# Patient Record
Sex: Male | Born: 1955 | Race: White | Hispanic: No | Marital: Married | Smoking: Former smoker
Health system: Southern US, Community
[De-identification: ages and names within clinical notes are randomized; demographics above are authoritative.]

## PROBLEM LIST (undated history)

## (undated) DIAGNOSIS — F32A Depression, unspecified: Secondary | ICD-10-CM

## (undated) DIAGNOSIS — E785 Hyperlipidemia, unspecified: Secondary | ICD-10-CM

## (undated) DIAGNOSIS — C8590 Non-Hodgkin lymphoma, unspecified, unspecified site: Principal | ICD-10-CM

## (undated) DIAGNOSIS — K219 Gastro-esophageal reflux disease without esophagitis: Secondary | ICD-10-CM

## (undated) DIAGNOSIS — K7460 Unspecified cirrhosis of liver: Secondary | ICD-10-CM

## (undated) DIAGNOSIS — K429 Umbilical hernia without obstruction or gangrene: Secondary | ICD-10-CM

## (undated) DIAGNOSIS — I739 Peripheral vascular disease, unspecified: Secondary | ICD-10-CM

## (undated) DIAGNOSIS — R519 Headache, unspecified: Secondary | ICD-10-CM

## (undated) DIAGNOSIS — F419 Anxiety disorder, unspecified: Secondary | ICD-10-CM

## (undated) DIAGNOSIS — G629 Polyneuropathy, unspecified: Secondary | ICD-10-CM

## (undated) DIAGNOSIS — I10 Essential (primary) hypertension: Secondary | ICD-10-CM

## (undated) DIAGNOSIS — I2510 Atherosclerotic heart disease of native coronary artery without angina pectoris: Secondary | ICD-10-CM

## (undated) DIAGNOSIS — R760 Raised antibody titer: Secondary | ICD-10-CM

## (undated) DIAGNOSIS — R202 Paresthesia of skin: Secondary | ICD-10-CM

## (undated) DIAGNOSIS — K5792 Diverticulitis of intestine, part unspecified, without perforation or abscess without bleeding: Secondary | ICD-10-CM

## (undated) DIAGNOSIS — Z950 Presence of cardiac pacemaker: Secondary | ICD-10-CM

## (undated) DIAGNOSIS — E119 Type 2 diabetes mellitus without complications: Secondary | ICD-10-CM

## (undated) DIAGNOSIS — I350 Nonrheumatic aortic (valve) stenosis: Secondary | ICD-10-CM

## (undated) DIAGNOSIS — I453 Trifascicular block: Secondary | ICD-10-CM

## (undated) DIAGNOSIS — I4891 Unspecified atrial fibrillation: Secondary | ICD-10-CM

## (undated) DIAGNOSIS — G4730 Sleep apnea, unspecified: Secondary | ICD-10-CM

## (undated) DIAGNOSIS — I509 Heart failure, unspecified: Secondary | ICD-10-CM

## (undated) DIAGNOSIS — M1990 Unspecified osteoarthritis, unspecified site: Secondary | ICD-10-CM

## (undated) DIAGNOSIS — K760 Fatty (change of) liver, not elsewhere classified: Secondary | ICD-10-CM

## (undated) DIAGNOSIS — I219 Acute myocardial infarction, unspecified: Secondary | ICD-10-CM

## (undated) DIAGNOSIS — G8929 Other chronic pain: Secondary | ICD-10-CM

## (undated) DIAGNOSIS — D696 Thrombocytopenia, unspecified: Secondary | ICD-10-CM

## (undated) DIAGNOSIS — D649 Anemia, unspecified: Secondary | ICD-10-CM

## (undated) DIAGNOSIS — Z95810 Presence of automatic (implantable) cardiac defibrillator: Secondary | ICD-10-CM

## (undated) HISTORY — DX: Presence of cardiac pacemaker: Z95.0

## (undated) HISTORY — DX: Unspecified cirrhosis of liver: K74.60

## (undated) HISTORY — DX: Non-Hodgkin lymphoma, unspecified, unspecified site: C85.90

## (undated) HISTORY — PX: BACK SURGERY: SHX140

## (undated) HISTORY — DX: Other chronic pain: G89.29

## (undated) HISTORY — PX: OTHER SURGICAL HISTORY: SHX169

## (undated) HISTORY — PX: ROUX-EN-Y GASTRIC BYPASS: SHX1104

## (undated) HISTORY — DX: Paresthesia of skin: R20.2

## (undated) HISTORY — DX: Thrombocytopenia, unspecified: D69.6

## (undated) HISTORY — DX: Atherosclerotic heart disease of native coronary artery without angina pectoris: I25.10

## (undated) HISTORY — DX: Unspecified atrial fibrillation: I48.91

## (undated) HISTORY — DX: Essential (primary) hypertension: I10

## (undated) HISTORY — PX: CATARACT EXTRACTION: SUR2

## (undated) HISTORY — DX: Hyperlipidemia, unspecified: E78.5

## (undated) HISTORY — DX: Polyneuropathy, unspecified: G62.9

## (undated) HISTORY — PX: SHOULDER ARTHROSCOPY: SHX128

## (undated) HISTORY — DX: Type 2 diabetes mellitus without complications: E11.9

## (undated) HISTORY — DX: Acute myocardial infarction, unspecified: I21.9

## (undated) HISTORY — PX: NECK SURGERY: SHX720

## (undated) HISTORY — PX: CARDIAC CATHETERIZATION: SHX172

## (undated) HISTORY — DX: Gastro-esophageal reflux disease without esophagitis: K21.9

## (undated) HISTORY — DX: Raised antibody titer: R76.0

## (undated) HISTORY — DX: Trifascicular block: I45.3

---

## 1999-01-07 ENCOUNTER — Inpatient Hospital Stay (HOSPITAL_COMMUNITY): Admission: AD | Admit: 1999-01-07 | Discharge: 1999-01-10 | Payer: Self-pay | Admitting: Cardiology

## 1999-05-20 DIAGNOSIS — I219 Acute myocardial infarction, unspecified: Secondary | ICD-10-CM

## 1999-05-20 HISTORY — DX: Acute myocardial infarction, unspecified: I21.9

## 1999-07-12 ENCOUNTER — Ambulatory Visit (HOSPITAL_COMMUNITY): Admission: RE | Admit: 1999-07-12 | Discharge: 1999-07-13 | Payer: Self-pay | Admitting: Cardiology

## 2000-03-08 ENCOUNTER — Inpatient Hospital Stay (HOSPITAL_COMMUNITY): Admission: EM | Admit: 2000-03-08 | Discharge: 2000-03-10 | Payer: Self-pay | Admitting: Cardiology

## 2001-01-20 ENCOUNTER — Ambulatory Visit (HOSPITAL_COMMUNITY): Admission: RE | Admit: 2001-01-20 | Discharge: 2001-01-20 | Payer: Self-pay | Admitting: Pulmonary Disease

## 2001-12-20 ENCOUNTER — Ambulatory Visit (HOSPITAL_COMMUNITY): Admission: RE | Admit: 2001-12-20 | Discharge: 2001-12-20 | Payer: Self-pay | Admitting: Pulmonary Disease

## 2003-08-23 ENCOUNTER — Ambulatory Visit (HOSPITAL_COMMUNITY): Admission: RE | Admit: 2003-08-23 | Discharge: 2003-08-23 | Payer: Self-pay | Admitting: Pulmonary Disease

## 2003-09-07 ENCOUNTER — Ambulatory Visit (HOSPITAL_COMMUNITY): Admission: RE | Admit: 2003-09-07 | Discharge: 2003-09-07 | Payer: Self-pay | Admitting: Pulmonary Disease

## 2004-05-23 ENCOUNTER — Ambulatory Visit: Payer: Self-pay | Admitting: Orthopedic Surgery

## 2004-06-24 ENCOUNTER — Ambulatory Visit: Payer: Self-pay | Admitting: Orthopedic Surgery

## 2005-01-15 ENCOUNTER — Ambulatory Visit (HOSPITAL_COMMUNITY): Admission: RE | Admit: 2005-01-15 | Discharge: 2005-01-15 | Payer: Self-pay | Admitting: Pulmonary Disease

## 2005-05-19 DIAGNOSIS — C8590 Non-Hodgkin lymphoma, unspecified, unspecified site: Secondary | ICD-10-CM

## 2005-05-19 HISTORY — DX: Non-Hodgkin lymphoma, unspecified, unspecified site: C85.90

## 2005-07-08 ENCOUNTER — Ambulatory Visit (HOSPITAL_COMMUNITY): Admission: RE | Admit: 2005-07-08 | Discharge: 2005-07-08 | Payer: Self-pay | Admitting: Pulmonary Disease

## 2005-07-21 ENCOUNTER — Ambulatory Visit (HOSPITAL_COMMUNITY): Admission: RE | Admit: 2005-07-21 | Discharge: 2005-07-21 | Payer: Self-pay | Admitting: General Surgery

## 2005-09-15 ENCOUNTER — Ambulatory Visit (HOSPITAL_COMMUNITY): Admission: RE | Admit: 2005-09-15 | Discharge: 2005-09-15 | Payer: Self-pay | Admitting: General Surgery

## 2005-09-15 ENCOUNTER — Encounter (INDEPENDENT_AMBULATORY_CARE_PROVIDER_SITE_OTHER): Payer: Self-pay | Admitting: *Deleted

## 2005-09-30 ENCOUNTER — Ambulatory Visit (HOSPITAL_COMMUNITY): Admission: RE | Admit: 2005-09-30 | Discharge: 2005-09-30 | Payer: Self-pay | Admitting: General Surgery

## 2005-10-06 ENCOUNTER — Ambulatory Visit (HOSPITAL_COMMUNITY): Payer: Self-pay | Admitting: Oncology

## 2005-10-06 ENCOUNTER — Encounter: Admission: RE | Admit: 2005-10-06 | Discharge: 2005-10-06 | Payer: Self-pay | Admitting: Oncology

## 2005-10-06 ENCOUNTER — Encounter (HOSPITAL_COMMUNITY): Admission: RE | Admit: 2005-10-06 | Discharge: 2005-11-05 | Payer: Self-pay | Admitting: Oncology

## 2005-10-10 ENCOUNTER — Ambulatory Visit (HOSPITAL_COMMUNITY): Admission: RE | Admit: 2005-10-10 | Discharge: 2005-10-10 | Payer: Self-pay | Admitting: Oncology

## 2005-11-20 ENCOUNTER — Encounter (HOSPITAL_COMMUNITY): Admission: RE | Admit: 2005-11-20 | Discharge: 2005-12-20 | Payer: Self-pay | Admitting: Oncology

## 2005-11-20 ENCOUNTER — Encounter: Admission: RE | Admit: 2005-11-20 | Discharge: 2005-11-20 | Payer: Self-pay | Admitting: Oncology

## 2005-12-30 ENCOUNTER — Encounter: Admission: RE | Admit: 2005-12-30 | Discharge: 2005-12-30 | Payer: Self-pay | Admitting: Oncology

## 2005-12-30 ENCOUNTER — Encounter (HOSPITAL_COMMUNITY): Admission: RE | Admit: 2005-12-30 | Discharge: 2006-01-29 | Payer: Self-pay | Admitting: Oncology

## 2005-12-30 ENCOUNTER — Ambulatory Visit (HOSPITAL_COMMUNITY): Payer: Self-pay | Admitting: Oncology

## 2006-03-02 ENCOUNTER — Encounter (HOSPITAL_COMMUNITY): Admission: RE | Admit: 2006-03-02 | Discharge: 2006-04-01 | Payer: Self-pay | Admitting: Oncology

## 2006-03-02 ENCOUNTER — Encounter: Admission: RE | Admit: 2006-03-02 | Discharge: 2006-03-02 | Payer: Self-pay | Admitting: Oncology

## 2006-03-02 ENCOUNTER — Ambulatory Visit (HOSPITAL_COMMUNITY): Payer: Self-pay | Admitting: Oncology

## 2006-04-28 ENCOUNTER — Ambulatory Visit: Payer: Self-pay | Admitting: Gastroenterology

## 2006-05-05 ENCOUNTER — Ambulatory Visit (HOSPITAL_COMMUNITY): Admission: RE | Admit: 2006-05-05 | Discharge: 2006-05-05 | Payer: Self-pay

## 2006-07-10 ENCOUNTER — Encounter (HOSPITAL_COMMUNITY): Admission: RE | Admit: 2006-07-10 | Discharge: 2006-08-09 | Payer: Self-pay | Admitting: Oncology

## 2006-07-10 ENCOUNTER — Ambulatory Visit (HOSPITAL_COMMUNITY): Payer: Self-pay | Admitting: Oncology

## 2006-10-01 ENCOUNTER — Encounter (HOSPITAL_COMMUNITY): Admission: RE | Admit: 2006-10-01 | Discharge: 2006-10-31 | Payer: Self-pay | Admitting: Oncology

## 2006-10-05 ENCOUNTER — Ambulatory Visit (HOSPITAL_COMMUNITY): Payer: Self-pay | Admitting: Oncology

## 2006-10-18 HISTORY — PX: ESOPHAGOGASTRODUODENOSCOPY: SHX1529

## 2006-10-18 HISTORY — PX: COLONOSCOPY: SHX174

## 2006-11-04 ENCOUNTER — Ambulatory Visit: Payer: Self-pay | Admitting: Cardiology

## 2006-11-10 ENCOUNTER — Ambulatory Visit: Payer: Self-pay | Admitting: Internal Medicine

## 2006-11-12 ENCOUNTER — Encounter (HOSPITAL_COMMUNITY): Admission: RE | Admit: 2006-11-12 | Discharge: 2006-12-12 | Payer: Self-pay | Admitting: Cardiology

## 2006-11-12 ENCOUNTER — Ambulatory Visit (HOSPITAL_COMMUNITY): Admission: RE | Admit: 2006-11-12 | Discharge: 2006-11-12 | Payer: Self-pay | Admitting: Internal Medicine

## 2006-11-12 ENCOUNTER — Ambulatory Visit: Payer: Self-pay | Admitting: Internal Medicine

## 2006-11-16 ENCOUNTER — Encounter (INDEPENDENT_AMBULATORY_CARE_PROVIDER_SITE_OTHER): Payer: Self-pay | Admitting: *Deleted

## 2006-11-16 LAB — CONVERTED CEMR LAB
ALT: 20 units/L
Albumin: 4.5 g/dL
Cholesterol: 94 mg/dL
Triglycerides: 151 mg/dL

## 2006-12-09 ENCOUNTER — Ambulatory Visit: Payer: Self-pay | Admitting: Cardiovascular Disease

## 2006-12-18 HISTORY — PX: ESOPHAGOGASTRODUODENOSCOPY: SHX1529

## 2007-02-08 ENCOUNTER — Encounter (HOSPITAL_COMMUNITY): Admission: RE | Admit: 2007-02-08 | Discharge: 2007-03-10 | Payer: Self-pay | Admitting: Oncology

## 2007-02-08 ENCOUNTER — Ambulatory Visit (HOSPITAL_COMMUNITY): Payer: Self-pay | Admitting: Oncology

## 2007-03-30 ENCOUNTER — Ambulatory Visit (HOSPITAL_COMMUNITY): Payer: Self-pay | Admitting: Oncology

## 2007-08-24 ENCOUNTER — Ambulatory Visit (HOSPITAL_COMMUNITY): Payer: Self-pay | Admitting: Oncology

## 2007-08-24 ENCOUNTER — Encounter (HOSPITAL_COMMUNITY): Admission: RE | Admit: 2007-08-24 | Discharge: 2007-09-23 | Payer: Self-pay | Admitting: Oncology

## 2008-02-22 ENCOUNTER — Ambulatory Visit (HOSPITAL_COMMUNITY): Payer: Self-pay | Admitting: Oncology

## 2008-02-22 ENCOUNTER — Encounter (HOSPITAL_COMMUNITY): Admission: RE | Admit: 2008-02-22 | Discharge: 2008-03-23 | Payer: Self-pay | Admitting: Oncology

## 2008-08-17 ENCOUNTER — Ambulatory Visit (HOSPITAL_COMMUNITY): Payer: Self-pay | Admitting: Oncology

## 2008-08-17 ENCOUNTER — Encounter (HOSPITAL_COMMUNITY): Admission: RE | Admit: 2008-08-17 | Discharge: 2008-09-16 | Payer: Self-pay | Admitting: Oncology

## 2008-10-28 ENCOUNTER — Emergency Department (HOSPITAL_COMMUNITY): Admission: EM | Admit: 2008-10-28 | Discharge: 2008-10-29 | Payer: Self-pay | Admitting: Emergency Medicine

## 2008-10-29 ENCOUNTER — Emergency Department (HOSPITAL_COMMUNITY): Admission: EM | Admit: 2008-10-29 | Discharge: 2008-10-29 | Payer: Self-pay | Admitting: Emergency Medicine

## 2008-10-29 ENCOUNTER — Ambulatory Visit (HOSPITAL_COMMUNITY): Admission: RE | Admit: 2008-10-29 | Discharge: 2008-10-29 | Payer: Self-pay | Admitting: Emergency Medicine

## 2009-02-22 ENCOUNTER — Encounter (HOSPITAL_COMMUNITY): Admission: RE | Admit: 2009-02-22 | Discharge: 2009-03-24 | Payer: Self-pay | Admitting: Oncology

## 2009-02-22 ENCOUNTER — Ambulatory Visit (HOSPITAL_COMMUNITY): Payer: Self-pay | Admitting: Oncology

## 2009-04-04 ENCOUNTER — Ambulatory Visit (HOSPITAL_COMMUNITY): Payer: Self-pay | Admitting: Oncology

## 2009-04-06 ENCOUNTER — Ambulatory Visit (HOSPITAL_COMMUNITY): Admission: RE | Admit: 2009-04-06 | Discharge: 2009-04-06 | Payer: Self-pay | Admitting: General Surgery

## 2009-04-10 ENCOUNTER — Encounter: Admission: RE | Admit: 2009-04-10 | Discharge: 2009-04-10 | Payer: Self-pay | Admitting: Orthopaedic Surgery

## 2009-05-22 ENCOUNTER — Ambulatory Visit: Payer: Self-pay | Admitting: Cardiology

## 2009-05-22 ENCOUNTER — Encounter (INDEPENDENT_AMBULATORY_CARE_PROVIDER_SITE_OTHER): Payer: Self-pay | Admitting: *Deleted

## 2009-05-22 DIAGNOSIS — I2510 Atherosclerotic heart disease of native coronary artery without angina pectoris: Secondary | ICD-10-CM | POA: Insufficient documentation

## 2009-05-22 DIAGNOSIS — E782 Mixed hyperlipidemia: Secondary | ICD-10-CM

## 2009-05-22 DIAGNOSIS — I10 Essential (primary) hypertension: Secondary | ICD-10-CM

## 2009-05-22 DIAGNOSIS — I25119 Atherosclerotic heart disease of native coronary artery with unspecified angina pectoris: Secondary | ICD-10-CM | POA: Insufficient documentation

## 2009-05-22 DIAGNOSIS — E1159 Type 2 diabetes mellitus with other circulatory complications: Secondary | ICD-10-CM | POA: Insufficient documentation

## 2009-05-30 ENCOUNTER — Ambulatory Visit: Payer: Self-pay | Admitting: Cardiology

## 2009-05-30 ENCOUNTER — Encounter: Payer: Self-pay | Admitting: Cardiology

## 2009-05-30 ENCOUNTER — Encounter (HOSPITAL_COMMUNITY): Admission: RE | Admit: 2009-05-30 | Discharge: 2009-06-29 | Payer: Self-pay | Admitting: Cardiology

## 2009-06-04 ENCOUNTER — Encounter: Payer: Self-pay | Admitting: Cardiology

## 2009-06-05 ENCOUNTER — Encounter: Payer: Self-pay | Admitting: Cardiology

## 2009-06-13 ENCOUNTER — Ambulatory Visit: Payer: Self-pay | Admitting: Cardiology

## 2009-06-29 ENCOUNTER — Ambulatory Visit (HOSPITAL_COMMUNITY): Admission: RE | Admit: 2009-06-29 | Discharge: 2009-06-30 | Payer: Self-pay | Admitting: Orthopaedic Surgery

## 2009-10-08 ENCOUNTER — Ambulatory Visit (HOSPITAL_COMMUNITY): Payer: Self-pay | Admitting: Oncology

## 2009-10-08 ENCOUNTER — Encounter (HOSPITAL_COMMUNITY): Admission: RE | Admit: 2009-10-08 | Discharge: 2009-11-07 | Payer: Self-pay | Admitting: Oncology

## 2009-10-31 ENCOUNTER — Encounter (HOSPITAL_COMMUNITY): Admission: RE | Admit: 2009-10-31 | Discharge: 2009-11-30 | Payer: Self-pay | Admitting: Oncology

## 2009-12-04 ENCOUNTER — Encounter (HOSPITAL_COMMUNITY): Admission: RE | Admit: 2009-12-04 | Discharge: 2010-01-03 | Payer: Self-pay | Admitting: Oncology

## 2009-12-19 ENCOUNTER — Ambulatory Visit: Payer: Self-pay | Admitting: Cardiology

## 2009-12-20 ENCOUNTER — Encounter: Payer: Self-pay | Admitting: Cardiology

## 2009-12-31 ENCOUNTER — Encounter: Admission: RE | Admit: 2009-12-31 | Discharge: 2010-03-31 | Payer: Self-pay | Admitting: Oncology

## 2009-12-31 ENCOUNTER — Encounter: Admission: RE | Admit: 2009-12-31 | Discharge: 2009-12-31 | Payer: Self-pay | Admitting: Oncology

## 2010-02-25 ENCOUNTER — Encounter
Admission: RE | Admit: 2010-02-25 | Discharge: 2010-02-25 | Payer: Self-pay | Source: Home / Self Care | Attending: Oncology | Admitting: Oncology

## 2010-04-19 ENCOUNTER — Encounter (HOSPITAL_COMMUNITY)
Admission: RE | Admit: 2010-04-19 | Discharge: 2010-05-19 | Payer: Self-pay | Source: Home / Self Care | Attending: Oncology | Admitting: Oncology

## 2010-04-19 ENCOUNTER — Ambulatory Visit (HOSPITAL_COMMUNITY): Payer: Self-pay | Admitting: Oncology

## 2010-05-07 ENCOUNTER — Encounter
Admission: RE | Admit: 2010-05-07 | Discharge: 2010-06-18 | Payer: Self-pay | Source: Home / Self Care | Attending: Oncology | Admitting: Oncology

## 2010-06-09 ENCOUNTER — Encounter (HOSPITAL_COMMUNITY): Payer: Self-pay | Admitting: Oncology

## 2010-06-16 LAB — CONVERTED CEMR LAB
Albumin: 4.3 g/dL (ref 3.5–5.2)
Bilirubin, Direct: 0.2 mg/dL (ref 0.0–0.3)
Bilirubin, Direct: 0.2 mg/dL (ref 0.0–0.3)
HDL: 39 mg/dL — ABNORMAL LOW (ref >39)
HDL: 41 mg/dL (ref >39)
Indirect Bilirubin: 0.4 mg/dL (ref 0.0–0.9)
Indirect Bilirubin: 0.6 mg/dL (ref 0.0–0.9)
LDL Cholesterol: 31 mg/dL (ref 0–99)
LDL Cholesterol: 39 mg/dL (ref 0–99)
Total Bilirubin: 0.6 mg/dL (ref 0.3–1.2)
Total Bilirubin: 0.8 mg/dL (ref 0.3–1.2)
Total CHOL/HDL Ratio: 2.8
Triglycerides: 196 mg/dL — ABNORMAL HIGH (ref <150)
VLDL: 36 mg/dL (ref 0–40)

## 2010-06-20 NOTE — Letter (Signed)
Summary: [name] at [employer]  [address], [place] [address]   Phone: [phone]  Fax: [phone]     December 19, 2009 MRN: [id number]   Kelly [name] [address] [place], [place]  [address]      YOUR LAB WORK IS DUE  The week of June 17, 2010 _________________________________________  Please go to [facility], located across the street from [facility] on the second floor.  Hours are Monday - Friday 7am until 7:30pm         Saturday 8am until 12noon    __  DO NOT EAT OR DRINK AFTER MIDNIGHT EVENING PRIOR TO LABWORK  __ YOUR LABWORK IS NOT FASTING --YOU MAY EAT PRIOR TO LABWORK

## 2010-06-20 NOTE — Assessment & Plan Note (Signed)
Summary: SURG CLEARANCE  Medications Added ACTOS 45 MG TABS (PIOGLITAZONE HCL) take 1 tab daily SIMVASTATIN 40 MG TABS (SIMVASTATIN) take 1 tab daily METFORMIN HCL 500 MG TABS (METFORMIN HCL) take 2 tabs two times a day GLYBURIDE 5 MG TABS (GLYBURIDE) take 2 tabs two times a day FUROSEMIDE 20 MG TABS (FUROSEMIDE) take 1 tab daily LOPRESSOR 50 MG TABS (METOPROLOL TARTRATE) take 1/2 two times a day PRILOSEC 20 MG CPDR (OMEPRAZOLE) take 1 tab two times a day ALPRAZOLAM 1 MG TABS (ALPRAZOLAM) take 2 tabs at bedtime ALDACTONE 50 MG TABS (SPIRONOLACTONE) take 1 tab two times a day RAMIPRIL 5 MG CAPS (RAMIPRIL) take 1 tab daily ASPIR-LOW 81 MG TBEC (ASPIRIN) take 1 tab daily NITROSTAT 0.4 MG SUBL (NITROGLYCERIN) take as needed      Allergies Added: NKDA  Visit Type:  Follow-up Referring Provider:  Dr. Annell Greening Primary Provider:  Dr. Kari Baars   History of Present Illness: This is my first meeting with Kelly [name]. He is a 55 year old male with complex medical illness outlined below, including cardiovascular disease, and remote percutaneous intervention. He has been followed over the years in our office by Dr. Jens Som and Dr. Eden Emms, although not seen since 2008.  The patient is being considered for left rotator cuff surgery under general anesthesia and is referred for a preoperative assessment.  Kelly [name] indicates that over the last 6 months he has had less energy and increased fatigue. He does not report any typical exertional chest pain, only "2" brief episodes of chest pain of atypical description, sporadic overall. She reports compliance with his medications which are outlined below. He has not had regular followup with Dr. Juanetta Gosling, and cannot recall his last lipid profile.  Kelly [name] is followed by Dr. Mariel Sleet with a history of lymphoma, not requiring any specific therapy by his report. The patient also has a history of left lower extremity deep venous thrombosis in June of  2010, transiently treated with Coumadin. The patient's understanding is that he may require Coumadin long-term at some point.  Preventive Screening-Counseling & Management  Alcohol-Tobacco     Smoking Status: quit  Current Medications (verified): 1)  Actos 45 Mg Tabs (Pioglitazone Hcl) .... Take 1 Tab Daily 2)  Simvastatin 40 Mg Tabs (Simvastatin) .... Take 1 Tab Daily 3)  Metformin Hcl 500 Mg Tabs (Metformin Hcl) .... Take 2 Tabs Two Times A Day 4)  Glyburide 5 Mg Tabs (Glyburide) .... Take 2 Tabs Two Times A Day 5)  Furosemide 20 Mg Tabs (Furosemide) .... Take 1 Tab Daily 6)  Lopressor 50 Mg Tabs (Metoprolol Tartrate) .... Take 1/2 Two Times A Day 7)  Prilosec 20 Mg Cpdr (Omeprazole) .... Take 1 Tab Two Times A Day 8)  Alprazolam 1 Mg Tabs (Alprazolam) .... Take 2 Tabs At Bedtime 9)  Aldactone 50 Mg Tabs (Spironolactone) .... Take 1 Tab Two Times A Day 10)  Ramipril 5 Mg Caps (Ramipril) .... Take 1 Tab Daily 11)  Aspir-Low 81 Mg Tbec (Aspirin) .... Take 1 Tab Daily 12)  Nitrostat 0.4 Mg Subl (Nitroglycerin) .... Take As Needed  Allergies (verified): No Known Drug Allergies  Past History:  Past Medical History: Last updated: 05/22/2009 CAD - BMS LAD and PTCA RCA 2000, BMS RCA/PLA 2001 Hypertension Diabetes Type 2 Myocardial Infarction - 2001 Hyperlipidemia Non-Hodgkin's lymphoma G E R D  Past Surgical History: Last updated: 05/22/2009 Right inguinal lymph node biopsy Multiple back surgeries Neck surgeries Incision and drainage left axillary abscess  Clinical  Review Panels:  Nuclear Stress Testing Nuclear Stress Test Findings  IMPRESSION:   1.  Adenosine Myoview electrically negative for ischemia.  Myoview   scan with small areas of ischemia in the distal   inferior/inferolateral region as well as basal inferoseptal region.   Inferior changes also reflect scar and some possible soft tissue   attenuation.  LVF on gating calculated at 50%.   2.  On comparison to  Myoview report from 2001, does not appear to be   significantly different, but images are not available for comparison. (11/12/2006)  Cardiac Imaging Cardiac Cath Findings CONCLUSIONS: 1. Coronary artery disease, status post prior stenting of the proximal    left anterior descending and prior percutaneous transluminal coronary    angioplasty of the right coronary artery in August of 2000 with 30%    narrowing in the stent site in the left anterior descending and 40%    proximal narrowing in the native left anterior descending, 30% narrowing    within the proximal stents in the right coronary artery, 70% stenosis in the  mid right coronary artery (new), 80% narrowing in the ostium of the    posterior descending branch (old), and 90% restenosis in the second    posterolateral branch of the right coronary artery with 70% stenosis in the    first posterolateral branch and normal left ventricular function. 2. Successful stenting of the mid right coronary artery with improvement in    percent diameter narrowing from 70% to less than 10%. 3. Successful stenting of the first posterolateral branch of the right     coronary artery with improvement in the percent diameter narrowing from    70% to 10% and suboptimal result in the second posterolateral branch    which is a side branch off the first posterolateral branch with improvement    in percent diameter narrowing from 90% to 50%.  DISPOSITION:  The patient was returned to the postangioplasty for further observation. DD:  07/12/99 TD:  07/13/99 Job: [id number] (07/12/1999)    Family History: Noncontributory  Social History: Married  Tobacco Use - Former Alcohol Use - no Smoking Status:  quit  Review of Systems       The patient complains of weight gain, dyspnea on exertion, and peripheral edema.  The patient denies syncope, prolonged cough, headaches, hemoptysis, abdominal pain, melena, hematochezia, and severe  indigestion/heartburn.         He does not exercise regularly at this point, complaining of chronic knee pain. He wears compression hose below the knees bilaterally on a regular basis. No palpitations or syncope. No orthopnea or PND. No fevers or chills. Otherwise reviewed and negative.  Vital Signs:  Patient profile:   55 year old male Height:      76 inches Weight:      308 pounds BMI:     37.63 Pulse rate:   72 / minute BP sitting:   124 / 74  (right arm)  Vitals Entered By: Dreama Saa, CNA (May 22, 2009 12:46 PM)  Physical Exam  Additional Exam:  Morbidly obese male in no acute distress. HEENT: Conjunctiva and lids normal, oropharynx with poor dentition. Neck: Increased girth, no obvious elevated jugular venous pressure or carotid bruits. No thyromegaly. Lungs: Clear to auscultation, nonlabored. Cardiac: Indistinct PMI, regular rate and rhythm, soft apical systolic murmur. No S3 gallop. Abdomen: Obese, unable to palpate liver edge, bowel sounds present, no bruits. Extremities: Compression hose in place bilaterally 1+ distal pulses.  Skin: Warm and dry. Musculoskeletal: No gross deformities. Neuropsychiatric: Alert and oriented x3, affect appropriate.   EKG  Procedure date:  05/22/2009  Findings:      Probable sinus rhythm at 71 beats per minute with right bundle branch block and left anterior fascicular block. No old tracing available for comparison at this time.  Impression & Recommendations:  Problem # 1:  PREOPERATIVE EXAMINATION (ICD-V72.84)  Patient being considered for elective left rotator cuff surgery under general anesthesia. He has a known cardiovascular history as detailed above. Last ischemic evaluation was over 2 years ago, and symptomatically he has been experiencing progressive fatigue and weakness over the last 6 months, although with very atypical, infrequent chest pain. His electrocardiogram is abnormal with right bundle branch block and left  anterior fascicular block (no old tracing at hand). We have discussed the situation and I have recommended a followup echocardiogram as well as Lexiscan Myoview for further risk stratification. I will bring him back to the office to review these studies.  Problem # 2:  CORONARY ATHEROSCLEROSIS NATIVE CORONARY ARTERY (ICD-414.01)  Remote history of myocardial infarction in 2001 with previous intervention including bare-metal stent placement to the LAD and angioplasty to the RCA in 2000, followed by bare-metal stent placement to the RCA and PLB in 2001. Myoview 2008 revealed mild ischemia in the inferolateral and inferoseptal wall with ejection fraction of 50%, managed medically at that time. Medical therapy looks fairly reasonable at this point.  His updated medication list for this problem includes:    Lopressor 50 Mg Tabs (Metoprolol tartrate) .Marland Kitchen... Take 1/2 two times a day    Ramipril 5 Mg Caps (Ramipril) .Marland Kitchen... Take 1 tab daily    Aspir-low 81 Mg Tbec (Aspirin) .Marland Kitchen... Take 1 tab daily    Nitrostat 0.4 Mg Subl (Nitroglycerin) .Marland Kitchen... Take as needed  Orders: 2-D Echocardiogram (2D Echo) Nuclear Stress Test (Nuc Stress Test)  Problem # 3:  HYPERTENSION (ICD-401.9)  Blood pressure well controlled today.  His updated medication list for this problem includes:    Furosemide 20 Mg Tabs (Furosemide) .Marland Kitchen... Take 1 tab daily    Lopressor 50 Mg Tabs (Metoprolol tartrate) .Marland Kitchen... Take 1/2 two times a day    Aldactone 50 Mg Tabs (Spironolactone) .Marland Kitchen... Take 1 tab two times a day    Ramipril 5 Mg Caps (Ramipril) .Marland Kitchen... Take 1 tab daily    Aspir-low 81 Mg Tbec (Aspirin) .Marland Kitchen... Take 1 tab daily  Orders: 2-D Echocardiogram (2D Echo) Nuclear Stress Test (Nuc Stress Test)  Problem # 4:  HYPERLIPIDEMIA (ICD-272.4)  No recent followup. We will plan liver function tests and fasting lipid profile. He continues on simvastatin.  His updated medication list for this problem includes:    Simvastatin 40 Mg Tabs  (Simvastatin) .Marland Kitchen... Take 1 tab daily  Orders: T-Hepatic Function [id number]) T-Lipid Profile ([id number])  Problem # 5:  DIABETES MELLITUS, TYPE II (ICD-250.00)  I've recommended more regular followup with primary care physician.  His updated medication list for this problem includes:    Actos 45 Mg Tabs (Pioglitazone hcl) .Marland Kitchen... Take 1 tab daily    Metformin Hcl 500 Mg Tabs (Metformin hcl) .Marland Kitchen... Take 2 tabs two times a day    Glyburide 5 Mg Tabs (Glyburide) .Marland Kitchen... Take 2 tabs two times a day    Ramipril 5 Mg Caps (Ramipril) .Marland Kitchen... Take 1 tab daily    Aspir-low 81 Mg Tbec (Aspirin) .Marland Kitchen... Take 1 tab daily  Patient Instructions: 1)  Your physician recommends that you  schedule a follow-up appointment in: 3 weeks 2)  Your physician recommends that you return for a FASTING lipid profile: this week 3)  Your physician recommends that you continue on your current medications as directed. Please refer to the Current Medication list given to you today. 4)  Your physician has requested that you have an echocardiogram.  Echocardiography is a painless test that uses sound waves to create images of your heart. It provides your doctor with information about the size and shape of your heart and how well your heart's chambers and valves are working.  This procedure takes approximately one hour. There are no restrictions for this procedure. 5)  Your physician has requested that you have an [employer]  For further information please visit [email or web address]  Please follow instruction sheet, as given.

## 2010-06-20 NOTE — Assessment & Plan Note (Signed)
Summary: 6 MTH F/U PER CHECKOUT ON 06/13/09/TG  Medications Added LANTUS 100 UNIT/ML SOLN (INSULIN GLARGINE) 25 UNITS 1 TIME DAILY      Allergies Added: NKDA  Visit Type:  Follow-up Primary Provider:  Dr. Kari Baars   History of Present Illness: 55 year old male presents for followup. He reports no problems with significant angina or unusual breathlessness. He continues to struggle with weight control. He plans to speak with Dr. Laurie Panda in the near futur about weight loss measures, which he had previously recommended. He is reluctant to consider gastric bypass or lap banding related to cost.  Followup labs from 11 July showed cholesterol 109, triglycerides 196, HDL 39, LDL 31, AST 15, ALT 18. Statin dosing was reduced.  Recent ischemic testing is reviewed below.  Current Medications (verified): 1)  Actos 45 Mg Tabs (Pioglitazone Hcl) .... Take 1 Tab Daily 2)  Simvastatin 10 Mg Tabs (Simvastatin) .... Take 1 Tablet By Mouth At Bedtime 3)  Metformin Hcl 500 Mg Tabs (Metformin Hcl) .... Take 2 Tabs Two Times A Day 4)  Glyburide 5 Mg Tabs (Glyburide) .... Take 2 Tabs Two Times A Day 5)  Furosemide 20 Mg Tabs (Furosemide) .... Take 1 Tab Daily 6)  Lopressor 50 Mg Tabs (Metoprolol Tartrate) .... Take 1/2 Two Times A Day 7)  Prilosec 20 Mg Cpdr (Omeprazole) .... Take 1 Tab Two Times A Day 8)  Alprazolam 1 Mg Tabs (Alprazolam) .... Take 2 Tabs At Bedtime 9)  Aldactone 50 Mg Tabs (Spironolactone) .... Take 1 Tab Two Times A Day 10)  Ramipril 5 Mg Caps (Ramipril) .... Take 1 Tab Daily 11)  Aspir-Low 81 Mg Tbec (Aspirin) .... Take 1 Tab Daily 12)  Nitrostat 0.4 Mg Subl (Nitroglycerin) .... Take As Needed 13)  Lantus 100 Unit/ml Soln (Insulin Glargine) .... 25 Units 1 Time Daily  Allergies (verified): No Known Drug Allergies  Past History:  Past Medical History: Last updated: 05/22/2009 CAD - BMS LAD and PTCA RCA 2000, BMS RCA/PLA 2001 Hypertension Diabetes Type 2 Myocardial  Infarction - 2001 Hyperlipidemia Non-Hodgkin's lymphoma G E R D  Social History: Last updated: 05/22/2009 Married  Tobacco Use - Former Alcohol Use - no  Review of Systems  The patient denies anorexia, fever, weight loss, syncope, dyspnea on exertion, peripheral edema, prolonged cough, melena, and hematochezia.         Otherwise reviewed and negative.  Vital Signs:  Patient profile:   55 year old male Weight:      316 pounds BMI:     38.60 Pulse rate:   83 / minute BP sitting:   136 / 78  (right arm)  Vitals Entered By: Dreama Saa, CNA (December 19, 2009 3:29 PM)  Physical Exam  Additional Exam:  Morbidly obese male in no acute distress. HEENT: Conjunctiva and lids normal, oropharynx with poor dentition. Neck: Increased girth, no obvious elevated jugular venous pressure or carotid bruits. No thyromegaly. Lungs: Clear to auscultation, nonlabored. Cardiac: Indistinct PMI, regular rate and rhythm, soft apical systolic murmur. No S3 gallop. Abdomen: Obese, unable to palpate liver edge, bowel sounds present, no bruits. Extremities: Compression hose in place bilaterally 1+ distal pulses. Skin: Warm and dry. Musculoskeletal: No gross deformities. Neuropsychiatric: Alert and oriented x3, affect appropriate.   Nuclear Study  Procedure date:  05/30/2009  Findings:       Scintigraphic Data: Acquisition notable for the patient maintaining   both arms at his sides.  This significantly impaired image quality.   The left  ventricle was mildly dilated.  On tomographic images   reconstructed in standard planes, there was a small inferior wall   defect of mild to moderate intensity extending inferoapically.  By   comparison to the resting portion of the study, some reversibility   was appreciated in the basilar and mid portions of this area of   hypoperfusion.    The gated reconstruction demonstrated mild to moderate inferior   wall hypokinesis with an dyssynchrony of the  ventricular   contraction pattern.  Overall LV systolic function was normal with   an estimated ejection fraction of 53%.  There was normal systolic   accentuation of activity throughout.    IMPRESSION:   Abnormal  pharmacologic stress nuclear myocardial study revealing   no significant stress-induced EKG abnormalities, mild left   ventricular dilatation and normal overall LV systolic function with   a small segmental wall motion abnormality as described.  By   scintigraphic imaging, there was inferior wall scarring with some   superimposed ischemia.  Other findings as noted.  EKG  Procedure date:  12/19/2009  Findings:      Sinus rhythm at 75 beats per minute with prolonged PR interval of 218 ms, left ventricular hypertrophy, left anterior fascicular block.  Impression & Recommendations:  Problem # 1:  CORONARY ATHEROSCLEROSIS NATIVE CORONARY ARTERY (ICD-414.01)  Symptomatically stable on medical therapy. Anticipate followup in 6 months.  His updated medication list for this problem includes:    Lopressor 50 Mg Tabs (Metoprolol tartrate) .Marland Kitchen... Take 1/2 two times a day    Ramipril 5 Mg Caps (Ramipril) .Marland Kitchen... Take 1 tab daily    Aspir-low 81 Mg Tbec (Aspirin) .Marland Kitchen... Take 1 tab daily    Nitrostat 0.4 Mg Subl (Nitroglycerin) .Marland Kitchen... Take as needed  Problem # 2:  HYPERLIPIDEMIA (ICD-272.4)  Plan to continue low-dose simvastatin at 10 mg daily with followup lipid profile and liver function tests prior to his next visit.  His updated medication list for this problem includes:    Simvastatin 10 Mg Tabs (Simvastatin) .Marland Kitchen... Take 1 tablet by mouth at bedtime  Future Orders: T-Lipid Profile ([id number]) ... 06/17/2010 T-Hepatic Function [id number]) ... 06/17/2010  Problem # 3:  HYPERTENSION (ICD-401.9)  Blood pressure is reasonably well controlled today.  His updated medication list for this problem includes:    Furosemide 20 Mg Tabs (Furosemide) .Marland Kitchen... Take 1 tab daily     Lopressor 50 Mg Tabs (Metoprolol tartrate) .Marland Kitchen... Take 1/2 two times a day    Aldactone 50 Mg Tabs (Spironolactone) .Marland Kitchen... Take 1 tab two times a day    Ramipril 5 Mg Caps (Ramipril) .Marland Kitchen... Take 1 tab daily    Aspir-low 81 Mg Tbec (Aspirin) .Marland Kitchen... Take 1 tab daily  Patient Instructions: 1)  Your physician recommends that you return for lab work in: 6 months, just before next visit 2)  Your physician recommends that you continue on your current medications as directed. Please refer to the Current Medication list given to you today. 3)  Your physician recommends that you schedule a follow-up appointment in: 6 months

## 2010-06-20 NOTE — Letter (Signed)
Summary: [facility] (Nuc Med Stress)  [facility]  [address], [place] [address]   Phone: [phone]  Fax: [phone]    Nuclear Medicine 1-Day Stress Test Information Sheet  Re:     Kelly [name]   DOB:     05/31/1955 MRN:     [id number] Weight:  Appointment Date: Register at: Appointment Time: Referring MD:  ___Exercise Stress  __Adenosine   __Dobutamine  _X_Lexiscan  __Persantine   __Thallium  Urgency: ____1 (next day)   ____2 (one week)    ____3 (PRN)  Patient will receive Follow Up call with results: Patient needs follow-up appointment:  Instructions regarding medication:  How to prepare for your stress test: 1. DO NOT eat or dring 6 hours prior to your arrival time. This includes no caffeine (coffee, tea, sodas, chocolate) if you were instructed to take your medications, drink water with it. 2. DO NOT use any tobacco products for at leaset 8 hours prior to arrival. 3. DO NOT wear dresses or any clothing that may have metal clasps or buttons. 4. Wear short sleeve shirts, loose clothing, and comfortalbe walking shoes. 5. DO NOT use lotions, oils or powder on your chest before the test. 6. The test will take approximately 3-4 hours from the time you arrive until completion. 7. To register the day of the test, go to the Short Stay entrance at [facility]. 8. If you must cancel your test, call [phone] as soon as you are aware.  After you arrive for test:   When you arrive at [facility], you will go to Short Stay to be registered. They will then send you to Radiology to check in. The Nuclear Medicine Tech will get you and start an IV in your arm or hand. A small amount of a radioactive tracer will then be injected into your IV. This tracer will then have to circulate for 30-45 minutes. During this time you will wait in the waiting room and you will be able to drink something without caffeine. A series of pictures will be taken  of your heart follwoing this waiting period. After the 1st set of pictures you will go to the stress lab to get ready for your stress test. During the stress test, another small amount of a radioactive tracer will be injected through your IV. When the stress test is complete, there is a short rest period while your heart rate and blood pressure will be monitored. When this monitoring period is complete you will have another set of pictrues taken. (The same as the 1st set of pictures). These pictures are taken between 15 minutes and 1 hour after the stress test. The time depends on the type of stress test you had. Your doctor will inform you of your test results within 7 days after test.    The possibilities of certain changes are possible during the test. They include abnormal blood pressure and disorders of the heart. Side effects of persantine or adenosine can include flushing, chest pain, shortness of breath, stomach tightness, headache and light-headedness. These side effects usually do not last long and are self-resolving. Every effort will be made to keep you comfortable and to minimize complications by obtaining a medical history and by close observation during the test. Emergency equipment, medications, and trained personnel are available to deal with any unusual situation which may arise.  Please notify office at least 48 hours in advance if you are unable to keep  this appt.

## 2010-06-20 NOTE — Assessment & Plan Note (Signed)
Summary: 3 WK F/U PER CHECKOUT ON 05/22/09/TG      Allergies Added: NKDA  Visit Type:  Follow-up Referring Provider:  Dr. Annell Greening Primary Provider:  Dr. Kari Baars   History of Present Illness: 55 year old male presents for a followup visit. He was seen recently for a preoperative evaluation. Symptomatically he remains stable, mainly complaining of fatigue, but no frank angina. Referred him for the studies outlined below.  Echocardiography revealed an LVEF of 50-55%, mildly calcified aortic annulus without stenosis, and trivial tricuspid regurgitation. Lexiscan Myoview demonstrated an LVEF of 53% with inferior hypokinesis, and evidence of inferior wall scar with mild peri-infarct ischemia. This is fairly similar to his prior testing in 2008. Lipid profile revealed an LDL of 39, HDL 41, triglycerides 179, and total cholesterol 116. AST and ALT were normal. We recommended a reduction in simvastatin to 20 mg daily.  Today I reviewed the study results the patient and his wife. I suspect that he has had some progressive atherosclerosis over the last decade. His testing would suggest that this is most likely in the RCA distribution, however he had no anterior ischemia to suggest significant compromise of his LAD stent. His LVEF remains stable compared to prior studies.  Current Medications (verified): 1)  Actos 45 Mg Tabs (Pioglitazone Hcl) .... Take 1 Tab Daily 2)  Simvastatin 40 Mg Tabs (Simvastatin) .... Take 1/2  Tab Daily 3)  Metformin Hcl 500 Mg Tabs (Metformin Hcl) .... Take 2 Tabs Two Times A Day 4)  Glyburide 5 Mg Tabs (Glyburide) .... Take 2 Tabs Two Times A Day 5)  Furosemide 20 Mg Tabs (Furosemide) .... Take 1 Tab Daily 6)  Lopressor 50 Mg Tabs (Metoprolol Tartrate) .... Take 1/2 Two Times A Day 7)  Prilosec 20 Mg Cpdr (Omeprazole) .... Take 1 Tab Two Times A Day 8)  Alprazolam 1 Mg Tabs (Alprazolam) .... Take 2 Tabs At Bedtime 9)  Aldactone 50 Mg Tabs (Spironolactone) .... Take  1 Tab Two Times A Day 10)  Ramipril 5 Mg Caps (Ramipril) .... Take 1 Tab Daily 11)  Aspir-Low 81 Mg Tbec (Aspirin) .... Take 1 Tab Daily 12)  Nitrostat 0.4 Mg Subl (Nitroglycerin) .... Take As Needed  Allergies (verified): No Known Drug Allergies  Past History:  Past Medical History: Last updated: 05/22/2009 CAD - BMS LAD and PTCA RCA 2000, BMS RCA/PLA 2001 Hypertension Diabetes Type 2 Myocardial Infarction - 2001 Hyperlipidemia Non-Hodgkin's lymphoma G E R D  Social History: Last updated: 05/22/2009 Married  Tobacco Use - Former Alcohol Use - no  Review of Systems  The patient denies anorexia, fever, chest pain, syncope, peripheral edema, prolonged cough, headaches, hemoptysis, melena, and hematochezia.         Otherwise reviewed and negative.  Vital Signs:  Patient profile:   55 year old male Weight:      309 pounds Pulse rate:   76 / minute BP sitting:   116 / 71  (right arm)  Vitals Entered By: Dreama Saa, CNA (June 13, 2009 2:50 PM)  Physical Exam  Additional Exam:  Morbidly obese male in no acute distress. HEENT: Conjunctiva and lids normal, oropharynx with poor dentition. Neck: Increased girth, no obvious elevated jugular venous pressure or carotid bruits. No thyromegaly. Lungs: Clear to auscultation, nonlabored. Cardiac: Indistinct PMI, regular rate and rhythm, soft apical systolic murmur. No S3 gallop. Abdomen: Obese, unable to palpate liver edge, bowel sounds present, no bruits. Extremities: Compression hose in place bilaterally 1+ distal pulses. Skin: Warm and  dry. Musculoskeletal: No gross deformities. Neuropsychiatric: Alert and oriented x3, affect appropriate.   Impression & Recommendations:  Problem # 1:  PREOPERATIVE EXAMINATION (ICD-V72.84)  Basal above testing, symptom review, and medical regimen, I suspect that Mr. Keo should be able to proceed with planned surgery at an acceptable perioperative cardiac risk.  Problem # 2:   CORONARY ATHEROSCLEROSIS NATIVE CORONARY ARTERY (ICD-414.01)  At this point plan to continue medical therapy, with office followup arranged in the next 6 months. If symptoms of angina or progressive shortness of breath intervene, we can see him sooner. I have recommended weight loss and a basic walking regimen.  His updated medication list for this problem includes:    Lopressor 50 Mg Tabs (Metoprolol tartrate) .Marland Kitchen... Take 1/2 two times a day    Ramipril 5 Mg Caps (Ramipril) .Marland Kitchen... Take 1 tab daily    Aspir-low 81 Mg Tbec (Aspirin) .Marland Kitchen... Take 1 tab daily    Nitrostat 0.4 Mg Subl (Nitroglycerin) .Marland Kitchen... Take as needed  Problem # 3:  HYPERTENSION (ICD-401.9)  Blood pressure well controlled today.  His updated medication list for this problem includes:    Furosemide 20 Mg Tabs (Furosemide) .Marland Kitchen... Take 1 tab daily    Lopressor 50 Mg Tabs (Metoprolol tartrate) .Marland Kitchen... Take 1/2 two times a day    Aldactone 50 Mg Tabs (Spironolactone) .Marland Kitchen... Take 1 tab two times a day    Ramipril 5 Mg Caps (Ramipril) .Marland Kitchen... Take 1 tab daily    Aspir-low 81 Mg Tbec (Aspirin) .Marland Kitchen... Take 1 tab daily  Future Orders: T-Lipid Profile ([id number]) ... 11/26/2009 T-Hepatic Function [id number]) ... 11/26/2009  Problem # 4:  HYPERLIPIDEMIA (ICD-272.4)  Continue present dose of simvastatin, with followup lipid profile and liver function tests in 6 months.  His updated medication list for this problem includes:    Simvastatin 40 Mg Tabs (Simvastatin) .Marland Kitchen... Take 1/2  tab daily  Patient Instructions: 1)  Your physician recommends that you schedule a follow-up appointment in: 6 months 2)  Your physician recommends that you return for lab work in: 6 months 3)  Your physician recommends that you continue on your current medications as directed. Please refer to the Current Medication list given to you today.

## 2010-06-20 NOTE — Miscellaneous (Signed)
Summary: labs lipid,liver 12/16/2006  Clinical Lists Changes  Observations: Added new observation of ALBUMIN: 4.5 g/dL (04/54/0981 1:91) Added new observation of PROTEIN, TOT: 7.1 g/dL (47/82/9562 1:30) Added new observation of SGPT (ALT): 20 units/L (11/16/2006 8:20) Added new observation of SGOT (AST): 18 units/L (11/16/2006 8:20) Added new observation of ALK PHOS: 60 units/L (11/16/2006 8:20) Added new observation of BILI DIRECT: 0.2 mg/dL (86/57/8469 6:29) Added new observation of LDL: 24 mg/dL (52/84/1324 4:01) Added new observation of HDL: 40 mg/dL (02/72/5366 4:40) Added new observation of TRIGLYC TOT: 151 mg/dL (34/74/2595 6:38) Added new observation of CHOLESTEROL: 94 mg/dL (75/64/3329 5:18)

## 2010-06-20 NOTE — Letter (Signed)
Summary: [place] Results Engineer, agricultural at [facility]  [address], [place] [address]   Phone: [phone]  Fax: [phone]      June 05, 2009 MRN: [id number]   Kelly [name] [address] [place], [place]  [address]   Dear Mr. Delgreco,  Your test ordered by Selena Batten has been reviewed by your physician (or physician assistant) and was found to be normal or stable. Your physician (or physician assistant) felt no changes were needed at this time.  ____ Echocardiogram  __X__ Cardiac Stress Test  ____ Lab Work  ____ Peripheral vascular study of arms, legs or neck  ____ CT scan or X-ray  ____ Lung or Breathing test  ____ Other: Please continue on current medical treatment.  Thank you.  Nona Dell, MD, F.A.C.C

## 2010-06-20 NOTE — Letter (Signed)
Summary: External Correspondence  External Correspondence   Imported By: Faythe Ghee 06/04/2009 11:41:29  _____________________________________________________________________  External Attachment:    Type:   Image     Comment:   External Document

## 2010-06-20 NOTE — Letter (Signed)
Summary: [place] [facility] Work Engineer, agricultural at [facility]  [address] [place] [address]   Phone: [phone]  Fax: [phone]     June 13, 2009 MRN: [id number]   Kelly [name] [address] [place], [place]  [address]      YOUR LAB WORK IS DUE  November 26, 2009 _________________________________________  Please go to [facility], located across the street from [facility] on the second floor.  Hours are Monday - Friday 7am until 7:30pm         Saturday 8am until 12noon    _X_  DO NOT EAT OR DRINK AFTER MIDNIGHT EVENING PRIOR TO LABWORK  __ YOUR LABWORK IS NOT FASTING --YOU MAY EAT PRIOR TO LABWORK

## 2010-07-02 ENCOUNTER — Encounter: Payer: Self-pay | Admitting: Cardiology

## 2010-07-02 LAB — CONVERTED CEMR LAB
AST: 18 units/L (ref 0–37)
Albumin: 4.2 g/dL (ref 3.5–5.2)
Alkaline Phosphatase: 48 units/L (ref 39–117)
Bilirubin, Direct: 0.2 mg/dL (ref 0.0–0.3)
Indirect Bilirubin: 0.4 mg/dL (ref 0.0–0.9)
LDL Cholesterol: 51 mg/dL (ref 0–99)
Total Protein: 7 g/dL (ref 6.0–8.3)
VLDL: 24 mg/dL (ref 0–40)

## 2010-07-10 NOTE — Letter (Signed)
Summary: [place] Results Engineer, agricultural at [facility]  [address], [place] [address]   Phone: [phone]  Fax: [phone]      July 02, 2010 MRN: [id number]   Marlen Victorian [address] [place], [place]  [address]   Dear Mr. Teel,  Your test ordered by Selena Batten has been reviewed by your physician (or physician assistant) and was found to be normal or stable. Your physician (or physician assistant) felt no changes were needed at this time.  ____ Echocardiogram  ____ Cardiac Stress Test  __X__ Lab Work  ____ Peripheral vascular study of arms, legs or neck  ____ CT scan or X-ray  ____ Lung or Breathing test  ____ Other:  Please continue on current medical treatment.  Thank you.   Nona Dell, MD, F.A.C.C

## 2010-07-29 LAB — COMPREHENSIVE METABOLIC PANEL
ALT: 26 U/L (ref 0–53)
Albumin: 3.9 g/dL (ref 3.5–5.2)
BUN: 18 mg/dL (ref 6–23)
Calcium: 9.7 mg/dL (ref 8.4–10.5)
Chloride: 101 mEq/L (ref 96–112)
GFR calc non Af Amer: >60 mL/min (ref >60)
Potassium: 4.5 mEq/L (ref 3.5–5.1)
Total Bilirubin: 0.5 mg/dL (ref 0.3–1.2)

## 2010-07-29 LAB — CBC
Hemoglobin: 13.6 g/dL (ref 13.0–17.0)
Platelets: 155 10*3/uL (ref 150–400)
RDW: 12.3 % (ref 11.5–15.5)
WBC: 8.4 10*3/uL (ref 4.0–10.5)

## 2010-07-29 LAB — LACTATE DEHYDROGENASE: LDH: 137 U/L (ref 94–250)

## 2010-07-29 LAB — DIFFERENTIAL
Eosinophils Absolute: 0.1 10*3/uL (ref 0.0–0.7)
Lymphocytes Relative: 17 % (ref 12–46)
Lymphs Abs: 1.5 10*3/uL (ref 0.7–4.0)
Neutro Abs: 6.2 10*3/uL (ref 1.7–7.7)
Neutrophils Relative %: 73 % (ref 43–77)

## 2010-08-05 LAB — COMPREHENSIVE METABOLIC PANEL
Albumin: 3.8 g/dL (ref 3.5–5.2)
Alkaline Phosphatase: 49 U/L (ref 39–117)
BUN: 20 mg/dL (ref 6–23)
Chloride: 102 mEq/L (ref 96–112)
Potassium: 4.7 mEq/L (ref 3.5–5.1)
Total Bilirubin: 0.4 mg/dL (ref 0.3–1.2)

## 2010-08-05 LAB — CBC
HCT: 36.1 % — ABNORMAL LOW (ref 39.0–52.0)
Hemoglobin: 13 g/dL (ref 13.0–17.0)
Platelets: 128 10*3/uL — ABNORMAL LOW (ref 150–400)
RBC: 3.75 MIL/uL — ABNORMAL LOW (ref 4.22–5.81)
WBC: 8 10*3/uL (ref 4.0–10.5)

## 2010-08-05 LAB — DIFFERENTIAL
Basophils Absolute: 0 10*3/uL (ref 0.0–0.1)
Basophils Relative: 0 % (ref 0–1)
Eosinophils Relative: 3 % (ref 0–5)
Monocytes Absolute: 0.5 10*3/uL (ref 0.1–1.0)
Neutro Abs: 5.8 10*3/uL (ref 1.7–7.7)

## 2010-08-09 LAB — CBC
Platelets: 96 10*3/uL — ABNORMAL LOW (ref 150–400)
RDW: 12.2 % (ref 11.5–15.5)

## 2010-08-09 LAB — BASIC METABOLIC PANEL
BUN: 15 mg/dL (ref 6–23)
Calcium: 8.6 mg/dL (ref 8.4–10.5)
Creatinine, Ser: 1.17 mg/dL (ref 0.4–1.5)
GFR calc non Af Amer: >60 mL/min (ref >60)
Glucose, Bld: 358 mg/dL — ABNORMAL HIGH (ref 70–99)
Sodium: 131 mEq/L — ABNORMAL LOW (ref 135–145)

## 2010-08-09 LAB — GLUCOSE, CAPILLARY
Glucose-Capillary: 218 mg/dL — ABNORMAL HIGH (ref 70–99)
Glucose-Capillary: 271 mg/dL — ABNORMAL HIGH (ref 70–99)

## 2010-08-09 LAB — HEMOGLOBIN A1C: Mean Plasma Glucose: 232 mg/dL

## 2010-08-21 LAB — GLUCOSE, CAPILLARY: Glucose-Capillary: 229 mg/dL — ABNORMAL HIGH (ref 70–99)

## 2010-08-21 LAB — BASIC METABOLIC PANEL
CO2: 26 mEq/L (ref 19–32)
Calcium: 9.1 mg/dL (ref 8.4–10.5)
Creatinine, Ser: 1.08 mg/dL (ref 0.4–1.5)
GFR calc Af Amer: >60 mL/min (ref >60)

## 2010-08-21 LAB — CULTURE, ROUTINE-ABSCESS

## 2010-08-21 LAB — CBC
MCHC: 35.2 g/dL (ref 30.0–36.0)
Platelets: 145 10*3/uL — ABNORMAL LOW (ref 150–400)
RBC: 3.88 MIL/uL — ABNORMAL LOW (ref 4.22–5.81)
WBC: 8.6 10*3/uL (ref 4.0–10.5)

## 2010-08-21 LAB — ANAEROBIC CULTURE

## 2010-08-22 LAB — COMPREHENSIVE METABOLIC PANEL
ALT: 21 U/L (ref 0–53)
AST: 21 U/L (ref 0–37)
Albumin: 3.9 g/dL (ref 3.5–5.2)
Alkaline Phosphatase: 42 U/L (ref 39–117)
BUN: 11 mg/dL (ref 6–23)
Chloride: 103 mEq/L (ref 96–112)
GFR calc Af Amer: >60 mL/min (ref >60)
Potassium: 4.1 mEq/L (ref 3.5–5.1)
Sodium: 138 mEq/L (ref 135–145)
Total Bilirubin: 1 mg/dL (ref 0.3–1.2)
Total Protein: 7.3 g/dL (ref 6.0–8.3)

## 2010-08-22 LAB — DIFFERENTIAL
Basophils Absolute: 0 10*3/uL (ref 0.0–0.1)
Basophils Relative: 0 % (ref 0–1)
Eosinophils Absolute: 0.2 10*3/uL (ref 0.0–0.7)
Eosinophils Relative: 3 % (ref 0–5)
Monocytes Absolute: 0.5 10*3/uL (ref 0.1–1.0)
Monocytes Relative: 8 % (ref 3–12)

## 2010-08-22 LAB — CBC
HCT: 38.1 % — ABNORMAL LOW (ref 39.0–52.0)
Platelets: 114 10*3/uL — ABNORMAL LOW (ref 150–400)
WBC: 6.2 10*3/uL (ref 4.0–10.5)

## 2010-08-22 LAB — BETA-2-GLYCOPROTEIN I ABS, IGG/M/A
Beta-2 Glyco I IgG: 4 U/mL (ref <=15)
Beta-2-Glycoprotein I IgA: 4 U/mL (ref <=15)
Beta-2-Glycoprotein I IgM: 8 U/mL (ref <=15)

## 2010-08-22 LAB — PROTEIN C, TOTAL: Protein C, Total: 105 % (ref 70–140)

## 2010-08-22 LAB — LUPUS ANTICOAGULANT PANEL
DRVVT: 51.5 secs — ABNORMAL HIGH (ref 34.7–40.5)
Drvvt confirmation: 1.36 Ratio — ABNORMAL HIGH (ref <1.18)
Lupus Anticoagulant: DETECTED — AB

## 2010-08-26 LAB — CBC
HCT: 31.6 % — ABNORMAL LOW (ref 39.0–52.0)
Hemoglobin: 11.4 g/dL — ABNORMAL LOW (ref 13.0–17.0)
MCHC: 36.1 g/dL — ABNORMAL HIGH (ref 30.0–36.0)
MCV: 98.6 fL (ref 78.0–100.0)
RDW: 12.5 % (ref 11.5–15.5)

## 2010-08-26 LAB — DIFFERENTIAL
Basophils Absolute: 0 10*3/uL (ref 0.0–0.1)
Basophils Relative: 0 % (ref 0–1)
Eosinophils Relative: 2 % (ref 0–5)
Lymphocytes Relative: 16 % (ref 12–46)
Monocytes Absolute: 0.7 10*3/uL (ref 0.1–1.0)

## 2010-08-26 LAB — APTT: aPTT: 46 seconds — ABNORMAL HIGH (ref 24–37)

## 2010-08-28 LAB — TISSUE TRANSGLUTAMINASE, IGA: Tissue Transglutaminase Ab, IgA: 0.4 U/mL (ref <7)

## 2010-08-28 LAB — DIFFERENTIAL
Basophils Absolute: 0 10*3/uL (ref 0.0–0.1)
Basophils Relative: 0 % (ref 0–1)
Eosinophils Absolute: 0.2 10*3/uL (ref 0.0–0.7)
Eosinophils Relative: 2 % (ref 0–5)
Lymphocytes Relative: 19 % (ref 12–46)
Lymphs Abs: 1.4 10*3/uL (ref 0.7–4.0)
Monocytes Absolute: 0.6 10*3/uL (ref 0.1–1.0)
Monocytes Relative: 8 % (ref 3–12)
Neutro Abs: 5.4 10*3/uL (ref 1.7–7.7)
Neutrophils Relative %: 71 % (ref 43–77)

## 2010-08-28 LAB — COMPREHENSIVE METABOLIC PANEL
Alkaline Phosphatase: 51 U/L (ref 39–117)
BUN: 14 mg/dL (ref 6–23)
GFR calc non Af Amer: >60 mL/min (ref >60)
Glucose, Bld: 152 mg/dL — ABNORMAL HIGH (ref 70–99)
Potassium: 4 mEq/L (ref 3.5–5.1)
Total Bilirubin: 0.6 mg/dL (ref 0.3–1.2)
Total Protein: 6.7 g/dL (ref 6.0–8.3)

## 2010-08-28 LAB — CBC
HCT: 39.2 % (ref 39.0–52.0)
Hemoglobin: 13.7 g/dL (ref 13.0–17.0)
MCHC: 34.9 g/dL (ref 30.0–36.0)
MCV: 98.5 fL (ref 78.0–100.0)
Platelets: 139 10*3/uL — ABNORMAL LOW (ref 150–400)
RBC: 3.98 MIL/uL — ABNORMAL LOW (ref 4.22–5.81)
RDW: 12 % (ref 11.5–15.5)
WBC: 7.6 10*3/uL (ref 4.0–10.5)

## 2010-08-28 LAB — HEMOGLOBIN A1C
Hgb A1c MFr Bld: 7 % — ABNORMAL HIGH (ref 4.6–6.1)
Mean Plasma Glucose: 154 mg/dL

## 2010-08-28 LAB — GLIADIN ANTIBODIES, SERUM
Gliadin IgA: 3.8 U/mL (ref <7)
Gliadin IgG: 1.2 U/mL (ref <7)

## 2010-08-28 LAB — RETICULIN ANTIBODIES, IGA W TITER: Reticulin Ab, IgA: NEGATIVE

## 2010-10-01 NOTE — Assessment & Plan Note (Signed)
[facility]                       [place] CARDIOLOGY OFFICE NOTE   Cassell, [place] DJON TITH                     MRN:          [id number]  DATE:12/09/2006                            DOB:          [birth year]    [name] is seen today for the first time by me. He saw Dr. Jens Som  four weeks ago. He has a known history of coronary artery disease with a  previous inferior wall MI.   He has had stents placed to the proximal LAD, mid-right, as well as  posterior lateral branches.   He had an episode of atypical chest pain prior to seeing Dr. Jens Som a  few weeks ago. Dr. Jens Som subsequently ordered an echocardiogram and a  Myoview study. 2D echocardiogram showed mild to moderate biatrial  enlargement with no significant valvular abnormalities with an EF of 60%  to 65%. His Myoview I personally reviewed. It took me approximately 10  minutes to walk over to radiology and review the images which had to be  loaded. He has a mild left ventricular cavity enlargement. There is an  inferior wall infarct. There is probably at least moderate periinfarct  ischemia involving the inferior wall. The EF was estimated at 50%.   I talked with [name] at length and he clearly has not had any significant  angina. His initial episode a few weeks ago was atypical as described by  Dr. Jens Som. The patient walks 3 to 5 miles at night after work.   He is clearly not having any symptoms at this time.   I talked with [name] at length and I explained to him that his stress  test was abnormal, but I thought that given his clinical situation is so  stable that we could continue to treat him medically. When he last saw  Dr. Jens Som, he was started on a statin drug.   He seems to be tolerating this fine. His review of systems is otherwise  negative.   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 He rides his [place] all over the  country including  to [employer] and to [place].   CURRENT MEDICATIONS:  1. Spironolactone 50 daily.  2. Glyburide 10 b.i.d.  3. Metoprolol 125 b.i.d.  4. Metformin 1 gram b.i.d.  5. Actos 45 daily.  6. Simvastatin 40 daily.  7. Zegerid.   PHYSICAL EXAMINATION:  GENERAL:  Remarkable for an overweight white male  in no distress. His affect is appropriate.  VITAL SIGNS:  Weight 287, respiratory rate 14, blood pressure 110/60,  pulse 78 and regular.  HEENT:  Normal.  NECK:  There is no JVP elevation, lymphadenopathy, or thyromegaly.  LUNGS:  Clear with diaphragmatic motion. No wheezing.  CARDIAC:  There is an S1, S2 with a systolic ejection murmur. PMI is  normal.  ABDOMEN:  Benign. Bowel sounds are positive. No tenderness. No  hepatosplenomegaly or hepato jugular reflux.  EXTREMITIES:  Femorals are +3 bilaterally. PT are +3. There is no lower  extremity edema and no varicosities.  NEUROLOGIC:  Non-focal. There is no muscular weakness.  IMPRESSION:  1. Coronary artery disease, currently no angina with a good activity      level; abnormal Myoview with inferior wall infarct and periinfarct      ischemia; continue medical therapy as warranted. He is on a good      regimen. His blood pressure and heart rate are under good control.      I did refill his prescription for nitroglycerin since the one that      he had was over four years old. He will call us if he gets any      recurrent pain. I will have a low threshold to repeat      catheterization on the patient since he has three stents in place      and an abnormal Myoview. Otherwise, I will see him back in six      months.  2. Hypercholesterolemia. Simvastatin seems to be going well. He is on      40 per day without myalgias. He will have a follow up lipid and      liver profile in six months.  3. Hypertension, currently under good control. Continue current      medications and low salt diet.  4. Reflux. Continue Zegerid 40  mg daily and avoid spicy food.  5. Diabetes. Follow up primary care doctor. Hemoglobin a1C quarterly.      Continue Glyburide and Metformin. It would be nice if the patient      can have his Actos stopped in regards to his cardiac status.   Overall, the patient is doing well without active angina and I will see  him in six months.     Noralyn Pick. Eden Emms, MD, [facility]  Electronically Signed    PCN/MedQ  DD: 12/09/2006  DT: 12/10/2006  Job #: [id number]

## 2010-10-01 NOTE — Procedures (Signed)
NAME:  [name], Kelly              ACCOUNT NO.:  [id number]   MEDICAL RECORD NO.:  [id number]          PATIENT TYPE:  OUT   LOCATION:  RAD                           FACILITY:  [facility]   PHYSICIAN:  Pricilla Riffle, MD, FACCDATE OF BIRTH:  April 01, 1956   DATE OF PROCEDURE:  11/12/2006  DATE OF DISCHARGE:                                ECHOCARDIOGRAM   IDENTIFICATION:  The patient is a 55 year old with history of murmur and  a history of MI   A 2-D echo with echo Doppler.  Left ventricle is normal in size.  End-  diastolic dimension is 48 mm.  Right interventricular septum is mildly  thickened at 13 mm, posterior wall normal at 11 mm, right ventricle is  mildly dilated, left atrium is mild to moderately dilated 47 mm, right  atrium is also mildly dilated.  Aortic root normal at 34 mm.   Overall aortic valve is normal with no insufficiency.  Mitral valve is  normal with no insufficiency.  Pulmonic valve is normal with no  insufficiency.  Tricuspid valve is normal with no insufficiency.   LV systolic function is normal with an LVEF of approximately 60-65%.  No  regional wall motion abnormalities are noted.  RV systolic function is  normal.   No pericardial effusion is seen.   IMPRESSION:  1. Normal LV/RV systolic function.  2. Mild RV dilatation.  3. Mild to moderate RA and LA dilatation.  4. No significant valvular abnormalities.      Pricilla Riffle, MD, [facility]  Electronically Signed     PVR/MEDQ  D:  11/12/2006  T:  11/13/2006  Job:  [id number]

## 2010-10-01 NOTE — Assessment & Plan Note (Signed)
[facility]                       [place] CARDIOLOGY OFFICE NOTE   Yecheskel, Kurek DAMIRE REMEDIOS                     MRN:          [id number]  DATE:11/04/2006                            DOB:          04-07-1956    [name] is a pleasant 55 year old male who has a history of coronary  artery disease and diabetes, whom I am asked to evaluate for chest pain,  his coronary artery disease, and evaluation prior to colonoscopy and  endoscopy.  The patient has had a previous myocardial infarction.  This  occurred in August of 2000.  At that time, he had stenting of his  proximal LAD and PTCA of his posterior lateral branch.  His most recent  catheterization was in February of 2001.  At that time, he was found to  have no significant left main disease.  There was mild disease noted in  the LAD as well as the circumflex.  The RCA had a 70% narrowing and an  80% narrowing in the PDA at the osteum.  There was also a 70% in the  first posterior lateral.  His ejection fraction was 60%.  The patient  had stenting of the mid right coronary artery as well as stenting of the  first posterior lateral.  He also had stenting of the second posterior  lateral.  He has not had cardiology followup since then.  Note, he was  diagnosed with lymphoma approximately 1-and-a-half years ago and had a  lymph node removed from his right inguinal area.  He states that he has  changed his lifestyle since.  Note, he has not smoked since his heart  attack.  He exercises routinely.  He typically does not have dyspnea on  exertion, orthopnea, PND, pedal edema, palpitations, pre-syncope,  syncope, or exertional chest pain.  Approximately 1-and-a-half weeks ago  he got on his treadmill for his usual 30 minute walk.  The incline  increased to the maximum, which was unusual for him, and he started  walking and developed a sharp pain in his mid chest area for 1 second  and then it resolved, as he  immediately quit what he was doing.  He has  had no further symptoms since then.  Of note, he states that it is  unlike the pain with his infarct.  He has been having problems with  abdominal pain after eating and is scheduled for endoscopy and  colonoscopy tomorrow.  We were asked to evaluate prior to that  procedure, and also for his chest pain.   MEDICATIONS AT PRESENT:  1. Diclofenac 50 mg p.o. b.i.d.  2. Iron sulfate 325 mg p.o. daily.  3. Spironolactone 50 mg p.o. daily.  4. Glyburide 10 mg p.o. b.i.d.  5. Metoprolol 25 mg p.o. b.i.d.  6. Metformin 1000 mg p.o. b.i.d.  7. Actos 45 mg p.o. daily.  8. Ramipril 5 mg p.o. daily.  9. Aspirin 81 mg p.o. daily.  10.Hydrocodone APAP 7.5/750 p.r.n.  11.Alprazolam 1 mg p.r.n.  12.Digestive Advantage.   He has no known drug allergies.   SOCIAL HISTORY:  He has a remote history of  tobacco use, but has not  smoked since his infarct.  He does not consume alcohol.   FAMILY HISTORY:  Positive for coronary artery disease.   PAST MEDICAL HISTORY:  Significant for diabetes mellitus, but he denies  any hypertension or hyperlipidemia.  He has a history of coronary artery  disease as outlined in the HPI.  He has had a diagnosis of lymphoma.  He  has had 2 previous back surgeries.  He has also had neck surgery.  Note,  he has not received chemotherapy or radiation for his lymphoma.   REVIEW OF SYSTEMS:  He denies any headaches, fevers, or chills.  There  is no productive cough or hemoptysis.  There is no dysphagia,  odynophagia, melena, or hematochezia.  There is no constipation.  He  does have abdominal pain after eating.  There is no dysuria or  hematuria.  There is no rash or seizure activity.  There is no  orthopnea, PND, or pedal edema.  He has had some pain in his left knee  with ambulation.  The remaining systems are negative.   PHYSICAL EXAM:  He is well-developed and well-nourished, and somewhat  obese.  He is in no acute distress  at present.  SKIN:  Warm and dry.  He does not appear to be depressed and has no  peripheral clubbing.  BACK:  Significant for previous back surgery in the lower back area.  He weighs 288 pounds.  His blood pressure is 128/70 and his pulse is 63.  HEENT:  Normal with normal eyelids.  NECK:  Supple with a normal upstroke bilaterally and I cannot appreciate  bruits.  There is no jugular venous distension and no thyromegaly noted.  CHEST:  Clear to auscultation with normal expansion.  CARDIOVASCULAR:  Regular rate and rhythm with a normal S1, S2.  There is  a 2/6 systolic murmur at the left sternal border.  S2 is preserved.  I  cannot palpate his PMI.  ABDOMEN:  Shows mild diffuse tenderness to palpation, but there is no  rebound or guarding.  I cannot palpate masses.  There is no  hepatosplenomegaly.  There is no abdominal bruit.  He has 2+ femoral pulses bilaterally and there are no bruits noted.  He  is status post removal of a lymph node from his inner right upper thigh.  I cannot palpate cords.  There is no edema.  He has 2+ dorsalis pedis  pulses bilaterally.  NEUROLOGIC:  Grossly intact.   ELECTROCARDIOGRAM:  Sinus rhythm at a rate of 63.  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  There are no ST changes noted.   DIAGNOSES:  1. Recent episode of chest pain - His symptoms were very brief and his      electrocardiogram is normal.  He does exercise routinely without      chest pain.  However, it has been 8 years since his previous      myocardial infarction.  We will plan to proceed with stress Myoview      for risk stratification.  If this shows normal perfusion, then we      will continue with medical therapy.  We will also check an      echocardiogram to quantify his left ventricular function and to      evaluate his murmur, which does not sound significant on exam. 2. Preoperative evaluation prior to colonoscopy and       esophagogastroduodenoscopy -  this is a low-risk procedure and, as      stated above, he did not have significant chest pain with exertion      while walking on his treadmill for 30 minutes, nor did he have      dyspnea on exertion.  I think he can proceed with this without      delay and the stress test and echocardiogram can be performed      afterwards.  3. Coronary artery disease - he will continue on his aspirin, ACE      inhibitor, and beta blocker.  Given his diabetes and coronary      artery disease, I also think he needs a Statin.  We will add Zocor      40 mg p.o. nightly and we will check lipids and liver in 6 weeks      and adjust as indicated with a goal LDL of less than 70.  4. Hypertension - his blood pressure is well controlled on his present      medications.  5. Lymphoma - per his oncologist.  6. Abdominal pain - per the gastroenterologist.   He will continue with risk factor modification.  I will have him see 1  of our [place] doctors back in approximately 4 weeks after his above  studies.     Madolyn Frieze Jens Som, MD, [facility]  Electronically Signed    BSC/MedQ  DD: 11/04/2006  DT: 11/04/2006  Job #: [id number]   cc:   Ramon Dredge L. Juanetta Gosling, M.D.

## 2010-10-04 NOTE — Op Note (Signed)
NAME:  Kelly [name], Kelly [name]              ACCOUNT NO.:  [id number]   MEDICAL RECORD NO.:  [id number]          PATIENT TYPE:  AMB   LOCATION:  DAY                           FACILITY:  [facility]   PHYSICIAN:  Dalia Heading, M.D.  DATE OF BIRTH:  Oct 21, 1955   DATE OF PROCEDURE:  09/15/2005  DATE OF DISCHARGE:                                 OPERATIVE REPORT   PREOPERATIVE DIAGNOSIS:  Right inguinal lymphadenopathy.   POSTOPERATIVE DIAGNOSIS:  Right inguinal lymphadenopathy.   PROCEDURE:  Right inguinal lymph node dissection.   SURGEON:  Dr. Franky Macho   ANESTHESIA:  General.   INDICATIONS:  The patient is a 55 year old white male who is referred for  evaluation and treatment of several lymph nodes in the right groin region  which had been enlarging in size.  Risks and benefits of procedure including  bleeding, infection, and swelling in the right inguinal region were fully  explained to the patient, who gave informed consent.   PROCEDURE NOTE:  The patient was placed in the supine position.  After  general anesthesia was administered, the right groin region was prepped and  draped in the usual sterile technique with Betadine.  Surgical site  confirmation was performed.   A transverse incision was made in the right groin just below the inguinal  crease.  This was taken down to the subcutaneous tissue.  Two large lymph  nodes were found and excised without difficulty.  They were sent to  pathology for further examination.  Any bleeding was controlled using Bovie  electrocautery.  The subcutaneous layer was reapproximated using 3-0 Vicryl  interrupted sutures.  The skin was closed using a 4-0 Vicryl subcuticular  suture.  0.5 cm Sensorcaine was instilled in the surrounding wound.  Dermabond was then applied.   All needle counts were correct at the end of procedure.  The patient was  awakened and transferred to PACU in stable condition.  Complications none.   SPECIMEN:  Right inguinal  lymph nodes.   BLOOD LOSS:  Minimal.      Dalia Heading, M.D.  Electronically Signed     MAJ/MEDQ  D:  09/15/2005  T:  09/15/2005  Job:  [id number]   cc:   Ramon Dredge L. Juanetta Gosling, M.D.  Fax: [phone]

## 2010-10-04 NOTE — H&P (Signed)
NAME:  Kelly [name], Kelly [name]              ACCOUNT NO.:  [id number]   MEDICAL RECORD NO.:  [id number]          PATIENT TYPE:  AMB   LOCATION:  DAY                           FACILITY:  [facility]   PHYSICIAN:  Dalia Heading, M.D.  DATE OF BIRTH:  06-29-1955   DATE OF ADMISSION:  DATE OF DISCHARGE:  LH                                HISTORY & PHYSICAL   CHIEF COMPLAINT:  Right inguinal lymphadenopathy.   HISTORY OF PRESENT ILLNESS:  The patient is a 55 year old white male who is  referred for evaluation and treatment of a tender mass in the right groin.  It has been present for one month and seems to be enlarging.  No fever,  chills, generalized malaise, weight loss have been noted.  Antibiotics have  not been helpful.   PAST MEDICAL HISTORY:  1.  Coronary artery disease.  2.  Hypertension.  3.  Noninsulin dependent diabetes mellitus.   PAST SURGICAL HISTORY:  1.  Two back surgeries.  2.  Neck surgery.  3.  Multiple coronary stents placed many years ago.   CURRENT MEDICATIONS:  1.  Sugar pills.  2.  Plavix which he is holding.  3.  Unknown heart pills.   ALLERGIES:  No known drug allergies.   REVIEW OF SYSTEMS:  The patient smokes less than a half pack of cigarettes a  day.  He denies any alcohol use, denies any recent angina, MI, CVA, or  bleeding disorders.   PHYSICAL EXAMINATION:  GENERAL:  The patient is a well-developed, well-  nourished white male in no acute distress.  NECK:  Supple without lymphadenopathy.  LUNGS:  Clear to auscultation with equal breath sounds bilaterally.  HEART:  Reveals regular rate and rhythm without S3, S4, or murmurs.  ABDOMEN:  Soft and nondistended.  It is tender in the right inferior groin  region to palpation over a large subcutaneous 4 cm oblong mass.  No  hepatosplenomegaly or hernias are noted.   IMPRESSION:  Right inguinal lymphadenopathy.   PLAN:  The patient is scheduled for excision of lymph node, right inguinal  region, on July 21, 2005.  The risks and benefits of the procedure including  bleeding, infection, and the possibility of malignancy were fully explained  to the patient, who gave informed consent.      Dalia Heading, M.D.  Electronically Signed     MAJ/MEDQ  D:  07/17/2005  T:  07/17/2005  Job:  [id number]   cc:   Dalia Heading, M.D.  Fax: [phone]   [name]   Ramon Dredge L. Juanetta Gosling, M.D.  Fax: [phone]

## 2010-10-04 NOTE — Discharge Summary (Signed)
[place]. [facility]  Patient:    Kelly [name], Kelly [name]                     MRN: [id number] Adm. Date:  [id number] Disc. Date: [id number] Attending:  Talitha Givens Dictator:   Lavella Hammock, P.A. CC:         Madolyn Frieze. Jens Som, M.D. [facility]  Kari Baars, M.D., [place], [place]   Referring Physician Discharge [place]  DATE OF BIRTH:  October 29, 1954.  PROCEDURE:  Adenosine Cardiolite.  HOSPITAL COURSE:  Kelly [name] is a 55 year old male with a history of coronary artery disease, who was transferred to [facility] from [facility] Emergency Room for evaluation of chest pain.  He had an MI on January 07, 2000 and had PTCA and stent to the LAD, posterolateral branch, and also a history of PTCA to the RCA in February of 2001 and a stent to the PL-1 and PTCA to the PL-2 and dissection of the RCA with two stents, all in February of 2001.  He had chest pressure associated with diaphoresis on the day of admission and was transferred to rule out MI for further evaluation.  His enzymes were negative for MI and an adenosine Cardiolite was considered an appropriate test for risk stratification.  He had the Cardiolite on March 09, 2000 and it showed an inferior infarct with mild-to-moderate peri-infarct ischemia.  It was felt that medical therapy would be the best, since the symptoms were atypical. Dr. Madolyn Frieze. Crenshaw and Dr. Noralyn Pick. Nishan evaluated the patient and the situation was discussed with the patient and his wife.  It was felt that his symptoms were unlike those prior to the percutaneous intervention and his EKG was normal and his enzymes were negative, then medical therapy was the best option.  He had no further episodes of chest pain and was ambulating without difficulty.  He was considered stable for discharge on March 10, 2000.  Adenosine Cardiolite:  Hypokinesia involving the distal inferoseptal region near the apex with an EF of 47%.  No definite  evidence of ischemia or infarction on the SPECT images.  LABORATORY AND X-RAY FINDINGS:  Hemoglobin 13.4, hematocrit 37.1, WBC 7.1, platelets 161,000.  Serial CK-MB and troponin I negative for MI.  Total cholesterol 177, triglycerides 492,000, HDL 33, LDL not calculated.  DISCHARGE CONDITION:  Stable.  CONSULTANTS:  None.  COMPLICATIONS:  None.  DISCHARGE DIAGNOSES  1. Atypical chest pain; medical therapy recommended.  2. 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  3. History of percutaneous intervention in February of 2001 with percutaneous     transluminal coronary angioplasty to the right coronary artery, dissection     of the right coronary artery requiring two stents and a stent to the     posterolateral #1 and a percutaneous transluminal coronary angioplasty to     posterolateral #2.  4. Non-insulin-dependent diabetes mellitus.  5. Hypertension.  6. Dyslipidemia.  7. Obesity.  8. Remote tobacco use, quit one year ago.  9. History of lumbar disk surgery and cervical disk surgery. 10. Recent injury to the left small toe secondary to a thermal injury, being     treated with antibiotics.  DISCHARGE INSTRUCTIONS  1. His activity level is to be as tolerated.  2. He is to stick to a  low-fat, diabetic diet.  3. He is to follow up with the physician assistant for Dr. Jens Som,     November 8th at 10 a.m.  4. He is to follow up with Dr. Kari Baars as needed.  DISCHARGE MEDICATIONS  1. Coated aspirin 325 mg q.d.  2. Lopressor 50 mg one-half tab b.i.d.  3. Altace 5 mg q.d.  4. Quinine p.r.n.  5. Glucophage 500 mg two tabs q.d.  6. Avandia 8 mg q.d.  7. Glyburide 5 mg two tabs b.i.d.  8. Augmentin b.i.d. for three more days.  9. Nitroglycerin 0.4 mg p.r.n. 10. Statin per M.D. DD:  03/10/00 TD:  03/10/00 Job:  [id number]

## 2010-10-04 NOTE — Cardiovascular Report (Signed)
[place]. [facility]  Patient:    Kelly [name], Kelly [name]                     MRN: [id number] Proc. Date: 07/12/99 Adm. Date:  [id number] Attending:  Lenoria Farrier CC:         Kari Baars, M.D.             Thomas C. Wall, M.D. [facility]             Madolyn Frieze. Jens Som, M.D. [facility]             Bruce R. Juanda Chance, M.D. [facility]             Cardiopulmonary Laboratory                        Cardiac Catheterization  INDICATIONS:  Mr. Minar is 55 years old and has known coronary artery disease.  Last August he had stenting of the proximal LAD and PTCA of the posterolateral branch of the right coronary artery after a non-Q-wave infarction.  He had a guiding catheter dissection in the proximal right requiring two stents to seal he dissection.  He recently had recurrent chest pain and underwent a Cardiolite scan which showed inferior ischemia.  DESCRIPTION OF PROCEDURE:  The procedure was performed via the right femoral artery using an arterial sheath and 6 French preformed coronary catheters.  After it was recognized there was restenosis in the posterolateral branch of the right coronary artery and progression in the mid right coronary artery, preparation was made for intervention.  The patient was given weight-adjusted heparin to prolong the ACT  greater than 200 seconds and was given double bolus Integrilin and infusion. We used a 6 [place] guiding catheter with side holes and a short floppy wire. e initially direct stented the mid right coronary artery lesion with a 3.5 x 13 mm Tetra.  We deployed this with two inflations of 12 and 15 atmospheres for 39 and 33 seconds.  We then passed a wire into the second posterolateral branch of the right coronary artery and dilated this with a 2.5 x 20 mm CrossSail performing four inflations up to 8 atmospheres for 38 seconds.  This compromised the first posterolateral branch which was initially 70% and got somewhat worse,  and we had dilated this with a 2.5 CrossSail for 8 atmospheres for 30 seconds.  We then deployed a 2.5 x 15 mm [id number] stent with one inflation of 8 atmospheres for 43 seconds.  This compromised the second posterolateral branch which was a side branch off of the stented area.  We went in with a 2.25 x 15 mm CrossSail and performed two inflations of 10 atmospheres for 39 and 24 seconds.  We then went back in the main channel through the stent and dilated with a 2.5 x 20 mm CrossSail with two inflations for 6 and 9 atmospheres for 19 and 38 seconds.  This resulted in some further compromise of the second posterolateral branch but we elected not to go  back and dilate that again, and felt that using two balloons in these small vessels would not be advisable.  The patient had a great deal of back pain during the procedure and received approximately 6 mg of Versed and 10 mg of morphine.  We closed the right femoral artery with Perclose at the end of the procedure.  Otherwise he tolerated the procedure well and left  the laboratory in satisfactory condition.  RESULTS:  The left main coronary artery:  The left main coronary artery was free of significant disease.  Left anterior descending:  The left anterior descending artery gave rise to three septal perforators and three diagonal branches.  There was 40% proximal narrowing and there was 30% narrowing with the stent in the proximal portion of the vessel which overlaps the second diagonal branch.  The distal vessel is free of significant disease.  Circumflex artery:  The circumflex artery gave rise to an intermediate branch and a posterolateral branch.  These vessels were free of significant disease.  Right coronary artery:  The right coronary was a moderately large vessel that gave rise to a posterior descending and two posterolateral branches.  There was 30% narrowing near the ostium with the two stents in the proximal  vessel.  There was 70% narrowing in the mid vessel which had progressed.  There was 80% narrowing n the ostium of the posterior descending branch which had not changed and there was 90% restenosis in the second posterolateral branch.  There was also 70% segmental narrowing in the first posterolateral branch which overlapped the second diagonal branch (branch stenosis).  LEFT VENTRICULOGRAPHY:  The left ventriculogram performed in the RAO projection  showed good wall motion with no areas of hypokinesis.  The estimated ejection fraction was 60%.  Following stenting of the mid right coronary artery, the stenosis improved from 70% to less than 10%.  Following stenting of the first posterolateral branch which crossed the second posterolateral branch, the stenosis improved from 70% to 10%.  Following PTCA of the second posterolateral branch the stenosis improved from 90% to 50%.  The aortic pressure was 120/70 with a mean of 89.  Left ventricular pressure was 120/19.  CONCLUSIONS: 1. Coronary artery disease, status post prior stenting of the proximal    left anterior descending and prior percutaneous transluminal coronary    angioplasty of the right coronary artery in August of 2000 with 30%    narrowing in the stent site in the left anterior descending and 40%    proximal narrowing in the native left anterior descending, 30% narrowing    within the proximal stents in the right coronary artery, 70% stenosis in the  mid right coronary artery (new), 80% narrowing in the ostium of the    posterior descending branch (old), and 90% restenosis in the second    posterolateral branch of the right coronary artery with 70% stenosis in the    first posterolateral branch and normal left ventricular function. 2. Successful stenting of the mid right coronary artery with improvement in    percent diameter narrowing from 70% to less than 10%. 3. Successful stenting of the first posterolateral  branch of the right     coronary artery with improvement in the percent diameter narrowing from    70% to 10% and suboptimal result in the second posterolateral branch    which is a side branch off the first posterolateral branch with improvement    in percent diameter narrowing from 90% to 50%.  DISPOSITION:  The patient was returned to the postangioplasty for further observation. DD:  07/12/99 TD:  07/13/99 Job: [id number]

## 2010-10-14 IMAGING — US US EXTREM LOW VENOUS*L*
1 series · 14 of 24 positions shown · non-contrast
Comparison: None

CLINICAL DATA: Pain swelling left leg.  History of lymphoma.



[Series 1: unknown · 14 of 37 slices shown]
[im 1/37]
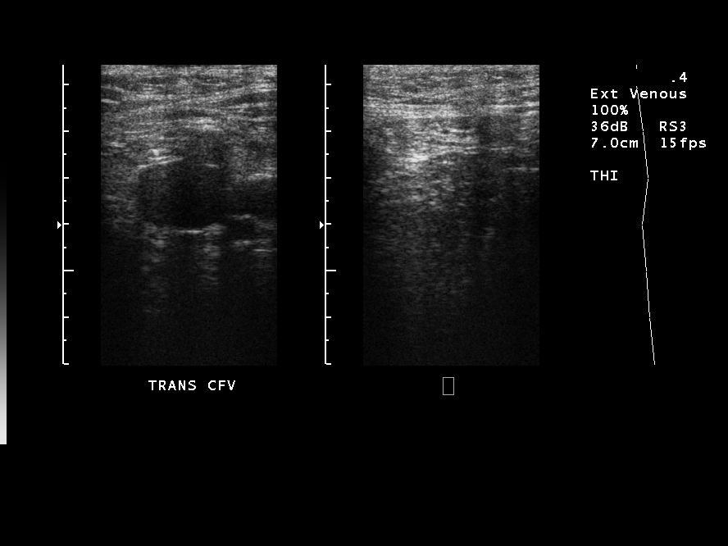
[im 4/37]
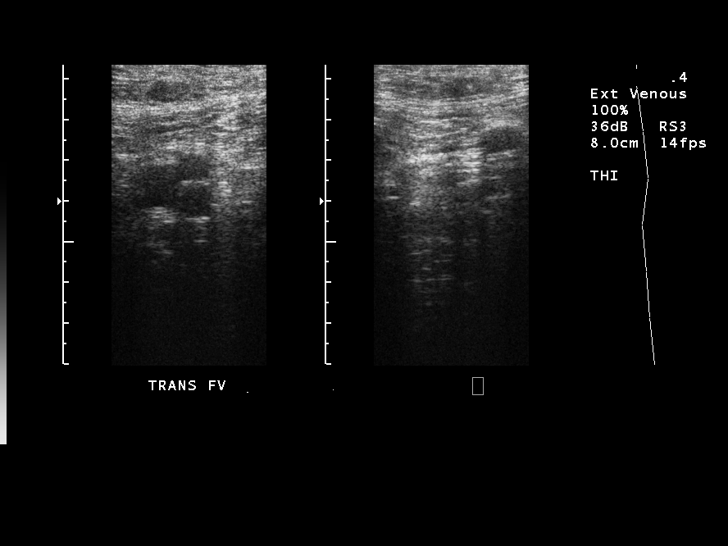
[im 7/37]
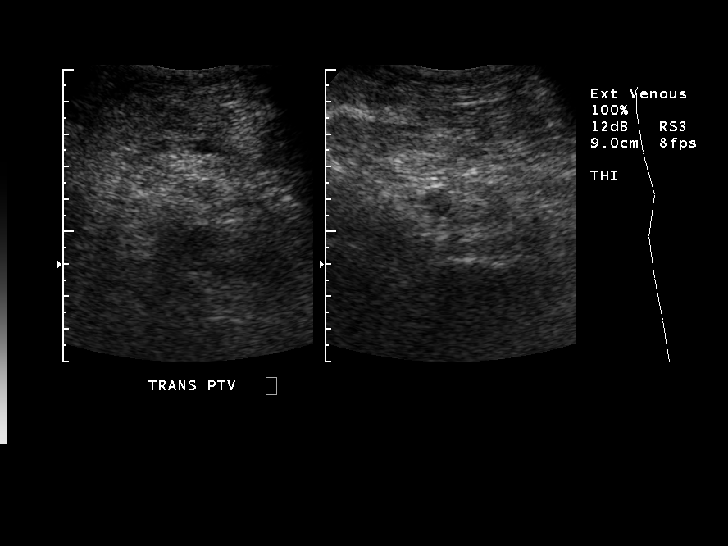
[im 10/37]
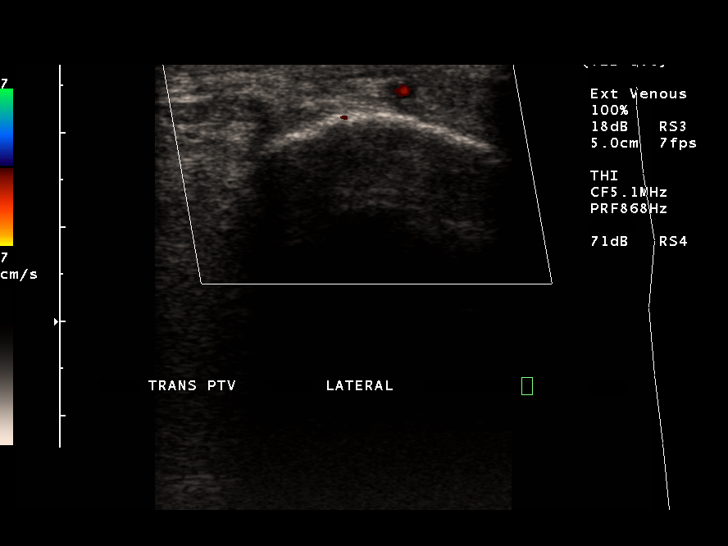
[im 11/37]
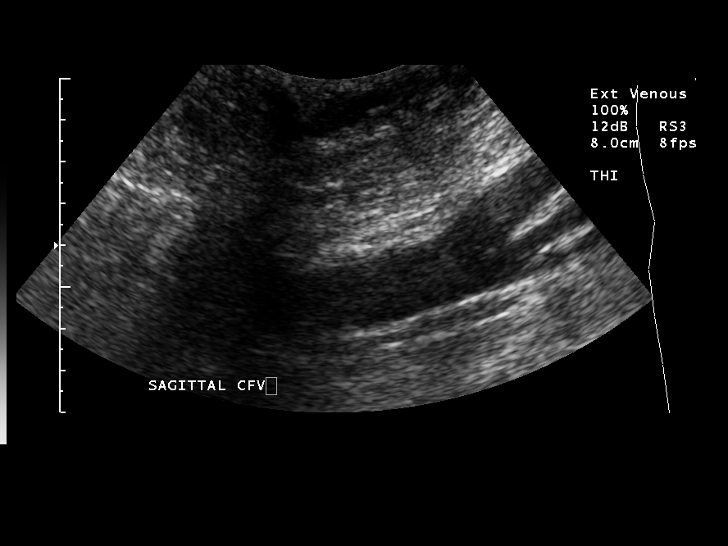
[im 15/37]
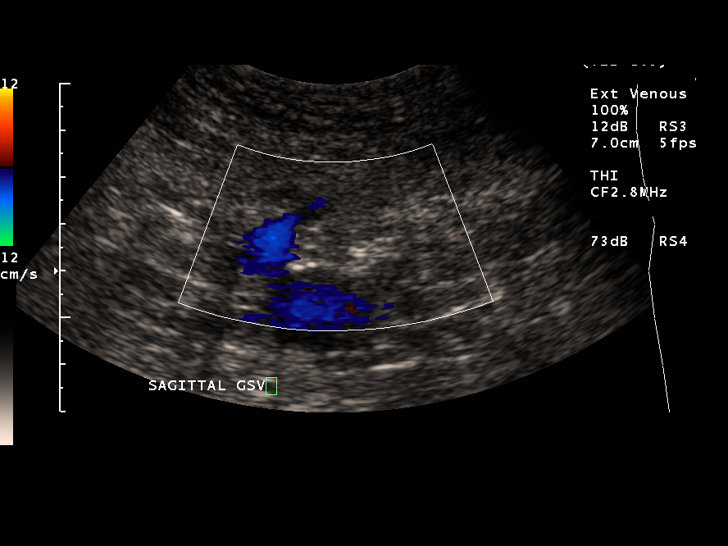
[im 18/37]
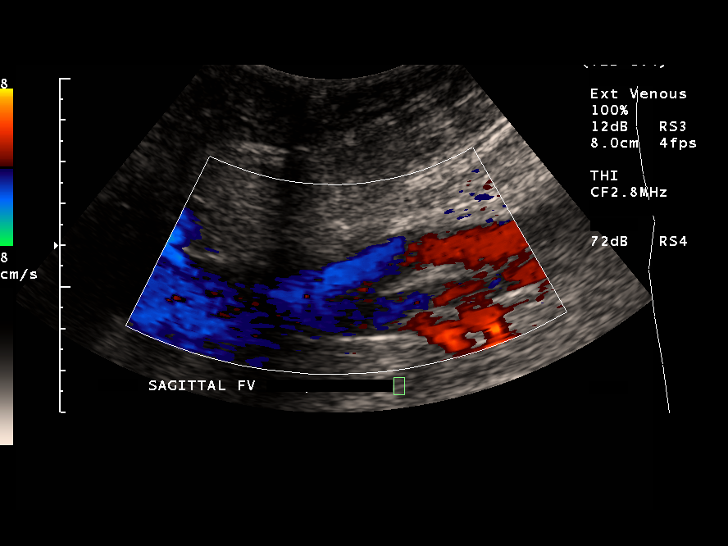
[im 19/37]
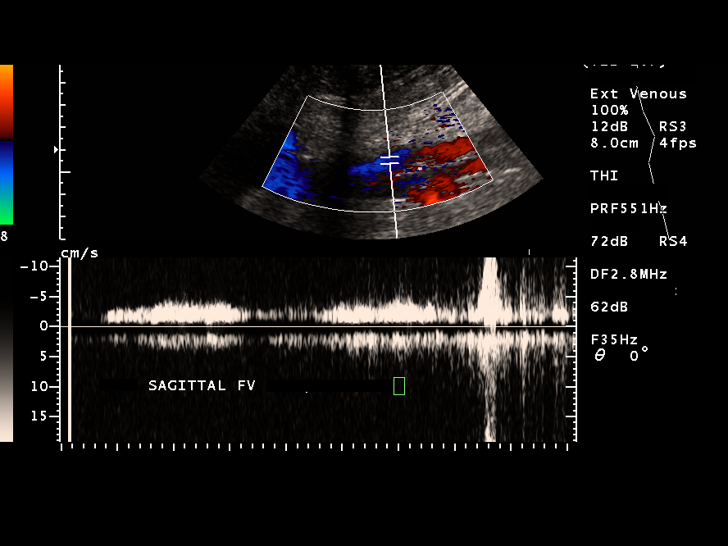
[im 22/37]
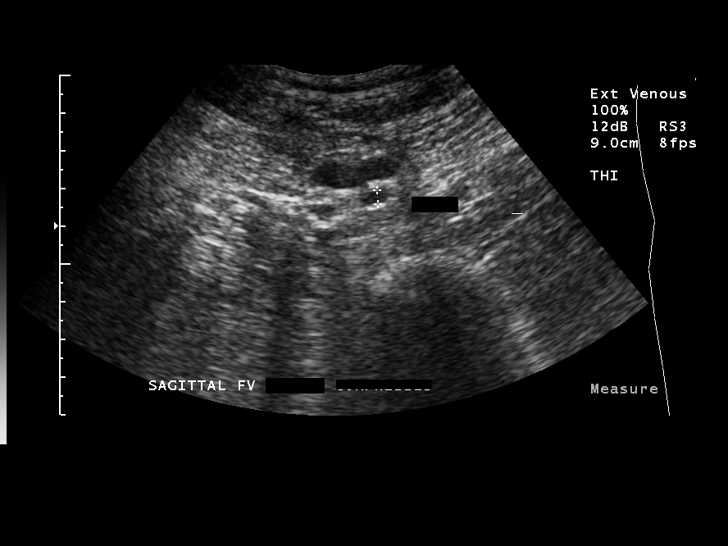
[im 26/37]
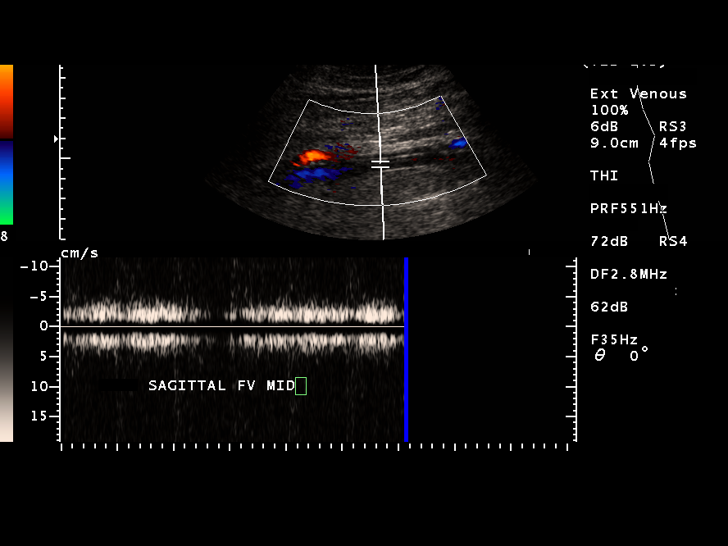
[im 29/37]
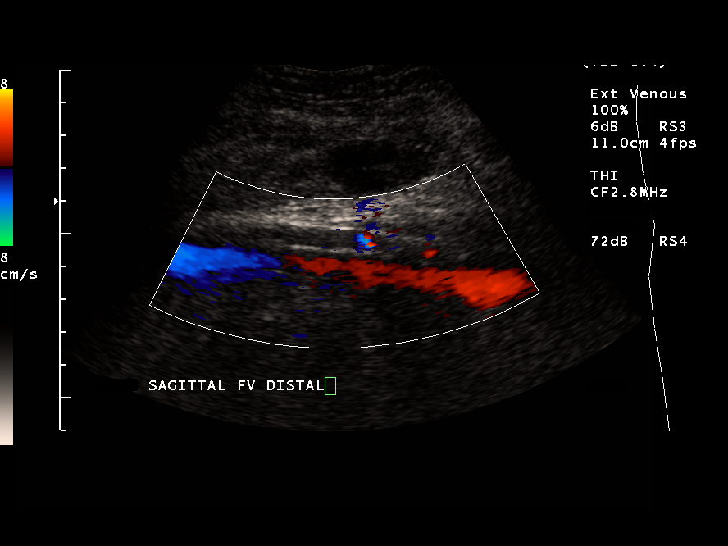
[im 30/37]
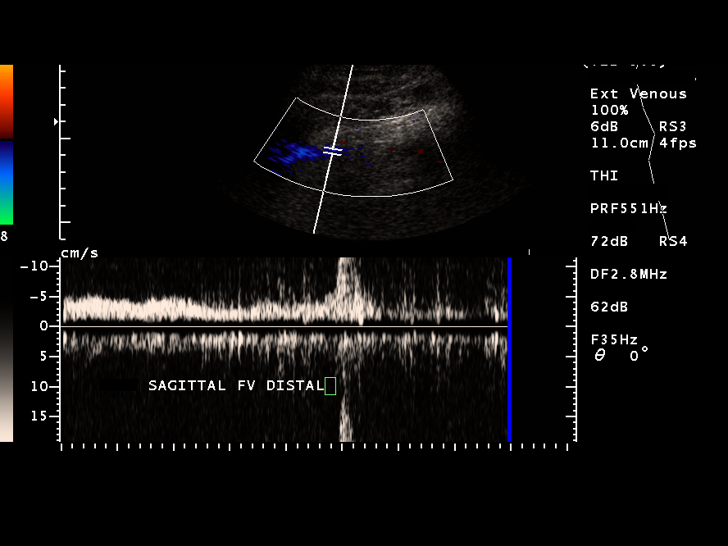
[im 33/37]
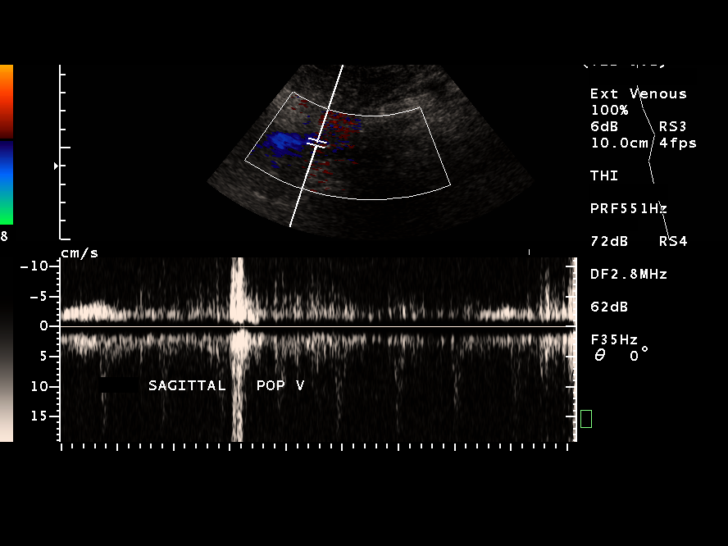
[im 37/37]
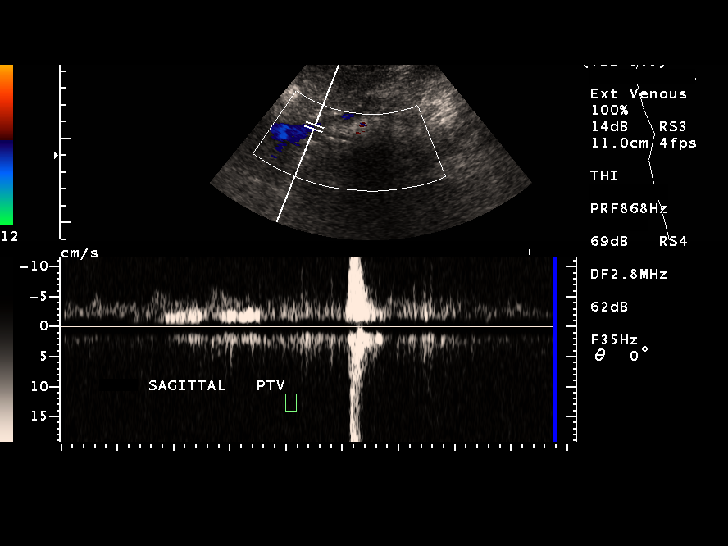

[14 of 24 positions shown; findings below may reference images not displayed]

FINDINGS: Findings compatible DVT involving the profunda femoral
and distal common femoral veins.  The veins are partially occluded.
Study was somewhat limited due to the patient's body habitus.
IMPRESSION: Left lower extremity DVT.

## 2010-10-18 ENCOUNTER — Ambulatory Visit (HOSPITAL_COMMUNITY): Payer: Self-pay

## 2010-11-07 ENCOUNTER — Encounter (HOSPITAL_COMMUNITY): Payer: BC Managed Care – PPO | Admitting: Oncology

## 2010-11-15 ENCOUNTER — Other Ambulatory Visit (HOSPITAL_COMMUNITY): Payer: Self-pay | Admitting: Oncology

## 2010-11-15 ENCOUNTER — Encounter (HOSPITAL_COMMUNITY): Payer: BC Managed Care – PPO | Attending: Oncology | Admitting: Oncology

## 2010-11-15 DIAGNOSIS — C8589 Other specified types of non-Hodgkin lymphoma, extranodal and solid organ sites: Secondary | ICD-10-CM | POA: Insufficient documentation

## 2010-11-15 DIAGNOSIS — E119 Type 2 diabetes mellitus without complications: Secondary | ICD-10-CM | POA: Insufficient documentation

## 2010-11-15 DIAGNOSIS — Z86718 Personal history of other venous thrombosis and embolism: Secondary | ICD-10-CM | POA: Insufficient documentation

## 2010-11-15 DIAGNOSIS — D6859 Other primary thrombophilia: Secondary | ICD-10-CM | POA: Insufficient documentation

## 2010-11-15 DIAGNOSIS — C8590 Non-Hodgkin lymphoma, unspecified, unspecified site: Secondary | ICD-10-CM

## 2010-11-15 LAB — CBC
MCHC: 34.3 g/dL (ref 30.0–36.0)
Platelets: 140 10*3/uL — ABNORMAL LOW (ref 150–400)
RDW: 12.2 % (ref 11.5–15.5)
WBC: 8.3 10*3/uL (ref 4.0–10.5)

## 2010-11-15 LAB — DIFFERENTIAL
Basophils Absolute: 0 10*3/uL (ref 0.0–0.1)
Eosinophils Relative: 3 % (ref 0–5)
Lymphocytes Relative: 23 % (ref 12–46)
Neutro Abs: 5.5 10*3/uL (ref 1.7–7.7)
Neutrophils Relative %: 67 % (ref 43–77)

## 2010-11-15 LAB — COMPREHENSIVE METABOLIC PANEL
ALT: 25 U/L (ref 0–53)
AST: 23 U/L (ref 0–37)
Alkaline Phosphatase: 65 U/L (ref 39–117)
CO2: 24 mEq/L (ref 19–32)
Calcium: 9.3 mg/dL (ref 8.4–10.5)
Chloride: 101 mEq/L (ref 96–112)
GFR calc non Af Amer: >60 mL/min (ref >60)
Potassium: 4 mEq/L (ref 3.5–5.1)
Sodium: 136 mEq/L (ref 135–145)

## 2010-11-19 ENCOUNTER — Ambulatory Visit (HOSPITAL_COMMUNITY)
Admission: RE | Admit: 2010-11-19 | Discharge: 2010-11-19 | Disposition: A | Discharge status: Home / Self Care | Payer: BC Managed Care – PPO | Source: Ambulatory Visit | Attending: Oncology | Admitting: Oncology

## 2010-11-19 ENCOUNTER — Other Ambulatory Visit (HOSPITAL_COMMUNITY): Payer: BC Managed Care – PPO | Admitting: Dentistry

## 2010-11-19 ENCOUNTER — Encounter (HOSPITAL_COMMUNITY): Payer: Self-pay

## 2010-11-19 DIAGNOSIS — K029 Dental caries, unspecified: Secondary | ICD-10-CM

## 2010-11-19 DIAGNOSIS — K083 Retained dental root: Secondary | ICD-10-CM

## 2010-11-19 DIAGNOSIS — K0530 Chronic periodontitis, unspecified: Secondary | ICD-10-CM

## 2010-11-19 DIAGNOSIS — K045 Chronic apical periodontitis: Secondary | ICD-10-CM

## 2010-11-19 DIAGNOSIS — R599 Enlarged lymph nodes, unspecified: Secondary | ICD-10-CM | POA: Insufficient documentation

## 2010-11-19 DIAGNOSIS — E119 Type 2 diabetes mellitus without complications: Secondary | ICD-10-CM | POA: Insufficient documentation

## 2010-11-19 DIAGNOSIS — C8589 Other specified types of non-Hodgkin lymphoma, extranodal and solid organ sites: Secondary | ICD-10-CM | POA: Insufficient documentation

## 2010-11-19 DIAGNOSIS — C8590 Non-Hodgkin lymphoma, unspecified, unspecified site: Secondary | ICD-10-CM

## 2010-11-19 MED ORDER — IOHEXOL 300 MG/ML  SOLN
125.0000 mL | Freq: Once | INTRAMUSCULAR | Status: AC | PRN
  Administered 2010-11-19: 125 mL via INTRAVENOUS

## 2010-11-25 ENCOUNTER — Encounter: Payer: Self-pay | Admitting: Adult Health

## 2010-11-25 ENCOUNTER — Ambulatory Visit (INDEPENDENT_AMBULATORY_CARE_PROVIDER_SITE_OTHER): Payer: BC Managed Care – PPO | Admitting: Adult Health

## 2010-11-25 ENCOUNTER — Encounter: Payer: Self-pay | Admitting: *Deleted

## 2010-11-25 DIAGNOSIS — I10 Essential (primary) hypertension: Secondary | ICD-10-CM

## 2010-11-25 DIAGNOSIS — E785 Hyperlipidemia, unspecified: Secondary | ICD-10-CM

## 2010-11-25 DIAGNOSIS — I2510 Atherosclerotic heart disease of native coronary artery without angina pectoris: Secondary | ICD-10-CM

## 2010-11-25 NOTE — Assessment & Plan Note (Signed)
He is advised on low cholesterol diet. He is working on this himself and is slowly losing weight.

## 2010-11-25 NOTE — Assessment & Plan Note (Signed)
Currently well controlled on current medication regimen. No changes at this time. 

## 2010-11-25 NOTE — Patient Instructions (Addendum)
Your physician recommends that you schedule a follow-up appointment in: 6 months  

## 2010-11-25 NOTE — Assessment & Plan Note (Signed)
He is asymptomatic and stable from cardiac standpoint.  The case has been reviewed and discussed with Dr. Diona Browner on site. He is of low risk for cardiac event with pending dental extraction surgery under general anesthesia. He will follow-up with Dr. Diona Browner in 6 months unless he is symptomatic.  He is advised to continue risk factor modification and wt management.

## 2010-11-25 NOTE — Progress Notes (Signed)
HPI: Kelly [name] is a 55 y/o morbidly obese patient of Dr. Diona Browner we are seeing at the request of Dr. Kristin Bruins, dental surgeon, for pre-op evaluation for full mouth dental extraction requiring anesthesia. This is to be completed on November 28, 2010. [name] has a history of  MI in 2001 with follow-up stress myoview in 05/2009 demonstrating no significant stress-induced EKG abnormalities, by scintigraphic imaging, there was inferior wall scarring with some superimposed ischemia. He is here today without complaints of chest pain, DOE, dizziness, diaphoresis or weakness.  He is trying to lose weight and has lost 10 lbs within the last few weeks. He is medically complaint.  He also has history of Non-Hogdkins Lymphoma and has had a recent full body CT scan demonstrating no evidence of adenopathy at this time.   Allergies not on file  Current Outpatient Prescriptions  Medication Sig Dispense Refill  . glyBURIDE (DIABETA) 5 MG tablet Take 10 mg by mouth 2 (two) times daily.        . insulin glargine (LANTUS) 100 UNIT/ML injection Inject into the skin at bedtime. 55 units every am and 35 units every pm       . metFORMIN (GLUCOPHAGE) 500 MG tablet Take 1,000 mg by mouth 2 (two) times daily with a meal.        . metoprolol (LOPRESSOR) 50 MG tablet Take 25 mg by mouth 2 (two) times daily.        Marland Kitchen omeprazole (PRILOSEC) 20 MG capsule Take 20 mg by mouth 2 (two) times daily.        . ramipril (ALTACE) 5 MG capsule Take 5 mg by mouth daily.        . simvastatin (ZOCOR) 10 MG tablet Take 10 mg by mouth at bedtime.          Past Medical History  Diagnosis Date  . Cancer   . Diabetes mellitus   . CAD (coronary artery disease)     BMS LAD and PTCA RCA 2000,BMS RCA/PLA 2001  . MI (myocardial infarction) 2001  . Hyperlipidemia   . Non Hodgkin's lymphoma   . GERD (gastroesophageal reflux disease)     Past Surgical History  Procedure Date  . Inguinal lymph node     right inguinal lymph node biopsy  . Back  surgery     multiple  . Neck surgery   . Axillary abscess     left incision and drainage left    YWV:PXTGGY of systems complete and found to be negative unless listed above PHYSICAL EXAM BP 131/73  Pulse 76  Ht 6\' 4"  (1.93 m)  Wt 317 lb (143.79 kg)  BMI 38.59 kg/m2  SpO2 95% General: Well developed, well nourished, in no acute distress Head: Eyes PERRLA, No xanthomas.   Normal cephalic and atramatic  Lungs: Clear bilaterally to auscultation and percussion. Heart: HRRR S1 S2,1/6 systolic murmur  Pulses are 2+ & equal.            No carotid bruit. No JVD.  No abdominal bruits. No femoral bruits. Abdomen: Bowel sounds are positive, abdomen soft and non-tender without masses or            Ventral hernia is noted. Msk:  Back normal, normal gait. Normal strength and tone for age. Extremities: No clubbing, cyanosis or edema.  DP +1 Neuro: Alert and oriented X 3. Psych:  Good affect, responds appropriately  IRS:WNIOE rhythm with 1st degree AV block. RBBB, LVH (unchanged since January 2011).  ASSESSMENT  AND PLAN

## 2010-11-28 ENCOUNTER — Ambulatory Visit (HOSPITAL_COMMUNITY): Payer: BC Managed Care – PPO

## 2010-11-28 ENCOUNTER — Ambulatory Visit (HOSPITAL_COMMUNITY)
Admission: RE | Admit: 2010-11-28 | Discharge: 2010-11-28 | Disposition: A | Discharge status: Home / Self Care | Payer: BC Managed Care – PPO | Source: Ambulatory Visit | Attending: Dentistry | Admitting: Dentistry

## 2010-11-28 DIAGNOSIS — C8589 Other specified types of non-Hodgkin lymphoma, extranodal and solid organ sites: Secondary | ICD-10-CM | POA: Insufficient documentation

## 2010-11-28 DIAGNOSIS — K045 Chronic apical periodontitis: Secondary | ICD-10-CM | POA: Insufficient documentation

## 2010-11-28 DIAGNOSIS — K083 Retained dental root: Secondary | ICD-10-CM

## 2010-11-28 DIAGNOSIS — K0530 Chronic periodontitis, unspecified: Secondary | ICD-10-CM

## 2010-11-28 DIAGNOSIS — Z01812 Encounter for preprocedural laboratory examination: Secondary | ICD-10-CM | POA: Insufficient documentation

## 2010-11-28 DIAGNOSIS — Z01818 Encounter for other preprocedural examination: Secondary | ICD-10-CM | POA: Insufficient documentation

## 2010-11-28 DIAGNOSIS — I2510 Atherosclerotic heart disease of native coronary artery without angina pectoris: Secondary | ICD-10-CM | POA: Insufficient documentation

## 2010-11-28 DIAGNOSIS — Z01810 Encounter for preprocedural cardiovascular examination: Secondary | ICD-10-CM | POA: Insufficient documentation

## 2010-11-28 LAB — CBC
Hemoglobin: 14.3 g/dL (ref 13.0–17.0)
MCH: 33.3 pg (ref 26.0–34.0)
MCHC: 35.8 g/dL (ref 30.0–36.0)
Platelets: 134 10*3/uL — ABNORMAL LOW (ref 150–400)
RDW: 12.2 % (ref 11.5–15.5)

## 2010-11-28 LAB — GLUCOSE, CAPILLARY: Glucose-Capillary: 171 mg/dL — ABNORMAL HIGH (ref 70–99)

## 2010-11-28 LAB — BASIC METABOLIC PANEL
Calcium: 8.8 mg/dL (ref 8.4–10.5)
GFR calc Af Amer: >60 mL/min (ref >60)
GFR calc non Af Amer: >60 mL/min (ref >60)
Potassium: 3.8 mEq/L (ref 3.5–5.1)
Sodium: 139 mEq/L (ref 135–145)

## 2010-11-28 LAB — PROTIME-INR
INR: 1.03 (ref 0.00–1.49)
Prothrombin Time: 13.7 seconds (ref 11.6–15.2)

## 2010-11-28 LAB — APTT: aPTT: 44 seconds — ABNORMAL HIGH (ref 24–37)

## 2010-11-29 NOTE — Op Note (Signed)
NAME:  Kelly [name], Kelly [name]              ACCOUNT NO.:  [id number]  MEDICAL RECORD NO.:  [id number]  LOCATION:  [facility]                         FACILITY:  [facility]  PHYSICIAN:  Cindra Eves, D.D.S.DATE OF BIRTH:  1956-04-04  DATE OF PROCEDURE:  11/28/2010 DATE OF DISCHARGE:  11/28/2010                              OPERATIVE REPORT   PREOPERATIVE DIAGNOSES: 1. Non-Hodgkin lymphoma. 2. Coronary artery disease. 3. Pre chemoradiation therapy dental protocol. 4. Chronic periodontitis. 5. Apical periodontitis. 6. Retained root segments.  POSTOPERATIVE DIAGNOSES: 1. Non-Hodgkin lymphoma. 2. Coronary artery disease. 3. Pre chemoradiation therapy dental protocol. 4. Chronic periodontitis. 5. Apical periodontitis. 6. Retained root segments.  OPERATIONS: 1. Extraction of remaining teeth (tooth #4, 5, 6, 7, 8, 9, 11, 12, 17,     19, 20, 21, 22, 26, 27, 28, and 29). 2. Four quadrants of alveoloplasty.  SURGEON:  Cindra Eves, DDSASSISTANT:  Debbe Bales (dental assistant).  ANESTHESIA:  General anesthesia via nasoendotracheal tube.  MEDICATIONS: 1. Ancef 2 g IV prior to invasive dental procedures. 2. Local anesthesia with a total utilization of 5 carpules each     containing 34 mg of lidocaine with 0.017 mg of epinephrine as well     as 2 carpules each containing 9 mg of bupivacaine with 0.009 mg of     epinephrine.  SPECIMENS:  There were 17 teeth that were discarded.  DRAINS:  None.  CULTURES:  None.  COMPLICATIONS:  None.  ESTIMATED BLOOD LOSS:  100 mL.  FLUIDS:  1500 mL lactated Ringer solution.  INDICATIONS:  The patient was previously diagnosed with non-Hodgkin lymphoma.  The patient with anticipated chemoradiation therapy in the future.  The patient also with coronary artery disease.  A cardiac clearance consultation was obtained and the patient was cleared for dental surgery.  The patient was examined and treatment planned for extraction of remaining teeth with  alveoloplasty as indicated.  This treatment plan was formulated to decrease the risk and complications associated with dental infection from further affecting the patient's systemic health.  OPERATIVE FINDINGS:  The patient was examined in the operating room #2. The teeth were identified for extraction.  The patient was noted to be affected by chronic periodontitis, apical periodontitis, and multiple retained root segments.  DESCRIPTION OF PROCEDURE:  The patient was brought to the main operating room #2.  The patient was then placed in supine position on the operating room table.  General anesthesia was then induced via nasoendotracheal tube.  The patient was then prepped and draped in the usual manner for dental medicine procedure.  Time-out was performed. The patient was identified and procedures were verified.  A throat pack was placed at this time.  The oral cavity was then thoroughly examined with findings noted above.  The patient was then ready for the dental medicine procedure as follows:  Local anesthesia was administered sequentially with a total utilization of 5 carpules each containing 34 mg of lidocaine with 0.017 mg of epinephrine as well as 2 carpules each containing 9 mg of bupivacaine with 0.009 mg of epinephrine.  The maxillary left and right quadrants were first approached. Anesthesia delivered via infiltration with lidocaine with epinephrine. At this point in time,  a 15-blade incision was made from the distal of #3 and extended to the distal of #14 a surgical flap was then carefully reflected.  Appropriate amounts of buccal and interseptal bone were removed with a surgical handpiece and bur and copious amounts of sterile saline.  The teeth were then subluxated with a series of straight elevators.  Retained roots in the area of 4 and 5 were then removed with rongeurs without complications.  Tooth #6, 7, 8, 9, 11, and 12 were then removed with a 150 forceps  leaving the retained roots in the area of 11 and 12.  Further bone was then removed appropriately and the roots were elevated out without further complication.  Alveoplasty was then performed utilizing rongeurs and bone file.  The tissues were approximated and trimmed appropriately.  Surgical sites were then irrigated with copious amounts of sterile saline x2.  The maxillary right surgical site was then closed from distal #3 and extended to the mesial #8, utilizing 3-0 chromic gut suture in continuous interrupted suture technique x1.  The maxillary left surgical site was then closed from distal #14 extended to the mesial #9, utilizing 3-0 chromic gut suture in a continuous interrupted suture technique x1.  Two individual interrupted sutures were then placed to further close the surgical site..  At this point in time, the mandibular quadrants were approached.  The patient was given bilateral inferior alveolar nerve blocks utilizing bupivacaine with epinephrine.  Further infiltration was then achieved utilizing lidocaine with epinephrine.  At this point in time, A 15-blade incision was made from the distal of #30 and extended to the mesial of #25.  Surgical flap was then carefully reflected.  Appropriate amounts of buccal and interseptal bone were removed with a surgical handpiece and bur and copious amounts of sterile saline.  Teeth were then subluxated with a series of straight elevators.  Tooth #26, 27, 28, and 29 were then removed with a 151 forceps without complications. Alveoplasty was then performed utilizing rongeurs and bone file. Tissues were approximated and trimmed appropriately.  Surgical site was then irrigated with copious amounts of sterile saline.  Surgical site was then closed from distal #30 and extended to the mesial #25, utilizing 3-0 chromic gut suture in a continuous interrupted suture technique x1.  At this point in time, the mandibular left quadrant was  approached.  A 15-blade incision was made from distal #17 and extended to the mesial #23.  Surgical flap was then carefully reflected.  Appropriate amounts of buccal and interseptal bone was removed appropriately.  The teeth were then subluxated with a series of straight elevators.  Tooth #17 and 19 were then removed with a 17 forceps without complications.  Tooth #20, 21, and 22 were then removed with a 151 forceps without complications.  Alveoplasty was then performed utilizing rongeurs and bone file.  The tissues were approximated and trimmed appropriately. The surgical site was then irrigated with copious amounts of sterile saline.  Surgical site was then closed from distal #17 and extended to the mesial #23, utilizing 3-0 chromic gut suture in a continuous interrupted suture technique x1.  At this point in time, the entire mouth was irrigated with copious amounts of sterile saline.  The patient was examined for complications, seeing none, dental medicine procedure deemed to be complete.  The throat pack was removed at this time.  A series of 4x4 gauze were placed in the mouth to aid hemostasis.  An oral airway was then placed at the request  of the Anesthesia Team.  After appropriate amount of time, the patient was extubated and taken to the Post Anesthesia Care Unit with stable vital signs and in good condition.  All counts were correct for dental medicine procedure.  The patient will be seen approximately 7-10 days for evaluation for suture removal.          ______________________________ Cindra Eves, D.D.S.     RK/MEDQ  D:  11/28/2010  T:  11/28/2010  Job:  [id number]  cc:   Ladona Horns. Mariel Sleet, MD Jonelle Sidle, MD  Electronically Signed by Cindra Eves D.D.S. on 11/29/2010 10:40:22 AM

## 2010-12-09 DIAGNOSIS — K08109 Complete loss of teeth, unspecified cause, unspecified class: Secondary | ICD-10-CM

## 2010-12-19 ENCOUNTER — Encounter: Payer: Self-pay | Admitting: Adult Health

## 2010-12-22 ENCOUNTER — Other Ambulatory Visit: Payer: Self-pay | Admitting: Cardiology

## 2011-01-27 ENCOUNTER — Telehealth (HOSPITAL_COMMUNITY): Payer: Self-pay | Admitting: Oncology

## 2011-02-11 LAB — COMPREHENSIVE METABOLIC PANEL
ALT: 27
AST: 27
Calcium: 9.4
Creatinine, Ser: 1.09
GFR calc Af Amer: >60
GFR calc non Af Amer: >60
Sodium: 137
Total Protein: 6.7

## 2011-02-11 LAB — DIFFERENTIAL
Eosinophils Absolute: 0.3
Eosinophils Relative: 4
Lymphocytes Relative: 21
Lymphs Abs: 1.4
Monocytes Relative: 8
Neutrophils Relative %: 67

## 2011-02-11 LAB — LACTATE DEHYDROGENASE: LDH: 113

## 2011-02-11 LAB — CBC
MCHC: 35.8
MCV: 97.5
RDW: 12.1

## 2011-02-11 LAB — SEDIMENTATION RATE: Sed Rate: 12

## 2011-02-17 LAB — COMPREHENSIVE METABOLIC PANEL
ALT: 25
Alkaline Phosphatase: 49
BUN: 11
CO2: 24
Chloride: 100
GFR calc non Af Amer: >60
Glucose, Bld: 154 — ABNORMAL HIGH
Potassium: 3.7
Sodium: 136
Total Bilirubin: 0.9

## 2011-02-17 LAB — DIFFERENTIAL
Basophils Absolute: 0
Basophils Relative: 1
Eosinophils Absolute: 0.2
Monocytes Relative: 8
Neutro Abs: 4.8
Neutrophils Relative %: 72

## 2011-02-17 LAB — CBC
HCT: 42.7
Hemoglobin: 15.1
RBC: 4.42
RDW: 12.2

## 2011-02-17 LAB — LACTATE DEHYDROGENASE: LDH: 99

## 2011-02-21 ENCOUNTER — Telehealth (HOSPITAL_COMMUNITY): Payer: Self-pay | Admitting: Oncology

## 2011-02-27 LAB — COMPREHENSIVE METABOLIC PANEL
ALT: 30
AST: 26
Albumin: 3.9
Chloride: 103
Creatinine, Ser: 0.93
GFR calc Af Amer: >60
Sodium: 134 — ABNORMAL LOW
Total Bilirubin: 0.7

## 2011-02-27 LAB — CBC
MCV: 97.1
Platelets: 131 — ABNORMAL LOW
RBC: 3.84 — ABNORMAL LOW
WBC: 6.4

## 2011-02-27 LAB — DIFFERENTIAL
Basophils Absolute: 0
Basophils Relative: 0
Eosinophils Absolute: 0.2
Eosinophils Relative: 3
Monocytes Absolute: 0.5
Monocytes Relative: 8
Neutro Abs: 4.3

## 2011-05-16 ENCOUNTER — Ambulatory Visit (HOSPITAL_COMMUNITY): Payer: BC Managed Care – PPO | Admitting: Oncology

## 2011-05-16 ENCOUNTER — Encounter (HOSPITAL_COMMUNITY): Payer: BC Managed Care – PPO | Admitting: Oncology

## 2011-05-16 ENCOUNTER — Encounter (HOSPITAL_COMMUNITY): Payer: Self-pay | Admitting: Oncology

## 2011-05-16 DIAGNOSIS — C8590 Non-Hodgkin lymphoma, unspecified, unspecified site: Secondary | ICD-10-CM | POA: Insufficient documentation

## 2011-05-16 DIAGNOSIS — R760 Raised antibody titer: Secondary | ICD-10-CM | POA: Insufficient documentation

## 2011-05-16 DIAGNOSIS — K259 Gastric ulcer, unspecified as acute or chronic, without hemorrhage or perforation: Secondary | ICD-10-CM | POA: Insufficient documentation

## 2011-05-19 NOTE — Progress Notes (Signed)
This encounter was created in error - please disregard.

## 2011-05-23 ENCOUNTER — Encounter (HOSPITAL_COMMUNITY): Payer: Self-pay | Admitting: Oncology

## 2011-05-23 ENCOUNTER — Encounter (HOSPITAL_COMMUNITY): Payer: BC Managed Care – PPO | Attending: Oncology | Admitting: Oncology

## 2011-05-23 VITALS — BP 144/74 | HR 76 | Temp 97.6°F | Wt 327.6 lb

## 2011-05-23 DIAGNOSIS — C8589 Other specified types of non-Hodgkin lymphoma, extranodal and solid organ sites: Secondary | ICD-10-CM | POA: Insufficient documentation

## 2011-05-23 DIAGNOSIS — C8590 Non-Hodgkin lymphoma, unspecified, unspecified site: Secondary | ICD-10-CM

## 2011-05-23 DIAGNOSIS — Z6830 Body mass index (BMI) 30.0-30.9, adult: Secondary | ICD-10-CM | POA: Insufficient documentation

## 2011-05-23 DIAGNOSIS — E119 Type 2 diabetes mellitus without complications: Secondary | ICD-10-CM | POA: Insufficient documentation

## 2011-05-23 DIAGNOSIS — R609 Edema, unspecified: Secondary | ICD-10-CM | POA: Insufficient documentation

## 2011-05-23 DIAGNOSIS — I252 Old myocardial infarction: Secondary | ICD-10-CM | POA: Insufficient documentation

## 2011-05-23 DIAGNOSIS — E669 Obesity, unspecified: Secondary | ICD-10-CM

## 2011-05-23 LAB — COMPREHENSIVE METABOLIC PANEL
Albumin: 4 g/dL (ref 3.5–5.2)
BUN: 15 mg/dL (ref 6–23)
Calcium: 10.2 mg/dL (ref 8.4–10.5)
GFR calc Af Amer: >90 mL/min (ref >90)
Glucose, Bld: 219 mg/dL — ABNORMAL HIGH (ref 70–99)
Sodium: 135 mEq/L (ref 135–145)
Total Protein: 7.9 g/dL (ref 6.0–8.3)

## 2011-05-23 LAB — CBC
MCH: 32.8 pg (ref 26.0–34.0)
MCHC: 34.3 g/dL (ref 30.0–36.0)
MCV: 95.5 fL (ref 78.0–100.0)
Platelets: 153 10*3/uL (ref 150–400)
RBC: 4.48 MIL/uL (ref 4.22–5.81)

## 2011-05-23 LAB — DIFFERENTIAL
Basophils Relative: 0 % (ref 0–1)
Eosinophils Absolute: 0.2 10*3/uL (ref 0.0–0.7)
Eosinophils Relative: 2 % (ref 0–5)
Lymphs Abs: 1.8 10*3/uL (ref 0.7–4.0)
Neutrophils Relative %: 70 % (ref 43–77)

## 2011-05-23 LAB — LACTATE DEHYDROGENASE: LDH: 151 U/L (ref 94–250)

## 2011-05-23 NOTE — Progress Notes (Signed)
CC:   Kelly [name], M.D. [name], M.D. [name], MD  DIAGNOSES: 1. Massive obesity, weighing 327 pounds now on a 6 feet and 4 inch     frame, giving him a BMI that is greater than 30. 2. Low-grade non-Hodgkin's lymphoma, asymptomatic.  He was never     symptomatic and never treated because of the lack of issues from     this process. 3. Diabetes mellitus for many years. 4. Myocardial infarction in 2000, status post 5 stents placed by the     [facility]. 5. Longstanding smoking history of 1 pack to 1-1/2 packs of cigarettes     a day for approximately 30 years, quitting in 2000. 6. Leg cramps for many years. 7. Mild chronic lower extremity edema that is trace to 1+ pitting     edema. 8. History of an occasional boil in his armpit on the left. 9. Right inguinal lymph node biopsy in April 2007. 10.Cervical disk resection in 1996. 11.Lumbar disk surgery in 1993 and 1995. 12.Thrombophlebitis of the leg in 1994.  INTERVAL HISTORY:  Kelly [name] is asymptomatic still.  He has no palpable adenopathy that he is aware of.  No B symptomatology.  Unfortunately, he continues to gain weight.  He is still working full-time as a Runner, broadcasting/film/video and his wife is with him today.  The rest of his oncologic review of systems is negative.  Bowels are working pretty well.  He has pretty good control of any reflux symptoms.  His weight is just slowing him down.  He just moves more slowly, he state.  He does not have as much energy, and I think the weight is just for really catching up to him. He went to the bariatric information session and felt that the price of having bariatric surgery might be out of his league, but he has good health insurance through the state.  He states he probably could not afford the co-pay, but I said we, at least, need to look into getting him seen by Dr. Daphine Deutscher for a consultation and see if something can be worked out from the standpoint of [employer] and the  Careers adviser.  Without seeing him, I do not think we will get anywhere, so we are going to, at least, start to investigate what it might cost this gentleman, see if we can find that out, and then see if it would be worth someone's while seeing him from Dr. Ermalene Searing standpoint.  He has not had any blood work for some time.  He had CAT scans in July 2012.  He had a couple small cervical lymph nodes unchanged.  No residual disease in the chest.  No obvious residual disease in the abdomen or pelvis.  I do not think this low-grade lymphoma is really an issue.  I think his other health problems are the issue.  PHYSICAL EXAMINATION TODAY:  Vital Signs:  He weighs 327 pounds.  He is 6 feet and 4 inches tall.  His temperature is normal.  His pulse is 80 and regular.  Respirations 16 and unlabored.  Blood pressure 144/74 in the right arm sitting position.  General Appearance:  He denies any pain.  Lymph nodes:  He has no lymphadenopathy palpable in any location. Lungs:  Clear.  Heart:  Regular rhythm and rate without murmur, rub, or gallop.  Abdomen:  Obese and nontender without obvious masses. Extremities:  He has trace pitting edema of both pretibial areas.  PLAN:  We  will get some baseline blood work.  We will see him back in 6 weeks one way or the other and then try to get him a consult with Dr. Sallyanne Havers if it works out.    ______________________________ Ladona Horns. Mariel Sleet, MD ESN/MEDQ  D:  05/23/2011  T:  05/23/2011  Job:  [id number]

## 2011-05-23 NOTE — Progress Notes (Signed)
This office note has been dictated.

## 2011-05-23 NOTE — Patient Instructions (Signed)
[facility]  Discharge Instructions Kelly [name]  [id number] [birth year]  RECOMMENDATIONS MADE BY THE CONSULTANT AND ANY TEST RESULTS WILL BE SENT TO YOUR REFERRING DOCTOR.   EXAM FINDINGS BY MD TODAY AND SIGNS AND SYMPTOMS TO REPORT TO CLINIC OR PRIMARY MD: Findings as discussed by Dr. Mariel Sleet.    I acknowledge that I have been informed and understand all the instructions given to me and received a copy. I do not have any more questions at this time, but understand that I may call the Specialty Clinic at [facility] at [phone] during business hours should I have any further questions or need assistance in obtaining follow-up care.    __________________________________________  _____________  __________ Signature of Patient or Authorized Representative            Date                   Time    __________________________________________ Nurse's Signature

## 2011-05-30 ENCOUNTER — Telehealth (HOSPITAL_COMMUNITY): Payer: Self-pay | Admitting: Oncology

## 2011-06-11 ENCOUNTER — Telehealth (HOSPITAL_COMMUNITY): Payer: Self-pay | Admitting: Oncology

## 2011-06-12 ENCOUNTER — Telehealth (HOSPITAL_COMMUNITY): Payer: Self-pay | Admitting: Oncology

## 2011-06-29 ENCOUNTER — Other Ambulatory Visit: Payer: Self-pay | Admitting: Cardiology

## 2011-06-30 ENCOUNTER — Other Ambulatory Visit (HOSPITAL_COMMUNITY): Payer: Self-pay | Admitting: Oncology

## 2011-06-30 ENCOUNTER — Telehealth (HOSPITAL_COMMUNITY): Payer: Self-pay

## 2011-06-30 DIAGNOSIS — R609 Edema, unspecified: Secondary | ICD-10-CM

## 2011-06-30 MED ORDER — SPIRONOLACTONE 50 MG PO TABS: 50.0000 mg | ORAL_TABLET | Freq: Every day | ORAL | Status: DC

## 2011-06-30 NOTE — Telephone Encounter (Signed)
Call from patient's wife requesting a refill for Spironolactone 50 mg daily.  States "[facility] said they had sent a request but have not heard anything back, and I would like to pick it up this afternoon." Uses [facility] pharmacy in [place].

## 2011-08-25 ENCOUNTER — Encounter: Payer: Self-pay | Admitting: Cardiology

## 2011-09-12 ENCOUNTER — Ambulatory Visit (INDEPENDENT_AMBULATORY_CARE_PROVIDER_SITE_OTHER): Payer: BC Managed Care – PPO | Admitting: Cardiology

## 2011-09-12 ENCOUNTER — Encounter: Payer: Self-pay | Admitting: Cardiology

## 2011-09-12 VITALS — BP 132/77 | HR 81 | Resp 18 | Ht 75.0 in | Wt 330.0 lb

## 2011-09-12 DIAGNOSIS — R072 Precordial pain: Secondary | ICD-10-CM

## 2011-09-12 DIAGNOSIS — E785 Hyperlipidemia, unspecified: Secondary | ICD-10-CM

## 2011-09-12 DIAGNOSIS — C8589 Other specified types of non-Hodgkin lymphoma, extranodal and solid organ sites: Secondary | ICD-10-CM

## 2011-09-12 DIAGNOSIS — I2510 Atherosclerotic heart disease of native coronary artery without angina pectoris: Secondary | ICD-10-CM

## 2011-09-12 DIAGNOSIS — C8590 Non-Hodgkin lymphoma, unspecified, unspecified site: Secondary | ICD-10-CM

## 2011-09-12 DIAGNOSIS — I10 Essential (primary) hypertension: Secondary | ICD-10-CM

## 2011-09-12 NOTE — Assessment & Plan Note (Signed)
Blood pressure is reasonable.

## 2011-09-12 NOTE — Assessment & Plan Note (Signed)
Current plan at this point is to followup on ischemic testing, specifically a 2 day Lexiscan Myoview on medical therapy. If he has had progression of ischemic burden beyond overall low risk testing, may need further evaluation. Otherwise we might consider adding Imdur and following him clinically.

## 2011-09-12 NOTE — Assessment & Plan Note (Signed)
Recent LDL 45 on statin therapy.

## 2011-09-12 NOTE — Progress Notes (Signed)
   Clinical Summary Kelly [name] is a 56 y.o.male presenting for followup. He was seen by Ms. Lawrence in July 2012 prior to dental extractions. He is here with his wife today. He reports some recent episodes of angina with exertion - was unloading a truck. Had to rest and did not require nitroglycerin.  Last ischemic testing was via Myoview in 1/11 showing combination of inferior scar and ischemia, LVEF 53%. He has been managed medically.  He indicates compliance with his medications. Has new nitroglycerin tablets.  It seems that his low grade lymphoma has been quiescent based on Dr. Thornton Papas notes.  No Known Allergies  Current Outpatient Prescriptions  Medication Sig Dispense Refill  . aspirin 81 MG chewable tablet Chew 81 mg by mouth daily.        Marland Kitchen glyBURIDE (DIABETA) 5 MG tablet Take 10 mg by mouth 2 (two) times daily.        Marland Kitchen HYDROcodone-acetaminophen (NORCO) 7.5-325 MG per tablet Take 1 tablet by mouth every 6 (six) hours as needed.        . insulin glargine (LANTUS) 100 UNIT/ML injection Inject into the skin at bedtime. 55 units every am and 35 units every pm       . metFORMIN (GLUCOPHAGE) 500 MG tablet Take 1,000 mg by mouth 2 (two) times daily with a meal.        . metoprolol (LOPRESSOR) 50 MG tablet Take 25 mg by mouth 2 (two) times daily.        . nitroGLYCERIN (NITROSTAT) 0.4 MG SL tablet Place 0.4 mg under the tongue every 5 (five) minutes as needed.      . pioglitazone (ACTOS) 15 MG tablet Take 15 mg by mouth daily.        . ramipril (ALTACE) 5 MG capsule Take 5 mg by mouth daily.        . simvastatin (ZOCOR) 10 MG tablet TAKE 1 TABLET BY MOUTH AT BEDTIME  30 tablet  2  . spironolactone (ALDACTONE) 50 MG tablet Take 1 tablet (50 mg total) by mouth daily.  30 tablet  1    Past Medical History  Diagnosis Date  . Type 2 diabetes mellitus   . Coronary atherosclerosis of native coronary artery     BMS LAD and PTCA RCA 2000,BMS RCA/PLA 2001  . MI (myocardial infarction)  2001  . Hyperlipidemia   . GERD (gastroesophageal reflux disease)   . Low-grade NHL (non-Hodgkin's lymphoma)   . Lupus anticoagulant positive   . Gastric ulcer     Social History Kelly [name] reports that he has quit smoking. His smoking use included Cigarettes. He has quit using smokeless tobacco. Kelly [name] reports that he does not drink alcohol.  Review of Systems Weakness, still struggles to lose weight. No regular exercise. Otherwise negative.  Physical Examination Filed Vitals:   09/12/11 1517  BP: 132/77  Pulse: 81  Resp: 18   Morbidly obese male in NAD. HEENT: Conjunctiva and lids normal, oropharynx clear. Neck: Increased girth, no elevated JVP or carotid bruits, no thyromegaly. Lungs: Clear to auscultation, nonlabored breathing at rest. Cardiac: Regular rate and rhythm, no S3 or significant systolic murmur, no pericardial rub. Abdomen: Soft, protuberant, bowel sounds present, no guarding or rebound. Extremities: No pitting edema, distal pulses 2+. Skin: Warm and dry. Musculoskeletal: No kyphosis. Neuropsychiatric: Alert and oriented x3, affect grossly appropriate.    Problem List and Plan

## 2011-09-12 NOTE — Patient Instructions (Signed)
**Note De-Identified Kelly [name] Obfuscation** Your physician has requested that you have a lexiscan myoview. For further information please visit [email or web address] Please follow instruction sheet, as given.  Your physician recommends that you continue on your current medications as directed. Please refer to the Current Medication list given to you today.  Your physician recommends that you schedule a follow-up appointment in: 6 months (depending on results of test)

## 2011-09-12 NOTE — Assessment & Plan Note (Signed)
Followed by Dr. Demetrios Isaacs.

## 2011-09-17 ENCOUNTER — Ambulatory Visit (INDEPENDENT_AMBULATORY_CARE_PROVIDER_SITE_OTHER): Payer: BC Managed Care – PPO

## 2011-09-17 ENCOUNTER — Encounter (HOSPITAL_COMMUNITY)
Admission: RE | Admit: 2011-09-17 | Discharge: 2011-09-17 | Disposition: A | Discharge status: Home / Self Care | Payer: BC Managed Care – PPO | Source: Ambulatory Visit | Attending: Cardiology | Admitting: Cardiology

## 2011-09-17 ENCOUNTER — Encounter (HOSPITAL_COMMUNITY): Payer: Self-pay

## 2011-09-17 ENCOUNTER — Encounter (HOSPITAL_COMMUNITY): Payer: Self-pay | Admitting: Cardiology

## 2011-09-17 DIAGNOSIS — R072 Precordial pain: Secondary | ICD-10-CM

## 2011-09-17 DIAGNOSIS — I2510 Atherosclerotic heart disease of native coronary artery without angina pectoris: Secondary | ICD-10-CM

## 2011-09-17 DIAGNOSIS — I10 Essential (primary) hypertension: Secondary | ICD-10-CM | POA: Insufficient documentation

## 2011-09-17 DIAGNOSIS — E785 Hyperlipidemia, unspecified: Secondary | ICD-10-CM | POA: Insufficient documentation

## 2011-09-17 DIAGNOSIS — R079 Chest pain, unspecified: Secondary | ICD-10-CM

## 2011-09-17 DIAGNOSIS — R9439 Abnormal result of other cardiovascular function study: Secondary | ICD-10-CM | POA: Insufficient documentation

## 2011-09-17 MED ORDER — TECHNETIUM TC 99M TETROFOSMIN IV KIT
30.0000 | PACK | Freq: Once | INTRAVENOUS | Status: AC | PRN
  Administered 2011-09-17: 30 via INTRAVENOUS

## 2011-09-17 NOTE — Progress Notes (Signed)
Stress Lab Nurses Notes - [name]  Kelly [name] 09/17/2011 Reason for doing test: CAD and Chest Pain Type of test: [name] / 2 day Study Nurse performing test: Parke Poisson, RN Nuclear Medicine Tech: Lou Cal Echo Tech: Not Applicable MD performing test: Ival Bible & Joni Reining NP Family MD: Juanetta Gosling Test explained and consent signed: yes IV started: 22g jelco, Saline lock flushed and No redness or edema Symptoms: " felt heart beating fast" Treatment/Intervention: None Reason test stopped: protocol completed After recovery IV was: Discontinued via X-ray tech and No redness or edema Patient to return to Nuc. Med at : 11:30 Patient discharged: Home Patient's Condition upon discharge was: stable Comments: During test BP 122/60 & HR 88.  Recovery BP 128/68 & HR 78.  Symptoms resolved in recovery. Erskine Speed T

## 2011-09-18 ENCOUNTER — Encounter (HOSPITAL_COMMUNITY)
Admission: RE | Admit: 2011-09-18 | Discharge: 2011-09-18 | Disposition: A | Discharge status: Home / Self Care | Payer: BC Managed Care – PPO | Source: Ambulatory Visit | Attending: Cardiology | Admitting: Cardiology

## 2011-09-18 MED ORDER — TECHNETIUM TC 99M TETROFOSMIN IV KIT
20.0000 | PACK | Freq: Once | INTRAVENOUS | Status: AC | PRN
  Administered 2011-09-18: 25 via INTRAVENOUS

## 2011-09-24 ENCOUNTER — Other Ambulatory Visit: Payer: Self-pay | Admitting: *Deleted

## 2011-09-24 ENCOUNTER — Other Ambulatory Visit: Payer: Self-pay | Admitting: Adult Health

## 2011-09-24 MED ORDER — ISOSORBIDE MONONITRATE ER 30 MG PO TB24: 30.0000 mg | ORAL_TABLET | Freq: Every day | ORAL | Status: DC

## 2011-09-24 NOTE — Telephone Encounter (Signed)
Telephone call received from wife stating that Imdur prescription was not received by [facility], therefore script sent for Imdur 30 mg daily # 30 with 12 refills, as recommended by Dr Diona Browner in stress test note.

## 2011-09-25 ENCOUNTER — Telehealth: Payer: Self-pay | Admitting: Cardiology

## 2011-09-25 NOTE — Telephone Encounter (Signed)
PT STATES THAT DR MCDOWELL PUT HIM ON NITRO TO USE EVERYDAY AND WE HAVE NOT CALLED IT IN TO [facility] IN [place] YET. PLEASE CALL PT WHEN IT HAS BEEN CALLED IN/[facility]J

## 2011-09-26 MED ORDER — NITROGLYCERIN 0.4 MG SL SUBL: 0.4000 mg | SUBLINGUAL_TABLET | SUBLINGUAL | Status: DC | PRN

## 2011-09-26 NOTE — Telephone Encounter (Signed)
lmom/ct

## 2011-10-24 ENCOUNTER — Other Ambulatory Visit (HOSPITAL_COMMUNITY): Payer: Self-pay | Admitting: Oncology

## 2011-10-24 DIAGNOSIS — R609 Edema, unspecified: Secondary | ICD-10-CM

## 2011-10-24 MED ORDER — SPIRONOLACTONE 50 MG PO TABS: 50.0000 mg | ORAL_TABLET | Freq: Every day | ORAL | Status: DC

## 2011-11-23 ENCOUNTER — Other Ambulatory Visit: Payer: Self-pay | Admitting: Adult Health

## 2011-11-24 ENCOUNTER — Ambulatory Visit (HOSPITAL_COMMUNITY): Payer: Self-pay | Admitting: Oncology

## 2011-11-26 ENCOUNTER — Encounter (HOSPITAL_COMMUNITY): Payer: Self-pay | Admitting: Oncology

## 2011-11-26 ENCOUNTER — Encounter (HOSPITAL_COMMUNITY): Payer: BC Managed Care – PPO | Attending: Oncology | Admitting: Oncology

## 2011-11-26 VITALS — BP 104/56 | HR 78 | Temp 97.9°F | Wt 322.7 lb

## 2011-11-26 DIAGNOSIS — C8590 Non-Hodgkin lymphoma, unspecified, unspecified site: Secondary | ICD-10-CM

## 2011-11-26 DIAGNOSIS — I252 Old myocardial infarction: Secondary | ICD-10-CM | POA: Insufficient documentation

## 2011-11-26 DIAGNOSIS — E119 Type 2 diabetes mellitus without complications: Secondary | ICD-10-CM

## 2011-11-26 DIAGNOSIS — E669 Obesity, unspecified: Secondary | ICD-10-CM | POA: Insufficient documentation

## 2011-11-26 DIAGNOSIS — C8589 Other specified types of non-Hodgkin lymphoma, extranodal and solid organ sites: Secondary | ICD-10-CM

## 2011-11-26 DIAGNOSIS — F329 Major depressive disorder, single episode, unspecified: Secondary | ICD-10-CM

## 2011-11-26 DIAGNOSIS — F3289 Other specified depressive episodes: Secondary | ICD-10-CM | POA: Insufficient documentation

## 2011-11-26 LAB — DIFFERENTIAL
Lymphocytes Relative: 23 % (ref 12–46)
Lymphs Abs: 2 10*3/uL (ref 0.7–4.0)
Monocytes Relative: 7 % (ref 3–12)
Neutro Abs: 5.9 10*3/uL (ref 1.7–7.7)
Neutrophils Relative %: 68 % (ref 43–77)

## 2011-11-26 LAB — CBC
HCT: 39.9 % (ref 39.0–52.0)
Hemoglobin: 13.8 g/dL (ref 13.0–17.0)
RBC: 4.16 MIL/uL — ABNORMAL LOW (ref 4.22–5.81)

## 2011-11-26 NOTE — Progress Notes (Signed)
Problem #1 massive obesity weighing 322 pounds on a 6 foot 3 inch frame. Problem #2 low-grade non-Hodgkin's lymphoma still asymptomatic from this not in need of therapy. Problem #3 diabetes mellitus times many years with poor control. Problem #4 MI in 2000 status post 5 stent placed at that time problem #5 depression   other problems are listed in my note from 05/23/2011  [name] still does not have B. symptomatology. His right leg rarely swells anymore and he uses his pressure stockings quite often. He states he has no energy and no desire to get out of walk anymore. He sits around the house does very little if anything when is not at work. He still at least likes to go to work. He cannot afford bariatric surgery states because of the co-pay of $6000. On exam vital signs are recorded weight is down 5 pounds since January. He still has no lymphadenopathy. His obese abdomen without obvious hepatosplenomegaly. Lungs are clear. Heart shows a regular rhythm and rate without murmur rub or gallop. He has trace to just puffiness of the ankles bilaterally. He is in no acute distress but he does look depressed.  So we discussed the issue of depression and his wife thinks that he is clearly depressed as well. He is willing to try amitriptyline 25 mg for 7 nights then 50 mg for 7 nights and then 75 mg he'll take and call me in 6 weeks. Otherwise I will see him back in 6 months sooner if need be. I doubt that he will be in favor of counseling but he might need that and we can discuss that in the future

## 2011-11-26 NOTE — Progress Notes (Signed)
Salvator G Allston presented for labwork. Labs per MD order drawn via Peripheral Line 23 gauge needle inserted in left hand  Good blood return present. Procedure without incident.  Needle removed intact. Patient tolerated procedure well.   

## 2011-11-26 NOTE — Patient Instructions (Addendum)
Kelly [name]  [id number] March 16, 1956 Dr. Glenford Peers [facility]  Discharge Instructions  RECOMMENDATIONS MADE BY THE CONSULTANT AND ANY TEST RESULTS WILL BE SENT TO YOUR REFERRING DOCTOR.   EXAM FINDINGS BY MD TODAY AND SIGNS AND SYMPTOMS TO REPORT TO CLINIC OR PRIMARY MD: Exam and discussion per MD. Would like for you to try to lose weight.  MEDICATIONS PRESCRIBED: Amitriptylline 25 mg take 1 at bedtime for 7 days then 2 at bedtime for 7 days then 3 at bedtime.  Call us in 6 weeks to let us know how you are doing.  Tobie Lords, RN [phone])   INSTRUCTIONS GIVEN AND DISCUSSED: Other :  Report night sweats, new lumps, recurring infections, etc.  SPECIAL INSTRUCTIONS/FOLLOW-UP: Return to Clinic in 6 months.   I acknowledge that I have been informed and understand all the instructions given to me and received a copy. I do not have any more questions at this time, but understand that I may call the Specialty Clinic at [facility] at [phone] during business hours should I have any further questions or need assistance in obtaining follow-up care.    __________________________________________  _____________  __________ Signature of Patient or Authorized Representative            Date                   Time    __________________________________________ Nurse's Signature

## 2011-12-24 ENCOUNTER — Other Ambulatory Visit (HOSPITAL_COMMUNITY): Payer: Self-pay | Admitting: Oncology

## 2012-01-06 ENCOUNTER — Telehealth (HOSPITAL_COMMUNITY): Payer: Self-pay

## 2012-01-06 NOTE — Telephone Encounter (Signed)
Call from [place].  Is taking the amitriptyline 75 mg each day at 7 pm.  States "Sometimes it helps and sometimes it doesn't.  If I take each night @ 7pm I sleep better.  If I take it any later than that I sleep too long and feel sluggish the next day.  Overall, I think that it has helped some.  Dr. Mariel Sleet had asked me to call and let him know how it was going."

## 2012-01-06 NOTE — Telephone Encounter (Signed)
Message copied by Evelena Leyden on Tue Jan 06, 2012  6:03 PM ------      Message from: Mariel Sleet, ERIC S      Created: Tue Jan 06, 2012  5:21 PM       Continue amitryptylline and call us back in 6 weeks with update.

## 2012-01-06 NOTE — Telephone Encounter (Signed)
Message left with instructions to continue amitriptyline and to call us with update in 6 weesks

## 2012-01-23 ENCOUNTER — Other Ambulatory Visit: Payer: Self-pay | Admitting: Cardiology

## 2012-02-21 ENCOUNTER — Other Ambulatory Visit: Payer: Self-pay | Admitting: Cardiology

## 2012-02-22 ENCOUNTER — Other Ambulatory Visit (HOSPITAL_COMMUNITY): Payer: Self-pay | Admitting: Oncology

## 2012-03-22 ENCOUNTER — Other Ambulatory Visit (HOSPITAL_COMMUNITY): Payer: Self-pay | Admitting: Oncology

## 2012-03-24 ENCOUNTER — Other Ambulatory Visit (HOSPITAL_COMMUNITY): Payer: Self-pay | Admitting: Oncology

## 2012-03-30 ENCOUNTER — Telehealth (HOSPITAL_COMMUNITY): Payer: Self-pay

## 2012-03-30 ENCOUNTER — Other Ambulatory Visit: Payer: Self-pay | Admitting: Cardiology

## 2012-03-30 ENCOUNTER — Other Ambulatory Visit (HOSPITAL_COMMUNITY): Payer: Self-pay

## 2012-03-30 DIAGNOSIS — F329 Major depressive disorder, single episode, unspecified: Secondary | ICD-10-CM

## 2012-03-30 MED ORDER — FLUOXETINE HCL 20 MG PO CAPS: 20.0000 mg | ORAL_CAPSULE | Freq: Every day | ORAL | Status: DC

## 2012-03-30 NOTE — Telephone Encounter (Signed)
Spoke with patient about switching from Amitriptyline to Prozac.  Per patient "If Dr. Mariel Sleet thinks it would be best for me to take it instead of the amitriptyline, I will do it."  Uses [facility] in [place].

## 2012-03-30 NOTE — Telephone Encounter (Signed)
Message copied by Evelena Leyden on Tue Mar 30, 2012  5:36 PM ------      Message from: Mariel Sleet, ERIC S      Created: Tue Mar 30, 2012  4:55 PM       prozac 20 mg daily      #30 REF X 3      Stop amitryptylline first for 2 days

## 2012-03-30 NOTE — Telephone Encounter (Signed)
Patient notified to stop the amitriptyline and after being off for 2 days to start taking the Prozac.  Verbalized understanding of instructions.  Rx e-scribed to [facility].

## 2012-04-02 ENCOUNTER — Telehealth (HOSPITAL_COMMUNITY): Payer: Self-pay | Admitting: *Deleted

## 2012-04-02 NOTE — Telephone Encounter (Signed)
Called to [facility] due to pt says that they did not get it through escribe.

## 2012-04-23 ENCOUNTER — Other Ambulatory Visit (HOSPITAL_COMMUNITY): Payer: Self-pay | Admitting: Oncology

## 2012-04-23 DIAGNOSIS — F32A Depression, unspecified: Secondary | ICD-10-CM

## 2012-04-23 DIAGNOSIS — F329 Major depressive disorder, single episode, unspecified: Secondary | ICD-10-CM

## 2012-04-23 MED ORDER — AMITRIPTYLINE HCL 25 MG PO TABS: 25.0000 mg | ORAL_TABLET | Freq: Every day | ORAL | Status: DC

## 2012-04-23 MED ORDER — AMITRIPTYLINE HCL 25 MG PO TABS: 75.0000 mg | ORAL_TABLET | Freq: Every day | ORAL | Status: DC

## 2012-04-29 ENCOUNTER — Other Ambulatory Visit: Payer: Self-pay | Admitting: Cardiology

## 2012-04-29 NOTE — Telephone Encounter (Signed)
rx sent to pharmacy by e-script for 2 months to allow pt time to have an apt scheduled per:  Noted pt did not have an apt scheduled based on last notation, pt due for 6 month follow up approximately around 03-13-12 Staff message sent to scheduler to advise pt needs to have follow up apt scheduled with [facility]

## 2012-05-18 ENCOUNTER — Other Ambulatory Visit (HOSPITAL_COMMUNITY): Payer: Self-pay | Admitting: Oncology

## 2012-06-04 ENCOUNTER — Encounter (HOSPITAL_COMMUNITY): Payer: Self-pay | Admitting: Oncology

## 2012-06-04 ENCOUNTER — Encounter (HOSPITAL_COMMUNITY): Payer: BC Managed Care – PPO | Attending: Oncology | Admitting: Oncology

## 2012-06-04 VITALS — BP 118/70 | HR 57 | Temp 97.6°F | Resp 18 | Wt 310.0 lb

## 2012-06-04 DIAGNOSIS — R894 Abnormal immunological findings in specimens from other organs, systems and tissues: Secondary | ICD-10-CM

## 2012-06-04 DIAGNOSIS — I252 Old myocardial infarction: Secondary | ICD-10-CM | POA: Insufficient documentation

## 2012-06-04 DIAGNOSIS — E119 Type 2 diabetes mellitus without complications: Secondary | ICD-10-CM | POA: Insufficient documentation

## 2012-06-04 DIAGNOSIS — R760 Raised antibody titer: Secondary | ICD-10-CM

## 2012-06-04 DIAGNOSIS — E669 Obesity, unspecified: Secondary | ICD-10-CM | POA: Insufficient documentation

## 2012-06-04 DIAGNOSIS — Z86718 Personal history of other venous thrombosis and embolism: Secondary | ICD-10-CM

## 2012-06-04 DIAGNOSIS — C8590 Non-Hodgkin lymphoma, unspecified, unspecified site: Secondary | ICD-10-CM

## 2012-06-04 DIAGNOSIS — C8589 Other specified types of non-Hodgkin lymphoma, extranodal and solid organ sites: Secondary | ICD-10-CM

## 2012-06-04 LAB — CBC WITH DIFFERENTIAL/PLATELET
Basophils Absolute: 0.1 10*3/uL (ref 0.0–0.1)
Eosinophils Absolute: 0.2 10*3/uL (ref 0.0–0.7)
Hemoglobin: 13.1 g/dL (ref 13.0–17.0)
Lymphocytes Relative: 27 % (ref 12–46)
MCHC: 35.5 g/dL (ref 30.0–36.0)
Neutrophils Relative %: 61 % (ref 43–77)
RDW: 11.9 % (ref 11.5–15.5)
WBC: 7.5 10*3/uL (ref 4.0–10.5)

## 2012-06-04 LAB — COMPREHENSIVE METABOLIC PANEL
ALT: 27 U/L (ref 0–53)
AST: 28 U/L (ref 0–37)
Albumin: 3.9 g/dL (ref 3.5–5.2)
Alkaline Phosphatase: 51 U/L (ref 39–117)
Potassium: 4.4 mEq/L (ref 3.5–5.1)
Sodium: 135 mEq/L (ref 135–145)
Total Protein: 7.3 g/dL (ref 6.0–8.3)

## 2012-06-04 LAB — LACTATE DEHYDROGENASE: LDH: 240 U/L (ref 94–250)

## 2012-06-04 NOTE — Progress Notes (Signed)
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 Problem #2 massive obesity weighing 310 pounds today. His weight is down 17 pounds since 05/23/2011. Problem #3 diabetes mellitus under better control Problem #4 myocardial infarction in 2000 status post 5 stent is placed at that time Problem #5 right inguinal lymph node biopsy in April 2007 to 7 is a diagnosis and number 1 still with a cystic-like lesion very medially in the right groin without change. Problem #6 intermittence one of his right leg since the biopsy the lymph node April 2007 Problem #7 thrombophlebitis of his leg in 1994 with history of a positive lupus anticoagulant Problem #8 cervical disc resection 1996 Problem #9 lumbar disc surgery 1993 and 1995  problem #10 occasional for all goes left armpit still with a cystic-like lesion approximately 3 x 3 cm posteriorly in the left axilla. Problem number liver long-standing smoking history of one pack to one and one half packs of cigarettes per day for approximately 30 years quitting in 2000  He remains free of B. symptomatology. He still has no palpable adenopathy. He still has had a cystic lesion in the medial right inguinal area below the inguinal ligament which is been present since his biopsy but much smaller these days. It's approximate 6 x 4 cm and cystic in its palpation.  He is trying to take better care of his diabetes he states is trying to lose weight. Vital signs are stable. He has no lymphadenopathy. Lungs are clear. Heart shows a regular rhythm and rate. Did not detect an S3 gallop. There was a question of a grade 1/6 systolic ejection murmur but was very very soft. Abdomen is obese without obvious organomegaly. Bowel sounds are present. He has trace edema of his ankles bilaterally. He is alert and oriented.  The right inguinal cystic mass is unchanged in my opinion. He still does not need any chemotherapy in my opinion for his low-grade lymphoma. I  would like to make sure his hypercoagulable panel is clear at this point in time. We will see him in 6 months otherwise. Sooner if need be

## 2012-06-04 NOTE — Progress Notes (Signed)
Jamse Mead presented for labwork. Labs per MD order drawn via Peripheral Line 23 gauge needle inserted in left hand  Good blood return present. Procedure without incident.  Needle removed intact. Patient tolerated procedure well.

## 2012-06-04 NOTE — Patient Instructions (Addendum)
[facility] Discharge Instructions  RECOMMENDATIONS MADE BY THE CONSULTANT AND ANY TEST RESULTS WILL BE SENT TO YOUR REFERRING PHYSICIAN.  EXAM FINDINGS BY THE PHYSICIAN TODAY AND SIGNS OR SYMPTOMS TO REPORT TO CLINIC OR PRIMARY PHYSICIAN: Exam and discussion by MD.  Lisabeth Devoid the area in your left axilla feel like a cyst.  Will check some blood work today and will let you know if there's anything that is abnormal.  MEDICATIONS PRESCRIBED:  none  INSTRUCTIONS GIVEN AND DISCUSSED: Report recurring infections, night sweats, new lumps or other problems  SPECIAL INSTRUCTIONS/FOLLOW-UP: Return in 6 months for follow-up.  Thank you for choosing [name] to provide your oncology and hematology care.  To afford each patient quality time with our providers, please arrive at least 15 minutes before your scheduled appointment time.  With your help, our goal is to use those 15 minutes to complete the necessary work-up to ensure our physicians have the information they need to help with your evaluation and healthcare recommendations.    Effective January 1st, 2014, we ask that you re-schedule your appointment with our physicians should you arrive 10 or more minutes late for your appointment.  We strive to give you quality time with our providers, and arriving late affects you and other patients whose appointments are after yours.    Again, thank you for choosing [facility].  Our hope is that these requests will decrease the amount of time that you wait before being seen by our physicians.       _____________________________________________________________  Should you have questions after your visit to [facility], please contact our office at [phone] between the hours of 8:30 a.m. and 5:00 p.m.  Voicemails left after 4:30 p.m. will not be returned until the following business day.  For prescription refill requests, have your pharmacy  contact our office with your prescription refill request.

## 2012-06-07 LAB — BETA-2-GLYCOPROTEIN I ABS, IGG/M/A: Beta-2 Glyco I IgG: 0 G Units (ref <20)

## 2012-06-07 LAB — LUPUS ANTICOAGULANT PANEL
DRVVT: 86.2 secs — ABNORMAL HIGH (ref <42.9)
Drvvt confirmation: 1.9 Ratio — ABNORMAL HIGH (ref <1.11)
Lupus Anticoagulant: DETECTED — AB
PTTLA 4:1 Mix: 49.7 secs — ABNORMAL HIGH (ref 28.0–43.0)

## 2012-06-07 LAB — CARDIOLIPIN ANTIBODIES, IGG, IGM, IGA
Anticardiolipin IgA: 10 APL U/mL — ABNORMAL LOW (ref <22)
Anticardiolipin IgG: 7 GPL U/mL — ABNORMAL LOW (ref <23)
Anticardiolipin IgM: 2 MPL U/mL — ABNORMAL LOW (ref <11)

## 2012-06-07 LAB — PROTEIN C ACTIVITY: Protein C Activity: 149 % — ABNORMAL HIGH (ref 75–133)

## 2012-06-08 LAB — FACTOR 5 LEIDEN

## 2012-06-18 ENCOUNTER — Other Ambulatory Visit (HOSPITAL_COMMUNITY): Payer: Self-pay | Admitting: Oncology

## 2012-07-03 ENCOUNTER — Other Ambulatory Visit: Payer: Self-pay

## 2012-07-21 ENCOUNTER — Encounter: Payer: Self-pay | Admitting: Cardiology

## 2012-07-21 ENCOUNTER — Ambulatory Visit (INDEPENDENT_AMBULATORY_CARE_PROVIDER_SITE_OTHER): Payer: BC Managed Care – PPO | Admitting: Cardiology

## 2012-07-21 VITALS — BP 110/60 | HR 64 | Ht 76.0 in | Wt 309.0 lb

## 2012-07-21 DIAGNOSIS — I2510 Atherosclerotic heart disease of native coronary artery without angina pectoris: Secondary | ICD-10-CM

## 2012-07-21 DIAGNOSIS — E785 Hyperlipidemia, unspecified: Secondary | ICD-10-CM

## 2012-07-21 DIAGNOSIS — I10 Essential (primary) hypertension: Secondary | ICD-10-CM

## 2012-07-21 NOTE — Progress Notes (Signed)
Clinical Summary Kelly [name] is a 57 y.o.male presenting for followup. I last saw him in April 2013. He is here with his wife, reports no angina symptoms or nitroglycerin use. States he has been trying to diet, has lost almost 20 pounds. Still not exercising regularly.  Last ischemic evaluation was via Myoview in May 2013. Study revealed scar and mild peri-infarct ischemia in the inferolateral wall, LVEF 52% with inferior hypokinesis. He has been managed medically.  Recent lab work from January showed hemoglobin 13.1, platelets 167, potassium 4.4, BUN 16, creatinine 0.8. 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   No Known Allergies  Current Outpatient Prescriptions  Medication Sig Dispense Refill  . amitriptyline (ELAVIL) 25 MG tablet Take 25 mg by mouth at bedtime.       Marland Kitchen aspirin 81 MG chewable tablet Chew 81 mg by mouth daily.        Marland Kitchen glyBURIDE (DIABETA) 5 MG tablet Take 10 mg by mouth 2 (two) times daily.        Marland Kitchen HYDROcodone-acetaminophen (NORCO) 7.5-325 MG per tablet Take 1 tablet by mouth every 6 (six) hours as needed.        Marland Kitchen ibuprofen (ADVIL,MOTRIN) 200 MG tablet Take 200 mg by mouth at bedtime as needed.      . insulin glargine (LANTUS) 100 UNIT/ML injection Inject into the skin at bedtime. Pt is adjusting his own doses and varies      . isosorbide mononitrate (IMDUR) 30 MG 24 hr tablet Take 1 tablet (30 mg total) by mouth daily.  30 tablet  12  . metFORMIN (GLUCOPHAGE) 500 MG tablet Take 1,000 mg by mouth 2 (two) times daily with a meal.        . metoprolol (LOPRESSOR) 50 MG tablet Take 25 mg by mouth 2 (two) times daily.        . naproxen (NAPROSYN) 500 MG tablet Take 500 mg by mouth 2 (two) times daily with a meal.       . nitroGLYCERIN (NITROSTAT) 0.4 MG SL tablet Place 1 tablet (0.4 mg total) under the tongue every 5 (five) minutes as needed.  25 tablet  6  . pioglitazone (ACTOS) 15 MG tablet Take 15 mg by mouth daily.        . ramipril (ALTACE) 5 MG  capsule Take 5 mg by mouth daily.        . simvastatin (ZOCOR) 10 MG tablet TAKE 1 TABLET BY MOUTH EVERY NIGHT AT BEDTIME  30 tablet  2  . spironolactone (ALDACTONE) 50 MG tablet TAKE 1 TABLET BY MOUTH EVERY DAY  30 tablet  1   No current facility-administered medications for this visit.    Past Medical History  Diagnosis Date  . Type 2 diabetes mellitus   . Coronary atherosclerosis of native coronary artery     BMS LAD and PTCA RCA 2000,BMS RCA/PLA 2001  . MI (myocardial infarction) 2001  . Hyperlipidemia   . GERD (gastroesophageal reflux disease)   . Low-grade NHL (non-Hodgkin's lymphoma)   . Lupus anticoagulant positive   . Gastric ulcer   . Diabetes mellitus     Social History Kelly [name] reports that he has quit smoking. His smoking use included Cigarettes. He smoked 0.00 packs per day. He has quit using smokeless tobacco. Kelly [name] reports that he does not drink alcohol.  Review of Systems No palpitations or syncope. No orthopnea or PND.  Physical Examination Filed Vitals:   07/21/12 1509  BP:  110/60  Pulse: 64   Filed Weights   07/21/12 1509  Weight: 309 lb (140.161 kg)    Morbidly obese male in NAD.  HEENT: Conjunctiva and lids normal, oropharynx clear.  Neck: Increased girth, no elevated JVP or carotid bruits, no thyromegaly.  Lungs: Clear to auscultation, nonlabored breathing at rest.  Cardiac: Regular rate and rhythm, no S3 or significant systolic murmur, no pericardial rub.  Abdomen: Soft, protuberant, bowel sounds present, no guarding or rebound.  Extremities: No pitting edema, distal pulses 2+.     Problem List and Plan   CORONARY ATHEROSCLEROSIS NATIVE CORONARY ARTERY Symptomatically stable. Continue medical therapy and observation. Encouraged diet with weight loss as well as regular exercise. Follow up arranged for 6 months.  HYPERTENSION Blood pressure is normal today.  HYPERLIPIDEMIA Continues on Zocor. Keep follow up with Dr. Juanetta Gosling, goal  LDL close to 70 if possible.    Jonelle Sidle, M.D., F.A.C.C.

## 2012-07-21 NOTE — Assessment & Plan Note (Signed)
Continues on Zocor. Keep follow up with Dr. Juanetta Gosling, goal LDL close to 70 if possible.

## 2012-07-21 NOTE — Patient Instructions (Addendum)
Your physician recommends that you schedule a follow-up appointment in: 6 MONTH   

## 2012-07-21 NOTE — Assessment & Plan Note (Signed)
Blood pressure is normal today. 

## 2012-07-21 NOTE — Assessment & Plan Note (Signed)
Symptomatically stable. Continue medical therapy and observation. Encouraged diet with weight loss as well as regular exercise. Follow up arranged for 6 months.

## 2012-07-28 ENCOUNTER — Telehealth (HOSPITAL_COMMUNITY): Payer: Self-pay

## 2012-07-28 ENCOUNTER — Other Ambulatory Visit (HOSPITAL_COMMUNITY): Payer: Self-pay | Admitting: Oncology

## 2012-07-28 DIAGNOSIS — F32A Depression, unspecified: Secondary | ICD-10-CM

## 2012-07-28 DIAGNOSIS — F329 Major depressive disorder, single episode, unspecified: Secondary | ICD-10-CM

## 2012-07-28 MED ORDER — FLUOXETINE HCL 20 MG PO CAPS: 20.0000 mg | ORAL_CAPSULE | Freq: Every day | ORAL | Status: DC

## 2012-07-28 NOTE — Telephone Encounter (Signed)
Patient to let us know if he is still taking the Fluoxetine or if it was discontinued.

## 2012-08-17 ENCOUNTER — Other Ambulatory Visit (HOSPITAL_COMMUNITY): Payer: Self-pay | Admitting: Oncology

## 2012-10-14 ENCOUNTER — Other Ambulatory Visit: Payer: Self-pay | Admitting: Cardiology

## 2012-10-14 ENCOUNTER — Other Ambulatory Visit (HOSPITAL_COMMUNITY): Payer: Self-pay | Admitting: Oncology

## 2012-11-15 ENCOUNTER — Other Ambulatory Visit: Payer: Self-pay | Admitting: Cardiology

## 2012-11-15 NOTE — Telephone Encounter (Signed)
Medication sent via escribe for isosorbide mononitrate (IMDUR) 30 MG 24 hr tablet.

## 2012-12-03 ENCOUNTER — Encounter (HOSPITAL_COMMUNITY): Payer: BC Managed Care – PPO | Attending: Hematology and Oncology

## 2012-12-03 VITALS — BP 123/60 | HR 70 | Temp 97.0°F | Resp 20 | Wt 314.4 lb

## 2012-12-03 DIAGNOSIS — C8295 Follicular lymphoma, unspecified, lymph nodes of inguinal region and lower limb: Secondary | ICD-10-CM | POA: Insufficient documentation

## 2012-12-03 DIAGNOSIS — C8590 Non-Hodgkin lymphoma, unspecified, unspecified site: Secondary | ICD-10-CM

## 2012-12-03 DIAGNOSIS — C8589 Other specified types of non-Hodgkin lymphoma, extranodal and solid organ sites: Secondary | ICD-10-CM

## 2012-12-03 LAB — CBC WITH DIFFERENTIAL/PLATELET
Basophils Relative: 0 % (ref 0–1)
Eosinophils Absolute: 0.2 10*3/uL (ref 0.0–0.7)
Eosinophils Relative: 3 % (ref 0–5)
HCT: 37.1 % — ABNORMAL LOW (ref 39.0–52.0)
Hemoglobin: 13.2 g/dL (ref 13.0–17.0)
MCH: 34.2 pg — ABNORMAL HIGH (ref 26.0–34.0)
MCHC: 35.6 g/dL (ref 30.0–36.0)
MCV: 96.1 fL (ref 78.0–100.0)
Monocytes Absolute: 0.5 10*3/uL (ref 0.1–1.0)
Monocytes Relative: 6 % (ref 3–12)

## 2012-12-03 LAB — COMPREHENSIVE METABOLIC PANEL
Albumin: 3.9 g/dL (ref 3.5–5.2)
BUN: 23 mg/dL (ref 6–23)
Chloride: 97 mEq/L (ref 96–112)
Creatinine, Ser: 1.07 mg/dL (ref 0.50–1.35)
Total Bilirubin: 0.5 mg/dL (ref 0.3–1.2)
Total Protein: 7.2 g/dL (ref 6.0–8.3)

## 2012-12-03 LAB — LACTATE DEHYDROGENASE: LDH: 162 U/L (ref 94–250)

## 2012-12-03 NOTE — Progress Notes (Signed)
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   INTERVAL HISTORY:   Kelly [name] 57 y.o. male returns to the clinic today for scheduled followup.  He was diagnosed with a follicular lymphoma involving the right inguinal lymph node in April of 2007.  Patient had excision biopsy at that time.  According to his record he has not received any further treatment and has not had need for any further treatment. He was noted on PET scan in 2007 to have abdominal metabolic lymph nodes involving the right axilla, retroperitoneum left, and area and external iliac chains. There was also reported involvement of  the left obturator  chain, and the left superficial inguinal chain. Maximum SUV increased range from 3.7  To  8.4. CT abdomen and 11/2010 did not show any abdominal  lymphadenopathy.  He tells me that he feels well and denies any new complaints, he denies fever , chills or night sweats. He has not noticed any lymphadenopathy of concern.  His appetite is good and he has not had any  weight loss.  MEDICAL HISTORY: Past Medical History  Diagnosis Date  . Type 2 diabetes mellitus   . Coronary atherosclerosis of native coronary artery     BMS LAD and PTCA RCA 2000,BMS RCA/PLA 2001  . MI (myocardial infarction) 2001  . Hyperlipidemia   . GERD (gastroesophageal reflux disease)   . Low-grade NHL (non-Hodgkin's lymphoma)   . Lupus anticoagulant positive   . Gastric ulcer   . Diabetes mellitus     ALLERGIES:  has No Known Allergies.  MEDICATIONS:  Current Outpatient Prescriptions  Medication Sig Dispense Refill  . aspirin 81 MG chewable tablet Chew 81 mg by mouth daily.        Marland Kitchen FLUoxetine (PROZAC) 20 MG capsule Take 1 capsule (20 mg total) by mouth daily.   30 capsule  3  . glyBURIDE (DIABETA) 5 MG tablet Take 10 mg by mouth 2 (two) times daily.        Marland Kitchen HYDROcodone-acetaminophen (NORCO) 7.5-325 MG per tablet Take 1 tablet by mouth every 6 (six) hours as needed.        Marland Kitchen ibuprofen (ADVIL,MOTRIN) 200 MG tablet Take 200 mg by mouth at bedtime as needed.      . insulin glargine (LANTUS) 100 UNIT/ML injection Inject into the skin at bedtime. Pt is adjusting his own doses and varies      . isosorbide mononitrate (IMDUR) 30 MG 24 hr tablet TAKE 1 TABLET BY MOUTH EVERY DAY  30 tablet  3  . losartan (COZAAR) 100 MG tablet Take 100 mg by mouth daily.      . metFORMIN (GLUCOPHAGE) 500 MG tablet Take 1,000 mg by mouth 2 (two) times daily with a meal.        . metoprolol (LOPRESSOR) 50 MG tablet Take 25 mg by mouth 2 (two) times daily.        . naproxen (NAPROSYN) 500 MG tablet Take 500 mg by mouth 2 (two) times daily with a meal.       . pioglitazone (ACTOS) 15 MG tablet Take 15 mg by mouth daily.        . simvastatin (ZOCOR) 10 MG tablet TAKE 1 TABLET BY MOUTH EVERY NIGHT AT BEDTIME  30 tablet  2  . spironolactone (ALDACTONE) 50 MG tablet TAKE 1 TABLET BY MOUTH EVERY DAY  30 tablet  2  . zolpidem (AMBIEN) 10 MG tablet Take 10 mg by mouth at bedtime as needed for sleep.      . nitroGLYCERIN (NITROSTAT) 0.4 MG SL tablet Place 1 tablet (0.4 mg total) under the tongue every 5 (five) minutes as needed.  25 tablet  6   No current facility-administered medications for this visit.    SURGICAL HISTORY:  Past Surgical History  Procedure Laterality Date  . Inguinal lymph node biopsy      right inguinal lymph node biopsy  . Back surgery      multiple  . Neck surgery    . Axillary abscess      left incision and drainage left  . Dental extractions       REVIEW OF SYSTEMS: 14 point review of system is as in the history above otherwise negative.  PHYSICAL EXAMINATION:  Blood pressure 123/60, pulse 70, temperature 97 F (36.1 C), temperature source Oral,  resp. rate 20, weight 314 lb 6.4 oz (142.611 kg). GENERAL: No distress. Obese. SKIN:  No rashes or significant lesions . HEAD: Normocephalic, No masses, lesions, tenderness or abnormalities  EYES: Conjunctiva are pink and non-injected . ENT: External ears normal ,lips, buccal mucosa, and tongue normal and mucous membranes are moist  LYMPH: No palpable lymphadenopathy, in the neck, supraclavicular, axillary or inguinal lymph node areas.  However sensitivity of palpation was limited by obesity. LUNGS: clear to auscultation , no crackles or wheezes HEART: regular rate & rhythm, no murmurs, no gallops, S1 normal and S2 normal  ABDOMEN: Abdomen soft, non-tender, normal bowel sounds, no masses or organomegaly and no hepatosplenomegaly palpable.  Ventral hernia noted. EXTREMITIES: 1+ pitting leg  edema, no skin discoloration or tenderness.      LABORATORY DATA: Lab Results  Component Value Date   WBC 7.5 06/04/2012   HGB 13.1 06/04/2012   HCT 36.9* 06/04/2012   MCV 94.9 06/04/2012   PLT 167 06/04/2012      Chemistry      Component Value Date/Time   NA 135 06/04/2012 1507   K 4.4 06/04/2012 1507   CL 103 06/04/2012 1507   CO2 23 06/04/2012 1507   BUN 16 06/04/2012 1507   CREATININE 0.84 06/04/2012 1507      Component Value Date/Time   CALCIUM 9.3 06/04/2012 1507   ALKPHOS 51 06/04/2012 1507   AST 28 06/04/2012 1507   ALT 27 06/04/2012 1507   BILITOT 0.4 06/04/2012 1507       RADIOGRAPHIC STUDIES: No results found.    ASSESSMENT: Patient has no clinical evidence of disease at this time lacks B. Symptoms. I don't feel that imaging is indicated at this time.  I think at this time this can be done as clinically indicated.    PLAN:  1. Return to clinic in 6 months for followup. 2. CBC/CMP today and repeat in 6 months.     All questions were satisfactorily answered. Patient knows to call if  any concern arises.  I spent more than 50 % counseling the patient face to face. The total  time spent in the appointment was 30 minutes.   Sherral Hammers, MD FACP. Hematology/Oncology.

## 2012-12-03 NOTE — Patient Instructions (Addendum)
[facility] Discharge Instructions  RECOMMENDATIONS MADE BY THE CONSULTANT AND ANY TEST RESULTS WILL BE SENT TO YOUR REFERRING PHYSICIAN.  EXAM FINDINGS BY THE PHYSICIAN TODAY AND SIGNS OR SYMPTOMS TO REPORT TO CLINIC OR PRIMARY PHYSICIAN: Exam and discussion by Dr. Sharia Reeve.  You are doing well.  MEDICATIONS PRESCRIBED:  none  INSTRUCTIONS GIVEN AND DISCUSSED: Report fevers, night sweats, unexplained weight loss or other problems.  SPECIAL INSTRUCTIONS/FOLLOW-UP: Follow-up in 6 months.  Thank you for choosing [name] to provide your oncology and hematology care.  To afford each patient quality time with our providers, please arrive at least 15 minutes before your scheduled appointment time.  With your help, our goal is to use those 15 minutes to complete the necessary work-up to ensure our physicians have the information they need to help with your evaluation and healthcare recommendations.    Effective January 1st, 2014, we ask that you re-schedule your appointment with our physicians should you arrive 10 or more minutes late for your appointment.  We strive to give you quality time with our providers, and arriving late affects you and other patients whose appointments are after yours.    Again, thank you for choosing [facility].  Our hope is that these requests will decrease the amount of time that you wait before being seen by our physicians.       _____________________________________________________________  Should you have questions after your visit to [facility], please contact our office at [phone] between the hours of 8:30 a.m. and 5:00 p.m.  Voicemails left after 4:30 p.m. will not be returned until the following business day.  For prescription refill requests, have your pharmacy contact our office with your prescription refill request.

## 2013-01-13 ENCOUNTER — Other Ambulatory Visit (HOSPITAL_COMMUNITY): Payer: Self-pay | Admitting: Oncology

## 2013-01-13 DIAGNOSIS — R609 Edema, unspecified: Secondary | ICD-10-CM

## 2013-01-13 MED ORDER — SPIRONOLACTONE 50 MG PO TABS: 50.0000 mg | ORAL_TABLET | Freq: Every day | ORAL | Status: DC

## 2013-03-19 ENCOUNTER — Other Ambulatory Visit: Payer: Self-pay | Admitting: Cardiology

## 2013-03-24 ENCOUNTER — Other Ambulatory Visit: Payer: Self-pay

## 2013-04-18 ENCOUNTER — Other Ambulatory Visit (HOSPITAL_COMMUNITY): Payer: Self-pay | Admitting: Oncology

## 2013-04-18 DIAGNOSIS — R609 Edema, unspecified: Secondary | ICD-10-CM

## 2013-04-18 MED ORDER — SPIRONOLACTONE 50 MG PO TABS: 50.0000 mg | ORAL_TABLET | Freq: Every day | ORAL | Status: DC

## 2013-06-03 ENCOUNTER — Other Ambulatory Visit (HOSPITAL_COMMUNITY): Payer: Self-pay

## 2013-06-03 ENCOUNTER — Ambulatory Visit (HOSPITAL_COMMUNITY): Payer: Self-pay

## 2013-06-20 DIAGNOSIS — E66813 Obesity, class 3: Secondary | ICD-10-CM | POA: Insufficient documentation

## 2013-07-16 ENCOUNTER — Other Ambulatory Visit: Payer: Self-pay | Admitting: Cardiology

## 2013-07-18 ENCOUNTER — Other Ambulatory Visit (HOSPITAL_COMMUNITY): Payer: Self-pay | Admitting: Oncology

## 2013-07-18 DIAGNOSIS — R609 Edema, unspecified: Secondary | ICD-10-CM

## 2013-07-18 MED ORDER — SPIRONOLACTONE 50 MG PO TABS: 50.0000 mg | ORAL_TABLET | Freq: Every day | ORAL | Status: DC

## 2013-08-19 ENCOUNTER — Other Ambulatory Visit: Payer: Self-pay | Admitting: Cardiology

## 2013-08-19 MED ORDER — ISOSORBIDE MONONITRATE ER 30 MG PO TB24: ORAL_TABLET | ORAL | Status: DC

## 2013-10-18 ENCOUNTER — Other Ambulatory Visit (HOSPITAL_COMMUNITY): Payer: Self-pay | Admitting: Oncology

## 2014-02-05 ENCOUNTER — Other Ambulatory Visit (HOSPITAL_COMMUNITY): Payer: Self-pay | Admitting: Hematology and Oncology

## 2014-02-06 ENCOUNTER — Other Ambulatory Visit (HOSPITAL_COMMUNITY): Payer: Self-pay | Admitting: Hematology and Oncology

## 2014-02-06 DIAGNOSIS — R609 Edema, unspecified: Secondary | ICD-10-CM

## 2014-02-06 MED ORDER — SPIRONOLACTONE 50 MG PO TABS: 50.0000 mg | ORAL_TABLET | Freq: Every day | ORAL | Status: DC

## 2014-03-03 ENCOUNTER — Other Ambulatory Visit: Payer: Self-pay

## 2014-03-03 ENCOUNTER — Other Ambulatory Visit (HOSPITAL_COMMUNITY): Payer: Self-pay | Admitting: Pulmonary Disease

## 2014-03-03 DIAGNOSIS — R1084 Generalized abdominal pain: Secondary | ICD-10-CM

## 2014-03-08 ENCOUNTER — Other Ambulatory Visit (HOSPITAL_COMMUNITY): Payer: Self-pay | Admitting: Pulmonary Disease

## 2014-03-08 ENCOUNTER — Ambulatory Visit (HOSPITAL_COMMUNITY)
Admission: RE | Admit: 2014-03-08 | Discharge: 2014-03-08 | Disposition: A | Discharge status: Home / Self Care | Payer: BC Managed Care – PPO | Source: Ambulatory Visit | Attending: Pulmonary Disease | Admitting: Pulmonary Disease

## 2014-03-08 ENCOUNTER — Other Ambulatory Visit: Payer: Self-pay | Admitting: Cardiology

## 2014-03-08 DIAGNOSIS — M7989 Other specified soft tissue disorders: Secondary | ICD-10-CM | POA: Insufficient documentation

## 2014-03-08 DIAGNOSIS — E785 Hyperlipidemia, unspecified: Secondary | ICD-10-CM | POA: Diagnosis not present

## 2014-03-08 DIAGNOSIS — R1084 Generalized abdominal pain: Secondary | ICD-10-CM | POA: Insufficient documentation

## 2014-03-08 DIAGNOSIS — I2510 Atherosclerotic heart disease of native coronary artery without angina pectoris: Secondary | ICD-10-CM | POA: Diagnosis not present

## 2014-03-08 DIAGNOSIS — M25562 Pain in left knee: Secondary | ICD-10-CM

## 2014-03-08 DIAGNOSIS — I10 Essential (primary) hypertension: Secondary | ICD-10-CM | POA: Diagnosis not present

## 2014-03-08 DIAGNOSIS — E119 Type 2 diabetes mellitus without complications: Secondary | ICD-10-CM | POA: Insufficient documentation

## 2014-03-17 ENCOUNTER — Other Ambulatory Visit (HOSPITAL_COMMUNITY): Payer: Self-pay | Admitting: Pulmonary Disease

## 2014-03-17 DIAGNOSIS — R112 Nausea with vomiting, unspecified: Secondary | ICD-10-CM

## 2014-03-17 DIAGNOSIS — R109 Unspecified abdominal pain: Secondary | ICD-10-CM

## 2014-03-22 ENCOUNTER — Encounter (HOSPITAL_COMMUNITY): Payer: Self-pay

## 2014-03-22 ENCOUNTER — Encounter (HOSPITAL_COMMUNITY)
Admission: RE | Admit: 2014-03-22 | Discharge: 2014-03-22 | Disposition: A | Discharge status: Home / Self Care | Payer: BC Managed Care – PPO | Source: Ambulatory Visit | Attending: Diagnostic Radiology | Admitting: Diagnostic Radiology

## 2014-03-22 DIAGNOSIS — R112 Nausea with vomiting, unspecified: Secondary | ICD-10-CM | POA: Insufficient documentation

## 2014-03-22 DIAGNOSIS — R109 Unspecified abdominal pain: Secondary | ICD-10-CM | POA: Diagnosis present

## 2014-03-22 MED ORDER — TECHNETIUM TC 99M MEBROFENIN IV KIT
5.0000 | PACK | Freq: Once | INTRAVENOUS | Status: AC | PRN
  Administered 2014-03-22: 5 via INTRAVENOUS

## 2014-03-22 MED ORDER — STERILE WATER FOR INJECTION IJ SOLN
INTRAMUSCULAR | Status: AC
  Administered 2014-03-22: 2.95 mL via INTRAVENOUS
  Filled 2014-03-22: qty 10

## 2014-03-22 MED ORDER — SINCALIDE 5 MCG IJ SOLR
INTRAMUSCULAR | Status: AC
  Administered 2014-03-22: 2.95 ug via INTRAVENOUS
  Filled 2014-03-22: qty 5

## 2014-03-22 MED ORDER — SODIUM CHLORIDE 0.9 % IJ SOLN
INTRAMUSCULAR | Status: AC
  Filled 2014-03-22: qty 12

## 2014-03-30 ENCOUNTER — Encounter: Payer: Self-pay | Admitting: Gastroenterology

## 2014-04-04 ENCOUNTER — Other Ambulatory Visit: Payer: Self-pay | Admitting: Cardiology

## 2014-05-04 ENCOUNTER — Other Ambulatory Visit (HOSPITAL_COMMUNITY): Payer: Self-pay | Admitting: Hematology and Oncology

## 2014-05-04 DIAGNOSIS — R609 Edema, unspecified: Secondary | ICD-10-CM

## 2014-05-04 MED ORDER — SPIRONOLACTONE 50 MG PO TABS: 50.0000 mg | ORAL_TABLET | Freq: Every day | ORAL | Status: DC

## 2014-05-05 ENCOUNTER — Ambulatory Visit (INDEPENDENT_AMBULATORY_CARE_PROVIDER_SITE_OTHER): Payer: BC Managed Care – PPO | Admitting: Gastroenterology

## 2014-05-05 ENCOUNTER — Encounter: Payer: Self-pay | Admitting: Cardiology

## 2014-05-05 ENCOUNTER — Encounter: Payer: Self-pay | Admitting: Gastroenterology

## 2014-05-05 ENCOUNTER — Ambulatory Visit (INDEPENDENT_AMBULATORY_CARE_PROVIDER_SITE_OTHER): Payer: BC Managed Care – PPO | Admitting: Cardiology

## 2014-05-05 VITALS — BP 118/74 | HR 78 | Ht 76.0 in | Wt 304.0 lb

## 2014-05-05 VITALS — BP 123/66 | HR 69 | Temp 97.3°F | Ht 75.0 in | Wt 340.6 lb

## 2014-05-05 DIAGNOSIS — I10 Essential (primary) hypertension: Secondary | ICD-10-CM

## 2014-05-05 DIAGNOSIS — K760 Fatty (change of) liver, not elsewhere classified: Secondary | ICD-10-CM

## 2014-05-05 DIAGNOSIS — E782 Mixed hyperlipidemia: Secondary | ICD-10-CM

## 2014-05-05 DIAGNOSIS — R1013 Epigastric pain: Secondary | ICD-10-CM

## 2014-05-05 DIAGNOSIS — I2510 Atherosclerotic heart disease of native coronary artery without angina pectoris: Secondary | ICD-10-CM

## 2014-05-05 MED ORDER — NITROGLYCERIN 0.4 MG SL SUBL: 0.4000 mg | SUBLINGUAL_TABLET | SUBLINGUAL | Status: DC | PRN

## 2014-05-05 MED ORDER — MELOXICAM 7.5 MG PO TABS: 7.5000 mg | ORAL_TABLET | Freq: Every day | ORAL | Status: DC

## 2014-05-05 NOTE — Patient Instructions (Addendum)
1. Continue Prilosec indefinitely. 2. Trial of Modic 7.5 mg daily for arthritic pain. If you develop recurrent abdominal pain, stop medication immediately and notify our office. 3. I will touch base with the Dr. Oneida Alar here and determine whether you need to have an upper endoscopy or upper GI series (x-ray) of your stomach. Given you no longer having any symptoms, you may not need to have any further workup as long as you continue to do well. 4. I will also request previous records for review.

## 2014-05-05 NOTE — Progress Notes (Signed)
Primary Care Physician:  Alonza Bogus, MD  Primary Gastroenterologist:  Barney Drain, MD   Chief Complaint  Patient presents with  . Abdominal Pain    HPI:  Kelly [name] is a 58 y.o. male here for further evaluation of postprandial epigastric pain which started back in 02/2014. Patient states he was on Naprosyn for his osteoarthritis, especially of knees. Work up of abdominal pain has included Abd u/s and HIDA as noted. No N/V. Patient says she has h/o PUD. Reports prior EGD by Dr. Watt Climes. States he was seen here remotely but no procedures done here locally. Those records are not available at this time. Patient says he recently stopped naprosyn and started Prilosec 20mg  daily and now abdominal pain has resolved. He has a ventral hernia, doesn't bother him. He denies constipation, diarrhea, melena, brbpr. He has h/o non-Hodgkins, diagnosed in 2007, has not required treatment.    02/2014: abd u/s: fatty liver 03/2014: HIDA: normal GB EF 56%.  Current Outpatient Prescriptions  Medication Sig Dispense Refill  . aspirin 81 MG chewable tablet Chew 81 mg by mouth daily.      Marland Kitchen FLUoxetine (PROZAC) 20 MG capsule Take 1 capsule (20 mg total) by mouth daily. 30 capsule 3  . glyBURIDE (DIABETA) 5 MG tablet Take 10 mg by mouth 2 (two) times daily.      . insulin glargine (LANTUS) 100 UNIT/ML injection Inject 65 Units into the skin 2 (two) times daily. Pt is adjusting his own doses and varies    . isosorbide mononitrate (IMDUR) 30 MG 24 hr tablet TAKE 1 TABLET BY MOUTH EVERY DAY 30 tablet 6  . losartan (COZAAR) 100 MG tablet Take 100 mg by mouth daily.    . metFORMIN (GLUCOPHAGE) 500 MG tablet Take 1,000 mg by mouth 2 (two) times daily with a meal.      . metoprolol (LOPRESSOR) 50 MG tablet Take 25 mg by mouth 2 (two) times daily.      . nitroGLYCERIN (NITROSTAT) 0.4 MG SL tablet Place 1 tablet (0.4 mg total) under the tongue every 5 (five) minutes as needed. 25 tablet 6  . omeprazole (PRILOSEC)  20 MG capsule Take 20 mg by mouth daily.    Marland Kitchen oxyCODONE-acetaminophen (PERCOCET) 10-325 MG per tablet Take 1 tablet by mouth every 4 (four) hours as needed for pain.    . pioglitazone (ACTOS) 15 MG tablet Take 15 mg by mouth daily.      . simvastatin (ZOCOR) 10 MG tablet TAKE 1 TABLET BY MOUTH EVERY NIGHT AT BEDTIME 30 tablet 2  . spironolactone (ALDACTONE) 50 MG tablet Take 1 tablet (50 mg total) by mouth daily. 30 tablet 2  . zolpidem (AMBIEN) 10 MG tablet Take 10 mg by mouth at bedtime as needed for sleep.    .       . ibuprofen (ADVIL,MOTRIN) 200 MG tablet Take 200 mg by mouth at bedtime as needed.    .        No current facility-administered medications for this visit.    Allergies as of 05/05/2014  . (No Known Allergies)    Past Medical History  Diagnosis Date  . Type 2 diabetes mellitus   . Coronary atherosclerosis of native coronary artery     BMS LAD and PTCA RCA 2000,BMS RCA/PLA 2001  . MI (myocardial infarction) 2001  . Hyperlipidemia   . GERD (gastroesophageal reflux disease)   . Low-grade NHL (non-Hodgkin's lymphoma)   . Lupus anticoagulant positive   . Gastric  ulcer   . Diabetes mellitus   . HTN (hypertension)     Past Surgical History  Procedure Laterality Date  . Inguinal lymph node biopsy      right inguinal lymph node biopsy  . Back surgery      multiple  . Neck surgery    . Axillary abscess      left incision and drainage left  . Dental extractions    . Colonoscopy      about 3-4 yrs ago  . Esophagogastroduodenoscopy      about 3-4 yrs ago  . Shoulder arthroscopy    . Cardiac stents      Family History  Problem Relation Age of Onset  . Pancreatic cancer Maternal Grandmother   . Colon cancer Neg Hx     History   Social History  . Marital Status: Married    Spouse Name: N/A    Number of Children: N/A  . Years of Education: N/A   Occupational History  . Not on file.   Social History Main Topics  . Smoking status: Former Smoker --  1.00 packs/day for 23 years    Types: Cigarettes    Start date: 10/30/1970    Quit date: 10/29/1993  . Smokeless tobacco: Former Systems developer    Types: Snuff, Chew  . Alcohol Use: No  . Drug Use: No  . Sexual Activity: Not on file   Other Topics Concern  . Not on file   Social History Narrative      ROS:  General: Negative for anorexia, weight loss, fever, chills, fatigue, weakness. Eyes: Negative for vision changes.  ENT: Negative for hoarseness, difficulty swallowing , nasal congestion. CV: Negative for chest pain, angina, palpitations, dyspnea on exertion, peripheral edema.  Respiratory: Negative for dyspnea at rest, dyspnea on exertion, cough, sputum, wheezing.  GI: See history of present illness. GU:  Negative for dysuria, hematuria, urinary incontinence, urinary frequency, nocturnal urination.  MS: + for knee and right shoulder pain. Negative for  low back pain.  Derm: Negative for rash or itching.  Neuro: Negative for weakness, abnormal sensation, seizure, frequent headaches, memory loss, confusion.  Psych: Negative for anxiety, depression, suicidal ideation, hallucinations.  Endo: Negative for unusual weight change.  Heme: Negative for bruising or bleeding. Allergy: Negative for rash or hives.    Physical Examination:  BP 123/66 mmHg  Pulse 69  Temp(Src) 97.3 F (36.3 C) (Oral)  Ht 6\' 3"  (1.905 m)  Wt 340 lb 9.6 oz (154.495 kg)  BMI 42.57 kg/m2   General: Well-nourished, well-developed in no acute distress.  Head: Normocephalic, atraumatic.   Eyes: Conjunctiva pink, no icterus. Mouth: Oropharyngeal mucosa moist and pink , no lesions erythema or exudate. Neck: Supple without thyromegaly, masses, or lymphadenopathy.  Lungs: Clear to auscultation bilaterally.  Heart: Regular rate and rhythm, no murmurs rubs or gallops.  Abdomen: Bowel sounds are normal, nontender, nondistended, no hepatosplenomegaly or masses, no abdominal bruits or    hernia , no rebound or guarding.    Rectal: not performed Extremities: No lower extremity edema. No clubbing or deformities.  Neuro: Alert and oriented x 4 , grossly normal neurologically.  Skin: Warm and dry, no rash or jaundice.   Psych: Alert and cooperative, normal mood and affect.  Labs: 03/08/14: Creatinine 1.08, Tbili 0.6, AP 48, AST 22, ALT 21 alb 4.1, HgbA1C 8.3, Hpylori IgG 0.46,

## 2014-05-05 NOTE — Assessment & Plan Note (Signed)
Continues on Zocor, followed by Dr. Luan Pulling.

## 2014-05-05 NOTE — Progress Notes (Signed)
Reason for visit: CAD  Clinical Summary Kelly [name] is a 58 y.o.male last seen in March 2014. He is here with his wife for a routine follow-up visit. He reports no significant angina symptoms. Has an old bottle of nitroglycerin that he lost, and now needs a refill. He otherwise continues on his regular medications including aspirin, Imdur, Cozaar, Lopressor, Aldactone, and Zocor. Lipids have been followed by Dr. Luan Pulling. He does not exercise regularly, continues to work full time. ECG today shows sinus rhythm with prolonged PR interval, left anterior fascicular block, right bundle branch block, and increased voltage.  Last ischemic evaluation was via Myoview in May 2013. Study revealed scar and mild peri-infarct ischemia in the inferolateral wall, LVEF 52% with inferior hypokinesis. We continue to follow him on medical therapy with no progressive symptoms.  No Known Allergies  Current Outpatient Prescriptions  Medication Sig Dispense Refill  . aspirin 81 MG chewable tablet Chew 81 mg by mouth daily.      Marland Kitchen FLUoxetine (PROZAC) 20 MG capsule Take 1 capsule (20 mg total) by mouth daily. 30 capsule 3  . glyBURIDE (DIABETA) 5 MG tablet Take 10 mg by mouth 2 (two) times daily.      . insulin glargine (LANTUS) 100 UNIT/ML injection Inject 65 Units into the skin 2 (two) times daily. Pt is adjusting his own doses and varies    . isosorbide mononitrate (IMDUR) 30 MG 24 hr tablet TAKE 1 TABLET BY MOUTH EVERY DAY 30 tablet 6  . losartan (COZAAR) 100 MG tablet Take 100 mg by mouth daily.    . meloxicam (MOBIC) 7.5 MG tablet Take 1 tablet (7.5 mg total) by mouth daily. 30 tablet 0  . metFORMIN (GLUCOPHAGE) 500 MG tablet Take 1,000 mg by mouth 2 (two) times daily with a meal.      . metoprolol (LOPRESSOR) 50 MG tablet Take 25 mg by mouth 2 (two) times daily.      . nitroGLYCERIN (NITROSTAT) 0.4 MG SL tablet Place 1 tablet (0.4 mg total) under the tongue every 5 (five) minutes as needed. 25 tablet 6  .  omeprazole (PRILOSEC) 20 MG capsule Take 20 mg by mouth daily.    Marland Kitchen oxyCODONE-acetaminophen (PERCOCET) 10-325 MG per tablet Take 1 tablet by mouth every 4 (four) hours as needed for pain.    . pioglitazone (ACTOS) 15 MG tablet Take 15 mg by mouth daily.      . simvastatin (ZOCOR) 10 MG tablet TAKE 1 TABLET BY MOUTH EVERY NIGHT AT BEDTIME 30 tablet 2  . spironolactone (ALDACTONE) 50 MG tablet Take 1 tablet (50 mg total) by mouth daily. 30 tablet 2  . zolpidem (AMBIEN) 10 MG tablet Take 10 mg by mouth at bedtime as needed for sleep.     No current facility-administered medications for this visit.    Past Medical History  Diagnosis Date  . Type 2 diabetes mellitus   . Coronary atherosclerosis of native coronary artery     BMS LAD and PTCA RCA 2000,BMS RCA/PLA 2001  . MI (myocardial infarction) 2001  . Hyperlipidemia   . GERD (gastroesophageal reflux disease)   . Low-grade NHL (non-Hodgkin's lymphoma)   . Lupus anticoagulant positive   . Gastric ulcer   . Diabetes mellitus     Social History Kelly [name] reports that he quit smoking about 20 years ago. His smoking use included Cigarettes. He started smoking about 43 years ago. He has a 23 pack-year smoking history. He has quit using smokeless tobacco.  His smokeless tobacco use included Snuff and Chew. Kelly [name] reports that he does not drink alcohol.  Review of Systems Complete review of systems negative except as otherwise outlined in the clinical summary and also the following. No claudication. Arthritic pains. No orthopnea or PND.  Physical Examination Filed Vitals:   05/05/14 1409  BP: 118/74  Pulse: 78   Filed Weights   05/05/14 1409  Weight: 304 lb (137.893 kg)    Morbidly obese male in NAD.  HEENT: Conjunctiva and lids normal, oropharynx clear.  Neck: Increased girth, no elevated JVP or carotid bruits, no thyromegaly.  Lungs: Clear to auscultation, nonlabored breathing at rest.  Cardiac: Regular rate and rhythm, no  S3 or significant systolic murmur, no pericardial rub.  Abdomen: Soft, protuberant, bowel sounds present, no guarding or rebound.  Extremities: No pitting edema, distal pulses 2+.    Problem List and Plan   CORONARY ATHEROSCLEROSIS NATIVE CORONARY ARTERY Symptomatically stable on medical therapy. Refill provided for nitroglycerin. Otherwise no changes. Follow-up in one year, sooner if needed.  Essential hypertension Blood pressure is well controlled today.  Mixed hyperlipidemia Continues on Zocor, followed by Dr. Luan Pulling.    Satira Sark, M.D., F.A.C.C.

## 2014-05-05 NOTE — Assessment & Plan Note (Signed)
Blood pressure is well-controlled today. 

## 2014-05-05 NOTE — Assessment & Plan Note (Signed)
Symptomatically stable on medical therapy. Refill provided for nitroglycerin. Otherwise no changes. Follow-up in one year, sooner if needed.

## 2014-05-05 NOTE — Patient Instructions (Addendum)
Your physician wants you to follow-up in: 1 year.  You will receive a reminder letter in the mail two months in advance. If you don't receive a letter, please call our office to schedule the follow-up appointment.  We have refilled your [facility]  Your physician recommends that you continue on your current medications as directed. Please refer to the Current Medication list given to you today.  Thank you for choosing [facility]!

## 2014-05-07 ENCOUNTER — Encounter: Payer: Self-pay | Admitting: Gastroenterology

## 2014-05-07 DIAGNOSIS — K7460 Unspecified cirrhosis of liver: Secondary | ICD-10-CM | POA: Insufficient documentation

## 2014-05-07 NOTE — Assessment & Plan Note (Signed)
Encouraged weight loss and tight glycemic control. LFTs currently normal.   Instructions for fatty liver: Recommend 1-2# weight loss per week until ideal body weight through exercise & diet. Low fat/cholesterol diet.   Avoid sweets, sodas, fruit juices, sweetened beverages like tea, etc. Gradually increase exercise from 15 min daily up to 1 hr per day 5 days/week. Limit alcohol use.

## 2014-05-07 NOTE — Assessment & Plan Note (Signed)
58 y/o male with h/o PUD remotely who presents for further evaluation of epigastric pain. GB work up was negative. Suspect gastritis versus PUD given his symptoms resolved after stopping naprosyn and prilosec. Favor gastritis since symptoms resolved quickly. To discuss with Dr. Oneida Alar, with regards to possible EGD vs UGI series given patient's history. I have requested copy of remote EGD for review. Patient will continue prilosec 20mg  daily. He is interested in trying another NSAID due to his knee pain now off Naprosyn. Wants to limit Percocet use. Trial of low dose Mobic. If symptoms recurrent, he will stop medication. Further recommendations to follow.

## 2014-05-08 NOTE — Addendum Note (Signed)
Addended by: Levonne Hubert on: 05/08/2014 01:21 PM   Modules accepted: Orders

## 2014-05-17 NOTE — Progress Notes (Signed)
cc'ed to pcp °

## 2014-05-30 ENCOUNTER — Encounter: Payer: Self-pay | Admitting: Gastroenterology

## 2014-06-01 ENCOUNTER — Other Ambulatory Visit: Payer: Self-pay | Admitting: Gastroenterology

## 2014-06-02 NOTE — Telephone Encounter (Signed)
Based on Leslie's previous note, was going to trial low dose Mobic and discontinue it if his knee pain symptoms were not improved on it. Please ask him if the Mobic has helped his knee pain. Thanks

## 2014-06-05 NOTE — Telephone Encounter (Signed)
Pt said the mobic has helped a lot. Said his knee feels better than it has for a long time.  He took his last tablet yesterday.

## 2014-06-07 NOTE — Telephone Encounter (Signed)
Noted. Please tell him I sent in the refill. Thanks.

## 2014-06-07 NOTE — Telephone Encounter (Signed)
LMOM that Rx has been sent in.  

## 2014-06-08 ENCOUNTER — Other Ambulatory Visit: Payer: Self-pay

## 2014-06-08 MED ORDER — MELOXICAM 7.5 MG PO TABS: 7.5000 mg | ORAL_TABLET | Freq: Every day | ORAL | Status: DC

## 2014-06-14 DIAGNOSIS — F32A Depression, unspecified: Secondary | ICD-10-CM | POA: Insufficient documentation

## 2014-06-15 ENCOUNTER — Encounter: Payer: Self-pay | Admitting: Gastroenterology

## 2014-06-15 NOTE — Progress Notes (Signed)
Reviewed records from Dr. Earlean Shawl. Patient had gastric ulcers (benign pathology, no H pylori) back in June 2008, at the time was on diclofenac. Colonoscopy at that same time revealed diverticulosis. He had a follow-up EGD in August 2008 which showed complete healing of gastric ulcers.  Patient seen back in December, abdominal pain which resolved after stopping Naprosyn.  Please let patient know that he should continue his Prilosec 20 mg daily. No indication for upper endoscopy at this time unless he has recurrent symptoms.  Would advise follow-up office visit with Dr. Oneida Alar in March 2016.  Patient needs colonoscopy in 2018, please NIC.

## 2014-06-16 NOTE — Progress Notes (Signed)
LMOM to call.

## 2014-06-21 NOTE — Progress Notes (Signed)
LMOM to call.

## 2014-06-21 NOTE — Progress Notes (Signed)
Pt is aware and he wants me to send the recommendations to him in the mail. Letter sent.

## 2014-06-22 ENCOUNTER — Encounter: Payer: Self-pay | Admitting: Gastroenterology

## 2014-06-22 NOTE — Progress Notes (Signed)
OV made for 3/9 at 0830 with SF and appt card mailed. Reminder in epic to have tcs in 2018

## 2014-07-26 ENCOUNTER — Ambulatory Visit (INDEPENDENT_AMBULATORY_CARE_PROVIDER_SITE_OTHER): Payer: BC Managed Care – PPO | Admitting: Gastroenterology

## 2014-07-26 ENCOUNTER — Other Ambulatory Visit: Payer: Self-pay

## 2014-07-26 ENCOUNTER — Encounter: Payer: Self-pay | Admitting: Gastroenterology

## 2014-07-26 VITALS — BP 140/69 | HR 70 | Temp 97.0°F | Ht 75.0 in | Wt 341.0 lb

## 2014-07-26 DIAGNOSIS — R1013 Epigastric pain: Secondary | ICD-10-CM

## 2014-07-26 MED ORDER — PANTOPRAZOLE SODIUM 40 MG PO TBEC: 40.0000 mg | DELAYED_RELEASE_TABLET | Freq: Every morning | ORAL | Status: DC

## 2014-07-26 NOTE — Progress Notes (Signed)
Subjective:    Patient [name], male    DOB: 07-Jan-1956, 59 y.o.   MRN: [id number]  Alonza Bogus, MD  HPI Has long standing history of epigastric/UPPER ABDOMINAL pain. Changed toomeprazole and Sx resolved. NO WEIGHT LOSS. BMs: 1-3X/DAY. NO SLEEP APNEA AND NEVER BEEN DIFFICULT TO SEDATE.  PT DENIES FEVER, CHILLS, HEMATOCHEZIA, nausea, vomiting, melena, diarrhea, CHEST PAIN, SHORTNESS OF BREATH,  CHANGE IN BOWEL IN HABITS, constipation, problems swallowing, problems with sedation, OR heartburn or indigestion.   Past Medical History  Diagnosis Date  . Type 2 diabetes mellitus   . Coronary atherosclerosis of native coronary artery     BMS LAD and PTCA RCA 2000,BMS RCA/PLA 2001  . MI (myocardial infarction) 2001  . Hyperlipidemia   . GERD (gastroesophageal reflux disease)   . Low-grade NHL (non-Hodgkin's lymphoma)   . Lupus anticoagulant positive   . Gastric ulcer   . Diabetes mellitus   . HTN (hypertension)     Past Surgical History  Procedure Laterality Date  . Inguinal lymph node biopsy      right inguinal lymph node biopsy  . Back surgery      multiple  . Neck surgery    . Axillary abscess      left incision and drainage left  . Dental extractions    . Colonoscopy  June 2008    Dr. Dellis Filbert Medoff: Mild left colonic diverticulosis  . Esophagogastroduodenoscopy  June 2008    Dr. Dellis Filbert Medoff: Gastric ulcer, antral, biopsy with reactive changes associated glandular atrophy, no H pylori. No features of lymphoma. Likely NSAID related  . Shoulder arthroscopy    . Cardiac stents    . Esophagogastroduodenoscopy  August 2008    Dr. Richmond Campbell: Complete healing of gastric ulcers.   No Known Allergies  Current Outpatient Prescriptions  Medication Sig Dispense Refill  . aspirin 81 MG chewable tablet Chew 81 mg by mouth daily.      Marland Kitchen FLUoxetine (PROZAC) 20 MG capsule Take 1 capsule (20 mg total) by mouth daily. (Patient taking differently: Take 20 mg by  mouth 2 (two) times daily. ) 30 capsule 3  . glyBURIDE (DIABETA) 5 MG tablet Take 10 mg by mouth 2 (two) times daily.      . insulin glargine (LANTUS) 100 UNIT/ML injection Inject 65 Units into the skin 2 (two) times daily. Pt is adjusting his own doses and varies    . isosorbide mononitrate (IMDUR) 30 MG 24 hr tablet TAKE 1 TABLET BY MOUTH EVERY DAY    . losartan (COZAAR) 100 MG tablet Take 100 mg by mouth daily.    . meloxicam (MOBIC) 7.5 MG tablet Take 1 tablet (7.5 mg total) by mouth daily.    . metFORMIN (GLUCOPHAGE) 500 MG tablet Take 1,000 mg by mouth 2 (two) times daily with a meal.      . metoprolol (LOPRESSOR) 50 MG tablet Take 25 mg by mouth 2 (two) times daily.      . nitroGLYCERIN (NITROSTAT) 0.4 MG SL tablet Place 1 tablet (0.4 mg total) under the tongue every 5 (five) minutes as needed.    Marland Kitchen omeprazole (PRILOSEC) 20 MG capsule Take 20 mg by mouth daily. otc   . oxyCODONE-acetaminophen (PERCOCET) 10-325 MG per tablet Take 1 tablet by mouth every 4 (four) hours as needed for pain.    . pioglitazone (ACTOS) 15 MG tablet Take 15 mg by mouth daily.      . simvastatin (ZOCOR) 10 MG tablet TAKE  1 TABLET BY MOUTH EVERY NIGHT AT BEDTIME    . zolpidem (AMBIEN) 10 MG tablet Take 10 mg by mouth at bedtime as needed for sleep.    .      . spironolactone (ALDACTONE) 50 MG tablet Take 1 tablet (50 mg total) by mouth daily.     Family History  Problem Relation Age of Onset  . Pancreatic cancer Maternal Grandmother   . Colon cancer Neg Hx     History  Substance Use Topics  . Smoking status: Former Smoker -- 1.00 packs/day for 23 years    Types: Cigarettes    Start date: 10/30/1970    Quit date: 10/29/1993  . Smokeless tobacco: Former Systems developer    Types: Snuff, Chew  . Alcohol Use: No   Review of Systems PER HPI OTHERWISE ALL SYSTEMS ARE NEGATIVE.    Objective:   Physical Exam  Constitutional: He is oriented to person, place, and time. He appears well-developed and well-nourished. No  distress.  HENT:  Head: Normocephalic and atraumatic.  Mouth/Throat: Oropharynx is clear and moist. No oropharyngeal exudate.  Eyes: Pupils are equal, round, and reactive to light. No scleral icterus.  Neck: Normal range of motion. Neck supple.  Cardiovascular: Normal rate, regular rhythm and normal heart sounds.   Pulmonary/Chest: Effort normal and breath sounds normal. No respiratory distress.  Abdominal: Soft. Bowel sounds are normal. He exhibits no distension. There is no tenderness.  Musculoskeletal: He exhibits no edema.  Lymphadenopathy:    He has no cervical adenopathy.  Neurological: He is alert and oriented to person, place, and time.  NO FOCAL DEFICITS    Psychiatric:  SLIGHTLY ANXIOUS MOOD, NL AFFECT   Vitals reviewed.         Assessment & Plan:

## 2014-07-26 NOTE — Patient Instructions (Signed)
STOP OMEPRAZOLE. CHANGE TO PROTONIX. TAKE 30 MINUTES PRIOR TO BREAKFAST.  UPPER ENDOSCOPY MAR 29- FOR DYSPEPSIA/BARRETT' SCREENING. DISCUSSED BARRETT'S, PROCEDURE, BENEFITS, AND RISKS OF PROCEDURE: < 1% chance of medication reaction, OR bleeding.  HALF LANTUS DOSE ON NIGHT PRIOR TO UPPER ENDOSCOPY. HOLD GLIPIZIDE & ACTOSE THE MORNING OF PROCEDURE.  CONTINUE GLUCOPHAGE.  CONTINUE YOUR WEIGHT LOSS EFFORTS.  FOLLOW UP IN 4 MOS.

## 2014-07-26 NOTE — Assessment & Plan Note (Addendum)
IN MALE AGE > 50, BMI > 40 & LIKELY DUE TO GER DOR H PYLORI GASTRITIS, LESS LIKELY LYMPHOMA OR PUD. INSURANCE WON'T COVER OMEPRAZOLE.  CHANGE PROTONIX. TAKE 30 MINUTES PRIOR TO BREAKFAST. EGD FOR DYSPEPSIA/BARRETT'S SCREENING WITH MAC DUE TO POLYPHARMACY. DISCUSSED BARRETT'S, PROCEDURE, BENEFITS, AND RISKS OF PROCEDURE: < 1% chance of medication reaction, OR bleeding. HALF LANTUS ON NIGHT PRIOR TO EGD. HOLD GLIPIZIDE & ACTOSE THE AM OF PROCEDURE. CONTINUE GLUCOPHAGE. MOVI PREP SPLIT DOSING, REGULAR BREAKFAST. CLEAR LIQUIDS AFTER 9 AM.  LOW FAT DIET CONTINUE YOUR WEIGHT LOSS EFFORTS. FOLLOW UP IN 4 MOS.

## 2014-07-26 NOTE — Progress Notes (Signed)
cc'ed to pcp °

## 2014-07-26 NOTE — Progress Notes (Signed)
On recall list 

## 2014-07-26 NOTE — Progress Notes (Signed)
REVIEWED-NO ADDITIONAL RECOMMENDATIONS. 

## 2014-08-03 ENCOUNTER — Other Ambulatory Visit (HOSPITAL_COMMUNITY): Payer: Self-pay | Admitting: Hematology and Oncology

## 2014-08-08 ENCOUNTER — Other Ambulatory Visit (HOSPITAL_COMMUNITY): Payer: Self-pay | Admitting: Oncology

## 2014-08-08 DIAGNOSIS — R609 Edema, unspecified: Secondary | ICD-10-CM

## 2014-08-08 MED ORDER — SPIRONOLACTONE 50 MG PO TABS: 50.0000 mg | ORAL_TABLET | Freq: Every day | ORAL | Status: DC

## 2014-08-08 NOTE — Patient Instructions (Signed)
SUVAN STCYR  08/08/2014   Your procedure is scheduled on:   08/15/2014  Report to [facility] at  615  AM.  Call this number if you have problems the morning of surgery: [phone]   Remember:   Do not eat food or drink liquids after midnight.   Take these medicines the morning of surgery with A SIP OF WATER:  Metoprolol, prozac, imdur, losartan, mobic, prilosec, oxycodone. Take 1/2 of your usual Lantus dosage the night before your procedure.   Do not wear jewelry, make-up or nail polish.  Do not wear lotions, powders, or perfumes.   Do not shave 48 hours prior to surgery. Men may shave face and neck.  Do not bring valuables to the hospital.  [facility] is not responsible for any belongings or valuables.               Contacts, dentures or bridgework may not be worn into surgery.  Leave suitcase in the car. After surgery it may be brought to your room.  For patients admitted to the hospital, discharge time is determined by your treatment team.               Patients discharged the day of surgery will not be allowed to drive home.  Name and phone number of your driver: family  Special Instructions: N/A   Please read over the following fact sheets that you were given: Pain Booklet, Coughing and Deep Breathing, Surgical Site Infection Prevention, Anesthesia Post-op Instructions and Care and Recovery After Surgery Esophagogastroduodenoscopy Esophagogastroduodenoscopy (EGD) is a procedure to examine the lining of the esophagus, stomach, and first part of the small intestine (duodenum). A long, flexible, lighted tube with a camera attached (endoscope) is inserted down the throat to view these organs. This procedure is done to detect problems or abnormalities, such as inflammation, bleeding, ulcers, or growths, in order to treat them. The procedure lasts about 5-20 minutes. It is usually an outpatient procedure, but it may need to be performed in emergency cases in the hospital. LET  YOUR CAREGIVER KNOW ABOUT:   Allergies to food or medicine.  All medicines you are taking, including vitamins, herbs, eyedrops, and over-the-counter medicines and creams.  Use of steroids (by mouth or creams).  Previous problems you or members of your family have had with the use of anesthetics.  Any blood disorders you have.  Previous surgeries you have had.  Other health problems you have.  Possibility of pregnancy, if this applies. RISKS AND COMPLICATIONS  Generally, EGD is a safe procedure. However, as with any procedure, complications can occur. Possible complications include:  Infection.  Bleeding.  Tearing (perforation) of the esophagus, stomach, or duodenum.  Difficulty breathing or not being able to breath.  Excessive sweating.  Spasms of the larynx.  Slowed heartbeat.  Low blood pressure. BEFORE THE PROCEDURE  Do not eat or drink anything for 6-8 hours before the procedure or as directed by your caregiver.  Ask your caregiver about changing or stopping your regular medicines.  If you wear dentures, be prepared to remove them before the procedure.  Arrange for someone to drive you home after the procedure. PROCEDURE   A vein will be accessed to give medicines and fluids. A medicine to relax you (sedative) and a pain reliever will be given through that access into the vein.  A numbing medicine (local anesthetic) may be sprayed on your throat for comfort and to stop you from gagging  or coughing.  A mouth guard may be placed in your mouth to protect your teeth and to keep you from biting on the endoscope.  You will be asked to lie on your left side.  The endoscope is inserted down your throat and into the esophagus, stomach, and duodenum.  Air is put through the endoscope to allow your caregiver to view the lining of your esophagus clearly.  The esophagus, stomach, and duodenum is then examined. During the exam, your caregiver may:  Remove tissue to  be examined under a microscope (biopsy) for inflammation, infection, or other medical problems.  Remove growths.  Remove objects (foreign bodies) that are stuck.  Treat any bleeding with medicines or other devices that stop tissues from bleeding (hot cautery, clipping devices).  Widen (dilate) or stretch narrowed areas of the esophagus and stomach.  The endoscope will then be withdrawn. AFTER THE PROCEDURE  You will be taken to a recovery area to be monitored. You will be able to go home once you are stable and alert.  Do not eat or drink anything until the local anesthetic and numbing medicines have worn off. You may choke.  It is normal to feel bloated, have pain with swallowing, or have a sore throat for a short time. This will wear off.  Your caregiver should be able to discuss his or her findings with you. It will take longer to discuss the test results if any biopsies were taken. Document Released: 09/05/2004 Document Revised: 09/19/2013 Document Reviewed: 04/07/2012 [facility] Patient Information 2015 [place], [place]. This information is not intended to replace advice given to you by your health care provider. Make sure you discuss any questions you have with your health care provider. PATIENT INSTRUCTIONS POST-ANESTHESIA  IMMEDIATELY FOLLOWING SURGERY:  Do not drive or operate machinery for the first twenty four hours after surgery.  Do not make any important decisions for twenty four hours after surgery or while taking narcotic pain medications or sedatives.  If you develop intractable nausea and vomiting or a severe headache please notify your doctor immediately.  FOLLOW-UP:  Please make an appointment with your surgeon as instructed. You do not need to follow up with anesthesia unless specifically instructed to do so.  WOUND CARE INSTRUCTIONS (if applicable):  Keep a dry clean dressing on the anesthesia/puncture wound site if there is drainage.  Once the wound has quit draining  you may leave it open to air.  Generally you should leave the bandage intact for twenty four hours unless there is drainage.  If the epidural site drains for more than 36-48 hours please call the anesthesia department.  QUESTIONS?:  Please feel free to call your physician or the hospital operator if you have any questions, and they will be happy to assist you.

## 2014-08-09 ENCOUNTER — Encounter (HOSPITAL_COMMUNITY): Payer: Self-pay

## 2014-08-09 ENCOUNTER — Encounter (HOSPITAL_COMMUNITY)
Admission: RE | Admit: 2014-08-09 | Discharge: 2014-08-09 | Disposition: A | Discharge status: Home / Self Care | Payer: BC Managed Care – PPO | Source: Ambulatory Visit | Attending: Gastroenterology | Admitting: Gastroenterology

## 2014-08-09 DIAGNOSIS — K2270 Barrett's esophagus without dysplasia: Secondary | ICD-10-CM | POA: Insufficient documentation

## 2014-08-09 DIAGNOSIS — Z01812 Encounter for preprocedural laboratory examination: Secondary | ICD-10-CM | POA: Insufficient documentation

## 2014-08-09 HISTORY — DX: Anxiety disorder, unspecified: F41.9

## 2014-08-09 HISTORY — DX: Unspecified osteoarthritis, unspecified site: M19.90

## 2014-08-09 LAB — CBC
HEMATOCRIT: 40.8 % (ref 39.0–52.0)
HEMOGLOBIN: 14.1 g/dL (ref 13.0–17.0)
MCH: 33 pg (ref 26.0–34.0)
MCHC: 34.6 g/dL (ref 30.0–36.0)
MCV: 95.6 fL (ref 78.0–100.0)
Platelets: 168 10*3/uL (ref 150–400)
RBC: 4.27 MIL/uL (ref 4.22–5.81)
RDW: 12.4 % (ref 11.5–15.5)
WBC: 8 10*3/uL (ref 4.0–10.5)

## 2014-08-09 LAB — BASIC METABOLIC PANEL
Anion gap: 9 (ref 5–15)
BUN: 18 mg/dL (ref 6–23)
CO2: 25 mmol/L (ref 19–32)
Calcium: 9.3 mg/dL (ref 8.4–10.5)
Chloride: 101 mmol/L (ref 96–112)
Creatinine, Ser: 1.24 mg/dL (ref 0.50–1.35)
GFR calc non Af Amer: 63 mL/min — ABNORMAL LOW (ref >90)
GFR, EST AFRICAN AMERICAN: 73 mL/min — AB (ref >90)
GLUCOSE: 334 mg/dL — AB (ref 70–99)
Potassium: 4.4 mmol/L (ref 3.5–5.1)
SODIUM: 135 mmol/L (ref 135–145)

## 2014-08-09 NOTE — Progress Notes (Signed)
   08/09/14 1515  OBSTRUCTIVE SLEEP APNEA  Have you ever been diagnosed with sleep apnea through a sleep study? No  Do you snore loudly (loud enough to be heard through closed doors)?  0  Do you often feel tired, fatigued, or sleepy during the daytime? 0  Has anyone observed you stop breathing during your sleep? 0  Do you have, or are you being treated for high blood pressure? 1  BMI more than 35 kg/m2? 1  Age over 57 years old? 1  Neck circumference greater than 40 cm/16 inches? 1  Gender: 1  Obstructive Sleep Apnea Score 5

## 2014-08-15 ENCOUNTER — Ambulatory Visit (HOSPITAL_COMMUNITY)
Admission: RE | Admit: 2014-08-15 | Discharge: 2014-08-15 | Disposition: A | Discharge status: Home / Self Care | Payer: BC Managed Care – PPO | Source: Ambulatory Visit | Attending: Gastroenterology | Admitting: Gastroenterology

## 2014-08-15 ENCOUNTER — Encounter (HOSPITAL_COMMUNITY)
Admission: RE | Disposition: A | Discharge status: Home / Self Care | Payer: Self-pay | Source: Ambulatory Visit | Attending: Gastroenterology

## 2014-08-15 ENCOUNTER — Encounter (HOSPITAL_COMMUNITY): Payer: Self-pay | Admitting: *Deleted

## 2014-08-15 ENCOUNTER — Ambulatory Visit (HOSPITAL_COMMUNITY): Payer: BC Managed Care – PPO | Admitting: Anesthesiology

## 2014-08-15 DIAGNOSIS — K319 Disease of stomach and duodenum, unspecified: Secondary | ICD-10-CM | POA: Insufficient documentation

## 2014-08-15 DIAGNOSIS — E785 Hyperlipidemia, unspecified: Secondary | ICD-10-CM | POA: Diagnosis not present

## 2014-08-15 DIAGNOSIS — K2970 Gastritis, unspecified, without bleeding: Secondary | ICD-10-CM | POA: Insufficient documentation

## 2014-08-15 DIAGNOSIS — R1013 Epigastric pain: Secondary | ICD-10-CM | POA: Diagnosis present

## 2014-08-15 DIAGNOSIS — I2510 Atherosclerotic heart disease of native coronary artery without angina pectoris: Secondary | ICD-10-CM | POA: Diagnosis not present

## 2014-08-15 DIAGNOSIS — K259 Gastric ulcer, unspecified as acute or chronic, without hemorrhage or perforation: Secondary | ICD-10-CM | POA: Insufficient documentation

## 2014-08-15 DIAGNOSIS — K219 Gastro-esophageal reflux disease without esophagitis: Secondary | ICD-10-CM | POA: Insufficient documentation

## 2014-08-15 DIAGNOSIS — Z955 Presence of coronary angioplasty implant and graft: Secondary | ICD-10-CM | POA: Insufficient documentation

## 2014-08-15 DIAGNOSIS — E669 Obesity, unspecified: Secondary | ICD-10-CM | POA: Insufficient documentation

## 2014-08-15 DIAGNOSIS — E119 Type 2 diabetes mellitus without complications: Secondary | ICD-10-CM | POA: Insufficient documentation

## 2014-08-15 DIAGNOSIS — Z7982 Long term (current) use of aspirin: Secondary | ICD-10-CM | POA: Diagnosis not present

## 2014-08-15 DIAGNOSIS — I10 Essential (primary) hypertension: Secondary | ICD-10-CM | POA: Diagnosis not present

## 2014-08-15 DIAGNOSIS — Z794 Long term (current) use of insulin: Secondary | ICD-10-CM | POA: Insufficient documentation

## 2014-08-15 DIAGNOSIS — I252 Old myocardial infarction: Secondary | ICD-10-CM | POA: Insufficient documentation

## 2014-08-15 DIAGNOSIS — Z87891 Personal history of nicotine dependence: Secondary | ICD-10-CM | POA: Insufficient documentation

## 2014-08-15 DIAGNOSIS — D6862 Lupus anticoagulant syndrome: Secondary | ICD-10-CM | POA: Diagnosis not present

## 2014-08-15 DIAGNOSIS — R109 Unspecified abdominal pain: Secondary | ICD-10-CM | POA: Diagnosis not present

## 2014-08-15 HISTORY — PX: BIOPSY: SHX5522

## 2014-08-15 HISTORY — PX: ESOPHAGOGASTRODUODENOSCOPY (EGD) WITH PROPOFOL: SHX5813

## 2014-08-15 LAB — GLUCOSE, CAPILLARY
Glucose-Capillary: 248 mg/dL — ABNORMAL HIGH (ref 70–99)
Glucose-Capillary: 231 mg/dL — ABNORMAL HIGH (ref 70–99)

## 2014-08-15 SURGERY — ESOPHAGOGASTRODUODENOSCOPY (EGD) WITH PROPOFOL
Anesthesia: Monitor Anesthesia Care

## 2014-08-15 MED ORDER — LIDOCAINE HCL (PF) 1 % IJ SOLN
INTRAMUSCULAR | Status: AC
  Filled 2014-08-15: qty 5

## 2014-08-15 MED ORDER — FENTANYL CITRATE 0.05 MG/ML IJ SOLN: 25.0000 ug | INTRAMUSCULAR | Status: DC | PRN

## 2014-08-15 MED ORDER — LIDOCAINE HCL (CARDIAC) 10 MG/ML IV SOLN
INTRAVENOUS | Status: DC | PRN
  Administered 2014-08-15: 40 mg via INTRAVENOUS

## 2014-08-15 MED ORDER — MIDAZOLAM HCL 2 MG/2ML IJ SOLN
1.0000 mg | INTRAMUSCULAR | Status: DC | PRN
  Administered 2014-08-15: 2 mg via INTRAVENOUS

## 2014-08-15 MED ORDER — PROPOFOL INFUSION 10 MG/ML OPTIME
INTRAVENOUS | Status: DC | PRN
  Administered 2014-08-15: 150 ug/kg/min via INTRAVENOUS
  Administered 2014-08-15: 75 ug/kg/min via INTRAVENOUS

## 2014-08-15 MED ORDER — PROPOFOL 10 MG/ML IV BOLUS
INTRAVENOUS | Status: DC | PRN
  Administered 2014-08-15: 15 mg via INTRAVENOUS

## 2014-08-15 MED ORDER — ONDANSETRON HCL 4 MG/2ML IJ SOLN
4.0000 mg | Freq: Once | INTRAMUSCULAR | Status: AC
  Administered 2014-08-15: 4 mg via INTRAVENOUS

## 2014-08-15 MED ORDER — MIDAZOLAM HCL 2 MG/2ML IJ SOLN
INTRAMUSCULAR | Status: AC
  Filled 2014-08-15: qty 2

## 2014-08-15 MED ORDER — PROPOFOL 10 MG/ML IV BOLUS
INTRAVENOUS | Status: AC
  Filled 2014-08-15: qty 20

## 2014-08-15 MED ORDER — LIDOCAINE VISCOUS 2 % MT SOLN
OROMUCOSAL | Status: AC
  Filled 2014-08-15: qty 15

## 2014-08-15 MED ORDER — FENTANYL CITRATE 0.05 MG/ML IJ SOLN
25.0000 ug | INTRAMUSCULAR | Status: AC
  Administered 2014-08-15 (×2): 25 ug via INTRAVENOUS

## 2014-08-15 MED ORDER — LIDOCAINE VISCOUS 2 % MT SOLN
OROMUCOSAL | Status: DC | PRN
  Administered 2014-08-15: 4 mL via OROMUCOSAL

## 2014-08-15 MED ORDER — ONDANSETRON HCL 4 MG/2ML IJ SOLN: 4.0000 mg | Freq: Once | INTRAMUSCULAR | Status: DC | PRN

## 2014-08-15 MED ORDER — LIDOCAINE VISCOUS 2 % MT SOLN
4.0000 mL | Freq: Once | OROMUCOSAL | Status: AC
  Administered 2014-08-15: 4 mL via OROMUCOSAL

## 2014-08-15 MED ORDER — LACTATED RINGERS IV SOLN
INTRAVENOUS | Status: DC
  Administered 2014-08-15: 1000 mL via INTRAVENOUS

## 2014-08-15 MED ORDER — ONDANSETRON HCL 4 MG/2ML IJ SOLN
INTRAMUSCULAR | Status: AC
  Filled 2014-08-15: qty 2

## 2014-08-15 MED ORDER — FENTANYL CITRATE 0.05 MG/ML IJ SOLN
INTRAMUSCULAR | Status: AC
  Filled 2014-08-15: qty 2

## 2014-08-15 SURGICAL SUPPLY — 21 items
BLOCK BITE 60FR ADLT L/F BLUE (MISCELLANEOUS) ×2 IMPLANT
ELECT REM PT RETURN 9FT ADLT (ELECTROSURGICAL)
ELECTRODE REM PT RTRN 9FT ADLT (ELECTROSURGICAL) IMPLANT
FLOOR PAD 36X40 (MISCELLANEOUS) ×4
FORCEPS BIOP RAD 4 LRG CAP 4 (CUTTING FORCEPS) ×2 IMPLANT
FORMALIN 10 PREFIL 20ML (MISCELLANEOUS) ×2 IMPLANT
KIT CLEAN ENDO COMPLIANCE (KITS) ×2 IMPLANT
LUBRICANT JELLY 4.5OZ STERILE (MISCELLANEOUS) ×2 IMPLANT
MANIFOLD NEPTUNE II (INSTRUMENTS) ×4 IMPLANT
NDL SCLEROTHERAPY 25GX240 (NEEDLE) IMPLANT
NEEDLE SCLEROTHERAPY 25GX240 (NEEDLE) IMPLANT
OVERTUBE ENDOCUFF GREEN (MISCELLANEOUS) ×2 IMPLANT
PAD FLOOR 36X40 (MISCELLANEOUS) IMPLANT
PROBE APC STR FIRE (PROBE) IMPLANT
PROBE INJECTION GOLD (MISCELLANEOUS)
PROBE INJECTION GOLD 7FR (MISCELLANEOUS) IMPLANT
SNARE SHORT THROW 13M SML OVAL (MISCELLANEOUS) IMPLANT
SYR 50ML LL SCALE MARK (SYRINGE) ×2 IMPLANT
SYR INFLATION 60ML (SYRINGE) IMPLANT
TUBING IRRIGATION ENDOGATOR (MISCELLANEOUS) ×2 IMPLANT
WATER STERILE IRR 1000ML POUR (IV SOLUTION) ×2 IMPLANT

## 2014-08-15 NOTE — Anesthesia Preprocedure Evaluation (Signed)
Anesthesia Evaluation  Patient identified by MRN, date of birth, ID band Patient awake    Reviewed: Allergy & Precautions, NPO status , Patient's Chart, lab work & pertinent test results, reviewed documented beta blocker date and time   Airway Mallampati: II  TM Distance: >3 FB     Dental  (+) Edentulous Upper, Edentulous Lower   Pulmonary former smoker,  breath sounds clear to auscultation        Cardiovascular hypertension, Pt. on home beta blockers and Pt. on medications + CAD, + Past MI and + Cardiac Stents Rhythm:Regular Rate:Normal     Neuro/Psych PSYCHIATRIC DISORDERS Anxiety    GI/Hepatic PUD, GERD-  Medicated,  Endo/Other  diabetes, Type 2, Oral Hypoglycemic Agents, Insulin DependentMorbid obesity  Renal/GU      Musculoskeletal   Abdominal   Peds  Hematology  (+) Blood dyscrasia, ,   Anesthesia Other Findings   Reproductive/Obstetrics                             Anesthesia Physical Anesthesia Plan  ASA: III  Anesthesia Plan: MAC   Post-op Pain Management:    Induction: Intravenous  Airway Management Planned: Simple Face Mask  Additional Equipment:   Intra-op Plan:   Post-operative Plan:   Informed Consent: I have reviewed the patients History and Physical, chart, labs and discussed the procedure including the risks, benefits and alternatives for the proposed anesthesia with the patient or authorized representative who has indicated his/her understanding and acceptance.     Plan Discussed with:   Anesthesia Plan Comments:         Anesthesia Quick Evaluation

## 2014-08-15 NOTE — H&P (View-Only) (Signed)
Subjective:    Patient [name], male    DOB: November 21, 1955, 59 y.o.   MRN: [id number]  Alonza Bogus, MD  HPI Has long standing history of epigastric/UPPER ABDOMINAL pain. Changed toomeprazole and Sx resolved. NO WEIGHT LOSS. BMs: 1-3X/DAY. NO SLEEP APNEA AND NEVER BEEN DIFFICULT TO SEDATE.  PT DENIES FEVER, CHILLS, HEMATOCHEZIA, nausea, vomiting, melena, diarrhea, CHEST PAIN, SHORTNESS OF BREATH,  CHANGE IN BOWEL IN HABITS, constipation, problems swallowing, problems with sedation, OR heartburn or indigestion.   Past Medical History  Diagnosis Date  . Type 2 diabetes mellitus   . Coronary atherosclerosis of native coronary artery     BMS LAD and PTCA RCA 2000,BMS RCA/PLA 2001  . MI (myocardial infarction) 2001  . Hyperlipidemia   . GERD (gastroesophageal reflux disease)   . Low-grade NHL (non-Hodgkin's lymphoma)   . Lupus anticoagulant positive   . Gastric ulcer   . Diabetes mellitus   . HTN (hypertension)     Past Surgical History  Procedure Laterality Date  . Inguinal lymph node biopsy      right inguinal lymph node biopsy  . Back surgery      multiple  . Neck surgery    . Axillary abscess      left incision and drainage left  . Dental extractions    . Colonoscopy  June 2008    Dr. Dellis Filbert Medoff: Mild left colonic diverticulosis  . Esophagogastroduodenoscopy  June 2008    Dr. Dellis Filbert Medoff: Gastric ulcer, antral, biopsy with reactive changes associated glandular atrophy, no H pylori. No features of lymphoma. Likely NSAID related  . Shoulder arthroscopy    . Cardiac stents    . Esophagogastroduodenoscopy  August 2008    Dr. Richmond Campbell: Complete healing of gastric ulcers.   No Known Allergies  Current Outpatient Prescriptions  Medication Sig Dispense Refill  . aspirin 81 MG chewable tablet Chew 81 mg by mouth daily.      Marland Kitchen FLUoxetine (PROZAC) 20 MG capsule Take 1 capsule (20 mg total) by mouth daily. (Patient taking differently: Take 20 mg by  mouth 2 (two) times daily. ) 30 capsule 3  . glyBURIDE (DIABETA) 5 MG tablet Take 10 mg by mouth 2 (two) times daily.      . insulin glargine (LANTUS) 100 UNIT/ML injection Inject 65 Units into the skin 2 (two) times daily. Pt is adjusting his own doses and varies    . isosorbide mononitrate (IMDUR) 30 MG 24 hr tablet TAKE 1 TABLET BY MOUTH EVERY DAY    . losartan (COZAAR) 100 MG tablet Take 100 mg by mouth daily.    . meloxicam (MOBIC) 7.5 MG tablet Take 1 tablet (7.5 mg total) by mouth daily.    . metFORMIN (GLUCOPHAGE) 500 MG tablet Take 1,000 mg by mouth 2 (two) times daily with a meal.      . metoprolol (LOPRESSOR) 50 MG tablet Take 25 mg by mouth 2 (two) times daily.      . nitroGLYCERIN (NITROSTAT) 0.4 MG SL tablet Place 1 tablet (0.4 mg total) under the tongue every 5 (five) minutes as needed.    Marland Kitchen omeprazole (PRILOSEC) 20 MG capsule Take 20 mg by mouth daily. otc   . oxyCODONE-acetaminophen (PERCOCET) 10-325 MG per tablet Take 1 tablet by mouth every 4 (four) hours as needed for pain.    . pioglitazone (ACTOS) 15 MG tablet Take 15 mg by mouth daily.      . simvastatin (ZOCOR) 10 MG tablet TAKE  1 TABLET BY MOUTH EVERY NIGHT AT BEDTIME    . zolpidem (AMBIEN) 10 MG tablet Take 10 mg by mouth at bedtime as needed for sleep.    .      . spironolactone (ALDACTONE) 50 MG tablet Take 1 tablet (50 mg total) by mouth daily.     Family History  Problem Relation Age of Onset  . Pancreatic cancer Maternal Grandmother   . Colon cancer Neg Hx     History  Substance Use Topics  . Smoking status: Former Smoker -- 1.00 packs/day for 23 years    Types: Cigarettes    Start date: 10/30/1970    Quit date: 10/29/1993  . Smokeless tobacco: Former Systems developer    Types: Snuff, Chew  . Alcohol Use: No   Review of Systems PER HPI OTHERWISE ALL SYSTEMS ARE NEGATIVE.    Objective:   Physical Exam  Constitutional: He is oriented to person, place, and time. He appears well-developed and well-nourished. No  distress.  HENT:  Head: Normocephalic and atraumatic.  Mouth/Throat: Oropharynx is clear and moist. No oropharyngeal exudate.  Eyes: Pupils are equal, round, and reactive to light. No scleral icterus.  Neck: Normal range of motion. Neck supple.  Cardiovascular: Normal rate, regular rhythm and normal heart sounds.   Pulmonary/Chest: Effort normal and breath sounds normal. No respiratory distress.  Abdominal: Soft. Bowel sounds are normal. He exhibits no distension. There is no tenderness.  Musculoskeletal: He exhibits no edema.  Lymphadenopathy:    He has no cervical adenopathy.  Neurological: He is alert and oriented to person, place, and time.  NO FOCAL DEFICITS    Psychiatric:  SLIGHTLY ANXIOUS MOOD, NL AFFECT   Vitals reviewed.         Assessment & Plan:

## 2014-08-15 NOTE — Discharge Instructions (Signed)
YOUR ABDOMINAL PAIN IS DUE TO ULCERS & GASTRITIS FROM USING ASPIRIN BUT NOT TAKING OMEPRAZOLE OR PROTONIX. I BIOPSIED YOUR STOMACH.   STRICTLY AVOID BC/GOODY POWDERS, IBUPROFEN/MOTRIN, OR NAPROXEN/ALEVE for 2 weeks BECAUSE YOU HAVE A ULCERS & GASTRITIS.  USE TYLENOL AS NEEDED FOR PAIN.  CONTINUE OMEPRAZOLE.  TAKE 30 MINUTES PRIOR TO YOUR FIRST MEAL.  FOLLOW A LOW FAT DIET. SEE INFO BELOW.  YOUR BIOPSY RESULTS WILL BE AVAILABLE IN MY CHART AFTER MAR 31 AND  OR MY OFFICE WILL CONTACT YOU IN 10-14 DAYS WITH YOUR RESULTS.   REPEAT UPPER ENDOSCOPY IN 3 MO TO MAKE SURE YOUR ULCER IS HEALED.   FOLLOW UP IN 6 MOS.     UPPER ENDOSCOPY AFTER CARE Read the instructions outlined below and refer to this sheet in the next week. These discharge instructions provide you with general information on caring for yourself after you leave the hospital. While your treatment has been planned according to the most current medical practices available, unavoidable complications occasionally occur. If you have any problems or questions after discharge, call DR. Nekeshia Lenhardt, [phone].  ACTIVITY  You may resume your regular activity, but move at a slower pace for the next 24 hours.   Take frequent rest periods for the next 24 hours.   Walking will help get rid of the air and reduce the bloated feeling in your belly (abdomen).   No driving for 24 hours (because of the medicine (anesthesia) used during the test).   You may shower.   Do not sign any important legal documents or operate any machinery for 24 hours (because of the anesthesia used during the test).    NUTRITION  Drink plenty of fluids.   You may resume your normal diet as instructed by your doctor.   Begin with a light meal and progress to your normal diet. Heavy or fried foods are harder to digest and may make you feel sick to your stomach (nauseated).   Avoid alcoholic beverages for 24 hours or as instructed.    MEDICATIONS  You may  resume your normal medications.   WHAT YOU CAN EXPECT TODAY  Some feelings of bloating in the abdomen.   Passage of more gas than usual.    IF YOU HAD A BIOPSY TAKEN DURING THE UPPER ENDOSCOPY:    Eat a soft diet IF YOU HAVE NAUSEA, BLOATING, ABDOMINAL PAIN, OR VOMITING.    FINDING OUT THE RESULTS OF YOUR TEST Not all test results are available during your visit. DR. Oneida Alar WILL CALL YOU WITHIN 14 DAYS OF YOUR PROCEDUE WITH YOUR RESULTS. Do not assume everything is normal if you have not heard from DR. Jennifr Gaeta, CALL HER OFFICE AT [phone].  SEEK IMMEDIATE MEDICAL ATTENTION AND CALL THE OFFICE: [phone] IF:  You have more than a spotting of blood in your stool.   Your belly is swollen (abdominal distention).   You are nauseated or vomiting.   You have a temperature over 101F.   You have abdominal pain or discomfort that is severe or gets worse throughout the day.   Ulcer Disease (Peptic Ulcer, Gastric Ulcer, Duodenal Ulcer) You have an ulcer. This may be in your stomach (gastric ulcer) or in the first part of your small bowel, the duodenum (duodenal ulcer). An ulcer is a break in the lining of the stomach or duodenum. The ulcer causes erosion into the deeper tissue.  CAUSES The stomach has a lining to protect itself from the acid that digests food. The lining  can be damaged in two main ways:  The Helico Pylori bacteria (H. Pyolori) can infect the lining of the stomach and cause ulcers.   Nonsteroidal, anti-inflammatory medications (NSAIDS) can cause gastric ulcerations.   Smoking tobacco can increase the acid in the stomach. This can lead to ulcers, and will impair healing of ulcers.   Other factors, such as alcohol use and stress may contribute to ulcer formation.  SYMPTOMS The problems (symptoms) of ulcer disease are usually a burning or gnawing of the mid-upper belly (abdomen). This is often worse on an empty stomach and may get better with food. This may be  associated with feeling sick to your stomach (nausea), bloating, and vomiting.  HOME CARE INSTRUCTIONS Continue regular work and usual activities unless advised otherwise by your caregiver.  Avoid tobacco, alcohol, and caffeine. Tobacco use will decrease and slow the rates of healing.   Avoid foods that seem to aggravate or cause discomfort.    Low-Fat Diet BREADS, CEREALS, PASTA, RICE, DRIED PEAS, AND BEANS These products are high in carbohydrates and most are low in fat. Therefore, they can be increased in the diet as substitutes for fatty foods. They too, however, contain calories and should not be eaten in excess. Cereals can be eaten for snacks as well as for breakfast.   FRUITS AND VEGETABLES It is good to eat fruits and vegetables. Besides being sources of fiber, both are rich in vitamins and some minerals. They help you get the daily allowances of these nutrients. Fruits and vegetables can be used for snacks and desserts.  MEATS Limit lean meat, chicken, [place], and fish to no more than 6 ounces per day. Beef, Pork, and Lamb Use lean cuts of beef, pork, and lamb. Lean cuts include:  Extra-lean ground beef.  Arm roast.  Sirloin tip.  Center-cut ham.  Round steak.  Loin chops.  Rump roast.  Tenderloin.  Trim all fat off the outside of meats before cooking. It is not necessary to severely decrease the intake of red meat, but lean choices should be made. Lean meat is rich in protein and contains a highly absorbable form of iron. Premenopausal women, in particular, should avoid reducing lean red meat because this could increase the risk for low red blood cells (iron-deficiency anemia).  Chicken and [place] These are good sources of protein. The fat of poultry can be reduced by removing the skin and underlying fat layers before cooking. Chicken and [place] can be substituted for lean red meat in the diet. Poultry should not be fried or covered with high-fat sauces. Fish and  Shellfish Fish is a good source of protein. Shellfish contain cholesterol, but they usually are low in saturated fatty acids. The preparation of fish is important. Like chicken and [place], they should not be fried or covered with high-fat sauces. EGGS Egg whites contain no fat or cholesterol. They can be eaten often. Try 1 to 2 egg whites instead of whole eggs in recipes or use egg substitutes that do not contain yolk. MILK AND DAIRY PRODUCTS Use skim or 1% milk instead of 2% or whole milk. Decrease whole milk, natural, and processed cheeses. Use nonfat or low-fat (2%) cottage cheese or low-fat cheeses made from vegetable oils. Choose nonfat or low-fat (1 to 2%) yogurt. Experiment with evaporated skim milk in recipes that call for heavy cream. Substitute low-fat yogurt or low-fat cottage cheese for sour cream in dips and salad dressings. Have at least 2 servings of low-fat dairy products, such as  2 glasses of skim (or 1%) milk each day to help get your daily calcium intake. FATS AND OILS Reduce the total intake of fats, especially saturated fat. Butterfat, lard, and beef fats are high in saturated fat and cholesterol. These should be avoided as much as possible. Vegetable fats do not contain cholesterol, but certain vegetable fats, such as coconut oil, palm oil, and palm kernel oil are very high in saturated fats. These should be limited. These fats are often used in bakery goods, processed foods, popcorn, oils, and nondairy creamers. Vegetable shortenings and some peanut butters contain hydrogenated oils, which are also saturated fats. Read the labels on these foods and check for saturated vegetable oils. Unsaturated vegetable oils and fats do not raise blood cholesterol. However, they should be limited because they are fats and are high in calories. Total fat should still be limited to 30% of your daily caloric intake. Desirable liquid vegetable oils are corn oil, cottonseed oil, olive oil, canola oil,  safflower oil, soybean oil, and sunflower oil. Peanut oil is not as good, but small amounts are acceptable. Buy a heart-healthy tub margarine that has no partially hydrogenated oils in the ingredients. Mayonnaise and salad dressings often are made from unsaturated fats, but they should also be limited because of their high calorie and fat content. Seeds, nuts, peanut butter, olives, and avocados are high in fat, but the fat is mainly the unsaturated type. These foods should be limited mainly to avoid excess calories and fat. OTHER EATING TIPS Snacks  Most sweets should be limited as snacks. They tend to be rich in calories and fats, and their caloric content outweighs their nutritional value. Some good choices in snacks are graham crackers, melba toast, soda crackers, bagels (no egg), English muffins, fruits, and vegetables. These snacks are preferable to snack crackers, [place] fries, TORTILLA CHIPS, and POTATO chips. Popcorn should be air-popped or cooked in small amounts of liquid vegetable oil. Desserts Eat fruit, low-fat yogurt, and fruit ices instead of pastries, cake, and cookies. Sherbet, angel food cake, gelatin dessert, frozen low-fat yogurt, or other frozen products that do not contain saturated fat (pure fruit juice bars, frozen ice pops) are also acceptable.  COOKING METHODS Choose those methods that use little or no fat. They include: Poaching.  Braising.  Steaming.  Grilling.  Baking.  Stir-frying.  Broiling.  Microwaving.  Foods can be cooked in a nonstick pan without added fat, or use a nonfat cooking spray in regular cookware. Limit fried foods and avoid frying in saturated fat. Add moisture to lean meats by using water, broth, cooking wines, and other nonfat or low-fat sauces along with the cooking methods mentioned above. Soups and stews should be chilled after cooking. The fat that forms on top after a few hours in the refrigerator should be skimmed off. When preparing meals,  avoid using excess salt. Salt can contribute to raising blood pressure in some people.  EATING AWAY FROM HOME Order entres, potatoes, and vegetables without sauces or butter. When meat exceeds the size of a deck of cards (3 to 4 ounces), the rest can be taken home for another meal. Choose vegetable or fruit salads and ask for low-calorie salad dressings to be served on the side. Use dressings sparingly. Limit high-fat toppings, such as bacon, crumbled eggs, cheese, sunflower seeds, and olives. Ask for heart-healthy tub margarine instead of butter.

## 2014-08-15 NOTE — Interval H&P Note (Signed)
History and Physical Interval Note:  08/15/2014 7:11 AM  Kelly [name]  has presented today for surgery, with the diagnosis of dyspepsia, barretts surv  The various methods of treatment have been discussed with the patient and family. After consideration of risks, benefits and other options for treatment, the patient has consented to  Procedure(s) with comments: ESOPHAGOGASTRODUODENOSCOPY (EGD) WITH PROPOFOL (N/A) - 730am as a surgical intervention .  The patient's history has been reviewed, patient examined, no change in status, stable for surgery.  I have reviewed the patient's chart and labs.  Questions were answered to the patient's satisfaction.     [employer]

## 2014-08-15 NOTE — Anesthesia Procedure Notes (Signed)
Procedure Name: MAC Date/Time: 08/15/2014 7:32 AM Performed by: Vista Deck Pre-anesthesia Checklist: Patient identified, Emergency Drugs available, Suction available, Timeout performed and Patient being monitored Patient Re-evaluated:Patient Re-evaluated prior to inductionOxygen Delivery Method: Non-rebreather mask

## 2014-08-15 NOTE — Anesthesia Postprocedure Evaluation (Signed)
  Anesthesia Post-op Note  Patient: Kelly [name]  Procedure(s) Performed: Procedure(s) with comments: ESOPHAGOGASTRODUODENOSCOPY (EGD) WITH PROPOFOL (N/A) - 730am BIOPSY - Gastric  Patient Location: PACU  Anesthesia Type:MAC  Level of Consciousness: awake and alert   Airway and Oxygen Therapy: Patient Spontanous Breathing and non-rebreather face mask  Post-op Pain: none  Post-op Assessment: Post-op Vital signs reviewed, Patient's Cardiovascular Status Stable, Respiratory Function Stable, Patent Airway and No signs of Nausea or vomiting  Post-op Vital Signs: Reviewed and stable    Complications: No apparent anesthesia complications

## 2014-08-15 NOTE — Transfer of Care (Signed)
Immediate Anesthesia Transfer of Care Note  Patient: Kelly [name]  Procedure(s) Performed: Procedure(s) with comments: ESOPHAGOGASTRODUODENOSCOPY (EGD) WITH PROPOFOL (N/A) - 730am BIOPSY - Gastric  Patient Location: PACU  Anesthesia Type:MAC  Level of Consciousness: awake, alert  and patient cooperative  Airway & Oxygen Therapy: Patient Spontanous Breathing  Nasal Cannula Post-op Assessment: Report given to RN, Post -op Vital signs reviewed and stable and Patient moving all extremities  Post vital signs: Reviewed and stable    Complications: No apparent anesthesia complications

## 2014-08-15 NOTE — Op Note (Signed)
[facility] [address] [place], [address]   ENDOSCOPY PROCEDURE REPORT  PATIENT: Kelly [name], Kelly [name]  MR#: [id number] BIRTHDATE: [birth year] , 42  yrs. old GENDER: male  ENDOSCOPIST: Danie Binder, MD REFERRED RD:EYCXKG Hawkins, M.D. PROCEDURE DATE: Sep 11, 2014 PROCEDURE:   EGD w/ biopsy INDICATIONS:dyspepsia. on ASA.INTREMITTENTLY TAKES PPI BECAUSE INSURANCE WON'T COVER OMEPRAZOLE OR PROTONIX. MEDICATIONS: Monitored anesthesia care TOPICAL ANESTHETIC:   Viscous Xylocaine ASA CLASS:  DESCRIPTION OF PROCEDURE:     Physical exam was performed.  Informed consent was obtained from the patient after explaining the benefits, risks, and alternatives to the procedure.  The patient was connected to the monitor and placed in the left lateral position.  Continuous oxygen was provided by nasal cannula and IV medicine administered through an indwelling cannula.  After administration of sedation, the patients esophagus was intubated and the     endoscope was advanced under direct visualization to the second portion of the duodenum.  The scope was removed slowly by carefully examining the color, texture, anatomy, and integrity of the mucosa on the way out.  The patient was recovered in endoscopy and discharged home in satisfactory condition.    ESOPHAGUS: The mucosa of the esophagus appeared normal.   STOMACH: Two small non-bleeding and punctate ulcers with heaped up edges were found in the prepyloric region of the stomach.  Biopsies were taken around the ulcers.   Moderate non-erosive gastritis (inflammation) was found in the gastric antrum and gastric body. Multiple biopsies were performed using cold forceps. DUODENUM: The duodenal mucosa showed no abnormalities in the bulb and 2nd part of the duodenum. COMPLICATIONS: There were no immediate complications.  ENDOSCOPIC IMPRESSION: 1.   ABDOMINAL PAIN DUE TO ULCERS & GASTRITIS FROM USING ASPIRIN BUT NOT TAKING A  PPI 2.   Two small GASTRIC ulcers 3.   MODERATE Non-erosive gastritis  RECOMMENDATIONS: AWAIT BIOPSY. STRICTLY AVOID BC/GOODY POWDERS, IBUPROFEN/MOTRIN, OR NAPROXEN/ALEVE FOR 2 WEEKS. USE TYLENOL AS NEEDED FOR PAIN. CONTINUE OMEPRAZOLE.  TAKE 30 MINUTES PRIOR TO FIRST MEAL. FOLLOW A LOW FAT DIET. REPEAT UPPER ENDOSCOPY IN 3 MO TO ASSESS HEALING. FOLLOW UP IN 6 MOS.  REPEAT EXAM: eSigned:  Danie Binder, MD 09/11/2014 8:38 AM  CPT CODES: ICD CODES:  The ICD and CPT codes recommended by this software are interpretations from the data that the clinical staff has captured with the software.  The verification of the translation of this report to the ICD and CPT codes and modifiers is the sole responsibility of the health care institution and practicing physician where this report was generated.  [employer] will not be held responsible for the validity of the ICD and CPT codes included on this report.  AMA assumes no liability for data contained or not contained herein. CPT is a Designer, television/film set of the [employer].

## 2014-08-16 ENCOUNTER — Encounter (HOSPITAL_COMMUNITY): Payer: Self-pay | Admitting: Gastroenterology

## 2014-08-31 ENCOUNTER — Telehealth: Payer: Self-pay | Admitting: Gastroenterology

## 2014-08-31 NOTE — Telephone Encounter (Signed)
ON RECALL FOR FU OV AND REPEAT ENDO

## 2014-08-31 NOTE — Telephone Encounter (Signed)
Called pt and LMOM.  

## 2014-08-31 NOTE — Telephone Encounter (Signed)
Please call pt. His stomach Bx shows gastritis.  STRICTLY AVOID BC/GOODY POWDERS, IBUPROFEN/MOTRIN, OR NAPROXEN/ALEVE for 2 weeks BECAUSE YOU HAVE A ULCERS & GASTRITIS.  USE TYLENOL AS NEEDED FOR PAIN.  CONTINUE OMEPRAZOLE.  TAKE 30 MINUTES PRIOR TO YOUR FIRST MEAL.  FOLLOW A LOW FAT DIET.   REPEAT UPPER ENDOSCOPY IN 3 MO TO MAKE SURE YOUR ULCER IHAS HEALED.   FOLLOW UP IN 6 MOS.

## 2014-09-01 ENCOUNTER — Other Ambulatory Visit: Payer: Self-pay | Admitting: Nurse Practitioner

## 2014-09-01 NOTE — Telephone Encounter (Signed)
Patient advised to hold NSAIDs for 2 weeks post-EGD.

## 2014-09-04 NOTE — Telephone Encounter (Signed)
Pt is aware of result. He said that his is out of town the whole month of July.

## 2014-09-04 NOTE — Telephone Encounter (Signed)
LMOM for a return call and also mailed a letter to call.

## 2014-09-28 ENCOUNTER — Encounter: Payer: Self-pay | Admitting: Gastroenterology

## 2014-10-02 ENCOUNTER — Other Ambulatory Visit: Payer: Self-pay | Admitting: Nurse Practitioner

## 2014-10-13 ENCOUNTER — Encounter: Payer: BC Managed Care – PPO | Attending: "Endocrinology | Admitting: Nutrition

## 2014-10-13 VITALS — Ht 75.0 in | Wt 318.0 lb

## 2014-10-13 DIAGNOSIS — E668 Other obesity: Secondary | ICD-10-CM

## 2014-10-13 DIAGNOSIS — Z6839 Body mass index (BMI) 39.0-39.9, adult: Secondary | ICD-10-CM | POA: Insufficient documentation

## 2014-10-13 DIAGNOSIS — Z794 Long term (current) use of insulin: Secondary | ICD-10-CM | POA: Diagnosis not present

## 2014-10-13 DIAGNOSIS — E1165 Type 2 diabetes mellitus with hyperglycemia: Secondary | ICD-10-CM

## 2014-10-13 DIAGNOSIS — Z713 Dietary counseling and surveillance: Secondary | ICD-10-CM | POA: Diagnosis not present

## 2014-10-13 DIAGNOSIS — E118 Type 2 diabetes mellitus with unspecified complications: Secondary | ICD-10-CM | POA: Insufficient documentation

## 2014-10-13 DIAGNOSIS — IMO0002 Reserved for concepts with insufficient information to code with codable children: Secondary | ICD-10-CM

## 2014-10-13 NOTE — Progress Notes (Signed)
  Medical Nutrition Therapy:  Appt start time: 0930 end time:  1030.   Assessment:  Primary concerns today: Diabetes Type 2. A1C 8.9%. Sees Dr. Dorris Fetch. S/p 5 stents placement due to CVD. Lantus 70 units daily.  Novolog 15 units with meals. Victoza 1.2 mg  LIves with his wife. His wife does the shopping and cooking. Most foods are baked, broiled gilled and fried. Physical activity: none Test 4- 5 times per day. 9 lb weight gain since visit with Dr. Dorris Fetch. Diet is high fat, low fiber and excessive in calories, and sodium. Needs to exercise for weight loss. He is now taking his insulin pen with him when he goes out to eat instead of forgetting it.   Preferred Learning Style:   Auditory  Visual  Hands on  No preference indicated   Learning Readiness  Not ready  Contemplating  Ready  Change in progress   MEDICATIONS: See list   DIETARY INTAKE:  24-hr recall:  B ( AM): Bacon egg sanwich with ww toast or lite mayo or oatmeal with brown sugar. Snk ( AM): none usually: strawberries and blueberries  L ( PM): meatloaf, creamed potatoes, cabbage, water Snk ( PM): none usually or banana D ( PM): Meatloaf, creamed potatoes, cabbage water Snk ( PM): none Beverages: water  Usual physical activity: walking at work  Estimated energy needs: 1800 calories 200 g carbohydrates 135 g protein 50 g fat  Progress Towards Goal(s):  In progress.   Nutritional Diagnosis:  NB-1.1 Food and nutrition-related knowledge deficit As related to Diabetes.  As evidenced by A1C 8.9%..    Intervention:  Nutrition counseling and diabetes education provided on a low fat high fiber low sodium CHO restricted diet, MY Plate, CHO counting, meal planing, portion sizes, target ranges for blood sugars, signs and symptoms of hyper/hypoglycemia and treatment of both, testing and monitoring BS, benefits of exercise for needed weight loss and complications related to DM.  Goals:  1. Follow My Plate as discussed. 2.  Avoid snacks between meals. 3. Cut out processed meats and junk food. 4. Increase fresh fruits and vegetables to 5 servings per day. 6. Drink only water- no diet sodas. 7. Test blood sugars 4 times per day and record on BS log. 8. Get A1C to 7.5% in three months. 9. Lose 2 lbs per week til next visit. 10. Use only Mrs. Dash and no salt or salt substitutes.  Teaching Method Utilized:  Visual Auditory Hands on  Handouts given during visit include:  The Plate Method  The Meal Plan Card  Weight loss Tips  Diabetes Instructions.  Barriers to learning/adherence to lifestyle change: None  Demonstrated degree of understanding via:  Teach Back   Monitoring/Evaluation:  Dietary intake, exercise, meal planning, SBG, and body weight in 1 month(s).

## 2014-10-18 ENCOUNTER — Encounter: Payer: Self-pay | Admitting: Nutrition

## 2014-10-18 NOTE — Patient Instructions (Signed)
Goals:  1. Follow My Plate as discussed. 2. Avoid snacks between meals. 3. Cut out processed meats and junk food. 4. Increase fresh fruits and vegetables to 5 servings per day. 6. Drink only water- no diet sodas. 7. Test blood sugars 4 times per day and record on BS log. 8. Get A1C to 7.5% in three months. 9. Lose 2 lbs per week til next visit. 10. Use only Mrs. Dash and no salt or salt substitutes.

## 2014-10-31 ENCOUNTER — Encounter: Payer: Self-pay | Admitting: Gastroenterology

## 2014-10-31 ENCOUNTER — Telehealth: Payer: Self-pay | Admitting: Gastroenterology

## 2014-10-31 NOTE — Telephone Encounter (Signed)
Pt is on the July recall list, SF wants him to have a repeat in 3 months, but patient said he will be away the whole month of July. I scheduled him an OV for 8/11 at 9 with SF and appt card mailed.

## 2014-11-01 ENCOUNTER — Other Ambulatory Visit: Payer: Self-pay | Admitting: Cardiology

## 2014-11-01 NOTE — Telephone Encounter (Signed)
noted 

## 2014-11-06 ENCOUNTER — Other Ambulatory Visit (HOSPITAL_COMMUNITY): Payer: Self-pay | Admitting: Oncology

## 2014-11-06 ENCOUNTER — Other Ambulatory Visit: Payer: Self-pay | Admitting: Nurse Practitioner

## 2014-11-06 DIAGNOSIS — R609 Edema, unspecified: Secondary | ICD-10-CM

## 2014-11-06 MED ORDER — SPIRONOLACTONE 50 MG PO TABS: 50.0000 mg | ORAL_TABLET | Freq: Every day | ORAL | Status: DC

## 2014-11-13 ENCOUNTER — Other Ambulatory Visit: Payer: Self-pay

## 2014-12-05 ENCOUNTER — Other Ambulatory Visit: Payer: Self-pay | Admitting: Nurse Practitioner

## 2014-12-18 DIAGNOSIS — Z9889 Other specified postprocedural states: Secondary | ICD-10-CM | POA: Insufficient documentation

## 2014-12-22 ENCOUNTER — Telehealth: Payer: Self-pay | Admitting: Nutrition

## 2014-12-22 ENCOUNTER — Ambulatory Visit: Payer: Self-pay | Admitting: Nutrition

## 2014-12-22 NOTE — Telephone Encounter (Signed)
No answer and couldn't leave message to call for missed appt. PC

## 2014-12-28 ENCOUNTER — Ambulatory Visit (INDEPENDENT_AMBULATORY_CARE_PROVIDER_SITE_OTHER): Payer: BC Managed Care – PPO | Admitting: Gastroenterology

## 2014-12-28 ENCOUNTER — Encounter: Payer: Self-pay | Admitting: Gastroenterology

## 2014-12-28 ENCOUNTER — Other Ambulatory Visit: Payer: Self-pay

## 2014-12-28 VITALS — BP 139/74 | HR 77 | Temp 97.3°F | Ht 75.0 in | Wt 308.8 lb

## 2014-12-28 DIAGNOSIS — K259 Gastric ulcer, unspecified as acute or chronic, without hemorrhage or perforation: Secondary | ICD-10-CM

## 2014-12-28 DIAGNOSIS — K760 Fatty (change of) liver, not elsewhere classified: Secondary | ICD-10-CM | POA: Diagnosis not present

## 2014-12-28 DIAGNOSIS — R1013 Epigastric pain: Secondary | ICD-10-CM | POA: Diagnosis not present

## 2014-12-28 DIAGNOSIS — Z1211 Encounter for screening for malignant neoplasm of colon: Secondary | ICD-10-CM

## 2014-12-28 NOTE — Assessment & Plan Note (Signed)
SYMPTOMS CONTROLLED/RESOLVED.  CONTINUE TO MONITOR SYMPTOMS. 

## 2014-12-28 NOTE — Assessment & Plan Note (Addendum)
SYMPTOMS CONTROLLED/RESOLVED.  REPEAT EGD FRI SEP 2 W/ MAC DUE TO POLYPHARMACY. DISCUSSED PROCEDURE, BENEFITS, & RISKS: < 1% chance of medication reaction, PERFORATION, OR bleeding.  FOLLOW UP IN 1 YEAR.

## 2014-12-28 NOTE — Progress Notes (Signed)
Subjective:    Patient [name], male    DOB: [birth year], 57 y.o.   MRN: [id number]  Alonza Bogus, MD  HPI Had couple episode of vomiting after corn/shrimp and something [place]. [place] HAPPENED A MO OR TWO AGO. BOWELS MOVING FINE. USUALLY #4. A LITTLE BIT OF HEARTBURN WITH STUFFED PEPPERS. VICTOZA CLEANS HIM OUT AND DECREASES APPETITE. LOST 33 LBS SINCE MAR 2016.  PT DENIES FEVER, CHILLS, HEMATOCHEZIA, HEMATEMESIS, melena, diarrhea, CHEST PAIN, SHORTNESS OF BREATH,  CHANGE IN BOWEL IN HABITS, constipation, abdominal pain, OR problems swallowing.  Past Medical History  Diagnosis Date  . Type 2 diabetes mellitus   . Coronary atherosclerosis of native coronary artery     BMS LAD and PTCA RCA 2000,BMS RCA/PLA 2001  . MI (myocardial infarction) 2001  . Hyperlipidemia   . GERD (gastroesophageal reflux disease)   . Low-grade NHL (non-Hodgkin's lymphoma)   . Lupus anticoagulant positive   . Gastric ulcer   . Diabetes mellitus   . HTN (hypertension)   . Anxiety   . Arthritis     Past Medical History  Diagnosis Date  . Type 2 diabetes mellitus   . Coronary atherosclerosis of native coronary artery     BMS LAD and PTCA RCA 2000,BMS RCA/PLA 2001  . MI (myocardial infarction) 2001  . Hyperlipidemia   . GERD (gastroesophageal reflux disease)   . Low-grade NHL (non-Hodgkin's lymphoma)   . Lupus anticoagulant positive   . Gastric ulcer   . Diabetes mellitus   . HTN (hypertension)   . Anxiety   . Arthritis     No Known Allergies  Current Outpatient Prescriptions  Medication Sig Dispense Refill  . aspirin 81 MG chewable tablet Chew 81 mg by mouth daily.      Marland Kitchen FLUoxetine (PROZAC) 20 MG capsule Take 1 capsule (20 mg total) by mouth daily. (Patient taking differently: Take 20 mg by mouth 2 (two) times daily. )    . insulin aspart (NOVOLOG) 100 UNIT/ML injection Inject into the skin 3 (three) times daily before meals.    . insulin glargine (LANTUS) 100  UNIT/ML injection Inject 65 Units into the skin 2 (two) times daily. Pt is adjusting his own doses and varies    . Liraglutide (VICTOZA [place]) Inject 1.8 Units into the skin daily.    Marland Kitchen lisinopril (PRINIVIL,ZESTRIL) 20 MG tablet Take 20 mg by mouth daily. Unknown dosage    . losartan (COZAAR) 100 MG tablet Take 100 mg by mouth daily.    .      . metFORMIN (GLUCOPHAGE) 500 MG tablet Take 1,000 mg by mouth 2 (two) times daily with a meal.      . metoprolol (LOPRESSOR) 50 MG tablet Take 25 mg by mouth 2 (two) times daily.      . nitroGLYCERIN (NITROSTAT) 0.4 MG SL tablet Place 1 tablet (0.4 mg total) under the tongue every 5 (five) minutes as needed.    Marland Kitchen oxyCODONE-acetaminophen (PERCOCET) 10-325 MG per tablet Take 1 tablet by mouth every 4 (four) hours as needed for pain. RARE   . pantoprazole (PROTONIX) 40 MG tablet Take 40 mg by mouth daily.    . simvastatin (ZOCOR) 10 MG tablet TAKE 1 TABLET BY MOUTH EVERY NIGHT AT BEDTIME    . spironolactone (ALDACTONE) 50 MG tablet Take 1 tablet (50 mg total) by mouth daily.    Marland Kitchen zolpidem (AMBIEN) 10 MG tablet Take 10 mg by mouth at bedtime as needed for  sleep.    .      . isosorbide mononitrate (IMDUR) 30 MG 24 hr tablet TAKE 1 TABLET BY MOUTH EVERY DAY (Patient not taking: Reported on 12/28/2014)    .      Marland Kitchen        Review of Systems PER HPI OTHERWISE ALL SYSTEMS ARE NEGATIVE.     Objective:   Physical Exam  Constitutional: He is oriented to person, place, and time. He appears well-developed and well-nourished. No distress.  HENT:  Head: Normocephalic and atraumatic.  Mouth/Throat: Oropharynx is clear and moist. No oropharyngeal exudate.  Eyes: Pupils are equal, round, and reactive to light. No scleral icterus.  Neck: Normal range of motion. Neck supple.  Cardiovascular: Normal rate, regular rhythm and normal heart sounds.   Pulmonary/Chest: Effort normal and breath sounds normal. No respiratory distress.  Abdominal: Soft. Bowel sounds are normal. He  exhibits no distension. There is no tenderness.  Musculoskeletal: He exhibits no edema.  Lymphadenopathy:    He has no cervical adenopathy.  Neurological: He is alert and oriented to person, place, and time.  NO FOCAL DEFICITS   Psychiatric: He has a normal mood and affect.  Vitals reviewed.         Assessment & Plan:

## 2014-12-28 NOTE — Patient Instructions (Signed)
CONTINUE YOUR WEIGHT LOSS EFFORTS.  DRINK WATER TO KEEP YOUR URINE LIGHT YELLOW.  FOLLOW A DIABETIC/HIGH FIBER DIET. AVOID ITEMS THAT CAUSE BLOATING & GAS. SEE INFO BELOW.  UPPER ENDOSCOPY TO MAKE SURE YOUR ULCERS ARE HEALED ON FRI SEP 2.   NEXT COLONOSCOPY IN 2018.  High-Fiber Diet A high-fiber diet changes your normal diet to include more whole grains, legumes, fruits, and vegetables. Changes in the diet involve replacing refined carbohydrates with unrefined foods. The calorie level of the diet is essentially unchanged. The Dietary Reference Intake (recommended amount) for adult males is 38 grams per day. For adult females, it is 25 grams per day. Pregnant and lactating women should consume 28 grams of fiber per day. Fiber is the intact part of a plant that is not broken down during digestion. Functional fiber is fiber that has been isolated from the plant to provide a beneficial effect in the body. PURPOSE  Increase stool bulk.   Ease and regulate bowel movements.   Lower cholesterol.  REDUCE RISK OF COLON CANCER  INDICATIONS THAT YOU NEED MORE FIBER  Constipation and hemorrhoids.   Uncomplicated diverticulosis (intestine condition) and irritable bowel syndrome.   Weight management.   As a protective measure against hardening of the arteries (atherosclerosis), diabetes, and cancer.   GUIDELINES FOR INCREASING FIBER IN THE DIET  Start adding fiber to the diet slowly. A gradual increase of about 5 more grams (2 slices of whole-wheat bread, 2 servings of most fruits or vegetables, or 1 bowl of high-fiber cereal) per day is best. Too rapid an increase in fiber may result in constipation, flatulence, and bloating.   Drink enough water and fluids to keep your urine clear or pale yellow. Water, juice, or caffeine-free drinks are recommended. Not drinking enough fluid may cause constipation.   Eat a variety of high-fiber foods rather than one type of fiber.   Try to increase your  intake of fiber through using high-fiber foods rather than fiber pills or supplements that contain small amounts of fiber.   The goal is to change the types of food eaten. Do not supplement your present diet with high-fiber foods, but replace foods in your present diet.  INCLUDE A VARIETY OF FIBER SOURCES  Replace refined and processed grains with whole grains, canned fruits with fresh fruits, and incorporate other fiber sources. White rice, white breads, and most bakery goods contain little or no fiber.   Brown whole-grain rice, buckwheat oats, and many fruits and vegetables are all good sources of fiber. These include: broccoli, Brussels sprouts, cabbage, cauliflower, beets, sweet potatoes, white potatoes (skin on), carrots, tomatoes, eggplant, squash, berries, fresh fruits, and dried fruits.   Cereals appear to be the richest source of fiber. Cereal fiber is found in whole grains and bran. Bran is the fiber-rich outer coat of cereal grain, which is largely removed in refining. In whole-grain cereals, the bran remains. In breakfast cereals, the largest amount of fiber is found in those with "bran" in their names. The fiber content is sometimes indicated on the label.   You may need to include additional fruits and vegetables each day.   In baking, for 1 cup white flour, you may use the following substitutions:   1 cup whole-wheat flour minus 2 tablespoons.   1/2 cup white flour plus 1/2 cup whole-wheat flour.

## 2014-12-28 NOTE — Assessment & Plan Note (Signed)
NO WARNING SIGNS/SYMPTOMS  REVIEWED 2008 TCS REPORT WITH PT DISCUSSED PROCEDURE, AND SCREENING GUIDELINES FOR COLONOSCOPY. NEXT TCS IN 2018

## 2014-12-28 NOTE — Progress Notes (Signed)
cc'ed to pcp °

## 2014-12-28 NOTE — Progress Notes (Signed)
ON RECALL LIST  °

## 2014-12-28 NOTE — Assessment & Plan Note (Addendum)
WEIGHT LOSS INTENTIONAL.  CONTINUE TO MONITOR SYMPTOMS. DISCUSSED THE NATURE OF FATTY LIVER DISEASE AND NEED TO MONITOR LIVER PANEL ONCE A YEAR.  DISCUSSED BENEFITS OF CONTINUE WEIGHT LOSS AND REDUCING RISK OF CIRRHOSIS.PT AND HIS WIFE VOICED UNDERSTANDING.  CONTINUE YOUR WEIGHT LOSS EFFORTS. CMP WITH PREOP LABS. FOLLOW UP IN 1 YEAR.   GREATER THAN 50% WAS SPENT IN COUNSELING & COORDINATION OF CARE WITH THE PATIENT: DISCUSSED DIFFERENTIAL DIAGNOSIS, PROCEDURE, BENEFITS, RISKS, AND MANAGEMENT OF FATTY LIVER AND ULCERS/UPPER ENDOSCOPY DISEASE. TOTAL ENCOUNTER TIME: 15 MINS.

## 2014-12-28 NOTE — Addendum Note (Signed)
Addended by: Danie Binder on: 12/28/2014 09:32 AM   Modules accepted: Level of Service

## 2015-01-16 ENCOUNTER — Encounter (HOSPITAL_COMMUNITY)
Admission: RE | Admit: 2015-01-16 | Discharge: 2015-01-16 | Disposition: A | Discharge status: Home / Self Care | Payer: BC Managed Care – PPO | Source: Ambulatory Visit | Attending: Gastroenterology | Admitting: Gastroenterology

## 2015-01-16 ENCOUNTER — Encounter (HOSPITAL_COMMUNITY): Payer: Self-pay

## 2015-01-16 DIAGNOSIS — E785 Hyperlipidemia, unspecified: Secondary | ICD-10-CM | POA: Diagnosis not present

## 2015-01-16 DIAGNOSIS — E119 Type 2 diabetes mellitus without complications: Secondary | ICD-10-CM | POA: Diagnosis not present

## 2015-01-16 DIAGNOSIS — F419 Anxiety disorder, unspecified: Secondary | ICD-10-CM | POA: Diagnosis not present

## 2015-01-16 DIAGNOSIS — Z7982 Long term (current) use of aspirin: Secondary | ICD-10-CM | POA: Diagnosis not present

## 2015-01-16 DIAGNOSIS — K317 Polyp of stomach and duodenum: Secondary | ICD-10-CM | POA: Diagnosis not present

## 2015-01-16 DIAGNOSIS — K259 Gastric ulcer, unspecified as acute or chronic, without hemorrhage or perforation: Secondary | ICD-10-CM | POA: Diagnosis not present

## 2015-01-16 DIAGNOSIS — Z87891 Personal history of nicotine dependence: Secondary | ICD-10-CM | POA: Diagnosis not present

## 2015-01-16 DIAGNOSIS — I2510 Atherosclerotic heart disease of native coronary artery without angina pectoris: Secondary | ICD-10-CM | POA: Diagnosis not present

## 2015-01-16 DIAGNOSIS — Z79899 Other long term (current) drug therapy: Secondary | ICD-10-CM | POA: Diagnosis not present

## 2015-01-16 DIAGNOSIS — I10 Essential (primary) hypertension: Secondary | ICD-10-CM | POA: Diagnosis not present

## 2015-01-16 DIAGNOSIS — Z794 Long term (current) use of insulin: Secondary | ICD-10-CM | POA: Diagnosis not present

## 2015-01-16 DIAGNOSIS — K2950 Unspecified chronic gastritis without bleeding: Secondary | ICD-10-CM | POA: Diagnosis not present

## 2015-01-16 DIAGNOSIS — Z8572 Personal history of non-Hodgkin lymphomas: Secondary | ICD-10-CM | POA: Diagnosis not present

## 2015-01-16 LAB — COMPREHENSIVE METABOLIC PANEL
ALT: 22 U/L (ref 17–63)
ANION GAP: 10 (ref 5–15)
AST: 27 U/L (ref 15–41)
Albumin: 3.8 g/dL (ref 3.5–5.0)
Alkaline Phosphatase: 50 U/L (ref 38–126)
BILIRUBIN TOTAL: 0.9 mg/dL (ref 0.3–1.2)
BUN: 22 mg/dL — ABNORMAL HIGH (ref 6–20)
CHLORIDE: 102 mmol/L (ref 101–111)
CO2: 24 mmol/L (ref 22–32)
Calcium: 9.2 mg/dL (ref 8.9–10.3)
Creatinine, Ser: 1.14 mg/dL (ref 0.61–1.24)
GFR calc Af Amer: >60 mL/min (ref >60)
GFR calc non Af Amer: >60 mL/min (ref >60)
GLUCOSE: 191 mg/dL — AB (ref 65–99)
Potassium: 4.1 mmol/L (ref 3.5–5.1)
Sodium: 136 mmol/L (ref 135–145)
TOTAL PROTEIN: 7.2 g/dL (ref 6.5–8.1)

## 2015-01-16 LAB — CBC WITH DIFFERENTIAL/PLATELET
BASOS ABS: 0 10*3/uL (ref 0.0–0.1)
Basophils Relative: 0 % (ref 0–1)
EOS ABS: 0.3 10*3/uL (ref 0.0–0.7)
EOS PCT: 3 % (ref 0–5)
HEMATOCRIT: 38.3 % — AB (ref 39.0–52.0)
Hemoglobin: 13.3 g/dL (ref 13.0–17.0)
Lymphocytes Relative: 18 % (ref 12–46)
Lymphs Abs: 1.6 10*3/uL (ref 0.7–4.0)
MCH: 33.5 pg (ref 26.0–34.0)
MCHC: 34.7 g/dL (ref 30.0–36.0)
MCV: 96.5 fL (ref 78.0–100.0)
MONO ABS: 0.8 10*3/uL (ref 0.1–1.0)
MONOS PCT: 9 % (ref 3–12)
NEUTROS ABS: 6.3 10*3/uL (ref 1.7–7.7)
Neutrophils Relative %: 70 % (ref 43–77)
Platelets: 180 10*3/uL (ref 150–400)
RBC: 3.97 MIL/uL — ABNORMAL LOW (ref 4.22–5.81)
RDW: 12.3 % (ref 11.5–15.5)
WBC: 8.9 10*3/uL (ref 4.0–10.5)

## 2015-01-16 NOTE — Patient Instructions (Signed)
BRODE SCULLEY  01/16/2015     @PREFPERIOPPHARMACY @   Your procedure is scheduled on  01/19/2015   Report to [facility] at  [place]  A.M.  Call this number if you have problems the morning of surgery:  [phone]   Remember:  Do not eat food or drink liquids after midnight.  Take these medicines the morning of surgery with A SIP OF WATER  Prozac, imdur, lisinopril, losartan, mobic, metoprolol, prilosec, oxycodone.   Do not wear jewelry, make-up or nail polish.  Do not wear lotions, powders, or perfumes.    Do not shave 48 hours prior to surgery.  Men may shave face and neck.  Do not bring valuables to the hospital.  [facility] is not responsible for any belongings or valuables.  Contacts, dentures or bridgework may not be worn into surgery.  Leave your suitcase in the car.  After surgery it may be brought to your room.  For patients admitted to the hospital, discharge time will be determined by your treatment team.  Patients discharged the day of surgery will not be allowed to drive home.   Name and phone number of your driver:   family Special instructions:  Follow your diet instructions given to you by Dr Oneida Alar office.  Please read over the following fact sheets that you were given. Pain Booklet, Coughing and Deep Breathing, Surgical Site Infection Prevention, Anesthesia Post-op Instructions and Care and Recovery After Surgery      Esophagogastroduodenoscopy Esophagogastroduodenoscopy (EGD) is a procedure to examine the lining of the esophagus, stomach, and first part of the small intestine (duodenum). A long, flexible, lighted tube with a camera attached (endoscope) is inserted down the throat to view these organs. This procedure is done to detect problems or abnormalities, such as inflammation, bleeding, ulcers, or growths, in order to treat them. The procedure lasts about 5-20 minutes. It is usually an outpatient procedure, but it may need to be performed in  emergency cases in the hospital. LET YOUR CAREGIVER KNOW ABOUT:   Allergies to food or medicine.  All medicines you are taking, including vitamins, herbs, eyedrops, and over-the-counter medicines and creams.  Use of steroids (by mouth or creams).  Previous problems you or members of your family have had with the use of anesthetics.  Any blood disorders you have.  Previous surgeries you have had.  Other health problems you have.  Possibility of pregnancy, if this applies. RISKS AND COMPLICATIONS  Generally, EGD is a safe procedure. However, as with any procedure, complications can occur. Possible complications include:  Infection.  Bleeding.  Tearing (perforation) of the esophagus, stomach, or duodenum.  Difficulty breathing or not being able to breath.  Excessive sweating.  Spasms of the larynx.  Slowed heartbeat.  Low blood pressure. BEFORE THE PROCEDURE  Do not eat or drink anything for 6-8 hours before the procedure or as directed by your caregiver.  Ask your caregiver about changing or stopping your regular medicines.  If you wear dentures, be prepared to remove them before the procedure.  Arrange for someone to drive you home after the procedure. PROCEDURE   A vein will be accessed to give medicines and fluids. A medicine to relax you (sedative) and a pain reliever will be given through that access into the vein.  A numbing medicine (local anesthetic) may be sprayed on your throat for comfort and to stop you from gagging or coughing.  A mouth guard may be placed  in your mouth to protect your teeth and to keep you from biting on the endoscope.  You will be asked to lie on your left side.  The endoscope is inserted down your throat and into the esophagus, stomach, and duodenum.  Air is put through the endoscope to allow your caregiver to view the lining of your esophagus clearly.  The esophagus, stomach, and duodenum is then examined. During the exam,  your caregiver may:  Remove tissue to be examined under a microscope (biopsy) for inflammation, infection, or other medical problems.  Remove growths.  Remove objects (foreign bodies) that are stuck.  Treat any bleeding with medicines or other devices that stop tissues from bleeding (hot cautery, clipping devices).  Widen (dilate) or stretch narrowed areas of the esophagus and stomach.  The endoscope will then be withdrawn. AFTER THE PROCEDURE  You will be taken to a recovery area to be monitored. You will be able to go home once you are stable and alert.  Do not eat or drink anything until the local anesthetic and numbing medicines have worn off. You may choke.  It is normal to feel bloated, have pain with swallowing, or have a sore throat for a short time. This will wear off.  Your caregiver should be able to discuss his or her findings with you. It will take longer to discuss the test results if any biopsies were taken. Document Released: 09/05/2004 Document Revised: 09/19/2013 Document Reviewed: 04/07/2012 [facility] Patient Information 2015 [place], [place]. This information is not intended to replace advice given to you by your health care provider. Make sure you discuss any questions you have with your health care provider. PATIENT INSTRUCTIONS POST-ANESTHESIA  IMMEDIATELY FOLLOWING SURGERY:  Do not drive or operate machinery for the first twenty four hours after surgery.  Do not make any important decisions for twenty four hours after surgery or while taking narcotic pain medications or sedatives.  If you develop intractable nausea and vomiting or a severe headache please notify your doctor immediately.  FOLLOW-UP:  Please make an appointment with your surgeon as instructed. You do not need to follow up with anesthesia unless specifically instructed to do so.  WOUND CARE INSTRUCTIONS (if applicable):  Keep a dry clean dressing on the anesthesia/puncture wound site if there is  drainage.  Once the wound has quit draining you may leave it open to air.  Generally you should leave the bandage intact for twenty four hours unless there is drainage.  If the epidural site drains for more than 36-48 hours please call the anesthesia department.  QUESTIONS?:  Please feel free to call your physician or the hospital operator if you have any questions, and they will be happy to assist you.

## 2015-01-17 ENCOUNTER — Other Ambulatory Visit (HOSPITAL_COMMUNITY): Payer: Self-pay

## 2015-01-19 ENCOUNTER — Ambulatory Visit (HOSPITAL_COMMUNITY)
Admission: RE | Admit: 2015-01-19 | Discharge: 2015-01-19 | Disposition: A | Discharge status: Home / Self Care | Payer: BC Managed Care – PPO | Source: Ambulatory Visit | Attending: Gastroenterology | Admitting: Gastroenterology

## 2015-01-19 ENCOUNTER — Ambulatory Visit (HOSPITAL_COMMUNITY): Payer: BC Managed Care – PPO | Admitting: Anesthesiology

## 2015-01-19 ENCOUNTER — Encounter (HOSPITAL_COMMUNITY)
Admission: RE | Disposition: A | Discharge status: Home / Self Care | Payer: Self-pay | Source: Ambulatory Visit | Attending: Gastroenterology

## 2015-01-19 ENCOUNTER — Encounter (HOSPITAL_COMMUNITY): Payer: Self-pay | Admitting: *Deleted

## 2015-01-19 DIAGNOSIS — K259 Gastric ulcer, unspecified as acute or chronic, without hemorrhage or perforation: Secondary | ICD-10-CM | POA: Diagnosis not present

## 2015-01-19 DIAGNOSIS — K2950 Unspecified chronic gastritis without bleeding: Secondary | ICD-10-CM | POA: Insufficient documentation

## 2015-01-19 DIAGNOSIS — E119 Type 2 diabetes mellitus without complications: Secondary | ICD-10-CM | POA: Insufficient documentation

## 2015-01-19 DIAGNOSIS — Z794 Long term (current) use of insulin: Secondary | ICD-10-CM | POA: Insufficient documentation

## 2015-01-19 DIAGNOSIS — I2510 Atherosclerotic heart disease of native coronary artery without angina pectoris: Secondary | ICD-10-CM | POA: Insufficient documentation

## 2015-01-19 DIAGNOSIS — K253 Acute gastric ulcer without hemorrhage or perforation: Secondary | ICD-10-CM | POA: Diagnosis not present

## 2015-01-19 DIAGNOSIS — Z87891 Personal history of nicotine dependence: Secondary | ICD-10-CM | POA: Insufficient documentation

## 2015-01-19 DIAGNOSIS — K317 Polyp of stomach and duodenum: Secondary | ICD-10-CM | POA: Insufficient documentation

## 2015-01-19 DIAGNOSIS — Z7982 Long term (current) use of aspirin: Secondary | ICD-10-CM | POA: Insufficient documentation

## 2015-01-19 DIAGNOSIS — F419 Anxiety disorder, unspecified: Secondary | ICD-10-CM | POA: Insufficient documentation

## 2015-01-19 DIAGNOSIS — I10 Essential (primary) hypertension: Secondary | ICD-10-CM | POA: Insufficient documentation

## 2015-01-19 DIAGNOSIS — Z79899 Other long term (current) drug therapy: Secondary | ICD-10-CM | POA: Insufficient documentation

## 2015-01-19 DIAGNOSIS — Z8572 Personal history of non-Hodgkin lymphomas: Secondary | ICD-10-CM | POA: Insufficient documentation

## 2015-01-19 DIAGNOSIS — E785 Hyperlipidemia, unspecified: Secondary | ICD-10-CM | POA: Insufficient documentation

## 2015-01-19 HISTORY — PX: ESOPHAGOGASTRODUODENOSCOPY (EGD) WITH PROPOFOL: SHX5813

## 2015-01-19 HISTORY — PX: POLYPECTOMY: SHX5525

## 2015-01-19 HISTORY — PX: BIOPSY: SHX5522

## 2015-01-19 LAB — GLUCOSE, CAPILLARY
GLUCOSE-CAPILLARY: 168 mg/dL — AB (ref 65–99)
Glucose-Capillary: 165 mg/dL — ABNORMAL HIGH (ref 65–99)

## 2015-01-19 SURGERY — ESOPHAGOGASTRODUODENOSCOPY (EGD) WITH PROPOFOL
Anesthesia: Monitor Anesthesia Care

## 2015-01-19 MED ORDER — FENTANYL CITRATE (PF) 250 MCG/5ML IJ SOLN
INTRAMUSCULAR | Status: AC
  Filled 2015-01-19: qty 25

## 2015-01-19 MED ORDER — FENTANYL CITRATE (PF) 100 MCG/2ML IJ SOLN
25.0000 ug | INTRAMUSCULAR | Status: AC
  Administered 2015-01-19 (×2): 25 ug via INTRAVENOUS

## 2015-01-19 MED ORDER — FENTANYL CITRATE (PF) 100 MCG/2ML IJ SOLN
INTRAMUSCULAR | Status: AC
  Filled 2015-01-19: qty 2

## 2015-01-19 MED ORDER — LIDOCAINE VISCOUS 2 % MT SOLN
15.0000 mL | Freq: Once | OROMUCOSAL | Status: AC
  Administered 2015-01-19: 15 mL via OROMUCOSAL

## 2015-01-19 MED ORDER — MIDAZOLAM HCL 2 MG/2ML IJ SOLN
INTRAMUSCULAR | Status: AC
  Filled 2015-01-19: qty 2

## 2015-01-19 MED ORDER — ONDANSETRON HCL 4 MG/2ML IJ SOLN: 4.0000 mg | Freq: Once | INTRAMUSCULAR | Status: DC | PRN

## 2015-01-19 MED ORDER — PROPOFOL 10 MG/ML IV BOLUS
INTRAVENOUS | Status: AC
  Filled 2015-01-19: qty 20

## 2015-01-19 MED ORDER — LIDOCAINE VISCOUS 2 % MT SOLN
OROMUCOSAL | Status: AC
  Filled 2015-01-19: qty 15

## 2015-01-19 MED ORDER — LACTATED RINGERS IV SOLN
INTRAVENOUS | Status: DC
  Administered 2015-01-19: 08:00:00 via INTRAVENOUS

## 2015-01-19 MED ORDER — PROPOFOL 10 MG/ML IV BOLUS
INTRAVENOUS | Status: DC | PRN
  Administered 2015-01-19: 10 mg via INTRAVENOUS

## 2015-01-19 MED ORDER — FENTANYL CITRATE (PF) 100 MCG/2ML IJ SOLN: 25.0000 ug | INTRAMUSCULAR | Status: DC | PRN

## 2015-01-19 MED ORDER — MIDAZOLAM HCL 2 MG/2ML IJ SOLN
1.0000 mg | INTRAMUSCULAR | Status: DC | PRN
  Administered 2015-01-19 (×2): 2 mg via INTRAVENOUS
  Filled 2015-01-19: qty 2

## 2015-01-19 MED ORDER — STERILE WATER FOR IRRIGATION IR SOLN
Status: DC | PRN
  Administered 2015-01-19: 1000 mL

## 2015-01-19 MED ORDER — PROPOFOL INFUSION 10 MG/ML OPTIME
INTRAVENOUS | Status: DC | PRN
  Administered 2015-01-19: 100 ug/kg/min via INTRAVENOUS

## 2015-01-19 SURGICAL SUPPLY — 13 items
BLOCK BITE 60FR ADLT L/F BLUE (MISCELLANEOUS) ×2 IMPLANT
ELECT REM PT RETURN 9FT ADLT (ELECTROSURGICAL) ×4
ELECTRODE REM PT RTRN 9FT ADLT (ELECTROSURGICAL) IMPLANT
FLOOR PAD 36X40 (MISCELLANEOUS) ×4
FORCEPS BIOP RAD 4 LRG CAP 4 (CUTTING FORCEPS) ×2 IMPLANT
FORMALIN 10 PREFIL 20ML (MISCELLANEOUS) ×4 IMPLANT
KIT ENDO PROCEDURE PEN (KITS) ×4 IMPLANT
MANIFOLD NEPTUNE II (INSTRUMENTS) ×4 IMPLANT
PAD FLOOR 36X40 (MISCELLANEOUS) IMPLANT
SNARE SHORT THROW 13M SML OVAL (MISCELLANEOUS) ×2 IMPLANT
SYR 50ML LL SCALE MARK (SYRINGE) ×2 IMPLANT
TUBING IRRIGATION ENDOGATOR (MISCELLANEOUS) ×2 IMPLANT
WATER STERILE IRR 1000ML POUR (IV SOLUTION) ×2 IMPLANT

## 2015-01-19 NOTE — Op Note (Signed)
[facility] [address] [place], [address]   ENDOSCOPY PROCEDURE REPORT  PATIENT: Kelly [name], Kelly [name]  MR#: [id number] BIRTHDATE: 1956/02/20 , 7  yrs. old GENDER: male  ENDOSCOPIST: Danie Binder, MD REFERRED YD:XAJOIN Hawkins, M.D.  PROCEDURE DATE: 02-03-15 PROCEDURE:   EGD w/ biopsy and EGD w/ snare technique  INDICATIONS:PT had antral ulcer SEEN DURING EGD MAR 2016.Marland Kitchen MEDICATIONS: Monitored anesthesia care TOPICAL ANESTHETIC:   Viscous Xylocaine ASA CLASS:  DESCRIPTION OF PROCEDURE:     Physical exam was performed.  Informed consent was obtained from the patient after explaining the benefits, risks, and alternatives to the procedure.  The patient was connected to the monitor and placed in the left lateral position.  Continuous oxygen was provided by nasal cannula and IV medicine administered through an indwelling cannula.  After administration of sedation, the patients esophagus was intubated and the     endoscope was advanced under direct visualization to the second portion of the duodenum.  The scope was removed slowly by carefully examining the color, texture, anatomy, and integrity of the mucosa on the way out.  The patient was recovered in endoscopy and discharged home in satisfactory condition.  Estimated blood loss is zero unless otherwise noted in this procedure report.     ESOPHAGUS: The mucosa of the esophagus appeared normal.   STOMACH: A medium sized non-bleeding, clean-based and irregular shaped ulcer with heaped up edges was found in the gastric antrum.  Biopsies were taken around the ulcer.   A single polyp measuring 9 mm in size was found in the gastric antrum.  A polypectomy was performed with snare cautery.   DUODENUM: The duodenal mucosa showed no abnormalities in the bulb and 2nd part of the duodenum. COMPLICATIONS: There were no immediate complications.  ENDOSCOPIC IMPRESSION: 1.   PERSISTENT ULCER WITH FIRM BASE 2.  Single  GASTRIC polyp found in the gastric antrum ULCER BASE  RECOMMENDATIONS: FOLLOW A LOW FAT DIET. PROTONIX 30 MINUTES PRIOR TO YOUR FIRST MEAL. STOP OMEPRAZOLE. AWAIT BIOPSY. CONSIDER EUS IF BIOPSIES ARE NONDIAGNOSTIC. FOLLOW UP IN 4 MOS.  REPEAT EXAM:  eSigned:  Danie Binder, MD Feb 03, 2015 8:11 PM  CPT CODES: ICD CODES:  The ICD and CPT codes recommended by this software are interpretations from the data that the clinical staff has captured with the software.  The verification of the translation of this report to the ICD and CPT codes and modifiers is the sole responsibility of the health care institution and practicing physician where this report was generated.  [employer] [place] will not be held responsible for the validity of the ICD and CPT codes included on this report.  AMA assumes no liability for data contained or not contained herein. CPT is a Designer, television/film set of the [employer].

## 2015-01-19 NOTE — H&P (Signed)
Primary Care Physician:  Alonza Bogus, MD Primary Gastroenterologist:  Dr. Oneida Alar  Pre-Procedure History & Physical: HPI:  Kelly [name] is a 59 y.o. male here for PUD ON EGD [facility] 2016.  Past Medical History  Diagnosis Date  . Type 2 diabetes mellitus   . Coronary atherosclerosis of native coronary artery     BMS LAD and PTCA RCA 2000,BMS RCA/PLA 2001  . Hyperlipidemia   . GERD (gastroesophageal reflux disease)   . Lupus anticoagulant positive   . Gastric ulcer   . Diabetes mellitus   . HTN (hypertension)   . Anxiety   . Arthritis   . MI (myocardial infarction) 2001  . Low-grade NHL (non-Hodgkin's lymphoma)     Past Surgical History  Procedure Laterality Date  . Inguinal lymph node biopsy      right inguinal lymph node biopsy  . Back surgery      multiple  . Neck surgery    . Axillary abscess      left incision and drainage left  . Dental extractions    . Colonoscopy  June 2008    Dr. Dellis Filbert Medoff: Mild left colonic diverticulosis  . Esophagogastroduodenoscopy  June 2008    Dr. Dellis Filbert Medoff: Gastric ulcer, antral, biopsy with reactive changes associated glandular atrophy, no H pylori. No features of lymphoma. Likely NSAID related  . Shoulder arthroscopy Left   . Cardiac stents      x5 stents  . Esophagogastroduodenoscopy  August 2008    Dr. Richmond Campbell: Complete healing of gastric ulcers.  . Esophagogastroduodenoscopy (egd) with propofol N/A 08/15/2014    SLF: 1. Abnormal pain due to ulcers & gastriitis   . Esophageal biopsy  08/15/2014    Procedure: BIOPSY;  Surgeon: Danie Binder, MD;  Location: [facility];  Service: Endoscopy;;  Gastric    Prior to Admission medications   Medication Sig Start Date End Date Taking? Authorizing Provider  aspirin 81 MG chewable tablet Chew 81 mg by mouth daily.     Yes Historical Provider, MD  FLUoxetine (PROZAC) 20 MG capsule Take 1 capsule (20 mg total) by mouth daily. Patient taking differently: Take 20 mg by mouth 2  (two) times daily.  07/28/12  Yes Manon Hilding Kefalas, PA-C  glyBURIDE (DIABETA) 5 MG tablet Take 10 mg by mouth 2 (two) times daily.     Yes Historical Provider, MD  insulin aspart (NOVOLOG) 100 UNIT/ML injection Inject into the skin 3 (three) times daily before meals. 08/10/14  Yes Historical Provider, MD  insulin glargine (LANTUS) 100 UNIT/ML injection Inject 65 Units into the skin 2 (two) times daily. Pt is adjusting his own doses and varies   Yes Historical Provider, MD  isosorbide mononitrate (IMDUR) 30 MG 24 hr tablet TAKE 1 TABLET BY MOUTH EVERY DAY 11/01/14  Yes Satira Sark, MD  Liraglutide (VICTOZA [place]) Inject 1.8 Units into the skin daily.   Yes Historical Provider, MD  lisinopril (PRINIVIL,ZESTRIL) 20 MG tablet Take 20 mg by mouth daily. Unknown dosage   Yes Historical Provider, MD  losartan (COZAAR) 100 MG tablet Take 100 mg by mouth daily.   Yes Historical Provider, MD  meloxicam (MOBIC) 7.5 MG tablet TAKE 1 TABLET BY MOUTH DAILY 11/06/14  Yes Carlis Stable, NP  metFORMIN (GLUCOPHAGE) 500 MG tablet Take 1,000 mg by mouth 2 (two) times daily with a meal.     Yes Historical Provider, MD  metoprolol (LOPRESSOR) 50 MG tablet Take 25 mg by mouth 2 (two) times  daily.     Yes Historical Provider, MD  omeprazole (PRILOSEC) 20 MG capsule Take 20 mg by mouth daily.   Yes Historical Provider, MD  oxyCODONE-acetaminophen (PERCOCET) 10-325 MG per tablet Take 1 tablet by mouth every 4 (four) hours as needed for pain.   Yes Historical Provider, MD  pantoprazole (PROTONIX) 40 MG tablet Take 40 mg by mouth daily.   Yes Historical Provider, MD  pioglitazone (ACTOS) 15 MG tablet Take 15 mg by mouth daily.     Yes Historical Provider, MD  simvastatin (ZOCOR) 10 MG tablet TAKE 1 TABLET BY MOUTH EVERY NIGHT AT BEDTIME 04/29/12  Yes Satira Sark, MD  spironolactone (ALDACTONE) 50 MG tablet Take 1 tablet (50 mg total) by mouth daily. 11/06/14  Yes Manon Hilding Kefalas, PA-C  zolpidem (AMBIEN) 10 MG tablet Take 10  mg by mouth at bedtime as needed for sleep.   Yes Historical Provider, MD  nitroGLYCERIN (NITROSTAT) 0.4 MG SL tablet Place 1 tablet (0.4 mg total) under the tongue every 5 (five) minutes as needed. 05/05/14   Satira Sark, MD    Allergies as of 12/28/2014  . (No Known Allergies)    Family History  Problem Relation Age of Onset  . Pancreatic cancer Maternal Grandmother   . Colon cancer Neg Hx     Social History   Social History  . Marital Status: Married    Spouse Name: N/A  . Number of Children: N/A  . Years of Education: N/A   Occupational History  . Not on file.   Social History Main Topics  . Smoking status: Former Smoker -- 1.00 packs/day for 23 years    Types: Cigarettes    Start date: 10/30/1970    Quit date: 10/29/1993  . Smokeless tobacco: Former Systems developer    Types: Snuff, Chew  . Alcohol Use: No  . Drug Use: No  . Sexual Activity: No   Other Topics Concern  . Not on file   Social History Narrative    Review of Systems: See HPI, otherwise negative ROS   Physical Exam: BP 99/59 mmHg  Temp(Src) 98 F (36.7 C) (Oral)  Resp 17  SpO2 100% General:   Alert,  pleasant and cooperative in NAD Head:  Normocephalic and atraumatic. Neck:  Supple; Lungs:  Clear throughout to auscultation.    Heart:  Regular rate and rhythm. Abdomen:  Soft, nontender and nondistended. Normal bowel sounds, without guarding, and without rebound.   Neurologic:  Alert and  oriented x4;  grossly normal neurologically.  Impression/Plan:   PUD  PLAN:  REPEAT EGD TO CONFIRM ULCERS ARE HEALED.

## 2015-01-19 NOTE — Anesthesia Preprocedure Evaluation (Signed)
Anesthesia Evaluation  Patient identified by MRN, date of birth, ID band Patient awake    Reviewed: Allergy & Precautions, NPO status , Patient's Chart, lab work & pertinent test results, reviewed documented beta blocker date and time   Airway Mallampati: II  TM Distance: >3 FB     Dental  (+) Edentulous Upper, Edentulous Lower   Pulmonary former smoker,  breath sounds clear to auscultation        Cardiovascular hypertension, Pt. on home beta blockers and Pt. on medications + CAD, + Past MI and + Cardiac Stents Rhythm:Regular Rate:Normal     Neuro/Psych PSYCHIATRIC DISORDERS Anxiety    GI/Hepatic PUD, GERD-  Medicated,  Endo/Other  diabetes, Type 2, Oral Hypoglycemic Agents, Insulin DependentMorbid obesity  Renal/GU      Musculoskeletal   Abdominal   Peds  Hematology  (+) Blood dyscrasia, ,   Anesthesia Other Findings   Reproductive/Obstetrics                             Anesthesia Physical Anesthesia Plan  ASA: III  Anesthesia Plan: MAC   Post-op Pain Management:    Induction: Intravenous  Airway Management Planned: Simple Face Mask  Additional Equipment:   Intra-op Plan:   Post-operative Plan:   Informed Consent: I have reviewed the patients History and Physical, chart, labs and discussed the procedure including the risks, benefits and alternatives for the proposed anesthesia with the patient or authorized representative who has indicated his/her understanding and acceptance.     Plan Discussed with:   Anesthesia Plan Comments:         Anesthesia Quick Evaluation  

## 2015-01-19 NOTE — Transfer of Care (Signed)
Immediate Anesthesia Transfer of Care Note  Patient: Kelly [name]  Procedure(s) Performed: Procedure(s): ESOPHAGOGASTRODUODENOSCOPY (EGD) WITH PROPOFOL (N/A) POLYPECTOMY (GASTRIC) BIOPSY (GASTRIC ULCER)  Patient Location: PACU  Anesthesia Type:MAC  Level of Consciousness: awake and alert   Airway & Oxygen Therapy: Patient Spontanous Breathing and Patient connected to nasal cannula oxygen  Post-op Assessment: Report given to RN  Post vital signs: Reviewed  Last Vitals:  Filed Vitals:   01/19/15 0935  BP:   Pulse: 88  Temp: 36.7 C  Resp: 8    Complications: No apparent anesthesia complications

## 2015-01-19 NOTE — Anesthesia Postprocedure Evaluation (Signed)
  Anesthesia Post-op Note  Patient: Kelly [name]  Procedure(s) Performed: Procedure(s): ESOPHAGOGASTRODUODENOSCOPY (EGD) WITH PROPOFOL (N/A) POLYPECTOMY (GASTRIC) BIOPSY (GASTRIC ULCER)  Patient Location: PACU  Anesthesia Type:MAC  Level of Consciousness: awake, alert  and oriented  Airway and Oxygen Therapy: Patient Spontanous Breathing  Post-op Pain: none  Post-op Assessment: Post-op Vital signs reviewed, Patient's Cardiovascular Status Stable, Respiratory Function Stable, Patent Airway and No signs of Nausea or vomiting              Post-op Vital Signs: Reviewed and stable  Last Vitals:  Filed Vitals:   01/19/15 0935  BP:   Pulse: 88  Temp: 36.7 C  Resp: 8    Complications: No apparent anesthesia complications

## 2015-01-23 ENCOUNTER — Encounter (HOSPITAL_COMMUNITY): Payer: Self-pay | Admitting: Gastroenterology

## 2015-01-29 ENCOUNTER — Other Ambulatory Visit (HOSPITAL_COMMUNITY): Payer: Self-pay | Admitting: Oncology

## 2015-01-29 DIAGNOSIS — R609 Edema, unspecified: Secondary | ICD-10-CM

## 2015-01-29 MED ORDER — SPIRONOLACTONE 50 MG PO TABS: 50.0000 mg | ORAL_TABLET | Freq: Every day | ORAL | Status: DC

## 2015-01-30 ENCOUNTER — Telehealth: Payer: Self-pay | Admitting: Gastroenterology

## 2015-01-30 ENCOUNTER — Other Ambulatory Visit: Payer: Self-pay

## 2015-01-30 DIAGNOSIS — K259 Gastric ulcer, unspecified as acute or chronic, without hemorrhage or perforation: Secondary | ICD-10-CM

## 2015-01-30 NOTE — Discharge Instructions (Signed)
YOU STILL HAVE AN ULCER AND YOU HAD ONE GASTRIC POLYP REMOVED. I BIOPSIED YOUR STOMACH .   FOLLOW A LOW FAT DIET. SEE INFO BELOW.  CONTINUE PROTONIX.  TAKE 30 MINUTES PRIOR TO YOUR FIRST MEAL.  YOU DO NOT NEED PROTONIX AND OMEPRAZOLE.   FOLLOW UP IN 4 MOS.     UPPER ENDOSCOPY AFTER CARE Read the instructions outlined below and refer to this sheet in the next week. These discharge instructions provide you with general information on caring for yourself after you leave the hospital. While your treatment has been planned according to the most current medical practices available, unavoidable complications occasionally occur. If you have any problems or questions after discharge, call DR. Forney Kleinpeter, [phone].  ACTIVITY  You may resume your regular activity, but move at a slower pace for the next 24 hours.   Take frequent rest periods for the next 24 hours.   Walking will help get rid of the air and reduce the bloated feeling in your belly (abdomen).   No driving for 24 hours (because of the medicine (anesthesia) used during the test).   You may shower.   Do not sign any important legal documents or operate any machinery for 24 hours (because of the anesthesia used during the test).    NUTRITION  Drink plenty of fluids.   You may resume your normal diet as instructed by your doctor.   Begin with a light meal and progress to your normal diet. Heavy or fried foods are harder to digest and may make you feel sick to your stomach (nauseated).   Avoid alcoholic beverages for 24 hours or as instructed.    MEDICATIONS  You may resume your normal medications.   WHAT YOU CAN EXPECT TODAY  Some feelings of bloating in the abdomen.   Passage of more gas than usual.    IF YOU HAD A BIOPSY TAKEN DURING THE UPPER ENDOSCOPY:    Eat a soft diet IF YOU HAVE NAUSEA, BLOATING, ABDOMINAL PAIN, OR VOMITING.    FINDING OUT THE RESULTS OF YOUR TEST Not all test results are available  during your visit. DR. Oneida Alar WILL CALL YOU WITHIN 14 DAYS OF YOUR PROCEDUE WITH YOUR RESULTS. Do not assume everything is normal if you have not heard from DR. Kenon Delashmit, CALL HER OFFICE AT [phone].  SEEK IMMEDIATE MEDICAL ATTENTION AND CALL THE OFFICE: [phone] IF:  You have more than a spotting of blood in your stool.   Your belly is swollen (abdominal distention).   You are nauseated or vomiting.   You have a temperature over 101F.   You have abdominal pain or discomfort that is severe or gets worse throughout the day.   Ulcer Disease (Peptic Ulcer, Gastric Ulcer, Duodenal Ulcer) You have an ulcer. This may be in your stomach (gastric ulcer) or in the first part of your small bowel, the duodenum (duodenal ulcer). An ulcer is a break in the lining of the stomach or duodenum. The ulcer causes erosion into the deeper tissue.  CAUSES The stomach has a lining to protect itself from the acid that digests food. The lining can be damaged in two main ways:  The Helico Pylori bacteria (H. Pyolori) can infect the lining of the stomach and cause ulcers.   Nonsteroidal, anti-inflammatory medications (NSAIDS) can cause gastric ulcerations.   Smoking tobacco can increase the acid in the stomach. This can lead to ulcers, and will impair healing of ulcers.   Other factors, such as alcohol use  and stress may contribute to ulcer formation.  SYMPTOMS The problems (symptoms) of ulcer disease are usually a burning or gnawing of the mid-upper belly (abdomen). This is often worse on an empty stomach and may get better with food. This may be associated with feeling sick to your stomach (nausea), bloating, and vomiting.  HOME CARE INSTRUCTIONS Continue regular work and usual activities unless advised otherwise by your caregiver.  Avoid tobacco, alcohol, and caffeine. Tobacco use will decrease and slow the rates of healing.   Avoid foods that seem to aggravate or cause discomfort.    Low-Fat  Diet BREADS, CEREALS, PASTA, RICE, DRIED PEAS, AND BEANS These products are high in carbohydrates and most are low in fat. Therefore, they can be increased in the diet as substitutes for fatty foods. They too, however, contain calories and should not be eaten in excess. Cereals can be eaten for snacks as well as for breakfast.   FRUITS AND VEGETABLES It is good to eat fruits and vegetables. Besides being sources of fiber, both are rich in vitamins and some minerals. They help you get the daily allowances of these nutrients. Fruits and vegetables can be used for snacks and desserts.  MEATS Limit lean meat, chicken, [place], and fish to no more than 6 ounces per day. Beef, Pork, and Lamb Use lean cuts of beef, pork, and lamb. Lean cuts include:  Extra-lean ground beef.  Arm roast.  Sirloin tip.  Center-cut ham.  Round steak.  Loin chops.  Rump roast.  Tenderloin.  Trim all fat off the outside of meats before cooking. It is not necessary to severely decrease the intake of red meat, but lean choices should be made. Lean meat is rich in protein and contains a highly absorbable form of iron. Premenopausal women, in particular, should avoid reducing lean red meat because this could increase the risk for low red blood cells (iron-deficiency anemia).  Chicken and [place] These are good sources of protein. The fat of poultry can be reduced by removing the skin and underlying fat layers before cooking. Chicken and [place] can be substituted for lean red meat in the diet. Poultry should not be fried or covered with high-fat sauces. Fish and Shellfish Fish is a good source of protein. Shellfish contain cholesterol, but they usually are low in saturated fatty acids. The preparation of fish is important. Like chicken and [place], they should not be fried or covered with high-fat sauces. EGGS Egg whites contain no fat or cholesterol. They can be eaten often. Try 1 to 2 egg whites instead of whole eggs in  recipes or use egg substitutes that do not contain yolk. MILK AND DAIRY PRODUCTS Use skim or 1% milk instead of 2% or whole milk. Decrease whole milk, natural, and processed cheeses. Use nonfat or low-fat (2%) cottage cheese or low-fat cheeses made from vegetable oils. Choose nonfat or low-fat (1 to 2%) yogurt. Experiment with evaporated skim milk in recipes that call for heavy cream. Substitute low-fat yogurt or low-fat cottage cheese for sour cream in dips and salad dressings. Have at least 2 servings of low-fat dairy products, such as 2 glasses of skim (or 1%) milk each day to help get your daily calcium intake. FATS AND OILS Reduce the total intake of fats, especially saturated fat. Butterfat, lard, and beef fats are high in saturated fat and cholesterol. These should be avoided as much as possible. Vegetable fats do not contain cholesterol, but certain vegetable fats, such as coconut oil, palm oil,  and palm kernel oil are very high in saturated fats. These should be limited. These fats are often used in bakery goods, processed foods, popcorn, oils, and nondairy creamers. Vegetable shortenings and some peanut butters contain hydrogenated oils, which are also saturated fats. Read the labels on these foods and check for saturated vegetable oils. Unsaturated vegetable oils and fats do not raise blood cholesterol. However, they should be limited because they are fats and are high in calories. Total fat should still be limited to 30% of your daily caloric intake. Desirable liquid vegetable oils are corn oil, cottonseed oil, olive oil, canola oil, safflower oil, soybean oil, and sunflower oil. Peanut oil is not as good, but small amounts are acceptable. Buy a heart-healthy tub margarine that has no partially hydrogenated oils in the ingredients. Mayonnaise and salad dressings often are made from unsaturated fats, but they should also be limited because of their high calorie and fat content. Seeds, nuts, peanut  butter, olives, and avocados are high in fat, but the fat is mainly the unsaturated type. These foods should be limited mainly to avoid excess calories and fat. OTHER EATING TIPS Snacks  Most sweets should be limited as snacks. They tend to be rich in calories and fats, and their caloric content outweighs their nutritional value. Some good choices in snacks are graham crackers, melba toast, soda crackers, bagels (no egg), English muffins, fruits, and vegetables. These snacks are preferable to snack crackers, [place] fries, TORTILLA CHIPS, and POTATO chips. Popcorn should be air-popped or cooked in small amounts of liquid vegetable oil. Desserts Eat fruit, low-fat yogurt, and fruit ices instead of pastries, cake, and cookies. Sherbet, angel food cake, gelatin dessert, frozen low-fat yogurt, or other frozen products that do not contain saturated fat (pure fruit juice bars, frozen ice pops) are also acceptable.  COOKING METHODS Choose those methods that use little or no fat. They include: Poaching.  Braising.  Steaming.  Grilling.  Baking.  Stir-frying.  Broiling.  Microwaving.  Foods can be cooked in a nonstick pan without added fat, or use a nonfat cooking spray in regular cookware. Limit fried foods and avoid frying in saturated fat. Add moisture to lean meats by using water, broth, cooking wines, and other nonfat or low-fat sauces along with the cooking methods mentioned above. Soups and stews should be chilled after cooking. The fat that forms on top after a few hours in the refrigerator should be skimmed off. When preparing meals, avoid using excess salt. Salt can contribute to raising blood pressure in some people.  EATING AWAY FROM HOME Order entres, potatoes, and vegetables without sauces or butter. When meat exceeds the size of a deck of cards (3 to 4 ounces), the rest can be taken home for another meal. Choose vegetable or fruit salads and ask for low-calorie salad dressings to be served  on the side. Use dressings sparingly. Limit high-fat toppings, such as bacon, crumbled eggs, cheese, sunflower seeds, and olives. Ask for heart-healthy tub margarine instead of butter.

## 2015-01-30 NOTE — Telephone Encounter (Signed)
Referral made 

## 2015-01-30 NOTE — Telephone Encounter (Signed)
Called pt and he is aware of results  

## 2015-01-30 NOTE — Telephone Encounter (Signed)
Please call pt. His stomach Bx shows gastritis.    FOLLOW A LOW FAT DIET.   CONTINUE PROTONIX.  TAKE 30 MINUTES PRIOR TO YOUR FIRST MEAL.  YOU DO NOT NEED PROTONIX AND OMEPRAZOLE.  PT NEEDS EUS WITH DR. Paulita Fujita TO COMPLETE EVALUATION OF ULCER BED IN THE STOMACH.   FOLLOW UP IN MAY 2016 E30 PUD/FATTY LIVER.

## 2015-02-09 ENCOUNTER — Encounter: Payer: BC Managed Care – PPO | Attending: "Endocrinology | Admitting: Nutrition

## 2015-02-09 NOTE — Progress Notes (Signed)
He notes he doesn't need to see me. He understands what to do and says his blood sugars are better. A1C 7.6%.

## 2015-02-16 ENCOUNTER — Other Ambulatory Visit: Payer: Self-pay | Admitting: Gastroenterology

## 2015-02-16 NOTE — Addendum Note (Signed)
Addended by: Arta Silence on: 02/16/2015 03:45 PM   Modules accepted: Orders

## 2015-02-19 ENCOUNTER — Encounter (HOSPITAL_COMMUNITY): Payer: Self-pay | Admitting: *Deleted

## 2015-02-21 ENCOUNTER — Ambulatory Visit (HOSPITAL_COMMUNITY)
Admission: RE | Admit: 2015-02-21 | Payer: BC Managed Care – PPO | Source: Ambulatory Visit | Admitting: Gastroenterology

## 2015-02-21 SURGERY — ESOPHAGEAL ENDOSCOPIC ULTRASOUND (EUS) RADIAL
Anesthesia: Monitor Anesthesia Care

## 2015-02-28 ENCOUNTER — Other Ambulatory Visit: Payer: Self-pay | Admitting: Gastroenterology

## 2015-02-28 NOTE — Addendum Note (Signed)
Addended by: Arta Silence on: 02/28/2015 11:39 AM   Modules accepted: Orders

## 2015-03-16 ENCOUNTER — Encounter (HOSPITAL_COMMUNITY): Payer: Self-pay | Admitting: *Deleted

## 2015-03-20 ENCOUNTER — Other Ambulatory Visit: Payer: Self-pay | Admitting: Gastroenterology

## 2015-03-20 NOTE — Addendum Note (Signed)
Addended by: Arta Silence on: 03/20/2015 04:42 PM   Modules accepted: Orders

## 2015-03-21 ENCOUNTER — Ambulatory Visit (HOSPITAL_COMMUNITY): Payer: BC Managed Care – PPO

## 2015-03-21 ENCOUNTER — Ambulatory Visit (HOSPITAL_COMMUNITY): Payer: BC Managed Care – PPO | Admitting: Anesthesiology

## 2015-03-21 ENCOUNTER — Encounter (HOSPITAL_COMMUNITY)
Admission: RE | Disposition: A | Discharge status: Home / Self Care | Payer: Self-pay | Source: Ambulatory Visit | Attending: Gastroenterology

## 2015-03-21 ENCOUNTER — Ambulatory Visit (HOSPITAL_COMMUNITY)
Admission: RE | Admit: 2015-03-21 | Discharge: 2015-03-21 | Disposition: A | Discharge status: Home / Self Care | Payer: BC Managed Care – PPO | Source: Ambulatory Visit | Attending: Gastroenterology | Admitting: Gastroenterology

## 2015-03-21 ENCOUNTER — Other Ambulatory Visit (HOSPITAL_COMMUNITY): Payer: Self-pay

## 2015-03-21 ENCOUNTER — Encounter (HOSPITAL_COMMUNITY): Payer: Self-pay | Admitting: *Deleted

## 2015-03-21 DIAGNOSIS — Z794 Long term (current) use of insulin: Secondary | ICD-10-CM | POA: Insufficient documentation

## 2015-03-21 DIAGNOSIS — Z87891 Personal history of nicotine dependence: Secondary | ICD-10-CM | POA: Insufficient documentation

## 2015-03-21 DIAGNOSIS — M1990 Unspecified osteoarthritis, unspecified site: Secondary | ICD-10-CM | POA: Diagnosis not present

## 2015-03-21 DIAGNOSIS — K259 Gastric ulcer, unspecified as acute or chronic, without hemorrhage or perforation: Secondary | ICD-10-CM | POA: Diagnosis not present

## 2015-03-21 DIAGNOSIS — R109 Unspecified abdominal pain: Secondary | ICD-10-CM

## 2015-03-21 DIAGNOSIS — I2510 Atherosclerotic heart disease of native coronary artery without angina pectoris: Secondary | ICD-10-CM | POA: Diagnosis not present

## 2015-03-21 DIAGNOSIS — E785 Hyperlipidemia, unspecified: Secondary | ICD-10-CM | POA: Diagnosis not present

## 2015-03-21 DIAGNOSIS — Z79891 Long term (current) use of opiate analgesic: Secondary | ICD-10-CM | POA: Insufficient documentation

## 2015-03-21 DIAGNOSIS — I252 Old myocardial infarction: Secondary | ICD-10-CM | POA: Diagnosis not present

## 2015-03-21 DIAGNOSIS — Z79899 Other long term (current) drug therapy: Secondary | ICD-10-CM | POA: Diagnosis not present

## 2015-03-21 DIAGNOSIS — Z7982 Long term (current) use of aspirin: Secondary | ICD-10-CM | POA: Insufficient documentation

## 2015-03-21 DIAGNOSIS — I10 Essential (primary) hypertension: Secondary | ICD-10-CM | POA: Insufficient documentation

## 2015-03-21 DIAGNOSIS — K219 Gastro-esophageal reflux disease without esophagitis: Secondary | ICD-10-CM | POA: Diagnosis not present

## 2015-03-21 DIAGNOSIS — E119 Type 2 diabetes mellitus without complications: Secondary | ICD-10-CM | POA: Diagnosis not present

## 2015-03-21 DIAGNOSIS — R1013 Epigastric pain: Secondary | ICD-10-CM

## 2015-03-21 HISTORY — PX: EUS: SHX5427

## 2015-03-21 LAB — GLUCOSE, CAPILLARY: Glucose-Capillary: 244 mg/dL — ABNORMAL HIGH (ref 65–99)

## 2015-03-21 SURGERY — ESOPHAGEAL ENDOSCOPIC ULTRASOUND (EUS) RADIAL
Anesthesia: Monitor Anesthesia Care

## 2015-03-21 MED ORDER — LIDOCAINE HCL (CARDIAC) 20 MG/ML IV SOLN
INTRAVENOUS | Status: AC
  Filled 2015-03-21: qty 5

## 2015-03-21 MED ORDER — PROPOFOL 10 MG/ML IV BOLUS
INTRAVENOUS | Status: AC
  Filled 2015-03-21: qty 20

## 2015-03-21 MED ORDER — SODIUM CHLORIDE 0.9 % IV SOLN: INTRAVENOUS | Status: DC

## 2015-03-21 MED ORDER — PROPOFOL 500 MG/50ML IV EMUL
INTRAVENOUS | Status: DC | PRN
  Administered 2015-03-21: 120 ug/kg/min via INTRAVENOUS

## 2015-03-21 MED ORDER — LACTATED RINGERS IV SOLN
INTRAVENOUS | Status: DC
  Administered 2015-03-21: 1000 mL via INTRAVENOUS

## 2015-03-21 MED ORDER — PROPOFOL 10 MG/ML IV BOLUS
INTRAVENOUS | Status: DC | PRN
  Administered 2015-03-21: 30 mg via INTRAVENOUS
  Administered 2015-03-21: 50 mg via INTRAVENOUS

## 2015-03-21 MED ORDER — LIDOCAINE HCL (CARDIAC) 20 MG/ML IV SOLN
INTRAVENOUS | Status: DC | PRN
  Administered 2015-03-21: 50 mg via INTRAVENOUS

## 2015-03-21 MED ORDER — PROMETHAZINE HCL 25 MG/ML IJ SOLN: 6.2500 mg | INTRAMUSCULAR | Status: DC | PRN

## 2015-03-21 NOTE — Anesthesia Preprocedure Evaluation (Signed)
Anesthesia Evaluation  Patient identified by MRN, date of birth, ID band Patient awake    Reviewed: Allergy & Precautions, NPO status , Patient's Chart, lab work & pertinent test results  Airway Mallampati: II  TM Distance: >3 FB Neck ROM: Full    Dental no notable dental hx.    Pulmonary neg pulmonary ROS, former smoker,    Pulmonary exam normal breath sounds clear to auscultation       Cardiovascular hypertension, + CAD, + Past MI and + Cardiac Stents  Normal cardiovascular exam Rhythm:Regular Rate:Normal     Neuro/Psych negative neurological ROS  negative psych ROS   GI/Hepatic Neg liver ROS, GERD  ,  Endo/Other  diabetes, Insulin Dependent  Renal/GU negative Renal ROS  negative genitourinary   Musculoskeletal negative musculoskeletal ROS (+)   Abdominal   Peds negative pediatric ROS (+)  Hematology negative hematology ROS (+)   Anesthesia Other Findings   Reproductive/Obstetrics negative OB ROS                             Anesthesia Physical Anesthesia Plan  ASA: III  Anesthesia Plan: MAC   Post-op Pain Management:    Induction: Intravenous  Airway Management Planned: Simple Face Mask  Additional Equipment:   Intra-op Plan:   Post-operative Plan: Extubation in OR  Informed Consent: I have reviewed the patients History and Physical, chart, labs and discussed the procedure including the risks, benefits and alternatives for the proposed anesthesia with the patient or authorized representative who has indicated his/her understanding and acceptance.   Dental advisory given  Plan Discussed with: CRNA and Surgeon  Anesthesia Plan Comments:         Anesthesia Quick Evaluation

## 2015-03-21 NOTE — Progress Notes (Deleted)
Patient sent to CT after endoscopy

## 2015-03-21 NOTE — Discharge Instructions (Signed)
Endoscopic ultrasound ° °Care After °Please read the instructions outlined below and refer to this sheet in the next few weeks. These discharge instructions provide you with general information on caring for yourself after you leave the hospital. Your doctor may also give you specific instructions. While your treatment has been planned according to the most current medical practices available, unavoidable complications occasionally occur. If you have any problems or questions after discharge, please call Dr. Camaya Gannett ([facility]) at [phone]. ° °HOME CARE INSTRUCTIONS °Activity °· You may resume your regular activity but move at a slower pace for the next 24 hours.  °· Take frequent rest periods for the next 24 hours.  °· Walking will help expel (get rid of) the air and reduce the bloated feeling in your abdomen.  °· No driving for 24 hours (because of the anesthesia (medicine) used during the test).  °· You may shower.  °· Do not sign any important legal documents or operate any machinery for 24 hours (because of the anesthesia used during the test).  °Nutrition °· Drink plenty of fluids.  °· You may resume your normal diet.  °· Begin with a light meal and progress to your normal diet.  °· Avoid alcoholic beverages for 24 hours or as instructed by your caregiver.  °Medications °You may resume your normal medications unless your caregiver tells you otherwise. °What you can expect today °· You may experience abdominal discomfort such as a feeling of fullness or "gas" pains.  °· You may experience a sore throat for 2 to 3 days. This is normal. Gargling with salt water may help this.  °·  °SEEK IMMEDIATE MEDICAL CARE IF: °· You have excessive nausea (feeling sick to your stomach) and/or vomiting.  °· You have severe abdominal pain and distention (swelling).  °· You have trouble swallowing.  °· You have a temperature over 100° F (37.8° C).  °· You have rectal bleeding or vomiting of blood.  °Document  Released: 12/18/2003 Document Revised: 01/15/2011 Document Reviewed: 06/30/2007 °[employer]® Patient Information ©2012 [employer]. °

## 2015-03-21 NOTE — Anesthesia Postprocedure Evaluation (Signed)
  Anesthesia Post-op Note  Patient: Kelly [name]  Procedure(s) Performed: Procedure(s) (LRB): ESOPHAGEAL ENDOSCOPIC ULTRASOUND (EUS) RADIAL (N/A)  Patient Location: PACU  Anesthesia Type: MAC  Level of Consciousness: awake and alert   Airway and Oxygen Therapy: Patient Spontanous Breathing  Post-op Pain: mild  Post-op Assessment: Post-op Vital signs reviewed, Patient's Cardiovascular Status Stable, Respiratory Function Stable, Patent Airway and No signs of Nausea or vomiting  Last Vitals:  Filed Vitals:   03/21/15 1100  BP: 106/62  Pulse:   Temp:   Resp: 16    Post-op Vital Signs: stable   Complications: No apparent anesthesia complications

## 2015-03-21 NOTE — Transfer of Care (Signed)
Immediate Anesthesia Transfer of Care Note  Patient: Kelly [name]  Procedure(s) Performed: Procedure(s): ESOPHAGEAL ENDOSCOPIC ULTRASOUND (EUS) RADIAL (N/A)  Patient Location: PACU  Anesthesia Type:MAC  Level of Consciousness: awake, alert  and oriented  Airway & Oxygen Therapy: Patient Spontanous Breathing and Patient connected to nasal cannula oxygen  Post-op Assessment: Report given to RN and Post -op Vital signs reviewed and stable  Post vital signs: Reviewed and stable  Last Vitals:  Filed Vitals:   03/21/15 0815  BP: 123/64  Pulse: 81  Temp: 36.8 C  Resp: 19    Complications: No apparent anesthesia complications

## 2015-03-21 NOTE — H&P (Signed)
Patient interval history reviewed.  Patient examined again.  There has been no change from documented H/P dated 03/20/15 (scanned into chart from our office) except as documented above.  Assessment:  1.  Gastric ulcer, poorly healing.  Plan:  1.  Endoscopic ultrasound with possible biopsies. 2.  Risks (bleeding, infection, bowel perforation that could require surgery, sedation-related changes in cardiopulmonary systems), benefits (identification and possible treatment of source of symptoms, exclusion of certain causes of symptoms), and alternatives (watchful waiting, radiographic imaging studies, empiric medical treatment) of upper endoscopy with ultrasound and possible biopsies (EUS +/- FNA) were explained to patient/family in detail and patient wishes to proceed.

## 2015-03-21 NOTE — Op Note (Addendum)
[facility] [place], [address]   ENDOSCOPIC ULTRASOUND PROCEDURE REPORT  PATIENT: Kelly [name], Kelly [name]  MR#: [id number] BIRTHDATE: Apr 07, 1956  GENDER: male ENDOSCOPIST: Arta Silence, MD REFERRED BY:  Barney Drain, M.D. PROCEDURE DATE:  03/21/2015 PROCEDURE:   Upper EUS ASA CLASS:      Class III INDICATIONS:   1.  gastric ulcer. MEDICATIONS:  DESCRIPTION OF PROCEDURE:   After the risks benefits and alternatives of the procedure were  explained, informed consent was obtained. The patient was then placed in the left, lateral, decubitus postion and IV sedation was administered. Throughout the procedure, the patients blood pressure, pulse and oxygen saturations were monitored continuously.  Under direct visualization, the radial echoendoscope was introduced through the mouth  and advanced to the second portion of the duodenum      . Water was used as necessary to provide an acoustic interface. Estimated blood loss is zero unless otherwise noted in this procedure report. Upon completion of the imaging, water was removed and the patient was sent to the recovery room in satisfactory condition.    FINDINGS:      EGD:  Some nodularity, ulcerations, and atrophy seen in pre-pyloric antrum; no large ulcer or lesion noted; mucosal biopsies x 2 showed no malignancy.  EUS:  Thickened gastric wall in pre-pyloric antrum, of unclear significance, but is frequently a normal non-pathologic finding.  No obvious intramural mass within the antrum. Heterogeneous and hyperechoic pancreas, consistent with fatty pancreas.  No obvious mass in pancreas noted, though patient's pancreatic parenchyma seemed to readily blend with his neighboring connective tissue.  IMPRESSION:     As above.  No overt findings to suggest malignancy.   RECOMMENDATIONS:     1.  Watch for potential complications of procedure. 2.  Consider CT scan abdomen/pelvis for further evaluation; if  this is unrevealing, would likely consider repeat endoscopy in 6-12 months; if this repeat endoscopy shows no interval change and upcoming CT scan is unrevealing, then would likely not pursue any further evaluation of the antrum in absence of new/interval symptoms.   _______________________________ Lorrin MaisArta Silence, MD 03/21/2015 10:43 AM   CC:

## 2015-03-22 ENCOUNTER — Encounter (HOSPITAL_COMMUNITY): Payer: Self-pay | Admitting: Gastroenterology

## 2015-03-22 ENCOUNTER — Other Ambulatory Visit (HOSPITAL_COMMUNITY): Payer: Self-pay | Admitting: Gastroenterology

## 2015-03-22 DIAGNOSIS — K259 Gastric ulcer, unspecified as acute or chronic, without hemorrhage or perforation: Secondary | ICD-10-CM

## 2015-03-23 ENCOUNTER — Ambulatory Visit (HOSPITAL_COMMUNITY)
Admission: RE | Admit: 2015-03-23 | Discharge: 2015-03-23 | Disposition: A | Discharge status: Home / Self Care | Payer: BC Managed Care – PPO | Source: Ambulatory Visit | Attending: Gastroenterology | Admitting: Gastroenterology

## 2015-03-23 ENCOUNTER — Ambulatory Visit (HOSPITAL_COMMUNITY): Payer: BC Managed Care – PPO

## 2015-03-23 DIAGNOSIS — K259 Gastric ulcer, unspecified as acute or chronic, without hemorrhage or perforation: Secondary | ICD-10-CM | POA: Diagnosis present

## 2015-03-23 DIAGNOSIS — Z8572 Personal history of non-Hodgkin lymphomas: Secondary | ICD-10-CM | POA: Diagnosis not present

## 2015-03-23 DIAGNOSIS — R932 Abnormal findings on diagnostic imaging of liver and biliary tract: Secondary | ICD-10-CM | POA: Insufficient documentation

## 2015-03-23 DIAGNOSIS — K5730 Diverticulosis of large intestine without perforation or abscess without bleeding: Secondary | ICD-10-CM | POA: Diagnosis not present

## 2015-03-23 LAB — POCT I-STAT CREATININE: Creatinine, Ser: 1.1 mg/dL (ref 0.61–1.24)

## 2015-03-23 MED ORDER — IOHEXOL 300 MG/ML  SOLN
100.0000 mL | Freq: Once | INTRAMUSCULAR | Status: AC | PRN
  Administered 2015-03-23: 100 mL via INTRAVENOUS

## 2015-04-16 LAB — HEMOGLOBIN A1C: HEMOGLOBIN A1C: 7.9 % — AB (ref 4.0–6.0)

## 2015-04-19 ENCOUNTER — Ambulatory Visit (INDEPENDENT_AMBULATORY_CARE_PROVIDER_SITE_OTHER): Payer: Self-pay | Admitting: Otolaryngology

## 2015-04-21 ENCOUNTER — Other Ambulatory Visit: Payer: Self-pay | Admitting: "Endocrinology

## 2015-04-23 ENCOUNTER — Other Ambulatory Visit: Payer: Self-pay

## 2015-04-23 MED ORDER — LIRAGLUTIDE 18 MG/3ML ~~LOC~~ SOPN: 1.8000 mg | PEN_INJECTOR | Freq: Every day | SUBCUTANEOUS | Status: DC

## 2015-04-24 ENCOUNTER — Encounter: Payer: Self-pay | Admitting: Gastroenterology

## 2015-04-27 ENCOUNTER — Encounter: Payer: Self-pay | Admitting: "Endocrinology

## 2015-04-27 ENCOUNTER — Ambulatory Visit (INDEPENDENT_AMBULATORY_CARE_PROVIDER_SITE_OTHER): Payer: BC Managed Care – PPO | Admitting: "Endocrinology

## 2015-04-27 VITALS — BP 134/78 | HR 72 | Ht 74.0 in | Wt 315.0 lb

## 2015-04-27 DIAGNOSIS — I10 Essential (primary) hypertension: Secondary | ICD-10-CM | POA: Diagnosis not present

## 2015-04-27 DIAGNOSIS — E785 Hyperlipidemia, unspecified: Secondary | ICD-10-CM | POA: Diagnosis not present

## 2015-04-27 DIAGNOSIS — E1159 Type 2 diabetes mellitus with other circulatory complications: Secondary | ICD-10-CM

## 2015-04-27 NOTE — Progress Notes (Signed)
Subjective:    Patient [name], male    DOB: 1955-07-10, PCP Kelly Bogus, MD   Past Medical History  Diagnosis Date  . Type 2 diabetes mellitus ([place])   . Coronary atherosclerosis of native coronary artery     BMS LAD and PTCA RCA 2000,BMS RCA/PLA 2001  . Hyperlipidemia   . GERD (gastroesophageal reflux disease)   . Lupus anticoagulant positive   . Gastric ulcer   . Diabetes mellitus   . HTN (hypertension)   . Anxiety   . Arthritis   . MI (myocardial infarction) ([place]) 2001  . Low-grade NHL (non-Hodgkin's lymphoma)    Past Surgical History  Procedure Laterality Date  . Inguinal lymph node biopsy      right inguinal lymph node biopsy  . Back surgery      multiple  . Neck surgery    . Axillary abscess      left incision and drainage left  . Dental extractions    . Colonoscopy  June 2008    Dr. Dellis Filbert Medoff: Mild left colonic diverticulosis  . Esophagogastroduodenoscopy  June 2008    Dr. Dellis Filbert Medoff: Gastric ulcer, antral, biopsy with reactive changes associated glandular atrophy, no H pylori. No features of lymphoma. Likely NSAID related  . Shoulder arthroscopy Left   . Cardiac stents      x5 stents  . Esophagogastroduodenoscopy  August 2008    Dr. Richmond Campbell: Complete healing of gastric ulcers.  . Esophagogastroduodenoscopy (egd) with propofol N/A 08/15/2014    SLF: 1. Abnormal pain due to ulcers & gastriitis   . Esophageal biopsy  08/15/2014    Procedure: BIOPSY;  Surgeon: Danie Binder, MD;  Location: [facility];  Service: Endoscopy;;  Gastric  . Esophagogastroduodenoscopy (egd) with propofol N/A 01/19/2015    Procedure: ESOPHAGOGASTRODUODENOSCOPY (EGD) WITH PROPOFOL;  Surgeon: Danie Binder, MD;  Location: [facility];  Service: Endoscopy;  Laterality: N/A;  . Polypectomy  01/19/2015    Procedure: POLYPECTOMY (GASTRIC);  Surgeon: Danie Binder, MD;  Location: [facility];  Service: Endoscopy;;  . Esophageal biopsy  01/19/2015    Procedure: BIOPSY (GASTRIC  ULCER);  Surgeon: Danie Binder, MD;  Location: [facility];  Service: Endoscopy;;  . Eus N/A 03/21/2015    Procedure: ESOPHAGEAL ENDOSCOPIC ULTRASOUND (EUS) RADIAL;  Surgeon: Arta Silence, MD;  Location: [facility];  Service: Endoscopy;  Laterality: N/A;   Social History   Social History  . Marital Status: Married    Spouse Name: N/A  . Number of Children: N/A  . Years of Education: N/A   Social History Main Topics  . Smoking status: Former Smoker -- 1.00 packs/day for 23 years    Types: Cigarettes    Start date: 10/30/1970    Quit date: 10/29/1993  . Smokeless tobacco: Former Systems developer    Types: Snuff, Chew  . Alcohol Use: No  . Drug Use: No  . Sexual Activity: No   Other Topics Concern  . None   Social History Narrative   Outpatient Encounter Prescriptions as of 04/27/2015  Medication Sig  . aspirin EC 81 MG tablet Take 81 mg by mouth daily.  . cyclobenzaprine (FLEXERIL) 10 MG tablet Take 10 mg by mouth 3 (three) times daily as needed for muscle spasms.  Marland Kitchen FLUoxetine (PROZAC) 20 MG capsule Take 1 capsule (20 mg total) by mouth daily. (Patient taking differently: Take 20 mg by mouth 2 (two) times daily. )  . gabapentin (NEURONTIN) 300 MG capsule  Take 300 mg by mouth at bedtime.  . insulin aspart (NOVOLOG) 100 UNIT/ML injection Inject 12-18 Units into the skin 3 (three) times daily before meals.   . insulin glargine (LANTUS) 100 UNIT/ML injection Inject 70 Units into the skin daily.   . isosorbide mononitrate (IMDUR) 30 MG 24 hr tablet TAKE 1 TABLET BY MOUTH EVERY DAY  . losartan (COZAAR) 100 MG tablet Take 100 mg by mouth daily.  . metFORMIN (GLUCOPHAGE) 500 MG tablet Take 1,000 mg by mouth 2 (two) times daily with a meal.    . metoprolol (LOPRESSOR) 50 MG tablet Take 25 mg by mouth 2 (two) times daily.    . nitroGLYCERIN (NITROSTAT) 0.4 MG SL tablet Place 1 tablet (0.4 mg total) under the tongue every 5 (five) minutes as needed.  Marland Kitchen oxyCODONE-acetaminophen (PERCOCET) 10-325 MG per  tablet Take 1 tablet by mouth every 6 (six) hours as needed for pain.   . pantoprazole (PROTONIX) 40 MG tablet Take 40 mg by mouth daily before breakfast.   . simvastatin (ZOCOR) 10 MG tablet TAKE 1 TABLET BY MOUTH EVERY NIGHT AT BEDTIME  . spironolactone (ALDACTONE) 50 MG tablet Take 1 tablet (50 mg total) by mouth daily.  Marland Kitchen VICTOZA 18 MG/3ML SOPN ADMINISTER 1.8 MG UNDER THE SKIN EVERY DAY  . zolpidem (AMBIEN) 10 MG tablet Take 10 mg by mouth at bedtime as needed for sleep.   No facility-administered encounter medications on file as of 04/27/2015.   ALLERGIES: No Known Allergies VACCINATION STATUS:  There is no immunization history on file for this patient.  Diabetes He presents for his follow-up diabetic visit. He has type 2 diabetes mellitus. Onset time: He was diagnosed at approximate age of 38 years. His disease course has been improving. There are no hypoglycemic associated symptoms. Pertinent negatives for hypoglycemia include no confusion, headaches, pallor or seizures. There are no diabetic associated symptoms. Pertinent negatives for diabetes include no chest pain, no fatigue, no polydipsia, no polyphagia, no polyuria and no weakness. Symptoms are improving. Diabetic complications include heart disease and nephropathy. Risk factors for coronary artery disease include diabetes mellitus, dyslipidemia, hypertension, male sex, obesity, sedentary lifestyle and tobacco exposure. Current diabetic treatment includes intensive insulin program and oral agent (dual therapy). He is compliant with treatment most of the time. He is following a generally unhealthy diet. Prior visit with dietitian: Declines dietitian referral. His breakfast blood glucose range is generally 140-180 mg/dl. His lunch blood glucose range is generally 140-180 mg/dl. His dinner blood glucose range is generally 140-180 mg/dl. His overall blood glucose range is 140-180 mg/dl. An ACE inhibitor/angiotensin II receptor blocker is  being taken.  Hyperlipidemia This is a chronic problem. The current episode started more than 1 year ago. Exacerbating diseases include diabetes and obesity. Pertinent negatives include no chest pain, myalgias or shortness of breath. Current antihyperlipidemic treatment includes statins. Risk factors for coronary artery disease include diabetes mellitus, dyslipidemia, hypertension, male sex and a sedentary lifestyle.  Hypertension This is a chronic problem. The current episode started more than 1 year ago. The problem is controlled. Pertinent negatives include no chest pain, headaches, neck pain, palpitations or shortness of breath. Past treatments include angiotensin blockers. The current treatment provides moderate improvement. Hypertensive end-organ damage includes CAD/MI.     Review of Systems  Constitutional: Negative for fatigue and unexpected weight change.  HENT: Negative for dental problem, mouth sores and trouble swallowing.   Eyes: Negative for visual disturbance.  Respiratory: Negative for cough, choking, chest tightness, shortness  of breath and wheezing.   Cardiovascular: Negative for chest pain, palpitations and leg swelling.  Gastrointestinal: Negative for nausea, vomiting, abdominal pain, diarrhea, constipation and abdominal distention.  Endocrine: Negative for polydipsia, polyphagia and polyuria.  Genitourinary: Negative for dysuria, urgency, hematuria and flank pain.  Musculoskeletal: Negative for myalgias, back pain, gait problem and neck pain.  Skin: Negative for pallor, rash and wound.  Neurological: Negative for seizures, syncope, weakness, numbness and headaches.  Psychiatric/Behavioral: Negative.  Negative for confusion and dysphoric mood.    Objective:    BP 134/78 mmHg  Pulse 72  Ht 6\' 2"  ([id number] m)  Wt 315 lb (142.883 kg)  BMI 40.43 kg/m2  SpO2 95%  Wt Readings from Last 3 Encounters:  04/27/15 315 lb (142.883 kg)  03/21/15 302 lb (136.986 kg)  01/16/15 302  lb (136.986 kg)    Physical Exam  Constitutional: He is oriented to person, place, and time. He appears well-developed and well-nourished. He is cooperative. No distress.  HENT:  Head: Normocephalic and atraumatic.  Eyes: EOM are normal.  Neck: Normal range of motion. Neck supple. No tracheal deviation present. No thyromegaly present.  Cardiovascular: Normal rate, S1 normal, S2 normal and normal heart sounds.  Exam reveals no gallop.   No murmur heard. Pulses:      Dorsalis pedis pulses are 1+ on the right side, and 1+ on the left side.       Posterior tibial pulses are 1+ on the right side, and 1+ on the left side.  Pulmonary/Chest: Breath sounds normal. No respiratory distress. He has no wheezes.  Abdominal: Soft. Bowel sounds are normal. He exhibits no distension. There is no tenderness. There is no guarding and no CVA tenderness.  Musculoskeletal: He exhibits no edema.       Right shoulder: He exhibits no swelling and no deformity.  Neurological: He is alert and oriented to person, place, and time. He has normal strength and normal reflexes. No cranial nerve deficit or sensory deficit. Gait normal.  Skin: Skin is warm and dry. No rash noted. No cyanosis. Nails show no clubbing.  Psychiatric: He has a normal mood and affect. His speech is normal and behavior is normal. Judgment and thought content normal. Cognition and memory are normal.    Results for orders placed or performed in visit on 04/27/15  Hemoglobin A1c  Result Value Ref Range   Hgb A1c MFr Bld 7.9 (A) 4.0 - 6.0 %   Complete Blood Count (Most recent): Lab Results  Component Value Date   WBC 8.9 01/16/2015   HGB 13.3 01/16/2015   HCT 38.3* 01/16/2015   MCV 96.5 01/16/2015   PLT 180 01/16/2015   Chemistry (most recent): Lab Results  Component Value Date   NA 136 01/16/2015   K 4.1 01/16/2015   CL 102 01/16/2015   CO2 24 01/16/2015   BUN 22* 01/16/2015   CREATININE 1.10 03/23/2015   Diabetic Labs (most  recent): Lab Results  Component Value Date   HGBA1C 7.9* 04/16/2015   HGBA1C * 06/30/2009    9.7 (NOTE) The ADA recommends the following therapeutic goal for glycemic control related to Hgb A1c measurement: Goal of therapy: <6.5 Hgb A1c  Reference: American Diabetes Association: Clinical Practice Recommendations 2010, Diabetes Care, 2010, 33: (Suppl  1).   HGBA1C * 08/22/2008    7.0 (NOTE) The ADA recommends the following therapeutic goal for glycemic control related to Hgb A1c measurement: Goal of therapy: <6.5 Hgb A1c  Reference: American Diabetes Association:  Clinical Practice Recommendations 2010, Diabetes Care, 2010, 33: (Suppl  1).   Lipid profile (most recent): Lab Results  Component Value Date   TRIG 122 06/28/2010   CHOL 116 06/28/2010         Assessment & Plan:   1. Type 2 diabetes mellitus with vascular disease (HCC)  -His diabetes is  complicated by coronary artery disease and patient remains at a high risk for more acute and chronic complications of diabetes which include CAD, CVA, CKD, retinopathy, and neuropathy. These are all discussed in detail with the patient.  Patient came with improved glucose profile, and  recent A1c of 7.9 %.  Glucose logs and insulin administration records pertaining to this visit,  to be scanned into patient's records.  Recent labs reviewed.   - I have re-counseled the patient on diet management and weight loss  by adopting a carbohydrate restricted / protein rich  Diet.  - Suggestion is made for patient to avoid simple carbohydrates   from their diet including Cakes , Desserts, Ice Cream,  Soda (  diet and regular) , Sweet Tea , Candies,  Chips, Cookies, Artificial Sweeteners,   and "Sugar-free" Products .  This will help patient to have stable blood glucose profile and potentially avoid unintended  Weight gain.  - Patient is advised to stick to a routine mealtimes to eat 3 meals  a day and avoid unnecessary snacks ( to snack only to  correct hypoglycemia).  - The patient  Declines it with CDE.  - I have approached patient with the following individualized plan to manage diabetes and patient agrees.  -Continue basal insulin Lantus 70 units qhs, and continue Novolog 12 units TIDAC for pre-meal BG readings of 90-150mg /dl, plus patient specific correction dose of rapid acting insulin for unexpected hyperglycemia above 150mg /dl, associated with strict monitoring of BG AC and HS.  -Adjustment parameters for hypo and hyperglycemia were given in a written document to patient. -Patient is encouraged to call clinic for blood glucose levels less than 70 or above 300 mg /dl.  I will continue MTF 1000mg  po BID, and increase Victoza to 1.8mg  sq daily to advance as tolerated.   - Patient specific target  for A1c; LDL, HDL, Triglycerides, and  Waist Circumference were discussed in detail.  2) BP/HTN: Controlled . Continue current medications including ACEI/ARB. 3) Lipids/HPL:  continue statins. 4)  Weight/Diet: CDE consult in progress, exercise, and carbohydrates information provided.  5) Chronic Care/Health Maintenance:  -Patient is on ACEI/ARB and Statin medications and encouraged to continue to follow up with Ophthalmology, Podiatrist at least yearly or according to recommendations, and advised to  stay away from smoking. I have recommended yearly flu vaccine and pneumonia vaccination at least every 5 years; moderate intensity exercise for up to 150 minutes weekly; and  sleep for at least 7 hours a day.  - 25 minutes of time was spent on the care of this patient , 50% of which was applied for counseling on diabetes complications and their preventions.  - I advised patient to maintain close follow up with HAWKINS,EDWARD L, MD for primary care needs.  Patient is asked to bring meter and  blood glucose logs during their next visit.    Follow up plan: -Return in about 3 months (around 07/26/2015) for diabetes, high blood pressure, high  cholesterol, follow up with pre-visit labs, meter, and logs.  Glade Lloyd, MD Phone: [phone]  Fax: [phone]   04/27/2015, 8:43 AM

## 2015-04-27 NOTE — Patient Instructions (Signed)

## 2015-05-08 ENCOUNTER — Other Ambulatory Visit (HOSPITAL_COMMUNITY): Payer: Self-pay

## 2015-05-08 DIAGNOSIS — R609 Edema, unspecified: Secondary | ICD-10-CM

## 2015-05-08 MED ORDER — SPIRONOLACTONE 50 MG PO TABS: 50.0000 mg | ORAL_TABLET | Freq: Every day | ORAL | Status: DC

## 2015-05-14 ENCOUNTER — Other Ambulatory Visit: Payer: Self-pay | Admitting: "Endocrinology

## 2015-05-24 ENCOUNTER — Ambulatory Visit: Payer: Self-pay | Admitting: Nurse Practitioner

## 2015-05-24 ENCOUNTER — Ambulatory Visit: Payer: Self-pay | Admitting: Gastroenterology

## 2015-05-31 ENCOUNTER — Other Ambulatory Visit: Payer: Self-pay

## 2015-05-31 ENCOUNTER — Ambulatory Visit (INDEPENDENT_AMBULATORY_CARE_PROVIDER_SITE_OTHER): Payer: BC Managed Care – PPO | Admitting: Gastroenterology

## 2015-05-31 ENCOUNTER — Encounter: Payer: Self-pay | Admitting: Gastroenterology

## 2015-05-31 VITALS — BP 118/69 | HR 74 | Temp 97.0°F | Ht 76.0 in | Wt 310.2 lb

## 2015-05-31 DIAGNOSIS — Z1211 Encounter for screening for malignant neoplasm of colon: Secondary | ICD-10-CM | POA: Diagnosis not present

## 2015-05-31 DIAGNOSIS — K253 Acute gastric ulcer without hemorrhage or perforation: Secondary | ICD-10-CM

## 2015-05-31 DIAGNOSIS — K7460 Unspecified cirrhosis of liver: Secondary | ICD-10-CM | POA: Diagnosis not present

## 2015-05-31 NOTE — Progress Notes (Signed)
ON RECALL  °

## 2015-05-31 NOTE — Patient Instructions (Signed)
THE SCARRING IN YOUR LIVER IS DUE TO FATTY LIVER DISEASE.   CONTINUE YOUR WEIGHT LOSS EFFORTS. I AM REFERRING YOU TO DR. GOSRANI IN [place].  AVOID REFLUX TRIGGERS. SEE INFO BELOW.  FOLLOW A LOWFAT /DIABETIC DIET. MEATS SHOULD BE BAKED, BROILED, OR BOILED. AVOID FRIED FOODS.  CONTINUE PANTOPRAZOLE. TAKE 30 MINUTES PRIOR TO BREAKFAST.  FOLLOW UP IN 6 MOS. YOU WILL NEED AN ULTRASOUND AND LABS DRAWN EVERY 6 MOS TO CHECK YOUR LIVER.     Lifestyle and home remedies TO MANAGE REFLUX/CHEST PAIN  You may eliminate or reduce the frequency of heartburn by making the following lifestyle changes:  . Control your weight. Being overweight is a major risk factor for heartburn and GERD. Excess pounds put pressure on your abdomen, pushing up your stomach and causing acid to back up into your esophagus.   . Eat smaller meals. 4 TO 6 MEALS A DAY. This reduces pressure on the lower esophageal sphincter, helping to prevent the valve from opening and acid from washing back into your esophagus.   Dolphus Jenny your belt. Clothes that fit tightly around your waist put pressure on your abdomen and the lower esophageal sphincter.   . Eliminate heartburn triggers. Everyone has specific triggers. Common triggers such as fatty or fried foods, spicy food, tomato sauce, carbonated beverages, alcohol, chocolate, mint, garlic, onion, caffeine and nicotine may make heartburn worse.   Marland Kitchen Avoid stooping or bending. Tying your shoes is OK. Bending over for longer periods to weed your garden isn't, especially soon after eating.   . Don't lie down after a meal. Wait at least three to four hours after eating before going to bed, and don't lie down right after eating.   Marland Kitchen PUT THE HEAD OF YOUR BED ON 6 INCH BLOCKS.   Alternative medicine . Several home remedies exist for treating GERD, but they provide only temporary relief. They include drinking baking soda (sodium bicarbonate) added to water or drinking other fluids such  as baking soda mixed with cream of tartar and water.  . Although these liquids create temporary relief by neutralizing, washing away or buffering acids, eventually they aggravate the situation by adding gas and fluid to your stomach, increasing pressure and causing more acid reflux. Further, adding more sodium to your diet may increase your blood pressure and add stress to your heart, and excessive bicarbonate ingestion can alter the acid-base balance in your body.      Low-Fat Diet BREADS, CEREALS, PASTA, RICE, DRIED PEAS, AND BEANS These products are high in carbohydrates and most are low in fat. Therefore, they can be increased in the diet as substitutes for fatty foods. They too, however, contain calories and should not be eaten in excess. Cereals can be eaten for snacks as well as for breakfast.   FRUITS AND VEGETABLES It is good to eat fruits and vegetables. Besides being sources of fiber, both are rich in vitamins and some minerals. They help you get the daily allowances of these nutrients. Fruits and vegetables can be used for snacks and desserts.  MEATS Limit lean meat, chicken, [place], and fish to no more than 6 ounces per day. Beef, Pork, and Lamb Use lean cuts of beef, pork, and lamb. Lean cuts include:  Extra-lean ground beef.  Arm roast.  Sirloin tip.  Center-cut ham.  Round steak.  Loin chops.  Rump roast.  Tenderloin.  Trim all fat off the outside of meats before cooking. It is not necessary to severely decrease the intake  of red meat, but lean choices should be made. Lean meat is rich in protein and contains a highly absorbable form of iron. Premenopausal women, in particular, should avoid reducing lean red meat because this could increase the risk for low red blood cells (iron-deficiency anemia).  Chicken and [place] These are good sources of protein. The fat of poultry can be reduced by removing the skin and underlying fat layers before cooking. Chicken and [place] can  be substituted for lean red meat in the diet. Poultry should not be fried or covered with high-fat sauces. Fish and Shellfish Fish is a good source of protein. Shellfish contain cholesterol, but they usually are low in saturated fatty acids. The preparation of fish is important. Like chicken and [place], they should not be fried or covered with high-fat sauces. EGGS Egg whites contain no fat or cholesterol. They can be eaten often. Try 1 to 2 egg whites instead of whole eggs in recipes or use egg substitutes that do not contain yolk. MILK AND DAIRY PRODUCTS Use skim or 1% milk instead of 2% or whole milk. Decrease whole milk, natural, and processed cheeses. Use nonfat or low-fat (2%) cottage cheese or low-fat cheeses made from vegetable oils. Choose nonfat or low-fat (1 to 2%) yogurt. Experiment with evaporated skim milk in recipes that call for heavy cream. Substitute low-fat yogurt or low-fat cottage cheese for sour cream in dips and salad dressings. Have at least 2 servings of low-fat dairy products, such as 2 glasses of skim (or 1%) milk each day to help get your daily calcium intake. FATS AND OILS Reduce the total intake of fats, especially saturated fat. Butterfat, lard, and beef fats are high in saturated fat and cholesterol. These should be avoided as much as possible. Vegetable fats do not contain cholesterol, but certain vegetable fats, such as coconut oil, palm oil, and palm kernel oil are very high in saturated fats. These should be limited. These fats are often used in bakery goods, processed foods, popcorn, oils, and nondairy creamers. Vegetable shortenings and some peanut butters contain hydrogenated oils, which are also saturated fats. Read the labels on these foods and check for saturated vegetable oils. Unsaturated vegetable oils and fats do not raise blood cholesterol. However, they should be limited because they are fats and are high in calories. Total fat should still be limited to 30%  of your daily caloric intake. Desirable liquid vegetable oils are corn oil, cottonseed oil, olive oil, canola oil, safflower oil, soybean oil, and sunflower oil. Peanut oil is not as good, but small amounts are acceptable. Buy a heart-healthy tub margarine that has no partially hydrogenated oils in the ingredients. Mayonnaise and salad dressings often are made from unsaturated fats, but they should also be limited because of their high calorie and fat content. Seeds, nuts, peanut butter, olives, and avocados are high in fat, but the fat is mainly the unsaturated type. These foods should be limited mainly to avoid excess calories and fat. OTHER EATING TIPS Snacks  Most sweets should be limited as snacks. They tend to be rich in calories and fats, and their caloric content outweighs their nutritional value. Some good choices in snacks are graham crackers, melba toast, soda crackers, bagels (no egg), English muffins, fruits, and vegetables. These snacks are preferable to snack crackers, [place] fries, TORTILLA CHIPS, and POTATO chips. Popcorn should be air-popped or cooked in small amounts of liquid vegetable oil. Desserts Eat fruit, low-fat yogurt, and fruit ices instead of pastries, cake,  and cookies. Sherbet, angel food cake, gelatin dessert, frozen low-fat yogurt, or other frozen products that do not contain saturated fat (pure fruit juice bars, frozen ice pops) are also acceptable.  COOKING METHODS Choose those methods that use little or no fat. They include: Poaching.  Braising.  Steaming.  Grilling.  Baking.  Stir-frying.  Broiling.  Microwaving.  Foods can be cooked in a nonstick pan without added fat, or use a nonfat cooking spray in regular cookware. Limit fried foods and avoid frying in saturated fat. Add moisture to lean meats by using water, broth, cooking wines, and other nonfat or low-fat sauces along with the cooking methods mentioned above. Soups and stews should be chilled after  cooking. The fat that forms on top after a few hours in the refrigerator should be skimmed off. When preparing meals, avoid using excess salt. Salt can contribute to raising blood pressure in some people.  EATING AWAY FROM HOME Order entres, potatoes, and vegetables without sauces or butter. When meat exceeds the size of a deck of cards (3 to 4 ounces), the rest can be taken home for another meal. Choose vegetable or fruit salads and ask for low-calorie salad dressings to be served on the side. Use dressings sparingly. Limit high-fat toppings, such as bacon, crumbled eggs, cheese, sunflower seeds, and olives. Ask for heart-healthy tub margarine instead of butter.

## 2015-05-31 NOTE — Assessment & Plan Note (Signed)
AVERAGE RISK  NEXT TCS IN 2018

## 2015-05-31 NOTE — Assessment & Plan Note (Addendum)
WELL COMPENSATED DISEASE-CHILD PUGH A  CONTINUE YOUR WEIGHT LOSS EFFORTS.  REFERRING TO DR. GOSRANI IN [place] FOR WEIGHT REDUCTION. AVOID REFLUX TRIGGERS.  FOLLOW A LOWFAT /DIABETIC DIET. MEATS SHOULD BE BAKED, BROILED, OR BOILED. AVOID FRIED FOODS. CONTINUE PANTOPRAZOLE. TAKE 30 MINUTES PRIOR TO BREAKFAST. FOLLOW UP IN 6 MOS.WILL NEED RUQ ULTRASOUND AND LABS DRAWN EVERY 6 MOS. CHECK CHRONIC HEP PANEL. EGD IN 2018  GREATER THAN 50% WAS SPENT IN COUNSELING & COORDINATION OF CARE WITH THE PATIENT: DISCUSSED DIFFERENTIAL DIAGNOSIS, PROCEDURE, BENEFITS, RISKS, AND MANAGEMENT OF FATTY LIVER, GERD, AND DIVERTICULOSIS. TOTAL ENCOUNTER TIME: 25 MINS.

## 2015-05-31 NOTE — Assessment & Plan Note (Signed)
ULCERS HEALED.  MANAGE RISK FACTORS. CONTINUE TO MONITOR SYMPTOMS. CONTINUE PROTONIX. TAKE 30 MINUTES PRIOR TO BREAKFAST.

## 2015-05-31 NOTE — Progress Notes (Signed)
cc'ed to pcp °

## 2015-05-31 NOTE — Progress Notes (Signed)
Subjective:    Patient [name], male    DOB: [birth year], 58 y.o.   MRN: [id number]  Kelly L, MD  HPI Pt concerned about diagnosis of cirrhosis. HAS QUESTIONS ABOUT EPISODIC DRY HEAVES, AND DIVERTICULOSIS. HAS CHRONIC BACK AND [name] KNEE PAIN LIMITING HIS ABILITY TO WALK ON TREADMILL. RARE NAUSEA. CAN HAVE SPELLS OF VOMITING: NO BLOOD. EPISODE MAY BE RELATED TO FOOD 7 ASSOCIATED WITH MILD CHEST PAIN/HEARTBURN AT TIMES. Marland Kitchen SEEN BY DR. Paulita Fujita AND HAD EUS. BOWELS MOVE GOOD. WANTS TO LOSE WEIGHT. FELT BETTER AT 2260 LBS.  PT DENIES FEVER, CHILLS, HEMATOCHEZIA, HEMATEMESIS,  melena, diarrhea, CHEST PAIN, SHORTNESS OF BREATH,  CHANGE IN BOWEL IN HABITS, constipation, abdominal pain, OR Problems swallowing..   Past Medical History  Diagnosis Date  . Type 2 diabetes mellitus ([place])   . Coronary atherosclerosis of native coronary artery     BMS LAD and PTCA RCA 2000,BMS RCA/PLA 2001  . Hyperlipidemia   . GERD (gastroesophageal reflux disease)   . Lupus anticoagulant positive   . Gastric ulcer   . Diabetes mellitus   . HTN (hypertension)   . Anxiety   . Arthritis   . MI (myocardial infarction) ([place]) 2001  . Low-grade NHL (non-Hodgkin's lymphoma)    Past Surgical History  Procedure Laterality Date  . Inguinal lymph node biopsy      right inguinal lymph node biopsy  . Back surgery      multiple  . Neck surgery    . Axillary abscess      left incision and drainage left  . Dental extractions    . Colonoscopy  June 2008    Dr. Dellis Filbert Medoff: Mild left colonic diverticulosis  . Esophagogastroduodenoscopy  June 2008    Dr. Dellis Filbert Medoff: Gastric ulcer, antral, biopsy with reactive changes associated glandular atrophy, no H pylori. No features of lymphoma. Likely NSAID related  . Shoulder arthroscopy Left   . Cardiac stents      x5 stents  . Esophagogastroduodenoscopy  August 2008    Dr. Richmond Campbell: Complete healing of gastric ulcers.  . Esophagogastroduodenoscopy  (egd) with propofol N/A 08/15/2014    SLF: 1. Abnormal pain due to ulcers & gastriitis   . Esophageal biopsy  08/15/2014    Procedure: BIOPSY;  Surgeon: Danie Binder, MD;  Location: [facility];  Service: Endoscopy;;  Gastric  . Esophagogastroduodenoscopy (egd) with propofol N/A 01/19/2015    SLF: 1. Persistant ulcer with firm base 2. single gastric polyp found in the gastric antrum. Ulcer base   . Polypectomy  01/19/2015    Procedure: POLYPECTOMY (GASTRIC);  Surgeon: Danie Binder, MD;  Location: [facility];  Service: Endoscopy;;  . Esophageal biopsy  01/19/2015    Procedure: BIOPSY (GASTRIC ULCER);  Surgeon: Danie Binder, MD;  Location: [facility];  Service: Endoscopy;;  . Eus N/A 03/21/2015    Procedure: ESOPHAGEAL ENDOSCOPIC ULTRASOUND (EUS) RADIAL;  Surgeon: Arta Silence, MD;  [facility]: [facility];  Service: Endoscopy;  Laterality: N/A;    Kelly [name]  Current Outpatient Prescriptions  Medication Sig Dispense Refill  . aspirin EC 81 MG tablet Take 81 mg by mouth daily.    . cyclobenzaprine (FLEXERIL) 10 MG tablet Take 10 mg by mouth 3 (three) times daily as needed for muscle spasms.    Marland Kitchen FLUoxetine (PROZAC) 20 MG capsule Take 1 capsule (20 mg total) by mouth daily. (Patient taking differently: Take 20 mg by mouth 2 (two) times daily. ) 30 capsule 3  .  gabapentin (NEURONTIN) 300 MG capsule Take 300 mg by mouth at bedtime.    . insulin glargine (LANTUS) 100 UNIT/ML injection Inject 70 Units into the skin daily.     Marland Kitchen losartan (COZAAR) 100 MG tablet Take 100 mg by mouth daily.    . metFORMIN (GLUCOPHAGE) 500 MG tablet Take 1,000 mg by mouth 2 (two) times daily with a meal.      . metoprolol (LOPRESSOR) 50 MG tablet Take 25 mg by mouth 2 (two) times daily.      . nitroGLYCERIN (NITROSTAT) 0.4 MG SL tablet Place 1 tablet (0.4 mg total) under the tongue every 5 (five) minutes as needed. 25 tablet 6  . NOVOLOG FLEXPEN 100 UNIT/ML FlexPen ADMINISTER 15 TO 21 UNITS UNDER THE SKIN THREE TIMES DAILY 30 mL 2  .  oxyCODONE-acetaminophen (PERCOCET) 10-325 MG per tablet Take 1 tablet by mouth every 6 (six) hours as needed for pain.     . pantoprazole (PROTONIX) 40 MG tablet Take 40 mg by mouth daily before breakfast.     . simvastatin (ZOCOR) 10 MG tablet TAKE 1 TABLET BY MOUTH EVERY NIGHT AT BEDTIME 30 tablet 2  . spironolactone (ALDACTONE) 50 MG tablet Take 1 tablet (50 mg total) by mouth daily. 30 tablet 2  . VICTOZA 18 MG/3ML SOPN ADMINISTER 1.8 MG UNDER THE SKIN EVERY DAY 9 mL 2  . zolpidem (AMBIEN) 10 MG tablet Take 10 mg by mouth at bedtime as needed for sleep.    . isosorbide mononitrate (IMDUR) 30 MG 24 hr tablet TAKE 1 TABLET BY MOUTH EVERY DAY (Patient not taking: Reported on 05/31/2015) 30 tablet 6      Review of Systems PER HPI OTHERWISE ALL SYSTEMS ARE NEGATIVE.     Objective:   Physical Exam  Constitutional: He is oriented to person, place, and time. He appears well-developed and well-nourished. No distress.  HENT:  Head: Normocephalic and atraumatic.  Mouth/Throat: Oropharynx is clear and moist. No oropharyngeal exudate.  Eyes: Pupils are equal, round, and reactive to light. No scleral icterus.  Neck: Normal range of motion. Neck supple.  Cardiovascular: Normal rate, regular rhythm and normal heart sounds.   Pulmonary/Chest: Effort normal and breath sounds normal. No respiratory distress.  Abdominal: Soft. Bowel sounds are normal. He exhibits no distension. There is no tenderness.  OBESE  Musculoskeletal: He exhibits no edema.  Lymphadenopathy:    He has no cervical adenopathy.  Neurological: He is alert and oriented to person, place, and time.  NO FOCAL DEFICITS   Psychiatric:  SLIGHTLY ANXIOUS MOOD, NL AFFECT  Vitals reviewed.         Assessment & Plan:

## 2015-06-01 ENCOUNTER — Encounter: Payer: Self-pay | Admitting: Cardiology

## 2015-06-01 ENCOUNTER — Ambulatory Visit (INDEPENDENT_AMBULATORY_CARE_PROVIDER_SITE_OTHER): Payer: BC Managed Care – PPO | Admitting: Cardiology

## 2015-06-01 VITALS — BP 108/67 | HR 74 | Ht 76.0 in | Wt 312.2 lb

## 2015-06-01 DIAGNOSIS — E782 Mixed hyperlipidemia: Secondary | ICD-10-CM | POA: Diagnosis not present

## 2015-06-01 DIAGNOSIS — I10 Essential (primary) hypertension: Secondary | ICD-10-CM

## 2015-06-01 DIAGNOSIS — I2510 Atherosclerotic heart disease of native coronary artery without angina pectoris: Secondary | ICD-10-CM

## 2015-06-01 DIAGNOSIS — E1159 Type 2 diabetes mellitus with other circulatory complications: Secondary | ICD-10-CM

## 2015-06-01 MED ORDER — NITROGLYCERIN 0.4 MG SL SUBL: 0.4000 mg | SUBLINGUAL_TABLET | SUBLINGUAL | Status: DC | PRN

## 2015-06-01 NOTE — Progress Notes (Signed)
Cardiology Office Note  Date: 06/01/2015   [name], DOB 1955/05/29, MRN [id number]  PCP: Alonza Bogus, MD  Primary Cardiologist: Rozann Lesches, MD   Chief Complaint  Patient presents with  . Coronary Artery Disease    History of Present Illness: Kelly [name] is a 60 y.o. male last seen in December 2015. He presents for a routine follow-up visit. From a cardiac perspective he reports no angina symptoms since I last saw him. He has not had to use any nitroglycerin. He reports NYHA class II dyspnea with typical activities.  We reviewed his medications. Cardiac regimen includes aspirin, Imdur, Cozaar, Lopressor, Aldactone, and Zocor. His last lipid panel with Dr. Luan Pulling is outlined below showing aggressive LDL control. ECG today shows sinus rhythm with prolonged PR interval, incomplete right bundle branch block, and left anterior fascicular block.  He has been following recently with Dr. Oneida Alar and has nonalcoholic cirrhosis. He is trying to work on weight loss.  We discussed his last stress test from 2013. Plan at this time is to continue observation for any accelerating symptoms, however expect that we will do a follow-up screening ischemic assessment within the next year or so.  Past Medical History  Diagnosis Date  . Type 2 diabetes mellitus ([place])   . Coronary atherosclerosis of native coronary artery     BMS LAD and PTCA RCA 2000,BMS RCA/PLA 2001  . Hyperlipidemia   . GERD (gastroesophageal reflux disease)   . Lupus anticoagulant positive   . Gastric ulcer   . Diabetes mellitus   . HTN (hypertension)   . Anxiety   . Arthritis   . MI (myocardial infarction) ([place]) 2001  . Low-grade NHL (non-Hodgkin's lymphoma)     Past Surgical History  Procedure Laterality Date  . Inguinal lymph node biopsy      right inguinal lymph node biopsy  . Back surgery      multiple  . Neck surgery    . Axillary abscess      left incision and drainage left  . Dental  extractions    . Colonoscopy  June 2008    Dr. Dellis Filbert Medoff: Mild left colonic diverticulosis  . Esophagogastroduodenoscopy  June 2008    Dr. Dellis Filbert Medoff: Gastric ulcer, antral, biopsy with reactive changes associated glandular atrophy, no H pylori. No features of lymphoma. Likely NSAID related  . Shoulder arthroscopy Left   . Cardiac stents      x5 stents  . Esophagogastroduodenoscopy  August 2008    Dr. Richmond Campbell: Complete healing of gastric ulcers.  . Esophagogastroduodenoscopy (egd) with propofol N/A 08/15/2014    SLF: 1. Abnormal pain due to ulcers & gastriitis   . Esophageal biopsy  08/15/2014    Procedure: BIOPSY;  Surgeon: Danie Binder, MD;  Location: [facility];  Service: Endoscopy;;  Gastric  . Esophagogastroduodenoscopy (egd) with propofol N/A 01/19/2015    SLF: 1. Persistant ulcer with firm base 2. single gastric polyp found in the gastric antrum. Ulcer base   . Polypectomy  01/19/2015    Procedure: POLYPECTOMY (GASTRIC);  Surgeon: Danie Binder, MD;  [facility]: [facility];  Service: Endoscopy;;  . Esophageal biopsy  01/19/2015    Procedure: BIOPSY (GASTRIC ULCER);  Surgeon: Danie Binder, MD;  Location: [facility];  Service: Endoscopy;;  . Eus N/A 03/21/2015    Procedure: ESOPHAGEAL ENDOSCOPIC ULTRASOUND (EUS) RADIAL;  Surgeon: Arta Silence, MD;  [facility];  Service: Endoscopy;  Laterality: N/A;  Current Outpatient Prescriptions  Medication Sig Dispense Refill  . aspirin EC 81 MG tablet Take 81 mg by mouth daily.    . cyclobenzaprine (FLEXERIL) 10 MG tablet Take 10 mg by mouth 3 (three) times daily as needed for muscle spasms.    Marland Kitchen FLUoxetine (PROZAC) 20 MG capsule Take 20 mg by mouth daily.    Marland Kitchen gabapentin (NEURONTIN) 300 MG capsule Take 300 mg by mouth at bedtime.    . insulin glargine (LANTUS) 100 UNIT/ML injection Inject 70 Units into the skin at bedtime.    . isosorbide mononitrate (IMDUR) 30 MG 24 hr tablet TAKE 1 TABLET BY MOUTH EVERY DAY 30 tablet 6  .  losartan (COZAAR) 100 MG tablet Take 100 mg by mouth daily.    . metFORMIN (GLUCOPHAGE) 500 MG tablet Take 1,000 mg by mouth 2 (two) times daily with a meal.      . metoprolol (LOPRESSOR) 50 MG tablet Take 25 mg by mouth 2 (two) times daily.      . nitroGLYCERIN (NITROSTAT) 0.4 MG SL tablet Place 1 tablet (0.4 mg total) under the tongue every 5 (five) minutes as needed. 25 tablet 3  . NOVOLOG FLEXPEN 100 UNIT/ML FlexPen ADMINISTER 15 TO 21 UNITS UNDER THE SKIN THREE TIMES DAILY 30 mL 2  . oxyCODONE-acetaminophen (PERCOCET) 10-325 MG per tablet Take 1 tablet by mouth every 6 (six) hours as needed for pain.     . pantoprazole (PROTONIX) 40 MG tablet Take 40 mg by mouth daily before breakfast.     . simvastatin (ZOCOR) 10 MG tablet TAKE 1 TABLET BY MOUTH EVERY NIGHT AT BEDTIME 30 tablet 2  . spironolactone (ALDACTONE) 50 MG tablet Take 1 tablet (50 mg total) by mouth daily. 30 tablet 2  . VICTOZA 18 MG/3ML SOPN ADMINISTER 1.8 MG UNDER THE SKIN EVERY DAY 9 mL 2  . zolpidem (AMBIEN) 10 MG tablet Take 10 mg by mouth at bedtime as needed for sleep.     No current facility-administered medications for this visit.   Allergies:  Review of patient's allergies indicates no known allergies.   Social History: The patient  reports that he quit smoking about 21 years ago. His smoking use included Cigarettes. He started smoking about 44 years ago. He has a 23 pack-year smoking history. He has quit using smokeless tobacco. His smokeless tobacco use included Snuff and Chew. He reports that he does not drink alcohol or use illicit drugs.   ROS:  Please see the history of present illness. Otherwise, complete review of systems is positive for reflux symptoms.  All other systems are reviewed and negative.   Physical Exam: VS:  BP 108/67 mmHg  Pulse 74  Ht 6\' 4"  (1.93 m)  Wt 312 lb 3.2 oz (141.613 kg)  BMI 38.02 kg/m2  SpO2 95%, BMI Body mass index is 38.02 kg/(m^2).  Wt Readings from Last 3 Encounters:    06/01/15 312 lb 3.2 oz (141.613 kg)  05/31/15 310 lb 3.2 oz (140.706 kg)  04/27/15 315 lb (142.883 kg)    General: Morbidly obese male, appears comfortable at rest. HEENT: Conjunctiva and lids normal, oropharynx clear. Neck: Supple, no elevated JVP or carotid bruits, no thyromegaly. Lungs: Clear to auscultation, nonlabored breathing at rest. Cardiac: Regular rate and rhythm, no S3 or significant systolic murmur, no pericardial rub. Abdomen: Obese, nontender, bowel sounds present, no guarding or rebound. Extremities: No pitting edema, distal pulses 2+. Skin: Warm and dry. Musculoskeletal: No kyphosis. Neuropsychiatric: Alert and oriented x3, affect  grossly appropriate.  ECG: Tracing from 08/09/2014 showed sinus rhythm with prolonged PR interval, left anterior fascicular block, and LVH.  Recent Labwork: 01/16/2015: ALT 22; AST 27; BUN 22*; Hemoglobin 13.3; Platelets 180; Potassium 4.1; Sodium 136 03/23/2015: Creatinine, Ser 1.29 March 2015: BUN 20, creatinine 1.1, potassium 4.5, AST 18, ALT 19, hemoglobin A1c 7.24 February 2014: Cholesterol 115, triglycerides 143, HDL 42, LDL 44  Other Studies Reviewed Today:  Lexiscan Myoview 09/18/2011: IMPRESSION: Abnormal but overall low risk Lexiscan Myoview as outlined. There were no diagnostic ST-segment changes. Perfusion imaging is consistent with an element of scar and mild periinfarct ischemia in the inferolateral wall, otherwise no definite anterior ischemia. LVEF is stable at 52% with inferior hypokinesis.  Assessment and Plan:  1. Symptomatically stable CAD status post BMS to the LAD and PTCA to the RCA in 2000 with subsequent BMS to the RCA/PLA in 2001. He underwent stress testing in 2013 as outlined above and still does not report any angina symptoms on medical therapy. Refill provided for fresh nitroglycerin bottle. We will continue observation for now.  2. History of hyperlipidemia, lipid numbers were well-controlled in October  2015 with LDL 44 on low-dose Zocor. He continues to follow with Dr. Luan Pulling.  3. History of hypertension, blood pressure is normal today on medical therapy.  4. 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 He is trying to start working more on weight loss.  5. Type 2 diabetes mellitus, keep follow-up with Dr. Luan Pulling.   Current medicines were reviewed with the patient today.   Orders Placed This Encounter  Procedures  . EKG 12-Lead    Disposition: FU with me in 1 year.   Signed, Satira Sark, MD, [facility] 06/01/2015 2:53 PM    [facility] [place], [place], [place] [address] Phone: [phone]; Fax: [phone]

## 2015-06-01 NOTE — Patient Instructions (Signed)

## 2015-06-09 LAB — HEPATITIS B SURFACE ANTIBODY,QUALITATIVE: HEP B S AB: NEGATIVE

## 2015-06-09 LAB — HEPATITIS B SURFACE ANTIGEN: HEP B S AG: NEGATIVE

## 2015-06-09 LAB — HEPATITIS C ANTIBODY: HCV AB: NEGATIVE

## 2015-06-09 LAB — HEPATITIS A ANTIBODY, TOTAL: HEP A TOTAL AB: REACTIVE — AB

## 2015-06-11 LAB — AFP TUMOR MARKER: AFP TUMOR MARKER: 1.3 ng/mL (ref <6.1)

## 2015-06-20 DIAGNOSIS — Z8672 Personal history of thrombophlebitis: Secondary | ICD-10-CM | POA: Insufficient documentation

## 2015-06-20 DIAGNOSIS — Z8619 Personal history of other infectious and parasitic diseases: Secondary | ICD-10-CM | POA: Insufficient documentation

## 2015-06-21 ENCOUNTER — Telehealth: Payer: Self-pay | Admitting: Gastroenterology

## 2015-06-21 NOTE — Telephone Encounter (Signed)
PLEASE CALL PT. HIS TUMOR MARKER IS NEGATIVE. HE IS IMMUNE TO HEPATITIS A, BUT HE NEEDS THE HEPATITIS B VACCINE. HE CAN GET THAT FROM HIS PRIMARY DOCTOR.

## 2015-06-22 NOTE — Telephone Encounter (Signed)
Pt is aware. He requested that I mail this info to him. Putting in the mail today.

## 2015-06-25 ENCOUNTER — Other Ambulatory Visit: Payer: Self-pay | Admitting: Cardiology

## 2015-07-19 ENCOUNTER — Other Ambulatory Visit: Payer: Self-pay | Admitting: "Endocrinology

## 2015-07-20 LAB — BASIC METABOLIC PANEL
BUN: 15 mg/dL (ref 7–25)
CALCIUM: 8.5 mg/dL — AB (ref 8.6–10.3)
CO2: 26 mmol/L (ref 20–31)
Chloride: 102 mmol/L (ref 98–110)
Creat: 1.13 mg/dL (ref 0.70–1.33)
GLUCOSE: 182 mg/dL — AB (ref 65–99)
Potassium: 4.5 mmol/L (ref 3.5–5.3)
SODIUM: 137 mmol/L (ref 135–146)

## 2015-07-20 LAB — HEMOGLOBIN A1C
HEMOGLOBIN A1C: 7.4 % — AB (ref <5.7)
MEAN PLASMA GLUCOSE: 166 mg/dL — AB (ref <117)

## 2015-07-24 ENCOUNTER — Other Ambulatory Visit: Payer: Self-pay | Admitting: "Endocrinology

## 2015-07-27 ENCOUNTER — Ambulatory Visit (INDEPENDENT_AMBULATORY_CARE_PROVIDER_SITE_OTHER): Payer: BC Managed Care – PPO | Admitting: "Endocrinology

## 2015-07-27 ENCOUNTER — Encounter: Payer: Self-pay | Admitting: "Endocrinology

## 2015-07-27 VITALS — BP 123/80 | HR 75 | Ht 74.0 in | Wt 313.0 lb

## 2015-07-27 DIAGNOSIS — E785 Hyperlipidemia, unspecified: Secondary | ICD-10-CM | POA: Diagnosis not present

## 2015-07-27 DIAGNOSIS — E1159 Type 2 diabetes mellitus with other circulatory complications: Secondary | ICD-10-CM

## 2015-07-27 DIAGNOSIS — I10 Essential (primary) hypertension: Secondary | ICD-10-CM

## 2015-07-27 MED ORDER — BASAGLAR KWIKPEN 100 UNIT/ML ~~LOC~~ SOPN: 80.0000 [IU] | PEN_INJECTOR | Freq: Every day | SUBCUTANEOUS | Status: DC

## 2015-07-27 MED ORDER — INSULIN ASPART 100 UNIT/ML FLEXPEN: 12.0000 [IU] | PEN_INJECTOR | Freq: Three times a day (TID) | SUBCUTANEOUS | Status: DC

## 2015-07-27 NOTE — Progress Notes (Signed)
Subjective:    Patient [name], male    DOB: 1955/08/28, PCP Alonza Bogus, MD   Past Medical History  Diagnosis Date  . Type 2 diabetes mellitus ([place])   . Coronary atherosclerosis of native coronary artery     BMS LAD and PTCA RCA 2000,BMS RCA/PLA 2001  . Hyperlipidemia   . GERD (gastroesophageal reflux disease)   . Lupus anticoagulant positive   . Gastric ulcer   . Diabetes mellitus   . HTN (hypertension)   . Anxiety   . Arthritis   . MI (myocardial infarction) ([place]) 2001  . Low-grade NHL (non-Hodgkin's lymphoma)    Past Surgical History  Procedure Laterality Date  . Inguinal lymph node biopsy      right inguinal lymph node biopsy  . Back surgery      multiple  . Neck surgery    . Axillary abscess      left incision and drainage left  . Dental extractions    . Colonoscopy  June 2008    Dr. Dellis Filbert Medoff: Mild left colonic diverticulosis  . Esophagogastroduodenoscopy  June 2008    Dr. Dellis Filbert Medoff: Gastric ulcer, antral, biopsy with reactive changes associated glandular atrophy, no H pylori. No features of lymphoma. Likely NSAID related  . Shoulder arthroscopy Left   . Cardiac stents      x5 stents  . Esophagogastroduodenoscopy  August 2008    Dr. Richmond Campbell: Complete healing of gastric ulcers.  . Esophagogastroduodenoscopy (egd) with propofol N/A 08/15/2014    SLF: 1. Abnormal pain due to ulcers & gastriitis   . Biopsy  08/15/2014    Procedure: BIOPSY;  Surgeon: Danie Binder, MD;  Location: [facility];  Service: Endoscopy;;  Gastric  . Esophagogastroduodenoscopy (egd) with propofol N/A 01/19/2015    SLF: 1. Persistant ulcer with firm base 2. single gastric polyp found in the gastric antrum. Ulcer base   . Polypectomy  01/19/2015    Procedure: POLYPECTOMY (GASTRIC);  Surgeon: Danie Binder, MD;  Location: [facility];  Service: Endoscopy;;  . Biopsy  01/19/2015    Procedure: BIOPSY (GASTRIC ULCER);  Surgeon: Danie Binder, MD;  Location: [facility];   Service: Endoscopy;;  . Eus N/A 03/21/2015    Procedure: ESOPHAGEAL ENDOSCOPIC ULTRASOUND (EUS) RADIAL;  Surgeon: Arta Silence, MD;  Location: [facility];  Service: Endoscopy;  Laterality: N/A;   Social History   Social History  . Marital Status: Married    Spouse Name: N/A  . Number of Children: N/A  . Years of Education: N/A   Social History Main Topics  . Smoking status: Former Smoker -- 1.00 packs/day for 23 years    Types: Cigarettes    Start date: 10/30/1970    Quit date: 10/29/1993  . Smokeless tobacco: Former Systems developer    Types: Snuff, Chew  . Alcohol Use: No  . Drug Use: No  . Sexual Activity: No   Other Topics Concern  . None   Social History Narrative   Outpatient Encounter Prescriptions as of 07/27/2015  Medication Sig  . aspirin EC 81 MG tablet Take 81 mg by mouth daily.  . cyclobenzaprine (FLEXERIL) 10 MG tablet Take 10 mg by mouth 3 (three) times daily as needed for muscle spasms.  Marland Kitchen FLUoxetine (PROZAC) 20 MG capsule Take 20 mg by mouth daily.  Marland Kitchen gabapentin (NEURONTIN) 300 MG capsule Take 300 mg by mouth at bedtime.  . insulin aspart (NOVOLOG FLEXPEN) 100 UNIT/ML FlexPen Inject 12-18 Units into  the skin 3 (three) times daily with meals.  . isosorbide mononitrate (IMDUR) 30 MG 24 hr tablet TAKE 1 TABLET BY MOUTH EVERY DAY  . losartan (COZAAR) 100 MG tablet Take 100 mg by mouth daily.  . metFORMIN (GLUCOPHAGE) 500 MG tablet Take 1,000 mg by mouth 2 (two) times daily with a meal.    . metoprolol (LOPRESSOR) 50 MG tablet Take 25 mg by mouth 2 (two) times daily.    . nitroGLYCERIN (NITROSTAT) 0.4 MG SL tablet Place 1 tablet (0.4 mg total) under the tongue every 5 (five) minutes as needed.  Marland Kitchen oxyCODONE-acetaminophen (PERCOCET) 10-325 MG per tablet Take 1 tablet by mouth every 6 (six) hours as needed for pain.   . pantoprazole (PROTONIX) 40 MG tablet Take 40 mg by mouth daily before breakfast.   . simvastatin (ZOCOR) 10 MG tablet TAKE 1 TABLET BY MOUTH EVERY NIGHT AT  BEDTIME  . spironolactone (ALDACTONE) 50 MG tablet Take 1 tablet (50 mg total) by mouth daily.  Marland Kitchen VICTOZA 18 MG/3ML SOPN ADMINISTER 1.8 MG UNDER THE SKIN EVERY DAY  . zolpidem (AMBIEN) 10 MG tablet Take 10 mg by mouth at bedtime as needed for sleep.  . [DISCONTINUED] Insulin Glargine (LANTUS SOLOSTAR) 100 UNIT/ML Solostar Pen Inject 70 Units into the skin at bedtime.  . [DISCONTINUED] NOVOLOG FLEXPEN 100 UNIT/ML FlexPen ADMINISTER 15 TO 21 UNITS UNDER THE SKIN THREE TIMES DAILY  . Insulin Glargine (BASAGLAR KWIKPEN) 100 UNIT/ML SOPN Inject 0.8 mLs (80 Units total) into the skin daily at 10 pm.  . [DISCONTINUED] insulin glargine (LANTUS) 100 UNIT/ML injection Inject 70 Units into the skin at bedtime.   No facility-administered encounter medications on file as of 07/27/2015.   ALLERGIES: No Known Allergies VACCINATION STATUS:  There is no immunization history on file for this patient.  Diabetes He presents for his follow-up diabetic visit. He has type 2 diabetes mellitus. Onset time: He was diagnosed at approximate age of 27 years. His disease course has been improving. There are no hypoglycemic associated symptoms. Pertinent negatives for hypoglycemia include no confusion, headaches, pallor or seizures. There are no diabetic associated symptoms. Pertinent negatives for diabetes include no chest pain, no fatigue, no polydipsia, no polyphagia, no polyuria and no weakness. Symptoms are improving. Diabetic complications include heart disease and nephropathy. Risk factors for coronary artery disease include diabetes mellitus, dyslipidemia, hypertension, male sex, obesity, sedentary lifestyle and tobacco exposure. Current diabetic treatment includes intensive insulin program and oral agent (dual therapy). He is compliant with treatment most of the time. He is following a generally unhealthy diet. Prior visit with dietitian: Declines dietitian referral. His breakfast blood glucose range is generally 180-200  mg/dl. His lunch blood glucose range is generally 140-180 mg/dl. His dinner blood glucose range is generally 140-180 mg/dl. His overall blood glucose range is 140-180 mg/dl. An ACE inhibitor/angiotensin II receptor blocker is being taken.  Hyperlipidemia This is a chronic problem. The current episode started more than 1 year ago. Exacerbating diseases include diabetes and obesity. Pertinent negatives include no chest pain, myalgias or shortness of breath. Current antihyperlipidemic treatment includes statins. Risk factors for coronary artery disease include diabetes mellitus, dyslipidemia, hypertension, male sex and a sedentary lifestyle.  Hypertension This is a chronic problem. The current episode started more than 1 year ago. The problem is controlled. Pertinent negatives include no chest pain, headaches, neck pain, palpitations or shortness of breath. Past treatments include angiotensin blockers. The current treatment provides moderate improvement. Hypertensive end-organ damage includes CAD/MI.  Review of Systems  Constitutional: Negative for fatigue and unexpected weight change.  HENT: Negative for dental problem, mouth sores and trouble swallowing.   Eyes: Negative for visual disturbance.  Respiratory: Negative for cough, choking, chest tightness, shortness of breath and wheezing.   Cardiovascular: Negative for chest pain, palpitations and leg swelling.  Gastrointestinal: Negative for nausea, vomiting, abdominal pain, diarrhea, constipation and abdominal distention.  Endocrine: Negative for polydipsia, polyphagia and polyuria.  Genitourinary: Negative for dysuria, urgency, hematuria and flank pain.  Musculoskeletal: Negative for myalgias, back pain, gait problem and neck pain.  Skin: Negative for pallor, rash and wound.  Neurological: Negative for seizures, syncope, weakness, numbness and headaches.  Psychiatric/Behavioral: Negative.  Negative for confusion and dysphoric mood.     Objective:    BP 123/80 mmHg  Pulse 75  Ht 6\' 2"  (1.88 m)  Wt 313 lb (141.976 kg)  BMI 40.17 kg/m2  SpO2 95%  Wt Readings from Last 3 Encounters:  07/27/15 313 lb (141.976 kg)  06/01/15 312 lb 3.2 oz (141.613 kg)  05/31/15 310 lb 3.2 oz (140.706 kg)    Physical Exam  Constitutional: He is oriented to person, place, and time. He appears well-developed and well-nourished. He is cooperative. No distress.  HENT:  Head: Normocephalic and atraumatic.  Eyes: EOM are normal.  Neck: Normal range of motion. Neck supple. No tracheal deviation present. No thyromegaly present.  Cardiovascular: Normal rate, S1 normal, S2 normal and normal heart sounds.  Exam reveals no gallop.   No murmur heard. Pulses:      Dorsalis pedis pulses are 1+ on the right side, and 1+ on the left side.       Posterior tibial pulses are 1+ on the right side, and 1+ on the left side.  Pulmonary/Chest: Breath sounds normal. No respiratory distress. He has no wheezes.  Abdominal: Soft. Bowel sounds are normal. He exhibits no distension. There is no tenderness. There is no guarding and no CVA tenderness.  Musculoskeletal: He exhibits no edema.       Right shoulder: He exhibits no swelling and no deformity.  Neurological: He is alert and oriented to person, place, and time. He has normal strength and normal reflexes. No cranial nerve deficit or sensory deficit. Gait normal.  Skin: Skin is warm and dry. No rash noted. No cyanosis. Nails show no clubbing.  Psychiatric: He has a normal mood and affect. His speech is normal and behavior is normal. Judgment and thought content normal. Cognition and memory are normal.    Results for orders placed or performed in visit on [id number]  Basic metabolic panel  Result Value Ref Range   Sodium 137 135 - 146 mmol/L   Potassium 4.5 3.5 - 5.3 mmol/L   Chloride 102 98 - 110 mmol/L   CO2 26 20 - 31 mmol/L   Glucose, Bld 182 (H) 65 - 99 mg/dL   BUN 15 7 - 25 mg/dL   Creat 1.13 0.70  - 1.33 mg/dL   Calcium 8.5 (L) 8.6 - 10.3 mg/dL  Hemoglobin A1c  Result Value Ref Range   Hgb A1c MFr Bld 7.4 (H) <5.7 %   Mean Plasma Glucose 166 (H) <117 mg/dL   Diabetic Labs (most recent): Lab Results  Component Value Date   HGBA1C 7.4* 07/19/2015   HGBA1C 7.9* 04/16/2015   HGBA1C * 06/30/2009    9.7 (NOTE) The ADA recommends the following therapeutic goal for glycemic control related to Hgb A1c measurement: Goal of therapy: <6.5 Hgb A1c  Reference: American Diabetes Association: Clinical Practice Recommendations 2010, Diabetes Care, 2010, 33: (Suppl  1).   Lipid Panel     Component Value Date/Time   CHOL 116 06/28/2010 2249   TRIG 122 06/28/2010 2249   HDL 41 06/28/2010 2249   CHOLHDL 2.8 Ratio 06/28/2010 2249   VLDL 24 06/28/2010 2249   LDLCALC 51 06/28/2010 2249       Assessment & Plan:   1. Type 2 diabetes mellitus with vascular disease (H[facility])  -His diabetes is  complicated by coronary artery disease and patient remains at a high risk for more acute and chronic complications of diabetes which include CAD, CVA, CKD, retinopathy, and neuropathy. These are all discussed in detail with the patient.  Patient came with improved glucose profile, and  recent A1c of 7.4 %.  Glucose logs and insulin administration records pertaining to this visit,  to be scanned into patient's records.  Recent labs reviewed.   - I have re-counseled the patient on diet management and weight loss  by adopting a carbohydrate restricted / protein rich  Diet.  - Suggestion is made for patient to avoid simple carbohydrates   from their diet including Cakes , Desserts, Ice Cream,  Soda (  diet and regular) , Sweet Tea , Candies,  Chips, Cookies, Artificial Sweeteners,   and "Sugar-free" Products .  This will help patient to have stable blood glucose profile and potentially avoid unintended  Weight gain.  - Patient is advised to stick to a routine mealtimes to eat 3 meals  a day and avoid unnecessary  snacks ( to snack only to correct hypoglycemia).  - The patient  Declines it with CDE.  - I have approached patient with the following individualized plan to manage diabetes and patient agrees.  -Increase basal insulin Basaglar to 80 units qhs, and continue Novolog 12 units TIDAC for pre-meal BG readings of 90-150mg /dl, plus patient specific correction dose of rapid acting insulin for unexpected hyperglycemia above 150mg /dl, associated with strict monitoring of BG AC and HS.  -Adjustment parameters for hypo and hyperglycemia were given in a written document to patient. -Patient is encouraged to call clinic for blood glucose levels less than 70 or above 300 mg /dl.  I will continue MTF 1000mg  po BID, and continue  Victoza  1.8mg  sq daily to advance as tolerated.   - Patient specific target  for A1c; LDL, HDL, Triglycerides, and  Waist Circumference were discussed in detail.  2) BP/HTN: Controlled . Continue current medications including ACEI/ARB. 3) Lipids/HPL:  continue statins. 4)  Weight/Diet: CDE consult in progress, exercise, and carbohydrates information provided.  5) Chronic Care/Health Maintenance:  -Patient is on ACEI/ARB and Statin medications and encouraged to continue to follow up with Ophthalmology, Podiatrist at least yearly or according to recommendations, and advised to  stay away from smoking. I have recommended yearly flu vaccine and pneumonia vaccination at least every 5 years; moderate intensity exercise for up to 150 minutes weekly; and  sleep for at least 7 hours a day.  - 25 minutes of time was spent on the care of this patient , 50% of which was applied for counseling on diabetes complications and their preventions.  - I advised patient to maintain close follow up with HAWKINS,EDWARD L, MD for primary care needs.  Patient is asked to bring meter and  blood glucose logs during their next visit.    Follow up plan: -Return in about 3 months (around 10/27/2015) for  diabetes, high blood  pressure, high cholesterol, follow up with pre-visit labs, meter, and logs.  Glade Lloyd, MD Phone: [phone]  Fax: [phone]   07/27/2015, 4:51 PM

## 2015-07-27 NOTE — Patient Instructions (Signed)

## 2015-08-08 ENCOUNTER — Other Ambulatory Visit (HOSPITAL_COMMUNITY): Payer: Self-pay | Admitting: Emergency Medicine

## 2015-08-08 DIAGNOSIS — R609 Edema, unspecified: Secondary | ICD-10-CM

## 2015-08-08 MED ORDER — SPIRONOLACTONE 50 MG PO TABS: 50.0000 mg | ORAL_TABLET | Freq: Every day | ORAL | Status: DC

## 2015-08-14 ENCOUNTER — Other Ambulatory Visit: Payer: Self-pay | Admitting: Gastroenterology

## 2015-08-30 ENCOUNTER — Other Ambulatory Visit: Payer: Self-pay | Admitting: "Endocrinology

## 2015-10-20 ENCOUNTER — Other Ambulatory Visit: Payer: Self-pay | Admitting: "Endocrinology

## 2015-10-25 ENCOUNTER — Other Ambulatory Visit: Payer: Self-pay | Admitting: Cardiology

## 2015-10-29 ENCOUNTER — Other Ambulatory Visit: Payer: Self-pay | Admitting: "Endocrinology

## 2015-10-30 LAB — BASIC METABOLIC PANEL
BUN: 19 mg/dL (ref 7–25)
CALCIUM: 9.2 mg/dL (ref 8.6–10.3)
CO2: 27 mmol/L (ref 20–31)
Chloride: 98 mmol/L (ref 98–110)
Creat: 1.12 mg/dL (ref 0.70–1.33)
GLUCOSE: 160 mg/dL — AB (ref 65–99)
POTASSIUM: 4.2 mmol/L (ref 3.5–5.3)
Sodium: 134 mmol/L — ABNORMAL LOW (ref 135–146)

## 2015-10-30 LAB — HEMOGLOBIN A1C
HEMOGLOBIN A1C: 7.9 % — AB (ref <5.7)
Mean Plasma Glucose: 180 mg/dL

## 2015-11-05 ENCOUNTER — Other Ambulatory Visit: Payer: Self-pay

## 2015-11-05 MED ORDER — GLUCOSE BLOOD VI STRP: ORAL_STRIP | Status: DC

## 2015-11-09 ENCOUNTER — Ambulatory Visit (INDEPENDENT_AMBULATORY_CARE_PROVIDER_SITE_OTHER): Payer: BC Managed Care – PPO | Admitting: "Endocrinology

## 2015-11-09 ENCOUNTER — Encounter: Payer: Self-pay | Admitting: "Endocrinology

## 2015-11-09 VITALS — BP 128/73 | HR 72 | Ht 74.0 in | Wt 324.0 lb

## 2015-11-09 DIAGNOSIS — I10 Essential (primary) hypertension: Secondary | ICD-10-CM | POA: Diagnosis not present

## 2015-11-09 DIAGNOSIS — E785 Hyperlipidemia, unspecified: Secondary | ICD-10-CM

## 2015-11-09 DIAGNOSIS — E1159 Type 2 diabetes mellitus with other circulatory complications: Secondary | ICD-10-CM | POA: Diagnosis not present

## 2015-11-09 NOTE — Progress Notes (Signed)
Subjective:    Patient [name], male    DOB: 12/27/1955, PCP Alonza Bogus, MD   Past Medical History  Diagnosis Date  . Type 2 diabetes mellitus ([place])   . Coronary atherosclerosis of native coronary artery     BMS LAD and PTCA RCA 2000,BMS RCA/PLA 2001  . Hyperlipidemia   . GERD (gastroesophageal reflux disease)   . Lupus anticoagulant positive   . Gastric ulcer   . Diabetes mellitus   . HTN (hypertension)   . Anxiety   . Arthritis   . MI (myocardial infarction) ([place]) 2001  . Low-grade NHL (non-Hodgkin's lymphoma)    Past Surgical History  Procedure Laterality Date  . Inguinal lymph node biopsy      right inguinal lymph node biopsy  . Back surgery      multiple  . Neck surgery    . Axillary abscess      left incision and drainage left  . Dental extractions    . Colonoscopy  June 2008    Dr. Dellis Filbert Medoff: Mild left colonic diverticulosis  . Esophagogastroduodenoscopy  June 2008    Dr. Dellis Filbert Medoff: Gastric ulcer, antral, biopsy with reactive changes associated glandular atrophy, no H pylori. No features of lymphoma. Likely NSAID related  . Shoulder arthroscopy Left   . Cardiac stents      x5 stents  . Esophagogastroduodenoscopy  August 2008    Dr. Richmond Campbell: Complete healing of gastric ulcers.  . Esophagogastroduodenoscopy (egd) with propofol N/A 08/15/2014    SLF: 1. Abnormal pain due to ulcers & gastriitis   . Biopsy  08/15/2014    Procedure: BIOPSY;  Surgeon: Danie Binder, MD;  Location: [facility];  Service: Endoscopy;;  Gastric  . Esophagogastroduodenoscopy (egd) with propofol N/A 01/19/2015    SLF: 1. Persistant ulcer with firm base 2. single gastric polyp found in the gastric antrum. Ulcer base   . Polypectomy  01/19/2015    Procedure: POLYPECTOMY (GASTRIC);  Surgeon: Danie Binder, MD;  Location: [facility];  Service: Endoscopy;;  . Biopsy  01/19/2015    Procedure: BIOPSY (GASTRIC ULCER);  Surgeon: Danie Binder, MD;  Location: [facility];   Service: Endoscopy;;  . Eus N/A 03/21/2015    Procedure: ESOPHAGEAL ENDOSCOPIC ULTRASOUND (EUS) RADIAL;  Surgeon: Arta Silence, MD;  Location: [facility];  Service: Endoscopy;  Laterality: N/A;   Social History   Social History  . Marital Status: Married    Spouse Name: N/A  . Number of Children: N/A  . Years of Education: N/A   Social History Main Topics  . Smoking status: Former Smoker -- 1.00 packs/day for 23 years    Types: Cigarettes    Start date: 10/30/1970    Quit date: 10/29/1993  . Smokeless tobacco: Former Systems developer    Types: Snuff, Chew  . Alcohol Use: No  . Drug Use: No  . Sexual Activity: No   Other Topics Concern  . None   Social History Narrative   Outpatient Encounter Prescriptions as of 11/09/2015  Medication Sig  . Insulin Aspart (NOVOLOG FLEXPEN [place]) Inject 15-21 Units into the skin 3 (three) times daily with meals.  Marland Kitchen aspirin EC 81 MG tablet Take 81 mg by mouth daily.  . cyclobenzaprine (FLEXERIL) 10 MG tablet Take 10 mg by mouth 3 (three) times daily as needed for muscle spasms.  Marland Kitchen FLUoxetine (PROZAC) 20 MG capsule Take 20 mg by mouth daily.  Marland Kitchen gabapentin (NEURONTIN) 300 MG capsule Take  300 mg by mouth at bedtime.  Marland Kitchen glucose blood (ONETOUCH VERIO) test strip Use as instructed 4 x daily. E11.65  . Insulin Glargine (BASAGLAR KWIKPEN) 100 UNIT/ML SOPN Inject 0.8 mLs (80 Units total) into the skin daily at 10 pm.  . isosorbide mononitrate (IMDUR) 30 MG 24 hr tablet TAKE 1 TABLET BY MOUTH EVERY DAY  . losartan (COZAAR) 100 MG tablet Take 100 mg by mouth daily.  . metFORMIN (GLUCOPHAGE) 500 MG tablet Take 1,000 mg by mouth 2 (two) times daily with a meal.    . metoprolol (LOPRESSOR) 50 MG tablet Take 25 mg by mouth 2 (two) times daily.    . nitroGLYCERIN (NITROSTAT) 0.4 MG SL tablet Place 1 tablet (0.4 mg total) under the tongue every 5 (five) minutes as needed.  Marland Kitchen oxyCODONE-acetaminophen (PERCOCET) 10-325 MG per tablet Take 1 tablet by mouth every 6 (six) hours  as needed for pain.   . pantoprazole (PROTONIX) 40 MG tablet Take 40 mg by mouth daily before breakfast.   . pantoprazole (PROTONIX) 40 MG tablet TAKE 1 TABLET BY MOUTH BEFORE BREAKFAST  . simvastatin (ZOCOR) 10 MG tablet TAKE 1 TABLET BY MOUTH EVERY NIGHT AT BEDTIME  . spironolactone (ALDACTONE) 50 MG tablet Take 1 tablet (50 mg total) by mouth daily.  Marland Kitchen VICTOZA 18 MG/3ML SOPN ADMINISTER 1.8 MG UNDER THE SKIN EVERY DAY  . zolpidem (AMBIEN) 10 MG tablet Take 10 mg by mouth at bedtime as needed for sleep.  . [DISCONTINUED] insulin aspart (NOVOLOG FLEXPEN) 100 UNIT/ML FlexPen Inject 12-18 units TIDAC   No facility-administered encounter medications on file as of 11/09/2015.   ALLERGIES: No Known Allergies VACCINATION STATUS:  There is no immunization history on file for this patient.  Diabetes He presents for his follow-up diabetic visit. He has type 2 diabetes mellitus. Onset time: He was diagnosed at approximate age of 59 years. His disease course has been worsening. There are no hypoglycemic associated symptoms. Pertinent negatives for hypoglycemia include no confusion, headaches, pallor or seizures. There are no diabetic associated symptoms. Pertinent negatives for diabetes include no chest pain, no fatigue, no polydipsia, no polyphagia, no polyuria and no weakness. Symptoms are worsening. Diabetic complications include heart disease and nephropathy. Risk factors for coronary artery disease include diabetes mellitus, dyslipidemia, hypertension, male sex, obesity, sedentary lifestyle and tobacco exposure. Current diabetic treatment includes intensive insulin program and oral agent (dual therapy). He is compliant with treatment most of the time. His weight is increasing steadily. He is following a generally unhealthy diet. When asked about meal planning, he reported none. Prior visit with dietitian: Declines dietitian referral. His breakfast blood glucose range is generally 140-180 mg/dl. His lunch  blood glucose range is generally 140-180 mg/dl. His dinner blood glucose range is generally 140-180 mg/dl. His overall blood glucose range is 140-180 mg/dl. An ACE inhibitor/angiotensin II receptor blocker is being taken.  Hyperlipidemia This is a chronic problem. The current episode started more than 1 year ago. Exacerbating diseases include diabetes and obesity. Pertinent negatives include no chest pain, myalgias or shortness of breath. Current antihyperlipidemic treatment includes statins. Risk factors for coronary artery disease include diabetes mellitus, dyslipidemia, hypertension, male sex and a sedentary lifestyle.  Hypertension This is a chronic problem. The current episode started more than 1 year ago. The problem is controlled. Pertinent negatives include no chest pain, headaches, neck pain, palpitations or shortness of breath. Past treatments include angiotensin blockers. The current treatment provides moderate improvement. Hypertensive end-organ damage includes CAD/MI.  Review of Systems  Constitutional: Negative for fatigue and unexpected weight change.  HENT: Negative for dental problem, mouth sores and trouble swallowing.   Eyes: Negative for visual disturbance.  Respiratory: Negative for cough, choking, chest tightness, shortness of breath and wheezing.   Cardiovascular: Negative for chest pain, palpitations and leg swelling.  Gastrointestinal: Negative for nausea, vomiting, abdominal pain, diarrhea, constipation and abdominal distention.  Endocrine: Negative for polydipsia, polyphagia and polyuria.  Genitourinary: Negative for dysuria, urgency, hematuria and flank pain.  Musculoskeletal: Negative for myalgias, back pain, gait problem and neck pain.  Skin: Negative for pallor, rash and wound.  Neurological: Negative for seizures, syncope, weakness, numbness and headaches.  Psychiatric/Behavioral: Negative.  Negative for confusion and dysphoric mood.    Objective:    BP  128/73 mmHg  Pulse 72  Ht 6\' 2"  (1.88 m)  Wt 324 lb (146.965 kg)  BMI 41.58 kg/m2  Wt Readings from Last 3 Encounters:  11/09/15 324 lb (146.965 kg)  07/27/15 313 lb (141.976 kg)  06/01/15 312 lb 3.2 oz (141.613 kg)    Physical Exam  Constitutional: He is oriented to person, place, and time. He appears well-developed and well-nourished. He is cooperative. No distress.  HENT:  Head: Normocephalic and atraumatic.  Eyes: EOM are normal.  Neck: Normal range of motion. Neck supple. No tracheal deviation present. No thyromegaly present.  Cardiovascular: Normal rate, S1 normal, S2 normal and normal heart sounds.  Exam reveals no gallop.   No murmur heard. Pulses:      Dorsalis pedis pulses are 1+ on the right side, and 1+ on the left side.       Posterior tibial pulses are 1+ on the right side, and 1+ on the left side.  Pulmonary/Chest: Breath sounds normal. No respiratory distress. He has no wheezes.  Abdominal: Soft. Bowel sounds are normal. He exhibits no distension. There is no tenderness. There is no guarding and no CVA tenderness.  Musculoskeletal: He exhibits no edema.       Right shoulder: He exhibits no swelling and no deformity.  Neurological: He is alert and oriented to person, place, and time. He has normal strength and normal reflexes. No cranial nerve deficit or sensory deficit. Gait normal.  Skin: Skin is warm and dry. No rash noted. No cyanosis. Nails show no clubbing.  Psychiatric: He has a normal mood and affect. His speech is normal and behavior is normal. Judgment and thought content normal. Cognition and memory are normal.    Results for orders placed or performed in visit on [id number]  Basic metabolic panel  Result Value Ref Range   Sodium 134 (L) 135 - 146 mmol/L   Potassium 4.2 3.5 - 5.3 mmol/L   Chloride 98 98 - 110 mmol/L   CO2 27 20 - 31 mmol/L   Glucose, Bld 160 (H) 65 - 99 mg/dL   BUN 19 7 - 25 mg/dL   Creat 1.12 0.70 - 1.33 mg/dL   Calcium 9.2 8.6 - 10.3  mg/dL  Hemoglobin A1c  Result Value Ref Range   Hgb A1c MFr Bld 7.9 (H) <5.7 %   Mean Plasma Glucose 180 mg/dL   Diabetic Labs (most recent): Lab Results  Component Value Date   HGBA1C 7.9* 10/29/2015   HGBA1C 7.4* 07/19/2015   HGBA1C 7.9* 04/16/2015   Lipid Panel     Component Value Date/Time   CHOL 116 06/28/2010 2249   TRIG 122 06/28/2010 2249   HDL 41 06/28/2010 2249   CHOLHDL 2.8  Ratio 06/28/2010 2249   VLDL 24 06/28/2010 2249   LDLCALC 51 06/28/2010 2249       Assessment & Plan:   1. Type 2 diabetes mellitus with vascular disease ([facility])  -His diabetes is  complicated by coronary artery disease and patient remains at a high risk for more acute and chronic complications of diabetes which include CAD, CVA, CKD, retinopathy, and neuropathy. These are all discussed in detail with the patient.  Patient came with higher  glucose profile, and  recent A1c of 7.9 % increasing from 7.4%.  Glucose logs and insulin administration records pertaining to this visit,  to be scanned into patient's records.  Recent labs reviewed.   - I have re-counseled the patient on diet management and weight loss  by adopting a carbohydrate restricted / protein rich  Diet.  - Suggestion is made for patient to avoid simple carbohydrates   from their diet including Cakes , Desserts, Ice Cream,  Soda (  diet and regular) , Sweet Tea , Candies,  Chips, Cookies, Artificial Sweeteners,   and "Sugar-free" Products .  This will help patient to have stable blood glucose profile and potentially avoid unintended  Weight gain.  - Patient is advised to stick to a routine mealtimes to eat 3 meals  a day and avoid unnecessary snacks ( to snack only to correct hypoglycemia).  - The patient  Declines it with CDE.  - I have approached patient with the following individualized plan to manage diabetes and patient agrees.  -Increase basal insulin Basaglar to 80 units qhs, and increase Novolog to 15 units TIDAC for  pre-meal BG readings of 90-150mg /dl, plus patient specific correction dose of rapid acting insulin for unexpected hyperglycemia above 150mg /dl, associated with strict monitoring of BG AC and HS.  -Adjustment parameters for hypo and hyperglycemia were given in a written document to patient. -Patient is encouraged to call clinic for blood glucose levels less than 70 or above 300 mg /dl.  I will continue MTF 1000mg  po BID, and continue  Victoza  1.8mg  sq daily to advance as tolerated. - I gave him a [employer] and brochure.  - Patient specific target  for A1c; LDL, HDL, Triglycerides, and  Waist Circumference were discussed in detail.  2) BP/HTN: Controlled . Continue current medications including ACEI/ARB. 3) Lipids/HPL:  continue statins. 4)  Weight/Diet: CDE consult in progress, exercise, and carbohydrates information provided.  5) Chronic Care/Health Maintenance:  -Patient is on ACEI/ARB and Statin medications and encouraged to continue to follow up with Ophthalmology, Podiatrist at least yearly or according to recommendations, and advised to  stay away from smoking. I have recommended yearly flu vaccine and pneumonia vaccination at least every 5 years; moderate intensity exercise for up to 150 minutes weekly; and  sleep for at least 7 hours a day.  - 25 minutes of time was spent on the care of this patient , 50% of which was applied for counseling on diabetes complications and their preventions.  - I advised patient to maintain close follow up with HAWKINS,EDWARD L, MD for primary care needs.  Patient is asked to bring meter and  blood glucose logs during their next visit.    Follow up plan: -Return in about 3 months (around 02/09/2016) for follow up with pre-visit labs, meter, and logs.  Glade Lloyd, MD Phone: [phone]  Fax: [phone]   11/09/2015, 9:02 AM

## 2015-11-09 NOTE — Patient Instructions (Signed)

## 2015-12-15 ENCOUNTER — Other Ambulatory Visit (HOSPITAL_COMMUNITY): Payer: Self-pay | Admitting: Hematology & Oncology

## 2015-12-15 DIAGNOSIS — R609 Edema, unspecified: Secondary | ICD-10-CM

## 2015-12-20 MED ORDER — SPIRONOLACTONE 50 MG PO TABS: 50.0000 mg | ORAL_TABLET | Freq: Every day | ORAL | 2 refills | Status: DC

## 2016-01-22 ENCOUNTER — Other Ambulatory Visit: Payer: Self-pay | Admitting: "Endocrinology

## 2016-01-28 ENCOUNTER — Other Ambulatory Visit: Payer: Self-pay | Admitting: "Endocrinology

## 2016-01-29 LAB — COMPLETE METABOLIC PANEL WITH GFR
ALBUMIN: 3.9 g/dL (ref 3.6–5.1)
ALK PHOS: 50 U/L (ref 40–115)
ALT: 27 U/L (ref 9–46)
AST: 27 U/L (ref 10–35)
BUN: 22 mg/dL (ref 7–25)
CO2: 29 mmol/L (ref 20–31)
Calcium: 9.3 mg/dL (ref 8.6–10.3)
Chloride: 99 mmol/L (ref 98–110)
Creat: 1.33 mg/dL (ref 0.70–1.33)
GFR, EST AFRICAN AMERICAN: 68 mL/min (ref >=60)
GFR, EST NON AFRICAN AMERICAN: 59 mL/min — AB (ref >=60)
GLUCOSE: 196 mg/dL — AB (ref 65–99)
POTASSIUM: 4.6 mmol/L (ref 3.5–5.3)
SODIUM: 138 mmol/L (ref 135–146)
Total Bilirubin: 0.5 mg/dL (ref 0.2–1.2)
Total Protein: 6.7 g/dL (ref 6.1–8.1)

## 2016-01-29 LAB — HEMOGLOBIN A1C
Hgb A1c MFr Bld: 7 % — ABNORMAL HIGH (ref <5.7)
MEAN PLASMA GLUCOSE: 154 mg/dL

## 2016-01-29 LAB — MICROALBUMIN / CREATININE URINE RATIO
Creatinine, Urine: 120 mg/dL (ref 20–370)
MICROALB UR: 1.8 mg/dL
MICROALB/CREAT RATIO: 15 ug/mg{creat} (ref <30)

## 2016-01-29 LAB — T4, FREE: FREE T4: 1.1 ng/dL (ref 0.8–1.8)

## 2016-01-29 LAB — LIPID PANEL
CHOL/HDL RATIO: 3.4 ratio (ref <=5.0)
CHOLESTEROL: 137 mg/dL (ref 125–200)
HDL: 40 mg/dL (ref >=40)
LDL Cholesterol: 41 mg/dL (ref <130)
Triglycerides: 281 mg/dL — ABNORMAL HIGH (ref <150)
VLDL: 56 mg/dL — ABNORMAL HIGH (ref <30)

## 2016-01-29 LAB — TSH: TSH: 1.35 mIU/L (ref 0.40–4.50)

## 2016-02-05 ENCOUNTER — Encounter: Payer: Self-pay | Admitting: "Endocrinology

## 2016-02-05 ENCOUNTER — Ambulatory Visit (INDEPENDENT_AMBULATORY_CARE_PROVIDER_SITE_OTHER): Payer: BC Managed Care – PPO | Admitting: "Endocrinology

## 2016-02-05 VITALS — BP 132/78 | HR 73 | Ht 74.0 in | Wt 338.0 lb

## 2016-02-05 DIAGNOSIS — E1159 Type 2 diabetes mellitus with other circulatory complications: Secondary | ICD-10-CM

## 2016-02-05 DIAGNOSIS — E785 Hyperlipidemia, unspecified: Secondary | ICD-10-CM | POA: Diagnosis not present

## 2016-02-05 DIAGNOSIS — I10 Essential (primary) hypertension: Secondary | ICD-10-CM | POA: Diagnosis not present

## 2016-02-05 NOTE — Progress Notes (Signed)
Subjective:    Patient [name], male    DOB: [birth year], PCP Alonza Bogus, MD   Past Medical History:  Diagnosis Date  . Anxiety   . Arthritis   . Coronary atherosclerosis of native coronary artery    BMS LAD and PTCA RCA 2000,BMS RCA/PLA 2001  . Diabetes mellitus   . Gastric ulcer   . GERD (gastroesophageal reflux disease)   . HTN (hypertension)   . Hyperlipidemia   . Low-grade NHL (non-Hodgkin's lymphoma)   . Lupus anticoagulant positive   . MI (myocardial infarction) ([place]) 2001  . Type 2 diabetes mellitus ([place])    Past Surgical History:  Procedure Laterality Date  . Axillary abscess     left incision and drainage left  . BACK SURGERY     multiple  . BIOPSY  08/15/2014   Procedure: BIOPSY;  Surgeon: Danie Binder, MD;  Location: [facility];  Service: Endoscopy;;  Gastric  . BIOPSY  01/19/2015   Procedure: BIOPSY (GASTRIC ULCER);  Surgeon: Danie Binder, MD;  Location: [facility];  Service: Endoscopy;;  . cardiac stents     x5 stents  . COLONOSCOPY  June 2008   Dr. Dellis Filbert Medoff: Mild left colonic diverticulosis  . Dental extractions    . ESOPHAGOGASTRODUODENOSCOPY  June 2008   Dr. Dellis Filbert Medoff: Gastric ulcer, antral, biopsy with reactive changes associated glandular atrophy, no H pylori. No features of lymphoma. Likely NSAID related  . ESOPHAGOGASTRODUODENOSCOPY  August 2008   Dr. Dellis Filbert Medoff: Complete healing of gastric ulcers.  . ESOPHAGOGASTRODUODENOSCOPY (EGD) WITH PROPOFOL N/A 08/15/2014   SLF: 1. Abnormal pain due to ulcers & gastriitis   . ESOPHAGOGASTRODUODENOSCOPY (EGD) WITH PROPOFOL N/A 01/19/2015   SLF: 1. Persistant ulcer with firm base 2. single gastric polyp found in the gastric antrum. Ulcer base   . EUS N/A 03/21/2015   Procedure: ESOPHAGEAL ENDOSCOPIC ULTRASOUND (EUS) RADIAL;  Surgeon: Arta Silence, MD;  Location: [facility];  Service: Endoscopy;  Laterality: N/A;  . Inguinal lymph node biopsy     right inguinal lymph node biopsy   . NECK SURGERY    . POLYPECTOMY  01/19/2015   Procedure: POLYPECTOMY (GASTRIC);  Surgeon: Danie Binder, MD;  Location: [facility];  Service: Endoscopy;;  . SHOULDER ARTHROSCOPY Left    Social History   Social History  . Marital status: Married    Spouse name: N/A  . Number of children: N/A  . Years of education: N/A   Social History Main Topics  . Smoking status: Former Smoker    Packs/day: 1.00    Years: 23.00    Types: Cigarettes    Start date: 10/30/1970    Quit date: 10/29/1993  . Smokeless tobacco: Former Systems developer    Types: Snuff, Chew  . Alcohol use No  . Drug use: No  . Sexual activity: No   Other Topics Concern  . None   Social History Narrative  . None   Outpatient Encounter Prescriptions as of 02/05/2016  Medication Sig  . aspirin EC 81 MG tablet Take 81 mg by mouth daily.  . cyclobenzaprine (FLEXERIL) 10 MG tablet Take 10 mg by mouth 3 (three) times daily as needed for muscle spasms.  Marland Kitchen FLUoxetine (PROZAC) 20 MG capsule Take 20 mg by mouth daily.  Marland Kitchen gabapentin (NEURONTIN) 300 MG capsule Take 300 mg by mouth at bedtime.  Marland Kitchen glucose blood (ONETOUCH VERIO) test strip Use as instructed 4 x daily. E11.65  . Insulin Aspart (NOVOLOG  FLEXPEN [place]) Inject 15-21 Units into the skin 3 (three) times daily with meals.  . Insulin Glargine (BASAGLAR KWIKPEN) 100 UNIT/ML SOPN INJECT 80 UNITS INTO THE SKIN DAILY AT 10 PM  . isosorbide mononitrate (IMDUR) 30 MG 24 hr tablet TAKE 1 TABLET BY MOUTH EVERY DAY  . losartan (COZAAR) 100 MG tablet Take 100 mg by mouth daily.  . metFORMIN (GLUCOPHAGE) 500 MG tablet Take 1,000 mg by mouth 2 (two) times daily with a meal.    . metoprolol (LOPRESSOR) 50 MG tablet Take 25 mg by mouth 2 (two) times daily.    . nitroGLYCERIN (NITROSTAT) 0.4 MG SL tablet Place 1 tablet (0.4 mg total) under the tongue every 5 (five) minutes as needed.  Marland Kitchen oxyCODONE-acetaminophen (PERCOCET) 10-325 MG per tablet Take 1 tablet by mouth every 6 (six) hours as needed for pain.    . pantoprazole (PROTONIX) 40 MG tablet Take 40 mg by mouth daily before breakfast.   . pantoprazole (PROTONIX) 40 MG tablet TAKE 1 TABLET BY MOUTH BEFORE BREAKFAST  . simvastatin (ZOCOR) 10 MG tablet TAKE 1 TABLET BY MOUTH EVERY NIGHT AT BEDTIME  . spironolactone (ALDACTONE) 50 MG tablet Take 1 tablet (50 mg total) by mouth daily.  Marland Kitchen VICTOZA 18 MG/3ML SOPN ADMINISTER 1.8 MG UNDER THE SKIN EVERY DAY  . zolpidem (AMBIEN) 10 MG tablet Take 10 mg by mouth at bedtime as needed for sleep.   No facility-administered encounter medications on file as of 02/05/2016.    ALLERGIES: No Known Allergies VACCINATION STATUS:  There is no immunization history on file for this patient.  Diabetes  He presents for his follow-up diabetic visit. He has type 2 diabetes mellitus. Onset time: He was diagnosed at approximate age of 78 years. His disease course has been improving. There are no hypoglycemic associated symptoms. Pertinent negatives for hypoglycemia include no confusion, headaches, pallor or seizures. There are no diabetic associated symptoms. Pertinent negatives for diabetes include no chest pain, no fatigue, no polydipsia, no polyphagia, no polyuria and no weakness. Symptoms are improving. Diabetic complications include heart disease and nephropathy. Risk factors for coronary artery disease include diabetes mellitus, dyslipidemia, hypertension, male sex, obesity, sedentary lifestyle and tobacco exposure. Current diabetic treatment includes intensive insulin program and oral agent (dual therapy). He is compliant with treatment most of the time. His weight is increasing steadily. He is following a generally unhealthy diet. When asked about meal planning, he reported none. Prior visit with dietitian: Declines dietitian referral. His breakfast blood glucose range is generally 140-180 mg/dl. His lunch blood glucose range is generally 140-180 mg/dl. His dinner blood glucose range is generally 140-180 mg/dl. His  overall blood glucose range is 140-180 mg/dl. An ACE inhibitor/angiotensin II receptor blocker is being taken.  Hyperlipidemia  This is a chronic problem. The current episode started more than 1 year ago. Exacerbating diseases include diabetes and obesity. Pertinent negatives include no chest pain, myalgias or shortness of breath. Current antihyperlipidemic treatment includes statins. Risk factors for coronary artery disease include diabetes mellitus, dyslipidemia, hypertension, male sex and a sedentary lifestyle.  Hypertension  This is a chronic problem. The current episode started more than 1 year ago. The problem is controlled. Pertinent negatives include no chest pain, headaches, neck pain, palpitations or shortness of breath. Past treatments include angiotensin blockers. The current treatment provides moderate improvement. Hypertensive end-organ damage includes CAD/MI.     Review of Systems  Constitutional: Negative for fatigue and unexpected weight change.  HENT: Negative for dental problem, mouth  sores and trouble swallowing.   Eyes: Negative for visual disturbance.  Respiratory: Negative for cough, choking, chest tightness, shortness of breath and wheezing.   Cardiovascular: Negative for chest pain, palpitations and leg swelling.  Gastrointestinal: Negative for abdominal distention, abdominal pain, constipation, diarrhea, nausea and vomiting.  Endocrine: Negative for polydipsia, polyphagia and polyuria.  Genitourinary: Negative for dysuria, flank pain, hematuria and urgency.  Musculoskeletal: Negative for back pain, gait problem, myalgias and neck pain.  Skin: Negative for pallor, rash and wound.  Neurological: Negative for seizures, syncope, weakness, numbness and headaches.  Psychiatric/Behavioral: Negative.  Negative for confusion and dysphoric mood.    Objective:    BP 132/78   Pulse 73   Ht 6\' 2"  (1.88 m)   Wt (!) 338 lb (153.3 kg)   BMI 43.40 kg/m   Wt Readings from Last  3 Encounters:  02/05/16 (!) 338 lb (153.3 kg)  11/09/15 (!) 324 lb (147 kg)  07/27/15 (!) 313 lb (142 kg)    Physical Exam  Constitutional: He is oriented to person, place, and time. He appears well-developed and well-nourished. He is cooperative. No distress.  HENT:  Head: Normocephalic and atraumatic.  Eyes: EOM are normal.  Neck: Normal range of motion. Neck supple. No tracheal deviation present. No thyromegaly present.  Cardiovascular: Normal rate, S1 normal, S2 normal and normal heart sounds.  Exam reveals no gallop.   No murmur heard. Pulses:      Dorsalis pedis pulses are 1+ on the right side, and 1+ on the left side.       Posterior tibial pulses are 1+ on the right side, and 1+ on the left side.  Pulmonary/Chest: Breath sounds normal. No respiratory distress. He has no wheezes.  Abdominal: Soft. Bowel sounds are normal. He exhibits no distension. There is no tenderness. There is no guarding and no CVA tenderness.  Musculoskeletal: He exhibits no edema.       Right shoulder: He exhibits no swelling and no deformity.  Neurological: He is alert and oriented to person, place, and time. He has normal strength and normal reflexes. No cranial nerve deficit or sensory deficit. Gait normal.  Skin: Skin is warm and dry. No rash noted. No cyanosis. Nails show no clubbing.  Psychiatric: He has a normal mood and affect. His speech is normal and behavior is normal. Judgment and thought content normal. Cognition and memory are normal.    Results for orders placed or performed in visit on 01/28/16  COMPLETE METABOLIC PANEL WITH GFR  Result Value Ref Range   Sodium 138 135 - 146 mmol/L   Potassium 4.6 3.5 - 5.3 mmol/L   Chloride 99 98 - 110 mmol/L   CO2 29 20 - 31 mmol/L   Glucose, Bld 196 (H) 65 - 99 mg/dL   BUN 22 7 - 25 mg/dL   Creat 1.33 0.70 - 1.33 mg/dL   Total Bilirubin 0.5 0.2 - 1.2 mg/dL   Alkaline Phosphatase 50 40 - 115 U/L   AST 27 10 - 35 U/L   ALT 27 9 - 46 U/L   Total  Protein 6.7 6.1 - 8.1 g/dL   Albumin 3.9 3.6 - 5.1 g/dL   Calcium 9.3 8.6 - 10.3 mg/dL   GFR, Est African American 68 >=60 mL/min   GFR, Est Non African American 59 (L) >=60 mL/min  Microalbumin / creatinine urine ratio  Result Value Ref Range   Creatinine, Urine 120 20 - 370 mg/dL   Microalb, Ur 1.8 Not  estab mg/dL   Microalb Creat Ratio 15 <30 mcg/mg creat  Lipid panel  Result Value Ref Range   Cholesterol 137 125 - 200 mg/dL   Triglycerides 281 (H) <150 mg/dL   HDL 40 >=40 mg/dL   Total CHOL/HDL Ratio 3.4 <=5.0 Ratio   VLDL 56 (H) <30 mg/dL   LDL Cholesterol 41 <130 mg/dL  TSH  Result Value Ref Range   TSH 1.35 0.40 - 4.50 mIU/L  T4, free  Result Value Ref Range   Free T4 1.1 0.8 - 1.8 ng/dL  Hemoglobin A1c  Result Value Ref Range   Hgb A1c MFr Bld 7.0 (H) <5.7 %   Mean Plasma Glucose 154 mg/dL   Diabetic Labs (most recent): Lab Results  Component Value Date   HGBA1C 7.0 (H) 01/28/2016   HGBA1C 7.9 (H) 10/29/2015   HGBA1C 7.4 (H) 07/19/2015   Lipid Panel     Component Value Date/Time   CHOL 137 01/28/2016 1602   TRIG 281 (H) 01/28/2016 1602   HDL 40 01/28/2016 1602   CHOLHDL 3.4 01/28/2016 1602   VLDL 56 (H) 01/28/2016 1602   LDLCALC 41 01/28/2016 1602       Assessment & Plan:   1. Type 2 diabetes mellitus with vascular disease (HCC)  -His diabetes is  complicated by coronary artery disease and patient remains at a high risk for more acute and chronic complications of diabetes which include CAD, CVA, CKD, retinopathy, and neuropathy. These are all discussed in detail with the patient.  Patient came with Controlled glucose profile, and  recent A1c of  7% improving from 7.9 %.  Glucose logs and insulin administration records pertaining to this visit,  to be scanned into patient's records.  Recent labs reviewed.   - I have re-counseled the patient on diet management and weight loss  by adopting a carbohydrate restricted / protein rich  Diet.  - Suggestion  is made for patient to avoid simple carbohydrates   from their diet including Cakes , Desserts, Ice Cream,  Soda (  diet and regular) , Sweet Tea , Candies,  Chips, Cookies, Artificial Sweeteners,   and "Sugar-free" Products .  This will help patient to have stable blood glucose profile and potentially avoid unintended  Weight gain.  - Patient is advised to stick to a routine mealtimes to eat 3 meals  a day and avoid unnecessary snacks ( to snack only to correct hypoglycemia).  - The patient  Declines it with CDE.  - I have approached patient with the following individualized plan to manage diabetes and patient agrees.  -Continue basal insulin Basaglar  80 units qhs, and Novolog  15 units TIDAC for pre-meal BG readings of 90-150mg /dl, plus patient specific correction dose of rapid acting insulin for unexpected hyperglycemia above 150mg /dl, associated with strict monitoring of BG AC and HS.  -Adjustment parameters for hypo and hyperglycemia were given in a written document to patient. -Patient is encouraged to call clinic for blood glucose levels less than 70 or above 300 mg /dl.  I will continue MTF 1000mg  po BID, and continue  Victoza  1.8mg  sq daily to advance as tolerated. - I gave him a [employer] and brochure.  - Patient specific target  for A1c; LDL, HDL, Triglycerides, and  Waist Circumference were discussed in detail.  2) BP/HTN: Controlled . Continue current medications including ACEI/ARB. 3) Lipids/HPL:  continue statins. 4)  Weight/Diet: CDE consult in progress, exercise, and carbohydrates information provided.  5) Chronic Care/Health  Maintenance:  -Patient is on ACEI/ARB and Statin medications and encouraged to continue to follow up with Ophthalmology, Podiatrist at least yearly or according to recommendations, and advised to  stay away from smoking. I have recommended yearly flu vaccine and pneumonia vaccination at least every 5 years; moderate intensity exercise for up to 150  minutes weekly; and  sleep for at least 7 hours a day.  - 25 minutes of time was spent on the care of this patient , 50% of which was applied for counseling on diabetes complications and their preventions.  - I advised patient to maintain close follow up with HAWKINS,EDWARD L, MD for primary care needs.  Patient is asked to bring meter and  blood glucose logs during their next visit.    Follow up plan: -Return in about 3 months (around 05/06/2016) for meter, and logs.  Glade Lloyd, MD Phone: [phone]  Fax: [phone]   02/05/2016, 4:18 PM

## 2016-02-05 NOTE — Patient Instructions (Signed)

## 2016-02-14 ENCOUNTER — Other Ambulatory Visit: Payer: Self-pay | Admitting: "Endocrinology

## 2016-02-15 ENCOUNTER — Ambulatory Visit: Payer: BC Managed Care – PPO | Admitting: "Endocrinology

## 2016-03-03 ENCOUNTER — Telehealth: Payer: Self-pay | Admitting: "Endocrinology

## 2016-03-03 NOTE — Telephone Encounter (Signed)
I gave Kelly [name] his number.

## 2016-03-03 NOTE — Telephone Encounter (Signed)
Patient states he has not had any success with getting in touch with anyone from [facility]. Patient states that Dr.Nida told him he would get in touch with them. Please have the rep call his cell phone number. [phone].

## 2016-03-11 ENCOUNTER — Other Ambulatory Visit (HOSPITAL_COMMUNITY): Payer: Self-pay | Admitting: Emergency Medicine

## 2016-03-11 DIAGNOSIS — R609 Edema, unspecified: Secondary | ICD-10-CM

## 2016-03-11 MED ORDER — SPIRONOLACTONE 50 MG PO TABS: 50.0000 mg | ORAL_TABLET | Freq: Every day | ORAL | 2 refills | Status: DC

## 2016-03-11 NOTE — Progress Notes (Signed)
Refilled spironolactone

## 2016-03-24 DIAGNOSIS — D696 Thrombocytopenia, unspecified: Secondary | ICD-10-CM | POA: Insufficient documentation

## 2016-03-25 ENCOUNTER — Encounter: Payer: Self-pay | Admitting: Gastroenterology

## 2016-04-04 ENCOUNTER — Ambulatory Visit (INDEPENDENT_AMBULATORY_CARE_PROVIDER_SITE_OTHER): Payer: BC Managed Care – PPO | Admitting: Gastroenterology

## 2016-04-04 ENCOUNTER — Encounter: Payer: Self-pay | Admitting: Gastroenterology

## 2016-04-04 VITALS — BP 146/73 | HR 69 | Temp 97.8°F | Ht 75.0 in | Wt 331.6 lb

## 2016-04-04 DIAGNOSIS — K7460 Unspecified cirrhosis of liver: Secondary | ICD-10-CM

## 2016-04-04 NOTE — Progress Notes (Addendum)
REVIEWED. 2015 340 lbs BMI 42. NOV 2017 331 LBS BMI 41.4. CT ABD/PLEVIS 2011/2012: NO CIRRHOSIS., [place] 2015: FATTY LIVER 2016: ??CIRRHOSIS. 2017: LIMITED EVALUATION.  Primary Care Physician:  Alonza Bogus, MD  Primary Gastroenterologist:  Barney Drain, MD   Chief Complaint  Patient presents with  . Cirrhosis    HPI:  Kelly [name] is a 60 y.o. male here for follow up of cirrhosis. Last seen in 05/2015. His oncologist, Dr. Everardo All encouraged patient to follow up. Patient has a history of non-Hodgkin's lymphoma. Followed every 6 months.  Since we last saw the patient he states he's been treated for diverticulitis twice. States he completed hepatitis B vaccination as recommended. He has chronic lower extremity edema which is controlled by wearing TED hose. Otherwise generally having regular bowel movements. No blood in the stool or melena. No abdominal pain. Denies significant upper GI symptoms. He is on Protonix daily. Last EGD in September 2016, persistent gastric ulcer with firm base. Biopsies benign. EUS with some nodularity, ulcerations, no apparent significant sized ulcers or malignancy, biopsies 2 unremarkable.  CT abdomen pelvis with contrast November 2016 with colonic diverticulosis, cirrhosis, otherwise unremarkable study.    Current Outpatient Prescriptions  Medication Sig Dispense Refill  . aspirin EC 81 MG tablet Take 81 mg by mouth daily.    . cyclobenzaprine (FLEXERIL) 10 MG tablet Take 10 mg by mouth 3 (three) times daily as needed for muscle spasms.    Marland Kitchen FLUoxetine (PROZAC) 20 MG capsule Take 20 mg by mouth daily.    Marland Kitchen gabapentin (NEURONTIN) 300 MG capsule Take 300 mg by mouth at bedtime.    Marland Kitchen glucose blood (ONETOUCH VERIO) test strip Use as instructed 4 x daily. E11.65 150 each 5  . Insulin Aspart (NOVOLOG FLEXPEN [place]) Inject 15-21 Units into the skin 3 (three) times daily with meals.    . Insulin Glargine (BASAGLAR KWIKPEN) 100 UNIT/ML SOPN INJECT 80 UNITS INTO  THE SKIN DAILY AT 10 PM 30 mL 2  . isosorbide mononitrate (IMDUR) 30 MG 24 hr tablet TAKE 1 TABLET BY MOUTH EVERY DAY 30 tablet 6  . losartan (COZAAR) 100 MG tablet Take 100 mg by mouth daily.    . metFORMIN (GLUCOPHAGE) 500 MG tablet Take 1,000 mg by mouth 2 (two) times daily with a meal.      . metoprolol (LOPRESSOR) 50 MG tablet Take 25 mg by mouth 2 (two) times daily.      . nitroGLYCERIN (NITROSTAT) 0.4 MG SL tablet Place 1 tablet (0.4 mg total) under the tongue every 5 (five) minutes as needed. 25 tablet 3  . oxyCODONE-acetaminophen (PERCOCET) 10-325 MG per tablet Take 1 tablet by mouth every 6 (six) hours as needed for pain.     . pantoprazole (PROTONIX) 40 MG tablet Take 40 mg by mouth daily before breakfast.     . pantoprazole (PROTONIX) 40 MG tablet TAKE 1 TABLET BY MOUTH BEFORE BREAKFAST 30 tablet 11  . simvastatin (ZOCOR) 10 MG tablet TAKE 1 TABLET BY MOUTH EVERY NIGHT AT BEDTIME 30 tablet 2  . spironolactone (ALDACTONE) 50 MG tablet Take 1 tablet (50 mg total) by mouth daily. 30 tablet 2  . VICTOZA 18 MG/3ML SOPN ADMINISTER 1.8 MG UNDER THE SKIN EVERY DAY 9 mL 2  . zolpidem (AMBIEN) 10 MG tablet Take 10 mg by mouth at bedtime as needed for sleep.     No current facility-administered medications for this visit.     Allergies as of 04/04/2016  . (  No Known Allergies)    Past Medical History:  Diagnosis Date  . Anxiety   . Arthritis   . Coronary atherosclerosis of native coronary artery    BMS LAD and PTCA RCA 2000,BMS RCA/PLA 2001  . Diabetes mellitus   . Gastric ulcer   . GERD (gastroesophageal reflux disease)   . HTN (hypertension)   . Hyperlipidemia   . Low-grade NHL (non-Hodgkin's lymphoma)   . Lupus anticoagulant positive   . MI (myocardial infarction) 2001  . Type 2 diabetes mellitus ([place])     Past Surgical History:  Procedure Laterality Date  . Axillary abscess     left incision and drainage left  . BACK SURGERY     multiple  . BIOPSY  08/15/2014    Procedure: BIOPSY;  Surgeon: Danie Binder, MD;  Location: [facility];  Service: Endoscopy;;  Gastric  . BIOPSY  01/19/2015   Procedure: BIOPSY (GASTRIC ULCER);  Surgeon: Danie Binder, MD;  Location: [facility];  Service: Endoscopy;;  . cardiac stents     x5 stents  . COLONOSCOPY  June 2008   Dr. Dellis Filbert Medoff: Mild left colonic diverticulosis  . Dental extractions    . ESOPHAGOGASTRODUODENOSCOPY  June 2008   Dr. Dellis Filbert Medoff: Gastric ulcer, antral, biopsy with reactive changes associated glandular atrophy, no H pylori. No features of lymphoma. Likely NSAID related  . ESOPHAGOGASTRODUODENOSCOPY  August 2008   Dr. Dellis Filbert Medoff: Complete healing of gastric ulcers.  . ESOPHAGOGASTRODUODENOSCOPY (EGD) WITH PROPOFOL N/A 08/15/2014   SLF: 1. Abnormal pain due to ulcers & gastriitis   . ESOPHAGOGASTRODUODENOSCOPY (EGD) WITH PROPOFOL N/A 01/19/2015   SLF: 1. Persistant ulcer with firm base 2. single gastric polyp found in the gastric antrum. Ulcer base   . EUS N/A 03/21/2015   Procedure: ESOPHAGEAL ENDOSCOPIC ULTRASOUND (EUS) RADIAL;  Surgeon: Arta Silence, MD;  Location: [facility];  Service: Endoscopy;  Laterality: N/A;  . Inguinal lymph node biopsy     right inguinal lymph node biopsy  . NECK SURGERY    . POLYPECTOMY  01/19/2015   Procedure: POLYPECTOMY (GASTRIC);  Surgeon: Danie Binder, MD;  Location: [facility];  Service: Endoscopy;;  . SHOULDER ARTHROSCOPY Left     Family History  Problem Relation Age of Onset  . Pancreatic cancer Maternal Grandmother   . Colon cancer Neg Hx     Social History   Social History  . Marital status: Married    Spouse name: N/A  . Number of children: N/A  . Years of education: N/A   Occupational History  . Not on file.   Social History Main Topics  . Smoking status: Former Smoker    Packs/day: 1.00    Years: 23.00    Types: Cigarettes    Start date: 10/30/1970    Quit date: 10/29/1993  . Smokeless tobacco: Former Systems developer    Types: Snuff, Chew  .  Alcohol use No  . Drug use: No  . Sexual activity: No   Other Topics Concern  . Not on file   Social History Narrative  . No narrative on file      ROS:  General: Negative for anorexia, weight loss, fever, chills, fatigue, weakness. Eyes: Negative for vision changes.  ENT: Negative for hoarseness, difficulty swallowing , nasal congestion. CV: Negative for chest pain, angina, palpitations, dyspnea on exertion, peripheral edema.  Respiratory: Negative for dyspnea at rest, dyspnea on exertion, cough, sputum, wheezing.  GI: See history of present illness. GU:  Negative for  dysuria, hematuria, urinary incontinence, urinary frequency, nocturnal urination.  MS: chronic joint pain Derm: Negative for rash or itching.  Neuro: Negative for weakness, abnormal sensation, seizure, frequent headaches, memory loss, confusion.  Psych: Negative for anxiety, depression, suicidal ideation, hallucinations.  Endo: Negative for unusual weight change.  Heme: Negative for bruising or bleeding. Allergy: Negative for rash or hives.    Physical Examination:  BP (!) 146/73   Pulse 69   Temp 97.8 F (36.6 C) (Oral)   Ht 6\' 3"  (1.905 m)   Wt (!) 331 lb 9.6 oz (150.4 kg)   BMI 41.45 kg/m    General: Well-nourished, well-developed in no acute distress.  Head: Normocephalic, atraumatic.   Eyes: Conjunctiva pink, no icterus. Mouth: Oropharyngeal mucosa moist and pink , no lesions erythema or exudate. Neck: Supple without thyromegaly, masses, or lymphadenopathy.  Lungs: Clear to auscultation bilaterally.  Heart: Regular rate and rhythm, no murmurs rubs or gallops.  Abdomen: Bowel sounds are normal, nontender, nondistended but significantly obese, no hepatosplenomegaly or masses, no abdominal bruits or    hernia , no rebound or guarding.   Rectal: not performed Extremities: No lower extremity edema. No clubbing or deformities.  Neuro: Alert and oriented x 4 , grossly normal neurologically.  Skin: Warm  and dry, no rash or jaundice.   Psych: Alert and cooperative, normal mood and affect.  Labs: Lab Results  Component Value Date   CREATININE 1.33 01/28/2016   BUN 22 01/28/2016   NA 138 01/28/2016   K 4.6 01/28/2016   CL 99 01/28/2016   CO2 29 01/28/2016   Lab Results  Component Value Date   ALT 27 01/28/2016   AST 27 01/28/2016   ALKPHOS 50 01/28/2016   BILITOT 0.5 01/28/2016   Lab Results  Component Value Date   WBC 8.9 01/16/2015   HGB 13.3 01/16/2015   HCT 38.3 (L) 01/16/2015   MCV 96.5 01/16/2015   PLT 180 01/16/2015   Lab Results  Component Value Date   HGBA1C 7.0 (H) 01/28/2016   03/24/16:  INR 1.1  Cre 1.06, Tbili 0.6, AP 57, AST 26, ALT 31, albumin 4.1, white blood cell count 7200, hemoglobin 14.7, hematocrit 42.7, platelets 149,000, Na 136   Imaging Studies: No results found.

## 2016-04-04 NOTE — Patient Instructions (Signed)
1. Abdominal ultrasound as scheduled. We will contact you with results when available. 2. Return to the office to see Dr. Oneida Alar in 6 months.

## 2016-04-07 ENCOUNTER — Ambulatory Visit (HOSPITAL_COMMUNITY)
Admission: RE | Admit: 2016-04-07 | Discharge: 2016-04-07 | Disposition: A | Discharge status: Home / Self Care | Payer: BC Managed Care – PPO | Source: Ambulatory Visit | Attending: Gastroenterology | Admitting: Gastroenterology

## 2016-04-07 DIAGNOSIS — K7460 Unspecified cirrhosis of liver: Secondary | ICD-10-CM

## 2016-04-09 NOTE — Assessment & Plan Note (Signed)
60 year old gentleman with well compensated cirrhosis-Child Pugh A/MELD 8. Due for hepatoma surveillance. Next EGD planned for 2018.  Needs to continue weight loss efforts. Previously referred to Dr. Anastasio [name] for weight reduction. No significant weight reduction to date. Follow lowfat/diabetic diet, meats baked, broiled, or boiled. No fried foods. Will see if he would like to follow up with Dr. Anastasio [name].  Await u/s findings. Next labs due in 09/2016, CMET, PT/INR, CBC. Return to the off in in six months.

## 2016-04-14 NOTE — Progress Notes (Signed)
cc'd to pcp 

## 2016-04-21 NOTE — Progress Notes (Signed)
Please let patient know his u/s showed no gross liver mass but limited visualization due to steatosis.   Dr. Oneida Alar, do you recommend short interval follow up imaging with MRI or MRI now given limited exam via u/s?

## 2016-04-22 NOTE — Progress Notes (Signed)
LMOM to call.

## 2016-04-23 ENCOUNTER — Telehealth: Payer: Self-pay

## 2016-04-23 NOTE — Progress Notes (Signed)
LMOM to call. Mailing a letter to call also.  

## 2016-04-23 NOTE — Telephone Encounter (Signed)
PT called and was informed of his [place] results.

## 2016-04-29 ENCOUNTER — Encounter: Payer: Self-pay | Admitting: Gastroenterology

## 2016-05-08 ENCOUNTER — Other Ambulatory Visit: Payer: Self-pay | Admitting: "Endocrinology

## 2016-05-08 LAB — COMPREHENSIVE METABOLIC PANEL
ALBUMIN: 3.7 g/dL (ref 3.6–5.1)
ALT: 24 U/L (ref 9–46)
AST: 25 U/L (ref 10–35)
Alkaline Phosphatase: 42 U/L (ref 40–115)
BUN: 16 mg/dL (ref 7–25)
CHLORIDE: 106 mmol/L (ref 98–110)
CO2: 27 mmol/L (ref 20–31)
Calcium: 8.9 mg/dL (ref 8.6–10.3)
Creat: 1.23 mg/dL (ref 0.70–1.33)
Glucose, Bld: 157 mg/dL — ABNORMAL HIGH (ref 65–99)
POTASSIUM: 4.6 mmol/L (ref 3.5–5.3)
Sodium: 138 mmol/L (ref 135–146)
TOTAL PROTEIN: 6.5 g/dL (ref 6.1–8.1)
Total Bilirubin: 0.6 mg/dL (ref 0.2–1.2)

## 2016-05-08 LAB — HEMOGLOBIN A1C
HEMOGLOBIN A1C: 7.8 % — AB (ref <5.7)
MEAN PLASMA GLUCOSE: 177 mg/dL

## 2016-05-13 ENCOUNTER — Other Ambulatory Visit: Payer: Self-pay | Admitting: "Endocrinology

## 2016-05-13 ENCOUNTER — Other Ambulatory Visit: Payer: Self-pay | Admitting: Cardiology

## 2016-05-16 ENCOUNTER — Ambulatory Visit (INDEPENDENT_AMBULATORY_CARE_PROVIDER_SITE_OTHER): Payer: BC Managed Care – PPO | Admitting: "Endocrinology

## 2016-05-16 ENCOUNTER — Encounter: Payer: Self-pay | Admitting: "Endocrinology

## 2016-05-16 VITALS — BP 110/71 | HR 77 | Ht 75.0 in | Wt 328.0 lb

## 2016-05-16 DIAGNOSIS — E782 Mixed hyperlipidemia: Secondary | ICD-10-CM

## 2016-05-16 DIAGNOSIS — I10 Essential (primary) hypertension: Secondary | ICD-10-CM | POA: Diagnosis not present

## 2016-05-16 DIAGNOSIS — E1159 Type 2 diabetes mellitus with other circulatory complications: Secondary | ICD-10-CM

## 2016-05-16 MED ORDER — INSULIN ASPART 100 UNIT/ML FLEXPEN: 18.0000 [IU] | PEN_INJECTOR | Freq: Three times a day (TID) | SUBCUTANEOUS | 2 refills | Status: DC

## 2016-05-16 MED ORDER — BASAGLAR KWIKPEN 100 UNIT/ML ~~LOC~~ SOPN: 90.0000 [IU] | PEN_INJECTOR | Freq: Every day | SUBCUTANEOUS | 2 refills | Status: DC

## 2016-05-16 NOTE — Progress Notes (Signed)
Subjective:    Patient [name], male    DOB: [birth year], PCP Alonza Bogus, MD   Past Medical History:  Diagnosis Date  . Anxiety   . Arthritis   . Coronary atherosclerosis of native coronary artery    BMS LAD and PTCA RCA 2000,BMS RCA/PLA 2001  . Diabetes mellitus   . Gastric ulcer   . GERD (gastroesophageal reflux disease)   . HTN (hypertension)   . Hyperlipidemia   . Low-grade NHL (non-Hodgkin's lymphoma)   . Lupus anticoagulant positive   . MI (myocardial infarction) 2001  . Type 2 diabetes mellitus ([place])    Past Surgical History:  Procedure Laterality Date  . Axillary abscess     left incision and drainage left  . BACK SURGERY     multiple  . BIOPSY  08/15/2014   Procedure: BIOPSY;  Surgeon: Danie Binder, MD;  Location: [facility];  Service: Endoscopy;;  Gastric  . BIOPSY  01/19/2015   Procedure: BIOPSY (GASTRIC ULCER);  Surgeon: Danie Binder, MD;  Location: [facility];  Service: Endoscopy;;  . cardiac stents     x5 stents  . COLONOSCOPY  June 2008   Dr. Dellis Filbert Medoff: Mild left colonic diverticulosis  . Dental extractions    . ESOPHAGOGASTRODUODENOSCOPY  June 2008   Dr. Dellis Filbert Medoff: Gastric ulcer, antral, biopsy with reactive changes associated glandular atrophy, no H pylori. No features of lymphoma. Likely NSAID related  . ESOPHAGOGASTRODUODENOSCOPY  August 2008   Dr. Dellis Filbert Medoff: Complete healing of gastric ulcers.  . ESOPHAGOGASTRODUODENOSCOPY (EGD) WITH PROPOFOL N/A 08/15/2014   SLF: 1. Abnormal pain due to ulcers & gastriitis   . ESOPHAGOGASTRODUODENOSCOPY (EGD) WITH PROPOFOL N/A 01/19/2015   SLF: 1. Persistant ulcer with firm base 2. single gastric polyp found in the gastric antrum. Ulcer base   . EUS N/A 03/21/2015   Procedure: ESOPHAGEAL ENDOSCOPIC ULTRASOUND (EUS) RADIAL;  Surgeon: Arta Silence, MD;  Location: [facility];  Service: Endoscopy;  Laterality: N/A;  . Inguinal lymph node biopsy     right inguinal lymph node biopsy  . NECK  SURGERY    . POLYPECTOMY  01/19/2015   Procedure: POLYPECTOMY (GASTRIC);  Surgeon: Danie Binder, MD;  Location: [facility];  Service: Endoscopy;;  . SHOULDER ARTHROSCOPY Left    Social History   Social History  . Marital status: Married    Spouse name: N/A  . Number of children: N/A  . Years of education: N/A   Social History Main Topics  . Smoking status: Former Smoker    Packs/day: 1.00    Years: 23.00    Types: Cigarettes    Start date: 10/30/1970    Quit date: 10/29/1993  . Smokeless tobacco: Former Systems developer    Types: Snuff, Chew  . Alcohol use No  . Drug use: No  . Sexual activity: No   Other Topics Concern  . Not on file   Social History Narrative  . No narrative on file   Outpatient Encounter Prescriptions as of 05/16/2016  Medication Sig  . aspirin EC 81 MG tablet Take 81 mg by mouth daily.  . cyclobenzaprine (FLEXERIL) 10 MG tablet Take 10 mg by mouth 3 (three) times daily as needed for muscle spasms.  Marland Kitchen FLUoxetine (PROZAC) 20 MG capsule Take 20 mg by mouth daily.  Marland Kitchen gabapentin (NEURONTIN) 300 MG capsule Take 300 mg by mouth at bedtime.  Marland Kitchen glucose blood (ONETOUCH VERIO) test strip Use as instructed 4 x daily. E11.65  .  insulin aspart (NOVOLOG FLEXPEN) 100 UNIT/ML FlexPen Inject 18-24 Units into the skin 3 (three) times daily with meals.  . Insulin Glargine (BASAGLAR KWIKPEN) 100 UNIT/ML SOPN Inject 0.9 mLs (90 Units total) into the skin daily at 10 pm.  . isosorbide mononitrate (IMDUR) 30 MG 24 hr tablet TAKE 1 TABLET BY MOUTH EVERY DAY  . losartan (COZAAR) 100 MG tablet Take 100 mg by mouth daily.  . metFORMIN (GLUCOPHAGE) 500 MG tablet Take 1,000 mg by mouth 2 (two) times daily with a meal.    . metoprolol (LOPRESSOR) 50 MG tablet Take 25 mg by mouth 2 (two) times daily.    . nitroGLYCERIN (NITROSTAT) 0.4 MG SL tablet Place 1 tablet (0.4 mg total) under the tongue every 5 (five) minutes as needed.  Marland Kitchen oxyCODONE-acetaminophen (PERCOCET) 10-325 MG per tablet Take 1 tablet  by mouth every 6 (six) hours as needed for pain.   . pantoprazole (PROTONIX) 40 MG tablet Take 40 mg by mouth daily before breakfast.   . pantoprazole (PROTONIX) 40 MG tablet TAKE 1 TABLET BY MOUTH BEFORE BREAKFAST  . simvastatin (ZOCOR) 10 MG tablet TAKE 1 TABLET BY MOUTH EVERY NIGHT AT BEDTIME  . spironolactone (ALDACTONE) 50 MG tablet Take 1 tablet (50 mg total) by mouth daily.  Marland Kitchen VICTOZA 18 MG/3ML SOPN ADMINISTER 1.8 MG UNDER THE SKIN EVERY DAY  . zolpidem (AMBIEN) 10 MG tablet Take 10 mg by mouth at bedtime as needed for sleep.  . [DISCONTINUED] insulin aspart (NOVOLOG FLEXPEN) 100 UNIT/ML FlexPen Inject 15-21 Units into the skin 3 (three) times daily with meals.  . [DISCONTINUED] Insulin Glargine (BASAGLAR KWIKPEN) 100 UNIT/ML SOPN INJECT 80 UNITS INTO THE SKIN DAILY AT 10 PM   No facility-administered encounter medications on file as of 05/16/2016.    ALLERGIES: No Known Allergies VACCINATION STATUS:  There is no immunization history on file for this patient.  Diabetes  He presents for his follow-up diabetic visit. He has type 2 diabetes mellitus. Onset time: He was diagnosed at approximate age of 36 years. His disease course has been improving. There are no hypoglycemic associated symptoms. Pertinent negatives for hypoglycemia include no confusion, headaches, pallor or seizures. There are no diabetic associated symptoms. Pertinent negatives for diabetes include no chest pain, no fatigue, no polydipsia, no polyphagia, no polyuria and no weakness. Symptoms are improving. Diabetic complications include heart disease and nephropathy. Risk factors for coronary artery disease include diabetes mellitus, dyslipidemia, hypertension, male sex, obesity, sedentary lifestyle and tobacco exposure. Current diabetic treatment includes intensive insulin program and oral agent (dual therapy). He is compliant with treatment most of the time. His weight is increasing steadily. He is following a generally  unhealthy diet. When asked about meal planning, he reported none. Prior visit with dietitian: Declines dietitian referral. His breakfast blood glucose range is generally 140-180 mg/dl. His lunch blood glucose range is generally 140-180 mg/dl. His dinner blood glucose range is generally 140-180 mg/dl. His overall blood glucose range is 140-180 mg/dl. An ACE inhibitor/angiotensin II receptor blocker is being taken.  Hyperlipidemia  This is a chronic problem. The current episode started more than 1 year ago. Exacerbating diseases include diabetes and obesity. Pertinent negatives include no chest pain, myalgias or shortness of breath. Current antihyperlipidemic treatment includes statins. Risk factors for coronary artery disease include diabetes mellitus, dyslipidemia, hypertension, male sex and a sedentary lifestyle.  Hypertension  This is a chronic problem. The current episode started more than 1 year ago. The problem is controlled. Pertinent negatives include  no chest pain, headaches, neck pain, palpitations or shortness of breath. Past treatments include angiotensin blockers. The current treatment provides moderate improvement. Hypertensive end-organ damage includes CAD/MI.     Review of Systems  Constitutional: Negative for fatigue and unexpected weight change.  HENT: Negative for dental problem, mouth sores and trouble swallowing.   Eyes: Negative for visual disturbance.  Respiratory: Negative for cough, choking, chest tightness, shortness of breath and wheezing.   Cardiovascular: Negative for chest pain, palpitations and leg swelling.  Gastrointestinal: Negative for abdominal distention, abdominal pain, constipation, diarrhea, nausea and vomiting.  Endocrine: Negative for polydipsia, polyphagia and polyuria.  Genitourinary: Negative for dysuria, flank pain, hematuria and urgency.  Musculoskeletal: Negative for back pain, gait problem, myalgias and neck pain.  Skin: Negative for pallor, rash and  wound.  Neurological: Negative for seizures, syncope, weakness, numbness and headaches.  Psychiatric/Behavioral: Negative.  Negative for confusion and dysphoric mood.    Objective:    BP 110/71   Pulse 77   Wt (!) 328 lb (148.8 kg)   BMI 41.00 kg/m   Wt Readings from Last 3 Encounters:  05/16/16 (!) 328 lb (148.8 kg)  04/04/16 (!) 331 lb 9.6 oz (150.4 kg)  02/05/16 (!) 338 lb (153.3 kg)    Physical Exam  Constitutional: He is oriented to person, place, and time. He appears well-developed and well-nourished. He is cooperative. No distress.  HENT:  Head: Normocephalic and atraumatic.  Eyes: EOM are normal.  Neck: Normal range of motion. Neck supple. No tracheal deviation present. No thyromegaly present.  Cardiovascular: Normal rate, S1 normal, S2 normal and normal heart sounds.  Exam reveals no gallop.   No murmur heard. Pulses:      Dorsalis pedis pulses are 1+ on the right side, and 1+ on the left side.       Posterior tibial pulses are 1+ on the right side, and 1+ on the left side.  Pulmonary/Chest: Breath sounds normal. No respiratory distress. He has no wheezes.  Abdominal: Soft. Bowel sounds are normal. He exhibits no distension. There is no tenderness. There is no guarding and no CVA tenderness.  Musculoskeletal: He exhibits no edema.       Right shoulder: He exhibits no swelling and no deformity.  Neurological: He is alert and oriented to person, place, and time. He has normal strength and normal reflexes. No cranial nerve deficit or sensory deficit. Gait normal.  Skin: Skin is warm and dry. No rash noted. No cyanosis. Nails show no clubbing.  Psychiatric: He has a normal mood and affect. His speech is normal and behavior is normal. Judgment and thought content normal. Cognition and memory are normal.    Results for orders placed or performed in visit on 05/08/16  Comprehensive metabolic panel  Result Value Ref Range   Sodium 138 135 - 146 mmol/L   Potassium 4.6 3.5 -  5.3 mmol/L   Chloride 106 98 - 110 mmol/L   CO2 27 20 - 31 mmol/L   Glucose, Bld 157 (H) 65 - 99 mg/dL   BUN 16 7 - 25 mg/dL   Creat 1.23 0.70 - 1.33 mg/dL   Total Bilirubin 0.6 0.2 - 1.2 mg/dL   Alkaline Phosphatase 42 40 - 115 U/L   AST 25 10 - 35 U/L   ALT 24 9 - 46 U/L   Total Protein 6.5 6.1 - 8.1 g/dL   Albumin 3.7 3.6 - 5.1 g/dL   Calcium 8.9 8.6 - 10.3 mg/dL  Hemoglobin A1c  Result Value Ref Range   Hgb A1c MFr Bld 7.8 (H) <5.7 %   Mean Plasma Glucose 177 mg/dL   Diabetic Labs (most recent): Lab Results  Component Value Date   HGBA1C 7.8 (H) 05/08/2016   HGBA1C 7.0 (H) 01/28/2016   HGBA1C 7.9 (H) 10/29/2015   Lipid Panel     Component Value Date/Time   CHOL 137 01/28/2016 1602   TRIG 281 (H) 01/28/2016 1602   HDL 40 01/28/2016 1602   CHOLHDL 3.4 01/28/2016 1602   VLDL 56 (H) 01/28/2016 1602   LDLCALC 41 01/28/2016 1602       Assessment & Plan:   1. Type 2 diabetes mellitus with vascular disease (HCC)  -His diabetes is  complicated by coronary artery disease and patient remains at a high risk for more acute and chronic complications of diabetes which include CAD, CVA, CKD, retinopathy, and neuropathy. These are all discussed in detail with the patient.  Patient came with uncontrolled glucose profile, and  recent A1c of  7.8%, increasing from 7 %.  Glucose logs and insulin administration records pertaining to this visit,  to be scanned into patient's records.  Recent labs reviewed.   - I have re-counseled the patient on diet management and weight loss  by adopting a carbohydrate restricted / protein rich  Diet.  - Suggestion is made for patient to avoid simple carbohydrates   from their diet including Cakes , Desserts, Ice Cream,  Soda (  diet and regular) , Sweet Tea , Candies,  Chips, Cookies, Artificial Sweeteners,   and "Sugar-free" Products .  This will help patient to have stable blood glucose profile and potentially avoid unintended  Weight gain.  -  Patient is advised to stick to a routine mealtimes to eat 3 meals  a day and avoid unnecessary snacks ( to snack only to correct hypoglycemia).  - The patient  Declines it with CDE.  - I have approached patient with the following individualized plan to manage diabetes and patient agrees.  -Increase basal insulin Basaglar  to 90 units qhs, and increase Novolog  to 18 units TIDAC for pre-meal BG readings of 90-150mg /dl, plus patient specific correction dose of rapid acting insulin for unexpected hyperglycemia above 150mg /dl, associated with strict monitoring of BG AC and HS.  -Adjustment parameters for hypo and hyperglycemia were given in a written document to patient. -Patient is encouraged to call clinic for blood glucose levels less than 70 or above 300 mg /dl.  I will continue MTF 1000mg  po BID, and continue  Victoza  1.8mg  sq daily to advance as tolerated. -he did not succeed getting on DEXCOM.  - Patient specific target  for A1c; LDL, HDL, Triglycerides, and  Waist Circumference were discussed in detail.  2) BP/HTN: Controlled . Continue current medications including ACEI/ARB. 3) Lipids/HPL:  continue statins. 4)  Weight/Diet: CDE consult in progress, exercise, and carbohydrates information provided.  5) Chronic Care/Health Maintenance:  -Patient is on ACEI/ARB and Statin medications and encouraged to continue to follow up with Ophthalmology, Podiatrist at least yearly or according to recommendations, and advised to  stay away from smoking. I have recommended yearly flu vaccine and pneumonia vaccination at least every 5 years; moderate intensity exercise for up to 150 minutes weekly; and  sleep for at least 7 hours a day.  - 25 minutes of time was spent on the care of this patient , 50% of which was applied for counseling on diabetes complications and their preventions.  -  I advised patient to maintain close follow up with HAWKINS,EDWARD L, MD for primary care needs.  Patient is asked  to bring meter and  blood glucose logs during their next visit.    Follow up plan: -Return in about 3 months (around 08/14/2016) for follow up with pre-visit labs, meter, and logs.  Glade Lloyd, MD Phone: [phone]  Fax: [phone]   05/16/2016, 8:47 AM

## 2016-05-16 NOTE — Patient Instructions (Signed)

## 2016-05-19 ENCOUNTER — Other Ambulatory Visit: Payer: Self-pay | Admitting: "Endocrinology

## 2016-05-30 NOTE — Progress Notes (Signed)
Personally reviewed abdominal ultrasound films with Dr. Kris Hartmann, radiologist. Patient is really not a good ultrasound candidate based on the amount of steatosis and body habitus. Images limited but no obvious gross mass.  If patient a candidate for MRI, radiologist recommends MRI abdomen with and without contrast to screen for hepatoma. Schedule if patient in agreement.  Please check if patient needs to go to [facility] due to his size. There MRI is larger.

## 2016-06-02 NOTE — Progress Notes (Signed)
LMOM to call.

## 2016-06-03 NOTE — Progress Notes (Signed)
LMOM to call and mailing a letter to call also.

## 2016-06-12 ENCOUNTER — Telehealth: Payer: Self-pay | Admitting: "Endocrinology

## 2016-06-12 NOTE — Telephone Encounter (Signed)
Patient LMOM that he needs to know what to do for Pain and itching in his fiingers. He feels like it is related to his diabetes.

## 2016-06-12 NOTE — Telephone Encounter (Signed)
If he is taking his gabapentin only once a day, he can increase it to twice a day; if he is taking it twice a day, he can increase to 3 times a day.

## 2016-06-12 NOTE — Telephone Encounter (Signed)
Called pt. No answer. Left message for him to cal back

## 2016-06-13 NOTE — Telephone Encounter (Signed)
Pt notified and agrees. 

## 2016-06-17 ENCOUNTER — Other Ambulatory Visit (HOSPITAL_COMMUNITY): Payer: Self-pay | Admitting: *Deleted

## 2016-06-17 DIAGNOSIS — R609 Edema, unspecified: Secondary | ICD-10-CM

## 2016-06-17 MED ORDER — SPIRONOLACTONE 50 MG PO TABS: 50.0000 mg | ORAL_TABLET | Freq: Every day | ORAL | 2 refills | Status: DC

## 2016-06-17 NOTE — Telephone Encounter (Signed)
Left patient a message stating that refills have been sent to pharmacy.  Advised patient to return call if he needs anything further.

## 2016-07-16 ENCOUNTER — Other Ambulatory Visit: Payer: Self-pay | Admitting: Gastroenterology

## 2016-07-18 ENCOUNTER — Encounter: Payer: Self-pay | Admitting: "Endocrinology

## 2016-08-11 ENCOUNTER — Other Ambulatory Visit: Payer: Self-pay | Admitting: "Endocrinology

## 2016-08-12 LAB — COMPREHENSIVE METABOLIC PANEL
ALT: 21 U/L (ref 9–46)
AST: 22 U/L (ref 10–35)
Albumin: 3.6 g/dL (ref 3.6–5.1)
Alkaline Phosphatase: 54 U/L (ref 40–115)
BUN: 30 mg/dL — ABNORMAL HIGH (ref 7–25)
CALCIUM: 8.7 mg/dL (ref 8.6–10.3)
CHLORIDE: 101 mmol/L (ref 98–110)
CO2: 24 mmol/L (ref 20–31)
Creat: 1.37 mg/dL — ABNORMAL HIGH (ref 0.70–1.33)
GLUCOSE: 251 mg/dL — AB (ref 65–99)
POTASSIUM: 4.6 mmol/L (ref 3.5–5.3)
Sodium: 135 mmol/L (ref 135–146)
Total Bilirubin: 0.4 mg/dL (ref 0.2–1.2)
Total Protein: 6.6 g/dL (ref 6.1–8.1)

## 2016-08-12 LAB — HEMOGLOBIN A1C
Hgb A1c MFr Bld: 7.1 % — ABNORMAL HIGH (ref <5.7)
Mean Plasma Glucose: 157 mg/dL

## 2016-08-13 ENCOUNTER — Other Ambulatory Visit: Payer: Self-pay | Admitting: "Endocrinology

## 2016-08-15 ENCOUNTER — Ambulatory Visit: Payer: BC Managed Care – PPO | Admitting: "Endocrinology

## 2016-08-19 ENCOUNTER — Encounter: Payer: Self-pay | Admitting: Gastroenterology

## 2016-08-19 ENCOUNTER — Other Ambulatory Visit: Payer: Self-pay | Admitting: "Endocrinology

## 2016-08-22 ENCOUNTER — Ambulatory Visit (INDEPENDENT_AMBULATORY_CARE_PROVIDER_SITE_OTHER): Payer: BC Managed Care – PPO | Admitting: "Endocrinology

## 2016-08-22 ENCOUNTER — Encounter: Payer: Self-pay | Admitting: "Endocrinology

## 2016-08-22 VITALS — BP 139/82 | HR 80 | Ht 75.0 in | Wt 352.0 lb

## 2016-08-22 DIAGNOSIS — I10 Essential (primary) hypertension: Secondary | ICD-10-CM | POA: Diagnosis not present

## 2016-08-22 DIAGNOSIS — E1159 Type 2 diabetes mellitus with other circulatory complications: Secondary | ICD-10-CM

## 2016-08-22 DIAGNOSIS — E782 Mixed hyperlipidemia: Secondary | ICD-10-CM | POA: Diagnosis not present

## 2016-08-22 MED ORDER — METFORMIN HCL 500 MG PO TABS: 500.0000 mg | ORAL_TABLET | Freq: Two times a day (BID) | ORAL | 3 refills | Status: DC

## 2016-08-22 MED ORDER — GLUCOSE BLOOD VI STRP: ORAL_STRIP | 0 refills | Status: DC

## 2016-08-22 NOTE — Progress Notes (Signed)
Subjective:    Patient [name], male    DOB: [birth year], PCP Alonza Bogus, MD   Past Medical History:  Diagnosis Date  . Anxiety   . Arthritis   . Coronary atherosclerosis of native coronary artery    BMS LAD and PTCA RCA 2000,BMS RCA/PLA 2001  . Diabetes mellitus   . Gastric ulcer   . GERD (gastroesophageal reflux disease)   . HTN (hypertension)   . Hyperlipidemia   . Low-grade NHL (non-Hodgkin's lymphoma)   . Lupus anticoagulant positive   . MI (myocardial infarction) 2001  . Type 2 diabetes mellitus ([place])    Past Surgical History:  Procedure Laterality Date  . Axillary abscess     left incision and drainage left  . BACK SURGERY     multiple  . BIOPSY  08/15/2014   Procedure: BIOPSY;  Surgeon: Danie Binder, MD;  Location: [facility];  Service: Endoscopy;;  Gastric  . BIOPSY  01/19/2015   Procedure: BIOPSY (GASTRIC ULCER);  Surgeon: Danie Binder, MD;  Location: [facility];  Service: Endoscopy;;  . cardiac stents     x5 stents  . COLONOSCOPY  June 2008   Dr. Dellis Filbert Medoff: Mild left colonic diverticulosis  . Dental extractions    . ESOPHAGOGASTRODUODENOSCOPY  June 2008   Dr. Dellis Filbert Medoff: Gastric ulcer, antral, biopsy with reactive changes associated glandular atrophy, no H pylori. No features of lymphoma. Likely NSAID related  . ESOPHAGOGASTRODUODENOSCOPY  August 2008   Dr. Dellis Filbert Medoff: Complete healing of gastric ulcers.  . ESOPHAGOGASTRODUODENOSCOPY (EGD) WITH PROPOFOL N/A 08/15/2014   SLF: 1. Abnormal pain due to ulcers & gastriitis   . ESOPHAGOGASTRODUODENOSCOPY (EGD) WITH PROPOFOL N/A 01/19/2015   SLF: 1. Persistant ulcer with firm base 2. single gastric polyp found in the gastric antrum. Ulcer base   . EUS N/A 03/21/2015   Procedure: ESOPHAGEAL ENDOSCOPIC ULTRASOUND (EUS) RADIAL;  Surgeon: Arta Silence, MD;  [facility];  Service: Endoscopy;  Laterality: N/A;  . Inguinal lymph node biopsy     right inguinal lymph node biopsy  . NECK  SURGERY    . POLYPECTOMY  01/19/2015   Procedure: POLYPECTOMY (GASTRIC);  Surgeon: Danie Binder, MD;  Location: [facility];  Service: Endoscopy;;  . SHOULDER ARTHROSCOPY Left    Social History   Social History  . Marital status: Married    Spouse name: N/A  . Number of children: N/A  . Years of education: N/A   Social History Main Topics  . Smoking status: Former Smoker    Packs/day: 1.00    Years: 23.00    Types: Cigarettes    Start date: 10/30/1970    Quit date: 10/29/1993  . Smokeless tobacco: Former Systems developer    Types: Snuff, Chew  . Alcohol use No  . Drug use: No  . Sexual activity: No   Other Topics Concern  . None   Social History Narrative  . None   Outpatient Encounter Prescriptions as of 08/22/2016  Medication Sig  . aspirin EC 81 MG tablet Take 81 mg by mouth daily.  . cyclobenzaprine (FLEXERIL) 10 MG tablet Take 10 mg by mouth 3 (three) times daily as needed for muscle spasms.  Marland Kitchen FLUoxetine (PROZAC) 20 MG capsule Take 20 mg by mouth daily.  Marland Kitchen gabapentin (NEURONTIN) 300 MG capsule Take 300 mg by mouth at bedtime.  Marland Kitchen glucose blood (ONETOUCH VERIO) test strip USE AS DIRECTED FOUR TIMES DAILY  . insulin aspart (NOVOLOG FLEXPEN) 100  UNIT/ML FlexPen Inject 18-24 Units into the skin 3 (three) times daily with meals.  . Insulin Glargine (BASAGLAR KWIKPEN) 100 UNIT/ML SOPN INJECT 80 UNITS INTO THE SKIN DAILY AT 10 PM  . isosorbide mononitrate (IMDUR) 30 MG 24 hr tablet TAKE 1 TABLET BY MOUTH EVERY DAY  . losartan (COZAAR) 100 MG tablet Take 100 mg by mouth daily.  . metFORMIN (GLUCOPHAGE) 500 MG tablet Take 1 tablet (500 mg total) by mouth 2 (two) times daily with a meal.  . metoprolol (LOPRESSOR) 50 MG tablet Take 25 mg by mouth 2 (two) times daily.    . nitroGLYCERIN (NITROSTAT) 0.4 MG SL tablet Place 1 tablet (0.4 mg total) under the tongue every 5 (five) minutes as needed.  Marland Kitchen oxyCODONE-acetaminophen (PERCOCET) 10-325 MG per tablet Take 1 tablet by mouth every 6 (six) hours as  needed for pain.   . pantoprazole (PROTONIX) 40 MG tablet TAKE 1 TABLET BY MOUTH BEFORE BREAKFAST  . simvastatin (ZOCOR) 10 MG tablet TAKE 1 TABLET BY MOUTH EVERY NIGHT AT BEDTIME  . spironolactone (ALDACTONE) 50 MG tablet Take 1 tablet (50 mg total) by mouth daily.  Marland Kitchen VICTOZA 18 MG/3ML SOPN ADMINISTER 1.8 MG UNDER THE SKIN EVERY DAY  . zolpidem (AMBIEN) 10 MG tablet Take 10 mg by mouth at bedtime as needed for sleep.  . [DISCONTINUED] Insulin Glargine (BASAGLAR KWIKPEN) 100 UNIT/ML SOPN Inject 0.9 mLs (90 Units total) into the skin daily at 10 pm.  . [DISCONTINUED] metFORMIN (GLUCOPHAGE) 500 MG tablet Take 1,000 mg by mouth 2 (two) times daily with a meal.    . [DISCONTINUED] ONETOUCH VERIO test strip USE AS DIRECTED FOUR TIMES DAILY  . [DISCONTINUED] pantoprazole (PROTONIX) 40 MG tablet Take 40 mg by mouth daily before breakfast.    No facility-administered encounter medications on file as of 08/22/2016.    ALLERGIES: No Known Allergies VACCINATION STATUS:  There is no immunization history on file for this patient.  Diabetes  He presents for his follow-up diabetic visit. He has type 2 diabetes mellitus. Onset time: He was diagnosed at approximate age of 69 years. His disease course has been improving. There are no hypoglycemic associated symptoms. Pertinent negatives for hypoglycemia include no confusion, headaches, pallor or seizures. There are no diabetic associated symptoms. Pertinent negatives for diabetes include no chest pain, no fatigue, no polydipsia, no polyphagia, no polyuria and no weakness. Symptoms are improving. Diabetic complications include heart disease and nephropathy. Risk factors for coronary artery disease include diabetes mellitus, dyslipidemia, hypertension, male sex, obesity, sedentary lifestyle and tobacco exposure. Current diabetic treatment includes intensive insulin program and oral agent (dual therapy). He is compliant with treatment most of the time. His weight is  increasing steadily. He is following a generally unhealthy diet. When asked about meal planning, he reported none. Prior visit with dietitian: Declines dietitian referral. His breakfast blood glucose range is generally 140-180 mg/dl. His lunch blood glucose range is generally 140-180 mg/dl. His dinner blood glucose range is generally 140-180 mg/dl. His overall blood glucose range is 140-180 mg/dl. An ACE inhibitor/angiotensin II receptor blocker is being taken.  Hyperlipidemia  This is a chronic problem. The current episode started more than 1 year ago. Exacerbating diseases include diabetes and obesity. Pertinent negatives include no chest pain, myalgias or shortness of breath. Current antihyperlipidemic treatment includes statins. Risk factors for coronary artery disease include diabetes mellitus, dyslipidemia, hypertension, male sex and a sedentary lifestyle.  Hypertension  This is a chronic problem. The current episode started more than  1 year ago. The problem is controlled. Pertinent negatives include no chest pain, headaches, neck pain, palpitations or shortness of breath. Past treatments include angiotensin blockers. The current treatment provides moderate improvement. Hypertensive end-organ damage includes CAD/MI.    Review of Systems  Constitutional: Negative for fatigue and unexpected weight change.  HENT: Negative for dental problem, mouth sores and trouble swallowing.   Eyes: Negative for visual disturbance.  Respiratory: Negative for cough, choking, chest tightness, shortness of breath and wheezing.   Cardiovascular: Negative for chest pain, palpitations and leg swelling.  Gastrointestinal: Negative for abdominal distention, abdominal pain, constipation, diarrhea, nausea and vomiting.  Endocrine: Negative for polydipsia, polyphagia and polyuria.  Genitourinary: Negative for dysuria, flank pain, hematuria and urgency.  Musculoskeletal: Negative for back pain, gait problem, myalgias and  neck pain.  Skin: Negative for pallor, rash and wound.  Neurological: Negative for seizures, syncope, weakness, numbness and headaches.  Psychiatric/Behavioral: Negative.  Negative for confusion and dysphoric mood.    Objective:    BP 139/82   Pulse 80   Ht 6\' 3"  (1.905 m)   Wt (!) 352 lb (159.7 kg)   BMI 44.00 kg/m   Wt Readings from Last 3 Encounters:  08/22/16 (!) 352 lb (159.7 kg)  05/16/16 (!) 328 lb (148.8 kg)  04/04/16 (!) 331 lb 9.6 oz (150.4 kg)    Physical Exam  Constitutional: He is oriented to person, place, and time. He appears well-developed and well-nourished. He is cooperative. No distress.  HENT:  Head: Normocephalic and atraumatic.  Eyes: EOM are normal.  Neck: Normal range of motion. Neck supple. No tracheal deviation present. No thyromegaly present.  Cardiovascular: Normal rate, S1 normal, S2 normal and normal heart sounds.  Exam reveals no gallop.   No murmur heard. Pulses:      Dorsalis pedis pulses are 1+ on the right side, and 1+ on the left side.       Posterior tibial pulses are 1+ on the right side, and 1+ on the left side.  Pulmonary/Chest: Breath sounds normal. No respiratory distress. He has no wheezes.  Abdominal: Soft. Bowel sounds are normal. He exhibits no distension. There is no tenderness. There is no guarding and no CVA tenderness.  Musculoskeletal: He exhibits no edema.       Right shoulder: He exhibits no swelling and no deformity.  Neurological: He is alert and oriented to person, place, and time. He has normal strength and normal reflexes. No cranial nerve deficit or sensory deficit. Gait normal.  Skin: Skin is warm and dry. No rash noted. No cyanosis. Nails show no clubbing.  Psychiatric: He has a normal mood and affect. His speech is normal and behavior is normal. Judgment and thought content normal. Cognition and memory are normal.    Results for orders placed or performed in visit on 08/11/16  Comprehensive metabolic panel  Result  Value Ref Range   Sodium 135 135 - 146 mmol/L   Potassium 4.6 3.5 - 5.3 mmol/L   Chloride 101 98 - 110 mmol/L   CO2 24 20 - 31 mmol/L   Glucose, Bld 251 (H) 65 - 99 mg/dL   BUN 30 (H) 7 - 25 mg/dL   Creat 1.37 (H) 0.70 - 1.33 mg/dL   Total Bilirubin 0.4 0.2 - 1.2 mg/dL   Alkaline Phosphatase 54 40 - 115 U/L   AST 22 10 - 35 U/L   ALT 21 9 - 46 U/L   Total Protein 6.6 6.1 - 8.1 g/dL  Albumin 3.6 3.6 - 5.1 g/dL   Calcium 8.7 8.6 - 10.3 mg/dL  Hemoglobin A1c  Result Value Ref Range   Hgb A1c MFr Bld 7.1 (H) <5.7 %   Mean Plasma Glucose 157 mg/dL   Diabetic Labs (most recent): Lab Results  Component Value Date   HGBA1C 7.1 (H) 08/11/2016   HGBA1C 7.8 (H) 05/08/2016   HGBA1C 7.0 (H) 01/28/2016   Lipid Panel     Component Value Date/Time   CHOL 137 01/28/2016 1602   TRIG 281 (H) 01/28/2016 1602   HDL 40 01/28/2016 1602   CHOLHDL 3.4 01/28/2016 1602   VLDL 56 (H) 01/28/2016 1602   LDLCALC 41 01/28/2016 1602       Assessment & Plan:   1. 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 These are all discussed in detail with the patient.  Patient came with controlled glucose profile, and  recent A1c of  7.1% improving from  7.8%.  Glucose logs and insulin administration records pertaining to this visit,  to be scanned into patient's records.  Recent labs reviewed.   - I have re-counseled the patient on diet management and weight loss  by adopting a carbohydrate restricted / protein rich  Diet.  - Suggestion is made for patient to avoid simple carbohydrates   from their diet including Cakes , Desserts, Ice Cream,  Soda (  diet and regular) , Sweet Tea , Candies,  Chips, Cookies, Artificial Sweeteners,   and "Sugar-free" Products .  This will help patient to  have stable blood glucose profile and potentially avoid unintended  Weight gain.  - Patient is advised to stick to a routine mealtimes to eat 3 meals  a day and avoid unnecessary snacks ( to snack only to correct hypoglycemia).  - The patient  Declines it with CDE.  - I have approached patient with the following individualized plan to manage diabetes and patient agrees.  -Continue  basal insulin Basaglar  80 units qhs, and  Novolog   18 units TIDAC for pre-meal BG readings of 90-150mg /dl, plus patient specific correction dose of rapid acting insulin for unexpected hyperglycemia above 150mg /dl, associated with strict monitoring of BG AC and HS.  -Adjustment parameters for hypo and hyperglycemia were given in a written document to patient. -Patient is encouraged to call clinic for blood glucose levels less than 70 or above 300 mg /dl.  I will lower metformin to 500 mg po BID due to CKD, and continue  Victoza  1.8mg  sq daily to advance as tolerated. -he did not succeed getting on DEXCOM.  - Patient specific target  for A1c; LDL, HDL, Triglycerides, and  Waist Circumference were discussed in detail.  2) BP/HTN: Controlled . Continue current medications including ACEI/ARB. 3) Lipids/HPL:  continue statins. 4)  Weight/Diet: gaining weight rapidly, CDE consult in progress, exercise, and carbohydrates information provided. I have discussed bariatric surgery and gave him information brochure.  5) Chronic Care/Health Maintenance:  -Patient is on ACEI/ARB and Statin medications and encouraged to continue to follow up with Ophthalmology, Podiatrist at least yearly or according to recommendations, and advised to  stay away from smoking. I have recommended yearly flu vaccine and pneumonia vaccination at least every 5 years; moderate intensity exercise for up to 150 minutes weekly; and  sleep for at least 7 hours a day.  - 25 minutes of time was spent on the care of this patient , 50% of which was applied  for counseling on diabetes complications and their preventions.  - I advised patient to maintain close follow up with HAWKINS,EDWARD L, MD for primary care needs.  Patient is asked to bring meter and  blood glucose logs during their next visit.    Follow up plan: -Return in about 3 months (around 11/21/2016) for follow up with pre-visit labs, meter, and logs.  Glade Lloyd, MD Phone: [phone]  Fax: [phone]   08/22/2016, 8:36 AM

## 2016-08-22 NOTE — Patient Instructions (Signed)

## 2016-09-16 ENCOUNTER — Other Ambulatory Visit: Payer: Self-pay | Admitting: "Endocrinology

## 2016-09-18 ENCOUNTER — Other Ambulatory Visit (HOSPITAL_COMMUNITY): Payer: Self-pay

## 2016-09-18 DIAGNOSIS — R609 Edema, unspecified: Secondary | ICD-10-CM

## 2016-09-18 MED ORDER — SPIRONOLACTONE 50 MG PO TABS: 50.0000 mg | ORAL_TABLET | Freq: Every day | ORAL | 2 refills | Status: DC

## 2016-09-18 NOTE — Telephone Encounter (Signed)
Received refill request from patients pharmacy for spironolactone. Reviewed with PA, chart checked and refilled.

## 2016-09-23 NOTE — Progress Notes (Signed)
Cardiology Office Note  Date: 09/24/2016   [name], DOB 1955/06/12, MRN [id number]  PCP: Sinda Du, MD  Primary Cardiologist: Rozann Lesches, MD   Chief Complaint  Patient presents with  . Coronary Artery Disease  . Chest Pain    History of Present Illness: Kelly [name] is a 61 y.o. male last seen in January 2017. He is referred back to the office by Dr. Luan Pulling with recurring chest pain symptoms. He is here with his wife. He states that he tries to remain active, still does welding and farm work. Over the last 2 months as he has been experiencing increasing episodes of chest aching with activity, was loading some 50 pound bags one day and had to stop due to the intensity of discomfort, symptoms went away within about 10 minutes. He has not taken any nitroglycerin. Feels that the frequency is increasing. He reports compliance with his medications.  Current cardiac regimen includes aspirin, Imdur, Cozaar, Lopressor, and Cozaar. Blood pressure and heart rate are adequately controlled today.  He continues to follow with Dr. Dorris Fetch for endocrinology assessment.   He has struggled to lose any weight. Considering bariatric surgery. Hemoglobin A1c was 7.1 in March.  Stress testing from 2013 is outlined below. He did have a region of scar with mild peri-infarct ischemia in the inferolateral wall at that time.  Today we discussed follow-up cardiac testing including noninvasive and invasive options. Recommendation is to proceed with a diagnostic cardiac catheterization for clear evaluation of coronary anatomy in light of progressing angina symptoms and long-standing diabetes history, his last coronary intervention was in 2001.  Past Medical History:  Diagnosis Date  . Anxiety   . Arthritis   . Coronary atherosclerosis of native coronary artery    BMS LAD and PTCA RCA 2000,BMS RCA/PLA 2001  . Diabetes mellitus   . Gastric ulcer   . GERD (gastroesophageal reflux disease)    . HTN (hypertension)   . Hyperlipidemia   . Low-grade NHL (non-Hodgkin's lymphoma)   . Lupus anticoagulant positive   . MI (myocardial infarction) ([place]) 2001  . Type 2 diabetes mellitus ([place])     Past Surgical History:  Procedure Laterality Date  . Axillary abscess     left incision and drainage left  . BACK SURGERY     multiple  . BIOPSY  08/15/2014   Procedure: BIOPSY;  Surgeon: Danie Binder, MD;  [facility]: [facility];  Service: Endoscopy;;  Gastric  . BIOPSY  01/19/2015   Procedure: BIOPSY (GASTRIC ULCER);  Surgeon: Danie Binder, MD;  Location: [facility];  Service: Endoscopy;;  . cardiac stents     x5 stents  . COLONOSCOPY  June 2008   Dr. Dellis Filbert Medoff: Mild left colonic diverticulosis  . Dental extractions    . ESOPHAGOGASTRODUODENOSCOPY  June 2008   Dr. Dellis Filbert Medoff: Gastric ulcer, antral, biopsy with reactive changes associated glandular atrophy, no H pylori. No features of lymphoma. Likely NSAID related  . ESOPHAGOGASTRODUODENOSCOPY  August 2008   Dr. Dellis Filbert Medoff: Complete healing of gastric ulcers.  . ESOPHAGOGASTRODUODENOSCOPY (EGD) WITH PROPOFOL N/A 08/15/2014   SLF: 1. Abnormal pain due to ulcers & gastriitis   . ESOPHAGOGASTRODUODENOSCOPY (EGD) WITH PROPOFOL N/A 01/19/2015   SLF: 1. Persistant ulcer with firm base 2. single gastric polyp found in the gastric antrum. Ulcer base   . EUS N/A 03/21/2015   Procedure: ESOPHAGEAL ENDOSCOPIC ULTRASOUND (EUS) RADIAL;  Surgeon: Arta Silence, MD;  [facility];  Service:  Endoscopy;  Laterality: N/A;  . Inguinal lymph node biopsy     right inguinal lymph node biopsy  . NECK SURGERY    . POLYPECTOMY  01/19/2015   Procedure: POLYPECTOMY (GASTRIC);  Surgeon: Danie Binder, MD;  Location: [facility];  Service: Endoscopy;;  . SHOULDER ARTHROSCOPY Left     Current Outpatient Prescriptions  Medication Sig Dispense Refill  . aspirin EC 81 MG tablet Take 81 mg by mouth daily.    . cholecalciferol (VITAMIN D) 1000 units  tablet Take 1,000 Units by mouth daily.    Marland Kitchen FLUoxetine (PROZAC) 20 MG capsule Take 20 mg by mouth daily.    Marland Kitchen gabapentin (NEURONTIN) 300 MG capsule Take 300 mg by mouth at bedtime.    . insulin aspart (NOVOLOG FLEXPEN) 100 UNIT/ML FlexPen Inject 18-24 Units into the skin 3 (three) times daily with meals. 30 mL 2  . Insulin Glargine (BASAGLAR KWIKPEN) 100 UNIT/ML SOPN INJECT 80 UNITS INTO THE SKIN DAILY AT 10 PM 30 mL 2  . isosorbide mononitrate (IMDUR) 30 MG 24 hr tablet TAKE 1 TABLET BY MOUTH EVERY DAY 30 tablet 6  . losartan (COZAAR) 100 MG tablet Take 100 mg by mouth daily.    . metFORMIN (GLUCOPHAGE) 500 MG tablet Take 1 tablet (500 mg total) by mouth 2 (two) times daily with a meal. 60 tablet 3  . metoprolol (LOPRESSOR) 50 MG tablet Take 25 mg by mouth 2 (two) times daily.      . nitroGLYCERIN (NITROSTAT) 0.4 MG SL tablet Place 1 tablet (0.4 mg total) under the tongue every 5 (five) minutes as needed. 25 tablet 3  . ONETOUCH VERIO test strip USE FOUR TIMES DAILY AS DIRECTED 450 each 0  . oxyCODONE (ROXICODONE) 15 MG immediate release tablet Take 15 mg by mouth every 4 (four) hours as needed for pain.    . pantoprazole (PROTONIX) 40 MG tablet TAKE 1 TABLET BY MOUTH BEFORE BREAKFAST 30 tablet 11  . simvastatin (ZOCOR) 10 MG tablet TAKE 1 TABLET BY MOUTH EVERY NIGHT AT BEDTIME 30 tablet 2  . spironolactone (ALDACTONE) 50 MG tablet Take 1 tablet (50 mg total) by mouth daily. 30 tablet 2  . VICTOZA 18 MG/3ML SOPN ADMINISTER 1.8 MG UNDER THE SKIN EVERY DAY 9 mL 2  . zolpidem (AMBIEN) 10 MG tablet Take 10 mg by mouth at bedtime as needed for sleep.     No current facility-administered medications for this visit.    Allergies:  Patient has no known allergies.   Social History: The patient  reports that he quit smoking about 22 years ago. His smoking use included Cigarettes. He started smoking about 45 years ago. He has a 23.00 pack-year smoking history. He has quit using smokeless tobacco. His  smokeless tobacco use included Snuff and Chew. He reports that he does not drink alcohol or use drugs.   Family History: The patient's family history includes Pancreatic cancer in his maternal grandmother.  ROS:  Please see the history of present illness. Otherwise, complete review of systems is positive for dyspnea on exertion.  All other systems are reviewed and negative.   Physical Exam: VS:  BP 118/60   Pulse 72   Ht 6\' 3"  (1.905 m)   Wt (!) 345 lb (156.5 kg)   SpO2 94%   BMI 43.12 kg/m , BMI Body mass index is 43.12 kg/m.  Wt Readings from Last 3 Encounters:  09/24/16 (!) 345 lb (156.5 kg)  08/22/16 (!) 352 lb (159.7 kg)  05/16/16 (!) 328 lb (148.8 kg)    General: Morbidly obese male, no active symptoms at rest. HEENT: Conjunctiva and lids normal, oropharynx clear. Neck: Supple, increased girth without obvious elevated JVP or carotid bruits, no thyromegaly. Lungs: Clear to auscultation, nonlabored breathing at rest. Cardiac: Regular rate and rhythm, no S3 or significant systolic murmur, no pericardial rub. Abdomen: Obese, nontender, bowel sounds present, no guarding or rebound. Extremities: No pitting edema, distal pulses 2+. Skin: Warm and dry.  Musculoskeletal: No kyphosis. Neuropsychiatric: Alert and oriented x3, affect grossly appropriate.  ECG: I personally reviewed the tracing from 06/01/2015 which showed sinus rhythm with prolonged PR interval, right wall branch block, and left anterior fascicular block.  Recent Labwork: 01/28/2016: TSH 1.35 08/11/2016: ALT 21; AST 22; BUN 30; Creat 1.37; Potassium 4.6; Sodium 135     Component Value Date/Time   CHOL 137 01/28/2016 1602   TRIG 281 (H) 01/28/2016 1602   HDL 40 01/28/2016 1602   CHOLHDL 3.4 01/28/2016 1602   VLDL 56 (H) 01/28/2016 1602   LDLCALC 41 01/28/2016 1602    Other Studies Reviewed Today:  Lexiscan Myoview 09/17/2011: IMPRESSION: Abnormal but overall low risk Lexiscan Myoview as outlined.  There were no  diagnostic ST-segment changes.  Perfusion imaging is consistent with an element of scar and mild periinfarct ischemia in the inferolateral wall, otherwise no definite anterior ischemia. LVEF is stable at 52% with inferior hypokinesis.  Assessment and Plan:  1. Accelerating angina symptoms as outlined above. Patient reports compliance with his medications and has adequate heart rate and blood pressure control at baseline. He reports worsening symptoms over the last 2 months.  We discussed follow-up cardiac testing including both noninvasive and invasive techniques. Decision is to proceed with diagnostic cardiac catheterization for clear evaluation of coronary anatomy and to assess for revascularization options. He is in agreement to proceed after reviewing the risks and benefits.  2. CAD status post BMS to the LAD and angioplasty of the RCA in 2000 followed by BMS to the RCA/PLA in 2001.  3. Morbid obesity. States that he is considering bariatric surgery.  4. Uncontrolled type 2 diabetes mellitus. He follows with Dr. Dorris Fetch. Recent hemoglobin A1c 7.1.  5. History of low-grade non-Hodgkin's lymphoma. He has been clinically stable and continues to follow with oncology, sees Dr. Tressie Stalker in [place].  6. Hyperlipidemia, continues on Zocor. Last LDL 41.  Current medicines were reviewed with the patient today.   Orders Placed This Encounter  Procedures  . DG Chest 2 View  . CBC with Differential  . Basic Metabolic Panel (BMET)  . INR/PT    Disposition: Follow-up after cardiac catheterization.  Signed, Satira Sark, MD, [facility] 09/24/2016 9:46 AM    [facility] [place]. [address], [place], [place] [address] Phone: [phone]; Fax: [phone]

## 2016-09-24 ENCOUNTER — Other Ambulatory Visit: Payer: Self-pay | Admitting: Cardiology

## 2016-09-24 ENCOUNTER — Other Ambulatory Visit (HOSPITAL_COMMUNITY)
Admission: RE | Admit: 2016-09-24 | Discharge: 2016-09-24 | Disposition: A | Discharge status: Home / Self Care | Payer: BC Managed Care – PPO | Source: Ambulatory Visit | Attending: Cardiology | Admitting: Cardiology

## 2016-09-24 ENCOUNTER — Encounter: Payer: Self-pay | Admitting: Cardiology

## 2016-09-24 ENCOUNTER — Ambulatory Visit (HOSPITAL_COMMUNITY)
Admission: RE | Admit: 2016-09-24 | Discharge: 2016-09-24 | Disposition: A | Discharge status: Home / Self Care | Payer: BC Managed Care – PPO | Source: Ambulatory Visit | Attending: Cardiology | Admitting: Cardiology

## 2016-09-24 ENCOUNTER — Ambulatory Visit (INDEPENDENT_AMBULATORY_CARE_PROVIDER_SITE_OTHER): Payer: BC Managed Care – PPO | Admitting: Cardiology

## 2016-09-24 VITALS — BP 118/60 | HR 72 | Ht 75.0 in | Wt 345.0 lb

## 2016-09-24 DIAGNOSIS — Z01818 Encounter for other preprocedural examination: Secondary | ICD-10-CM

## 2016-09-24 DIAGNOSIS — I25119 Atherosclerotic heart disease of native coronary artery with unspecified angina pectoris: Secondary | ICD-10-CM

## 2016-09-24 DIAGNOSIS — R918 Other nonspecific abnormal finding of lung field: Secondary | ICD-10-CM | POA: Insufficient documentation

## 2016-09-24 DIAGNOSIS — I200 Unstable angina: Secondary | ICD-10-CM

## 2016-09-24 DIAGNOSIS — E1165 Type 2 diabetes mellitus with hyperglycemia: Secondary | ICD-10-CM

## 2016-09-24 DIAGNOSIS — E782 Mixed hyperlipidemia: Secondary | ICD-10-CM

## 2016-09-24 DIAGNOSIS — E1159 Type 2 diabetes mellitus with other circulatory complications: Secondary | ICD-10-CM | POA: Diagnosis not present

## 2016-09-24 DIAGNOSIS — R609 Edema, unspecified: Secondary | ICD-10-CM | POA: Diagnosis not present

## 2016-09-24 LAB — CBC WITH DIFFERENTIAL/PLATELET
BASOS ABS: 0 10*3/uL (ref 0.0–0.1)
Basophils Relative: 0 %
EOS PCT: 3 %
Eosinophils Absolute: 0.2 10*3/uL (ref 0.0–0.7)
HCT: 40.7 % (ref 39.0–52.0)
HEMOGLOBIN: 14 g/dL (ref 13.0–17.0)
LYMPHS PCT: 21 %
Lymphs Abs: 1.4 10*3/uL (ref 0.7–4.0)
MCH: 32.9 pg (ref 26.0–34.0)
MCHC: 34.4 g/dL (ref 30.0–36.0)
MCV: 95.8 fL (ref 78.0–100.0)
Monocytes Absolute: 0.6 10*3/uL (ref 0.1–1.0)
Monocytes Relative: 9 %
NEUTROS PCT: 67 %
Neutro Abs: 4.5 10*3/uL (ref 1.7–7.7)
PLATELETS: 144 10*3/uL — AB (ref 150–400)
RBC: 4.25 MIL/uL (ref 4.22–5.81)
RDW: 12.3 % (ref 11.5–15.5)
WBC: 6.8 10*3/uL (ref 4.0–10.5)

## 2016-09-24 LAB — PROTIME-INR
INR: 1.04
Prothrombin Time: 13.6 seconds (ref 11.4–15.2)

## 2016-09-24 LAB — BASIC METABOLIC PANEL
Anion gap: 9 (ref 5–15)
BUN: 16 mg/dL (ref 6–20)
CHLORIDE: 100 mmol/L — AB (ref 101–111)
CO2: 25 mmol/L (ref 22–32)
CREATININE: 1.12 mg/dL (ref 0.61–1.24)
Calcium: 9 mg/dL (ref 8.9–10.3)
GFR calc Af Amer: >60 mL/min (ref >60)
GFR calc non Af Amer: >60 mL/min (ref >60)
Glucose, Bld: 287 mg/dL — ABNORMAL HIGH (ref 65–99)
Potassium: 4.2 mmol/L (ref 3.5–5.1)
Sodium: 134 mmol/L — ABNORMAL LOW (ref 135–145)

## 2016-09-24 MED ORDER — SPIRONOLACTONE 50 MG PO TABS: 50.0000 mg | ORAL_TABLET | Freq: Every day | ORAL | 3 refills | Status: DC

## 2016-09-24 NOTE — Patient Instructions (Signed)
Medication Instructions:  Your physician recommends that you continue on your current medications as directed. Please refer to the Current Medication list given to you today.   Labwork: Today   Testing/Procedures: A chest x-ray takes a picture of the organs and structures inside the chest, including the heart, lungs, and blood vessels. This test can show several things, including, whether the heart is enlarges; whether fluid is building up in the lungs; and whether pacemaker / defibrillator leads are still in place.   Follow-Up: Your physician recommends that you schedule a follow-up appointment in: 1 month post cath    Any Other Special Instructions Will Be Listed Below (If Applicable).     If you need a refill on your cardiac medications before your next appointment, please call your pharmacy.   [facility] [address] [place] [address] Dept: [phone] Loc: [place]  09/24/2016  You are scheduled for a Cardiac Catheterization on Friday, May 11 with Dr. Harrell Gave End.  1. Please arrive at the [facility] (Main Entrance A) at [facility]: [place],  [address] at 5:30 AM (two hours before your procedure to ensure your preparation). Free valet parking service is available.   Special note: Every effort is made to have your procedure done on time. Please understand that emergencies sometimes delay scheduled procedures.  2. Diet: Do not eat or drink anything after midnight prior to your procedure except sips of water to take medications.  3. Labs: TODAY ( Sep 24, 2016)   4. Medication instructions in preparation for your procedure:   Stop taking, Glucophage (Metformin), THURSDAY, Sep 25, 2016   ON THE NIGHT BEFORE PROCEDURE TAKE ONLY 1/2 DOSE OF YOUR INSULIN, NO INSULIN THE MORNING OF PROCEDURE.  On the morning of your procedure, take your  Aspirin and any morning medicines NOT listed above.  You may use sips of water.  5. Plan for one night stay--bring personal belongings. 6. Bring a current list of your medications and current insurance cards. 7. You MUST have a responsible person to drive you home. 8. Someone MUST be with you the first 24 hours after you arrive home or your discharge will be delayed. 9. Please wear clothes that are easy to get on and off and wear slip-on shoes.  Thank you for allowing [place] to care for you!   -- [facility]

## 2016-09-26 ENCOUNTER — Encounter (HOSPITAL_COMMUNITY)
Admission: RE | Disposition: A | Discharge status: Home / Self Care | Payer: Self-pay | Source: Ambulatory Visit | Attending: Internal Medicine

## 2016-09-26 ENCOUNTER — Ambulatory Visit (HOSPITAL_COMMUNITY)
Admission: RE | Admit: 2016-09-26 | Discharge: 2016-09-26 | Disposition: A | Discharge status: Home / Self Care | Payer: BC Managed Care – PPO | Source: Ambulatory Visit | Attending: Internal Medicine | Admitting: Internal Medicine

## 2016-09-26 ENCOUNTER — Other Ambulatory Visit: Payer: Self-pay | Admitting: Physician Assistant

## 2016-09-26 ENCOUNTER — Other Ambulatory Visit: Payer: Self-pay

## 2016-09-26 DIAGNOSIS — E119 Type 2 diabetes mellitus without complications: Secondary | ICD-10-CM | POA: Diagnosis not present

## 2016-09-26 DIAGNOSIS — T82855A Stenosis of coronary artery stent, initial encounter: Secondary | ICD-10-CM | POA: Insufficient documentation

## 2016-09-26 DIAGNOSIS — I10 Essential (primary) hypertension: Secondary | ICD-10-CM | POA: Insufficient documentation

## 2016-09-26 DIAGNOSIS — Z8572 Personal history of non-Hodgkin lymphomas: Secondary | ICD-10-CM | POA: Diagnosis not present

## 2016-09-26 DIAGNOSIS — I252 Old myocardial infarction: Secondary | ICD-10-CM | POA: Insufficient documentation

## 2016-09-26 DIAGNOSIS — Z87891 Personal history of nicotine dependence: Secondary | ICD-10-CM | POA: Insufficient documentation

## 2016-09-26 DIAGNOSIS — Z7982 Long term (current) use of aspirin: Secondary | ICD-10-CM | POA: Diagnosis not present

## 2016-09-26 DIAGNOSIS — F419 Anxiety disorder, unspecified: Secondary | ICD-10-CM | POA: Diagnosis not present

## 2016-09-26 DIAGNOSIS — Z794 Long term (current) use of insulin: Secondary | ICD-10-CM | POA: Diagnosis not present

## 2016-09-26 DIAGNOSIS — Z8719 Personal history of other diseases of the digestive system: Secondary | ICD-10-CM | POA: Diagnosis not present

## 2016-09-26 DIAGNOSIS — Z6841 Body Mass Index (BMI) 40.0 and over, adult: Secondary | ICD-10-CM | POA: Diagnosis not present

## 2016-09-26 DIAGNOSIS — I200 Unstable angina: Secondary | ICD-10-CM | POA: Diagnosis present

## 2016-09-26 DIAGNOSIS — I120 Hypertensive chronic kidney disease with stage 5 chronic kidney disease or end stage renal disease: Secondary | ICD-10-CM | POA: Diagnosis present

## 2016-09-26 DIAGNOSIS — I25110 Atherosclerotic heart disease of native coronary artery with unstable angina pectoris: Secondary | ICD-10-CM | POA: Diagnosis not present

## 2016-09-26 DIAGNOSIS — Y831 Surgical operation with implant of artificial internal device as the cause of abnormal reaction of the patient, or of later complication, without mention of misadventure at the time of the procedure: Secondary | ICD-10-CM | POA: Diagnosis not present

## 2016-09-26 DIAGNOSIS — M1990 Unspecified osteoarthritis, unspecified site: Secondary | ICD-10-CM | POA: Insufficient documentation

## 2016-09-26 DIAGNOSIS — K219 Gastro-esophageal reflux disease without esophagitis: Secondary | ICD-10-CM | POA: Diagnosis not present

## 2016-09-26 DIAGNOSIS — R760 Raised antibody titer: Secondary | ICD-10-CM | POA: Insufficient documentation

## 2016-09-26 DIAGNOSIS — I2582 Chronic total occlusion of coronary artery: Secondary | ICD-10-CM | POA: Diagnosis not present

## 2016-09-26 DIAGNOSIS — E785 Hyperlipidemia, unspecified: Secondary | ICD-10-CM | POA: Insufficient documentation

## 2016-09-26 DIAGNOSIS — I2510 Atherosclerotic heart disease of native coronary artery without angina pectoris: Secondary | ICD-10-CM

## 2016-09-26 DIAGNOSIS — I25119 Atherosclerotic heart disease of native coronary artery with unspecified angina pectoris: Secondary | ICD-10-CM

## 2016-09-26 HISTORY — PX: CORONARY PRESSURE/FFR STUDY: CATH118243

## 2016-09-26 HISTORY — PX: LEFT HEART CATH AND CORONARY ANGIOGRAPHY: CATH118249

## 2016-09-26 LAB — POCT ACTIVATED CLOTTING TIME
ACTIVATED CLOTTING TIME: 252 s
Activated Clotting Time: 268 seconds

## 2016-09-26 LAB — GLUCOSE, CAPILLARY
GLUCOSE-CAPILLARY: 191 mg/dL — AB (ref 65–99)
Glucose-Capillary: 210 mg/dL — ABNORMAL HIGH (ref 65–99)

## 2016-09-26 SURGERY — LEFT HEART CATH AND CORONARY ANGIOGRAPHY
Anesthesia: LOCAL

## 2016-09-26 MED ORDER — ADENOSINE (DIAGNOSTIC) 140MCG/KG/MIN
INTRAVENOUS | Status: DC | PRN
  Administered 2016-09-26: 140 ug/kg/min via INTRAVENOUS

## 2016-09-26 MED ORDER — SODIUM CHLORIDE 0.9% FLUSH: 3.0000 mL | Freq: Two times a day (BID) | INTRAVENOUS | Status: DC

## 2016-09-26 MED ORDER — SODIUM CHLORIDE 0.9% FLUSH: 3.0000 mL | INTRAVENOUS | Status: DC | PRN

## 2016-09-26 MED ORDER — LIDOCAINE HCL (PF) 1 % IJ SOLN
INTRAMUSCULAR | Status: DC | PRN
  Administered 2016-09-26: 1 mL

## 2016-09-26 MED ORDER — FENTANYL CITRATE (PF) 100 MCG/2ML IJ SOLN
INTRAMUSCULAR | Status: AC
  Filled 2016-09-26: qty 2

## 2016-09-26 MED ORDER — VERAPAMIL HCL 2.5 MG/ML IV SOLN
INTRAVENOUS | Status: DC | PRN
  Administered 2016-09-26: 10 mL via INTRA_ARTERIAL

## 2016-09-26 MED ORDER — HEPARIN (PORCINE) IN NACL 2-0.9 UNIT/ML-% IJ SOLN
INTRAMUSCULAR | Status: AC | PRN
  Administered 2016-09-26: 1000 mL

## 2016-09-26 MED ORDER — IOPAMIDOL (ISOVUE-370) INJECTION 76%
INTRAVENOUS | Status: DC | PRN
  Administered 2016-09-26: 120 mL via INTRA_ARTERIAL

## 2016-09-26 MED ORDER — ADENOSINE 12 MG/4ML IV SOLN
INTRAVENOUS | Status: AC
  Filled 2016-09-26: qty 16

## 2016-09-26 MED ORDER — INSULIN ASPART 100 UNIT/ML ~~LOC~~ SOLN
SUBCUTANEOUS | Status: AC
  Administered 2016-09-26: 19 [IU] via SUBCUTANEOUS
  Filled 2016-09-26: qty 1

## 2016-09-26 MED ORDER — HEPARIN SODIUM (PORCINE) 1000 UNIT/ML IJ SOLN
INTRAMUSCULAR | Status: AC
  Filled 2016-09-26: qty 1

## 2016-09-26 MED ORDER — HEPARIN SODIUM (PORCINE) 1000 UNIT/ML IJ SOLN
INTRAMUSCULAR | Status: DC | PRN
  Administered 2016-09-26: 3000 [IU] via INTRAVENOUS
  Administered 2016-09-26: 5000 [IU] via INTRAVENOUS
  Administered 2016-09-26: 8000 [IU] via INTRAVENOUS

## 2016-09-26 MED ORDER — ASPIRIN 81 MG PO CHEW: 81.0000 mg | CHEWABLE_TABLET | ORAL | Status: DC

## 2016-09-26 MED ORDER — IOPAMIDOL (ISOVUE-370) INJECTION 76%
INTRAVENOUS | Status: AC
  Filled 2016-09-26: qty 100

## 2016-09-26 MED ORDER — SODIUM CHLORIDE 0.9 % IV SOLN: INTRAVENOUS | Status: DC

## 2016-09-26 MED ORDER — INSULIN ASPART 100 UNIT/ML ~~LOC~~ SOLN
19.0000 [IU] | Freq: Once | SUBCUTANEOUS | Status: AC
  Administered 2016-09-26: 19 [IU] via SUBCUTANEOUS

## 2016-09-26 MED ORDER — SODIUM CHLORIDE 0.9 % IV SOLN: 250.0000 mL | INTRAVENOUS | Status: DC | PRN

## 2016-09-26 MED ORDER — MIDAZOLAM HCL 2 MG/2ML IJ SOLN
INTRAMUSCULAR | Status: AC
  Filled 2016-09-26: qty 2

## 2016-09-26 MED ORDER — FENTANYL CITRATE (PF) 100 MCG/2ML IJ SOLN
INTRAMUSCULAR | Status: DC | PRN
  Administered 2016-09-26: 25 ug via INTRAVENOUS
  Administered 2016-09-26: 50 ug via INTRAVENOUS

## 2016-09-26 MED ORDER — METFORMIN HCL 500 MG PO TABS: 500.0000 mg | ORAL_TABLET | Freq: Two times a day (BID) | ORAL | 3 refills | Status: DC

## 2016-09-26 MED ORDER — VERAPAMIL HCL 2.5 MG/ML IV SOLN
INTRAVENOUS | Status: AC
  Filled 2016-09-26: qty 2

## 2016-09-26 MED ORDER — MIDAZOLAM HCL 2 MG/2ML IJ SOLN
INTRAMUSCULAR | Status: DC | PRN
  Administered 2016-09-26: 1 mg via INTRAVENOUS

## 2016-09-26 MED ORDER — SODIUM CHLORIDE 0.9 % IV SOLN
INTRAVENOUS | Status: DC
Start: 2016-09-26 — End: 2016-09-26

## 2016-09-26 MED ORDER — IOPAMIDOL (ISOVUE-370) INJECTION 76%
INTRAVENOUS | Status: AC
  Filled 2016-09-26: qty 50

## 2016-09-26 SURGICAL SUPPLY — 19 items
CATH 5FR JL3.5 JR4 ANG PIG MP (CATHETERS) ×1 IMPLANT
CATH LAUNCHER 6FR EBU 3 (CATHETERS) ×1 IMPLANT
CATH MICROCATH NAVVUS (MICROCATHETER) IMPLANT
COVER PRB 48X5XTLSCP FOLD TPE (BAG) IMPLANT
COVER PROBE 5X48 (BAG) ×2
DEVICE RAD COMP TR BAND LRG (VASCULAR PRODUCTS) ×1 IMPLANT
GLIDESHEATH SLEND SS 6F .021 (SHEATH) ×1 IMPLANT
GUIDEWIRE INQWIRE 1.5J.035X260 (WIRE) IMPLANT
GUIDEWIRE PRESSURE COMET II (WIRE) ×1 IMPLANT
HOVERMATT SINGLE USE (MISCELLANEOUS) ×1 IMPLANT
INQWIRE 1.5J .035X260CM (WIRE) ×2
KIT ESSENTIALS PG (KITS) ×1 IMPLANT
KIT HEART LEFT (KITS) ×2 IMPLANT
MICROCATHETER NAVVUS (MICROCATHETER) ×2
PACK CARDIAC CATHETERIZATION (CUSTOM PROCEDURE TRAY) ×2 IMPLANT
SYR MEDRAD MARK V 150ML (SYRINGE) ×2 IMPLANT
TRANSDUCER W/STOPCOCK (MISCELLANEOUS) ×2 IMPLANT
TUBING CIL FLEX 10 FLL-RA (TUBING) ×2 IMPLANT
WIRE RUNTHROUGH .014X180CM (WIRE) ×1 IMPLANT

## 2016-09-26 NOTE — H&P (View-Only) (Signed)
Cardiology Office Note  Date: 09/24/2016   [name], DOB 12/31/1955, MRN [id number]  PCP: Sinda Du, MD  Primary Cardiologist: Rozann Lesches, MD   Chief Complaint  Patient presents with  . Coronary Artery Disease  . Chest Pain    History of Present Illness: Kelly [name] is a 61 y.o. male last seen in January 2017. He is referred back to the office by Dr. Luan Pulling with recurring chest pain symptoms. He is here with his wife. He states that he tries to remain active, still does welding and farm work. Over the last 2 months as he has been experiencing increasing episodes of chest aching with activity, was loading some 50 pound bags one day and had to stop due to the intensity of discomfort, symptoms went away within about 10 minutes. He has not taken any nitroglycerin. Feels that the frequency is increasing. He reports compliance with his medications.  Current cardiac regimen includes aspirin, Imdur, Cozaar, Lopressor, and Cozaar. Blood pressure and heart rate are adequately controlled today.  He continues to follow with Dr. Dorris Fetch for endocrinology assessment.   He has struggled to lose any weight. Considering bariatric surgery. Hemoglobin A1c was 7.1 in March.  Stress testing from 2013 is outlined below. He did have a region of scar with mild peri-infarct ischemia in the inferolateral wall at that time.  Today we discussed follow-up cardiac testing including noninvasive and invasive options. Recommendation is to proceed with a diagnostic cardiac catheterization for clear evaluation of coronary anatomy in light of progressing angina symptoms and long-standing diabetes history, his last coronary intervention was in 2001.  Past Medical History:  Diagnosis Date  . Anxiety   . Arthritis   . Coronary atherosclerosis of native coronary artery    BMS LAD and PTCA RCA 2000,BMS RCA/PLA 2001  . Diabetes mellitus   . Gastric ulcer   . GERD (gastroesophageal reflux disease)    . HTN (hypertension)   . Hyperlipidemia   . Low-grade NHL (non-Hodgkin's lymphoma)   . Lupus anticoagulant positive   . MI (myocardial infarction) ([place]) 2001  . Type 2 diabetes mellitus ([place])     Past Surgical History:  Procedure Laterality Date  . Axillary abscess     left incision and drainage left  . BACK SURGERY     multiple  . BIOPSY  08/15/2014   Procedure: BIOPSY;  Surgeon: Danie Binder, MD;  Location: [facility];  Service: Endoscopy;;  Gastric  . BIOPSY  01/19/2015   Procedure: BIOPSY (GASTRIC ULCER);  Surgeon: Danie Binder, MD;  Location: [facility];  Service: Endoscopy;;  . cardiac stents     x5 stents  . COLONOSCOPY  June 2008   Dr. Dellis Filbert Medoff: Mild left colonic diverticulosis  . Dental extractions    . ESOPHAGOGASTRODUODENOSCOPY  June 2008   Dr. Dellis Filbert Medoff: Gastric ulcer, antral, biopsy with reactive changes associated glandular atrophy, no H pylori. No features of lymphoma. Likely NSAID related  . ESOPHAGOGASTRODUODENOSCOPY  August 2008   Dr. Dellis Filbert Medoff: Complete healing of gastric ulcers.  . ESOPHAGOGASTRODUODENOSCOPY (EGD) WITH PROPOFOL N/A 08/15/2014   SLF: 1. Abnormal pain due to ulcers & gastriitis   . ESOPHAGOGASTRODUODENOSCOPY (EGD) WITH PROPOFOL N/A 01/19/2015   SLF: 1. Persistant ulcer with firm base 2. single gastric polyp found in the gastric antrum. Ulcer base   . EUS N/A 03/21/2015   Procedure: ESOPHAGEAL ENDOSCOPIC ULTRASOUND (EUS) RADIAL;  Surgeon: Arta Silence, MD;  Location: [facility];  Service:  Endoscopy;  Laterality: N/A;  . Inguinal lymph node biopsy     right inguinal lymph node biopsy  . NECK SURGERY    . POLYPECTOMY  01/19/2015   Procedure: POLYPECTOMY (GASTRIC);  Surgeon: Danie Binder, MD;  Location: [facility];  Service: Endoscopy;;  . SHOULDER ARTHROSCOPY Left     Current Outpatient Prescriptions  Medication Sig Dispense Refill  . aspirin EC 81 MG tablet Take 81 mg by mouth daily.    . cholecalciferol (VITAMIN D) 1000 units  tablet Take 1,000 Units by mouth daily.    Marland Kitchen FLUoxetine (PROZAC) 20 MG capsule Take 20 mg by mouth daily.    Marland Kitchen gabapentin (NEURONTIN) 300 MG capsule Take 300 mg by mouth at bedtime.    . insulin aspart (NOVOLOG FLEXPEN) 100 UNIT/ML FlexPen Inject 18-24 Units into the skin 3 (three) times daily with meals. 30 mL 2  . Insulin Glargine (BASAGLAR KWIKPEN) 100 UNIT/ML SOPN INJECT 80 UNITS INTO THE SKIN DAILY AT 10 PM 30 mL 2  . isosorbide mononitrate (IMDUR) 30 MG 24 hr tablet TAKE 1 TABLET BY MOUTH EVERY DAY 30 tablet 6  . losartan (COZAAR) 100 MG tablet Take 100 mg by mouth daily.    . metFORMIN (GLUCOPHAGE) 500 MG tablet Take 1 tablet (500 mg total) by mouth 2 (two) times daily with a meal. 60 tablet 3  . metoprolol (LOPRESSOR) 50 MG tablet Take 25 mg by mouth 2 (two) times daily.      . nitroGLYCERIN (NITROSTAT) 0.4 MG SL tablet Place 1 tablet (0.4 mg total) under the tongue every 5 (five) minutes as needed. 25 tablet 3  . ONETOUCH VERIO test strip USE FOUR TIMES DAILY AS DIRECTED 450 each 0  . oxyCODONE (ROXICODONE) 15 MG immediate release tablet Take 15 mg by mouth every 4 (four) hours as needed for pain.    . pantoprazole (PROTONIX) 40 MG tablet TAKE 1 TABLET BY MOUTH BEFORE BREAKFAST 30 tablet 11  . simvastatin (ZOCOR) 10 MG tablet TAKE 1 TABLET BY MOUTH EVERY NIGHT AT BEDTIME 30 tablet 2  . spironolactone (ALDACTONE) 50 MG tablet Take 1 tablet (50 mg total) by mouth daily. 30 tablet 2  . VICTOZA 18 MG/3ML SOPN ADMINISTER 1.8 MG UNDER THE SKIN EVERY DAY 9 mL 2  . zolpidem (AMBIEN) 10 MG tablet Take 10 mg by mouth at bedtime as needed for sleep.     No current facility-administered medications for this visit.    Allergies:  Patient has no known allergies.   Social History: The patient  reports that he quit smoking about 22 years ago. His smoking use included Cigarettes. He started smoking about 45 years ago. He has a 23.00 pack-year smoking history. He has quit using smokeless tobacco. His  smokeless tobacco use included Snuff and Chew. He reports that he does not drink alcohol or use drugs.   Family History: The patient's family history includes Pancreatic cancer in his maternal grandmother.  ROS:  Please see the history of present illness. Otherwise, complete review of systems is positive for dyspnea on exertion.  All other systems are reviewed and negative.   Physical Exam: VS:  BP 118/60   Pulse 72   Ht 6\' 3"  (1.905 m)   Wt (!) 345 lb (156.5 kg)   SpO2 94%   BMI 43.12 kg/m , BMI Body mass index is 43.12 kg/m.  Wt Readings from Last 3 Encounters:  09/24/16 (!) 345 lb (156.5 kg)  08/22/16 (!) 352 lb (159.7 kg)  05/16/16 (!) 328 lb (148.8 kg)    General: Morbidly obese male, no active symptoms at rest. HEENT: Conjunctiva and lids normal, oropharynx clear. Neck: Supple, increased girth without obvious elevated JVP or carotid bruits, no thyromegaly. Lungs: Clear to auscultation, nonlabored breathing at rest. Cardiac: Regular rate and rhythm, no S3 or significant systolic murmur, no pericardial rub. Abdomen: Obese, nontender, bowel sounds present, no guarding or rebound. Extremities: No pitting edema, distal pulses 2+. Skin: Warm and dry.  Musculoskeletal: No kyphosis. Neuropsychiatric: Alert and oriented x3, affect grossly appropriate.  ECG: I personally reviewed the tracing from 06/01/2015 which showed sinus rhythm with prolonged PR interval, right wall branch block, and left anterior fascicular block.  Recent Labwork: 01/28/2016: TSH 1.35 08/11/2016: ALT 21; AST 22; BUN 30; Creat 1.37; Potassium 4.6; Sodium 135     Component Value Date/Time   CHOL 137 01/28/2016 1602   TRIG 281 (H) 01/28/2016 1602   HDL 40 01/28/2016 1602   CHOLHDL 3.4 01/28/2016 1602   VLDL 56 (H) 01/28/2016 1602   LDLCALC 41 01/28/2016 1602    Other Studies Reviewed Today:  Lexiscan Myoview 09/17/2011: IMPRESSION: Abnormal but overall low risk Lexiscan Myoview as outlined.  There were no  diagnostic ST-segment changes.  Perfusion imaging is consistent with an element of scar and mild periinfarct ischemia in the inferolateral wall, otherwise no definite anterior ischemia. LVEF is stable at 52% with inferior hypokinesis.  Assessment and Plan:  1. Accelerating angina symptoms as outlined above. Patient reports compliance with his medications and has adequate heart rate and blood pressure control at baseline. He reports worsening symptoms over the last 2 months.  We discussed follow-up cardiac testing including both noninvasive and invasive techniques. Decision is to proceed with diagnostic cardiac catheterization for clear evaluation of coronary anatomy and to assess for revascularization options. He is in agreement to proceed after reviewing the risks and benefits.  2. CAD status post BMS to the LAD and angioplasty of the RCA in 2000 followed by BMS to the RCA/PLA in 2001.  3. Morbid obesity. States that he is considering bariatric surgery.  4. Uncontrolled type 2 diabetes mellitus. He follows with Dr. Dorris Fetch. Recent hemoglobin A1c 7.1.  5. History of low-grade non-Hodgkin's lymphoma. He has been clinically stable and continues to follow with oncology, sees Dr. Tressie Stalker in [place].  6. Hyperlipidemia, continues on Zocor. Last LDL 41.  Current medicines were reviewed with the patient today.   Orders Placed This Encounter  Procedures  . DG Chest 2 View  . CBC with Differential  . Basic Metabolic Panel (BMET)  . INR/PT    Disposition: Follow-up after cardiac catheterization.  Signed, Satira Sark, MD, [facility] 09/24/2016 9:46 AM    [facility] [place]. [address] [place], [place] [address] Phone: [phone]; Fax: [phone]

## 2016-09-26 NOTE — Brief Op Note (Signed)
Brief Cardiac Catheterization Note (Full Report to Follow)  Date: 09/26/2016 Time: 8:48 AM  PATIENT:  Kelly [name]  61 y.o. male  PRE-OPERATIVE DIAGNOSIS:  Accelerating angina  POST-OPERATIVE DIAGNOSIS:  Multivessel coronary artery disease  PROCEDURE:  Procedure(s): Left Heart Cath and Coronary Angiography (N/A) Intravascular Pressure Wire/FFR Study (N/A)  SURGEON:  Surgeon(s) and Role:    * Tyrae Alcoser, Harrell Gave, MD - Primary  FINDINGS: 1.  Significant 3-vessel coronary artery disease, including 70% mid LAD stenosis and 30-40% mid LAD ISR, 50%-60% proximal OM2 stenosis, chronic total occlusion of the ostial RCA, and moderate diffuse disease of the rPDA and rPL branches. 2.  Positive FFR of sequential mid LAD lesions (FFR 0.73). 3.  Normal left ventricular filling pressure. 4.  Normal left ventricular contraction.  RECOMMENDATIONS: 1.  Given diabetes and 3-vessel CAD, recommend surgical consultation for CABG. 2.  Continue secondary prevention.  Nelva Bush, MD [facility] Pager: [phone]

## 2016-09-26 NOTE — Interval H&P Note (Signed)
History and Physical Interval Note:  09/26/2016 6:53 AM  Kelly [name]  has presented today for cardiac catheterization, with the diagnosis of accelerating angina. The various methods of treatment have been discussed with the patient and family. After consideration of risks, benefits and other options for treatment, the patient has consented to  Procedure(s): Left Heart Cath and Coronary Angiography (N/A) as a surgical intervention .  The patient's history has been reviewed, patient examined, no change in status, stable for surgery.  I have reviewed the patient's chart and labs.  Questions were answered to the patient's satisfaction.    Cath Lab Visit (complete for each Cath Lab visit)  Clinical Evaluation Leading to the Procedure:   ACS: No.  Non-ACS:    Anginal Classification: CCS III  Anti-ischemic medical therapy: Maximal Therapy (2 or more classes of medications)  Non-Invasive Test Results: No non-invasive testing performed  Prior CABG: No previous CABG  Kelly [name]

## 2016-09-29 ENCOUNTER — Telehealth: Payer: Self-pay | Admitting: Cardiology

## 2016-09-29 ENCOUNTER — Encounter (HOSPITAL_COMMUNITY): Payer: Self-pay | Admitting: Internal Medicine

## 2016-09-29 NOTE — Discharge Instructions (Signed)
Return to Work ______[name]_____________________________________________ was treated at our facility. 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  Employee may return to modified work on ______5/14/18________________. Work activity restrictions Work activities that are not tolerated include: _x__Bending ___Prolonged sitting _X__Lifting more than ___5_____ lb ___Squatting _X__ Prolonged standing _X__Climbing ___Reaching ___Pushing and pulling ___ Walking ___Other ______________________ These restrictions are effective until ___Limited duties after 09/29/16 until after evaluation by MD__________________. Show this Return to Work statement to your supervisor at work as soon as possible. Your employer should be aware of your condition and can help with the necessary work activity restrictions. If you wish to return to work sooner than the date that is listed above, or if you have further problems that make it difficult for you to return at that time, please call our clinic or your health care provider. _________________________________________ Health Care Provider Name (printed) _________________________________________ Health Care Provider (signature) _________________________________________ Date This information is not intended to replace advice given to you by your health care provider. Make sure you discuss any questions you have with your health care provider. Document Released: 05/05/2005 Document Revised: 04/18/2016 Document Reviewed: 12/02/2013 Elsevier Interactive Patient Education  2017 [place] Refer to this sheet in the next few weeks. These instructions provide you with information about caring for yourself after your procedure. Your health care provider may also give you more specific instructions. Your treatment has been  planned according to current medical practices, but problems sometimes occur. Call your health care provider if you have any problems or questions after your procedure. What can I expect after the procedure? After your procedure, it is typical to have the following:  Bruising at the radial site that usually fades within 1-2 weeks.  Blood collecting in the tissue (hematoma) that may be painful to the touch. It should usually decrease in size and tenderness within 1-2 weeks. Follow these instructions at home:  Take medicines only as directed by your health care provider.  You may shower 24-48 hours after the procedure or as directed by your health care provider. Remove the bandage (dressing) and gently wash the site with plain soap and water. Pat the area dry with a clean towel. Do not rub the site, because this may cause bleeding.  Do not take baths, swim, or use a hot tub until your health care provider approves.  Check your insertion site every day for redness, swelling, or drainage.  Do not apply powder or lotion to the site.  Do not flex or bend the affected arm for 24 hours or as directed by your health care provider.  Do not push or pull heavy objects with the affected arm for 24 hours or as directed by your health care provider.  Do not lift over 10 lb (4.5 kg) for 5 days after your procedure or as directed by your health care provider.  Ask your health care provider when it is okay to:  Return to work or school.  Resume usual physical activities or sports.  Resume sexual activity.  Do not drive home if you are discharged the same day as the procedure. Have someone else drive you.  You may drive 24 hours after the procedure unless otherwise instructed by your health care provider.  Do not operate machinery or power tools for 24 hours after the procedure.  If your procedure was done as an outpatient procedure, which  means that you went home the same day as your procedure,  a responsible adult should be with you for the first 24 hours after you arrive home.  Keep all follow-up visits as directed by your health care provider. This is important. Contact a health care provider if:  You have a fever.  You have chills.  You have increased bleeding from the radial site. Hold pressure on the site. Get help right away if:  You have unusual pain at the radial site.  You have redness, warmth, or swelling at the radial site.  You have drainage (other than a small amount of blood on the dressing) from the radial site.  The radial site is bleeding, and the bleeding does not stop after 30 minutes of holding steady pressure on the site.  Your arm or hand becomes pale, cool, tingly, or numb. This information is not intended to replace advice given to you by your health care provider. Make sure you discuss any questions you have with your health care provider. Document Released: 06/07/2010 Document Revised: 10/11/2015 Document Reviewed: 11/21/2013 Elsevier Interactive Patient Education  2017 [employer].

## 2016-09-29 NOTE — Telephone Encounter (Signed)
Pt had a cath on Friday, was not told when he could start back taking his gabapentin (NEURONTIN) 300 MG capsule [[id number]] please give pt a call

## 2016-09-29 NOTE — Telephone Encounter (Signed)
I advised wife to call pcp regarding gabapentin, she agreed

## 2016-09-30 ENCOUNTER — Ambulatory Visit (HOSPITAL_COMMUNITY)
Admit: 2016-09-30 | Discharge: 2016-09-30 | Disposition: A | Discharge status: Home / Self Care | Payer: BC Managed Care – PPO | Attending: Physician Assistant | Admitting: Physician Assistant

## 2016-09-30 DIAGNOSIS — E119 Type 2 diabetes mellitus without complications: Secondary | ICD-10-CM | POA: Diagnosis not present

## 2016-09-30 DIAGNOSIS — E785 Hyperlipidemia, unspecified: Secondary | ICD-10-CM | POA: Insufficient documentation

## 2016-09-30 DIAGNOSIS — I10 Essential (primary) hypertension: Secondary | ICD-10-CM | POA: Insufficient documentation

## 2016-09-30 DIAGNOSIS — Z87891 Personal history of nicotine dependence: Secondary | ICD-10-CM | POA: Diagnosis not present

## 2016-09-30 DIAGNOSIS — I2510 Atherosclerotic heart disease of native coronary artery without angina pectoris: Secondary | ICD-10-CM | POA: Diagnosis present

## 2016-09-30 DIAGNOSIS — Z6841 Body Mass Index (BMI) 40.0 and over, adult: Secondary | ICD-10-CM | POA: Diagnosis not present

## 2016-09-30 MED ORDER — PERFLUTREN LIPID MICROSPHERE
1.0000 mL | INTRAVENOUS | Status: AC | PRN
  Administered 2016-09-30: 2 mL via INTRAVENOUS
  Filled 2016-09-30: qty 10

## 2016-09-30 NOTE — Progress Notes (Signed)
*  PRELIMINARY RESULTS* Echocardiogram 2D Echocardiogram with definity has been performed.  Leavy Cella 09/30/2016, 1:06 PM

## 2016-10-01 ENCOUNTER — Institutional Professional Consult (permissible substitution) (INDEPENDENT_AMBULATORY_CARE_PROVIDER_SITE_OTHER): Payer: BC Managed Care – PPO | Admitting: Cardiothoracic Surgery

## 2016-10-01 ENCOUNTER — Encounter: Payer: Self-pay | Admitting: Cardiothoracic Surgery

## 2016-10-01 VITALS — BP 114/66 | HR 79 | Resp 20 | Ht 75.0 in | Wt 345.0 lb

## 2016-10-01 DIAGNOSIS — I209 Angina pectoris, unspecified: Secondary | ICD-10-CM | POA: Diagnosis not present

## 2016-10-01 DIAGNOSIS — I2510 Atherosclerotic heart disease of native coronary artery without angina pectoris: Secondary | ICD-10-CM | POA: Diagnosis not present

## 2016-10-01 NOTE — Progress Notes (Addendum)
PCP is Sinda Du, MD Referring Provider is End, Harrell Gave, MD  Chief Complaint  Patient presents with  . Coronary Artery Disease    eval for CABG...CATH 09/26/16, ECHO 09/30/16  . Chest Pain    accelerating angina  Patient examined, most recent cardiac catheterization as well as echocardiogram personally reviewed and counseled with patient  HPI: 61 year old diabetic reformed smoker 350 pounds with recurrent angina after previous multiple PCI of LAD and RCA distribution. 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 Most recent cath shows LAD stents with moderate in-stent stenosis-significant by FFR. RCA is chronically occluded. 60% stenosis of a circumflex flex marginal. The patient has diffuse diabetic disease with small targets other than the LAD. However LV function is fairly well preserved and LVEDP is normal. Echo shows mild aortic stenosis with a mean gradient of 10 mmHg. No significant MR or TR.  The patient denies significant pulmonary disease or sleep apnea. PFTs are pending. The patient denies diabetic neuropathy or nephropathy. 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 No clinical history of jaundice or esophageal varices.  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 He does not drink alcohol. GI evaluation for cirrhosis is pending.   The patient has had history of previous DVT by history-  is bilateral with the worst DVT episode over 4 years ago in the left leg. Vein mapping studies are pending.  Last hemoglobin A1c was 7.1.  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 Followed by Dr. Abran Duke. Has not received chemotherapy. Also has lupus anticoagulant antibody.  Past Medical History:  Diagnosis Date  . Anxiety   . Arthritis    . Coronary atherosclerosis of native coronary artery    BMS LAD and PTCA RCA 2000,BMS RCA/PLA 2001  . Diabetes mellitus   . Gastric ulcer   . GERD (gastroesophageal reflux disease)   . HTN (hypertension)   . Hyperlipidemia   . Low-grade NHL (non-Hodgkin's lymphoma)   . Lupus anticoagulant positive   . MI (myocardial infarction) ([place]) 2001  . Type 2 diabetes mellitus ([place])     Past Surgical History:  Procedure Laterality Date  . Axillary abscess     left incision and drainage left  . BACK SURGERY     multiple  . BIOPSY  08/15/2014   Procedure: BIOPSY;  Surgeon: Danie Binder, MD;  Location: [facility];  Service: Endoscopy;;  Gastric  . BIOPSY  01/19/2015   Procedure: BIOPSY (GASTRIC ULCER);  Surgeon: Danie Binder, MD;  Location: [facility];  Service: Endoscopy;;  . cardiac stents     x5 stents  . COLONOSCOPY  June 2008   Dr. Dellis Filbert Medoff: Mild left colonic diverticulosis  . Dental extractions    . ESOPHAGOGASTRODUODENOSCOPY  June 2008   Dr. Dellis Filbert Medoff: Gastric ulcer, antral, biopsy with reactive changes associated glandular atrophy, no H pylori. No features of lymphoma. Likely NSAID related  . ESOPHAGOGASTRODUODENOSCOPY  August 2008   Dr. Dellis Filbert Medoff: Complete healing of gastric ulcers.  . ESOPHAGOGASTRODUODENOSCOPY (EGD) WITH PROPOFOL N/A 08/15/2014   SLF: 1. Abnormal pain due to ulcers & gastriitis   . ESOPHAGOGASTRODUODENOSCOPY (EGD) WITH PROPOFOL N/A 01/19/2015   SLF: 1. Persistant ulcer with firm base 2. single gastric polyp found in the gastric antrum. Ulcer base   .  EUS N/A 03/21/2015   Procedure: ESOPHAGEAL ENDOSCOPIC ULTRASOUND (EUS) RADIAL;  Surgeon: Arta Silence, MD;  Location: [facility];  Service: Endoscopy;  Laterality: N/A;  . Inguinal lymph node biopsy     right inguinal lymph node biopsy  . INTRAVASCULAR PRESSURE WIRE/FFR STUDY N/A 09/26/2016   Procedure: Intravascular Pressure Wire/FFR Study;  Surgeon: Nelva Bush, MD;  Location: [place] [facility];   Service: Cardiovascular;  Laterality: N/A;  . LEFT HEART CATH AND CORONARY ANGIOGRAPHY N/A 09/26/2016   Procedure: Left Heart Cath and Coronary Angiography;  Surgeon: Nelva Bush, MD;  Location: [facility] [facility];  Service: Cardiovascular;  Laterality: N/A;  . NECK SURGERY    . POLYPECTOMY  01/19/2015   Procedure: POLYPECTOMY (GASTRIC);  Surgeon: Danie Binder, MD;  Location: [facility];  Service: Endoscopy;;  . SHOULDER ARTHROSCOPY Left     Family History  Problem Relation Age of Onset  . Pancreatic cancer Maternal Grandmother   . Colon cancer Neg Hx     Social History Social History  Substance Use Topics  . Smoking status: Former Smoker    Packs/day: 1.00    Years: 23.00    Types: Cigarettes    Start date: 10/30/1970    Quit date: 10/29/1993  . Smokeless tobacco: Former Systems developer    Types: Snuff, Chew  . Alcohol use No    Current Outpatient Prescriptions  Medication Sig Dispense Refill  . aspirin EC 81 MG tablet Take 81 mg by mouth daily.    . cholecalciferol (VITAMIN D) 1000 units tablet Take 1,000 Units by mouth daily.    Marland Kitchen FLUoxetine (PROZAC) 20 MG capsule Take 20 mg by mouth daily.    Marland Kitchen gabapentin (NEURONTIN) 300 MG capsule Take 300 mg by mouth at bedtime.    . insulin aspart (NOVOLOG FLEXPEN) 100 UNIT/ML FlexPen Inject 18-24 Units into the skin 3 (three) times daily with meals. 30 mL 2  . Insulin Glargine (BASAGLAR KWIKPEN) 100 UNIT/ML SOPN INJECT 80 UNITS INTO THE SKIN DAILY AT 10 PM 30 mL 2  . isosorbide mononitrate (IMDUR) 30 MG 24 hr tablet TAKE 1 TABLET BY MOUTH EVERY DAY 30 tablet 6  . losartan (COZAAR) 100 MG tablet Take 100 mg by mouth daily.    . metFORMIN (GLUCOPHAGE) 500 MG tablet Take 1 tablet (500 mg total) by mouth 2 (two) times daily with a meal. 60 tablet 3  . metoprolol (LOPRESSOR) 50 MG tablet Take 25 mg by mouth 2 (two) times daily.      . nitroGLYCERIN (NITROSTAT) 0.4 MG SL tablet Place 1 tablet (0.4 mg total) under the tongue every 5 (five) minutes as  needed. 25 tablet 3  . ONETOUCH VERIO test strip USE FOUR TIMES DAILY AS DIRECTED 450 each 0  . oxyCODONE (ROXICODONE) 15 MG immediate release tablet Take 15 mg by mouth every 4 (four) hours as needed for pain.    . pantoprazole (PROTONIX) 40 MG tablet TAKE 1 TABLET BY MOUTH BEFORE BREAKFAST 30 tablet 11  . simvastatin (ZOCOR) 10 MG tablet TAKE 1 TABLET BY MOUTH EVERY NIGHT AT BEDTIME 30 tablet 2  . spironolactone (ALDACTONE) 50 MG tablet Take 1 tablet (50 mg total) by mouth daily. 90 tablet 3  . VICTOZA 18 MG/3ML SOPN ADMINISTER 1.8 MG UNDER THE SKIN EVERY DAY 9 mL 2  . zolpidem (AMBIEN) 10 MG tablet Take 10 mg by mouth at bedtime as needed for sleep.     No current facility-administered medications for this visit.  No Known Allergies  Review of Systems         Review of Systems :  [ y ] = yes, [  ] = no        General :  Weight gain [   ]    Weight loss  [   ]  Fatigue [ yes ]  Fever [  ]  Chills  [  ]                                Weakness  [  ]           HEENT    Headache [  ]  Dizziness [  ]  Blurred vision [  ] Glaucoma  [  ]                          Nosebleeds [  ] Painful or loose teeth [all teeth have been removed upper and lower plates]        Cardiac :  Chest pain/ pressure [place]  ]  Resting SOB [  ] exertional SOB [mild  ]                        Orthopnea [  ]  Pedal edema  [wears support hose  ]  Palpitations [  ] Syncope/presyncope [ ]                         Paroxysmal nocturnal dyspnea [  ]         Pulmonary : cough [  ]  wheezing [  ]  Hemoptysis [  ] Sputum [  ] Snoring [  ]                              Pneumothorax [  ]  Sleep apnea [  ]        GI : Vomiting [  ]  Dysphagia [  ]  Melena  [  ]  Abdominal pain [  ] BRBPR [  ]benign gastric ulcer-treated              Heart burn [  ]  Constipation [  ] Diarrhea  [  ] Colonoscopy [   ]        GU : Hematuria [  ]  Dysuria [  ]  Nocturia [  ] UTI's [  ]        Vascular : Claudication [  ]  Rest pain [  ]  DVT [  ]  Vein stripping [  ] leg ulcers [  ]                          TIA [  ] Stroke [  ]  Varicose veins [  ]        NEURO :  Headaches  [  ] Seizures [  ] Vision changes [  ] Paresthesias [  ]                                       Seizures [  ]  Musculoskeletal :  Arthritis [yes lower back and neck surgery  ] Gout  [  ]  Back pain [place]  ]  Joint pain [  ] right rotator cuff tear need surgery        Skin :  Rash [  ]  Melanoma [  ] Sores [  ]        Heme : Bleeding problems [  ]Clotting Disorders [  ] Anemia [  ]Blood Transfusion [ ] history of B-cell lymphoma and lupus anticoagulant antibody        Endocrine : Diabetes [  ] Heat or Cold intolerance [  ] Polyuria [  ]excessive thirst [ ]         Psych : Depression [  ]  Anxiety [  ]  Psych hospitalizations [  ] Memory change [  ]      No history of thoracic trauma       Right-hand dominant                                         BP 114/66   Pulse 79   Resp 20   Ht 6\' 3"  (1.905 m)   Wt (!) 345 lb (156.5 kg)   SpO2 93%   BMI 43.12 kg/m  Physical Exam      Physical Exam  General: Pleasant morbidly obese male no acute distress HEENT: Normocephalic pupils equal , dentition plates Neck: Supple without JVD, adenopathy, or bruit Chest: Clear to auscultation, symmetrical breath sounds, no rhonchi, no tenderness             or deformity Cardiovascular: Regular rate and rhythm, 2/6 SEM murmur, no gallop, peripheral pulses             palpable in all extremities Abdomen:  Soft, nontender, no palpable mass or organomegaly Extremities: Warm, well-perfused, no clubbing cyanosis edema or tenderness,              Mildenous stasis changes of the legs Rectal/GU: Deferred Neuro: Grossly non--focal and symmetrical throughout Skin: Clean and dry without rash or ulceration   Diagnostic Tests:  three-vessel CAD  Suboptimal targets for grafting    Preserved LV systolic function  Impression:  patient would be at increased risk for CABG  because of his obesity, cirrhosis, and poor quality target vessels.  However his cardiologist did not feel that further interventional therapy would be an official and the patient's angina does seem to be progressing.  Plan: patient will obtain vein mapping to assess adequacy of leg conduit, PFTs, and see his gastroenterologist for clearance regarding his hepatic disease. He will need a room air blood gas and ammonia level. He'll return for further discussion of surgery after these evaluations are completed. 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    Len Childs, MD [facility] [phone]

## 2016-10-02 ENCOUNTER — Other Ambulatory Visit: Payer: Self-pay | Admitting: *Deleted

## 2016-10-02 ENCOUNTER — Encounter: Payer: Self-pay | Admitting: Cardiothoracic Surgery

## 2016-10-02 DIAGNOSIS — K7460 Unspecified cirrhosis of liver: Secondary | ICD-10-CM

## 2016-10-02 DIAGNOSIS — Z01818 Encounter for other preprocedural examination: Secondary | ICD-10-CM

## 2016-10-06 ENCOUNTER — Encounter: Payer: Self-pay | Admitting: Gastroenterology

## 2016-10-06 ENCOUNTER — Ambulatory Visit (INDEPENDENT_AMBULATORY_CARE_PROVIDER_SITE_OTHER): Payer: BC Managed Care – PPO | Admitting: Gastroenterology

## 2016-10-06 VITALS — BP 123/64 | HR 81 | Temp 98.3°F | Ht 75.0 in | Wt 341.6 lb

## 2016-10-06 DIAGNOSIS — K7460 Unspecified cirrhosis of liver: Secondary | ICD-10-CM | POA: Diagnosis not present

## 2016-10-06 NOTE — Assessment & Plan Note (Addendum)
61 year old male with history of well-compensated cirrhosis secondary to fatty liver, historically Child Pugh A. Last MELD 8 in Nov 2017. He is due for hepatoma screening. No varices noted on last EGD in 2016, and he has a history of PUD that has been thoroughly evaluated in 2016 to include EUS. GI clearance requested prior to CABG due to known cirrhosis. I have ordered updated ultrasound of abdomen and labs to calculate most up-to-date Child Pugh score and MELD. Will also review with Dr. Oneida Alar, who is the GI attending. Further recommendations to follow.

## 2016-10-06 NOTE — Patient Instructions (Signed)
I have ordered blood work for you to take with you today to complete.  We have ordered an ultrasound as well for routine screening purposes.  I will make sure there is nothing else needing to be done prior to surgery after talking with Dr. Oneida Alar.

## 2016-10-06 NOTE — Progress Notes (Addendum)
REVIEWED.  POST-OPERATIVE Mortality RISK BASED ON THE DIAGNOSIS OF CIRRHOSIS 7 days 30 days 90 days 1 year 5 years  1.748 % 6.979 % 11.028 % 24.27 % 53.688%      Referring Provider: Sinda Du, MD Primary Care Physician:  Sinda Du, MD Primary GI: Dr. Oneida Alar   Chief Complaint  Patient presents with  . Cirrhosis    needs GI clearance for open heart surgery    HPI:   Kelly [name] is a 61 y.o. male presenting today with a history of cirrhosis likely secondary to fatty liver, due for EGD for variceal screening in 2018. US abdomen due now for hepatoma screening. He has had progressive angina and has multivessel CAD. Cardiology has felt medical therapy has been maximized, and he recently saw Dr. Tharon Aquas Trigt for evaluation of CABG. Due to his obesity, cirrhosis, and poor quality target vessels, he is at increased risk of complications. Patient hopes to have surgery sometime in July. He has been referred to see GI for clearance prior to surgery.   No abdominal pain, no N/V. No rectal bleeding. No dysphagia. No confusion or mental status changes. Has had hepatitis B vaccinations. Immune to Hep A. Hep C antibody negative. Historically, he has had well-compensated disease, Child Pugh A.   Past Medical History:  Diagnosis Date  . Anxiety   . Arthritis   . Coronary atherosclerosis of native coronary artery    BMS LAD and PTCA RCA 2000,BMS RCA/PLA 2001  . Diabetes mellitus   . Gastric ulcer   . GERD (gastroesophageal reflux disease)   . HTN (hypertension)   . Hyperlipidemia   . Low-grade NHL (non-Hodgkin's lymphoma)   . Lupus anticoagulant positive   . MI (myocardial infarction) ([place]) 2001  . Type 2 diabetes mellitus ([place])     Past Surgical History:  Procedure Laterality Date  . Axillary abscess     left incision and drainage left  . BACK SURGERY     multiple  . BIOPSY  08/15/2014   Procedure: BIOPSY;  Surgeon: Danie Binder, MD;  Location: [facility];  Service:  Endoscopy;;  Gastric  . BIOPSY  01/19/2015   Procedure: BIOPSY (GASTRIC ULCER);  Surgeon: Danie Binder, MD;  Location: [facility];  Service: Endoscopy;;  . cardiac stents     x5 stents  . COLONOSCOPY  June 2008   Dr. Dellis Filbert Medoff: Mild left colonic diverticulosis  . Dental extractions    . ESOPHAGOGASTRODUODENOSCOPY  June 2008   Dr. Dellis Filbert Medoff: Gastric ulcer, antral, biopsy with reactive changes associated glandular atrophy, no H pylori. No features of lymphoma. Likely NSAID related  . ESOPHAGOGASTRODUODENOSCOPY  August 2008   Dr. Dellis Filbert Medoff: Complete healing of gastric ulcers.  . ESOPHAGOGASTRODUODENOSCOPY (EGD) WITH PROPOFOL N/A 08/15/2014   SLF: 1. Abnormal pain due to ulcers & gastriitis   . ESOPHAGOGASTRODUODENOSCOPY (EGD) WITH PROPOFOL N/A 01/19/2015   SLF: 1. Persistant ulcer with firm base 2. single gastric polyp found in the gastric antrum. Ulcer base   . EUS N/A 03/21/2015   Thickened gastric wall in pre-pyloric antrum, no obvious intramural mass, fatty pancreas, no obvious pancreatic mass.   . Inguinal lymph node biopsy     right inguinal lymph node biopsy  . INTRAVASCULAR PRESSURE WIRE/FFR STUDY N/A 09/26/2016   Procedure: Intravascular Pressure Wire/FFR Study;  Surgeon: Nelva Bush, MD;  Location: [place] [facility];  Service: Cardiovascular;  Laterality: N/A;  . LEFT HEART CATH AND CORONARY ANGIOGRAPHY N/A 09/26/2016  Procedure: Left Heart Cath and Coronary Angiography;  Surgeon: Nelva Bush, MD;  Location: [place] [facility];  Service: Cardiovascular;  Laterality: N/A;  . NECK SURGERY    . POLYPECTOMY  01/19/2015   Procedure: POLYPECTOMY (GASTRIC);  Surgeon: Danie Binder, MD;  Location: [facility];  Service: Endoscopy;;  . SHOULDER ARTHROSCOPY Left     Current Outpatient Prescriptions  Medication Sig Dispense Refill  . aspirin EC 81 MG tablet Take 81 mg by mouth daily.    . cholecalciferol (VITAMIN D) 1000 units tablet Take 1,000 Units by mouth daily.    Marland Kitchen  FLUoxetine (PROZAC) 20 MG capsule Take 20 mg by mouth daily.    Marland Kitchen gabapentin (NEURONTIN) 300 MG capsule Take 300 mg by mouth at bedtime.    . insulin aspart (NOVOLOG FLEXPEN) 100 UNIT/ML FlexPen Inject 18-24 Units into the skin 3 (three) times daily with meals. 30 mL 2  . Insulin Glargine (BASAGLAR KWIKPEN) 100 UNIT/ML SOPN INJECT 80 UNITS INTO THE SKIN DAILY AT 10 PM 30 mL 2  . isosorbide mononitrate (IMDUR) 30 MG 24 hr tablet TAKE 1 TABLET BY MOUTH EVERY DAY 30 tablet 6  . losartan (COZAAR) 100 MG tablet Take 100 mg by mouth daily.    . metFORMIN (GLUCOPHAGE) 500 MG tablet Take 1 tablet (500 mg total) by mouth 2 (two) times daily with a meal. 60 tablet 3  . metoprolol (LOPRESSOR) 50 MG tablet Take 25 mg by mouth 2 (two) times daily.      . nitroGLYCERIN (NITROSTAT) 0.4 MG SL tablet Place 1 tablet (0.4 mg total) under the tongue every 5 (five) minutes as needed. 25 tablet 3  . ONETOUCH VERIO test strip USE FOUR TIMES DAILY AS DIRECTED 450 each 0  . oxyCODONE (ROXICODONE) 15 MG immediate release tablet Take 15 mg by mouth every 4 (four) hours as needed for pain.    . pantoprazole (PROTONIX) 40 MG tablet TAKE 1 TABLET BY MOUTH BEFORE BREAKFAST 30 tablet 11  . simvastatin (ZOCOR) 10 MG tablet TAKE 1 TABLET BY MOUTH EVERY NIGHT AT BEDTIME 30 tablet 2  . spironolactone (ALDACTONE) 50 MG tablet Take 1 tablet (50 mg total) by mouth daily. 90 tablet 3  . VICTOZA 18 MG/3ML SOPN ADMINISTER 1.8 MG UNDER THE SKIN EVERY DAY 9 mL 2  . zolpidem (AMBIEN) 10 MG tablet Take 10 mg by mouth at bedtime as needed for sleep.     No current facility-administered medications for this visit.     Allergies as of 10/06/2016  . (No Known Allergies)    Family History  Problem Relation Age of Onset  . Pancreatic cancer Maternal Grandmother   . Colon cancer Neg Hx     Social History   Social History  . Marital status: Married    Spouse name: N/A  . Number of children: N/A  . Years of education: N/A   Social  History Main Topics  . Smoking status: Former Smoker    Packs/day: 1.00    Years: 23.00    Types: Cigarettes    Start date: 10/30/1970    Quit date: 10/29/1993  . Smokeless tobacco: Former Systems developer    Types: Snuff, Chew  . Alcohol use No  . Drug use: No  . Sexual activity: No   Other Topics Concern  . None   Social History Narrative  . None    Review of Systems: Gen: Denies fever, chills, anorexia. Denies fatigue, weakness, weight loss.  CV: +chest pain intermittently. Resp: +  DOE  GI: see HPI  Derm: Denies rash, itching, dry skin Psych: Denies depression, anxiety, memory loss, confusion. No homicidal or suicidal ideation.  Heme: see HPI   Physical Exam: BP 123/64   Pulse 81   Temp 98.3 F (36.8 C) (Oral)   Ht 6\' 3"  (1.905 m)   Wt (!) 341 lb 9.6 oz (154.9 kg)   BMI 42.70 kg/m  General:   Alert and oriented. No distress noted. Pleasant and cooperative.  Head:  Normocephalic and atraumatic. Eyes:  Conjuctiva clear without scleral icterus. Mouth:  Oral mucosa pink and moist. Good dentition. No lesions. Heart:  S1, S2 present with systolic murmur.  Abdomen:  +BS, soft, obese, difficult to appreciate HSM due to large AP diameter. Rectus diastasis.  Msk:  Symmetrical without gross deformities. Normal posture. Extremities:  Without edema. Neurologic:  Alert and  oriented x4;  grossly normal neurologically. Skin:  Intact without significant lesions or rashes. Psych:  Alert and cooperative. Normal mood and affect.  Lab Results  Component Value Date   WBC 6.8 09/24/2016   HGB 14.0 09/24/2016   HCT 40.7 09/24/2016   MCV 95.8 09/24/2016   PLT 144 (L) 09/24/2016   Lab Results  Component Value Date   CREATININE 1.12 09/24/2016   BUN 16 09/24/2016   NA 134 (L) 09/24/2016   K 4.2 09/24/2016   CL 100 (L) 09/24/2016   CO2 25 09/24/2016

## 2016-10-07 ENCOUNTER — Other Ambulatory Visit: Payer: Self-pay | Admitting: *Deleted

## 2016-10-07 DIAGNOSIS — Z01810 Encounter for preprocedural cardiovascular examination: Secondary | ICD-10-CM

## 2016-10-07 LAB — PROTIME-INR
INR: 1.1
Prothrombin Time: 11.8 s — ABNORMAL HIGH (ref 9.0–11.5)

## 2016-10-07 LAB — COMPLETE METABOLIC PANEL WITH GFR
ALK PHOS: 59 U/L (ref 40–115)
ALT: 16 U/L (ref 9–46)
AST: 19 U/L (ref 10–35)
Albumin: 3.8 g/dL (ref 3.6–5.1)
BUN: 19 mg/dL (ref 7–25)
CO2: 23 mmol/L (ref 20–31)
Calcium: 9.4 mg/dL (ref 8.6–10.3)
Chloride: 101 mmol/L (ref 98–110)
Creat: 1.22 mg/dL (ref 0.70–1.33)
GFR, EST AFRICAN AMERICAN: 75 mL/min (ref >=60)
GFR, EST NON AFRICAN AMERICAN: 65 mL/min (ref >=60)
GLUCOSE: 169 mg/dL — AB (ref 65–99)
Potassium: 4.4 mmol/L (ref 3.5–5.3)
SODIUM: 137 mmol/L (ref 135–146)
Total Bilirubin: 0.5 mg/dL (ref 0.2–1.2)
Total Protein: 7.1 g/dL (ref 6.1–8.1)

## 2016-10-07 LAB — AMMONIA: Ammonia: 55 umol/L — ABNORMAL HIGH (ref <=47)

## 2016-10-07 NOTE — Progress Notes (Signed)
cc'ed to pcp °

## 2016-10-09 ENCOUNTER — Ambulatory Visit (HOSPITAL_COMMUNITY)
Admission: RE | Admit: 2016-10-09 | Discharge: 2016-10-09 | Disposition: A | Discharge status: Home / Self Care | Payer: BC Managed Care – PPO | Source: Ambulatory Visit | Attending: Gastroenterology | Admitting: Gastroenterology

## 2016-10-09 DIAGNOSIS — K7460 Unspecified cirrhosis of liver: Secondary | ICD-10-CM | POA: Diagnosis present

## 2016-10-09 DIAGNOSIS — Z1389 Encounter for screening for other disorder: Secondary | ICD-10-CM | POA: Diagnosis present

## 2016-10-09 DIAGNOSIS — K769 Liver disease, unspecified: Secondary | ICD-10-CM | POA: Diagnosis not present

## 2016-10-09 NOTE — Progress Notes (Signed)
PT is aware.

## 2016-10-09 NOTE — Progress Notes (Signed)
MELD 12, Child-Pugh A. Please let patient know that his ultrasound did not show any concerning findings. Known cirrhosis. We can't give clearance for surgery, but we can give estimated risks, which Dr. Oneida Alar has outlined in an office note addendum. No other procedures needed from our standpoint in preparation for cardiac surgery. Needs to follow-up with [place] thereafter for routine cirrhosis care.

## 2016-10-14 ENCOUNTER — Ambulatory Visit (HOSPITAL_COMMUNITY)
Admission: RE | Admit: 2016-10-14 | Discharge: 2016-10-14 | Disposition: A | Discharge status: Home / Self Care | Payer: BC Managed Care – PPO | Source: Ambulatory Visit | Attending: Cardiothoracic Surgery | Admitting: Cardiothoracic Surgery

## 2016-10-14 ENCOUNTER — Ambulatory Visit (HOSPITAL_COMMUNITY)
Admission: RE | Admit: 2016-10-14 | Discharge: 2016-10-14 | Disposition: A | Discharge status: Home / Self Care | Payer: BC Managed Care – PPO | Source: Ambulatory Visit | Attending: Vascular Surgery | Admitting: Vascular Surgery

## 2016-10-14 DIAGNOSIS — Z01818 Encounter for other preprocedural examination: Secondary | ICD-10-CM

## 2016-10-14 LAB — PULMONARY FUNCTION TEST
DL/VA % pred: 76 %
DL/VA: 3.75 ml/min/mmHg/L
DLCO unc % pred: 52 %
DLCO unc: 20.51 ml/min/mmHg
FEF 25-75 Post: 2.63 L/sec
FEF 25-75 Pre: 3.07 L/sec
FEF2575-%Change-Post: -14 %
FEF2575-%Pred-Post: 75 %
FEF2575-%Pred-Pre: 87 %
FEV1-%Change-Post: 2 %
FEV1-%Pred-Post: 68 %
FEV1-%Pred-Pre: 67 %
FEV1-Post: 2.95 L
FEV1-Pre: 2.88 L
FEV1FVC-%Change-Post: -2 %
FEV1FVC-%Pred-Pre: 107 %
FEV6-%Change-Post: 5 %
FEV6-%Pred-Post: 68 %
FEV6-%Pred-Pre: 64 %
FEV6-Post: 3.7 L
FEV6-Pre: 3.52 L
FEV6FVC-%Pred-Post: 104 %
FEV6FVC-%Pred-Pre: 104 %
FVC-%Change-Post: 5 %
FVC-%Pred-Post: 65 %
FVC-%Pred-Pre: 62 %
FVC-Post: 3.7 L
FVC-Pre: 3.52 L
Post FEV1/FVC ratio: 80 %
Post FEV6/FVC ratio: 100 %
Pre FEV1/FVC ratio: 82 %
Pre FEV6/FVC Ratio: 100 %

## 2016-10-14 MED ORDER — ALBUTEROL SULFATE (2.5 MG/3ML) 0.083% IN NEBU
2.5000 mg | INHALATION_SOLUTION | Freq: Once | RESPIRATORY_TRACT | Status: AC
  Administered 2016-10-14: 2.5 mg via RESPIRATORY_TRACT

## 2016-10-22 ENCOUNTER — Encounter: Payer: Self-pay | Admitting: Cardiothoracic Surgery

## 2016-10-23 ENCOUNTER — Ambulatory Visit (INDEPENDENT_AMBULATORY_CARE_PROVIDER_SITE_OTHER): Payer: BC Managed Care – PPO | Admitting: Cardiothoracic Surgery

## 2016-10-23 ENCOUNTER — Other Ambulatory Visit: Payer: Self-pay | Admitting: *Deleted

## 2016-10-23 ENCOUNTER — Encounter: Payer: Self-pay | Admitting: Cardiothoracic Surgery

## 2016-10-23 VITALS — BP 131/71 | HR 76 | Resp 18 | Ht 75.0 in | Wt 341.0 lb

## 2016-10-23 DIAGNOSIS — I209 Angina pectoris, unspecified: Secondary | ICD-10-CM

## 2016-10-23 DIAGNOSIS — I2510 Atherosclerotic heart disease of native coronary artery without angina pectoris: Secondary | ICD-10-CM | POA: Diagnosis not present

## 2016-10-23 NOTE — Progress Notes (Signed)
PCP is Sinda Du, MD Referring Provider is End, Harrell Gave, MD  Chief Complaint  Patient presents with  . Follow-up    after ABD/US,GI CLEARANCE, PFT with RA BLOOD GAS and VEIN MAPPING    HPI: 61 year old morbidly obese male with recently diagnosed severe three-vessel coronary disease and normal LV function. Patient had cardiac evaluation in preparation for possible gastric bypass surgery--the patient weighs 350 pounds.  After the initial consultation the patient has undergone bilateral lower extremity vein mapping which shows adequate vein ,3-4 millimeter diameter,  as conduit for coronary bypass grafting  Patient has undergone PFTs which are adequate showing diffusion capacity  50 and diffusion, FVC 3.5 and FEV1 2.9 [65% predicted]  The patient has fatty liver with some evidence of cirrhosis. He was seen by his medicine care provider and repeat ultrasound of the liver showed no evidence of his significant cirrhosis. Previous LFTs have been normal. I checked a ammonia level which was mildly elevated at 55.   We will proceed with multivessel CABG later in the month, as surgery scheduled for June 26. The patient is planning on pursuing gastric bypass surgery after he he recovers some heart surgery.   I discussed indications benefits of surgery with the patient. He understands due to his excessive weight, 350 pounds, that he is at increased risk for pulmonary problems, wound infections, organ failure, and death. He agrees to proceed.  Past Medical History:  Diagnosis Date  . Anxiety   . Arthritis   . Coronary atherosclerosis of native coronary artery    BMS LAD and PTCA RCA 2000,BMS RCA/PLA 2001  . Diabetes mellitus   . Gastric ulcer   . GERD (gastroesophageal reflux disease)   . HTN (hypertension)   . Hyperlipidemia   . Low-grade NHL (non-Hodgkin's lymphoma)   . Lupus anticoagulant positive   . MI (myocardial infarction) ([place]) 2001  . Type 2 diabetes mellitus ([place])      Past Surgical History:  Procedure Laterality Date  . Axillary abscess     left incision and drainage left  . BACK SURGERY     multiple  . BIOPSY  08/15/2014   Procedure: BIOPSY;  Surgeon: Danie Binder, MD;  Location: [facility];  Service: Endoscopy;;  Gastric  . BIOPSY  01/19/2015   Procedure: BIOPSY (GASTRIC ULCER);  Surgeon: Danie Binder, MD;  Location: [facility];  Service: Endoscopy;;  . cardiac stents     x5 stents  . COLONOSCOPY  June 2008   Dr. Dellis Filbert Medoff: Mild left colonic diverticulosis  . Dental extractions    . ESOPHAGOGASTRODUODENOSCOPY  June 2008   Dr. Dellis Filbert Medoff: Gastric ulcer, antral, biopsy with reactive changes associated glandular atrophy, no H pylori. No features of lymphoma. Likely NSAID related  . ESOPHAGOGASTRODUODENOSCOPY  August 2008   Dr. Dellis Filbert Medoff: Complete healing of gastric ulcers.  . ESOPHAGOGASTRODUODENOSCOPY (EGD) WITH PROPOFOL N/A 08/15/2014   SLF: 1. Abnormal pain due to ulcers & gastriitis   . ESOPHAGOGASTRODUODENOSCOPY (EGD) WITH PROPOFOL N/A 01/19/2015   SLF: 1. Persistant ulcer with firm base 2. single gastric polyp found in the gastric antrum. Ulcer base   . EUS N/A 03/21/2015   Thickened gastric wall in pre-pyloric antrum, no obvious intramural mass, fatty pancreas, no obvious pancreatic mass.   . Inguinal lymph node biopsy     right inguinal lymph node biopsy  . INTRAVASCULAR PRESSURE WIRE/FFR STUDY N/A 09/26/2016   Procedure: Intravascular Pressure Wire/FFR Study;  Surgeon: Nelva Bush, MD;  Location: [facility];  Service: Cardiovascular;  Laterality: N/A;  . LEFT HEART CATH AND CORONARY ANGIOGRAPHY N/A 09/26/2016   Procedure: Left Heart Cath and Coronary Angiography;  Surgeon: Nelva Bush, MD;  Location: [facility];  Service: Cardiovascular;  Laterality: N/A;  . NECK SURGERY    . POLYPECTOMY  01/19/2015   Procedure: POLYPECTOMY (GASTRIC);  Surgeon: Danie Binder, MD;  Location: [facility];  Service: Endoscopy;;  .  SHOULDER ARTHROSCOPY Left     Family History  Problem Relation Age of Onset  . Pancreatic cancer Maternal Grandmother   . Colon cancer Neg Hx     Social History Social History  Substance Use Topics  . Smoking status: Former Smoker    Packs/day: 1.00    Years: 23.00    Types: Cigarettes    Start date: 10/30/1970    Quit date: 10/29/1993  . Smokeless tobacco: Former Systems developer    Types: Snuff, Chew  . Alcohol use No    Current Outpatient Prescriptions  Medication Sig Dispense Refill  . aspirin EC 81 MG tablet Take 81 mg by mouth daily.    . cholecalciferol (VITAMIN D) 1000 units tablet Take 1,000 Units by mouth daily.    Marland Kitchen FLUoxetine (PROZAC) 20 MG capsule Take 20 mg by mouth daily.    Marland Kitchen gabapentin (NEURONTIN) 300 MG capsule Take 300 mg by mouth at bedtime.    . insulin aspart (NOVOLOG FLEXPEN) 100 UNIT/ML FlexPen Inject 18-24 Units into the skin 3 (three) times daily with meals. 30 mL 2  . Insulin Glargine (BASAGLAR KWIKPEN) 100 UNIT/ML SOPN INJECT 80 UNITS INTO THE SKIN DAILY AT 10 PM 30 mL 2  . isosorbide mononitrate (IMDUR) 30 MG 24 hr tablet TAKE 1 TABLET BY MOUTH EVERY DAY 30 tablet 6  . losartan (COZAAR) 100 MG tablet Take 100 mg by mouth daily.    . metFORMIN (GLUCOPHAGE) 500 MG tablet Take 1 tablet (500 mg total) by mouth 2 (two) times daily with a meal. 60 tablet 3  . metoprolol (LOPRESSOR) 50 MG tablet Take 25 mg by mouth 2 (two) times daily.      . nitroGLYCERIN (NITROSTAT) 0.4 MG SL tablet Place 1 tablet (0.4 mg total) under the tongue every 5 (five) minutes as needed. 25 tablet 3  . ONETOUCH VERIO test strip USE FOUR TIMES DAILY AS DIRECTED 450 each 0  . oxyCODONE (ROXICODONE) 15 MG immediate release tablet Take 15 mg by mouth every 4 (four) hours as needed for pain.    . pantoprazole (PROTONIX) 40 MG tablet TAKE 1 TABLET BY MOUTH BEFORE BREAKFAST 30 tablet 11  . simvastatin (ZOCOR) 10 MG tablet TAKE 1 TABLET BY MOUTH EVERY NIGHT AT BEDTIME 30 tablet 2  . spironolactone  (ALDACTONE) 50 MG tablet Take 1 tablet (50 mg total) by mouth daily. 90 tablet 3  . VICTOZA 18 MG/3ML SOPN ADMINISTER 1.8 MG UNDER THE SKIN EVERY DAY 9 mL 2  . zolpidem (AMBIEN) 10 MG tablet Take 10 mg by mouth at bedtime as needed for sleep.     No current facility-administered medications for this visit.     No Known Allergies  Review of Systems  No fevers, weight stable   no syncope or falls No chest pain or shortness of breath No abdominal pain or jaundice No leg pain or swelling No bleeding problems No symptoms of sinus congestion or chest congestion  BP 131/71 (BP Location: Right Arm, Patient Position: Sitting, Cuff Size: Large)   Pulse 76  Resp 18   Ht 6\' 3"  (1.905 m)   Wt (!) 341 lb (154.7 kg)   SpO2 95% Comment: ON RA  BMI 42.62 kg/m  Physical Exam       Exam    General- alert and comfortable   Lungs- clear without rales, wheezes   Cor- regular rate and rhythm, no murmur , gallop   Abdomen- soft, non-tender   Extremities - warm, non-tender, minimal edema   Neuro- oriented, appropriate, no focal weakness  Diagnostic Tests: Vein mapping, PFTs, hepatic ultrasound results reviewed with patient  Impression:  severe three-vessel coronary disease   diabetes mellitus Morbid obesity  Plan:  CABG scheduled for June 26 with bypass grafts planned to LAD, diagonal, circumflex marginal, distal RCA.   Len Childs, MD [facility] [phone]

## 2016-10-24 ENCOUNTER — Other Ambulatory Visit (HOSPITAL_COMMUNITY): Payer: Self-pay

## 2016-10-24 ENCOUNTER — Ambulatory Visit (HOSPITAL_COMMUNITY): Payer: BC Managed Care – PPO

## 2016-11-05 ENCOUNTER — Other Ambulatory Visit: Payer: Self-pay | Admitting: "Endocrinology

## 2016-11-07 ENCOUNTER — Encounter (HOSPITAL_COMMUNITY): Payer: Self-pay

## 2016-11-07 ENCOUNTER — Encounter (HOSPITAL_COMMUNITY)
Admission: RE | Admit: 2016-11-07 | Discharge: 2016-11-07 | Disposition: A | Discharge status: Home / Self Care | Payer: BC Managed Care – PPO | Source: Ambulatory Visit | Attending: Cardiothoracic Surgery | Admitting: Cardiothoracic Surgery

## 2016-11-07 ENCOUNTER — Ambulatory Visit (HOSPITAL_COMMUNITY)
Admission: RE | Admit: 2016-11-07 | Discharge: 2016-11-07 | Disposition: A | Discharge status: Home / Self Care | Payer: BC Managed Care – PPO | Source: Ambulatory Visit | Attending: Cardiothoracic Surgery | Admitting: Cardiothoracic Surgery

## 2016-11-07 ENCOUNTER — Other Ambulatory Visit (HOSPITAL_COMMUNITY): Payer: Self-pay | Admitting: *Deleted

## 2016-11-07 DIAGNOSIS — I2510 Atherosclerotic heart disease of native coronary artery without angina pectoris: Secondary | ICD-10-CM | POA: Insufficient documentation

## 2016-11-07 DIAGNOSIS — I70203 Unspecified atherosclerosis of native arteries of extremities, bilateral legs: Secondary | ICD-10-CM | POA: Insufficient documentation

## 2016-11-07 HISTORY — DX: Umbilical hernia without obstruction or gangrene: K42.9

## 2016-11-07 HISTORY — DX: Diverticulitis of intestine, part unspecified, without perforation or abscess without bleeding: K57.92

## 2016-11-07 HISTORY — DX: Fatty (change of) liver, not elsewhere classified: K76.0

## 2016-11-07 LAB — URINALYSIS, ROUTINE W REFLEX MICROSCOPIC
Bacteria, UA: NONE SEEN
Bilirubin Urine: NEGATIVE
Glucose, UA: 50 mg/dL — AB
Hgb urine dipstick: NEGATIVE
Ketones, ur: NEGATIVE mg/dL
Leukocytes, UA: NEGATIVE
Nitrite: NEGATIVE
Protein, ur: 30 mg/dL — AB
Specific Gravity, Urine: 1.024 (ref 1.005–1.030)
pH: 6 (ref 5.0–8.0)

## 2016-11-07 LAB — CBC
HCT: 40.8 % (ref 39.0–52.0)
Hemoglobin: 14 g/dL (ref 13.0–17.0)
MCH: 32.7 pg (ref 26.0–34.0)
MCHC: 34.3 g/dL (ref 30.0–36.0)
MCV: 95.3 fL (ref 78.0–100.0)
Platelets: 137 10*3/uL — ABNORMAL LOW (ref 150–400)
RBC: 4.28 MIL/uL (ref 4.22–5.81)
RDW: 12.4 % (ref 11.5–15.5)
WBC: 9.1 10*3/uL (ref 4.0–10.5)

## 2016-11-07 LAB — TYPE AND SCREEN
ABO/RH(D): A POS
Antibody Screen: NEGATIVE

## 2016-11-07 LAB — VAS US DOPPLER PRE CABG
LEFT ECA DIAS: -12 cm/s
LEFT VERTEBRAL DIAS: 8 cm/s
Left CCA dist dias: -17 cm/s
Left CCA dist sys: -52 cm/s
Left CCA prox dias: 20 cm/s
Left CCA prox sys: 57 cm/s
Left ICA dist dias: -27 cm/s
Left ICA dist sys: -67 cm/s
Left ICA prox dias: -11 cm/s
Left ICA prox sys: -44 cm/s
RIGHT ECA DIAS: -9 cm/s
RIGHT VERTEBRAL DIAS: 11 cm/s
Right CCA prox dias: 10 cm/s
Right CCA prox sys: 46 cm/s
Right cca dist sys: -46 cm/s

## 2016-11-07 LAB — BLOOD GAS, ARTERIAL
Acid-Base Excess: 0.6 mmol/L (ref 0.0–2.0)
Bicarbonate: 24.4 mmol/L (ref 20.0–28.0)
Drawn by: 449841
O2 Saturation: 95.7 %
Patient temperature: 98.6
pCO2 arterial: 37.3 mmHg (ref 32.0–48.0)
pH, Arterial: 7.432 (ref 7.350–7.450)
pO2, Arterial: 82 mmHg — ABNORMAL LOW (ref 83.0–108.0)

## 2016-11-07 LAB — COMPREHENSIVE METABOLIC PANEL
ALT: 23 U/L (ref 17–63)
AST: 32 U/L (ref 15–41)
Albumin: 3.7 g/dL (ref 3.5–5.0)
Alkaline Phosphatase: 55 U/L (ref 38–126)
Anion gap: 9 (ref 5–15)
BUN: 18 mg/dL (ref 6–20)
CO2: 23 mmol/L (ref 22–32)
Calcium: 9.2 mg/dL (ref 8.9–10.3)
Chloride: 103 mmol/L (ref 101–111)
Creatinine, Ser: 1.23 mg/dL (ref 0.61–1.24)
GFR calc Af Amer: >60 mL/min (ref >60)
GFR calc non Af Amer: >60 mL/min (ref >60)
Glucose, Bld: 130 mg/dL — ABNORMAL HIGH (ref 65–99)
Potassium: 4.2 mmol/L (ref 3.5–5.1)
Sodium: 135 mmol/L (ref 135–145)
Total Bilirubin: 0.7 mg/dL (ref 0.3–1.2)
Total Protein: 7.3 g/dL (ref 6.5–8.1)

## 2016-11-07 LAB — APTT: aPTT: 44 seconds — ABNORMAL HIGH (ref 24–36)

## 2016-11-07 LAB — SURGICAL PCR SCREEN
MRSA, PCR: NEGATIVE
Staphylococcus aureus: NEGATIVE

## 2016-11-07 LAB — GLUCOSE, CAPILLARY: Glucose-Capillary: 124 mg/dL — ABNORMAL HIGH (ref 65–99)

## 2016-11-07 LAB — PROTIME-INR
INR: 1.05
Prothrombin Time: 13.7 seconds (ref 11.4–15.2)

## 2016-11-07 LAB — ABO/RH: ABO/RH(D): A POS

## 2016-11-07 NOTE — Progress Notes (Signed)
Pre-op Cardiac Surgery  Carotid Findings:  Findings suggest 1-39% internal carotid artery stenosis bilaterally. Vertebral arteries are patent with antegrade flow.  Upper Extremity Right Left  Brachial Pressures 119-Triphasic  117-Triphasic  Radial Waveforms Triphasic Triphasic  Ulnar Waveforms Triphasic Triphasic  Palmar Arch (Allen's Test) Signal is unaffected with radial compression, obliterates with ulnar comrpession. Within normal limits.   Lower  Extremity Right Left  Dorsalis Pedis Non-compressible- triphasic 183-Triphasic  Anterior Tibial    Posterior Tibial 64-Dampened monophasic 58-Dampened monophasic  Ankle/Brachial Indices- dorsalis pedis 2.14 1.54  Ankle/Brachial Indices- posterior tibial 0.54 0.49    Findings:   The right dorsalis pedis artery ABI is noncompressible, and the left dorsalis pedis artery ABI is elevated. These findings are suggestive of medial arterial calficiation of bilateral dorsalis pedis arteries.  The right posterior tibial artery ABI is suggestive of moderate, borderline severe arterial insufficiency. The left posterior tibial ABI is suggestive of severe, borderline moderate, arterial insufficiency.  11/07/2016 2:11 PM Maudry Mayhew, BS, RVT, RDCS, RDMS

## 2016-11-07 NOTE — Progress Notes (Signed)
   11/07/16 1420  OBSTRUCTIVE SLEEP APNEA  Have you ever been diagnosed with sleep apnea through a sleep study? No  Do you snore loudly (loud enough to be heard through closed doors)?  1  Do you often feel tired, fatigued, or sleepy during the daytime (such as falling asleep during driving or talking to someone)? 0  Has anyone observed you stop breathing during your sleep? 1  Do you have, or are you being treated for high blood pressure? 1  BMI more than 35 kg/m2? 1  Age > 50 (1-yes) 1  Neck circumference greater than:Male 16 inches or larger, Male 17inches or larger? 1  Male Gender (Yes=1) 1  Obstructive Sleep Apnea Score 7  Score 5 or greater  Results sent to PCP

## 2016-11-07 NOTE — Progress Notes (Signed)
Pt denies chest pain or sob. Hx of CAD. Pt is diabetic. Last A1C was 7.1 on 08/11/16. Pt states his fasting blood sugar is usually between 127-200.

## 2016-11-07 NOTE — Pre-Procedure Instructions (Signed)
Kelly [name]  11/07/2016    Your procedure is scheduled on Tuesday, November 11, 2016 at 7:30 AM.   Report to [facility] Entrance "A" Admitting Office at 5:30 AM.   Call this number if you have problems the morning of surgery: [phone]   Questions prior to day of surgery, please call [phone] between 8 & 4 PM.   Remember:  Do not eat food or drink liquids after midnight Monday, 11/10/16.  Take these medicines the morning of surgery with A SIP OF WATER: Aspirin, Fluoxetine (Prozac), Isosorbide Mononitrate (Imdur), Metoprolol (Lopressor), Pantoprazole (Protonix), Oxycodone - if needed.   How to Manage Your Diabetes Before Surgery   Why is it important to control my blood sugar before and after surgery?   Improving blood sugar levels before and after surgery helps healing and can limit problems.  A way of improving blood sugar control is eating a healthy diet by:  - Eating less sugar and carbohydrates  - Increasing activity/exercise  - Talk with your doctor about reaching your blood sugar goals  High blood sugars (greater than 180 mg/dL) can raise your risk of infections and slow down your recovery so you will need to focus on controlling your diabetes during the weeks before surgery.  Make sure that the doctor who takes care of your diabetes knows about your planned surgery including the date and location.  How do I manage my blood sugars before surgery?   Check your blood sugar at least 4 times a day, 2 days before surgery to make sure that they are not too high or low.  Check your blood sugar the morning of your surgery when you wake up and every 2 hours until you get to the Short-Stay unit.  Treat a low blood sugar (less than 70 mg/dL) with 1/2 cup of clear juice (cranberry or apple), 4 glucose tablets, OR glucose gel.  Recheck blood sugar in 15 minutes after treatment (to make sure it is greater than 70 mg/dL).  If blood sugar is not greater than 70  mg/dL on re-check, call [phone] for further instructions.   Report your blood sugar to the Short-Stay nurse when you get to Short-Stay.  References:  [facility], 2007 "How to Manage your Diabetes Before and After Surgery".  What do I do about my diabetes medications?   Do not take oral diabetes medicines (pills) the morning of surgery.  THE NIGHT BEFORE SURGERY, take 40 units of Basaglar Insulin.   Do not take other diabetes injectables the day of surgery including Byetta, Victoza, Bydureon, and Trulicity.    If your CBG is greater than 220 mg/dL, you may take 1/2 of your sliding scale (correction) dose of insulin.  .  Do not wear jewelry.  Do not wear lotions, powders, cologne or deodorant.  Men may shave face and neck.  Do not bring valuables to the hospital.  [facility] is not responsible for any belongings or valuables.  Contacts, dentures or bridgework may not be worn into surgery.  Leave your suitcase in the car.  After surgery it may be brought to your room.  For patients admitted to the hospital, discharge time will be determined by your treatment team.  Special instructions:  [name] - Preparing for Surgery  Before surgery, you can play an important role.  Because skin is not sterile, your skin needs to be as free of germs as possible.  You can reduce the number of  germs on you skin by washing with CHG (chlorahexidine gluconate) soap before surgery.  CHG is an antiseptic cleaner which kills germs and bonds with the skin to continue killing germs even after washing.  Please DO NOT use if you have an allergy to CHG or antibacterial soaps.  If your skin becomes reddened/irritated stop using the CHG and inform your nurse when you arrive at Short Stay.  Do not shave (including legs and underarms) for at least 48 hours prior to the first CHG shower.  You may shave your face.  Please follow these instructions carefully:   1.  Shower  with CHG Soap the night before surgery and the                    morning of Surgery.  2.  If you choose to wash your hair, wash your hair first as usual with your       normal shampoo.  3.  After you shampoo, rinse your hair and body thoroughly to remove the shampoo.  4.  Use CHG as you would any other liquid soap.  You can apply chg directly       to the skin and wash gently with scrungie or a clean washcloth.  5.  Apply the CHG Soap to your body ONLY FROM THE NECK DOWN.        Do not use on open wounds or open sores.  Avoid contact with your eyes, ears, mouth and genitals (private parts).  Wash genitals (private parts) with your normal soap.  6.  Wash thoroughly, paying special attention to the area where your surgery        will be performed.  7.  Thoroughly rinse your body with warm water from the neck down.  8.  DO NOT shower/wash with your normal soap after using and rinsing off       the CHG Soap.  9.  Pat yourself dry with a clean towel.            10.  Wear clean pajamas.            11.  Place clean sheets on your bed the night of your first shower and do not        sleep with pets.  Day of Surgery  Do not apply any lotions/deodorants the morning of surgery.  Please wear clean clothes to the hospital.   Please read over the fact sheets that you were given.

## 2016-11-08 LAB — HEMOGLOBIN A1C
Hgb A1c MFr Bld: 8.4 % — ABNORMAL HIGH (ref 4.8–5.6)
Mean Plasma Glucose: 194 mg/dL

## 2016-11-10 ENCOUNTER — Encounter (HOSPITAL_COMMUNITY): Payer: Self-pay | Admitting: Anesthesiology

## 2016-11-10 MED ORDER — TRANEXAMIC ACID 1000 MG/10ML IV SOLN
1.5000 mg/kg/h | INTRAVENOUS | Status: AC
  Administered 2016-11-11: 1.5 mg/kg/h via INTRAVENOUS
  Filled 2016-11-10 (×2): qty 25

## 2016-11-10 MED ORDER — NITROGLYCERIN IN D5W 200-5 MCG/ML-% IV SOLN
2.0000 ug/min | INTRAVENOUS | Status: AC
  Administered 2016-11-11: 5 ug/min via INTRAVENOUS
  Filled 2016-11-10: qty 250

## 2016-11-10 MED ORDER — SODIUM CHLORIDE 0.9 % IV SOLN
30.0000 ug/min | INTRAVENOUS | Status: DC
  Filled 2016-11-10: qty 2

## 2016-11-10 MED ORDER — DEXTROSE 5 % IV SOLN
750.0000 mg | INTRAVENOUS | Status: DC
  Filled 2016-11-10: qty 750

## 2016-11-10 MED ORDER — MAGNESIUM SULFATE 50 % IJ SOLN
40.0000 meq | INTRAMUSCULAR | Status: DC
  Filled 2016-11-10: qty 10

## 2016-11-10 MED ORDER — SODIUM CHLORIDE 0.9 % IV SOLN
INTRAVENOUS | Status: DC
  Filled 2016-11-10: qty 30

## 2016-11-10 MED ORDER — INSULIN REGULAR HUMAN 100 UNIT/ML IJ SOLN
INTRAMUSCULAR | Status: DC
  Filled 2016-11-10: qty 1

## 2016-11-10 MED ORDER — POTASSIUM CHLORIDE 2 MEQ/ML IV SOLN
80.0000 meq | INTRAVENOUS | Status: DC
  Filled 2016-11-10: qty 40

## 2016-11-10 MED ORDER — TRANEXAMIC ACID (OHS) PUMP PRIME SOLUTION
2.0000 mg/kg | INTRAVENOUS | Status: DC
Start: 2016-11-11 — End: 2016-11-11
  Filled 2016-11-10: qty 3.1

## 2016-11-10 MED ORDER — EPINEPHRINE PF 1 MG/ML IJ SOLN
0.0000 ug/min | INTRAVENOUS | Status: DC
  Filled 2016-11-10: qty 4

## 2016-11-10 MED ORDER — TRANEXAMIC ACID (OHS) BOLUS VIA INFUSION
15.0000 mg/kg | INTRAVENOUS | Status: AC
  Administered 2016-11-11: 2328 mg via INTRAVENOUS
  Filled 2016-11-10: qty 2328

## 2016-11-10 MED ORDER — DEXTROSE 5 % IV SOLN
1.5000 g | INTRAVENOUS | Status: AC
  Administered 2016-11-11: .75 g via INTRAVENOUS
  Administered 2016-11-11: 1.5 g via INTRAVENOUS
  Filled 2016-11-10: qty 1.5

## 2016-11-10 MED ORDER — METOPROLOL TARTRATE 12.5 MG HALF TABLET: 12.5000 mg | ORAL_TABLET | ORAL | Status: DC

## 2016-11-10 MED ORDER — VANCOMYCIN HCL 10 G IV SOLR
1500.0000 mg | INTRAVENOUS | Status: DC
  Filled 2016-11-10: qty 1500

## 2016-11-10 MED ORDER — DEXMEDETOMIDINE HCL IN NACL 400 MCG/100ML IV SOLN
0.1000 ug/kg/h | INTRAVENOUS | Status: DC
  Filled 2016-11-10 (×2): qty 100

## 2016-11-10 MED ORDER — DOPAMINE-DEXTROSE 3.2-5 MG/ML-% IV SOLN
0.0000 ug/kg/min | INTRAVENOUS | Status: DC
  Filled 2016-11-10: qty 250

## 2016-11-10 MED ORDER — CHLORHEXIDINE GLUCONATE 0.12 % MT SOLN
15.0000 mL | Freq: Once | OROMUCOSAL | Status: DC
  Filled 2016-11-10: qty 15

## 2016-11-10 MED ORDER — PLASMA-LYTE 148 IV SOLN
INTRAVENOUS | Status: AC
  Administered 2016-11-11: 500 mL
  Filled 2016-11-10: qty 2.5

## 2016-11-10 NOTE — Anesthesia Preprocedure Evaluation (Addendum)
Anesthesia Evaluation  Patient identified by MRN, date of birth, ID band Patient awake    Reviewed: Allergy & Precautions, NPO status , Patient's Chart, lab work & pertinent test results  Airway Mallampati: II  TM Distance: >3 FB Neck ROM: Full    Dental  (+) Edentulous Upper, Edentulous Lower   Pulmonary neg pulmonary ROS, former smoker,    breath sounds clear to auscultation       Cardiovascular hypertension, + CAD, + Past MI, + Cardiac Stents and + Peripheral Vascular Disease  negative cardio ROS   Rhythm:Regular Rate:Normal     Neuro/Psych Anxiety negative neurological ROS     GI/Hepatic Neg liver ROS, PUD, GERD  ,  Endo/Other  negative endocrine ROSdiabetes, Type 2, Oral Hypoglycemic Agents  Renal/GU negative Renal ROS  negative genitourinary   Musculoskeletal  (+) Arthritis , Osteoarthritis,    Abdominal   Peds negative pediatric ROS (+)  Hematology negative hematology ROS (+)   Anesthesia Other Findings Day of surgery medications reviewed with the patient.  - HLD - Lupus  Reproductive/Obstetrics negative OB ROS                            Lab Results  Component Value Date   WBC 9.1 11/07/2016   HGB 14.0 11/07/2016   HCT 40.8 11/07/2016   MCV 95.3 11/07/2016   PLT 137 (L) 11/07/2016   Lab Results  Component Value Date   CREATININE 1.23 11/07/2016   BUN 18 11/07/2016   NA 135 11/07/2016   K 4.2 11/07/2016   CL 103 11/07/2016   CO2 23 11/07/2016   Lab Results  Component Value Date   INR 1.05 11/07/2016   INR 1.1 10/06/2016   INR 1.04 09/24/2016   EKG: NSR w/ 1st degree AV block, RBBB  Echo: - Left ventricle: The cavity size was normal. Wall thickness was   increased in a pattern of moderate LVH. Systolic function was   normal. The estimated ejection fraction was in the range of 55%   to 60%. Wall motion was normal; there were no regional wall   motion  abnormalities. Left ventricular diastolic function   parameters were normal. - Aortic valve: Trileaflet; mildly thickened, mild to moderately   calcified leaflets. There was mild to moderate stenosis. Peak   velocity (S): 232 cm/s. Mean gradient (S): 11 mm Hg. Valve area   (VTI): 1.42 cm^2. Valve area (Vmax): 1.33 cm^2. Valve area   (Vmean): 1.42 cm^2. - Right ventricle: The cavity size was mildly dilated. Wall   thickness was normal.  Anesthesia Physical Anesthesia Plan  ASA: IV  Anesthesia Plan: General   Post-op Pain Management:    Induction: Intravenous  PONV Risk Score and Plan:   Airway Management Planned: Oral ETT  Additional Equipment: Arterial line, CVP, PA Cath, TEE and Ultrasound Guidance Line Placement  Intra-op Plan:   Post-operative Plan: Post-operative intubation/ventilation  Informed Consent: I have reviewed the patients History and Physical, chart, labs and discussed the procedure including the risks, benefits and alternatives for the proposed anesthesia with the patient or authorized representative who has indicated his/her understanding and acceptance.     Plan Discussed with: CRNA and Anesthesiologist  Anesthesia Plan Comments:       Anesthesia Quick Evaluation

## 2016-11-11 ENCOUNTER — Inpatient Hospital Stay (HOSPITAL_COMMUNITY): Payer: BC Managed Care – PPO

## 2016-11-11 ENCOUNTER — Inpatient Hospital Stay (HOSPITAL_COMMUNITY)
Admission: RE | Admit: 2016-11-11 | Discharge: 2016-11-21 | DRG: 236 | Disposition: A | Discharge status: Home Health | Payer: BC Managed Care – PPO | Source: Ambulatory Visit | Attending: Cardiothoracic Surgery | Admitting: Cardiothoracic Surgery

## 2016-11-11 ENCOUNTER — Inpatient Hospital Stay (HOSPITAL_COMMUNITY): Payer: BC Managed Care – PPO | Admitting: Certified Registered Nurse Anesthetist

## 2016-11-11 ENCOUNTER — Encounter (HOSPITAL_COMMUNITY)
Admission: RE | Disposition: A | Discharge status: Home Health | Payer: Self-pay | Source: Ambulatory Visit | Attending: Cardiothoracic Surgery

## 2016-11-11 ENCOUNTER — Encounter (HOSPITAL_COMMUNITY): Payer: Self-pay | Admitting: *Deleted

## 2016-11-11 DIAGNOSIS — G4733 Obstructive sleep apnea (adult) (pediatric): Secondary | ICD-10-CM | POA: Diagnosis present

## 2016-11-11 DIAGNOSIS — K760 Fatty (change of) liver, not elsewhere classified: Secondary | ICD-10-CM | POA: Diagnosis present

## 2016-11-11 DIAGNOSIS — C8590 Non-Hodgkin lymphoma, unspecified, unspecified site: Secondary | ICD-10-CM | POA: Diagnosis present

## 2016-11-11 DIAGNOSIS — Z794 Long term (current) use of insulin: Secondary | ICD-10-CM

## 2016-11-11 DIAGNOSIS — Z79899 Other long term (current) drug therapy: Secondary | ICD-10-CM

## 2016-11-11 DIAGNOSIS — Z7984 Long term (current) use of oral hypoglycemic drugs: Secondary | ICD-10-CM

## 2016-11-11 DIAGNOSIS — K259 Gastric ulcer, unspecified as acute or chronic, without hemorrhage or perforation: Secondary | ICD-10-CM | POA: Diagnosis present

## 2016-11-11 DIAGNOSIS — D62 Acute posthemorrhagic anemia: Secondary | ICD-10-CM | POA: Diagnosis not present

## 2016-11-11 DIAGNOSIS — J811 Chronic pulmonary edema: Secondary | ICD-10-CM | POA: Diagnosis present

## 2016-11-11 DIAGNOSIS — K7460 Unspecified cirrhosis of liver: Secondary | ICD-10-CM | POA: Diagnosis present

## 2016-11-11 DIAGNOSIS — Z951 Presence of aortocoronary bypass graft: Secondary | ICD-10-CM

## 2016-11-11 DIAGNOSIS — Z6841 Body Mass Index (BMI) 40.0 and over, adult: Secondary | ICD-10-CM | POA: Diagnosis not present

## 2016-11-11 DIAGNOSIS — M1990 Unspecified osteoarthritis, unspecified site: Secondary | ICD-10-CM | POA: Diagnosis present

## 2016-11-11 DIAGNOSIS — F419 Anxiety disorder, unspecified: Secondary | ICD-10-CM | POA: Diagnosis not present

## 2016-11-11 DIAGNOSIS — R1310 Dysphagia, unspecified: Secondary | ICD-10-CM | POA: Diagnosis present

## 2016-11-11 DIAGNOSIS — K219 Gastro-esophageal reflux disease without esophagitis: Secondary | ICD-10-CM | POA: Diagnosis present

## 2016-11-11 DIAGNOSIS — D696 Thrombocytopenia, unspecified: Secondary | ICD-10-CM | POA: Diagnosis present

## 2016-11-11 DIAGNOSIS — T8130XA Disruption of wound, unspecified, initial encounter: Secondary | ICD-10-CM | POA: Diagnosis not present

## 2016-11-11 DIAGNOSIS — I252 Old myocardial infarction: Secondary | ICD-10-CM | POA: Diagnosis not present

## 2016-11-11 DIAGNOSIS — D6862 Lupus anticoagulant syndrome: Secondary | ICD-10-CM | POA: Diagnosis present

## 2016-11-11 DIAGNOSIS — I2510 Atherosclerotic heart disease of native coronary artery without angina pectoris: Secondary | ICD-10-CM

## 2016-11-11 DIAGNOSIS — I4891 Unspecified atrial fibrillation: Secondary | ICD-10-CM | POA: Diagnosis not present

## 2016-11-11 DIAGNOSIS — E785 Hyperlipidemia, unspecified: Secondary | ICD-10-CM | POA: Diagnosis present

## 2016-11-11 DIAGNOSIS — I25110 Atherosclerotic heart disease of native coronary artery with unstable angina pectoris: Principal | ICD-10-CM | POA: Diagnosis present

## 2016-11-11 DIAGNOSIS — E8770 Fluid overload, unspecified: Secondary | ICD-10-CM | POA: Diagnosis not present

## 2016-11-11 DIAGNOSIS — E1151 Type 2 diabetes mellitus with diabetic peripheral angiopathy without gangrene: Secondary | ICD-10-CM | POA: Diagnosis present

## 2016-11-11 DIAGNOSIS — I10 Essential (primary) hypertension: Secondary | ICD-10-CM | POA: Diagnosis present

## 2016-11-11 DIAGNOSIS — Z87891 Personal history of nicotine dependence: Secondary | ICD-10-CM | POA: Diagnosis not present

## 2016-11-11 HISTORY — PX: CORONARY ARTERY BYPASS GRAFT: SHX141

## 2016-11-11 HISTORY — PX: TEE WITHOUT CARDIOVERSION: SHX5443

## 2016-11-11 LAB — CBC
HCT: 34.4 % — ABNORMAL LOW (ref 39.0–52.0)
HCT: 34.9 % — ABNORMAL LOW (ref 39.0–52.0)
Hemoglobin: 11.4 g/dL — ABNORMAL LOW (ref 13.0–17.0)
Hemoglobin: 11.4 g/dL — ABNORMAL LOW (ref 13.0–17.0)
MCH: 32 pg (ref 26.0–34.0)
MCH: 31.4 pg (ref 26.0–34.0)
MCHC: 33.1 g/dL (ref 30.0–36.0)
MCHC: 32.7 g/dL (ref 30.0–36.0)
MCV: 96.6 fL (ref 78.0–100.0)
MCV: 96.1 fL (ref 78.0–100.0)
Platelets: 96 10*3/uL — ABNORMAL LOW (ref 150–400)
Platelets: 115 10*3/uL — ABNORMAL LOW (ref 150–400)
RBC: 3.56 MIL/uL — ABNORMAL LOW (ref 4.22–5.81)
RBC: 3.63 MIL/uL — ABNORMAL LOW (ref 4.22–5.81)
RDW: 12.4 % (ref 11.5–15.5)
RDW: 12.4 % (ref 11.5–15.5)
WBC: 10.4 10*3/uL (ref 4.0–10.5)
WBC: 11.6 10*3/uL — ABNORMAL HIGH (ref 4.0–10.5)

## 2016-11-11 LAB — POCT I-STAT 3, ART BLOOD GAS (G3+)
Acid-Base Excess: 1 mmol/L (ref 0.0–2.0)
Acid-Base Excess: 4 mmol/L — ABNORMAL HIGH (ref 0.0–2.0)
Acid-Base Excess: 2 mmol/L (ref 0.0–2.0)
Acid-Base Excess: 1 mmol/L (ref 0.0–2.0)
Bicarbonate: 28.7 mmol/L — ABNORMAL HIGH (ref 20.0–28.0)
Bicarbonate: 29.6 mmol/L — ABNORMAL HIGH (ref 20.0–28.0)
Bicarbonate: 27.5 mmol/L (ref 20.0–28.0)
Bicarbonate: 27.2 mmol/L (ref 20.0–28.0)
Bicarbonate: 26.1 mmol/L (ref 20.0–28.0)
Bicarbonate: 25.5 mmol/L (ref 20.0–28.0)
O2 Saturation: 99 %
O2 Saturation: 100 %
O2 Saturation: 96 %
O2 Saturation: 88 %
O2 Saturation: 92 %
O2 Saturation: 95 %
Patient temperature: 36.3
Patient temperature: 36.7
Patient temperature: 36
TCO2: 30 mmol/L (ref 0–100)
TCO2: 31 mmol/L (ref 0–100)
TCO2: 29 mmol/L (ref 0–100)
TCO2: 29 mmol/L (ref 0–100)
TCO2: 27 mmol/L (ref 0–100)
TCO2: 27 mmol/L (ref 0–100)
pCO2 arterial: 58.2 mmHg — ABNORMAL HIGH (ref 32.0–48.0)
pCO2 arterial: 49.7 mmHg — ABNORMAL HIGH (ref 32.0–48.0)
pCO2 arterial: 45.7 mmHg (ref 32.0–48.0)
pCO2 arterial: 45.8 mmHg (ref 32.0–48.0)
pCO2 arterial: 46.2 mmHg (ref 32.0–48.0)
pCO2 arterial: 41.3 mmHg (ref 32.0–48.0)
pH, Arterial: 7.301 — ABNORMAL LOW (ref 7.350–7.450)
pH, Arterial: 7.382 (ref 7.350–7.450)
pH, Arterial: 7.387 (ref 7.350–7.450)
pH, Arterial: 7.379 (ref 7.350–7.450)
pH, Arterial: 7.358 (ref 7.350–7.450)
pH, Arterial: 7.394 (ref 7.350–7.450)
pO2, Arterial: 178 mmHg — ABNORMAL HIGH (ref 83.0–108.0)
pO2, Arterial: 420 mmHg — ABNORMAL HIGH (ref 83.0–108.0)
pO2, Arterial: 88 mmHg (ref 83.0–108.0)
pO2, Arterial: 54 mmHg — ABNORMAL LOW (ref 83.0–108.0)
pO2, Arterial: 65 mmHg — ABNORMAL LOW (ref 83.0–108.0)
pO2, Arterial: 75 mmHg — ABNORMAL LOW (ref 83.0–108.0)

## 2016-11-11 LAB — MAGNESIUM: Magnesium: 1.9 mg/dL (ref 1.7–2.4)

## 2016-11-11 LAB — POCT I-STAT, CHEM 8
BUN: 20 mg/dL (ref 6–20)
BUN: 18 mg/dL (ref 6–20)
BUN: 17 mg/dL (ref 6–20)
BUN: 18 mg/dL (ref 6–20)
BUN: 18 mg/dL (ref 6–20)
BUN: 18 mg/dL (ref 6–20)
BUN: 17 mg/dL (ref 6–20)
BUN: 18 mg/dL (ref 6–20)
Calcium, Ion: 1.27 mmol/L (ref 1.15–1.40)
Calcium, Ion: 1.23 mmol/L (ref 1.15–1.40)
Calcium, Ion: 1.12 mmol/L — ABNORMAL LOW (ref 1.15–1.40)
Calcium, Ion: 1.27 mmol/L (ref 1.15–1.40)
Calcium, Ion: 1.14 mmol/L — ABNORMAL LOW (ref 1.15–1.40)
Calcium, Ion: 1.12 mmol/L — ABNORMAL LOW (ref 1.15–1.40)
Calcium, Ion: 1.09 mmol/L — ABNORMAL LOW (ref 1.15–1.40)
Calcium, Ion: 1.24 mmol/L (ref 1.15–1.40)
Chloride: 100 mmol/L — ABNORMAL LOW (ref 101–111)
Chloride: 100 mmol/L — ABNORMAL LOW (ref 101–111)
Chloride: 100 mmol/L — ABNORMAL LOW (ref 101–111)
Chloride: 99 mmol/L — ABNORMAL LOW (ref 101–111)
Chloride: 101 mmol/L (ref 101–111)
Chloride: 99 mmol/L — ABNORMAL LOW (ref 101–111)
Chloride: 102 mmol/L (ref 101–111)
Chloride: 101 mmol/L (ref 101–111)
Creatinine, Ser: 1.1 mg/dL (ref 0.61–1.24)
Creatinine, Ser: 1.1 mg/dL (ref 0.61–1.24)
Creatinine, Ser: 0.9 mg/dL (ref 0.61–1.24)
Creatinine, Ser: 1 mg/dL (ref 0.61–1.24)
Creatinine, Ser: 0.9 mg/dL (ref 0.61–1.24)
Creatinine, Ser: 0.9 mg/dL (ref 0.61–1.24)
Creatinine, Ser: 1 mg/dL (ref 0.61–1.24)
Creatinine, Ser: 1 mg/dL (ref 0.61–1.24)
Glucose, Bld: 192 mg/dL — ABNORMAL HIGH (ref 65–99)
Glucose, Bld: 163 mg/dL — ABNORMAL HIGH (ref 65–99)
Glucose, Bld: 144 mg/dL — ABNORMAL HIGH (ref 65–99)
Glucose, Bld: 156 mg/dL — ABNORMAL HIGH (ref 65–99)
Glucose, Bld: 146 mg/dL — ABNORMAL HIGH (ref 65–99)
Glucose, Bld: 158 mg/dL — ABNORMAL HIGH (ref 65–99)
Glucose, Bld: 213 mg/dL — ABNORMAL HIGH (ref 65–99)
Glucose, Bld: 219 mg/dL — ABNORMAL HIGH (ref 65–99)
HCT: 35 % — ABNORMAL LOW (ref 39.0–52.0)
HCT: 32 % — ABNORMAL LOW (ref 39.0–52.0)
HCT: 29 % — ABNORMAL LOW (ref 39.0–52.0)
HCT: 33 % — ABNORMAL LOW (ref 39.0–52.0)
HCT: 30 % — ABNORMAL LOW (ref 39.0–52.0)
HCT: 30 % — ABNORMAL LOW (ref 39.0–52.0)
HCT: 32 % — ABNORMAL LOW (ref 39.0–52.0)
HCT: 33 % — ABNORMAL LOW (ref 39.0–52.0)
Hemoglobin: 11.9 g/dL — ABNORMAL LOW (ref 13.0–17.0)
Hemoglobin: 10.9 g/dL — ABNORMAL LOW (ref 13.0–17.0)
Hemoglobin: 11.2 g/dL — ABNORMAL LOW (ref 13.0–17.0)
Hemoglobin: 9.9 g/dL — ABNORMAL LOW (ref 13.0–17.0)
Hemoglobin: 10.2 g/dL — ABNORMAL LOW (ref 13.0–17.0)
Hemoglobin: 10.2 g/dL — ABNORMAL LOW (ref 13.0–17.0)
Hemoglobin: 10.9 g/dL — ABNORMAL LOW (ref 13.0–17.0)
Hemoglobin: 11.2 g/dL — ABNORMAL LOW (ref 13.0–17.0)
Potassium: 4.7 mmol/L (ref 3.5–5.1)
Potassium: 4.6 mmol/L (ref 3.5–5.1)
Potassium: 4.5 mmol/L (ref 3.5–5.1)
Potassium: 4.5 mmol/L (ref 3.5–5.1)
Potassium: 4.6 mmol/L (ref 3.5–5.1)
Potassium: 4.8 mmol/L (ref 3.5–5.1)
Potassium: 4.2 mmol/L (ref 3.5–5.1)
Potassium: 3.8 mmol/L (ref 3.5–5.1)
Sodium: 138 mmol/L (ref 135–145)
Sodium: 138 mmol/L (ref 135–145)
Sodium: 139 mmol/L (ref 135–145)
Sodium: 139 mmol/L (ref 135–145)
Sodium: 138 mmol/L (ref 135–145)
Sodium: 137 mmol/L (ref 135–145)
Sodium: 138 mmol/L (ref 135–145)
Sodium: 137 mmol/L (ref 135–145)
TCO2: 29 mmol/L (ref 0–100)
TCO2: 27 mmol/L (ref 0–100)
TCO2: 27 mmol/L (ref 0–100)
TCO2: 30 mmol/L (ref 0–100)
TCO2: 30 mmol/L (ref 0–100)
TCO2: 30 mmol/L (ref 0–100)
TCO2: 27 mmol/L (ref 0–100)
TCO2: 25 mmol/L (ref 0–100)

## 2016-11-11 LAB — GLUCOSE, CAPILLARY
Glucose-Capillary: 188 mg/dL — ABNORMAL HIGH (ref 65–99)
Glucose-Capillary: 186 mg/dL — ABNORMAL HIGH (ref 65–99)
Glucose-Capillary: 165 mg/dL — ABNORMAL HIGH (ref 65–99)
Glucose-Capillary: 135 mg/dL — ABNORMAL HIGH (ref 65–99)
Glucose-Capillary: 135 mg/dL — ABNORMAL HIGH (ref 65–99)
Glucose-Capillary: 135 mg/dL — ABNORMAL HIGH (ref 65–99)
Glucose-Capillary: 130 mg/dL — ABNORMAL HIGH (ref 65–99)
Glucose-Capillary: 176 mg/dL — ABNORMAL HIGH (ref 65–99)

## 2016-11-11 LAB — CREATININE, SERUM
Creatinine, Ser: 1.31 mg/dL — ABNORMAL HIGH (ref 0.61–1.24)
GFR calc Af Amer: >60 mL/min (ref >60)
GFR calc non Af Amer: 58 mL/min — ABNORMAL LOW (ref >60)

## 2016-11-11 LAB — HEMOGLOBIN AND HEMATOCRIT, BLOOD
HCT: 31.4 % — ABNORMAL LOW (ref 39.0–52.0)
Hemoglobin: 10.5 g/dL — ABNORMAL LOW (ref 13.0–17.0)

## 2016-11-11 LAB — APTT: APTT: 37 s — AB (ref 24–36)

## 2016-11-11 LAB — PROTIME-INR
INR: 1.26
Prothrombin Time: 15.9 seconds — ABNORMAL HIGH (ref 11.4–15.2)

## 2016-11-11 LAB — PLATELET COUNT: Platelets: 88 10*3/uL — ABNORMAL LOW (ref 150–400)

## 2016-11-11 SURGERY — CORONARY ARTERY BYPASS GRAFTING (CABG)
Anesthesia: General | Site: Chest

## 2016-11-11 MED ORDER — CHLORHEXIDINE GLUCONATE 0.12% ORAL RINSE (MEDLINE KIT)
15.0000 mL | Freq: Two times a day (BID) | OROMUCOSAL | Status: DC
  Administered 2016-11-11 – 2016-11-13 (×4): 15 mL via OROMUCOSAL

## 2016-11-11 MED ORDER — ASPIRIN EC 325 MG PO TBEC
325.0000 mg | DELAYED_RELEASE_TABLET | Freq: Every day | ORAL | Status: DC
  Administered 2016-11-12 – 2016-11-21 (×9): 325 mg via ORAL
  Filled 2016-11-11 (×10): qty 1

## 2016-11-11 MED ORDER — METOCLOPRAMIDE HCL 5 MG/ML IJ SOLN
10.0000 mg | Freq: Four times a day (QID) | INTRAMUSCULAR | Status: DC
  Administered 2016-11-11 – 2016-11-16 (×20): 10 mg via INTRAVENOUS
  Filled 2016-11-11 (×18): qty 2

## 2016-11-11 MED ORDER — LACTATED RINGERS IV SOLN: 500.0000 mL | Freq: Once | INTRAVENOUS | Status: DC | PRN

## 2016-11-11 MED ORDER — LACTATED RINGERS IV SOLN
INTRAVENOUS | Status: DC | PRN
  Administered 2016-11-11: 07:00:00 via INTRAVENOUS

## 2016-11-11 MED ORDER — FUROSEMIDE 10 MG/ML IJ SOLN
20.0000 mg | Freq: Once | INTRAMUSCULAR | Status: AC
  Administered 2016-11-11: 20 mg via INTRAVENOUS
  Filled 2016-11-11: qty 2

## 2016-11-11 MED ORDER — ACETAMINOPHEN 650 MG RE SUPP
650.0000 mg | Freq: Once | RECTAL | Status: AC
  Administered 2016-11-11: 650 mg via RECTAL

## 2016-11-11 MED ORDER — MORPHINE SULFATE (PF) 4 MG/ML IV SOLN
INTRAVENOUS | Status: AC
  Filled 2016-11-11: qty 1

## 2016-11-11 MED ORDER — MILRINONE LACTATE IN DEXTROSE 20-5 MG/100ML-% IV SOLN
0.1250 ug/kg/min | INTRAVENOUS | Status: DC
  Administered 2016-11-11 – 2016-11-16 (×14): 0.25 ug/kg/min via INTRAVENOUS
  Administered 2016-11-17: 0.125 ug/kg/min via INTRAVENOUS
  Administered 2016-11-17: 0.25 ug/kg/min via INTRAVENOUS
  Administered 2016-11-18: 0.125 ug/kg/min via INTRAVENOUS
  Filled 2016-11-11 (×18): qty 100

## 2016-11-11 MED ORDER — SODIUM CHLORIDE 0.9 % IV SOLN
INTRAVENOUS | Status: DC | PRN
  Administered 2016-11-11: 1500 mg via INTRAVENOUS

## 2016-11-11 MED ORDER — POTASSIUM CHLORIDE 10 MEQ/50ML IV SOLN
10.0000 meq | INTRAVENOUS | Status: AC
  Administered 2016-11-11 (×3): 10 meq via INTRAVENOUS

## 2016-11-11 MED ORDER — FENTANYL CITRATE (PF) 250 MCG/5ML IJ SOLN
INTRAMUSCULAR | Status: AC
  Filled 2016-11-11: qty 5

## 2016-11-11 MED ORDER — LACTATED RINGERS IV SOLN: INTRAVENOUS | Status: DC

## 2016-11-11 MED ORDER — PROPOFOL 10 MG/ML IV BOLUS
INTRAVENOUS | Status: DC | PRN
Start: 2016-11-11 — End: 2016-11-11
  Administered 2016-11-11: 150 mg via INTRAVENOUS
  Administered 2016-11-11: 50 mg via INTRAVENOUS

## 2016-11-11 MED ORDER — LEVALBUTEROL HCL 1.25 MG/0.5ML IN NEBU
1.2500 mg | INHALATION_SOLUTION | Freq: Four times a day (QID) | RESPIRATORY_TRACT | Status: DC
  Administered 2016-11-11 – 2016-11-18 (×22): 1.25 mg via RESPIRATORY_TRACT
  Filled 2016-11-11 (×25): qty 0.5

## 2016-11-11 MED ORDER — HEMOSTATIC AGENTS (NO CHARGE) OPTIME
TOPICAL | Status: DC | PRN
  Administered 2016-11-11 (×2): 1 via TOPICAL

## 2016-11-11 MED ORDER — MORPHINE SULFATE (PF) 2 MG/ML IV SOLN: 1.0000 mg | INTRAVENOUS | Status: DC | PRN

## 2016-11-11 MED ORDER — DEXMEDETOMIDINE HCL IN NACL 400 MCG/100ML IV SOLN
INTRAVENOUS | Status: DC | PRN
  Administered 2016-11-11: .2 ug/kg/h via INTRAVENOUS

## 2016-11-11 MED ORDER — TAMSULOSIN HCL 0.4 MG PO CAPS
0.4000 mg | ORAL_CAPSULE | Freq: Every evening | ORAL | Status: DC
  Administered 2016-11-12 – 2016-11-20 (×9): 0.4 mg via ORAL
  Filled 2016-11-11 (×10): qty 1

## 2016-11-11 MED ORDER — CHLORHEXIDINE GLUCONATE 4 % EX LIQD: 30.0000 mL | CUTANEOUS | Status: DC

## 2016-11-11 MED ORDER — FAMOTIDINE IN NACL 20-0.9 MG/50ML-% IV SOLN
20.0000 mg | Freq: Two times a day (BID) | INTRAVENOUS | Status: AC
  Administered 2016-11-11 (×2): 20 mg via INTRAVENOUS
  Filled 2016-11-11: qty 50

## 2016-11-11 MED ORDER — CALCIUM CHLORIDE 10 % IV SOLN
INTRAVENOUS | Status: AC
  Filled 2016-11-11: qty 20

## 2016-11-11 MED ORDER — SODIUM CHLORIDE 0.9 % IV SOLN
0.0000 ug/min | INTRAVENOUS | Status: DC
  Administered 2016-11-12: 70 ug/min via INTRAVENOUS
  Administered 2016-11-12: 15 ug/min via INTRAVENOUS
  Filled 2016-11-11 (×4): qty 2

## 2016-11-11 MED ORDER — PROPOFOL 10 MG/ML IV BOLUS
INTRAVENOUS | Status: AC
  Filled 2016-11-11: qty 40

## 2016-11-11 MED ORDER — ZOLPIDEM TARTRATE 5 MG PO TABS
10.0000 mg | ORAL_TABLET | Freq: Every evening | ORAL | Status: DC | PRN
  Administered 2016-11-12 – 2016-11-19 (×6): 10 mg via ORAL
  Filled 2016-11-11 (×6): qty 2

## 2016-11-11 MED ORDER — ONDANSETRON HCL 4 MG/2ML IJ SOLN
4.0000 mg | Freq: Four times a day (QID) | INTRAMUSCULAR | Status: DC | PRN
  Administered 2016-11-11: 4 mg via INTRAVENOUS
  Filled 2016-11-11: qty 2

## 2016-11-11 MED ORDER — NITROGLYCERIN IN D5W 200-5 MCG/ML-% IV SOLN: 0.0000 ug/min | INTRAVENOUS | Status: DC

## 2016-11-11 MED ORDER — ROCURONIUM BROMIDE 10 MG/ML (PF) SYRINGE
PREFILLED_SYRINGE | INTRAVENOUS | Status: AC
  Filled 2016-11-11: qty 5

## 2016-11-11 MED ORDER — ACETAMINOPHEN 500 MG PO TABS
1000.0000 mg | ORAL_TABLET | Freq: Four times a day (QID) | ORAL | Status: AC
  Administered 2016-11-12 – 2016-11-16 (×17): 1000 mg via ORAL
  Filled 2016-11-11 (×17): qty 2

## 2016-11-11 MED ORDER — BISACODYL 10 MG RE SUPP: 10.0000 mg | Freq: Every day | RECTAL | Status: DC

## 2016-11-11 MED ORDER — SODIUM CHLORIDE 0.9 % IV SOLN
0.0000 ug/kg/h | INTRAVENOUS | Status: DC
  Administered 2016-11-11: 0.7 ug/kg/h via INTRAVENOUS
  Filled 2016-11-11 (×4): qty 2

## 2016-11-11 MED ORDER — ARTIFICIAL TEARS OPHTHALMIC OINT
TOPICAL_OINTMENT | OPHTHALMIC | Status: DC | PRN
  Administered 2016-11-11: 1 via OPHTHALMIC

## 2016-11-11 MED ORDER — FENTANYL CITRATE (PF) 100 MCG/2ML IJ SOLN: 25.0000 ug | INTRAMUSCULAR | Status: DC | PRN

## 2016-11-11 MED ORDER — MORPHINE SULFATE (PF) 2 MG/ML IV SOLN: 2.0000 mg | INTRAVENOUS | Status: DC | PRN

## 2016-11-11 MED ORDER — PHENYLEPHRINE HCL 10 MG/ML IJ SOLN
INTRAVENOUS | Status: DC | PRN
  Administered 2016-11-11: 10 ug/min via INTRAVENOUS

## 2016-11-11 MED ORDER — DEXTROSE 5 % IV SOLN
1.5000 g | Freq: Two times a day (BID) | INTRAVENOUS | Status: AC
  Administered 2016-11-11 – 2016-11-13 (×4): 1.5 g via INTRAVENOUS
  Filled 2016-11-11 (×4): qty 1.5

## 2016-11-11 MED ORDER — FENTANYL CITRATE (PF) 100 MCG/2ML IJ SOLN
INTRAMUSCULAR | Status: DC | PRN
  Administered 2016-11-11: 100 ug via INTRAVENOUS
  Administered 2016-11-11 (×2): 150 ug via INTRAVENOUS
  Administered 2016-11-11 (×4): 100 ug via INTRAVENOUS
  Administered 2016-11-11: 50 ug via INTRAVENOUS
  Administered 2016-11-11 (×2): 100 ug via INTRAVENOUS
  Administered 2016-11-11 (×3): 50 ug via INTRAVENOUS
  Administered 2016-11-11 (×4): 100 ug via INTRAVENOUS

## 2016-11-11 MED ORDER — HEPARIN SODIUM (PORCINE) 1000 UNIT/ML IJ SOLN
INTRAMUSCULAR | Status: AC
  Filled 2016-11-11: qty 1

## 2016-11-11 MED ORDER — DEXMEDETOMIDINE HCL IN NACL 400 MCG/100ML IV SOLN
0.0000 ug/kg/h | INTRAVENOUS | Status: DC
  Administered 2016-11-11 – 2016-11-12 (×4): 1.2 ug/kg/h via INTRAVENOUS
  Filled 2016-11-11 (×37): qty 100

## 2016-11-11 MED ORDER — METOPROLOL TARTRATE 25 MG/10 ML ORAL SUSPENSION: 12.5000 mg | Freq: Two times a day (BID) | ORAL | Status: DC

## 2016-11-11 MED ORDER — METOPROLOL TARTRATE 12.5 MG HALF TABLET
12.5000 mg | ORAL_TABLET | Freq: Two times a day (BID) | ORAL | Status: DC
  Administered 2016-11-13 – 2016-11-15 (×4): 12.5 mg via ORAL
  Filled 2016-11-11 (×4): qty 1

## 2016-11-11 MED ORDER — SODIUM CHLORIDE 0.9 % IJ SOLN
INTRAMUSCULAR | Status: DC | PRN
  Administered 2016-11-11 (×5): 4 mL via TOPICAL

## 2016-11-11 MED ORDER — SUCCINYLCHOLINE CHLORIDE 20 MG/ML IJ SOLN
INTRAMUSCULAR | Status: DC | PRN
  Administered 2016-11-11: 120 mg via INTRAVENOUS

## 2016-11-11 MED ORDER — VANCOMYCIN HCL IN DEXTROSE 1-5 GM/200ML-% IV SOLN
1000.0000 mg | Freq: Once | INTRAVENOUS | Status: DC
  Filled 2016-11-11: qty 200

## 2016-11-11 MED ORDER — SODIUM CHLORIDE 0.9% FLUSH
3.0000 mL | INTRAVENOUS | Status: DC | PRN
Start: 2016-11-12 — End: 2016-11-21

## 2016-11-11 MED ORDER — FENTANYL CITRATE (PF) 250 MCG/5ML IJ SOLN
INTRAMUSCULAR | Status: AC
  Filled 2016-11-11: qty 25

## 2016-11-11 MED ORDER — TRAMADOL HCL 50 MG PO TABS
50.0000 mg | ORAL_TABLET | ORAL | Status: DC | PRN
  Administered 2016-11-12: 100 mg via ORAL
  Administered 2016-11-15: 50 mg via ORAL
  Administered 2016-11-15: 100 mg via ORAL
  Administered 2016-11-16 – 2016-11-17 (×4): 50 mg via ORAL
  Filled 2016-11-11: qty 2
  Filled 2016-11-11 (×2): qty 1
  Filled 2016-11-11: qty 2
  Filled 2016-11-11 (×3): qty 1

## 2016-11-11 MED ORDER — PROTAMINE SULFATE 10 MG/ML IV SOLN
INTRAVENOUS | Status: AC
  Filled 2016-11-11: qty 25

## 2016-11-11 MED ORDER — SUCCINYLCHOLINE CHLORIDE 200 MG/10ML IV SOSY
PREFILLED_SYRINGE | INTRAVENOUS | Status: AC
  Filled 2016-11-11: qty 10

## 2016-11-11 MED ORDER — METOPROLOL TARTRATE 5 MG/5ML IV SOLN
2.5000 mg | INTRAVENOUS | Status: DC | PRN
  Administered 2016-11-12: 5 mg via INTRAVENOUS
  Filled 2016-11-11: qty 5

## 2016-11-11 MED ORDER — DOPAMINE-DEXTROSE 3.2-5 MG/ML-% IV SOLN
INTRAVENOUS | Status: DC | PRN
  Administered 2016-11-11: 2 ug/kg/min via INTRAVENOUS

## 2016-11-11 MED ORDER — ALBUMIN HUMAN 5 % IV SOLN
250.0000 mL | INTRAVENOUS | Status: AC | PRN
Start: 2016-11-11 — End: 2016-11-12
  Administered 2016-11-11: 250 mL via INTRAVENOUS

## 2016-11-11 MED ORDER — PROTAMINE SULFATE 10 MG/ML IV SOLN
INTRAVENOUS | Status: DC | PRN
  Administered 2016-11-11: 310 mg via INTRAVENOUS

## 2016-11-11 MED ORDER — MIDAZOLAM HCL 5 MG/5ML IJ SOLN
INTRAMUSCULAR | Status: DC | PRN
  Administered 2016-11-11: 2 mg via INTRAVENOUS
  Administered 2016-11-11: 3 mg via INTRAVENOUS
  Administered 2016-11-11: 2 mg via INTRAVENOUS
  Administered 2016-11-11: 3 mg via INTRAVENOUS

## 2016-11-11 MED ORDER — 0.9 % SODIUM CHLORIDE (POUR BTL) OPTIME
TOPICAL | Status: DC | PRN
  Administered 2016-11-11: 6000 mL

## 2016-11-11 MED ORDER — SODIUM CHLORIDE 0.45 % IV SOLN: INTRAVENOUS | Status: DC | PRN

## 2016-11-11 MED ORDER — HEPARIN SODIUM (PORCINE) 1000 UNIT/ML IJ SOLN
INTRAMUSCULAR | Status: DC | PRN
  Administered 2016-11-11: 27000 [IU] via INTRAVENOUS
  Administered 2016-11-11: 4000 [IU] via INTRAVENOUS

## 2016-11-11 MED ORDER — SODIUM CHLORIDE 0.9% FLUSH
3.0000 mL | Freq: Two times a day (BID) | INTRAVENOUS | Status: DC
  Administered 2016-11-13 – 2016-11-16 (×5): 3 mL via INTRAVENOUS

## 2016-11-11 MED ORDER — PHENYLEPHRINE 40 MCG/ML (10ML) SYRINGE FOR IV PUSH (FOR BLOOD PRESSURE SUPPORT)
PREFILLED_SYRINGE | INTRAVENOUS | Status: AC
  Filled 2016-11-11: qty 10

## 2016-11-11 MED ORDER — DOPAMINE-DEXTROSE 3.2-5 MG/ML-% IV SOLN
0.0000 ug/kg/min | INTRAVENOUS | Status: DC
  Administered 2016-11-13: 3 ug/kg/min via INTRAVENOUS
  Filled 2016-11-11 (×2): qty 250

## 2016-11-11 MED ORDER — ACETAMINOPHEN 160 MG/5ML PO SOLN
1000.0000 mg | Freq: Four times a day (QID) | ORAL | Status: AC
  Administered 2016-11-11: 1000 mg
  Filled 2016-11-11: qty 40.6

## 2016-11-11 MED ORDER — SODIUM CHLORIDE 0.9 % IV SOLN
INTRAVENOUS | Status: DC | PRN
  Administered 2016-11-11: 14:00:00 via INTRAVENOUS

## 2016-11-11 MED ORDER — MIDAZOLAM HCL 10 MG/2ML IJ SOLN
INTRAMUSCULAR | Status: AC
  Filled 2016-11-11: qty 2

## 2016-11-11 MED ORDER — MAGNESIUM SULFATE 4 GM/100ML IV SOLN
4.0000 g | Freq: Once | INTRAVENOUS | Status: AC
  Administered 2016-11-11: 4 g via INTRAVENOUS
  Filled 2016-11-11: qty 100

## 2016-11-11 MED ORDER — MAGNESIUM SULFATE 4 GM/100ML IV SOLN
INTRAVENOUS | Status: AC
  Filled 2016-11-11: qty 100

## 2016-11-11 MED ORDER — BISACODYL 5 MG PO TBEC
10.0000 mg | DELAYED_RELEASE_TABLET | Freq: Every day | ORAL | Status: DC
  Administered 2016-11-12 – 2016-11-20 (×9): 10 mg via ORAL
  Filled 2016-11-11 (×9): qty 2

## 2016-11-11 MED ORDER — EPINEPHRINE PF 1 MG/10ML IJ SOSY
PREFILLED_SYRINGE | INTRAMUSCULAR | Status: AC
  Filled 2016-11-11: qty 10

## 2016-11-11 MED ORDER — SODIUM CHLORIDE 0.9 % IV SOLN
INTRAVENOUS | Status: DC | PRN
  Administered 2016-11-11: 2.6 [IU]/h via INTRAVENOUS

## 2016-11-11 MED ORDER — ACETAMINOPHEN 160 MG/5ML PO SOLN: 650.0000 mg | Freq: Once | ORAL | Status: AC

## 2016-11-11 MED ORDER — CHLORHEXIDINE GLUCONATE 0.12 % MT SOLN
15.0000 mL | OROMUCOSAL | Status: AC
  Administered 2016-11-11: 15 mL via OROMUCOSAL

## 2016-11-11 MED ORDER — SODIUM CHLORIDE 0.9 % IV SOLN
INTRAVENOUS | Status: DC
  Filled 2016-11-11 (×2): qty 1

## 2016-11-11 MED ORDER — MORPHINE SULFATE (PF) 4 MG/ML IV SOLN
2.0000 mg | INTRAVENOUS | Status: DC | PRN
  Administered 2016-11-12 (×6): 4 mg via INTRAVENOUS
  Filled 2016-11-11 (×7): qty 1

## 2016-11-11 MED ORDER — EPHEDRINE 5 MG/ML INJ
INTRAVENOUS | Status: AC
  Filled 2016-11-11: qty 10

## 2016-11-11 MED ORDER — MIDAZOLAM HCL 2 MG/2ML IJ SOLN
INTRAMUSCULAR | Status: AC
  Filled 2016-11-11: qty 2

## 2016-11-11 MED ORDER — VANCOMYCIN HCL IN DEXTROSE 1-5 GM/200ML-% IV SOLN
1000.0000 mg | Freq: Two times a day (BID) | INTRAVENOUS | Status: AC
  Administered 2016-11-11 – 2016-11-12 (×2): 1000 mg via INTRAVENOUS
  Filled 2016-11-11 (×2): qty 200

## 2016-11-11 MED ORDER — SODIUM CHLORIDE 0.9 % IV SOLN: INTRAVENOUS | Status: DC

## 2016-11-11 MED ORDER — DOCUSATE SODIUM 100 MG PO CAPS
200.0000 mg | ORAL_CAPSULE | Freq: Every day | ORAL | Status: DC
  Administered 2016-11-12 – 2016-11-21 (×10): 200 mg via ORAL
  Filled 2016-11-11 (×10): qty 2

## 2016-11-11 MED ORDER — FLUOXETINE HCL 20 MG PO CAPS
20.0000 mg | ORAL_CAPSULE | Freq: Every day | ORAL | Status: DC
  Administered 2016-11-12 – 2016-11-21 (×10): 20 mg via ORAL
  Filled 2016-11-11 (×10): qty 1

## 2016-11-11 MED ORDER — MIDAZOLAM HCL 2 MG/2ML IJ SOLN
2.0000 mg | INTRAMUSCULAR | Status: DC | PRN
  Administered 2016-11-11 (×4): 2 mg via INTRAVENOUS
  Filled 2016-11-11 (×4): qty 2

## 2016-11-11 MED ORDER — GABAPENTIN 300 MG PO CAPS
300.0000 mg | ORAL_CAPSULE | Freq: Every day | ORAL | Status: DC
  Administered 2016-11-12 – 2016-11-20 (×9): 300 mg via ORAL
  Filled 2016-11-11 (×9): qty 1

## 2016-11-11 MED ORDER — LIDOCAINE 2% (20 MG/ML) 5 ML SYRINGE
INTRAMUSCULAR | Status: AC
  Filled 2016-11-11: qty 5

## 2016-11-11 MED ORDER — ARTIFICIAL TEARS OPHTHALMIC OINT
TOPICAL_OINTMENT | OPHTHALMIC | Status: AC
  Filled 2016-11-11: qty 3.5

## 2016-11-11 MED ORDER — OXYCODONE HCL 5 MG PO TABS
5.0000 mg | ORAL_TABLET | ORAL | Status: DC | PRN
  Administered 2016-11-12 – 2016-11-16 (×17): 10 mg via ORAL
  Administered 2016-11-16: 5 mg via ORAL
  Administered 2016-11-16 – 2016-11-17 (×6): 10 mg via ORAL
  Administered 2016-11-18 – 2016-11-19 (×2): 5 mg via ORAL
  Filled 2016-11-11 (×15): qty 2
  Filled 2016-11-11: qty 1
  Filled 2016-11-11 (×10): qty 2

## 2016-11-11 MED ORDER — ROCURONIUM BROMIDE 100 MG/10ML IV SOLN
INTRAVENOUS | Status: DC | PRN
Start: 2016-11-11 — End: 2016-11-11
  Administered 2016-11-11: 20 mg via INTRAVENOUS
  Administered 2016-11-11: 50 mg via INTRAVENOUS
  Administered 2016-11-11: 100 mg via INTRAVENOUS
  Administered 2016-11-11 (×2): 50 mg via INTRAVENOUS
  Administered 2016-11-11: 30 mg via INTRAVENOUS

## 2016-11-11 MED ORDER — SODIUM CHLORIDE 0.9 % IV SOLN
250.0000 mL | INTRAVENOUS | Status: DC
  Administered 2016-11-12: 250 mL via INTRAVENOUS

## 2016-11-11 MED ORDER — MORPHINE SULFATE (PF) 4 MG/ML IV SOLN
1.0000 mg | INTRAVENOUS | Status: DC | PRN
  Administered 2016-11-11 – 2016-11-12 (×4): 4 mg via INTRAVENOUS
  Filled 2016-11-11 (×2): qty 1

## 2016-11-11 MED ORDER — ORAL CARE MOUTH RINSE
15.0000 mL | Freq: Four times a day (QID) | OROMUCOSAL | Status: DC
  Administered 2016-11-11 – 2016-11-13 (×6): 15 mL via OROMUCOSAL

## 2016-11-11 MED ORDER — PHENYLEPHRINE HCL 10 MG/ML IJ SOLN
INTRAVENOUS | Status: DC | PRN
  Administered 2016-11-11: 30 ug/min via INTRAVENOUS

## 2016-11-11 MED ORDER — PROMETHAZINE HCL 25 MG/ML IJ SOLN: 6.2500 mg | INTRAMUSCULAR | Status: DC | PRN

## 2016-11-11 MED ORDER — ASPIRIN 81 MG PO CHEW
324.0000 mg | CHEWABLE_TABLET | Freq: Every day | ORAL | Status: DC
  Administered 2016-11-20: 324 mg
  Filled 2016-11-11 (×3): qty 4

## 2016-11-11 MED ORDER — SIMVASTATIN 10 MG PO TABS
10.0000 mg | ORAL_TABLET | Freq: Every day | ORAL | Status: DC
  Administered 2016-11-11 – 2016-11-20 (×10): 10 mg via ORAL
  Filled 2016-11-11 (×11): qty 1

## 2016-11-11 MED ORDER — INSULIN REGULAR BOLUS VIA INFUSION
0.0000 [IU] | Freq: Three times a day (TID) | INTRAVENOUS | Status: DC
  Filled 2016-11-11: qty 10

## 2016-11-11 MED ORDER — MEPERIDINE HCL 25 MG/ML IJ SOLN: 6.2500 mg | INTRAMUSCULAR | Status: DC | PRN

## 2016-11-11 MED ORDER — PANTOPRAZOLE SODIUM 40 MG PO TBEC
40.0000 mg | DELAYED_RELEASE_TABLET | Freq: Every day | ORAL | Status: DC
  Administered 2016-11-13: 40 mg via ORAL
  Filled 2016-11-11: qty 1

## 2016-11-11 MED FILL — Heparin Sodium (Porcine) Inj 1000 Unit/ML: INTRAMUSCULAR | Qty: 30 | Status: AC

## 2016-11-11 MED FILL — Magnesium Sulfate Inj 50%: INTRAMUSCULAR | Qty: 10 | Status: AC

## 2016-11-11 MED FILL — Potassium Chloride Inj 2 mEq/ML: INTRAVENOUS | Qty: 10 | Status: AC

## 2016-11-11 SURGICAL SUPPLY — 113 items
ADAPTER CARDIO PERF ANTE/RETRO (ADAPTER) ×6 IMPLANT
ADH SKN CLS APL DERMABOND .7 (GAUZE/BANDAGES/DRESSINGS) ×2
ADPR PRFSN 84XANTGRD RTRGD (ADAPTER) ×4
APL SKNCLS STERI-STRIP NONHPOA (GAUZE/BANDAGES/DRESSINGS) ×2
BAG DECANTER FOR FLEXI CONT (MISCELLANEOUS) ×4 IMPLANT
BANDAGE ACE 4X5 VEL STRL LF (GAUZE/BANDAGES/DRESSINGS) ×4 IMPLANT
BANDAGE ACE 6X5 VEL STRL LF (GAUZE/BANDAGES/DRESSINGS) ×4 IMPLANT
BASKET HEART  (ORDER IN 25'S) (MISCELLANEOUS) ×1
BASKET HEART (ORDER IN 25'S) (MISCELLANEOUS) ×1
BASKET HEART (ORDER IN 25S) (MISCELLANEOUS) ×2 IMPLANT
BENZOIN TINCTURE PRP APPL 2/3 (GAUZE/BANDAGES/DRESSINGS) ×2 IMPLANT
BLADE CLIPPER SURG (BLADE) ×4 IMPLANT
BLADE STERNUM SYSTEM 6 (BLADE) ×4 IMPLANT
BLADE SURG 11 STRL SS (BLADE) ×2 IMPLANT
BLADE SURG 12 STRL SS (BLADE) ×4 IMPLANT
BNDG GAUZE ELAST 4 BULKY (GAUZE/BANDAGES/DRESSINGS) ×4 IMPLANT
CANISTER SUCT 3000ML PPV (MISCELLANEOUS) ×4 IMPLANT
CANNULA ARTERIAL NVNT 3/8 24FR (CANNULA) ×2 IMPLANT
CANNULA GUNDRY RCSP 15FR (MISCELLANEOUS) ×4 IMPLANT
CATH CPB KIT VANTRIGT (MISCELLANEOUS) ×4 IMPLANT
CATH ROBINSON RED A/P 18FR (CATHETERS) ×14 IMPLANT
CATH THORACIC 36FR RT ANG (CATHETERS) ×4 IMPLANT
CHLORAPREP W/TINT 10.5 ML (MISCELLANEOUS) ×2 IMPLANT
CLIP RETRACTION 3.0MM CORONARY (MISCELLANEOUS) ×2 IMPLANT
CLIP TI MEDIUM 24 (CLIP) ×2 IMPLANT
CLIP TI WIDE RED SMALL 24 (CLIP) ×8 IMPLANT
CRADLE DONUT ADULT HEAD (MISCELLANEOUS) ×4 IMPLANT
DERMABOND ADVANCED (GAUZE/BANDAGES/DRESSINGS) ×2
DERMABOND ADVANCED .7 DNX12 (GAUZE/BANDAGES/DRESSINGS) IMPLANT
DRAIN CHANNEL 32F RND 10.7 FF (WOUND CARE) ×4 IMPLANT
DRAPE CARDIOVASCULAR INCISE (DRAPES) ×4
DRAPE SLUSH/WARMER DISC (DRAPES) ×4 IMPLANT
DRAPE SRG 135X102X78XABS (DRAPES) ×2 IMPLANT
DRSG AQUACEL AG ADV 3.5X14 (GAUZE/BANDAGES/DRESSINGS) ×4 IMPLANT
ELECT BLADE 4.0 EZ CLEAN MEGAD (MISCELLANEOUS) ×4
ELECT BLADE 6.5 EXT (BLADE) ×4 IMPLANT
ELECT CAUTERY BLADE 6.4 (BLADE) ×4 IMPLANT
ELECT REM PT RETURN 9FT ADLT (ELECTROSURGICAL) ×8
ELECTRODE BLDE 4.0 EZ CLN MEGD (MISCELLANEOUS) ×2 IMPLANT
ELECTRODE REM PT RTRN 9FT ADLT (ELECTROSURGICAL) ×4 IMPLANT
FELT TEFLON 1X6 (MISCELLANEOUS) ×8 IMPLANT
GAUZE SPONGE 4X4 12PLY STRL (GAUZE/BANDAGES/DRESSINGS) ×8 IMPLANT
GAUZE SPONGE 4X4 12PLY STRL LF (GAUZE/BANDAGES/DRESSINGS) ×4 IMPLANT
GLOVE BIO SURGEON STRL SZ 6 (GLOVE) ×2 IMPLANT
GLOVE BIO SURGEON STRL SZ 6.5 (GLOVE) ×4 IMPLANT
GLOVE BIO SURGEON STRL SZ7.5 (GLOVE) ×12 IMPLANT
GLOVE BIO SURGEONS STRL SZ 6.5 (GLOVE) ×4
GLOVE BIOGEL PI IND STRL 6 (GLOVE) IMPLANT
GLOVE BIOGEL PI IND STRL 6.5 (GLOVE) IMPLANT
GLOVE BIOGEL PI INDICATOR 6 (GLOVE) ×8
GLOVE BIOGEL PI INDICATOR 6.5 (GLOVE) ×8
GOWN STRL REUS W/ TWL LRG LVL3 (GOWN DISPOSABLE) ×8 IMPLANT
GOWN STRL REUS W/TWL LRG LVL3 (GOWN DISPOSABLE) ×40
HEMOSTAT POWDER SURGIFOAM 1G (HEMOSTASIS) ×16 IMPLANT
HEMOSTAT SURGICEL 2X14 (HEMOSTASIS) ×4 IMPLANT
INSERT FOGARTY XLG (MISCELLANEOUS) ×2 IMPLANT
KIT BASIN OR (CUSTOM PROCEDURE TRAY) ×4 IMPLANT
KIT ROOM TURNOVER OR (KITS) ×4 IMPLANT
KIT SUCTION CATH 14FR (SUCTIONS) ×4 IMPLANT
KIT VASOVIEW HEMOPRO VH 3000 (KITS) ×4 IMPLANT
LEAD PACING MYOCARDI (MISCELLANEOUS) ×4 IMPLANT
MARKER GRAFT CORONARY BYPASS (MISCELLANEOUS) ×12 IMPLANT
MARKER SKIN DUAL TIP RULER LAB (MISCELLANEOUS) ×2 IMPLANT
NS IRRIG 1000ML POUR BTL (IV SOLUTION) ×22 IMPLANT
PACK OPEN HEART (CUSTOM PROCEDURE TRAY) ×4 IMPLANT
PAD ARMBOARD 7.5X6 YLW CONV (MISCELLANEOUS) ×8 IMPLANT
PAD ELECT DEFIB RADIOL ZOLL (MISCELLANEOUS) ×4 IMPLANT
PENCIL BUTTON HOLSTER BLD 10FT (ELECTRODE) ×4 IMPLANT
PUNCH AORTIC ROTATE  4.5MM 8IN (MISCELLANEOUS) ×2 IMPLANT
PUNCH AORTIC ROTATE 4.0MM (MISCELLANEOUS) IMPLANT
PUNCH AORTIC ROTATE 4.5MM 8IN (MISCELLANEOUS) IMPLANT
PUNCH AORTIC ROTATE 5MM 8IN (MISCELLANEOUS) IMPLANT
SET CARDIOPLEGIA MPS 5001102 (MISCELLANEOUS) ×2 IMPLANT
SPONGE LAP 18X18 X RAY DECT (DISPOSABLE) ×4 IMPLANT
SPONGE LAP 4X18 X RAY DECT (DISPOSABLE) ×2 IMPLANT
SURGIFLO W/THROMBIN 8M KIT (HEMOSTASIS) ×4 IMPLANT
SUT BONE WAX W31G (SUTURE) ×4 IMPLANT
SUT MNCRL AB 4-0 PS2 18 (SUTURE) ×2 IMPLANT
SUT PROLENE 3 0 SH DA (SUTURE) IMPLANT
SUT PROLENE 3 0 SH1 36 (SUTURE) IMPLANT
SUT PROLENE 4 0 RB 1 (SUTURE) ×8
SUT PROLENE 4 0 SH DA (SUTURE) ×4 IMPLANT
SUT PROLENE 4-0 RB1 .5 CRCL 36 (SUTURE) ×2 IMPLANT
SUT PROLENE 5 0 C 1 36 (SUTURE) IMPLANT
SUT PROLENE 6 0 C 1 24 (SUTURE) ×6 IMPLANT
SUT PROLENE 6 0 C 1 30 (SUTURE) IMPLANT
SUT PROLENE 6 0 CC (SUTURE) ×12 IMPLANT
SUT PROLENE 8 0 BV175 6 (SUTURE) IMPLANT
SUT PROLENE BLUE 7 0 (SUTURE) ×6 IMPLANT
SUT SILK  1 MH (SUTURE)
SUT SILK 1 MH (SUTURE) IMPLANT
SUT SILK 2 0 SH CR/8 (SUTURE) ×2 IMPLANT
SUT SILK 3 0 SH CR/8 (SUTURE) ×2 IMPLANT
SUT STEEL 6MS V (SUTURE) ×8 IMPLANT
SUT STEEL SZ 6 DBL 3X14 BALL (SUTURE) ×4 IMPLANT
SUT VIC AB 1 CTX 36 (SUTURE) ×12
SUT VIC AB 1 CTX36XBRD ANBCTR (SUTURE) ×4 IMPLANT
SUT VIC AB 2-0 CT1 27 (SUTURE) ×4
SUT VIC AB 2-0 CT1 TAPERPNT 27 (SUTURE) IMPLANT
SUT VIC AB 2-0 CTX 27 (SUTURE) IMPLANT
SUT VIC AB 3-0 X1 27 (SUTURE) IMPLANT
SUTURE E-PAK OPEN HEART (SUTURE) ×4 IMPLANT
SYSTEM SAHARA CHEST DRAIN ATS (WOUND CARE) ×4 IMPLANT
TAPE CLOTH SURG 4X10 WHT LF (GAUZE/BANDAGES/DRESSINGS) ×4 IMPLANT
TAPE PAPER 2X10 WHT MICROPORE (GAUZE/BANDAGES/DRESSINGS) ×4 IMPLANT
TOWEL GREEN STERILE (TOWEL DISPOSABLE) ×10 IMPLANT
TRAY FOLEY SILVER 16FR TEMP (SET/KITS/TRAYS/PACK) ×4 IMPLANT
TUBE CONNECTING 12'X1/4 (SUCTIONS) ×1
TUBE CONNECTING 12X1/4 (SUCTIONS) ×1 IMPLANT
TUBING INSUFFLATION (TUBING) ×4 IMPLANT
UNDERPAD 30X30 (UNDERPADS AND DIAPERS) ×4 IMPLANT
WATER STERILE IRR 1000ML POUR (IV SOLUTION) ×8 IMPLANT
YANKAUER SUCT BULB TIP NO VENT (SUCTIONS) ×2 IMPLANT

## 2016-11-11 NOTE — Anesthesia Procedure Notes (Signed)
Procedure Name: Intubation Date/Time: 11/11/2016 8:00 AM Performed by: Julieta Bellini Pre-anesthesia Checklist: Patient identified, Emergency Drugs available, Suction available and Patient being monitored Patient Re-evaluated:Patient Re-evaluated prior to inductionOxygen Delivery Method: Circle system utilized Preoxygenation: Pre-oxygenation with 100% oxygen Intubation Type: IV induction Ventilation: Mask ventilation with difficulty, Two handed mask ventilation required and Oral airway inserted - appropriate to patient size Laryngoscope Size: Mac and 4 Grade View: Grade I Tube type: Oral Tube size: 7.5 mm Number of attempts: 1 Airway Equipment and Method: Stylet Placement Confirmation: ETT inserted through vocal cords under direct vision,  positive ETCO2 and breath sounds checked- equal and bilateral Secured at: 23 cm Tube secured with: Tape Dental Injury: Teeth and Oropharynx as per pre-operative assessment

## 2016-11-11 NOTE — Progress Notes (Signed)
  Echocardiogram Echocardiogram Transesophageal has been performed.  Kelly [name] [name] 11/11/2016, 12:50 PM

## 2016-11-11 NOTE — Progress Notes (Signed)
The patient was examined and preop studies reviewed. There has been no change from the prior exam and the patient is ready for surgery.   Plan CABG on D Dondero

## 2016-11-11 NOTE — Progress Notes (Signed)
*   Echocardiogram Echocardiogram Transesophageal has been performed.  Kelly [name] T [name] 11/11/2016, 12:50 PM

## 2016-11-11 NOTE — Progress Notes (Signed)
Patient ID: Kelly CORRALES, male   DOB: 1956/03/26, 61 y.o.   MRN: [id number]  SICU Evening Rounds:   Hemodynamically stable  CI = 2.2 on milrinone 0.25, dop 3, neo 30.  Has started to wake up on vent.  Urine output good  CT output low  CBC    Component Value Date/Time   WBC 10.4 11/11/2016 1456   RBC 3.56 (L) 11/11/2016 1456   HGB 11.2 (L) 11/11/2016 1510   HCT 33.0 (L) 11/11/2016 1510   PLT 96 (L) 11/11/2016 1456   MCV 96.6 11/11/2016 1456   MCH 32.0 11/11/2016 1456   MCHC 33.1 11/11/2016 1456   RDW 12.4 11/11/2016 1456   LYMPHSABS 1.4 09/24/2016 1015   MONOABS 0.6 09/24/2016 1015   EOSABS 0.2 09/24/2016 1015   BASOSABS 0.0 09/24/2016 1015     BMET    Component Value Date/Time   NA 137 11/11/2016 1510   K 3.8 11/11/2016 1510   CL 101 11/11/2016 1510   CO2 23 11/07/2016 1358   GLUCOSE 219 (H) 11/11/2016 1510   BUN 18 11/11/2016 1510   CREATININE 1.00 11/11/2016 1510   CREATININE 1.22 10/06/2016 1235   CALCIUM 9.2 11/07/2016 1358   GFRNONAA >60 11/07/2016 1358   GFRNONAA 65 10/06/2016 1235   GFRAA >60 11/07/2016 1358   GFRAA 75 10/06/2016 1235     A/P:  Stable postop course. Plan to keep on vent overnight.

## 2016-11-11 NOTE — Anesthesia Procedure Notes (Addendum)
Central Venous Catheter Insertion Performed by: Lillia Abed, anesthesiologist Start/End6/26/2018 6:45 AM, 11/11/2016 7:00 AM Patient location: Pre-op. Preanesthetic checklist: patient identified, IV checked, risks and benefits discussed, surgical consent, monitors and equipment checked, pre-op evaluation, timeout performed and anesthesia consent Patient sedated Hand hygiene performed  and maximum sterile barriers used  Catheter size: 8.5 Fr PA cath and Central line was placed.Sheath introducer Swan type:thermodilution PA Cath depth:48 Procedure performed using ultrasound guided technique. Ultrasound Notes:anatomy identified, needle tip was noted to be adjacent to the nerve/plexus identified, no ultrasound evidence of intravascular and/or intraneural injection and image(s) printed for medical record Attempts: 1 Following insertion, line sutured and dressing applied. Post procedure assessment: blood return through all ports, free fluid flow and no air  Patient tolerated the procedure well with no immediate complications.

## 2016-11-11 NOTE — Transfer of Care (Signed)
Immediate Anesthesia Transfer of Care Note  Patient: Mathews Argyle  Procedure(s) Performed: Procedure(s): CORONARY ARTERY BYPASS GRAFTING (CABG) x 3, USING LEFT MAMMARY ARTERY AND RIGHT GREATER SAPHENOUS VEIN HARVESTED ENDOSCOPICALLY (N/A) TRANSESOPHAGEAL ECHOCARDIOGRAM (TEE) (N/A)  Patient Location: SICU  Anesthesia Type:General  Level of Consciousness: Patient remains intubated per anesthesia plan  Airway & Oxygen Therapy: Patient remains intubated per anesthesia plan and Patient placed on Ventilator (see vital sign flow sheet for setting)  Post-op Assessment: Report given to RN and Post -op Vital signs reviewed and stable  Post vital signs: Reviewed and stable  Last Vitals:  Vitals:   11/11/16 0727 11/11/16 0728  BP:    Pulse: 62 62  Resp: 12 14  Temp:      Last Pain:  Vitals:   11/11/16 0550  TempSrc: Oral      Patients Stated Pain Goal: 3 (64/33/29 5188)  Complications: No apparent anesthesia complications   Patient transported to ICU with standard monitors (HR, BP, SPO2, RR) and emergency drugs/equipment. Controlled ventilation maintained via ambu bag. Report given to bedside RN and respiratory therapist. All questioned answered before leaving.

## 2016-11-11 NOTE — Brief Op Note (Signed)
11/11/2016  12:48 PM  PATIENT:  Kelly [name]  61 y.o. male  PRE-OPERATIVE DIAGNOSIS:  CAD  POST-OPERATIVE DIAGNOSIS:  CAD  PROCEDURE: TRANSESOPHAGEAL ECHOCARDIOGRAM (TEE), MEDIAN STERNOTOMY for CORONARY ARTERY BYPASS GRAFTING (CABG) x 3 (LIMA to LAD, SVG to OM, SVG to PDA) USING LEFT MAMMARY ARTERY AND RIGHT GREATER SAPHENOUS VEIN HARVESTED ENDOSCOPICALLY   SURGEON:  Surgeon(s) and Role:    Ivin Poot, MD - Primary  PHYSICIAN ASSISTANT: Lars Pinks PA-C  ANESTHESIA:   general  EBL:  Total I/O In: 48 [I.V.:1900] Out: 950 [Urine:950]  BLOOD ADMINISTERED:Two FFP  DRAINS: Chest tubes placed in the mediastinal and pleural spaces   COUNTS CORRECT:  YES  DICTATION: .Dragon Dictation  PLAN OF CARE: Admit to inpatient   PATIENT DISPOSITION:  ICU - intubated and hemodynamically stable.   Delay start of Pharmacological VTE agent (>24hrs) due to surgical blood loss or risk of bleeding: yes  BASELINE WEIGHT: 155.1 kg

## 2016-11-12 ENCOUNTER — Encounter (HOSPITAL_COMMUNITY): Payer: Self-pay | Admitting: Cardiothoracic Surgery

## 2016-11-12 ENCOUNTER — Inpatient Hospital Stay (HOSPITAL_COMMUNITY): Payer: BC Managed Care – PPO

## 2016-11-12 LAB — BASIC METABOLIC PANEL
ANION GAP: 5 (ref 5–15)
BUN: 18 mg/dL (ref 6–20)
CHLORIDE: 105 mmol/L (ref 101–111)
CO2: 27 mmol/L (ref 22–32)
Calcium: 8.3 mg/dL — ABNORMAL LOW (ref 8.9–10.3)
Creatinine, Ser: 1.25 mg/dL — ABNORMAL HIGH (ref 0.61–1.24)
GFR calc non Af Amer: >60 mL/min (ref >60)
GLUCOSE: 118 mg/dL — AB (ref 65–99)
Potassium: 4.2 mmol/L (ref 3.5–5.1)
Sodium: 137 mmol/L (ref 135–145)

## 2016-11-12 LAB — POCT I-STAT 3, ART BLOOD GAS (G3+)
Acid-base deficit: 1 mmol/L (ref 0.0–2.0)
Acid-base deficit: 2 mmol/L (ref 0.0–2.0)
Acid-base deficit: 3 mmol/L — ABNORMAL HIGH (ref 0.0–2.0)
Bicarbonate: 24.6 mmol/L (ref 20.0–28.0)
Bicarbonate: 23.5 mmol/L (ref 20.0–28.0)
Bicarbonate: 22.7 mmol/L (ref 20.0–28.0)
Bicarbonate: 22.1 mmol/L (ref 20.0–28.0)
O2 Saturation: 92 %
O2 Saturation: 88 %
O2 Saturation: 88 %
O2 Saturation: 87 %
Patient temperature: 98.4
Patient temperature: 37.5
Patient temperature: 37.7
Patient temperature: 98
TCO2: 26 mmol/L (ref 0–100)
TCO2: 25 mmol/L (ref 0–100)
TCO2: 24 mmol/L (ref 0–100)
TCO2: 23 mmol/L (ref 0–100)
pCO2 arterial: 39.3 mmHg (ref 32.0–48.0)
pCO2 arterial: 38.9 mmHg (ref 32.0–48.0)
pCO2 arterial: 38.7 mmHg (ref 32.0–48.0)
pCO2 arterial: 39.8 mmHg (ref 32.0–48.0)
pH, Arterial: 7.405 (ref 7.350–7.450)
pH, Arterial: 7.391 (ref 7.350–7.450)
pH, Arterial: 7.379 (ref 7.350–7.450)
pH, Arterial: 7.351 (ref 7.350–7.450)
pO2, Arterial: 64 mmHg — ABNORMAL LOW (ref 83.0–108.0)
pO2, Arterial: 57 mmHg — ABNORMAL LOW (ref 83.0–108.0)
pO2, Arterial: 57 mmHg — ABNORMAL LOW (ref 83.0–108.0)
pO2, Arterial: 54 mmHg — ABNORMAL LOW (ref 83.0–108.0)

## 2016-11-12 LAB — BPAM FFP
Blood Product Expiration Date: 201806272359
Blood Product Expiration Date: 201806272359
ISSUE DATE / TIME: 201806261246
ISSUE DATE / TIME: 201806261246
Unit Type and Rh: 6200
Unit Type and Rh: 6200

## 2016-11-12 LAB — CBC
HEMATOCRIT: 34.9 % — AB (ref 39.0–52.0)
HEMATOCRIT: 34.1 % — AB (ref 39.0–52.0)
HEMOGLOBIN: 11.5 g/dL — AB (ref 13.0–17.0)
HEMOGLOBIN: 11.3 g/dL — AB (ref 13.0–17.0)
MCH: 31.6 pg (ref 26.0–34.0)
MCH: 32.2 pg (ref 26.0–34.0)
MCHC: 33 g/dL (ref 30.0–36.0)
MCHC: 33.1 g/dL (ref 30.0–36.0)
MCV: 95.9 fL (ref 78.0–100.0)
MCV: 97.2 fL (ref 78.0–100.0)
Platelets: 121 10*3/uL — ABNORMAL LOW (ref 150–400)
Platelets: 157 10*3/uL (ref 150–400)
RBC: 3.64 MIL/uL — ABNORMAL LOW (ref 4.22–5.81)
RBC: 3.51 MIL/uL — ABNORMAL LOW (ref 4.22–5.81)
RDW: 12.4 % (ref 11.5–15.5)
RDW: 12.7 % (ref 11.5–15.5)
WBC: 12.1 10*3/uL — AB (ref 4.0–10.5)
WBC: 19.2 10*3/uL — ABNORMAL HIGH (ref 4.0–10.5)

## 2016-11-12 LAB — GLUCOSE, CAPILLARY
Glucose-Capillary: 105 mg/dL — ABNORMAL HIGH (ref 65–99)
Glucose-Capillary: 122 mg/dL — ABNORMAL HIGH (ref 65–99)
Glucose-Capillary: 123 mg/dL — ABNORMAL HIGH (ref 65–99)
Glucose-Capillary: 129 mg/dL — ABNORMAL HIGH (ref 65–99)
Glucose-Capillary: 133 mg/dL — ABNORMAL HIGH (ref 65–99)
Glucose-Capillary: 134 mg/dL — ABNORMAL HIGH (ref 65–99)
Glucose-Capillary: 145 mg/dL — ABNORMAL HIGH (ref 65–99)
Glucose-Capillary: 151 mg/dL — ABNORMAL HIGH (ref 65–99)
Glucose-Capillary: 152 mg/dL — ABNORMAL HIGH (ref 65–99)
Glucose-Capillary: 152 mg/dL — ABNORMAL HIGH (ref 65–99)
Glucose-Capillary: 157 mg/dL — ABNORMAL HIGH (ref 65–99)
Glucose-Capillary: 160 mg/dL — ABNORMAL HIGH (ref 65–99)
Glucose-Capillary: 247 mg/dL — ABNORMAL HIGH (ref 65–99)
Glucose-Capillary: 303 mg/dL — ABNORMAL HIGH (ref 65–99)

## 2016-11-12 LAB — PREPARE FRESH FROZEN PLASMA
UNIT DIVISION: 0
Unit division: 0

## 2016-11-12 LAB — POCT I-STAT, CHEM 8
BUN: 19 mg/dL (ref 6–20)
BUN: 24 mg/dL — ABNORMAL HIGH (ref 6–20)
Calcium, Ion: 1.14 mmol/L — ABNORMAL LOW (ref 1.15–1.40)
Calcium, Ion: 1.18 mmol/L (ref 1.15–1.40)
Chloride: 101 mmol/L (ref 101–111)
Chloride: 99 mmol/L — ABNORMAL LOW (ref 101–111)
Creatinine, Ser: 1.2 mg/dL (ref 0.61–1.24)
Creatinine, Ser: 1.5 mg/dL — ABNORMAL HIGH (ref 0.61–1.24)
Glucose, Bld: 150 mg/dL — ABNORMAL HIGH (ref 65–99)
Glucose, Bld: 183 mg/dL — ABNORMAL HIGH (ref 65–99)
HCT: 32 % — ABNORMAL LOW (ref 39.0–52.0)
HCT: 33 % — ABNORMAL LOW (ref 39.0–52.0)
Hemoglobin: 10.9 g/dL — ABNORMAL LOW (ref 13.0–17.0)
Hemoglobin: 11.2 g/dL — ABNORMAL LOW (ref 13.0–17.0)
Potassium: 4.2 mmol/L (ref 3.5–5.1)
Potassium: 4.4 mmol/L (ref 3.5–5.1)
Sodium: 135 mmol/L (ref 135–145)
Sodium: 138 mmol/L (ref 135–145)
TCO2: 21 mmol/L (ref 0–100)
TCO2: 24 mmol/L (ref 0–100)

## 2016-11-12 LAB — MAGNESIUM
MAGNESIUM: 1.6 mg/dL — AB (ref 1.7–2.4)
Magnesium: 1.7 mg/dL (ref 1.7–2.4)

## 2016-11-12 LAB — CREATININE, SERUM
Creatinine, Ser: 1.72 mg/dL — ABNORMAL HIGH (ref 0.61–1.24)
GFR calc Af Amer: 48 mL/min — ABNORMAL LOW (ref >60)
GFR, EST NON AFRICAN AMERICAN: 42 mL/min — AB (ref >60)

## 2016-11-12 MED ORDER — INSULIN ASPART 100 UNIT/ML ~~LOC~~ SOLN
0.0000 [IU] | SUBCUTANEOUS | Status: DC
  Administered 2016-11-12: 8 [IU] via SUBCUTANEOUS
  Administered 2016-11-12: 16 [IU] via SUBCUTANEOUS
  Administered 2016-11-12: 4 [IU] via SUBCUTANEOUS

## 2016-11-12 MED ORDER — ENOXAPARIN SODIUM 40 MG/0.4ML ~~LOC~~ SOLN
40.0000 mg | SUBCUTANEOUS | Status: DC
  Administered 2016-11-12: 40 mg via SUBCUTANEOUS
  Filled 2016-11-12: qty 0.4

## 2016-11-12 MED ORDER — FUROSEMIDE 10 MG/ML IJ SOLN
40.0000 mg | Freq: Two times a day (BID) | INTRAMUSCULAR | Status: DC
Start: 2016-11-12 — End: 2016-11-13
  Administered 2016-11-12 – 2016-11-13 (×3): 40 mg via INTRAVENOUS
  Filled 2016-11-12 (×3): qty 4

## 2016-11-12 MED ORDER — AMIODARONE HCL IN DEXTROSE 360-4.14 MG/200ML-% IV SOLN
INTRAVENOUS | Status: AC
  Filled 2016-11-12: qty 200

## 2016-11-12 MED ORDER — FENTANYL CITRATE (PF) 100 MCG/2ML IJ SOLN
50.0000 ug | INTRAMUSCULAR | Status: DC | PRN
  Administered 2016-11-12 – 2016-11-13 (×10): 50 ug via INTRAVENOUS
  Filled 2016-11-12 (×10): qty 2

## 2016-11-12 MED ORDER — INSULIN ASPART 100 UNIT/ML ~~LOC~~ SOLN: 0.0000 [IU] | SUBCUTANEOUS | Status: DC

## 2016-11-12 MED ORDER — MAGNESIUM SULFATE 2 GM/50ML IV SOLN
2.0000 g | Freq: Once | INTRAVENOUS | Status: AC
  Administered 2016-11-12: 2 g via INTRAVENOUS
  Filled 2016-11-12: qty 50

## 2016-11-12 MED ORDER — INSULIN DETEMIR 100 UNIT/ML ~~LOC~~ SOLN
8.0000 [IU] | Freq: Once | SUBCUTANEOUS | Status: AC
  Administered 2016-11-12: 8 [IU] via SUBCUTANEOUS
  Filled 2016-11-12: qty 0.08

## 2016-11-12 MED ORDER — INSULIN DETEMIR 100 UNIT/ML ~~LOC~~ SOLN
16.0000 [IU] | Freq: Every day | SUBCUTANEOUS | Status: DC
  Filled 2016-11-12: qty 0.16

## 2016-11-12 MED ORDER — INSULIN ASPART 100 UNIT/ML ~~LOC~~ SOLN: 3.0000 [IU] | Freq: Three times a day (TID) | SUBCUTANEOUS | Status: DC

## 2016-11-12 NOTE — Procedures (Signed)
Extubation Procedure Note  Patient Details:   Name: Kelly [name] DOB: 07/06/1955 MRN: [id number]   Airway Documentation:     Evaluation  O2 sats: stable throughout Complications: No apparent complications Patient did tolerate procedure well. Bilateral Breath Sounds: Clear, Diminished   Yes   Pt extubated to 50% venti mask.  NIF -20, VC ~ 912 mL.  No stridor noted.  RN at bedside.  Donnetta Hail 11/12/2016, 8:08 AM

## 2016-11-12 NOTE — Progress Notes (Signed)
Rapid Wean protocol started at this time   

## 2016-11-12 NOTE — Progress Notes (Signed)
Patient [name], male   DOB: 1956/02/24, 61 y.o.   MRN: [id number] EVENING ROUNDS NOTE :     [address]       [place] [address]             [phone]                 1 Day Post-Op Procedure(s) (LRB): CORONARY ARTERY BYPASS GRAFTING (CABG) x 3, USING LEFT MAMMARY ARTERY AND RIGHT GREATER SAPHENOUS VEIN HARVESTED ENDOSCOPICALLY (N/A) TRANSESOPHAGEAL ECHOCARDIOGRAM (TEE) (N/A)  Total Length of Stay:  LOS: 1 day  BP (!) 117/53   Pulse 81   Temp 99 F (37.2 C) (Oral)   Resp (!) 23   Wt (!) 361 lb 3.2 oz (163.8 kg)   SpO2 (!) 89%   BMI 45.15 kg/m   .Intake/Output      06/27 0701 - 06/28 0700   I.V. (mL/kg) 1148.5 (7)   Blood    NG/GT    IV Piggyback 450   Total Intake(mL/kg) 1598.5 (9.8)   Urine (mL/kg/hr) 590 (0.3)   Emesis/NG output    Blood    Chest Tube 120 (0.1)   Total Output 710   Net +888.5         . sodium chloride Stopped (11/12/16 1100)  . sodium chloride    . sodium chloride Stopped (11/12/16 1300)  . amiodarone    . cefUROXime (ZINACEF)  IV Stopped (11/12/16 0998)  . DOPamine 3 mcg/kg/min (11/12/16 1700)  . lactated ringers 20 mL/hr at 11/12/16 1700  . lactated ringers 20 mL/hr at 11/11/16 1515  . magnesium sulfate 1 - 4 g bolus IVPB 2 g (11/12/16 1831)  . milrinone 0.25 mcg/kg/min (11/12/16 1700)  . nitroGLYCERIN 0 mcg/min (11/11/16 1515)  . phenylephrine (NEO-SYNEPHRINE) Adult infusion Stopped (11/12/16 1754)     Lab Results  Component Value Date   WBC 19.2 (H) 11/12/2016   HGB 11.2 (L) 11/12/2016   HCT 33.0 (L) 11/12/2016   PLT 157 11/12/2016   GLUCOSE 183 (H) 11/12/2016   CHOL 137 01/28/2016   TRIG 281 (H) 01/28/2016   HDL 40 01/28/2016   LDLCALC 41 01/28/2016   ALT 23 11/07/2016   AST 32 11/07/2016   NA 135 11/12/2016   K 4.4 11/12/2016   CL 99 (L) 11/12/2016   CREATININE 1.50 (H) 11/12/2016   BUN 24 (H) 11/12/2016   CO2 27 11/12/2016   TSH 1.35 01/28/2016   INR 1.26 11/11/2016   HGBA1C 8.4 (H)  11/07/2016   MICROALBUR 1.8 01/28/2016   Extubated this am o2 sat low just started on bipap    Grace Isaac MD  Beeper [phone] Office [phone] 11/12/2016 7:07 PM

## 2016-11-12 NOTE — Progress Notes (Addendum)
TCTS DAILY ICU PROGRESS NOTE                   [address]            [place] [address]          [phone]   1 Day Post-Op Procedure(s) (LRB): CORONARY ARTERY BYPASS GRAFTING (CABG) x 3, USING LEFT MAMMARY ARTERY AND RIGHT GREATER SAPHENOUS VEIN HARVESTED ENDOSCOPICALLY (N/A) TRANSESOPHAGEAL ECHOCARDIOGRAM (TEE) (N/A)  Total Length of Stay:  LOS: 1 day   Subjective: Patient awake, alert. In the process of being extubated.  Objective: Vital signs in last 24 hours: Temp:  [97.3 F (36.3 C)-99.5 F (37.5 C)] 99.3 F (37.4 C) (06/27 0715) Pulse Rate:  [76-90] 83 (06/27 0715) Cardiac Rhythm: A-V Sequential paced (06/27 0100) Resp:  [14-25] 21 (06/27 0715) BP: (92-131)/(61-82) 106/65 (06/27 0700) SpO2:  [90 %-98 %] 92 % (06/27 0715) Arterial Line BP: (79-147)/(47-70) 138/67 (06/27 0715) FiO2 (%):  [40 %-70 %] 40 % (06/27 0724) Weight:  [163.8 kg (361 lb 3.2 oz)] 163.8 kg (361 lb 3.2 oz) (06/27 0500)  Filed Weights   11/11/16 0550 11/12/16 0500  Weight: (!) 155.1 kg (342 lb) (!) 163.8 kg (361 lb 3.2 oz)    Weight change: 8.709 kg (19 lb 3.2 oz)   Hemodynamic parameters for last 24 hours: PAP: (31-79)/(14-40) 52/30 CO:  [4.7 L/min-6.7 L/min] 6.3 L/min CI:  [1.7 L/min/m2-2.4 L/min/m2] 2.3 L/min/m2  Intake/Output from previous day: 06/26 0701 - 06/27 0700 In: 5711.6 [I.V.:4291.6; Blood:880; NG/GT:30; IV Piggyback:510] Out: 4818 [Urine:2605; Emesis/NG output:300; Blood:600; Chest Tube:290]  Intake/Output this shift: No intake/output data recorded.  Current Meds: Scheduled Meds: . acetaminophen  1,000 mg Oral Q6H   Or  . acetaminophen (TYLENOL) oral liquid 160 mg/5 mL  1,000 mg Per Tube Q6H  . aspirin EC  325 mg Oral Daily   Or  . aspirin  324 mg Per Tube Daily  . bisacodyl  10 mg Oral Daily   Or  . bisacodyl  10 mg Rectal Daily  . chlorhexidine gluconate (MEDLINE KIT)  15 mL Mouth Rinse BID  . docusate sodium  200 mg Oral Daily  . FLUoxetine   20 mg Oral Daily  . furosemide  40 mg Intravenous BID  . gabapentin  300 mg Oral QHS  . insulin regular  0-10 Units Intravenous TID WC  . levalbuterol  1.25 mg Nebulization Q6H  . mouth rinse  15 mL Mouth Rinse QID  . metoCLOPramide (REGLAN) injection  10 mg Intravenous Q6H  . metoprolol tartrate  12.5 mg Oral BID   Or  . metoprolol tartrate  12.5 mg Per Tube BID  . [START ON 11/13/2016] pantoprazole  40 mg Oral Daily  . simvastatin  10 mg Oral QHS  . sodium chloride flush  3 mL Intravenous Q12H  . tamsulosin  0.4 mg Oral QPM   Continuous Infusions: . sodium chloride    . sodium chloride    . sodium chloride 10 mL/hr at 11/11/16 2000  . albumin human    . cefUROXime (ZINACEF)  IV 1.5 g (11/12/16 0742)  . dexmedetomidine Stopped (11/12/16 0545)  . DOPamine 3 mcg/kg/min (11/12/16 0600)  . insulin (NOVOLIN-R) infusion 2.5 Units/hr (11/12/16 0600)  . lactated ringers 20 mL/hr at 11/11/16 2000  . lactated ringers 20 mL/hr at 11/11/16 1515  . milrinone 0.25 mcg/kg/min (11/12/16 0600)  . nitroGLYCERIN 0 mcg/min (11/11/16 1515)  . phenylephrine (NEO-SYNEPHRINE) Adult infusion 20 mcg/min (11/12/16  0600)  . vancomycin Stopped (11/11/16 2249)   PRN Meds:.sodium chloride, albumin human, metoprolol tartrate, midazolam, morphine injection, ondansetron (ZOFRAN) IV, oxyCODONE, sodium chloride flush, traMADol, zolpidem  General appearance: alert, cooperative and no distress Neurologic: intact Heart: RRR Lungs: Diminshed at bases Abdomen: Soft, obese, non tender, occasional bowel sounds Extremities: SCD on the left and LE on the right. Wound: Aquacel intact. RLE dressings are clean and dry  Lab Results: CBC: Recent Labs  11/11/16 2233 11/12/16 0022 11/12/16 0419  WBC 11.6*  --  12.1*  HGB 11.4* 10.9* 11.5*  HCT 34.9* 32.0* 34.9*  PLT 115*  --  121*   BMET:  Recent Labs  11/12/16 0022 11/12/16 0419  NA 138 137  K 4.2 4.2  CL 101 105  CO2  --  27  GLUCOSE 150* 118*  BUN 19  18  CREATININE 1.20 1.25*  CALCIUM  --  8.3*    CMET: Lab Results  Component Value Date   WBC 12.1 (H) 11/12/2016   HGB 11.5 (L) 11/12/2016   HCT 34.9 (L) 11/12/2016   PLT 121 (L) 11/12/2016   GLUCOSE 118 (H) 11/12/2016   CHOL 137 01/28/2016   TRIG 281 (H) 01/28/2016   HDL 40 01/28/2016   LDLCALC 41 01/28/2016   ALT 23 11/07/2016   AST 32 11/07/2016   NA 137 11/12/2016   K 4.2 11/12/2016   CL 105 11/12/2016   CREATININE 1.25 (H) 11/12/2016   BUN 18 11/12/2016   CO2 27 11/12/2016   TSH 1.35 01/28/2016   INR 1.26 11/11/2016   HGBA1C 8.4 (H) 11/07/2016   MICROALBUR 1.8 01/28/2016    PT/INR:  Recent Labs  11/11/16 1456  LABPROT 15.9*  INR 1.26   Radiology: Dg Chest Port 1 View  Result Date: 11/11/2016 CLINICAL DATA:  Intubated patient status post CABG. EXAM: PORTABLE CHEST 1 VIEW COMPARISON:  Chest radiograph 11/07/2016 FINDINGS: Endotracheal tube tip is at the level of the clavicular heads, in satisfactory position. Orogastric tube courses beyond the diaphragm. There is a a right internal jugular vein approach pulmonary arterial catheter with tip overlying the main pulmonary artery. Left chest tube tip is at the left lung base. Median sternotomy wires are now present. There is cardiomegaly with parahilar opacities indicating mild pulmonary edema. There is bibasilar atelectasis. IMPRESSION: 1. Endotracheal tube tip in radiographically appropriate position. 2. PA catheter overlying the main pulmonary artery. 3. Cardiomegaly and mild pulmonary edema. Electronically Signed   By: Ulyses Jarred M.D.   On: 11/11/2016 15:57   Assessment/Plan: S/P Procedure(s) (LRB): CORONARY ARTERY BYPASS GRAFTING (CABG) x 3, USING LEFT MAMMARY ARTERY AND RIGHT GREATER SAPHENOUS VEIN HARVESTED ENDOSCOPICALLY (N/A) TRANSESOPHAGEAL ECHOCARDIOGRAM (TEE) (N/A)  1. CV-CO/CI 9.5/3.5. EKG reviewed. Paced this am.On Dopamine and Milrinone drips.  2. Pulmonary-ABG reviewed this am. Patient has been weaning  to be extubated this am. CXR this am shows cardiomegaly, mild pulmonary edema. Chest tube output 290 cc since surgery. Encourage incentive spirometer after extubation. 3. Volume overload-Given Lasix 40 mg IV this am. 4. ABL anemia-H and H stable at 11.5 and 34.9 5. DM-CBGs 129/122/105. Will transition off Insulin drip 6. Thrombocytopenia-platelets 121,000 7. Start DVT proph Lovenox this evening 8. Please see progression notes   Arnoldo Lenis 11/12/2016 7:49 AM   Pulmonary status improved thru the night- extubate Urine output remains low- cont 3 mcg dopamine, bid lasix Agree with above assessment and and plan

## 2016-11-12 NOTE — Care Management Note (Signed)
Case Management Note Marvetta Gibbons RN, BSN Unit 2W-Case Manager-- [place] coverage [phone]  Patient Details  Name: Kelly [name] MRN: [id number] Date of Birth: 1955-07-21  Subjective/Objective:    Pt admitted s/p CABGx3 on 11/11/16                Action/Plan: PTA pt lived at home with spouse- anticipate return home- CM to follow for d/c needs  Expected Discharge Date:                  Expected Discharge Plan:  Home/Self Care  In-House Referral:     Discharge planning Services  CM Consult  Post Acute Care Choice:    Choice offered to:     DME Arranged:    DME Agency:     HH Arranged:    HH Agency:     Status of Service:  In process, will continue to follow  If discussed at Long Length of Stay Meetings, dates discussed:    Discharge Disposition:   Additional Comments:  Dawayne Patricia, RN 11/12/2016, 10:57 AM

## 2016-11-13 ENCOUNTER — Inpatient Hospital Stay (HOSPITAL_COMMUNITY): Payer: BC Managed Care – PPO

## 2016-11-13 LAB — GLUCOSE, CAPILLARY
Glucose-Capillary: 232 mg/dL — ABNORMAL HIGH (ref 65–99)
Glucose-Capillary: 254 mg/dL — ABNORMAL HIGH (ref 65–99)
Glucose-Capillary: 278 mg/dL — ABNORMAL HIGH (ref 65–99)
Glucose-Capillary: 282 mg/dL — ABNORMAL HIGH (ref 65–99)
Glucose-Capillary: 284 mg/dL — ABNORMAL HIGH (ref 65–99)
Glucose-Capillary: 284 mg/dL — ABNORMAL HIGH (ref 65–99)
Glucose-Capillary: 285 mg/dL — ABNORMAL HIGH (ref 65–99)
Glucose-Capillary: 307 mg/dL — ABNORMAL HIGH (ref 65–99)
Glucose-Capillary: 307 mg/dL — ABNORMAL HIGH (ref 65–99)
Glucose-Capillary: 312 mg/dL — ABNORMAL HIGH (ref 65–99)
Glucose-Capillary: 312 mg/dL — ABNORMAL HIGH (ref 65–99)
Glucose-Capillary: 315 mg/dL — ABNORMAL HIGH (ref 65–99)
Glucose-Capillary: 316 mg/dL — ABNORMAL HIGH (ref 65–99)
Glucose-Capillary: 317 mg/dL — ABNORMAL HIGH (ref 65–99)
Glucose-Capillary: 350 mg/dL — ABNORMAL HIGH (ref 65–99)
Glucose-Capillary: 352 mg/dL — ABNORMAL HIGH (ref 65–99)

## 2016-11-13 LAB — BLOOD GAS, ARTERIAL
Acid-base deficit: 3.8 mmol/L — ABNORMAL HIGH (ref 0.0–2.0)
Bicarbonate: 21 mmol/L (ref 20.0–28.0)
Delivery systems: POSITIVE
Drawn by: 41977
Expiratory PAP: 6
FIO2: 60
Inspiratory PAP: 12
O2 Saturation: 91 %
Patient temperature: 98.6
RATE: 15 resp/min
pCO2 arterial: 40.3 mmHg (ref 32.0–48.0)
pH, Arterial: 7.338 — ABNORMAL LOW (ref 7.350–7.450)
pO2, Arterial: 66.2 mmHg — ABNORMAL LOW (ref 83.0–108.0)

## 2016-11-13 LAB — POCT I-STAT, CHEM 8
BUN: 34 mg/dL — ABNORMAL HIGH (ref 6–20)
Calcium, Ion: 1.16 mmol/L (ref 1.15–1.40)
Chloride: 95 mmol/L — ABNORMAL LOW (ref 101–111)
Creatinine, Ser: 1.5 mg/dL — ABNORMAL HIGH (ref 0.61–1.24)
Glucose, Bld: 328 mg/dL — ABNORMAL HIGH (ref 65–99)
HCT: 28 % — ABNORMAL LOW (ref 39.0–52.0)
Hemoglobin: 9.5 g/dL — ABNORMAL LOW (ref 13.0–17.0)
Potassium: 4.2 mmol/L (ref 3.5–5.1)
Sodium: 132 mmol/L — ABNORMAL LOW (ref 135–145)
TCO2: 23 mmol/L (ref 0–100)

## 2016-11-13 LAB — COOXEMETRY PANEL
Carboxyhemoglobin: 1.4 % (ref 0.5–1.5)
Methemoglobin: 1.6 % — ABNORMAL HIGH (ref 0.0–1.5)
O2 SAT: 74.4 %
TOTAL HEMOGLOBIN: 10.2 g/dL — AB (ref 12.0–16.0)

## 2016-11-13 LAB — POCT I-STAT 3, ART BLOOD GAS (G3+)
Acid-base deficit: 3 mmol/L — ABNORMAL HIGH (ref 0.0–2.0)
Bicarbonate: 21.9 mmol/L (ref 20.0–28.0)
O2 Saturation: 95 %
Patient temperature: 97.5
TCO2: 23 mmol/L (ref 0–100)
pCO2 arterial: 37.4 mmHg (ref 32.0–48.0)
pH, Arterial: 7.373 (ref 7.350–7.450)
pO2, Arterial: 75 mmHg — ABNORMAL LOW (ref 83.0–108.0)

## 2016-11-13 LAB — BASIC METABOLIC PANEL
Anion gap: 11 (ref 5–15)
BUN: 29 mg/dL — ABNORMAL HIGH (ref 6–20)
CHLORIDE: 99 mmol/L — AB (ref 101–111)
CO2: 21 mmol/L — AB (ref 22–32)
CREATININE: 1.82 mg/dL — AB (ref 0.61–1.24)
Calcium: 8 mg/dL — ABNORMAL LOW (ref 8.9–10.3)
GFR calc non Af Amer: 39 mL/min — ABNORMAL LOW (ref >60)
GFR, EST AFRICAN AMERICAN: 45 mL/min — AB (ref >60)
GLUCOSE: 350 mg/dL — AB (ref 65–99)
Potassium: 4.5 mmol/L (ref 3.5–5.1)
Sodium: 131 mmol/L — ABNORMAL LOW (ref 135–145)

## 2016-11-13 LAB — CBC
HCT: 30.9 % — ABNORMAL LOW (ref 39.0–52.0)
Hemoglobin: 10.2 g/dL — ABNORMAL LOW (ref 13.0–17.0)
MCH: 32.1 pg (ref 26.0–34.0)
MCHC: 33 g/dL (ref 30.0–36.0)
MCV: 97.2 fL (ref 78.0–100.0)
PLATELETS: 92 10*3/uL — AB (ref 150–400)
RBC: 3.18 MIL/uL — AB (ref 4.22–5.81)
RDW: 12.6 % (ref 11.5–15.5)
WBC: 12.4 10*3/uL — ABNORMAL HIGH (ref 4.0–10.5)

## 2016-11-13 MED ORDER — DEXTROSE 50 % IV SOLN: 25.0000 mL | INTRAVENOUS | Status: DC | PRN

## 2016-11-13 MED ORDER — SODIUM CHLORIDE 0.9% FLUSH: 10.0000 mL | INTRAVENOUS | Status: DC | PRN

## 2016-11-13 MED ORDER — INSULIN GLARGINE 100 UNIT/ML ~~LOC~~ SOLN
50.0000 [IU] | Freq: Two times a day (BID) | SUBCUTANEOUS | Status: DC
  Administered 2016-11-13 (×2): 50 [IU] via SUBCUTANEOUS
  Filled 2016-11-13 (×3): qty 0.5

## 2016-11-13 MED ORDER — INSULIN DETEMIR 100 UNIT/ML ~~LOC~~ SOLN: 50.0000 [IU] | Freq: Two times a day (BID) | SUBCUTANEOUS | Status: DC

## 2016-11-13 MED ORDER — SODIUM CHLORIDE 0.9% FLUSH
10.0000 mL | Freq: Two times a day (BID) | INTRAVENOUS | Status: DC
  Administered 2016-11-13: 10 mL

## 2016-11-13 MED ORDER — FUROSEMIDE 10 MG/ML IJ SOLN: 80.0000 mg | Freq: Two times a day (BID) | INTRAMUSCULAR | Status: DC

## 2016-11-13 MED ORDER — INSULIN ASPART 100 UNIT/ML ~~LOC~~ SOLN
0.0000 [IU] | SUBCUTANEOUS | Status: DC
  Administered 2016-11-13 (×2): 16 [IU] via SUBCUTANEOUS
  Administered 2016-11-13 – 2016-11-14 (×2): 12 [IU] via SUBCUTANEOUS
  Administered 2016-11-14: 4 [IU] via SUBCUTANEOUS
  Administered 2016-11-14: 8 [IU] via SUBCUTANEOUS

## 2016-11-13 MED ORDER — FUROSEMIDE 10 MG/ML IJ SOLN
40.0000 mg | Freq: Once | INTRAMUSCULAR | Status: AC
  Administered 2016-11-13: 40 mg via INTRAVENOUS
  Filled 2016-11-13: qty 4

## 2016-11-13 MED ORDER — INSULIN REGULAR BOLUS VIA INFUSION
0.0000 [IU] | Freq: Three times a day (TID) | INTRAVENOUS | Status: DC
  Filled 2016-11-13: qty 10

## 2016-11-13 MED ORDER — INSULIN ASPART 100 UNIT/ML ~~LOC~~ SOLN
0.0000 [IU] | SUBCUTANEOUS | Status: DC
  Administered 2016-11-13: 16 [IU] via SUBCUTANEOUS
  Administered 2016-11-13: 12 [IU] via SUBCUTANEOUS

## 2016-11-13 MED ORDER — FUROSEMIDE 10 MG/ML IJ SOLN
80.0000 mg | Freq: Two times a day (BID) | INTRAMUSCULAR | Status: DC
  Administered 2016-11-13 – 2016-11-17 (×9): 80 mg via INTRAVENOUS
  Filled 2016-11-13 (×9): qty 8

## 2016-11-13 MED ORDER — ENOXAPARIN SODIUM 40 MG/0.4ML ~~LOC~~ SOLN
40.0000 mg | SUBCUTANEOUS | Status: DC
  Administered 2016-11-13 – 2016-11-21 (×9): 40 mg via SUBCUTANEOUS
  Filled 2016-11-13 (×9): qty 0.4

## 2016-11-13 MED ORDER — DEXTROSE-NACL 5-0.45 % IV SOLN: INTRAVENOUS | Status: DC

## 2016-11-13 MED ORDER — AMIODARONE HCL IN DEXTROSE 360-4.14 MG/200ML-% IV SOLN
30.0000 mg/h | INTRAVENOUS | Status: DC
  Administered 2016-11-14 – 2016-11-16 (×6): 30 mg/h via INTRAVENOUS
  Filled 2016-11-13 (×5): qty 200

## 2016-11-13 MED ORDER — SODIUM CHLORIDE 0.9 % IV SOLN: INTRAVENOUS | Status: DC

## 2016-11-13 MED ORDER — FENTANYL CITRATE (PF) 100 MCG/2ML IJ SOLN
50.0000 ug | INTRAMUSCULAR | Status: DC | PRN
  Administered 2016-11-13 – 2016-11-14 (×2): 50 ug via INTRAVENOUS
  Filled 2016-11-13 (×3): qty 2

## 2016-11-13 MED ORDER — SODIUM CHLORIDE 0.9 % IV SOLN
INTRAVENOUS | Status: DC
  Administered 2016-11-13: 5.1 [IU]/h via INTRAVENOUS
  Filled 2016-11-13: qty 1

## 2016-11-13 MED ORDER — FENTANYL 50 MCG/HR TD PT72
75.0000 ug | MEDICATED_PATCH | TRANSDERMAL | Status: DC
  Administered 2016-11-13 – 2016-11-19 (×3): 75 ug via TRANSDERMAL
  Filled 2016-11-13: qty 1
  Filled 2016-11-13 (×2): qty 3

## 2016-11-13 MED ORDER — AMIODARONE HCL IN DEXTROSE 360-4.14 MG/200ML-% IV SOLN
60.0000 mg/h | INTRAVENOUS | Status: AC
  Administered 2016-11-13 – 2016-11-14 (×2): 60 mg/h via INTRAVENOUS

## 2016-11-13 MED ORDER — AMIODARONE LOAD VIA INFUSION
150.0000 mg | Freq: Once | INTRAVENOUS | Status: AC
  Administered 2016-11-13: 150 mg via INTRAVENOUS

## 2016-11-13 MED ORDER — ORAL CARE MOUTH RINSE
15.0000 mL | Freq: Two times a day (BID) | OROMUCOSAL | Status: DC
  Administered 2016-11-14 – 2016-11-21 (×9): 15 mL via OROMUCOSAL

## 2016-11-13 MED ORDER — CHLORHEXIDINE GLUCONATE CLOTH 2 % EX PADS: 6.0000 | MEDICATED_PAD | Freq: Every day | CUTANEOUS | Status: DC

## 2016-11-13 MED FILL — Sodium Bicarbonate IV Soln 8.4%: INTRAVENOUS | Qty: 50 | Status: AC

## 2016-11-13 MED FILL — Heparin Sodium (Porcine) Inj 1000 Unit/ML: INTRAMUSCULAR | Qty: 10 | Status: AC

## 2016-11-13 MED FILL — Sodium Chloride IV Soln 0.9%: INTRAVENOUS | Qty: 2000 | Status: AC

## 2016-11-13 MED FILL — Electrolyte-R (PH 7.4) Solution: INTRAVENOUS | Qty: 4000 | Status: AC

## 2016-11-13 MED FILL — Lidocaine HCl IV Inj 20 MG/ML: INTRAVENOUS | Qty: 5 | Status: AC

## 2016-11-13 MED FILL — Mannitol IV Soln 20%: INTRAVENOUS | Qty: 500 | Status: AC

## 2016-11-13 NOTE — Progress Notes (Signed)
Inpatient Diabetes Program Recommendations  AACE/ADA: New Consensus Statement on Inpatient Glycemic Control (2015)  Target Ranges:  Prepandial:   less than 140 mg/dL      Peak postprandial:   less than 180 mg/dL (1-2 hours)      Critically ill patients:  140 - 180 mg/dL   Lab Results  Component Value Date   GLUCAP 352 (H) 11/13/2016   HGBA1C 8.4 (H) 11/07/2016    Review of Glycemic Control  Diabetes history: DM2 Outpatient Diabetes medications: Basaglar 80 QHS + Novolog 18-24 TID + Metformin 500 BID + Victoza 1.8 QD Current orders for Inpatient glycemic control: IV insulin  Inpatient Diabetes Program Recommendations:  Noted hyperglycemia post IV insulin D/C.  Please consider when transitioning to SQ insulin: -50% home basal insulin dose-Lantus 40 -Novolog 5 units meal coverage if eats 50% and adjust as eating improves Will follow.  Thank you, Nani Gasser. Zerah Hilyer, RN, MSN, CDE  Diabetes Coordinator Inpatient Glycemic Control Team Team Pager [phone] (8am-5pm) 11/13/2016 8:40 AM

## 2016-11-13 NOTE — Progress Notes (Signed)
2 Days Post-Op Procedure(s) (LRB): CORONARY ARTERY BYPASS GRAFTING (CABG) x 3, USING LEFT MAMMARY ARTERY AND RIGHT GREATER SAPHENOUS VEIN HARVESTED ENDOSCOPICALLY (N/A) TRANSESOPHAGEAL ECHOCARDIOGRAM (TEE) (N/A) Subjective: Urgent CABG Morbidly obese diabetic Postoperative acute pulmonary failure requiring BiPAP for active adequate oxygenation Hemodynamics stable, mixed venous saturation 70% Maintaining sinus rhythm Currently on BiPAP saturation 93% Aggressive diuresis and pulmonary toilettein place  Objective: Vital signs in last 24 hours: Temp:  [97.6 F (36.4 C)-99.5 F (37.5 C)] 97.6 F (36.4 C) (06/28 0800) Pulse Rate:  [71-100] 86 (06/28 0820) Cardiac Rhythm: Bundle branch block;Normal sinus rhythm (06/28 0000) Resp:  [14-33] 18 (06/28 0820) BP: (79-153)/(41-71) 153/71 (06/28 0802) SpO2:  [79 %-99 %] 94 % (06/28 0820) Arterial Line BP: (76-177)/(37-68) 111/48 (06/28 0820) FiO2 (%):  [50 %-60 %] 55 % (06/28 0802) Weight:  [363 lb 6.4 oz (164.8 kg)] 363 lb 6.4 oz (164.8 kg) (06/28 0500)  Hemodynamic parameters for last 24 hours: PAP: (31-39)/(15-20) 35/15 CO:  [9 L/min] 9 L/min CI:  [3.3 L/min/m2] 3.3 L/min/m2  Intake/Output from previous day: 06/27 0701 - 06/28 0700 In: 2309.8 [I.V.:1859.8; IV Piggyback:450] Out: 6834 [HDQQI:2979; Chest Tube:160] Intake/Output this shift: Total I/O In: -  Out: 495 [Urine:495]       Exam    General- alert and comfortable on BiPAP   Lungs- clear but distant without rales, wheezes   Cor- regular rate and rhythm, no murmur , gallop   Abdomen- soft, non-tender   Extremities - warm, non-tender, minimal edema   Neuro- oriented, appropriate, no focal weakness   Lab Results:  Recent Labs  11/12/16 1536 11/12/16 1611 11/13/16 0439  WBC 19.2*  --  12.4*  HGB 11.3* 11.2* 10.2*  HCT 34.1* 33.0* 30.9*  PLT 157  --  92*   BMET:  Recent Labs  11/12/16 0419  11/12/16 1611 11/13/16 0439  NA 137  --  135 131*  K 4.2  --  4.4  4.5  CL 105  --  99* 99*  CO2 27  --   --  21*  GLUCOSE 118*  --  183* 350*  BUN 18  --  24* 29*  CREATININE 1.25*  < > 1.50* 1.82*  CALCIUM 8.3*  --   --  8.0*  < > = values in this interval not displayed.  PT/INR:  Recent Labs  11/11/16 1456  LABPROT 15.9*  INR 1.26   ABG    Component Value Date/Time   PHART 7.338 (L) 11/13/2016 0410   HCO3 21.0 11/13/2016 0410   TCO2 23 11/12/2016 1830   ACIDBASEDEF 3.8 (H) 11/13/2016 0410   O2SAT 74.4 11/13/2016 0436   CBG (last 3)   Recent Labs  11/13/16 0658 11/13/16 0751 11/13/16 0918  GLUCAP 317* 352* 254*    Assessment/Plan: S/P Procedure(s) (LRB): CORONARY ARTERY BYPASS GRAFTING (CABG) x 3, USING LEFT MAMMARY ARTERY AND RIGHT GREATER SAPHENOUS VEIN HARVESTED ENDOSCOPICALLY (N/A) TRANSESOPHAGEAL ECHOCARDIOGRAM (TEE) (N/A) Mobilize Diuresis Keep nothing by mouth due to tenuous pulmonary status Remove chest tubes Keep A-line to monitor ABGs Transition from IV insulin drip to twice a day Lantus 50 units did  LOS: 2 days    Kelly [name] 11/13/2016

## 2016-11-13 NOTE — Op Note (Signed)
NAME:  Kelly [name], Kelly [name]              ACCOUNT NO.:  [id number]  MEDICAL RECORD NO.:  [id number]  LOCATION:  [facility]                         FACILITY:  [facility]  PHYSICIAN:  Ivin Poot, M.D.  DATE OF BIRTH:  28-Mar-1956  DATE OF PROCEDURE:  11/11/2016 DATE OF DISCHARGE:                              OPERATIVE REPORT   OPERATION: 1. Coronary artery bypass grafting x3 (left IMA to LAD, saphenous vein     graft to OM2, saphenous vein graft to posterior descending). 2. Endoscopic harvest of right leg greater saphenous vein.  PREOPERATIVE DIAGNOSES:  Severe 3-vessel coronary artery disease, class III progressive angina, diabetes mellitus.  POSTOPERATIVE DIAGNOSES:  Severe 3-vessel coronary artery disease, class III progressive angina, diabetes mellitus.  SURGEON:  Ivin Poot, M.D.  ASSISTANT:  Quincy Simmonds, PA-C.  ANESTHESIA:  General.  CLINICAL NOTE:  The patient is a 61 year old morbidly obese, 350-pound male, diabetic, with exertional chest pain and decreasing exercise tolerance.  A stress test was at risk and cardiac catheterization demonstrated severe diabetic 3-vessel coronary artery disease with mild LV dysfunction.  Surgical coronary revascularization was recommended by his cardiologist.  I examined the patient in the office and reviewed the results of the cardiac arteriogram images, which I had personally reviewed and discussed with the patient.  I told the patient that surgical coronary revascularization would be the best treatment for a diabetic patient with severe 3-vessel coronary disease for improved survival and preservation of LV function; however, because of his morbid obesity, he would be at increased risk for both pulmonary complications and wound infection.  He wished to proceed with surgery under increased risk.  He underwent preoperative testing, which showed adequate PFTs. Echocardiogram showed adequate LV function, and renal function was only mildly  abnormal.  The patient understood the risks of pulmonary problems including pleural effusion and prolonged ventilation, wound infection, bleeding requiring transfusion, stroke, and death.  OPERATIVE PROCEDURE:  The patient was brought to the operating room and placed supine on the operating table.  General anesthesia was induced. The patient was positioned on the operating table with some difficulty because of his large size and bed extension devices were needed.  The patient was prepped and draped as a sterile field.  A transesophageal echo probe was placed by the Anesthesia team.  A proper time-out was performed.  A sternal incision was made.  The vein was harvested endoscopically from the right leg.  The left internal mammary artery was harvested as a pedicle graft from its origin at the subclavian vessels. This was difficult due to the patient's obese body habitus and thick deep chest.  The saphenous vein was of adequate quality and length.  The sternal retractor was placed using the deep blades.  The pericardium was opened and suspended.  Pursestrings were placed in ascending aorta and right atrium, and the patient was cannulated and placed on cardiopulmonary bypass after the ACT was documented as being therapeutic.  The coronaries were identified for grafting.  The coronaries were diffusely diseased and were graftable with suboptimal. The mammary artery and vein grafts were prepared for the distal anastomoses and cardioplegia cannulas were placed with both antegrade and retrograde cold blood cardioplegia.  The patient was cooled to 32 degrees and an aortic crossclamp was applied.  One liter of cold blood cardioplegia was delivered in split doses between the antegrade aortic and retrograde coronary sinus catheters.  There was good cardioplegic arrest and septal temperature dropped less than 14 degrees. Cardioplegia was delivered every 20 minutes.  The distal coronary anastomoses  were performed.  The first distal anastomosis was to the posterior descending.  This had a proximal 90% stenosis.  A reverse saphenous vein was sewn end-to-side with running 7- 0 Prolene with good flow through the graft.  Cardioplegia was redosed.  The second distal anastomosis was the OM2 branch of the circumflex. This had a proximal high-grade stenosis.  A reverse saphenous vein was sewn end-to-side with running 7-0 Prolene with good flow through the graft.  Cardioplegia was redosed.  The third distal anastomosis was to the mid to distal third of the LAD. It had a high-grade proximal stenosis, greater than 80% to 90%.  The left IMA pedicle was brought through an opening, and the left lateral pericardium was brought down onto the LAD and sewn end-to-side with running 8-0 Prolene.  There was good flow through the anastomosis after briefly releasing the pedicle bulldog on the mammary artery pedicle. The bulldog was reapplied, and the pedicle was secured to epicardium. The Cardioplegia was redosed.  With the crossclamp still in place, 2 proximal vein anastomoses were performed on the ascending aorta using a 4.5-mm punch running 6-0 Prolene.  Prior to tying down the final proximal anastomosis, the air was vented from the coronaries with a dose of retrograde warm blood cardioplegia.  The crossclamp was removed.  The heart was rewarmed and reperfused.  The vein grafts were de-aired and opened and each had good flow.  Hemostasis was documented at the proximal and distal anastomoses.  Temporary pacing wires were applied. The lungs were expanded.  The ventilator was resumed.  The patient was placed on low-dose milrinone for elevated pulmonary pressures and then weaned from cardiopulmonary bypass without difficulty.  Echo showed a preserved LV systolic function.  Protamine was administered without adverse reaction.  The cannulas were removed.  The mediastinum was irrigated.  There was  adequate hemostasis.  The superior pericardial fat was closed over the aorta.  Anterior mediastinal and left pleural chest tubes were placed and brought out through separate incisions.  The sternum was closed with wire.  The pectoralis fascia was closed with a running #1 Vicryl.  The subcutaneous and skin layers were closed with running Vicryl, and sterile dressings were applied.  Total cardiopulmonary bypass time was 135 minutes.     Ivin Poot, M.D.     PV/MEDQ  D:  11/13/2016  T:  11/13/2016  Job:  [id number]

## 2016-11-13 NOTE — Anesthesia Postprocedure Evaluation (Signed)
Anesthesia Post Note  Patient: Kelly [name]  Procedure(s) Performed: Procedure(s) (LRB): CORONARY ARTERY BYPASS GRAFTING (CABG) x 3, USING LEFT MAMMARY ARTERY AND RIGHT GREATER SAPHENOUS VEIN HARVESTED ENDOSCOPICALLY (N/A) TRANSESOPHAGEAL ECHOCARDIOGRAM (TEE) (N/A)     Patient location during evaluation: SICU Anesthesia Type: General Level of consciousness: awake Pain management: pain level controlled Vital Signs Assessment: post-procedure vital signs reviewed and stable Respiratory status: spontaneous breathing Cardiovascular status: stable Anesthetic complications: no    Last Vitals:  Vitals:   11/13/16 0802 11/13/16 0820  BP: (!) 153/71   Pulse: 86 86  Resp: (!) 26 18  Temp:      Last Pain:  Vitals:   11/13/16 0900  TempSrc:   PainSc: [place]

## 2016-11-13 NOTE — Progress Notes (Signed)
TCTS BRIEF SICU PROGRESS NOTE  2 Days Post-Op  S/P Procedure(s) (LRB): CORONARY ARTERY BYPASS GRAFTING (CABG) x 3, USING LEFT MAMMARY ARTERY AND RIGHT GREATER SAPHENOUS VEIN HARVESTED ENDOSCOPICALLY (N/A) TRANSESOPHAGEAL ECHOCARDIOGRAM (TEE) (N/A)   Stable day NSR w/ stable BP off neo O2 sats 95% on NRB facemask Diuresing well  Plan: Continue current plan  Rexene Alberts, MD 11/13/2016 5:55 PM

## 2016-11-14 ENCOUNTER — Inpatient Hospital Stay (HOSPITAL_COMMUNITY): Payer: BC Managed Care – PPO

## 2016-11-14 LAB — GLUCOSE, CAPILLARY
Glucose-Capillary: 265 mg/dL — ABNORMAL HIGH (ref 65–99)
Glucose-Capillary: 244 mg/dL — ABNORMAL HIGH (ref 65–99)
Glucose-Capillary: 170 mg/dL — ABNORMAL HIGH (ref 65–99)
Glucose-Capillary: 241 mg/dL — ABNORMAL HIGH (ref 65–99)
Glucose-Capillary: 313 mg/dL — ABNORMAL HIGH (ref 65–99)
Glucose-Capillary: 221 mg/dL — ABNORMAL HIGH (ref 65–99)
Glucose-Capillary: 229 mg/dL — ABNORMAL HIGH (ref 65–99)

## 2016-11-14 LAB — CBC
HCT: 28.9 % — ABNORMAL LOW (ref 39.0–52.0)
Hemoglobin: 9.7 g/dL — ABNORMAL LOW (ref 13.0–17.0)
MCH: 32.1 pg (ref 26.0–34.0)
MCHC: 33.6 g/dL (ref 30.0–36.0)
MCV: 95.7 fL (ref 78.0–100.0)
Platelets: 93 10*3/uL — ABNORMAL LOW (ref 150–400)
RBC: 3.02 MIL/uL — ABNORMAL LOW (ref 4.22–5.81)
RDW: 12.4 % (ref 11.5–15.5)
WBC: 11.1 10*3/uL — ABNORMAL HIGH (ref 4.0–10.5)

## 2016-11-14 LAB — COMPREHENSIVE METABOLIC PANEL
ALT: 17 U/L (ref 17–63)
AST: 24 U/L (ref 15–41)
Albumin: 2.7 g/dL — ABNORMAL LOW (ref 3.5–5.0)
Alkaline Phosphatase: 48 U/L (ref 38–126)
Anion gap: 9 (ref 5–15)
BUN: 37 mg/dL — ABNORMAL HIGH (ref 6–20)
CO2: 24 mmol/L (ref 22–32)
Calcium: 8.2 mg/dL — ABNORMAL LOW (ref 8.9–10.3)
Chloride: 96 mmol/L — ABNORMAL LOW (ref 101–111)
Creatinine, Ser: 1.63 mg/dL — ABNORMAL HIGH (ref 0.61–1.24)
GFR calc Af Amer: 52 mL/min — ABNORMAL LOW (ref >60)
GFR calc non Af Amer: 45 mL/min — ABNORMAL LOW (ref >60)
Glucose, Bld: 276 mg/dL — ABNORMAL HIGH (ref 65–99)
Potassium: 4 mmol/L (ref 3.5–5.1)
Sodium: 129 mmol/L — ABNORMAL LOW (ref 135–145)
Total Bilirubin: 0.8 mg/dL (ref 0.3–1.2)
Total Protein: 6.2 g/dL — ABNORMAL LOW (ref 6.5–8.1)

## 2016-11-14 LAB — POCT I-STAT 3, ART BLOOD GAS (G3+)
Bicarbonate: 24.9 mmol/L (ref 20.0–28.0)
O2 Saturation: 92 %
Patient temperature: 98.7
TCO2: 26 mmol/L (ref 0–100)
pCO2 arterial: 39.7 mmHg (ref 32.0–48.0)
pH, Arterial: 7.407 (ref 7.350–7.450)
pO2, Arterial: 63 mmHg — ABNORMAL LOW (ref 83.0–108.0)

## 2016-11-14 LAB — POCT I-STAT, CHEM 8
BUN: 41 mg/dL — ABNORMAL HIGH (ref 6–20)
Calcium, Ion: 1.19 mmol/L (ref 1.15–1.40)
Chloride: 92 mmol/L — ABNORMAL LOW (ref 101–111)
Creatinine, Ser: 1.6 mg/dL — ABNORMAL HIGH (ref 0.61–1.24)
Glucose, Bld: 286 mg/dL — ABNORMAL HIGH (ref 65–99)
HCT: 26 % — ABNORMAL LOW (ref 39.0–52.0)
Hemoglobin: 8.8 g/dL — ABNORMAL LOW (ref 13.0–17.0)
Potassium: 4 mmol/L (ref 3.5–5.1)
Sodium: 132 mmol/L — ABNORMAL LOW (ref 135–145)
TCO2: 27 mmol/L (ref 0–100)

## 2016-11-14 LAB — COOXEMETRY PANEL
Carboxyhemoglobin: 0.9 % (ref 0.5–1.5)
Methemoglobin: 1.2 % (ref 0.0–1.5)
O2 Saturation: 56.4 %
Total hemoglobin: 10 g/dL — ABNORMAL LOW (ref 12.0–16.0)

## 2016-11-14 MED ORDER — INSULIN ASPART 100 UNIT/ML ~~LOC~~ SOLN
0.0000 [IU] | SUBCUTANEOUS | Status: DC
  Administered 2016-11-14 (×2): 7 [IU] via SUBCUTANEOUS
  Administered 2016-11-14: 15 [IU] via SUBCUTANEOUS
  Administered 2016-11-14 – 2016-11-15 (×3): 7 [IU] via SUBCUTANEOUS
  Administered 2016-11-15: 11 [IU] via SUBCUTANEOUS
  Administered 2016-11-15 (×2): 7 [IU] via SUBCUTANEOUS
  Administered 2016-11-15 – 2016-11-16 (×3): 11 [IU] via SUBCUTANEOUS
  Administered 2016-11-16: 4 [IU] via SUBCUTANEOUS
  Administered 2016-11-16: 11 [IU] via SUBCUTANEOUS
  Administered 2016-11-16: 7 [IU] via SUBCUTANEOUS
  Administered 2016-11-16: 11 [IU] via SUBCUTANEOUS
  Administered 2016-11-17 (×2): 4 [IU] via SUBCUTANEOUS
  Administered 2016-11-17: 3 [IU] via SUBCUTANEOUS
  Administered 2016-11-17: 1 [IU] via SUBCUTANEOUS
  Administered 2016-11-17: 4 [IU] via SUBCUTANEOUS
  Administered 2016-11-18: 3 [IU] via SUBCUTANEOUS

## 2016-11-14 MED ORDER — PIPERACILLIN-TAZOBACTAM 3.375 G IVPB
3.3750 g | Freq: Three times a day (TID) | INTRAVENOUS | Status: DC
  Administered 2016-11-14 – 2016-11-20 (×18): 3.375 g via INTRAVENOUS
  Filled 2016-11-14 (×21): qty 50

## 2016-11-14 MED ORDER — INSULIN GLARGINE 100 UNIT/ML ~~LOC~~ SOLN
60.0000 [IU] | Freq: Two times a day (BID) | SUBCUTANEOUS | Status: DC
  Administered 2016-11-14 – 2016-11-16 (×6): 60 [IU] via SUBCUTANEOUS
  Filled 2016-11-14 (×7): qty 0.6

## 2016-11-14 MED ORDER — LEVALBUTEROL HCL 0.63 MG/3ML IN NEBU
0.6300 mg | INHALATION_SOLUTION | RESPIRATORY_TRACT | Status: DC | PRN
  Administered 2016-11-14 – 2016-11-16 (×3): 0.63 mg via RESPIRATORY_TRACT
  Filled 2016-11-14 (×3): qty 3

## 2016-11-14 MED ORDER — CHLORHEXIDINE GLUCONATE CLOTH 2 % EX PADS
6.0000 | MEDICATED_PAD | Freq: Every day | CUTANEOUS | Status: DC
  Administered 2016-11-14 – 2016-11-18 (×5): 6 via TOPICAL

## 2016-11-14 MED ORDER — PANTOPRAZOLE SODIUM 40 MG IV SOLR
40.0000 mg | INTRAVENOUS | Status: DC
  Administered 2016-11-14 – 2016-11-15 (×2): 40 mg via INTRAVENOUS
  Filled 2016-11-14 (×2): qty 40

## 2016-11-14 MED ORDER — INSULIN ASPART 100 UNIT/ML ~~LOC~~ SOLN
10.0000 [IU] | Freq: Three times a day (TID) | SUBCUTANEOUS | Status: DC
  Administered 2016-11-14 – 2016-11-16 (×3): 10 [IU] via SUBCUTANEOUS

## 2016-11-14 MED ORDER — SODIUM CHLORIDE 0.9% FLUSH
10.0000 mL | Freq: Two times a day (BID) | INTRAVENOUS | Status: DC
  Administered 2016-11-14: 10 mL
  Administered 2016-11-14 – 2016-11-15 (×2): 20 mL
  Administered 2016-11-16: 10 mL

## 2016-11-14 MED ORDER — SODIUM CHLORIDE 0.9% FLUSH: 10.0000 mL | INTRAVENOUS | Status: DC | PRN

## 2016-11-14 NOTE — Progress Notes (Signed)
Inpatient Diabetes Program Recommendations  AACE/ADA: New Consensus Statement on Inpatient Glycemic Control (2015)  Target Ranges:  Prepandial:   less than 140 mg/dL      Peak postprandial:   less than 180 mg/dL (1-2 hours)      Critically ill patients:  140 - 180 mg/dL   Results for Kelly [name], Kelly [name] (MRN [id number]) as of 11/14/2016 08:48  Ref. Range 11/12/2016 23:38 11/13/2016 03:39 11/13/2016 04:35 11/13/2016 05:53 11/13/2016 06:58 11/13/2016 07:51 11/13/2016 09:18 11/13/2016 10:01 11/13/2016 11:07 11/13/2016 11:45 11/13/2016 12:10 11/13/2016 14:08 11/13/2016 16:00 11/13/2016 16:18 11/13/2016 20:10 11/13/2016 21:22  Glucose-Capillary Latest Ref Range: 65 - 99 mg/dL 303 (H) 350 (H) 316 (H) 307 (H) 317 (H) 352 (H) 254 (H) 232 (H) 285 (H) 284 (H) 278 (H) 282 (H) 312 (H) 315 (H) 307 (H) 312 (H)   Results for Kelly [name], Kelly [name] (MRN [id number]) as of 11/14/2016 08:48  Ref. Range 11/13/2016 23:32 11/14/2016 01:18 11/14/2016 03:27 11/14/2016 05:05 11/14/2016 07:57  Glucose-Capillary Latest Ref Range: 65 - 99 mg/dL 284 (H) 265 (H) 244 (H) 170 (H) 241 (H)    Home DM Meds: Basaglar (insulin glargine) 80 units QHS       Novolog 18-24 units TID       Victoza 1.8 mg daily       Metformin 500 mg BID    Current Insulin Orders: Lantus 60 units BID      Novolog Resistant Correction Scale/ SSI (0-20 units) Q4 hours      MD- Note Lantus increased to 60 units BID today (was 50 units BID yesterday) and Novolog increased to Resistant scale (0-20 units) Q4 hours.  Patient started on CL diet this AM.  MD- Once patient starts solid PO diet, may consider starting Novolog Meal Coverage:  Novolog 10 units TID with meals (hold if pt eats <50% of meal)     --Will follow patient during hospitalization--  Wyn Quaker RN, MSN, CDE Diabetes Coordinator Inpatient Glycemic Control Team Team Pager: [phone] (8a-5p)

## 2016-11-14 NOTE — Progress Notes (Signed)
Pharmacy Antibiotic Note  Kelly [name] is a 61 y.o. male admitted on 11/11/2016 with purulence at surgical site.  Pharmacy has been consulted for Zosyn dosing.  Plan: Zosyn 3.375g IV q8h (4 hour infusion).  Will monitor renal function, cultures and clinical course.   Weight: (!) 353 lb 13.4 oz (160.5 kg)  Temp (24hrs), Avg:98.4 F (36.9 C), Min:97.5 F (36.4 C), Max:99 F (37.2 C)   Recent Labs Lab 11/11/16 2233  11/12/16 0419 11/12/16 1536 11/12/16 1611 11/13/16 0439 11/13/16 1603 11/14/16 0313  WBC 11.6*  --  12.1* 19.2*  --  12.4*  --  11.1*  CREATININE 1.31*  < > 1.25* 1.72* 1.50* 1.82* 1.50* 1.63*  < > = values in this interval not displayed.  Estimated Creatinine Clearance: 79.3 mL/min (A) (by C-G formula based on SCr of 1.63 mg/dL (H)).    Allergies  Allergen Reactions  . No Known Allergies     Antimicrobials this admission: Zosyn 6/29 >  Dose adjustments this admission:   Microbiology results:  BCx:   UCx:    Sputum:    MRSA PCR:   Thank you for allowing pharmacy to be a part of this patient's care.  Uvaldo Rising, BCPS  Clinical Pharmacist Pager [phone]  11/14/2016 1:01 PM

## 2016-11-14 NOTE — Progress Notes (Signed)
3 Days Post-Op Procedure(s) (LRB): CORONARY ARTERY BYPASS GRAFTING (CABG) x 3, USING LEFT MAMMARY ARTERY AND RIGHT GREATER SAPHENOUS VEIN HARVESTED ENDOSCOPICALLY (N/A) TRANSESOPHAGEAL ECHOCARDIOGRAM (TEE) (N/A) Subjective: O2 sats better off BiPap Lost 4 lbs wt Now in afib on iv amio Needs DM management consult Objective: Vital signs in last 24 hours: Temp:  [97.5 F (36.4 C)-99 F (37.2 C)] 98.6 F (37 C) (06/29 0755) Pulse Rate:  [29-106] 82 (06/29 0700) Cardiac Rhythm: Atrial fibrillation (06/29 0400) Resp:  [18-31] 18 (06/29 0700) BP: (99-151)/(53-83) 118/70 (06/29 0700) SpO2:  [91 %-99 %] 96 % (06/29 0700) Arterial Line BP: (102-164)/(39-61) 116/49 (06/29 0700) FiO2 (%):  [50 %-60 %] 60 % (06/29 0400) Weight:  [353 lb 13.4 oz (160.5 kg)] 353 lb 13.4 oz (160.5 kg) (06/29 0500)  Hemodynamic parameters for last 24 hours:  stable BP of neo  Intake/Output from previous day: 06/28 0701 - 06/29 0700 In: 1323.6 [P.O.:360; I.V.:963.6] Out: 2760 [Urine:2760] Intake/Output this shift: No intake/output data recorded.       Exam    General- alert and comfortable   Lungs- clear without rales, wheezes   Cor- regular rate and rhythm, no murmur , gallop   Abdomen- soft, non-tender   Extremities - warm, non-tender, minimal edema   Neuro- oriented, appropriate, no focal weakness   Lab Results:  Recent Labs  11/13/16 0439 11/13/16 1603 11/14/16 0313  WBC 12.4*  --  11.1*  HGB 10.2* 9.5* 9.7*  HCT 30.9* 28.0* 28.9*  PLT 92*  --  93*   BMET:  Recent Labs  11/13/16 0439 11/13/16 1603 11/14/16 0313  NA 131* 132* 129*  K 4.5 4.2 4.0  CL 99* 95* 96*  CO2 21*  --  24  GLUCOSE 350* 328* 276*  BUN 29* 34* 37*  CREATININE 1.82* 1.50* 1.63*  CALCIUM 8.0*  --  8.2*    PT/INR:  Recent Labs  11/11/16 1456  LABPROT 15.9*  INR 1.26   ABG    Component Value Date/Time   PHART 7.407 11/14/2016 0334   HCO3 24.9 11/14/2016 0334   TCO2 26 11/14/2016 0334   ACIDBASEDEF  3.0 (H) 11/13/2016 1600   O2SAT 56.4 11/14/2016 0338   CBG (last 3)   Recent Labs  11/14/16 0327 11/14/16 0505 11/14/16 0757  GLUCAP 244* 170* 241*    Assessment/Plan: S/P Procedure(s) (LRB): CORONARY ARTERY BYPASS GRAFTING (CABG) x 3, USING LEFT MAMMARY ARTERY AND RIGHT GREATER SAPHENOUS VEIN HARVESTED ENDOSCOPICALLY (N/A) TRANSESOPHAGEAL ECHOCARDIOGRAM (TEE) (N/A) Mobilize Diuresis Diabetes control DC a-line Swallow eval and start diet as recommended  LOS: 3 days    Kelly [name] 11/14/2016

## 2016-11-14 NOTE — Evaluation (Signed)
Physical Therapy Evaluation Patient Details Name: Kelly [name] MRN: [id number] DOB: 11-29-1955 Today's Date: 11/14/2016   History of Present Illness  Pt admit for CABG x 3.  PMH:  anxiety, CAD, DM, HTN, MI, lupus, prior stents  Clinical Impression  Pt admitted with above diagnosis. Pt currently with functional limitations due to the deficits listed below (see PT Problem List). Pt was able to ambulate with EVa walker with +3 assist - mod assist overall with chair follow.  Pt with DOE 4/4 on NRB mask at 100% O2.  Pt will most likely need SNF at d/c to reach desired functional level.  Pt and wife agreeable and wife prefers [employer].  Will follow acutely. Pt will benefit from skilled PT to increase their independence and safety with mobility to allow discharge to the venue listed below.      Follow Up Recommendations SNF;Supervision/Assistance - 24 hour    Equipment Recommendations  Other (comment) (TBA)    Recommendations for Other Services       Precautions / Restrictions Precautions Precautions: Fall;Sternal Restrictions Weight Bearing Restrictions: No      Mobility  Bed Mobility Overal bed mobility: Needs Assistance Bed Mobility: Sit to Supine       Sit to supine: Max assist   General bed mobility comments: Needed max assist to get LEs into bed and cues to hug pillow  Transfers Overall transfer level: Needs assistance Equipment used: None (held pillow) Transfers: Sit to/from Stand Sit to Stand: Min assist;Mod assist;+2 physical assistance         General transfer comment: Pt hugged heart pillow and stood with mod assist and use of pad to boost hips as pt in low recliner  Ambulation/Gait Ambulation/Gait assistance: Min assist;+2 safety/equipment Ambulation Distance (Feet): 35 Feet Assistive device:  (Eva walker) Gait Pattern/deviations: Step-through pattern;Decreased stride length;Shuffle;Antalgic;Drifts right/left;Wide base of support   Gait velocity  interpretation: Below normal speed for age/gender General Gait Details: Pt used EVa walker and did walk out to hallway and back to bed with chair follow.  Pt needed assist to control Eva walker and took shuffle steps. Needed assist to steer EVa walker as well. Was on NRB mask at 100% with VSS. Pt DOE 4/4 at end of walk.   Stairs            Wheelchair Mobility    Modified Rankin (Stroke Patients Only)       Balance Overall balance assessment: Needs assistance;History of Falls Sitting-balance support: No upper extremity supported;Feet supported Sitting balance-Leahy Scale: Fair     Standing balance support: Bilateral upper extremity supported;During functional activity Standing balance-Leahy Scale: Poor Standing balance comment: relies on UE support for balanc.e              High level balance activites: Direction changes;Turns;Sudden stops High Level Balance Comments: Needed mod assist for challenges.             Pertinent Vitals/Pain Pain Assessment: Faces Faces Pain Scale: Hurts even more Pain Location: generalized Pain Descriptors / Indicators: Grimacing;Guarding;Sore Pain Intervention(s): Limited activity within patient's tolerance;Monitored during session;Repositioned  100% O2 on NRB mask, HR 96-110 bpm, BP stable  Home Living Family/patient expects to be discharged to:: Private residence Living Arrangements: Spouse/significant other Available Help at Discharge: Family;Available 24 hours/day Type of Home: House Home Access: Stairs to enter;Ramped entrance Entrance Stairs-Rails: Left Entrance Stairs-Number of Steps: flight Home Layout: Full bath on main level;Able to live on main level with bedroom/bathroom;Two level Home Equipment: None Additional  Comments: 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    Prior Function Level of Independence: Independent               Journalist, newspaper        Extremity/Trunk Assessment    Upper Extremity Assessment Upper Extremity Assessment: Defer to OT evaluation    Lower Extremity Assessment Lower Extremity Assessment: Generalized weakness    Cervical / Trunk Assessment Cervical / Trunk Assessment: Normal  Communication   Communication: No difficulties  Cognition Arousal/Alertness: Lethargic;Suspect due to medications Behavior During Therapy: Flat affect Overall Cognitive Status: Within Functional Limits for tasks assessed                                        General Comments General comments (skin integrity, edema, etc.): Some oozing at incision noted upon return to bed.  Nurse aware and addressed.     Exercises     Assessment/Plan    PT Assessment Patient needs continued PT services  PT Problem List Decreased activity tolerance;Decreased balance;Decreased mobility;Decreased coordination;Decreased knowledge of use of DME;Decreased safety awareness;Decreased knowledge of precautions;Cardiopulmonary status limiting activity;Pain;Obesity       PT Treatment Interventions DME instruction;Gait training;Functional mobility training;Therapeutic exercise;Therapeutic activities;Balance training;Patient/family education;Stair training    PT Goals (Current goals can be found in the Care Plan section)  Acute Rehab PT Goals Patient Stated Goal: to get better PT Goal Formulation: With patient Time For Goal Achievement: 11/28/16 Potential to Achieve Goals: Good    Frequency Min 3X/week   Barriers to discharge        Co-evaluation               AM-PAC PT "6 Clicks" Daily Activity  Outcome Measure Difficulty turning over in bed (including adjusting bedclothes, sheets and blankets)?: Total Difficulty moving from lying on back to sitting on the side of the bed? : Total Difficulty sitting down on and standing up from a chair with arms (e.g., wheelchair, bedside commode, etc,.)?: Total Help needed moving to and from a bed to chair (including  a wheelchair)?: Total Help needed walking in hospital room?: Total Help needed climbing 3-5 steps with a railing? : Total 6 Click Score: 6    End of Session Equipment Utilized During Treatment: Gait belt;Oxygen Activity Tolerance: Patient limited by fatigue Patient left: in bed;with call bell/phone within reach;with family/visitor present Nurse Communication: Mobility status PT Visit Diagnosis: Muscle weakness (generalized) (M62.81);Unsteadiness on feet (R26.81);Pain Pain - part of body:  (sternum)    Time: 8891-6945 PT Time Calculation (min) (ACUTE ONLY): 23 min   Charges:   PT Evaluation $PT Eval Moderate Complexity: 1 Procedure PT Treatments $Gait Training: 8-22 mins   PT G Codes:        Trask Vosler,PT Acute Rehabilitation [phone]   [name] 11/14/2016, 2:03 PM

## 2016-11-14 NOTE — Progress Notes (Signed)
Patient went into rapid A Fib with HR in the 130s-140s. Roxy Manns MD paged and orders received to initiate amiodarone infusion with a bolus. Will carry out orders and continue to monitor the patient closely.

## 2016-11-15 ENCOUNTER — Inpatient Hospital Stay (HOSPITAL_COMMUNITY): Payer: BC Managed Care – PPO

## 2016-11-15 LAB — CBC
HCT: 28.3 % — ABNORMAL LOW (ref 39.0–52.0)
Hemoglobin: 9.4 g/dL — ABNORMAL LOW (ref 13.0–17.0)
MCH: 32.5 pg (ref 26.0–34.0)
MCHC: 33.2 g/dL (ref 30.0–36.0)
MCV: 97.9 fL (ref 78.0–100.0)
Platelets: 109 10*3/uL — ABNORMAL LOW (ref 150–400)
RBC: 2.89 MIL/uL — ABNORMAL LOW (ref 4.22–5.81)
RDW: 12.8 % (ref 11.5–15.5)
WBC: 8.8 10*3/uL (ref 4.0–10.5)

## 2016-11-15 LAB — GLUCOSE, CAPILLARY
Glucose-Capillary: 210 mg/dL — ABNORMAL HIGH (ref 65–99)
Glucose-Capillary: 218 mg/dL — ABNORMAL HIGH (ref 65–99)
Glucose-Capillary: 245 mg/dL — ABNORMAL HIGH (ref 65–99)
Glucose-Capillary: 248 mg/dL — ABNORMAL HIGH (ref 65–99)
Glucose-Capillary: 264 mg/dL — ABNORMAL HIGH (ref 65–99)
Glucose-Capillary: 268 mg/dL — ABNORMAL HIGH (ref 65–99)
Glucose-Capillary: 272 mg/dL — ABNORMAL HIGH (ref 65–99)

## 2016-11-15 LAB — COMPREHENSIVE METABOLIC PANEL
ALT: 14 U/L — ABNORMAL LOW (ref 17–63)
AST: 21 U/L (ref 15–41)
Albumin: 2.6 g/dL — ABNORMAL LOW (ref 3.5–5.0)
Alkaline Phosphatase: 49 U/L (ref 38–126)
Anion gap: 9 (ref 5–15)
BUN: 44 mg/dL — ABNORMAL HIGH (ref 6–20)
CO2: 27 mmol/L (ref 22–32)
Calcium: 8.4 mg/dL — ABNORMAL LOW (ref 8.9–10.3)
Chloride: 96 mmol/L — ABNORMAL LOW (ref 101–111)
Creatinine, Ser: 1.62 mg/dL — ABNORMAL HIGH (ref 0.61–1.24)
GFR calc Af Amer: 52 mL/min — ABNORMAL LOW (ref >60)
GFR calc non Af Amer: 45 mL/min — ABNORMAL LOW (ref >60)
Glucose, Bld: 217 mg/dL — ABNORMAL HIGH (ref 65–99)
Potassium: 3.6 mmol/L (ref 3.5–5.1)
Sodium: 132 mmol/L — ABNORMAL LOW (ref 135–145)
Total Bilirubin: 0.7 mg/dL (ref 0.3–1.2)
Total Protein: 6.1 g/dL — ABNORMAL LOW (ref 6.5–8.1)

## 2016-11-15 LAB — BASIC METABOLIC PANEL
Anion gap: 11 (ref 5–15)
BUN: 43 mg/dL — ABNORMAL HIGH (ref 6–20)
CO2: 26 mmol/L (ref 22–32)
Calcium: 8.1 mg/dL — ABNORMAL LOW (ref 8.9–10.3)
Chloride: 90 mmol/L — ABNORMAL LOW (ref 101–111)
Creatinine, Ser: 1.72 mg/dL — ABNORMAL HIGH (ref 0.61–1.24)
GFR calc Af Amer: 48 mL/min — ABNORMAL LOW (ref >60)
GFR calc non Af Amer: 42 mL/min — ABNORMAL LOW (ref >60)
Glucose, Bld: 287 mg/dL — ABNORMAL HIGH (ref 65–99)
Potassium: 3.5 mmol/L (ref 3.5–5.1)
Sodium: 127 mmol/L — ABNORMAL LOW (ref 135–145)

## 2016-11-15 LAB — COOXEMETRY PANEL
Carboxyhemoglobin: 1.1 % (ref 0.5–1.5)
Methemoglobin: 1.3 % (ref 0.0–1.5)
O2 Saturation: 63.7 %
Total hemoglobin: 9.6 g/dL — ABNORMAL LOW (ref 12.0–16.0)

## 2016-11-15 MED ORDER — POTASSIUM CHLORIDE 10 MEQ/50ML IV SOLN
10.0000 meq | INTRAVENOUS | Status: DC | PRN
  Filled 2016-11-15: qty 50

## 2016-11-15 MED ORDER — POTASSIUM CHLORIDE CRYS ER 20 MEQ PO TBCR
20.0000 meq | EXTENDED_RELEASE_TABLET | ORAL | Status: AC | PRN
  Administered 2016-11-15 – 2016-11-16 (×3): 20 meq via ORAL
  Filled 2016-11-15 (×3): qty 1

## 2016-11-15 MED ORDER — CHLORHEXIDINE GLUCONATE CLOTH 2 % EX PADS: 6.0000 | MEDICATED_PAD | Freq: Every day | CUTANEOUS | Status: DC

## 2016-11-15 MED ORDER — SODIUM CHLORIDE 0.9% FLUSH
10.0000 mL | Freq: Two times a day (BID) | INTRAVENOUS | Status: DC
  Administered 2016-11-16 – 2016-11-21 (×8): 10 mL

## 2016-11-15 MED ORDER — SODIUM CHLORIDE 0.9% FLUSH
10.0000 mL | INTRAVENOUS | Status: DC | PRN
  Administered 2016-11-20: 10 mL
  Filled 2016-11-15: qty 40

## 2016-11-15 NOTE — Progress Notes (Signed)
Peripherally Inserted Central Catheter/Midline Placement  The IV Nurse has discussed with the patient and/or persons authorized to consent for the patient, the purpose of this procedure and the potential benefits and risks involved with this procedure.  The benefits include less needle sticks, lab draws from the catheter, and the patient may be discharged home with the catheter. Risks include, but not limited to, infection, bleeding, blood clot (thrombus formation), and puncture of an artery; nerve damage and irregular heartbeat and possibility to perform a PICC exchange if needed/ordered by physician.  Alternatives to this procedure were also discussed.  Bard Power PICC patient education guide, fact sheet on infection prevention and patient information card has been provided to patient /or left at bedside.    PICC/Midline Placement Documentation  PICC Double Lumen 24/49/75 PICC Right Basilic 55 cm 0 cm (Active)  Indication for Insertion or Continuance of Line Prolonged intravenous therapies 11/15/2016  9:00 AM  Exposed Catheter (cm) 0 cm 11/15/2016  9:00 AM  Site Assessment Clean;Dry;Intact 11/15/2016  9:00 AM  Lumen #1 Status Flushed;Saline locked;Blood return noted 11/15/2016  9:00 AM  Lumen #2 Status Flushed;Saline locked;Blood return noted 11/15/2016  9:00 AM  Dressing Type Transparent;Securing device 11/15/2016  9:00 AM  Dressing Status Clean;Dry;Intact;Antimicrobial disc in place 11/15/2016  9:00 AM  Dressing Change Due 11/22/16 11/15/2016  9:00 AM       Kelly [name] 11/15/2016, 9:15 AM

## 2016-11-15 NOTE — Progress Notes (Signed)
SLP Cancellation Note  Patient Details Name: Kelly [name] MRN: [id number] DOB: May 03, 1956   Cancelled treatment:       Reason Eval/Treat Not Completed: Patient at procedure or test/unavailable: SLP to follow up for Bedside Swallow Evaluation  Arvil Chaco MA, [place] 11/15/2016, 8:49 AM

## 2016-11-15 NOTE — Progress Notes (Signed)
Patient taken off of venturi mask and placed on 12L salter high flow nasal cannula.  Sats currently at 100%.  Vitals are stable.  Will continue to monitor.

## 2016-11-15 NOTE — Progress Notes (Signed)
4 Days Post-Op Procedure(s) (LRB): CORONARY ARTERY BYPASS GRAFTING (CABG) x 3, USING LEFT MAMMARY ARTERY AND RIGHT GREATER SAPHENOUS VEIN HARVESTED ENDOSCOPICALLY (N/A) TRANSESOPHAGEAL ECHOCARDIOGRAM (TEE) (N/A) Subjective:  Wheezing and some shortness of breath this am.  Objective: Vital signs in last 24 hours: Temp:  [97.5 F (36.4 C)-98.4 F (36.9 C)] 98.1 F (36.7 C) (06/30 1313) Pulse Rate:  [49-119] 94 (06/30 1300) Cardiac Rhythm: Atrial fibrillation (06/30 0900) Resp:  [12-25] 13 (06/30 1300) BP: (88-125)/(47-98) 119/81 (06/30 1300) SpO2:  [91 %-100 %] 96 % (06/30 1300) FiO2 (%):  [40 %-55 %] 40 % (06/30 1141) Weight:  [160.6 kg (354 lb 0.9 oz)] 160.6 kg (354 lb 0.9 oz) (06/30 0600)  Hemodynamic parameters for last 24 hours:    Intake/Output from previous day: 06/29 0701 - 06/30 0700 In: 1462.9 [P.O.:640; I.V.:672.9; IV Piggyback:150] Out: 2615 [Urine:2615] Intake/Output this shift: Total I/O In: 400.8 [P.O.:240; I.V.:160.8] Out: 850 [Urine:850]  General appearance: alert and cooperative Neurologic: intact Heart: irregularly irregular rhythm Lungs: wheezes bilaterally Extremities: edema moderate Wound: dressings dry  Lab Results:  Recent Labs  11/14/16 0313 11/14/16 1550 11/15/16 0350  WBC 11.1*  --  8.8  HGB 9.7* 8.8* 9.4*  HCT 28.9* 26.0* 28.3*  PLT 93*  --  109*   BMET:  Recent Labs  11/14/16 0313 11/14/16 1550 11/15/16 0350  NA 129* 132* 132*  K 4.0 4.0 3.6  CL 96* 92* 96*  CO2 24  --  27  GLUCOSE 276* 286* 217*  BUN 37* 41* 44*  CREATININE 1.63* 1.60* 1.62*  CALCIUM 8.2*  --  8.4*    PT/INR: No results for input(s): LABPROT, INR in the last 72 hours. ABG    Component Value Date/Time   PHART 7.407 11/14/2016 0334   HCO3 24.9 11/14/2016 0334   TCO2 27 11/14/2016 1550   ACIDBASEDEF 3.0 (H) 11/13/2016 1600   O2SAT 63.7 11/15/2016 0350   CBG (last 3)   Recent Labs  11/15/16 0353 11/15/16 0747 11/15/16 1309  GLUCAP 210* 245*  248*   CLINICAL DATA:  61 year old male status post PICC placement  EXAM: PORTABLE CHEST 1 VIEW  COMPARISON:  Prior chest x-ray obtained earlier today at 5:45 a.m.  FINDINGS: New right upper extremity approach PICC. The catheter tip terminates at the superior cavoatrial junction. A right IJ Cordis sheath in stable position. The tip overlies the junction of the internal jugular vein and right subclavian vein. Patient is status post median sternotomy with evidence of prior multivessel CABG. Stable cardiomegaly. Pulmonary edema is unchanged compared to recent prior imaging. Probable bilateral layering effusions. No evidence of pneumothorax. No acute osseous abnormality.  IMPRESSION: New right upper extremity PICC with the catheter tip overlying the superior cavoatrial junction.  Otherwise, unchanged chest x-ray with stable cardiomegaly and moderate pulmonary edema.   Electronically Signed   By: Jacqulynn Cadet M.D.   On: 11/15/2016 09:28  Assessment/Plan: S/P Procedure(s) (LRB): CORONARY ARTERY BYPASS GRAFTING (CABG) x 3, USING LEFT MAMMARY ARTERY AND RIGHT GREATER SAPHENOUS VEIN HARVESTED ENDOSCOPICALLY (N/A) TRANSESOPHAGEAL ECHOCARDIOGRAM (TEE) (N/A)  POD 3  Hemodynamically stable on 0.25 milrinone with Co-ox 63.7. Will continue for now to aid with diuresis and pulmonary vasodilation.  Volume excess: diuresed yesterday but weight is still 12 lbs over preop. Continue lasix 80 bid. Creat up to 1.6 from 1.0 preop probably related to DM, surgery, diuresis.   Postop atrial fibrillation: rate controlled on amio IV. DC lopressor until wheezing resolved.  Morbid Obesity with OSA: continue bipap  as tolerated.  I think his wheezing is due to volume excess with pulmonary edema on CXR.  Continue diuresis.  DM: continue SSI, novolog meal coverage and Lantus bid. Glucose has been in the 200's  Dysphagia: evaluated by ST. On dyphagia 3 diet with thin liquids.  OOB to  chair. Mobilize as tolerated. IS.   LOS: 4 days    Gaye Pollack 11/15/2016

## 2016-11-15 NOTE — Progress Notes (Signed)
RT called to room for pt and bipap alarming "Pt disconnect". Due to pt having beard and moving, leak was greater than 80%.  RT tightened mask and pt asked to be removed and placed on a different O2 device so he could rest.  RT placed pt back on 15LPM [place] salter with sats @ 94% and no increase in WOB or distress.  RT will monitor.

## 2016-11-15 NOTE — Plan of Care (Signed)
Problem: Activity: Goal: Risk for activity intolerance will decrease Outcome: Not Progressing Pt lungs wheezy , Pt remains on high dose OXYGEN  Problem: Cardiac: Goal: Hemodynamic stability will improve Outcome: Progressing HEMO MAINTAINED WITH MILRINONE  Goal: Will show no signs and symptoms of excessive bleeding Outcome: Progressing NO SIGNS OF BLEEDING  Problem: Respiratory: Goal: Levels of oxygenation will improve Outcome: Progressing CONTINUING TO WEAN OXYGEN Goal: Respiratory status will improve Outcome: Not Progressing CONTINUING TO DIURESE AND WEAN O2 Goal: Ability to tolerate decreased levels of ventilator support will improve Outcome: Progressing PT EXTUBATED  Problem: Pain Management: Goal: Pain level will decrease Outcome: Progressing MAINTAINED WITH PO ANALGESIA  Problem: Tissue Perfusion: Goal: Risk of venous thrombosis will decrease Outcome: Progressing SCD'S TO LOWER EXTREMITIES

## 2016-11-15 NOTE — Evaluation (Signed)
Clinical/Bedside Swallow Evaluation Patient Details  Name: Kelly [name] MRN: [id number] Date of Birth: Oct 09, 1955  Today's Date: 11/15/2016 Time: SLP Start Time (ACUTE ONLY): 1253 SLP Stop Time (ACUTE ONLY): 1310 SLP Time Calculation (min) (ACUTE ONLY): 17 min  Past Medical History:  Past Medical History:  Diagnosis Date  . Anxiety   . Arthritis   . Coronary atherosclerosis of native coronary artery    BMS LAD and PTCA RCA 2000,BMS RCA/PLA 2001  . Diabetes mellitus   . Diverticulitis   . Fatty liver   . Gastric ulcer   . GERD (gastroesophageal reflux disease)   . Heart murmur    as a teenager  . HTN (hypertension)   . Hyperlipidemia   . Low-grade NHL (non-Hodgkin's lymphoma) 2007  . Lupus anticoagulant positive   . MI (myocardial infarction) ([place]) 2001  . Neuropathy    peripheral  . Type 2 diabetes mellitus ([place])   . Umbilical hernia    Past Surgical History:  Past Surgical History:  Procedure Laterality Date  . Axillary abscess     left incision and drainage left  . BACK SURGERY     multiple  . BIOPSY  08/15/2014   Procedure: BIOPSY;  Surgeon: Danie Binder, MD;  Location: [facility];  Service: Endoscopy;;  Gastric  . BIOPSY  01/19/2015   Procedure: BIOPSY (GASTRIC ULCER);  Surgeon: Danie Binder, MD;  Location: [facility];  Service: Endoscopy;;  . CARDIAC CATHETERIZATION    . cardiac stents     x5 stents  . COLONOSCOPY  June 2008   Dr. Dellis Filbert Medoff: Mild left colonic diverticulosis  . CORONARY ARTERY BYPASS GRAFT N/A 11/11/2016   Procedure: CORONARY ARTERY BYPASS GRAFTING (CABG) x 3, USING LEFT MAMMARY ARTERY AND RIGHT GREATER SAPHENOUS VEIN HARVESTED ENDOSCOPICALLY;  Surgeon: Ivin Poot, MD;  Location: [facility];  Service: Open Heart Surgery;  Laterality: N/A;  . Dental extractions    . ESOPHAGOGASTRODUODENOSCOPY  June 2008   Dr. Dellis Filbert Medoff: Gastric ulcer, antral, biopsy with reactive changes associated glandular atrophy, no H pylori. No features of lymphoma.  Likely NSAID related  . ESOPHAGOGASTRODUODENOSCOPY  August 2008   Dr. Dellis Filbert Medoff: Complete healing of gastric ulcers.  . ESOPHAGOGASTRODUODENOSCOPY (EGD) WITH PROPOFOL N/A 08/15/2014   SLF: 1. Abnormal pain due to ulcers & gastriitis   . ESOPHAGOGASTRODUODENOSCOPY (EGD) WITH PROPOFOL N/A 01/19/2015   SLF: 1. Persistant ulcer with firm base 2. single gastric polyp found in the gastric antrum. Ulcer base   . EUS N/A 03/21/2015   Thickened gastric wall in pre-pyloric antrum, no obvious intramural mass, fatty pancreas, no obvious pancreatic mass.   . Inguinal lymph node biopsy     right inguinal lymph node biopsy  . INTRAVASCULAR PRESSURE WIRE/FFR STUDY N/A 09/26/2016   Procedure: Intravascular Pressure Wire/FFR Study;  Surgeon: Nelva Bush, MD;  Location: [facility];  Service: Cardiovascular;  Laterality: N/A;  . LEFT HEART CATH AND CORONARY ANGIOGRAPHY N/A 09/26/2016   Procedure: Left Heart Cath and Coronary Angiography;  Surgeon: Nelva Bush, MD;  Location: [facility];  Service: Cardiovascular;  Laterality: N/A;  . NECK SURGERY    . POLYPECTOMY  01/19/2015   Procedure: POLYPECTOMY (GASTRIC);  Surgeon: Danie Binder, MD;  Location: [facility];  Service: Endoscopy;;  . SHOULDER ARTHROSCOPY Left   . TEE WITHOUT CARDIOVERSION N/A 11/11/2016   Procedure: TRANSESOPHAGEAL ECHOCARDIOGRAM (TEE);  Surgeon: Prescott Gum, Collier Salina, MD;  Location: [facility];  Service: Open Heart Surgery;  Laterality:  N/A;   HPI:  Pt is a 61 year old male who was underwent CABG, intubated for procedure 6/26 extubated 6/27 developed acute respiratory fauilure, requiring use of bipap intermittently. ST to evaluate swallow function.    Assessment / Plan / Recommendation Clinical Impression  Pts swallow appears within functional limits however pt remains at increased risk for aspiration given decreased respiratory status. Pt seen upright in chair with venti mask in place on 12 liters. Pt able to maintain adequate  oxgyenation despite intermittent venti mask removal (though O2 did drop slightly) during PO trials. No overt s/sx of aspiration with any consistencies. Spouse at bedside who reports pt with dentures that he uses occasionally for more solid foods. Recommend  dysphagia 3 (mechanical soft) and thin liqiud diet to aid in energy conservation until respiratory status improves.  Full supervision with all PO indicated to assist pt with venti mask removal during PO consumption and to ensure stability with respiratory support during PO consumption. ST to follow up for diet tolerance.   SLP Visit Diagnosis: Dysphagia, unspecified (R13.10)    Aspiration Risk  Mild aspiration risk    Diet Recommendation   Dysphagia 3 (mechanical soft) thin liquids   Medication Administration: Whole meds with liquid    Other  Recommendations Oral Care Recommendations: Oral care BID   Follow up Recommendations 24 hour supervision/assistance;Skilled Nursing facility      Frequency and Duration min 1 x/week  1 week       Prognosis Prognosis for Safe Diet Advancement: Good      Swallow Study   General Date of Onset: 11/11/16 HPI: Pt is a 61 year old male who was underwent CABG, intubated for procedure 6/26 extubated 6/27 developed acute respiratory fauilure, requiring use of bipap intermittently. ST to evaluate swallow function.  Type of Study: Bedside Swallow Evaluation Diet Prior to this Study: Thin liquids Temperature Spikes Noted: No Respiratory Status: Venti-mask History of Recent Intubation: Yes Length of Intubations (days): 1 days Date extubated: 11/12/16 Behavior/Cognition: Alert;Cooperative;Pleasant mood Oral Cavity Assessment: Within Functional Limits Oral Cavity - Dentition: Dentures, not available Vision: Functional for self-feeding Self-Feeding Abilities: Needs assist (to manipulate venti mask during during eating ) Patient Positioning: Upright in chair Baseline Vocal Quality: Normal Volitional  Cough: Strong Volitional Swallow: Able to elicit    Oral/Motor/Sensory Function Overall Oral Motor/Sensory Function: Within functional limits   Ice Chips Ice chips: Not tested   Thin Liquid Thin Liquid: Within functional limits    Nectar Thick Nectar Thick Liquid: Not tested   Honey Thick Honey Thick Liquid: Not tested   Puree Puree: Within functional limits   Solid   GO   Solid: Impaired Oral Phase Impairments: Impaired mastication         Arvil Chaco MA, CCC-SLP Acute Care Speech Language Pathologist    Levi Aland 11/15/2016,1:19 PM

## 2016-11-16 ENCOUNTER — Other Ambulatory Visit: Payer: Self-pay | Admitting: Cardiology

## 2016-11-16 LAB — AEROBIC CULTURE W GRAM STAIN (SUPERFICIAL SPECIMEN): Culture: NO GROWTH

## 2016-11-16 LAB — GLUCOSE, CAPILLARY
Glucose-Capillary: 147 mg/dL — ABNORMAL HIGH (ref 65–99)
Glucose-Capillary: 186 mg/dL — ABNORMAL HIGH (ref 65–99)
Glucose-Capillary: 235 mg/dL — ABNORMAL HIGH (ref 65–99)
Glucose-Capillary: 256 mg/dL — ABNORMAL HIGH (ref 65–99)
Glucose-Capillary: 291 mg/dL — ABNORMAL HIGH (ref 65–99)
Glucose-Capillary: 292 mg/dL — ABNORMAL HIGH (ref 65–99)

## 2016-11-16 LAB — COMPREHENSIVE METABOLIC PANEL
ALT: 14 U/L — ABNORMAL LOW (ref 17–63)
AST: 19 U/L (ref 15–41)
Albumin: 2.4 g/dL — ABNORMAL LOW (ref 3.5–5.0)
Alkaline Phosphatase: 50 U/L (ref 38–126)
Anion gap: 10 (ref 5–15)
BUN: 43 mg/dL — ABNORMAL HIGH (ref 6–20)
CO2: 28 mmol/L (ref 22–32)
Calcium: 8.4 mg/dL — ABNORMAL LOW (ref 8.9–10.3)
Chloride: 92 mmol/L — ABNORMAL LOW (ref 101–111)
Creatinine, Ser: 1.73 mg/dL — ABNORMAL HIGH (ref 0.61–1.24)
GFR calc Af Amer: 48 mL/min — ABNORMAL LOW (ref >60)
GFR calc non Af Amer: 41 mL/min — ABNORMAL LOW (ref >60)
Glucose, Bld: 195 mg/dL — ABNORMAL HIGH (ref 65–99)
Potassium: 3.4 mmol/L — ABNORMAL LOW (ref 3.5–5.1)
Sodium: 130 mmol/L — ABNORMAL LOW (ref 135–145)
Total Bilirubin: 0.7 mg/dL (ref 0.3–1.2)
Total Protein: 6.2 g/dL — ABNORMAL LOW (ref 6.5–8.1)

## 2016-11-16 LAB — POCT I-STAT, CHEM 8
BUN: 39 mg/dL — ABNORMAL HIGH (ref 6–20)
Calcium, Ion: 1.15 mmol/L (ref 1.15–1.40)
Chloride: 86 mmol/L — ABNORMAL LOW (ref 101–111)
Creatinine, Ser: 1.7 mg/dL — ABNORMAL HIGH (ref 0.61–1.24)
Glucose, Bld: 407 mg/dL — ABNORMAL HIGH (ref 65–99)
HCT: 27 % — ABNORMAL LOW (ref 39.0–52.0)
Hemoglobin: 9.2 g/dL — ABNORMAL LOW (ref 13.0–17.0)
Potassium: 3.6 mmol/L (ref 3.5–5.1)
Sodium: 129 mmol/L — ABNORMAL LOW (ref 135–145)
TCO2: 28 mmol/L (ref 0–100)

## 2016-11-16 LAB — CBC
HCT: 27.1 % — ABNORMAL LOW (ref 39.0–52.0)
Hemoglobin: 8.9 g/dL — ABNORMAL LOW (ref 13.0–17.0)
MCH: 31.7 pg (ref 26.0–34.0)
MCHC: 32.8 g/dL (ref 30.0–36.0)
MCV: 96.4 fL (ref 78.0–100.0)
Platelets: 120 10*3/uL — ABNORMAL LOW (ref 150–400)
RBC: 2.81 MIL/uL — ABNORMAL LOW (ref 4.22–5.81)
RDW: 12.8 % (ref 11.5–15.5)
WBC: 7.8 10*3/uL (ref 4.0–10.5)

## 2016-11-16 LAB — COOXEMETRY PANEL
Carboxyhemoglobin: 1.7 % — ABNORMAL HIGH (ref 0.5–1.5)
Methemoglobin: 0.8 % (ref 0.0–1.5)
O2 Saturation: 57.2 %
Total hemoglobin: 9.2 g/dL — ABNORMAL LOW (ref 12.0–16.0)

## 2016-11-16 MED ORDER — INSULIN ASPART 100 UNIT/ML ~~LOC~~ SOLN
15.0000 [IU] | Freq: Three times a day (TID) | SUBCUTANEOUS | Status: DC
  Administered 2016-11-16: 15 [IU] via SUBCUTANEOUS

## 2016-11-16 MED ORDER — POTASSIUM CHLORIDE CRYS ER 20 MEQ PO TBCR
20.0000 meq | EXTENDED_RELEASE_TABLET | Freq: Three times a day (TID) | ORAL | Status: AC
  Administered 2016-11-16 – 2016-11-17 (×3): 20 meq via ORAL
  Filled 2016-11-16 (×3): qty 1

## 2016-11-16 MED ORDER — PANTOPRAZOLE SODIUM 40 MG PO TBEC
40.0000 mg | DELAYED_RELEASE_TABLET | Freq: Every day | ORAL | Status: DC
  Administered 2016-11-16 – 2016-11-21 (×6): 40 mg via ORAL
  Filled 2016-11-16 (×6): qty 1

## 2016-11-16 NOTE — Progress Notes (Signed)
Wheezing noted. C/o pain 7/10, RT called, Neb PRN was given. Pt requested to be placed on Bipap. PRN pain med given. Denies additional need

## 2016-11-16 NOTE — Progress Notes (Signed)
Patient requested to go on bipap after giving PRN nebulizer treatment.  Currently tolerating well.  Will continue to monitor.

## 2016-11-16 NOTE — Progress Notes (Signed)
5 Days Post-Op Procedure(s) (LRB): CORONARY ARTERY BYPASS GRAFTING (CABG) x 3, USING LEFT MAMMARY ARTERY AND RIGHT GREATER SAPHENOUS VEIN HARVESTED ENDOSCOPICALLY (N/A) TRANSESOPHAGEAL ECHOCARDIOGRAM (TEE) (N/A) Subjective: No complaints. Breathing easier today. Eating some. Up to chair some. Objective: Vital signs in last 24 hours: Temp:  [98.1 F (36.7 C)-98.7 F (37.1 C)] 98.1 F (36.7 C) (07/01 1204) Pulse Rate:  [63-149] 110 (07/01 1300) Cardiac Rhythm: Atrial fibrillation (07/01 0800) Resp:  [12-32] 15 (07/01 1300) BP: (96-139)/(49-116) 110/59 (07/01 1300) SpO2:  [80 %-100 %] 97 % (07/01 1300) FiO2 (%):  [40 %-55 %] 55 % (07/01 1207) Weight:  [159.9 kg (352 lb 8 oz)] 159.9 kg (352 lb 8 oz) (07/01 0500)  Hemodynamic parameters for last 24 hours:    Intake/Output from previous day: 06/30 0701 - 07/01 0700 In: 1047.5 [P.O.:240; I.V.:707.5; IV Piggyback:100] Out: 2400 [Urine:2400] Intake/Output this shift: Total I/O In: 431.5 [P.O.:240; I.V.:141.5; IV Piggyback:50] Out: 850 [Urine:850]  General appearance: alert and cooperative. On BiPAP Neurologic: intact Heart: regular rate and rhythm, S1, S2 normal, no murmur, click, rub or gallop Lungs: wheezes bilaterally Extremities: edema moderate Wound: incisions ok  Lab Results:  Recent Labs  11/15/16 0350 11/16/16 0430  WBC 8.8 7.8  HGB 9.4* 8.9*  HCT 28.3* 27.1*  PLT 109* 120*   BMET:  Recent Labs  11/15/16 2044 11/16/16 0430  NA 127* 130*  K 3.5 3.4*  CL 90* 92*  CO2 26 28  GLUCOSE 287* 195*  BUN 43* 43*  CREATININE 1.72* 1.73*  CALCIUM 8.1* 8.4*    PT/INR: No results for input(s): LABPROT, INR in the last 72 hours. ABG    Component Value Date/Time   PHART 7.407 11/14/2016 0334   HCO3 24.9 11/14/2016 0334   TCO2 27 11/14/2016 1550   ACIDBASEDEF 3.0 (H) 11/13/2016 1600   O2SAT 57.2 11/16/2016 0435   CBG (last 3)   Recent Labs  11/16/16 0455 11/16/16 0836 11/16/16 1200  GLUCAP 186* 235* 292*     Assessment/Plan: S/P Procedure(s) (LRB): CORONARY ARTERY BYPASS GRAFTING (CABG) x 3, USING LEFT MAMMARY ARTERY AND RIGHT GREATER SAPHENOUS VEIN HARVESTED ENDOSCOPICALLY (N/A) TRANSESOPHAGEAL ECHOCARDIOGRAM (TEE) (N/A)  He is hemodynamically stable on milrinone 0.25. Co-ox is 57. Will continue for now.  Volume excess: diuresing well. Continue lasix 80 bid. Creat stable at 1.73.  Postop atrial fib: rate controlled on amio. HR 108 this am and looks regular. Holding beta blocker with wheezing.  Morbid obesity with OSA. Continue BiPAP  DM: glucose remains in the high 200's on current regimen. Will increase meal coverage to 15 units.  Continue mobilization, IS.   LOS: 5 days    Kelly [name] 11/16/2016

## 2016-11-17 ENCOUNTER — Inpatient Hospital Stay (HOSPITAL_COMMUNITY): Payer: BC Managed Care – PPO

## 2016-11-17 LAB — POCT I-STAT, CHEM 8
BUN: 32 mg/dL — ABNORMAL HIGH (ref 6–20)
BUN: 33 mg/dL — ABNORMAL HIGH (ref 6–20)
Calcium, Ion: 1.09 mmol/L — ABNORMAL LOW (ref 1.15–1.40)
Calcium, Ion: 1.15 mmol/L (ref 1.15–1.40)
Chloride: 90 mmol/L — ABNORMAL LOW (ref 101–111)
Chloride: 88 mmol/L — ABNORMAL LOW (ref 101–111)
Creatinine, Ser: 1.3 mg/dL — ABNORMAL HIGH (ref 0.61–1.24)
Creatinine, Ser: 1.5 mg/dL — ABNORMAL HIGH (ref 0.61–1.24)
Glucose, Bld: 248 mg/dL — ABNORMAL HIGH (ref 65–99)
Glucose, Bld: 131 mg/dL — ABNORMAL HIGH (ref 65–99)
HCT: 27 % — ABNORMAL LOW (ref 39.0–52.0)
HCT: 28 % — ABNORMAL LOW (ref 39.0–52.0)
Hemoglobin: 9.2 g/dL — ABNORMAL LOW (ref 13.0–17.0)
Hemoglobin: 9.5 g/dL — ABNORMAL LOW (ref 13.0–17.0)
Potassium: 3.5 mmol/L (ref 3.5–5.1)
Potassium: 3.1 mmol/L — ABNORMAL LOW (ref 3.5–5.1)
Sodium: 132 mmol/L — ABNORMAL LOW (ref 135–145)
Sodium: 134 mmol/L — ABNORMAL LOW (ref 135–145)
TCO2: 28 mmol/L (ref 0–100)
TCO2: 35 mmol/L (ref 0–100)

## 2016-11-17 LAB — COMPREHENSIVE METABOLIC PANEL
ALT: 17 U/L (ref 17–63)
AST: 21 U/L (ref 15–41)
Albumin: 2.3 g/dL — ABNORMAL LOW (ref 3.5–5.0)
Alkaline Phosphatase: 54 U/L (ref 38–126)
Anion gap: 9 (ref 5–15)
BUN: 35 mg/dL — ABNORMAL HIGH (ref 6–20)
CO2: 30 mmol/L (ref 22–32)
Calcium: 8.2 mg/dL — ABNORMAL LOW (ref 8.9–10.3)
Chloride: 90 mmol/L — ABNORMAL LOW (ref 101–111)
Creatinine, Ser: 1.5 mg/dL — ABNORMAL HIGH (ref 0.61–1.24)
GFR calc Af Amer: 57 mL/min — ABNORMAL LOW (ref >60)
GFR calc non Af Amer: 49 mL/min — ABNORMAL LOW (ref >60)
Glucose, Bld: 266 mg/dL — ABNORMAL HIGH (ref 65–99)
Potassium: 3.3 mmol/L — ABNORMAL LOW (ref 3.5–5.1)
Sodium: 129 mmol/L — ABNORMAL LOW (ref 135–145)
Total Bilirubin: 0.8 mg/dL (ref 0.3–1.2)
Total Protein: 5.9 g/dL — ABNORMAL LOW (ref 6.5–8.1)

## 2016-11-17 LAB — CBC
HCT: 26.4 % — ABNORMAL LOW (ref 39.0–52.0)
Hemoglobin: 8.9 g/dL — ABNORMAL LOW (ref 13.0–17.0)
MCH: 32.7 pg (ref 26.0–34.0)
MCHC: 33.7 g/dL (ref 30.0–36.0)
MCV: 97.1 fL (ref 78.0–100.0)
Platelets: 129 10*3/uL — ABNORMAL LOW (ref 150–400)
RBC: 2.72 MIL/uL — ABNORMAL LOW (ref 4.22–5.81)
RDW: 13.1 % (ref 11.5–15.5)
WBC: 7.9 10*3/uL (ref 4.0–10.5)

## 2016-11-17 LAB — GLUCOSE, CAPILLARY
Glucose-Capillary: 125 mg/dL — ABNORMAL HIGH (ref 65–99)
Glucose-Capillary: 136 mg/dL — ABNORMAL HIGH (ref 65–99)
Glucose-Capillary: 142 mg/dL — ABNORMAL HIGH (ref 65–99)
Glucose-Capillary: 156 mg/dL — ABNORMAL HIGH (ref 65–99)
Glucose-Capillary: 200 mg/dL — ABNORMAL HIGH (ref 65–99)
Glucose-Capillary: 238 mg/dL — ABNORMAL HIGH (ref 65–99)
Glucose-Capillary: 64 mg/dL — ABNORMAL LOW (ref 65–99)

## 2016-11-17 LAB — COOXEMETRY PANEL
Carboxyhemoglobin: 0.9 % (ref 0.5–1.5)
Methemoglobin: 2 % — ABNORMAL HIGH (ref 0.0–1.5)
O2 Saturation: 59 %
Total hemoglobin: 8.5 g/dL — ABNORMAL LOW (ref 12.0–16.0)

## 2016-11-17 MED ORDER — METOLAZONE 5 MG PO TABS
5.0000 mg | ORAL_TABLET | Freq: Once | ORAL | Status: AC
  Administered 2016-11-17: 5 mg via ORAL
  Filled 2016-11-17: qty 1

## 2016-11-17 MED ORDER — AMIODARONE HCL 200 MG PO TABS
400.0000 mg | ORAL_TABLET | Freq: Every day | ORAL | Status: DC
  Administered 2016-11-17 – 2016-11-19 (×3): 400 mg via ORAL
  Filled 2016-11-17 (×3): qty 2

## 2016-11-17 MED ORDER — INSULIN GLARGINE 100 UNIT/ML ~~LOC~~ SOLN
64.0000 [IU] | Freq: Two times a day (BID) | SUBCUTANEOUS | Status: DC
  Administered 2016-11-17: 64 [IU] via SUBCUTANEOUS
  Filled 2016-11-17 (×3): qty 0.64

## 2016-11-17 MED ORDER — POTASSIUM CHLORIDE 10 MEQ/50ML IV SOLN
10.0000 meq | INTRAVENOUS | Status: AC
  Administered 2016-11-17 – 2016-11-18 (×6): 10 meq via INTRAVENOUS
  Filled 2016-11-17 (×6): qty 50

## 2016-11-17 MED ORDER — FENTANYL CITRATE (PF) 100 MCG/2ML IJ SOLN: 50.0000 ug | Freq: Four times a day (QID) | INTRAMUSCULAR | Status: DC | PRN

## 2016-11-17 MED ORDER — POTASSIUM CHLORIDE 10 MEQ/50ML IV SOLN
10.0000 meq | INTRAVENOUS | Status: AC
  Administered 2016-11-17 (×2): 10 meq via INTRAVENOUS
  Filled 2016-11-17 (×2): qty 50

## 2016-11-17 MED ORDER — INSULIN ASPART 100 UNIT/ML ~~LOC~~ SOLN
20.0000 [IU] | Freq: Three times a day (TID) | SUBCUTANEOUS | Status: DC
  Administered 2016-11-17 (×3): 20 [IU] via SUBCUTANEOUS

## 2016-11-17 NOTE — Progress Notes (Signed)
6 Days Post-Op Procedure(s) (LRB): CORONARY ARTERY BYPASS GRAFTING (CABG) x 3, USING LEFT MAMMARY ARTERY AND RIGHT GREATER SAPHENOUS VEIN HARVESTED ENDOSCOPICALLY (N/A) TRANSESOPHAGEAL ECHOCARDIOGRAM (TEE) (N/A) Subjective: Cont to need hi  flow O2 Excellent diuresis- CXR improves Increasing insulin requirements  More mobile A-fib, cont po amio and DC home on Eliquis  Objective: Vital signs in last 24 hours: Temp:  [98 F (36.7 C)-98.5 F (36.9 C)] 98 F (36.7 C) (07/02 0319) Pulse Rate:  [97-113] 105 (07/02 0319) Cardiac Rhythm: Atrial fibrillation (07/01 0800) Resp:  [15-27] 16 (07/02 0319) BP: (97-146)/(51-94) 97/57 (07/02 0319) SpO2:  [80 %-100 %] 100 % (07/02 0319) FiO2 (%):  [55 %] 55 % (07/01 1207) Weight:  [355 lb 14.4 oz (161.4 kg)] 355 lb 14.4 oz (161.4 kg) (07/02 0319)  Hemodynamic parameters for last 24 hours:   stable Intake/Output from previous day: 07/01 0701 - 07/02 0700 In: 1102.8 [P.O.:480; I.V.:472.8; IV Piggyback:150] Out: 3450 [Urine:3450] Intake/Output this shift: No intake/output data recorded.       Exam    General- alert and comfortable   Lungs- clear without rales, wheezes   Cor- regular rate and rhythm, no murmur , gallop   Abdomen- soft, non-tender   Extremities - warm, non-tender, minimal edema   Neuro- oriented, appropriate, no focal weakness   Lab Results:  Recent Labs  11/16/16 0430 11/16/16 1603 11/17/16 0404  WBC 7.8  --  7.9  HGB 8.9* 9.2* 8.9*  HCT 27.1* 27.0* 26.4*  PLT 120*  --  129*   BMET:  Recent Labs  11/16/16 0430 11/16/16 1603 11/17/16 0404  NA 130* 129* 129*  K 3.4* 3.6 3.3*  CL 92* 86* 90*  CO2 28  --  30  GLUCOSE 195* 407* 266*  BUN 43* 39* 35*  CREATININE 1.73* 1.70* 1.50*  CALCIUM 8.4*  --  8.2*    PT/INR: No results for input(s): LABPROT, INR in the last 72 hours. ABG    Component Value Date/Time   PHART 7.407 11/14/2016 0334   HCO3 24.9 11/14/2016 0334   TCO2 28 11/16/2016 1603   ACIDBASEDEF 3.0 (H) 11/13/2016 1600   O2SAT 59.0 11/17/2016 0409   CBG (last 3)   Recent Labs  11/16/16 2021 11/16/16 2347 11/17/16 0324  GLUCAP 256* 147* 136*    Assessment/Plan: S/P Procedure(s) (LRB): CORONARY ARTERY BYPASS GRAFTING (CABG) x 3, USING LEFT MAMMARY ARTERY AND RIGHT GREATER SAPHENOUS VEIN HARVESTED ENDOSCOPICALLY (N/A) TRANSESOPHAGEAL ECHOCARDIOGRAM (TEE) (N/A) Mobilize Diuresis Diabetes control Continue foley due to diuresing patient antibiotics for sternal drainage, diabetes   LOS: 6 days    Kelly [name] 11/17/2016

## 2016-11-17 NOTE — Plan of Care (Signed)
Problem: Bowel/Gastric: Goal: Will not experience complications related to bowel motility Outcome: Progressing Patient had BM today

## 2016-11-17 NOTE — Progress Notes (Signed)
Patient CBG 64; patient alert oriented; states that he feels like "I got low blood sugar".  Patient given 8oz sugary drink.  CBG recheck 142; patient given peanut butter and crackers; patient states he "feels better now"  Will continue to monitor  Clyda Hurdle RN

## 2016-11-17 NOTE — NC FL2 (Signed)
[place] LEVEL OF CARE SCREENING TOOL     IDENTIFICATION  Patient Name: Kelly [name] Birthdate: Oct 13, 1955 Sex: male Admission Date (Current Location): 11/11/2016  [facility] and [place] Number:  [employer] and Address:  [facility] [place] [place] [address], [place], [place] [address]      Provider Number: [id number]  Attending Physician Name and Address:  Ivin Poot, MD  Relative Name and Phone Number:       Current Level of Care: Hospital Recommended Level of Care: [place] Prior Approval Number:    Date Approved/Denied:   PASRR Number: [id number]  Discharge Plan: SNF    Current Diagnoses: Patient Active Problem List   Diagnosis Date Noted  . Coronary artery disease 11/11/2016  . Accelerating angina ([place]) 09/26/2016  . Morbid obesity due to excess calories ([place]) 11/09/2015  . Colon cancer screening 12/28/2014  . Dyspepsia, NEW ONSET 07/26/2014  . Hepatic cirrhosis ([place]) 05/07/2014  . Low-grade NHL (non-Hodgkin's lymphoma) 05/16/2011  . Lupus anticoagulant positive 05/16/2011  . Gastric ulcer 05/16/2011  . Type 2 diabetes mellitus with vascular disease ([place]) 05/22/2009  . Hyperlipidemia 05/22/2009  . Essential hypertension 05/22/2009  . CORONARY ATHEROSCLEROSIS NATIVE CORONARY ARTERY 05/22/2009    Orientation RESPIRATION BLADDER Height & Weight     Self, Time, Situation, Place  O2 (see DC summary) Incontinent, Indwelling catheter Weight: (!) 355 lb 14.4 oz (161.4 kg) Height:     BEHAVIORAL SYMPTOMS/MOOD NEUROLOGICAL BOWEL NUTRITION STATUS      Continent Diet  AMBULATORY STATUS COMMUNICATION OF NEEDS Skin   Extensive Assist Verbally Surgical wounds                       Personal Care Assistance Level of Assistance  Bathing, Dressing Bathing Assistance: Maximum assistance   Dressing Assistance: Maximum assistance     Functional Limitations Info             SPECIAL CARE FACTORS  FREQUENCY  PT (By licensed PT), OT (By licensed OT), Speech therapy     PT Frequency: 5/wk OT Frequency: 5/wk     Speech Therapy Frequency: 1/wk      Contractures      Additional Factors Info  Code Status, Allergies, Psychotropic, Insulin Sliding Scale Code Status Info: FULL Allergies Info: NKA Psychotropic Info: prozac Insulin Sliding Scale Info: 11/day       Current Medications (11/17/2016):  This is the current hospital active medication list Current Facility-Administered Medications  Medication Dose Route Frequency Provider Last Rate Last Dose  . 0.9 %  sodium chloride infusion  250 mL Intravenous Continuous Nani Skillern, PA-C 1 mL/hr at 11/12/16 2035 250 mL at 11/12/16 2035  . amiodarone (PACERONE) tablet 400 mg  400 mg Oral Daily Prescott Gum, Collier Salina, MD   400 mg at 11/17/16 0917  . aspirin EC tablet 325 mg  325 mg Oral Daily Lars Pinks M, [place]   325 mg at 11/17/16 9381   Or  . aspirin chewable tablet 324 mg  324 mg Per Tube Daily Tacy Dura, Donielle M, PA-C      . bisacodyl (DULCOLAX) EC tablet 10 mg  10 mg Oral Daily Lars Pinks M, PA-C   10 mg at 11/17/16 0175   Or  . bisacodyl (DULCOLAX) suppository 10 mg  10 mg Rectal Daily Lars Pinks M, PA-C      . Chlorhexidine Gluconate Cloth 2 % PADS 6 each  6 each Topical  Daily Ivin Poot, MD   6 each at 11/16/16 1029  . docusate sodium (COLACE) capsule 200 mg  200 mg Oral Daily Lars Pinks M, PA-C   200 mg at 11/17/16 0919  . enoxaparin (LOVENOX) injection 40 mg  40 mg Subcutaneous Q24H Prescott Gum, Collier Salina, MD   40 mg at 11/16/16 1029  . fentaNYL (DURAGESIC - dosed mcg/hr) patch 75 mcg  75 mcg Transdermal Q72H Ivin Poot, MD   75 mcg at 11/16/16 2140  . fentaNYL (SUBLIMAZE) injection 50 mcg  50 mcg Intravenous Q6H PRN Prescott Gum, Collier Salina, MD      . FLUoxetine (PROZAC) capsule 20 mg  20 mg Oral Daily Lars Pinks M, PA-C   20 mg at 11/17/16 0920  . furosemide (LASIX) injection 80  mg  80 mg Intravenous BID Ivin Poot, MD   80 mg at 11/17/16 0920  . gabapentin (NEURONTIN) capsule 300 mg  300 mg Oral QHS Lars Pinks M, PA-C   300 mg at 11/16/16 2140  . insulin aspart (novoLOG) injection 0-20 Units  0-20 Units Subcutaneous Q4H Ivin Poot, MD   4 Units at 11/17/16 [phone]  . insulin aspart (novoLOG) injection 20 Units  20 Units Subcutaneous TID PC Prescott Gum, Collier Salina, MD   20 Units at 11/17/16 [phone]  . insulin glargine (LANTUS) injection 64 Units  64 Units Subcutaneous BID Ivin Poot, MD   64 Units at 11/17/16 1011  . levalbuterol (XOPENEX) nebulizer solution 0.63 mg  0.63 mg Nebulization Q4H PRN Ivin Poot, MD   0.63 mg at 11/16/16 1207  . levalbuterol (XOPENEX) nebulizer solution 1.25 mg  1.25 mg Nebulization Q6H Prescott Gum, Collier Salina, MD   1.25 mg at 11/17/16 0842  . MEDLINE mouth rinse  15 mL Mouth Rinse BID Prescott Gum, Collier Salina, MD   15 mL at 11/17/16 1012  . milrinone (PRIMACOR) 20 MG/100 ML (0.2 mg/mL) infusion  0.125 mcg/kg/min Intravenous Continuous Prescott Gum, Collier Salina, MD 5.8 mL/hr at 11/17/16 1003 0.125 mcg/kg/min at 11/17/16 1003  . ondansetron (ZOFRAN) injection 4 mg  4 mg Intravenous Q6H PRN Lars Pinks M, PA-C   4 mg at 11/11/16 1625  . oxyCODONE (Oxy IR/ROXICODONE) immediate release tablet 5-10 mg  5-10 mg Oral Q3H PRN Nani Skillern, PA-C   10 mg at 11/17/16 0456  . pantoprazole (PROTONIX) EC tablet 40 mg  40 mg Oral Daily Prescott Gum, Collier Salina, MD   40 mg at 11/17/16 [phone]  . piperacillin-tazobactam (ZOSYN) IVPB 3.375 g  3.375 g Intravenous Q8H Carney, Gay Filler, RPH   Stopped at 11/17/16 0900  . simvastatin (ZOCOR) tablet 10 mg  10 mg Oral QHS Lars Pinks M, PA-C   10 mg at 11/16/16 2140  . sodium chloride flush (NS) 0.9 % injection 10-40 mL  10-40 mL Intracatheter Q12H Prescott Gum, Collier Salina, MD   10 mL at 11/17/16 0929  . sodium chloride flush (NS) 0.9 % injection 10-40 mL  10-40 mL Intracatheter PRN Prescott Gum, Collier Salina, MD      . sodium  chloride flush (NS) 0.9 % injection 3 mL  3 mL Intravenous Q12H Lars Pinks M, PA-C   3 mL at 11/16/16 0930  . sodium chloride flush (NS) 0.9 % injection 3 mL  3 mL Intravenous PRN Lars Pinks M, PA-C      . tamsulosin (FLOMAX) capsule 0.4 mg  0.4 mg Oral QPM Lars Pinks M, PA-C   0.4 mg at 11/16/16 1653  . traMADol (ULTRAM) tablet  50-100 mg  50-100 mg Oral Q4H PRN Nani Skillern, PA-C   50 mg at 11/17/16 2336  . zolpidem (AMBIEN) tablet 10 mg  10 mg Oral QHS PRN Nani Skillern, PA-C   10 mg at 11/15/16 2108     Discharge Medications: Please see discharge summary for a list of discharge medications.  Relevant Imaging Results:  Relevant Lab Results:   Additional Information SS#: [id number]  Jorge Ny, LCSW

## 2016-11-17 NOTE — Clinical Social Work Note (Signed)
Clinical Social Work Assessment  Patient Details  Name: Kelly [name] MRN: [id number] Date of Birth: [birth year]  Date of referral:  11/17/16               Reason for consult:  Facility Placement                Permission sought to share information with:    Permission granted to share information::  Yes, Verbal Permission Granted  Name::     Insurance underwriter::  SNF  Relationship::  wife  Contact Information:     Housing/Transportation Living arrangements for the past 2 months:  Single Family Home Source of Information:  Patient Patient Interpreter Needed:  None Criminal Activity/Legal Involvement Pertinent to Current Situation/Hospitalization:  No - Comment as needed Significant Relationships:  Spouse Lives with:  Spouse Do you feel safe going back to the place where you live?  No Need for family participation in patient care:  No (Coment)  Care giving concerns:  Pt lives at home with wife- wife can only offer limited physical assist and home and unable to care for pt given current level of impairment.   Social Worker assessment / plan:  CSW spoke with pt regarding PT recommendation for SNF.  Pt had already been informed of this recommendation from medical team- CSW explained SNF and SNF referral process.  Employment status:  Therapist, music:  Managed Care PT Recommendations:  [place] / Referral to community resources:  [place]  Patient/Family's Response to care:  Pt agreeable to SNF though worried about cost- CSW encouraged pt to call insurance provider to inquire about SNF benefits and copays.  Patient/Family's Understanding of and Emotional Response to Diagnosis, Current Treatment, and Prognosis:  Pt expressed good understanding of current treatment plan- hopeful that rehab would be short- hopeful to return home with family soon.  Emotional Assessment Appearance:  Appears stated age Attitude/Demeanor/Rapport:     Affect (typically observed):  Appropriate, Accepting Orientation:  Oriented to Self, Oriented to Place, Oriented to  Time, Oriented to Situation Alcohol / Substance use:  Not Applicable Psych involvement (Current and /or in the community):  No (Comment)  Discharge Needs  Concerns to be addressed:  Care Coordination Readmission within the last 30 days:  No Current discharge risk:  Physical Impairment Barriers to Discharge:  Continued Medical Work up   [employer], LCSW 11/17/2016, 11:00 AM

## 2016-11-17 NOTE — Progress Notes (Signed)
      [address]       [place] [address]             [phone]      Up in chair  BP 132/66   Pulse (!) 108   Temp 98.4 F (36.9 C) (Oral)   Resp (!) 22   Wt (!) 355 lb 14.4 oz (161.4 kg)   SpO2 95%   BMI 44.48 kg/m   On 4L [place] 95%   Intake/Output Summary (Last 24 hours) at 11/17/16 1815 Last data filed at 11/17/16 1700  Gross per 24 hour  Intake          1600.45 ml  Output             4515 ml  Net         -2914.55 ml   CBG moderately elevated  Continue current care  Steven C. Roxan Hockey, MD [facility] [phone]

## 2016-11-17 NOTE — Consult Note (Addendum)
  Speech Language Pathology Treatment: Dysphagia  Patient Details Name: Kelly [name] MRN: [id number] DOB: Oct 16, 1955 Today's Date: 11/17/2016 Time: 2409-7353 SLP Time Calculation (min) (ACUTE ONLY): 15 min  Assessment / Plan / Recommendation Clinical Impression  Pt seen at bedside for assessment of diet tolerance and education. Pt reports no difficulty with solids, but reports coughing after liquid intake. Pt was observed drinking several sips of water via straw, and did not exhibit overt s/s aspiration at this time. Pt was encouraged to use cup sips if this cough continues to present itself when using straw to drink. RN reports intermittent cough, but indicates pt tolerating current diet.   Pt improving, now on nasal cannula, seated in recliner prior to and during lunch. Given improvement in respiratory status and endurance, as well as having dentures in place, will advance diet to regular/thin liquids. ST will continue to monitor swallow safety and diet tolerance. RN aware.   HPI HPI: Pt is a 61 year old male who was underwent CABG, intubated for procedure 6/26 extubated 6/27 developed acute respiratory fauilure, requiring use of bipap intermittently. ST to evaluate swallow function.       SLP Plan  Continue with current plan of care       Recommendations  Diet recommendations: Dysphagia 3 (mechanical soft);Thin liquid Liquids provided via: Cup;Straw Medication Administration: Whole meds with liquid Supervision: Patient able to self feed Compensations: Minimize environmental distractions;Slow rate;Small sips/bites Postural Changes and/or Swallow Maneuvers: Seated upright 90 degrees;Out of bed for meals;Upright 30-60 min after meal                Oral Care Recommendations: Oral care BID Follow up Recommendations: 24 hour supervision/assistance;Skilled Nursing facility SLP Visit Diagnosis: Dysphagia, unspecified (R13.10) Plan: Continue with current plan of care        [place]. [place], [facility], CCC-SLP [phone]  Shonna Chock 11/17/2016, 1:40 PM

## 2016-11-17 NOTE — Progress Notes (Signed)
Dr. Roxan Hockey notified of K 3.1 after replacements today, and lasix just given.  Orders received. Will continue to monitor pt closely.

## 2016-11-18 ENCOUNTER — Inpatient Hospital Stay (HOSPITAL_COMMUNITY): Payer: BC Managed Care – PPO

## 2016-11-18 LAB — GLUCOSE, CAPILLARY
Glucose-Capillary: 135 mg/dL — ABNORMAL HIGH (ref 65–99)
Glucose-Capillary: 146 mg/dL — ABNORMAL HIGH (ref 65–99)
Glucose-Capillary: 215 mg/dL — ABNORMAL HIGH (ref 65–99)
Glucose-Capillary: 259 mg/dL — ABNORMAL HIGH (ref 65–99)
Glucose-Capillary: 206 mg/dL — ABNORMAL HIGH (ref 65–99)
Glucose-Capillary: 147 mg/dL — ABNORMAL HIGH (ref 65–99)

## 2016-11-18 LAB — CBC
HCT: 28.5 % — ABNORMAL LOW (ref 39.0–52.0)
Hemoglobin: 9.2 g/dL — ABNORMAL LOW (ref 13.0–17.0)
MCH: 31.5 pg (ref 26.0–34.0)
MCHC: 32.3 g/dL (ref 30.0–36.0)
MCV: 97.6 fL (ref 78.0–100.0)
Platelets: 164 10*3/uL (ref 150–400)
RBC: 2.92 MIL/uL — ABNORMAL LOW (ref 4.22–5.81)
RDW: 13 % (ref 11.5–15.5)
WBC: 9.1 10*3/uL (ref 4.0–10.5)

## 2016-11-18 LAB — COMPREHENSIVE METABOLIC PANEL
ALT: 22 U/L (ref 17–63)
AST: 28 U/L (ref 15–41)
Albumin: 2.4 g/dL — ABNORMAL LOW (ref 3.5–5.0)
Alkaline Phosphatase: 71 U/L (ref 38–126)
Anion gap: 9 (ref 5–15)
BUN: 32 mg/dL — ABNORMAL HIGH (ref 6–20)
CO2: 35 mmol/L — ABNORMAL HIGH (ref 22–32)
Calcium: 8.8 mg/dL — ABNORMAL LOW (ref 8.9–10.3)
Chloride: 89 mmol/L — ABNORMAL LOW (ref 101–111)
Creatinine, Ser: 1.42 mg/dL — ABNORMAL HIGH (ref 0.61–1.24)
GFR calc Af Amer: >60 mL/min (ref >60)
GFR calc non Af Amer: 53 mL/min — ABNORMAL LOW (ref >60)
Glucose, Bld: 139 mg/dL — ABNORMAL HIGH (ref 65–99)
Potassium: 2.9 mmol/L — ABNORMAL LOW (ref 3.5–5.1)
Sodium: 133 mmol/L — ABNORMAL LOW (ref 135–145)
Total Bilirubin: 0.9 mg/dL (ref 0.3–1.2)
Total Protein: 6.3 g/dL — ABNORMAL LOW (ref 6.5–8.1)

## 2016-11-18 LAB — COOXEMETRY PANEL
Carboxyhemoglobin: 1.2 % (ref 0.5–1.5)
Methemoglobin: 1.3 % (ref 0.0–1.5)
O2 Saturation: 64.3 %
Total hemoglobin: 9.7 g/dL — ABNORMAL LOW (ref 12.0–16.0)

## 2016-11-18 MED ORDER — INSULIN ASPART 100 UNIT/ML ~~LOC~~ SOLN
0.0000 [IU] | Freq: Three times a day (TID) | SUBCUTANEOUS | Status: DC
  Administered 2016-11-18 (×2): 8 [IU] via SUBCUTANEOUS
  Administered 2016-11-18 – 2016-11-19 (×2): 12 [IU] via SUBCUTANEOUS
  Administered 2016-11-19: 16 [IU] via SUBCUTANEOUS
  Administered 2016-11-19 – 2016-11-20 (×3): 4 [IU] via SUBCUTANEOUS
  Administered 2016-11-20: 12 [IU] via SUBCUTANEOUS
  Administered 2016-11-21: 4 [IU] via SUBCUTANEOUS
  Administered 2016-11-21: 8 [IU] via SUBCUTANEOUS

## 2016-11-18 MED ORDER — INSULIN ASPART 100 UNIT/ML ~~LOC~~ SOLN
10.0000 [IU] | Freq: Three times a day (TID) | SUBCUTANEOUS | Status: DC
  Administered 2016-11-18 – 2016-11-21 (×8): 10 [IU] via SUBCUTANEOUS

## 2016-11-18 MED ORDER — MOVING RIGHT ALONG BOOK
Freq: Once | Status: AC
  Administered 2016-11-18: 08:00:00
  Filled 2016-11-18: qty 1

## 2016-11-18 MED ORDER — POTASSIUM CHLORIDE CRYS ER 20 MEQ PO TBCR
40.0000 meq | EXTENDED_RELEASE_TABLET | Freq: Two times a day (BID) | ORAL | Status: AC
  Administered 2016-11-18 – 2016-11-19 (×3): 40 meq via ORAL
  Filled 2016-11-18 (×3): qty 2

## 2016-11-18 MED ORDER — METOPROLOL TARTRATE 25 MG PO TABS
25.0000 mg | ORAL_TABLET | Freq: Two times a day (BID) | ORAL | Status: DC
  Administered 2016-11-18 – 2016-11-21 (×5): 25 mg via ORAL
  Filled 2016-11-18 (×7): qty 1

## 2016-11-18 MED ORDER — SODIUM CHLORIDE 0.9% FLUSH
3.0000 mL | Freq: Two times a day (BID) | INTRAVENOUS | Status: DC
  Administered 2016-11-20 (×2): 3 mL via INTRAVENOUS

## 2016-11-18 MED ORDER — FUROSEMIDE 10 MG/ML IJ SOLN
40.0000 mg | Freq: Every day | INTRAMUSCULAR | Status: DC
  Administered 2016-11-19 – 2016-11-21 (×3): 40 mg via INTRAVENOUS
  Filled 2016-11-18 (×3): qty 4

## 2016-11-18 MED ORDER — POTASSIUM CHLORIDE 10 MEQ/50ML IV SOLN
10.0000 meq | INTRAVENOUS | Status: AC
  Administered 2016-11-18 (×3): 10 meq via INTRAVENOUS
  Filled 2016-11-18 (×4): qty 50

## 2016-11-18 MED ORDER — MAGNESIUM HYDROXIDE 400 MG/5ML PO SUSP: 30.0000 mL | Freq: Every day | ORAL | Status: DC | PRN

## 2016-11-18 MED ORDER — INSULIN GLARGINE 100 UNIT/ML ~~LOC~~ SOLN
40.0000 [IU] | Freq: Two times a day (BID) | SUBCUTANEOUS | Status: DC
  Administered 2016-11-18 – 2016-11-21 (×6): 40 [IU] via SUBCUTANEOUS
  Filled 2016-11-18 (×9): qty 0.4

## 2016-11-18 MED ORDER — POTASSIUM CHLORIDE 10 MEQ/50ML IV SOLN
10.0000 meq | INTRAVENOUS | Status: AC
  Administered 2016-11-18 (×3): 10 meq via INTRAVENOUS
  Filled 2016-11-18 (×3): qty 50

## 2016-11-18 MED ORDER — SODIUM CHLORIDE 0.9 % IV SOLN: 250.0000 mL | INTRAVENOUS | Status: DC | PRN

## 2016-11-18 MED ORDER — SODIUM CHLORIDE 0.9% FLUSH: 3.0000 mL | INTRAVENOUS | Status: DC | PRN

## 2016-11-18 NOTE — Progress Notes (Signed)
Procedure(s) (LRB): CORONARY ARTERY BYPASS GRAFTING (CABG) x 3, USING LEFT MAMMARY ARTERY AND RIGHT GREATER SAPHENOUS VEIN HARVESTED ENDOSCOPICALLY (N/A) TRANSESOPHAGEAL ECHOCARDIOGRAM (TEE) (N/A) Subjective: Down to 4 L O2 Walking well CXR improved aeration afib 100-110 Objective: Vital signs in last 24 hours: Temp:  [97.6 F (36.4 C)-98.5 F (36.9 C)] 98.5 F (36.9 C) (07/03 0700) Pulse Rate:  [63-111] 109 (07/03 0800) Cardiac Rhythm: Atrial fibrillation (07/03 0800) Resp:  [11-26] 16 (07/03 0800) BP: (104-148)/(53-126) 125/97 (07/03 0800) SpO2:  [92 %-100 %] 97 % (07/03 0852) Weight:  [346 lb 8 oz (157.2 kg)] 346 lb 8 oz (157.2 kg) (07/03 0700)  Hemodynamic parameters for last 24 hours:  stable  Intake/Output from previous day: 07/02 0701 - 07/03 0700 In: 1643.8 [P.O.:940; I.V.:203.8; IV Piggyback:500] Out: 5115 [Urine:5115] Intake/Output this shift: Total I/O In: 419.4 [P.O.:360; I.V.:9.4; IV Piggyback:50] Out: -        Exam    General- alert and comfortable   Lungs- clear without rales, wheezes   Cor- regular rate and rhythm, no murmur , gallop   Abdomen- soft, non-tender   Extremities - warm, non-tender, minimal edema   Neuro- oriented, appropriate, no focal weakness   Lab Results:  Recent Labs  11/17/16 0404  11/17/16 1828 11/18/16 0430  WBC 7.9  --   --  9.1  HGB 8.9*  < > 9.5* 9.2*  HCT 26.4*  < > 28.0* 28.5*  PLT 129*  --   --  164  < > = values in this interval not displayed. BMET:  Recent Labs  11/17/16 0404  11/17/16 1828 11/18/16 0430  NA 129*  < > 134* 133*  K 3.3*  < > 3.1* 2.9*  CL 90*  < > 88* 89*  CO2 30  --   --  35*  GLUCOSE 266*  < > 131* 139*  BUN 35*  < > 33* 32*  CREATININE 1.50*  < > 1.50* 1.42*  CALCIUM 8.2*  --   --  8.8*  < > = values in this interval not displayed.  PT/INR: No results for input(s): LABPROT, INR in the last 72 hours. ABG    Component Value Date/Time   PHART 7.407 11/14/2016 0334   HCO3 24.9  11/14/2016 0334   TCO2 35 11/17/2016 1828   ACIDBASEDEF 3.0 (H) 11/13/2016 1600   O2SAT 64.3 11/18/2016 0430   CBG (last 3)   Recent Labs  11/17/16 2338 11/18/16 0435 11/18/16 0743  GLUCAP 125* 135* 146*    Assessment/Plan: S/P Procedure(s) (LRB): CORONARY ARTERY BYPASS GRAFTING (CABG) x 3, USING LEFT MAMMARY ARTERY AND RIGHT GREATER SAPHENOUS VEIN HARVESTED ENDOSCOPICALLY (N/A) TRANSESOPHAGEAL ECHOCARDIOGRAM (TEE) (N/Astart Eliquis at DC- Lovenox in hospital for afib   LOS: 7 days  tx to stepdown Cont lovenox for afib  Kelly [name] 11/18/2016

## 2016-11-18 NOTE — Progress Notes (Signed)
SATURATION QUALIFICATIONS: (This note is used to comply with regulatory documentation for home oxygen)  Patient Saturations on Room Air at Rest = 83-85%  Patient Saturations on Room Air while Ambulating = NT as pt desats on RA at rest  Patient Saturations on 6 Liters of oxygen while Ambulating = >88% (pt desat to 85-87% on 4LO2)  Please briefly explain why patient needs home oxygen:At present requires 4-6LO2 to keep sats >88%. Thanks.  [place] [phone] (pager)

## 2016-11-18 NOTE — Progress Notes (Signed)
Physical Therapy Treatment Patient Details Name: Kelly [name] MRN: [id number] DOB: 1955-06-12 Today's Date: 11/18/2016    History of Present Illness Pt admit for CABG x 3.  PMH:  anxiety, CAD, DM, HTN, MI, lupus, prior stents    PT Comments    Pt admitted with above diagnosis. Pt currently with functional limitations due to balance and endurance deficits. Pt was able to ambulate entire unit with RW and min guard assist.  Pt desats on 4LO2 needing 6L O2 to keep sats >88%.  Will continue acute PT.   Pt will benefit from skilled PT to increase their independence and safety with mobility to allow discharge to the venue listed below.    SATURATION QUALIFICATIONS: (This note is used to comply with regulatory documentation for home oxygen)  Patient Saturations on Room Air at Rest = 83-85%  Patient Saturations on Room Air while Ambulating = NT as pt desats on RA at rest  Patient Saturations on 6 Liters of oxygen while Ambulating = >88% (pt desat to 85-87% on 4LO2)  Please briefly explain why patient needs home oxygen:At present requires 4-6LO2 to keep sats >88%.   Follow Up Recommendations  SNF;Supervision/Assistance - 24 hour     Equipment Recommendations  Rolling walker with 5" wheels;3in1 (PT)    Recommendations for Other Services       Precautions / Restrictions Precautions Precautions: Fall;Sternal Restrictions Weight Bearing Restrictions: Yes (sternal  precautions)    Mobility  Bed Mobility               General bed mobility comments: in chair on arrival   Transfers Overall transfer level: Needs assistance Equipment used:  (held pillow) Transfers: Sit to/from Stand Sit to Stand: Min guard         General transfer comment: Pt hugged heart pillow and stood with min guard assist and cues.  Pt did very well using momentum and stated he needed assist but actually no assist given to pt.    Ambulation/Gait Ambulation/Gait assistance: +2 safety/equipment;Min  guard Ambulation Distance (Feet): 370 Feet Assistive device: Rolling walker (2 wheeled) Gait Pattern/deviations: Step-through pattern;Decreased stride length;Antalgic;Wide base of support   Gait velocity interpretation: Below normal speed for age/gender General Gait Details: Pt ambulated to bathroom and thought he had to have BM but couldn't go.  Pt washed his front legs and penis but PT had to assist with cleaning his bottom and back of legs.  Pt used RW and walked entire unit with several standing rest breaks to breathe as he desats at times.  Had to incr from 4 to 6 LO2 to keep sats >88%. DOE 3/4 at times.     Stairs            Wheelchair Mobility    Modified Rankin (Stroke Patients Only)       Balance Overall balance assessment: Needs assistance;History of Falls Sitting-balance support: No upper extremity supported;Feet supported Sitting balance-Leahy Scale: Fair     Standing balance support: Bilateral upper extremity supported;During functional activity Standing balance-Leahy Scale: Poor Standing balance comment: relies on UE support for [email or web address].e              High level balance activites: Direction changes;Turns;Sudden stops High Level Balance Comments: Needed min assist for challenges.            Cognition Arousal/Alertness: Awake/alert Behavior During Therapy: Flat affect Overall Cognitive Status: Within Functional Limits for tasks assessed  Exercises      General Comments        Pertinent Vitals/Pain Pain Assessment: No/denies pain    Home Living                      Prior Function            PT Goals (current goals can now be found in the care plan section) Progress towards PT goals: Progressing toward goals    Frequency    Min 3X/week      PT Plan Current plan remains appropriate    Co-evaluation              AM-PAC PT "6 Clicks" Daily Activity  Outcome  Measure  Difficulty turning over in bed (including adjusting bedclothes, sheets and blankets)?: A Lot Difficulty moving from lying on back to sitting on the side of the bed? : A Lot Difficulty sitting down on and standing up from a chair with arms (e.g., wheelchair, bedside commode, etc,.)?: A Little Help needed moving to and from a bed to chair (including a wheelchair)?: A Little Help needed walking in hospital room?: A Little Help needed climbing 3-5 steps with a railing? : Total 6 Click Score: 14    End of Session Equipment Utilized During Treatment: Gait belt;Oxygen Activity Tolerance: Patient limited by fatigue Patient left: with call bell/phone within reach;with family/visitor present;in chair Nurse Communication: Mobility status PT Visit Diagnosis: Muscle weakness (generalized) (M62.81);Unsteadiness on feet (R26.81);Pain Pain - part of body:  (sternum)     Time: 2778-2423 PT Time Calculation (min) (ACUTE ONLY): 38 min  Charges:  $Gait Training: 23-37 mins $Self Care/Home Management: 8-22                    G Codes:       Kelly [name],PT Acute Rehabilitation [phone] (pager)    [name] 11/18/2016, 10:11 AM

## 2016-11-18 NOTE — Progress Notes (Signed)
Patient refusing CPAP for tonight, RT made pt aware that if he changed his mind to call.

## 2016-11-19 ENCOUNTER — Inpatient Hospital Stay (HOSPITAL_COMMUNITY): Payer: BC Managed Care – PPO

## 2016-11-19 LAB — BASIC METABOLIC PANEL
Anion gap: 9 (ref 5–15)
BUN: 33 mg/dL — ABNORMAL HIGH (ref 6–20)
CO2: 33 mmol/L — ABNORMAL HIGH (ref 22–32)
Calcium: 8.8 mg/dL — ABNORMAL LOW (ref 8.9–10.3)
Chloride: 92 mmol/L — ABNORMAL LOW (ref 101–111)
Creatinine, Ser: 1.29 mg/dL — ABNORMAL HIGH (ref 0.61–1.24)
GFR calc Af Amer: >60 mL/min (ref >60)
GFR calc non Af Amer: 59 mL/min — ABNORMAL LOW (ref >60)
Glucose, Bld: 82 mg/dL (ref 65–99)
Potassium: 3.3 mmol/L — ABNORMAL LOW (ref 3.5–5.1)
Sodium: 134 mmol/L — ABNORMAL LOW (ref 135–145)

## 2016-11-19 LAB — GLUCOSE, CAPILLARY
Glucose-Capillary: 82 mg/dL (ref 65–99)
Glucose-Capillary: 180 mg/dL — ABNORMAL HIGH (ref 65–99)
Glucose-Capillary: 294 mg/dL — ABNORMAL HIGH (ref 65–99)
Glucose-Capillary: 289 mg/dL — ABNORMAL HIGH (ref 65–99)
Glucose-Capillary: 335 mg/dL — ABNORMAL HIGH (ref 65–99)

## 2016-11-19 LAB — CBC
HCT: 29.4 % — ABNORMAL LOW (ref 39.0–52.0)
Hemoglobin: 9.4 g/dL — ABNORMAL LOW (ref 13.0–17.0)
MCH: 31.8 pg (ref 26.0–34.0)
MCHC: 32 g/dL (ref 30.0–36.0)
MCV: 99.3 fL (ref 78.0–100.0)
Platelets: 170 10*3/uL (ref 150–400)
RBC: 2.96 MIL/uL — ABNORMAL LOW (ref 4.22–5.81)
RDW: 13 % (ref 11.5–15.5)
WBC: 9.7 10*3/uL (ref 4.0–10.5)

## 2016-11-19 MED ORDER — FERROUS SULFATE 325 (65 FE) MG PO TABS
325.0000 mg | ORAL_TABLET | Freq: Every day | ORAL | Status: DC
  Administered 2016-11-20 – 2016-11-21 (×2): 325 mg via ORAL
  Filled 2016-11-19 (×2): qty 1

## 2016-11-19 MED ORDER — FOLIC ACID 1 MG PO TABS
1.0000 mg | ORAL_TABLET | Freq: Every day | ORAL | Status: DC
  Administered 2016-11-19 – 2016-11-21 (×3): 1 mg via ORAL
  Filled 2016-11-19 (×3): qty 1

## 2016-11-19 MED ORDER — POTASSIUM CHLORIDE CRYS ER 20 MEQ PO TBCR
40.0000 meq | EXTENDED_RELEASE_TABLET | Freq: Once | ORAL | Status: AC
  Administered 2016-11-19: 40 meq via ORAL
  Filled 2016-11-19: qty 2

## 2016-11-19 NOTE — Evaluation (Signed)
Occupational Therapy Evaluation Patient Details Name: Kelly [name] MRN: [id number] DOB: [birth year] Today's Date: 11/19/2016    History of Present Illness Pt admit for CABG x 3.  PMH:  anxiety, CAD, DM, HTN, MI, lupus, prior stents   Clinical Impression   Pt was independent prior to admission, struggled with LB dressing. Pt presents with decreased activity tolerance and impaired standing balance. He requires min assist to stand from chair and set up to max assist with ADL. Educated in use of AE for LB bathing and dressing. Pt's wife with concerns about being able to handle pt at home. Will follow.    Follow Up Recommendations  SNF;Supervision/Assistance - 24 hour (HHOT if pt does not qualify)    Equipment Recommendations  3 in 1 bedside commode (bariatric)    Recommendations for Other Services       Precautions / Restrictions Precautions Precautions: Fall;Sternal Precaution Comments: educated in sternal precautions related to ADL and IADL      Mobility Bed Mobility               General bed mobility comments: in chair on arrival   Transfers Overall transfer level: Needs assistance Equipment used: Rolling walker (2 wheeled) Transfers: Sit to/from Stand Sit to Stand: +2 safety/equipment;Min assist         General transfer comment: min assist to rise, held heart pillow, used momentum    Balance     Sitting balance-Leahy Scale: Good       Standing balance-Leahy Scale: Fair Standing balance comment: can release walker in static standing for pericare                           ADL either performed or assessed with clinical judgement   ADL Overall ADL's : Needs assistance/impaired Eating/Feeding: Independent;Sitting   Grooming: Wash/dry hands;Wash/dry face;Brushing hair;Sitting;Set up   Upper Body Bathing: Minimal assistance;Sitting Upper Body Bathing Details (indicate cue type and reason): assist for back Lower Body Bathing: Maximal  assistance;Sit to/from stand Lower Body Bathing Details (indicate cue type and reason): educated in use of long handled bath sponge Upper Body Dressing : Set up;Sitting   Lower Body Dressing: Maximal assistance;Sit to/from stand Lower Body Dressing Details (indicate cue type and reason): educated in use of AE Toilet Transfer: Min guard;Ambulation;RW Toilet Transfer Details (indicate cue type and reason): stood to urinate Toileting- Water quality scientist and Hygiene: Min guard (standing)       Functional mobility during ADLs: Min guard;+2 for safety/equipment;Rolling walker       Vision Baseline Vision/History: Wears glasses Wears Glasses: Reading only Patient Visual Report: No change from baseline       Perception     Praxis      Pertinent Vitals/Pain Pain Assessment: No/denies pain     Hand Dominance Right   Extremity/Trunk Assessment Upper Extremity Assessment Upper Extremity Assessment: Overall WFL for tasks assessed (pt with rotator cuff issues)   Lower Extremity Assessment Lower Extremity Assessment: Defer to PT evaluation   Cervical / Trunk Assessment Cervical / Trunk Assessment: Normal   Communication Communication Communication: No difficulties   Cognition Arousal/Alertness: Awake/alert Behavior During Therapy: Flat affect Overall Cognitive Status: Within Functional Limits for tasks assessed                                     General Comments  Exercises     Shoulder Instructions      Home Living Family/patient expects to be discharged to:: Private residence Living Arrangements: Spouse/significant other Available Help at Discharge: Family;Available 24 hours/day Type of Home: House Home Access: Stairs to enter;Ramped entrance Entrance Stairs-Number of Steps: flight Entrance Stairs-Rails: Left Home Layout: Full bath on main level;Able to live on main level with bedroom/bathroom;Two level     Bathroom Shower/Tub: Emergency planning/management officer: Handicapped height     Home Equipment: Building services engineer Comments: 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, but works in a prison      Prior Functioning/Environment Level of Independence: Independent        Comments: struggled to manage compression hose and do LB dressing        OT Problem List: Decreased activity tolerance;Impaired balance (sitting and/or standing);Decreased knowledge of use of DME or AE;Cardiopulmonary status limiting activity;Impaired UE functional use      OT Treatment/Interventions: Self-care/ADL training;DME and/or AE instruction;Energy conservation;Patient/family education;Balance training;Therapeutic activities    OT Goals(Current goals can be found in the care plan section) Acute Rehab OT Goals Patient Stated Goal: to get better OT Goal Formulation: With patient Time For Goal Achievement: 12/03/16 Potential to Achieve Goals: Good ADL Goals Pt Will Perform Grooming: with supervision;standing Pt Will Perform Lower Body Bathing: with supervision;with adaptive equipment;sit to/from stand Pt Will Perform Lower Body Dressing: with supervision;with adaptive equipment;sit to/from stand Pt Will Transfer to Toilet: with supervision;ambulating;bedside commode (over toilet) Pt Will Perform Toileting - Clothing Manipulation and hygiene: with supervision;sit to/from stand Pt Will Perform Tub/Shower Transfer: Shower transfer;with supervision;ambulating;shower seat;rolling walker Additional ADL Goal #1: Pt will state at least 3 energy conservation strategies as instructed.  OT Frequency: Min 2X/week   Barriers to D/C:            Co-evaluation              AM-PAC PT "6 Clicks" Daily Activity     Outcome Measure Help from another person eating meals?: None Help from another person taking care of personal grooming?: A Little Help from another person toileting, which includes using toliet,  bedpan, or urinal?: A Little Help from another person bathing (including washing, rinsing, drying)?: A Lot Help from another person to put on and taking off regular upper body clothing?: None Help from another person to put on and taking off regular lower body clothing?: A Lot 6 Click Score: 18   End of Session Equipment Utilized During Treatment: Rolling walker;Gait belt;Oxygen Nurse Communication: Other (comment) (pt with itching)  Activity Tolerance: Patient tolerated treatment well Patient left: in chair;with call bell/phone within reach;with family/visitor present  OT Visit Diagnosis: Unsteadiness on feet (R26.81);Muscle weakness (generalized) (M62.81)                Time: 3235-5732 OT Time Calculation (min): 57 min Charges:  OT General Charges $OT Visit: 1 Procedure OT Evaluation $OT Eval Moderate Complexity: 1 Procedure OT Treatments $Self Care/Home Management : 38-52 mins G-Codes:       Malka So 11/19/2016, 2:12 PM  [phone]

## 2016-11-19 NOTE — Progress Notes (Signed)
CSW met with pt and pt wife to discuss bed offers.  Only offer at this time is [facility]- wife is not thrilled with this option but understands this is only choice.  Pt and wife are discussing possibility of taking patient home with home services but agreeable to CSW beginning [place] with [facility] in case they don't feel safe going home.  Facility aware and will attempt to start auth today but [employer] likely closed for holiday- would likely not get auth until Friday  CSW did express concern to pt and wife that [facility] would deny stay at SNF due to patient current mobility (walking 327f)  CSW will continue to follow  JJorge Ny LJuno BeachSocial Worker [phone]

## 2016-11-19 NOTE — Progress Notes (Addendum)
      [address]       [place] [address]             [phone]        8 Days Post-Op Procedure(s) (LRB): CORONARY ARTERY BYPASS GRAFTING (CABG) x 3, USING LEFT MAMMARY ARTERY AND RIGHT GREATER SAPHENOUS VEIN HARVESTED ENDOSCOPICALLY (N/A) TRANSESOPHAGEAL ECHOCARDIOGRAM (TEE) (N/A)  Subjective: Patient without complaints this am. He is about to go to x ray.  Objective: Vital signs in last 24 hours: Temp:  [97.8 F (36.6 C)-98.6 F (37 C)] 97.9 F (36.6 C) (07/04 0436) Pulse Rate:  [74-110] 81 (07/04 0436) Cardiac Rhythm: Atrial fibrillation;Bundle branch block (07/04 0700) Resp:  [17-30] 22 (07/04 0436) BP: (94-142)/(48-86) 110/70 (07/04 0436) SpO2:  [96 %-100 %] 96 % (07/04 0436) Weight:  [158 kg (348 lb 6.4 oz)] 158 kg (348 lb 6.4 oz) (07/04 0436)  Pre op weight 155.1 kg Current Weight  11/19/16 (!) 158 kg (348 lb 6.4 oz)      Intake/Output from previous day: 07/03 0701 - 07/04 0700 In: 1525.4 [P.O.:1256; I.V.:19.4; IV Piggyback:250] Out: -    Physical Exam:  Cardiovascular: IRRR IRRR Pulmonary: Diminished at bases Abdomen: Soft, non tender, obese,bowel sounds present. Extremities: Bilateral lower extremity edema. Wounds: Clean and dry.  No erythema or signs of infection. Right thigh wound dehisced secondary to LE edema. Serours drainage-no sign of infection.  Lab Results: CBC: Recent Labs  11/18/16 0430 11/19/16 0407  WBC 9.1 9.7  HGB 9.2* 9.4*  HCT 28.5* 29.4*  PLT 164 170   BMET:  Recent Labs  11/18/16 0430 11/19/16 0407  NA 133* 134*  K 2.9* 3.3*  CL 89* 92*  CO2 35* 33*  GLUCOSE 139* 82  BUN 32* 33*  CREATININE 1.42* 1.29*  CALCIUM 8.8* 8.8*    PT/INR:  Lab Results  Component Value Date   INR 1.26 11/11/2016   INR 1.05 11/07/2016   INR 1.1 10/06/2016   ABG:  INR: Will add last result for INR, ABG once components are confirmed Will add last 4 CBG results once components are  confirmed  Assessment/Plan:  1. CV - A fib with CVR. On Amiodarone 400 mg daily and Lopressor 25 mg bid. Per Dr. Prescott Gum, start Eliquis at discharge for a fib. 2.  Pulmonary - On 4 liters of oxygen via [place]. Wean to room air as able. Encourage incentive spirometer. CXR ordered but not taken yet.  3. Volume Overload - On Lasix 40 mg IV daily. 4.  Acute blood loss anemia - H and H stable at 9.4 and 29.4. Start Ferrous sulfate and folic acid. 5. OSA-patient refusing CPAP 6. Supplement potassium 7. DM-CBGs 147/82/180. On Insulin only. Was also on Metformin pre op. Will restart once creatinine decreased more as is being diuresed. Pre op HGA1C 8.4. He will need close medical follow up after discharge. 8. Creatinine decreased from 1.42 to 1.29 9. ID-on Zosyn for pulmonary etiology. Hope to stop in am as will be one week 10. Remove EPW in am  Zamir Staples MPA-C 11/19/2016,8:58 AM

## 2016-11-19 NOTE — Progress Notes (Signed)
  Speech Language Pathology Treatment: Dysphagia  Patient Details Name: Kelly [name] MRN: [id number] DOB: September 13, 1955 Today's Date: 11/19/2016 Time: 1150-1200 SLP Time Calculation (min) (ACUTE ONLY): 10 min  Assessment / Plan / Recommendation Clinical Impression  Pt eating lunch upon arrival.  Consuming regular solids, thin liquids with no further s/s of dysphagia.  There is adequate mastication; brisk swallow response, no functional deficits nor s/s of aspiration.  Pt passed three oz water test without difficulty.  Dysphagia resolved - our services will sign off.   HPI HPI: Pt is a 61 year old male who was underwent CABG, intubated for procedure 6/26 extubated 6/27 developed acute respiratory fauilure, requiring use of bipap intermittently. ST to evaluate swallow function.       SLP Plan  All goals met       Recommendations  Diet recommendations: Regular;Thin liquid Liquids provided via: Cup;Straw Medication Administration: Whole meds with liquid Supervision: Patient able to self feed                Oral Care Recommendations: Oral care BID Plan: All goals met       GO                Kelly [name] 11/19/2016, 12:00 PM

## 2016-11-19 NOTE — Progress Notes (Signed)
11/19/2016- Respiratory care note- Pt wearing nasal cannula and declined use of BIPAP machine.  Pt encouraged to call if changes his mind.

## 2016-11-20 DIAGNOSIS — Z951 Presence of aortocoronary bypass graft: Secondary | ICD-10-CM

## 2016-11-20 LAB — BASIC METABOLIC PANEL
Anion gap: 10 (ref 5–15)
BUN: 31 mg/dL — AB (ref 6–20)
CO2: 34 mmol/L — ABNORMAL HIGH (ref 22–32)
CREATININE: 1.3 mg/dL — AB (ref 0.61–1.24)
Calcium: 8.8 mg/dL — ABNORMAL LOW (ref 8.9–10.3)
Chloride: 90 mmol/L — ABNORMAL LOW (ref 101–111)
GFR calc Af Amer: >60 mL/min (ref >60)
GFR, EST NON AFRICAN AMERICAN: 59 mL/min — AB (ref >60)
GLUCOSE: 192 mg/dL — AB (ref 65–99)
Potassium: 3.5 mmol/L (ref 3.5–5.1)
Sodium: 134 mmol/L — ABNORMAL LOW (ref 135–145)

## 2016-11-20 LAB — GLUCOSE, CAPILLARY
Glucose-Capillary: 181 mg/dL — ABNORMAL HIGH (ref 65–99)
Glucose-Capillary: 251 mg/dL — ABNORMAL HIGH (ref 65–99)
Glucose-Capillary: 175 mg/dL — ABNORMAL HIGH (ref 65–99)

## 2016-11-20 MED ORDER — METFORMIN HCL 500 MG PO TABS
500.0000 mg | ORAL_TABLET | Freq: Two times a day (BID) | ORAL | Status: DC
  Administered 2016-11-20 – 2016-11-21 (×3): 500 mg via ORAL
  Filled 2016-11-20 (×3): qty 1

## 2016-11-20 MED ORDER — POTASSIUM CHLORIDE CRYS ER 20 MEQ PO TBCR
40.0000 meq | EXTENDED_RELEASE_TABLET | Freq: Once | ORAL | Status: AC
  Administered 2016-11-20: 40 meq via ORAL
  Filled 2016-11-20: qty 2

## 2016-11-20 MED ORDER — AMIODARONE HCL 200 MG PO TABS
200.0000 mg | ORAL_TABLET | Freq: Every day | ORAL | Status: DC
  Administered 2016-11-20 – 2016-11-21 (×2): 200 mg via ORAL
  Filled 2016-11-20 (×2): qty 1

## 2016-11-20 MED ORDER — LEVOFLOXACIN IN D5W 500 MG/100ML IV SOLN: 500.0000 mg | INTRAVENOUS | Status: DC

## 2016-11-20 MED ORDER — DEXTROSE 5 % IV SOLN: 1.0000 g | Freq: Two times a day (BID) | INTRAVENOUS | Status: DC

## 2016-11-20 MED ORDER — METFORMIN HCL 500 MG PO TABS: 1000.0000 mg | ORAL_TABLET | Freq: Two times a day (BID) | ORAL | Status: DC

## 2016-11-20 MED ORDER — SULFAMETHOXAZOLE-TRIMETHOPRIM 800-160 MG PO TABS
1.0000 | ORAL_TABLET | Freq: Two times a day (BID) | ORAL | Status: DC
  Administered 2016-11-20: 1 via ORAL
  Filled 2016-11-20: qty 1

## 2016-11-20 MED ORDER — CEPHALEXIN 500 MG PO CAPS
500.0000 mg | ORAL_CAPSULE | Freq: Three times a day (TID) | ORAL | Status: DC
  Administered 2016-11-20 – 2016-11-21 (×3): 500 mg via ORAL
  Filled 2016-11-20 (×3): qty 1

## 2016-11-20 NOTE — Progress Notes (Signed)
Occupational Therapy Treatment Patient Details Name: Kelly [name] MRN: [id number] DOB: 12-27-1955 Today's Date: 11/20/2016    History of present illness Pt admit for CABG x 3.  PMH:  anxiety, CAD, DM, HTN, MI, lupus, prior stents   OT comments  Pt able to stand from recliner with greater ease this visit. Stood at sink for grooming and toileted with min guard assist. Pt is knowledgeable in sternal precautions. Educated in energy conservation with showering. Per wife, pt to go home Saturday. Wife no longer has concerns about her ability to care for pt at home. Updated d/c disposition.   Follow Up Recommendations  Home health OT    Equipment Recommendations  3 in 1 bedside commode (bariatric)    Recommendations for Other Services      Precautions / Restrictions Precautions Precautions: Fall;Sternal Precaution Comments: pt able to state sternal precautions       Mobility Bed Mobility               General bed mobility comments: in chair on arrival   Transfers Overall transfer level: Needs assistance Equipment used: Rolling walker (2 wheeled) Transfers: Sit to/from Stand Sit to Stand: Min guard         General transfer comment: held heart pillow, min guard for safety, use of momentum    Balance     Sitting balance-Leahy Scale: Good       Standing balance-Leahy Scale: Fair Standing balance comment: can release walker in static standing for grooming                           ADL either performed or assessed with clinical judgement   ADL Overall ADL's : Needs assistance/impaired     Grooming: Brushing hair;Wash/dry face;Standing;Min guard                   Toilet Transfer: Min guard;Ambulation;RW   Toileting- Clothing Manipulation and Hygiene: Min guard       Functional mobility during ADLs: Min guard;Rolling walker General ADL Comments: Educated pt in energy conservation during showering.     Vision       Perception      Praxis      Cognition Arousal/Alertness: Awake/alert Behavior During Therapy: WFL for tasks assessed/performed Overall Cognitive Status: Within Functional Limits for tasks assessed                                          Exercises     Shoulder Instructions       General Comments      Pertinent Vitals/ Pain       Pain Assessment: No/denies pain  Home Living                                          Prior Functioning/Environment              Frequency  Min 2X/week        Progress Toward Goals  OT Goals(current goals can now be found in the care plan section)  Progress towards OT goals: Progressing toward goals  Acute Rehab OT Goals Patient Stated Goal: to get better OT Goal Formulation: With patient Time For Goal Achievement: 12/03/16 Potential to Achieve Goals: Good  Plan Discharge plan needs to be updated    Co-evaluation                 AM-PAC PT "6 Clicks" Daily Activity     Outcome Measure   Help from another person eating meals?: None Help from another person taking care of personal grooming?: A Little Help from another person toileting, which includes using toliet, bedpan, or urinal?: A Little Help from another person bathing (including washing, rinsing, drying)?: A Little Help from another person to put on and taking off regular upper body clothing?: None Help from another person to put on and taking off regular lower body clothing?: A Little 6 Click Score: 20    End of Session Equipment Utilized During Treatment: Rolling walker;Gait belt  OT Visit Diagnosis: Unsteadiness on feet (R26.81);Muscle weakness (generalized) (M62.81)   Activity Tolerance Patient tolerated treatment well   Patient Left in chair;with call bell/phone within reach;with family/visitor present   Nurse Communication          Time: 4656-8127 OT Time Calculation (min): 31 min  Charges: OT General Charges $OT Visit: 1  Procedure OT Treatments $Self Care/Home Management : 23-37 mins    Malka So 11/20/2016, 4:01 PM  [phone]

## 2016-11-20 NOTE — Progress Notes (Signed)
Epicardial pacing wires removed without difficulty.  Will continue to monitor. 

## 2016-11-20 NOTE — Progress Notes (Signed)
Inpatient Diabetes Program Recommendations  AACE/ADA: New Consensus Statement on Inpatient Glycemic Control (2015)  Target Ranges:  Prepandial:   less than 140 mg/dL      Peak postprandial:   less than 180 mg/dL (1-2 hours)      Critically ill patients:  140 - 180 mg/dL   Lab Results  Component Value Date   GLUCAP 251 (H) 11/20/2016   HGBA1C 8.4 (H) 11/07/2016    Review of Glycemic Control:  Results for Kelly [name], Kelly [name] (MRN [id number]) as of 11/20/2016 12:30  Ref. Range 11/19/2016 11:15 11/19/2016 16:52 11/19/2016 20:47 11/20/2016 06:27 11/20/2016 11:11  Glucose-Capillary Latest Ref Range: 65 - 99 mg/dL 294 (H) 289 (H) 335 (H) 181 (H) 251 (H)   Diabetes history: Type 2 diabetes Outpatient Diabetes medications: Basaglar 80 units q HS, Novolog 18-24 units tid with meals, Metformin 500 mg bid, Victoza 1.8 mg daily Current orders for Inpatient glycemic control:  TCTS tid with meals and HS, Lantus 40 units bid, Novolog 10 units tid with meals  Inpatient Diabetes Program Recommendations:   Consider changing Novolog correction to moderate per Glycemic control order set.  Also consider increasing Lantus to 45 units bid and increase Novolog to 12 units tid with meals (hold if patient eats less than 50%).  Will follow. Thanks, Adah Perl, RN, BC-ADM Inpatient Diabetes Coordinator Pager [phone] (8a-5p)

## 2016-11-20 NOTE — Discharge Summary (Signed)
Physician Discharge Summary       [address]       [place] [address]             [phone]    Patient [name] MRN: [id number] DOB/AGE: 1955/09/15 61 y.o.  Admit date: 11/11/2016 Discharge date: 11/21/2016  Admission Diagnoses: Coronary artery disease-BMS LAD and PTCA RCA 2000,BMS RCA/PLA 2001  Active Diagnoses:  1.HTN (hypertension) 2.Hyperlipidemia 3.MI (myocardial infarction, 01') ([place]) 4.Type 2 diabetes mellitus ([place]) 5.Lupus anticoagulant positive 6.Low-grade NHL (non-Hodgkin's lymphoma) 7.GERD (gastroesophageal reflux disease) 8.Anxiety 9.Arthritis 10.Gastric ulcer 11. Remote tobacco abuse 12. ABL anemia 13. Post op atrial fibrillation  Procedure (s):  1. Coronary artery bypass grafting x3 (left IMA to LAD, saphenous vein graft to OM2, saphenous vein graft to posterior descending). 2. Endoscopic harvest of right leg greater saphenous vein by Dr. Prescott Gum on 11/11/2016.  History of Presenting Illness: This is a 61 year old morbidly obese male with recently diagnosed severe three-vessel coronary disease and normal LV function. Patient had cardiac evaluation in preparation for possible gastric bypass surgery--the patient weighs 350 pounds.  After the initial consultation, the patient has undergone bilateral lower extremity vein mapping which shows adequate vein ,3-4 millimeter diameter,  as conduit for coronary bypass grafting  Patient has undergone PFTs which are adequate showing diffusion capacity  50 and diffusion, FVC 3.5 and FEV1 2.9 [65% predicted]  The patient has fatty liver with some evidence of cirrhosis. He was seen by his medicine care provider and repeat ultrasound of the liver showed no evidence of his significant cirrhosis. Previous LFTs have been normal. I checked a ammonia level which was mildly elevated at 55.   We will proceed with multivessel CABG later in the month, as surgery scheduled for June 26. The patient  is planning on pursuing gastric bypass surgery after he he recovers some heart surgery.   Dr. Prescott Gum discussed indications benefits of surgery with the patient. Patient  understands due to his excessive weight, 350 pounds, that he is at increased risk for pulmonary problems, wound infections, organ failure, and death. Patient agrees to proceed with surgery. Pre operative carotid duplex showed no significant internal carotid artery stenosis bilaterally. He underwent a CABG x 3 on 11/11/2016.  Brief Hospital Course:  The patient was extubated the morning of post operative day one without difficulty. He did require Bipap then NRB but was later weaned to nasal cannula. He remained afebrile and hemodynamically stable. He was put on Fortaz for possibly pulmonary infection. This was continued for one week then stopped. He was weaned off of Milrinone (co ox was monitored) and Dopamine drips. Gordy Councilman, a line, chest tubes, and foley were removed early in the post operative course. He went into atrial fibrillation and he was started on Amiodarone. He remained in a fib with a controlled ventricular rate. He was on Lovenox SQ daily. Per Dr. Prescott Gum, he will be started on Eliquis at discharge. Lopressor was started and titrated accordingly. He did remain NPO for a few days due to high risk for aspiration. Speech pathology was consulted and recommendations were followed accordingly. He has OSA and was put on CPAP at night. He was volume over loaded and diuresed. He had ABL anemia. He did not require a post op transfusion. Last H and H was 9.4 and 29.4. His creatinine did increase post op, but later returned to his baseline (1.3). He was weaned off the insulin drip.  Once he was  tolerating a diet and creatinine decreased, Metformin was restarted and Insulin was increased as needed.  The patient's glucose remained well controlled.The patient's HGA1C pre op was 8.4. Patient will need close follow up with medical doctor  after discharge. The patient was felt surgically stable for transfer from the ICU to PCTU for further convalescence on 11/18/2016. He continues to progress with cardiac rehab. He is ambulating on 4 liters of oxygen via [place]. We will attempt to wean him to room air. He has been tolerating a diet and has had a bowel movement. Epicardial pacing wires were removed on 11/18/2016. His right EVH thigh wound dehisced and there was serous drainage. Per Dr. Prescott Gum, Keflex was started empirically. Chest tube sutures will be removed the day of discharge. The patient is felt surgically stable for discharge today.  Latest Vital Signs: Blood pressure (!) 98/49, pulse (!) 59, temperature 98.4 F (36.9 C), temperature source Oral, resp. rate 18, height 6\' 3"  (1.905 m), weight (!) 351 lb 6.6 oz (159.4 kg), SpO2 96 %.  Physical Exam: Cardiovascular: IRRR IRRR Pulmonary: Diminished at bases Abdomen: Soft, non tender, obese,bowel sounds present. Extremities: Bilateral lower extremity edema. Wounds: Clean and dry.  No erythema or signs of infection. Right thigh wound dehisced secondary to LE edema. Serours drainage-no sign of infection.  Discharge Condition: Stable and discharged to home.  Recent laboratory studies:  Lab Results  Component Value Date   WBC 9.7 11/19/2016   HGB 9.4 (L) 11/19/2016   HCT 29.4 (L) 11/19/2016   MCV 99.3 11/19/2016   PLT 170 11/19/2016   Lab Results  Component Value Date   NA 134 (L) 11/20/2016   K 3.5 11/20/2016   CL 90 (L) 11/20/2016   CO2 34 (H) 11/20/2016   CREATININE 1.30 (H) 11/20/2016   GLUCOSE 192 (H) 11/20/2016    Diagnostic Studies: Dg Chest 2 View  Result Date: 11/19/2016 CLINICAL DATA:  Postop CABG EXAM: CHEST  2 VIEW COMPARISON:  11/18/2016 FINDINGS: Prior CABG. Right PICC line is unchanged. Cardiomegaly. Diffuse interstitial and alveolar opacities, likely edema, worsened since prior study. Small effusions. No pneumothorax. IMPRESSION: Worsening interstitial and  alveolar opacities, likely edema. Small effusions. Electronically Signed   By: Rolm Baptise M.D.   On: 11/19/2016 09:38   Discharge Medications: Allergies as of 11/21/2016      Reactions   No Known Allergies       Medication List    STOP taking these medications   isosorbide mononitrate 30 MG 24 hr tablet Commonly known as:  IMDUR   losartan 100 MG tablet Commonly known as:  COZAAR   spironolactone 50 MG tablet Commonly known as:  ALDACTONE     TAKE these medications   amiodarone 200 MG tablet Commonly known as:  PACERONE Take 1 tablet (200 mg total) by mouth daily.   apixaban 5 MG Tabs tablet Commonly known as:  ELIQUIS Take 1 tablet (5 mg total) by mouth 2 (two) times daily.   aspirin EC 81 MG tablet Take 81 mg by mouth daily.   BASAGLAR KWIKPEN 100 UNIT/ML Sopn INJECT 80 UNITS INTO THE SKIN DAILY AT 10 PM   cephALEXin 500 MG capsule Commonly known as:  KEFLEX Take 1 capsule (500 mg total) by mouth every 8 (eight) hours. For 7 Days   cholecalciferol 1000 units tablet Commonly known as:  VITAMIN D Take 1,000 Units by mouth daily.   ferrous sulfate 325 (65 FE) MG tablet Take 1 tablet (325 mg total) by  mouth daily with breakfast.   FLUoxetine 20 MG capsule Commonly known as:  PROZAC Take 20 mg by mouth daily.   folic acid 1 MG tablet Commonly known as:  FOLVITE Take 1 tablet (1 mg total) by mouth daily.   furosemide 40 MG tablet Commonly known as:  LASIX Take 1 tablet (40 mg total) by mouth daily.   gabapentin 300 MG capsule Commonly known as:  NEURONTIN Take 300 mg by mouth at bedtime.   insulin aspart 100 UNIT/ML FlexPen Commonly known as:  NOVOLOG FLEXPEN Inject 18-24 Units into the skin 3 (three) times daily with meals.   metFORMIN 500 MG tablet Commonly known as:  GLUCOPHAGE Take 1 tablet (500 mg total) by mouth 2 (two) times daily with a meal.   metoprolol tartrate 50 MG tablet Commonly known as:  LOPRESSOR Take 25 mg by mouth 2 (two) times  daily.   nitroGLYCERIN 0.4 MG SL tablet Commonly known as:  NITROSTAT Place 1 tablet (0.4 mg total) under the tongue every 5 (five) minutes as needed.   ONETOUCH VERIO test strip Generic drug:  glucose blood USE FOUR TIMES DAILY AS DIRECTED   oxyCODONE 15 MG immediate release tablet Commonly known as:  ROXICODONE Take 15 mg by mouth every 4 (four) hours as needed for pain.   pantoprazole 40 MG tablet Commonly known as:  PROTONIX TAKE 1 TABLET BY MOUTH BEFORE BREAKFAST   Potassium Chloride ER 20 MEQ Tbcr Take 20 mEq by mouth daily.   simvastatin 10 MG tablet Commonly known as:  ZOCOR TAKE 1 TABLET BY MOUTH EVERY NIGHT AT BEDTIME   tamsulosin 0.4 MG Caps capsule Commonly known as:  FLOMAX Take 1 capsule by mouth every evening.   VICTOZA 18 MG/3ML Sopn Generic drug:  liraglutide ADMINISTER 1.8 MG UNDER THE SKIN EVERY DAY   zolpidem 10 MG tablet Commonly known as:  AMBIEN Take 10 mg by mouth at bedtime as needed for sleep.      The patient has been discharged on:   1.Beta Blocker:  Yes [  x ]                              No   [   ]                              If No, reason:  2.Ace Inhibitor/ARB: Yes [   ]                                     No  [x  ]                                     If No, reason: labile BP  3.Statin:   Yes [  x ]                  No  [   ]                  If No, reason:  4.Ecasa:  Yes  [ x  ]                  No   [   ]  If No, reason:  Follow Up Appointments: Follow-up Information    Ivin Poot, MD Follow up on 12/24/2016.   Specialty:  Cardiothoracic Surgery Why:  PA/LAT CXR to be taken (at [facility] which is in the same building as Dr. Lucianne Lei Trigt's office on 12/24/2016 at 11:00 am;Appointment time is at 11:30 am Contact information: [place] [address] [phone]        Sinda Du, MD Follow up.   Specialty:  Pulmonary Disease Why:  Call for a follow up  appointment for 1-2 weeks regarding further diabetes management and surveillance of HGA1C 8.4 Contact information: [address] [place] [address] [phone]        Lendon Colonel, NP Follow up on 12/09/2016.   Specialties:  Nurse Practitioner, Radiology, Cardiology Why:  Appointment time is at 2:00 pm Contact information: [place] [address] [phone]        [facility] Follow up on 11/24/2016.   Specialty:  Cardiothoracic Surgery Why:  Appointment is at 3:15 for wound check Contact information: [place], [place] [phone]          Signed: Cinda Quest 11/21/2016, 8:38 AM

## 2016-11-20 NOTE — Progress Notes (Signed)
Dry gauze dressings changed on sternal wound and right leg wound without difficulty.  Will continue to monitor.

## 2016-11-20 NOTE — Progress Notes (Signed)
CARDIAC REHAB PHASE I   PRE:  Rate/Rhythm: 82 afib  BP:  Supine:   Sitting: 112/72  Standing:    SaO2: 99% 3L  MODE:  Ambulation: 350 ft   POST:  Rate/Rhythm: 101 afib  BP:  Supine:   Sitting: 128/63  Standing:    SaO2: 91-93% 2L  Hall and room [phone] Assisted to bathroom for BM and then pt walked 350 ft on 2L with rolling walker and asst x 2.. Checked sats several times and above 90% on 2L. Pt does a good job rocking to stand so little assistance needed. To recliner with call bell. Left on 2L and encouraged IS.    Graylon Good, RN BSN  11/20/2016 8:55 AM

## 2016-11-20 NOTE — Progress Notes (Signed)
After speaking with Dr. Prescott Gum, he has decided to use Keflex so I stopped the Bactrim.

## 2016-11-20 NOTE — Progress Notes (Addendum)
      [address]       [place] [address]             [phone]        9 Days Post-Op Procedure(s) (LRB): CORONARY ARTERY BYPASS GRAFTING (CABG) x 3, USING LEFT MAMMARY ARTERY AND RIGHT GREATER SAPHENOUS VEIN HARVESTED ENDOSCOPICALLY (N/A) TRANSESOPHAGEAL ECHOCARDIOGRAM (TEE) (N/A)  Subjective: Patient without complaints this am.   Objective: Vital signs in last 24 hours: Temp:  [96.9 F (36.1 C)-98.3 F (36.8 C)] 98.3 F (36.8 C) (07/05 0420) Pulse Rate:  [66-102] 66 (07/05 0420) Cardiac Rhythm: Atrial fibrillation (07/05 0700) Resp:  [18-20] 20 (07/05 0420) BP: (81-129)/(42-83) 98/55 (07/05 0420) SpO2:  [98 %-100 %] 99 % (07/05 0420)  Pre op weight 155.1 kg Current Weight  11/19/16 (!) 158 kg (348 lb 6.4 oz)      Intake/Output from previous day: 07/04 0701 - 07/05 0700 In: 2301 [P.O.:1641; I.V.:510; IV Piggyback:150] Out: 975 [Urine:975]   Physical Exam:  Cardiovascular: IRRR IRRR Pulmonary: Diminished at bases Abdomen: Soft, non tender, obese,bowel sounds present. Extremities: Bilateral lower extremity edema. Wounds: Clean and dry.  No erythema or signs of infection. Right thigh wound dehisced secondary to LE edema. Serours drainage-no sign of infection.  Lab Results: CBC:  Recent Labs  11/18/16 0430 11/19/16 0407  WBC 9.1 9.7  HGB 9.2* 9.4*  HCT 28.5* 29.4*  PLT 164 170   BMET:   Recent Labs  11/19/16 0407 11/20/16 0450  NA 134* 134*  K 3.3* 3.5  CL 92* 90*  CO2 33* 34*  GLUCOSE 82 192*  BUN 33* 31*  CREATININE 1.29* 1.30*  CALCIUM 8.8* 8.8*    PT/INR:  Lab Results  Component Value Date   INR 1.26 11/11/2016   INR 1.05 11/07/2016   INR 1.1 10/06/2016   ABG:  INR: Will add last result for INR, ABG once components are confirmed Will add last 4 CBG results once components are confirmed  Assessment/Plan:  1. CV - A fib with CVR. On Amiodarone 400 mg daily and Lopressor 25 mg bid. Will decrease Amiodarone  and give parameters for Lopressor. Per Dr. Prescott Gum, start Eliquis at discharge for a fib. 2.  Pulmonary - On 4 liters of oxygen via [place]. Wean to room air as able. Encourage incentive spirometer. CXR ordered but not taken yet.  3. Volume Overload - On Lasix 40 mg IV daily. 4.  Acute blood loss anemia - H and H stable at 9.4 and 29.4. Start Ferrous sulfate and folic acid. 5. OSA-patient refusing CPAP 6. Supplement potassium 7. DM-CBGs 289/335/181. On Insulin only. Will restart on Metformin as taken pre op to help with control of glucose. Closely monitor glucose and consider increasing Metformin if not well controlled.  Pre op HGA1C 8.4. He will need close medical follow up after discharge. 8. Creatinine stable at 1.3 (admission creatinine 1.33) 9. ID-was on Zosyn for almost one week. Per Dr. Prescott Gum, start oral Levaquin for right EVH wound dehiscence and drainage 10. Remove EPW  11. Hopefully, discharge Friday  ZIMMERMAN,DONIELLE MPA-C 11/20/2016,7:50 AM  Wean oxygen as tolerated Stop IV antibiotics and transitioned to oral Keflex for a open leg wound and persistent sternal drainage with erythema Remove pacing wires Probably home this weekend on Eliquis for postop atrial fibrillation

## 2016-11-20 NOTE — Progress Notes (Signed)
Physical Therapy Treatment Patient Details Name: Kelly [name] MRN: [id number] DOB: Jun 29, 1955 Today's Date: 11/20/2016    History of Present Illness Pt admit for CABG x 3.  PMH:  anxiety, CAD, DM, HTN, MI, lupus, prior stents    PT Comments    Patient is progressing well toward mobility goals and overall min guard/supervision. SpO2 desat with ambulation to 84-86% on RA. 2L O2 via [place] donned end of session. RN aware. Try SPC next session. D/C plan updated to HHPT for give pt's progression.    Follow Up Recommendations  Home health PT     Equipment Recommendations  Other (comment) (TBA (SPC vs RW))    Recommendations for Other Services       Precautions / Restrictions Precautions Precautions: Fall;Sternal Precaution Comments: pt able to state sternal precautions    Mobility  Bed Mobility Overal bed mobility: Needs Assistance Bed Mobility: Rolling;Sidelying to Sit Rolling: Supervision Sidelying to sit: Min guard       General bed mobility comments: in chair on arrival   Transfers Overall transfer level: Needs assistance Equipment used: Rolling walker (2 wheeled) Transfers: Sit to/from Stand Sit to Stand: Min guard         General transfer comment: uses momentum and heart pillow; min guard for safety  Ambulation/Gait Ambulation/Gait assistance: Supervision Ambulation Distance (Feet): 300 Feet Assistive device: Rolling walker (2 wheeled) Gait Pattern/deviations: Step-through pattern;Decreased stride length;Wide base of support Gait velocity: decreased   General Gait Details: SpO2 desat on RA with ambulation=84-86%; cues for pursed lip breathing and one standing rest break; 1/4 DOE   Stairs            Wheelchair Mobility    Modified Rankin (Stroke Patients Only)       Balance     Sitting balance-Leahy Scale: Good       Standing balance-Leahy Scale: Fair Standing balance comment: able to static stand without UE support                            Cognition Arousal/Alertness: Awake/alert Behavior During Therapy: WFL for tasks assessed/performed Overall Cognitive Status: Within Functional Limits for tasks assessed                                        Exercises      General Comments General comments (skin integrity, edema, etc.): pt on 2L O2 via [place] end of session to maintain SpO2 90%      Pertinent Vitals/Pain Pain Assessment: No/denies pain    Home Living                      Prior Function            PT Goals (current goals can now be found in the care plan section) Acute Rehab PT Goals Patient Stated Goal: to get better Progress towards PT goals: Progressing toward goals    Frequency    Min 3X/week      PT Plan Current plan remains appropriate    Co-evaluation              AM-PAC PT "6 Clicks" Daily Activity  Outcome Measure  Difficulty turning over in bed (including adjusting bedclothes, sheets and blankets)?: A Little Difficulty moving from lying on back to sitting on the side of the bed? : A Little  Difficulty sitting down on and standing up from a chair with arms (e.g., wheelchair, bedside commode, etc,.)?: A Little Help needed moving to and from a bed to chair (including a wheelchair)?: A Little Help needed walking in hospital room?: A Little Help needed climbing 3-5 steps with a railing? : A Lot 6 Click Score: 17    End of Session Equipment Utilized During Treatment: Gait belt;Oxygen Activity Tolerance: Patient tolerated treatment well Patient left: in bed;with call bell/phone within reach;with family/visitor present (sitting EOB ) Nurse Communication: Mobility status (O2 desat) PT Visit Diagnosis: Muscle weakness (generalized) (M62.81);Unsteadiness on feet (R26.81);Pain     Time: 6073-7106 PT Time Calculation (min) (ACUTE ONLY): 21 min  Charges:  $Gait Training: 8-22 mins                    G Codes:       Earney Navy, PTA Pager:  [phone]     Darliss Cheney 11/20/2016, 4:39 PM

## 2016-11-20 NOTE — Progress Notes (Signed)
Dressing on right leg open incision replaced again due to serosanguinous saturation.

## 2016-11-21 LAB — GLUCOSE, CAPILLARY
Glucose-Capillary: 99 mg/dL (ref 65–99)
Glucose-Capillary: 211 mg/dL — ABNORMAL HIGH (ref 65–99)

## 2016-11-21 MED ORDER — FUROSEMIDE 40 MG PO TABS: 40.0000 mg | ORAL_TABLET | Freq: Every day | ORAL | 11 refills | Status: DC

## 2016-11-21 MED ORDER — FERROUS SULFATE 325 (65 FE) MG PO TABS: 325.0000 mg | ORAL_TABLET | Freq: Every day | ORAL | 0 refills | Status: DC

## 2016-11-21 MED ORDER — POTASSIUM CHLORIDE ER 20 MEQ PO TBCR: 20.0000 meq | EXTENDED_RELEASE_TABLET | Freq: Every day | ORAL | 1 refills | Status: DC

## 2016-11-21 MED ORDER — APIXABAN 5 MG PO TABS: 5.0000 mg | ORAL_TABLET | Freq: Two times a day (BID) | ORAL | 3 refills | Status: DC

## 2016-11-21 MED ORDER — AMIODARONE HCL 200 MG PO TABS: 200.0000 mg | ORAL_TABLET | Freq: Every day | ORAL | 1 refills | Status: DC

## 2016-11-21 MED ORDER — OXYCODONE HCL 15 MG PO TABS: 15.0000 mg | ORAL_TABLET | ORAL | 0 refills | Status: DC | PRN

## 2016-11-21 MED ORDER — CEPHALEXIN 500 MG PO CAPS: 500.0000 mg | ORAL_CAPSULE | Freq: Three times a day (TID) | ORAL | 0 refills | Status: DC

## 2016-11-21 MED ORDER — FOLIC ACID 1 MG PO TABS: 1.0000 mg | ORAL_TABLET | Freq: Every day | ORAL | 0 refills | Status: DC

## 2016-11-21 NOTE — Discharge Instructions (Signed)
Right Leg Wound-- please provide clean and dry dressing changes as needed    Coronary Artery Bypass Grafting, Care After This sheet gives you information about how to care for yourself after your procedure. Your health care provider may also give you more specific instructions. If you have problems or questions, contact your health care provider. What can I expect after the procedure? After the procedure, it is common to have:  Nausea and a lack of appetite.  Constipation.  Weakness and fatigue.  Depression or irritability.  Pain or discomfort in your incision areas.  Follow these instructions at home: Medicines  Take over-the-counter and prescription medicines only as told by your health care provider. Do not stop taking medicines or start any new medicines without approval from your health care provider.  If you were prescribed an antibiotic medicine, take it as told by your health care provider. Do not stop taking the antibiotic even if you start to feel better.  Do not drive or use heavy machinery while taking prescription pain medicine. Incision care  Follow instructions from your health care provider about how to take care of your incisions. Make sure you: ? Wash your hands with soap and water before you change your bandage (dressing). If soap and water are not available, use hand sanitizer. ? Change your dressing as told by your health care provider. ? Leave stitches (sutures), skin glue, or adhesive strips in place. These skin closures may need to stay in place for 2 weeks or longer. If adhesive strip edges start to loosen and curl up, you may trim the loose edges. Do not remove adhesive strips completely unless your health care provider tells you to do that.  Keep incision areas clean, dry, and protected.  Check your incision areas every day for signs of infection. Check for: ? More redness, swelling, or pain. ? More fluid or blood. ? Warmth. ? Pus or a bad  smell.  If incisions were made in your legs: ? Avoid crossing your legs. ? Avoid sitting for long periods of time. Change positions every 30 minutes. ? Raise (elevate) your legs when you are sitting. Bathing  Do not take baths, swim, or use a hot tub until your health care provider approves.  Only take sponge baths. Pat the incisions dry. Do not rub incisions with a washcloth or towel.  Ask your health care provider when you can shower. Eating and drinking  Eat foods that are high in fiber, such as raw fruits and vegetables, whole grains, beans, and nuts. Meats should be lean cut. Avoid canned, processed, and fried foods. This can help prevent constipation and is a recommended part of a heart-healthy diet.  Drink enough fluid to keep your urine clear or pale yellow.  Limit alcohol intake to no more than 1 drink a day for nonpregnant women and 2 drinks a day for men. One drink equals 12 oz of beer, 5 oz of wine, or 1 oz of hard liquor. Activity  Rest and limit your activity as told by your health care provider. You may be instructed to: ? Stop any activity right away if you have chest pain, shortness of breath, irregular heartbeats, or dizziness. Get help right away if you have any of these symptoms. ? Move around frequently for short periods or take short walks as directed by your health care provider. Gradually increase your activities. You may need physical therapy or cardiac rehabilitation to help strengthen your muscles and build your endurance. ? Avoid  lifting, pushing, or pulling anything that is heavier than 10 lb (4.5 kg) for at least 6 weeks or as told by your health care provider.  Do not drive until your health care provider approves.  Ask your health care provider when you may return to work.  Ask your health care provider when you may resume sexual activity. General instructions  Do not use any products that contain nicotine or tobacco, such as cigarettes and  e-cigarettes. If you need help quitting, ask your health care provider.  Take 2-3 deep breaths every few hours during the day, while you recover. This helps expand your lungs and prevent complications like pneumonia after surgery.  If you were given a device called an incentive spirometer, use it several times a day to practice deep breathing. Support your chest with a pillow or your arms when you take deep breaths or cough.  Wear compression stockings as told by your health care provider. These stockings help to prevent blood clots and reduce swelling in your legs.  Weigh yourself every day. This helps identify if your body is holding (retaining) fluid that may make your heart and lungs work harder.  Keep all follow-up visits as told by your health care provider. This is important. Contact a health care provider if:  You have more redness, swelling, or pain around any incision.  You have more fluid or blood coming from any incision.  Any incision feels warm to the touch.  You have pus or a bad smell coming from any incision  You have a fever.  You have swelling in your ankles or legs.  You have pain in your legs.  You gain 2 lb (0.9 kg) or more a day.  You are nauseous or you vomit.  You have diarrhea. Get help right away if:  You have chest pain that spreads to your jaw or arms.  You are short of breath.  You have a fast or irregular heartbeat.  You notice a "clicking" in your breastbone (sternum) when you move.  You have numbness or weakness in your arms or legs.  You feel dizzy or light-headed. Summary  After the procedure, it is common to have pain or discomfort in the incision areas.  Do not take baths, swim, or use a hot tub until your health care provider approves.  Gradually increase your activities. You may need physical therapy or cardiac rehabilitation to help strengthen your muscles and build your endurance.  Weigh yourself every day. This helps  identify if your body is holding (retaining) fluid that may make your heart and lungs work harder. This information is not intended to replace advice given to you by your health care provider. Make sure you discuss any questions you have with your health care provider. Document Released: 11/22/2004 Document Revised: 03/24/2016 Document Reviewed: 03/24/2016 Elsevier Interactive Patient Education  [employer].

## 2016-11-21 NOTE — Progress Notes (Signed)
CARDIAC REHAB PHASE I   PRE:  Rate/Rhythm: 70 afib     SaO2: 92%RA  MODE:  Ambulation: 350 ft   POST:  Rate/Rhythm: 93 afib  BP:  Supine:   Sitting: 107/71  Standing:    SaO2: monitored whole walk 89-92 %RA 1011-1100 Monitored sats during walk and pt walked independently with rolling walker. He denied SOB. Sats maintained above 89% consistently. Would recommend rolling walker for home use. Pt was give Off the Beat booklet due to a fib. Discussed watching carbs, ex ed , sternal precautions, IS and CRP 2. Will refer to CRP 2 [place].   Graylon Good, RN BSN  11/21/2016 10:54 AM

## 2016-11-21 NOTE — Progress Notes (Signed)
      [address]       [place] [address]             [phone]      10 Days Post-Op Procedure(s) (LRB): CORONARY ARTERY BYPASS GRAFTING (CABG) x 3, USING LEFT MAMMARY ARTERY AND RIGHT GREATER SAPHENOUS VEIN HARVESTED ENDOSCOPICALLY (N/A) TRANSESOPHAGEAL ECHOCARDIOGRAM (TEE) (N/A)   Subjective:  Patient feels great.  Wants to go home today.  + ambulation   + BM  Objective: Vital signs in last 24 hours: Temp:  [97.7 F (36.5 C)-98.4 F (36.9 C)] 98.4 F (36.9 C) (07/06 0523) Pulse Rate:  [59-98] 59 (07/06 0523) Cardiac Rhythm: Atrial fibrillation;Bundle branch block (07/06 0700) Resp:  [17-18] 18 (07/06 0523) BP: (98-119)/(49-57) 98/49 (07/06 0523) SpO2:  [90 %-96 %] 96 % (07/06 0523) Weight:  [351 lb 6.6 oz (159.4 kg)] 351 lb 6.6 oz (159.4 kg) (07/06 0523)  Intake/Output from previous day: 07/05 0701 - 07/06 0700 In: 720 [P.O.:720] Out: 900 [Urine:900]  General appearance: alert, cooperative and no distress Heart: regular rate and rhythm Lungs: clear to auscultation bilaterally Abdomen: soft, non-tender; bowel sounds normal; no masses,  no organomegaly Extremities: edema trace Wound: EVH site mildly dehisced no infection present, sternal wound clean and dry, no erythema or drainage present  Lab Results:  Recent Labs  11/19/16 0407  WBC 9.7  HGB 9.4*  HCT 29.4*  PLT 170   BMET:  Recent Labs  11/19/16 0407 11/20/16 0450  NA 134* 134*  K 3.3* 3.5  CL 92* 90*  CO2 33* 34*  GLUCOSE 82 192*  BUN 33* 31*  CREATININE 1.29* 1.30*  CALCIUM 8.8* 8.8*    PT/INR: No results for input(s): LABPROT, INR in the last 72 hours. ABG    Component Value Date/Time   PHART 7.407 11/14/2016 0334   HCO3 24.9 11/14/2016 0334   TCO2 35 11/17/2016 1828   ACIDBASEDEF 3.0 (H) 11/13/2016 1600   O2SAT 64.3 11/18/2016 0430   CBG (last 3)   Recent Labs  11/20/16 1111 11/20/16 1623 11/21/16 0636  GLUCAP 251* 175* 211*    Assessment/Plan: S/P  Procedure(s) (LRB): CORONARY ARTERY BYPASS GRAFTING (CABG) x 3, USING LEFT MAMMARY ARTERY AND RIGHT GREATER SAPHENOUS VEIN HARVESTED ENDOSCOPICALLY (N/A) TRANSESOPHAGEAL ECHOCARDIOGRAM (TEE) (N/A)  1. CV- A. Fib with CVR-- on Amiodarone and Lopressor.. Will restart Eliquis 2. Pulm- off oxygen, continue IS 3. Renal- weight has been trending down, will transition to oral Lasix for discharge 4. ID- on Keflex for ?wound infections, EVH site is mildly dehisced with no purulence/erythema noted, mild serous drainage, no sternal drainage present or erythema present 5. Dm- cbgs improved some with addition of Metformin, was uncontrolled preoperatively.. Will need weight loss and better dietary management 6. Dispo- patient stable, wants to go home today, wounds are stable, discussed with PVT, ok to discharge home today   LOS: 10 days    Kelly [name], Kelly [name] 11/21/2016

## 2016-11-21 NOTE — Care Management Note (Signed)
Case Management Note  Patient Details  Name: Kelly [name] MRN: [id number] Date of Birth: 1955/08/14  Subjective/Objective:              Spoke with patient and wife at the bedside. Patient will require [facility] PT prior to Cardiac Rehab. Family would like to use [facility]. Referral made      Action/Plan:   Expected Discharge Date:  11/21/16               Expected Discharge Plan:  [place]  In-House Referral:     Discharge planning Services  CM Consult  Post Acute Care Choice:  Home Health Choice offered to:  Patient  DME Arranged:    DME Agency:     HH Arranged:  PT [place]:  [place]  Status of Service:  Completed, signed off  If discussed at [place] of Stay Meetings, dates discussed:    Additional Comments:  Carles Collet, RN 11/21/2016, 10:59 AM

## 2016-11-21 NOTE — Progress Notes (Signed)
Patient in a stable condition, discharge education reviwed with patient, he verbalis ed understanding, paper prescriptions given to patient , picc dc by iv team, patient belongings at bedside, patient awaiting his walker to be delivered to his room and for his wife to transport him  home

## 2016-11-21 NOTE — Progress Notes (Addendum)
Benefit check sent for Eliquis. Private insurance, patient received 1 month and 1 year copay cards. Verbalized understanding for use.  Jacqualin Combes, RN        # 1. S/W  [facility] @ [facility] # [id number]   1. ELIQUIS  5 MG BID  COVER- YES  CO-PAY- $ 30.00  PRIOR APPROVAL- NO   2. ELIQUIS 2.5 MG BID  COVER- YES  SAME AS ABOVE   APIXABAN - NONE FORMULARY   PREFERRED PHARMACY : [facility] AND [facility]

## 2016-11-21 NOTE — Progress Notes (Signed)
Physical Therapy Treatment Patient Details Name: Kelly [name]  MRN: [id number] DOB: 08-Sep-1955 Today's Date: 11/21/2016    History of Present Illness Pt admit for CABG x 3.  PMH:  anxiety, CAD, DM, HTN, MI, lupus, prior stents    PT Comments    Pt progressing towards goals. Pt had just worked with cardiac rehab, therefore tolerance for activity limited. Practiced ambulation with SPC; slightly decreased balance, however, no overt LOB noted. Pt reports he feels safer with use of RW, especially for longer distances, therefore will need RW at d/c. Oxygen sats decreased to 88% on RA during ambulation, however, elevated back to 91%-92% on RA with seated rest and cues for pursed lip breathing. Educated about use of incentive spirometry and importance of pursed lip breathing during ambulation. Current recommendations appropriate. Will continue to follow acutely to maximize functional mobility independence.    Follow Up Recommendations  Home health PT     Equipment Recommendations  Rolling walker with 5" wheels    Recommendations for Other Services       Precautions / Restrictions Precautions Precautions: Fall;Sternal Precaution Comments: Pt able to state partial sternal precautions. Required verbal cues for no shoulder flexion and no horizontal abduction.  Restrictions Weight Bearing Restrictions: Yes (sternal precautions )    Mobility  Bed Mobility               General bed mobility comments: In chair upon entry   Transfers Overall transfer level: Needs assistance Equipment used: None Transfers: Sit to/from Stand Sit to Stand: Supervision         General transfer comment: uses momentum (rocked X 4) and heart pillow; supervision for safety  Ambulation/Gait Ambulation/Gait assistance: Min guard Ambulation Distance (Feet): 150 Feet Assistive device: Straight cane Gait Pattern/deviations: Step-through pattern;Decreased stride length;Wide base of support Gait velocity:  decreased Gait velocity interpretation: Below normal speed for age/gender General Gait Details: Pt had just ambulated with cardiac rehab, therefore requesting shorter distance. Practiced with SPC for short distance. Cues for sequencing. Min guard for safety; no overt LOB. Pt reports he feels safer with use of RW for longer distances, therefore, will need RW at d/c. Oxygen sats decreased to 88% on RA during ambulation; with seated rest and cues for pursed lip breathing, increased to 92%.   Stairs            Wheelchair Mobility    Modified Rankin (Stroke Patients Only)       Balance Overall balance assessment: Needs assistance;History of Falls Sitting-balance support: No upper extremity supported;Feet supported Sitting balance-Leahy Scale: Good     Standing balance support: During functional activity;Single extremity supported Standing balance-Leahy Scale: Fair Standing balance comment: able to static stand without UE support                            Cognition Arousal/Alertness: Awake/alert Behavior During Therapy: WFL for tasks assessed/performed Overall Cognitive Status: Within Functional Limits for tasks assessed                                        Exercises      General Comments General comments (skin integrity, edema, etc.): Educated about importance of pursed lip breathing and for use of incentive spirometry to ensure oxygen sats are maintained during mobility. Pt stating he forgets to breathe deep, and will make effort to  perform incentive spirometry for 10 reps at least 1 time an hour.       Pertinent Vitals/Pain Pain Assessment: No/denies pain    Home Living                      Prior Function            PT Goals (current goals can now be found in the care plan section) Acute Rehab PT Goals Patient Stated Goal: to get better PT Goal Formulation: With patient Time For Goal Achievement: 11/28/16 Potential to  Achieve Goals: Good Progress towards PT goals: Progressing toward goals    Frequency    Min 3X/week      PT Plan Current plan remains appropriate    Co-evaluation              AM-PAC PT "6 Clicks" Daily Activity  Outcome Measure  Difficulty turning over in bed (including adjusting bedclothes, sheets and blankets)?: A Little Difficulty moving from lying on back to sitting on the side of the bed? : A Little Difficulty sitting down on and standing up from a chair with arms (e.g., wheelchair, bedside commode, etc,.)?: A Little Help needed moving to and from a bed to chair (including a wheelchair)?: A Little Help needed walking in hospital room?: A Little Help needed climbing 3-5 steps with a railing? : A Lot 6 Click Score: 17    End of Session Equipment Utilized During Treatment: Gait belt Activity Tolerance: Patient tolerated treatment well Patient left: in chair;with call bell/phone within reach Nurse Communication: Mobility status;Other (comment) (oxygen levels during mobility. ) PT Visit Diagnosis: Muscle weakness (generalized) (M62.81);Unsteadiness on feet (R26.81)     Time: 4982-6415 PT Time Calculation (min) (ACUTE ONLY): 13 min  Charges:  $Gait Training: 8-22 mins                    G Codes:       Leighton Ruff, PT, DPT  Acute Rehabilitation Services  Pager: [phone]    Rudean Hitt 11/21/2016, 11:46 AM

## 2016-11-21 NOTE — Progress Notes (Signed)
Sutures removed as oredered

## 2016-11-22 DIAGNOSIS — Z48812 Encounter for surgical aftercare following surgery on the circulatory system: Secondary | ICD-10-CM | POA: Diagnosis not present

## 2016-11-22 LAB — GLUCOSE, CAPILLARY: Glucose-Capillary: 188 mg/dL — ABNORMAL HIGH (ref 65–99)

## 2016-11-24 ENCOUNTER — Ambulatory Visit: Payer: Self-pay

## 2016-11-25 ENCOUNTER — Ambulatory Visit (INDEPENDENT_AMBULATORY_CARE_PROVIDER_SITE_OTHER): Payer: Self-pay | Admitting: Surgical

## 2016-11-25 VITALS — BP 116/68 | HR 92 | Resp 20 | Ht 75.0 in | Wt 343.0 lb

## 2016-11-25 DIAGNOSIS — I2510 Atherosclerotic heart disease of native coronary artery without angina pectoris: Secondary | ICD-10-CM

## 2016-11-25 DIAGNOSIS — Z4889 Encounter for other specified surgical aftercare: Secondary | ICD-10-CM

## 2016-11-25 DIAGNOSIS — T8130XA Disruption of wound, unspecified, initial encounter: Secondary | ICD-10-CM

## 2016-11-25 DIAGNOSIS — Z951 Presence of aortocoronary bypass graft: Secondary | ICD-10-CM

## 2016-11-25 NOTE — Progress Notes (Addendum)
[address]       [place] [address]             [phone]      [facility] Medical Record #[id number] Date of Birth: 07-25-1955  Referring: Satira Sark, MD Primary Care: Sinda Du, MD  Chief Complaint:   POST OP FOLLOW UP DATE OF PROCEDURE:  11/11/2016 DATE OF DISCHARGE:                              OPERATIVE REPORT   OPERATION: 1. Coronary artery bypass grafting x3 (left IMA to LAD, saphenous vein     graft to OM2, saphenous vein graft to posterior descending). 2. Endoscopic harvest of right leg greater saphenous vein.  PREOPERATIVE DIAGNOSES:  Severe 3-vessel coronary artery disease, class III progressive angina, diabetes mellitus.  POSTOPERATIVE DIAGNOSES:  Severe 3-vessel coronary artery disease, class III progressive angina, diabetes mellitus.  SURGEON:  Ivin Poot, M.D.  ASSISTANT:  Quincy Simmonds, PA-C.  ANESTHESIA:  General.  History of Present Illness:    The patient is a 61 year old male status post the above procedure with significant metabolic syndrome comorbidities dosing the office on today's date to evaluate his right lower extremity EVH site. He states that it has been dehisced since prior to discharge from the hospital. He is only covering with a sterile dressing. There is doing no packing of the wound. There is no erythema associated with the wound and no purulent drainage. There is some serous drainage. He has not been having fevers, chills or other constitutional symptoms      Past Medical History:  Diagnosis Date  . Anxiety   . Arthritis   . Coronary atherosclerosis of native coronary artery    BMS LAD and PTCA RCA 2000,BMS RCA/PLA 2001  . Diabetes mellitus   . Diverticulitis   . Fatty liver   . Gastric ulcer   . GERD (gastroesophageal reflux disease)   . Heart murmur    as a teenager  . HTN (hypertension)   . Hyperlipidemia   . Low-grade NHL (non-Hodgkin's  lymphoma) 2007  . Lupus anticoagulant positive   . MI (myocardial infarction) ([place]) 2001  . Neuropathy    peripheral  . Type 2 diabetes mellitus ([place])   . Umbilical hernia      History  Smoking Status  . Former Smoker  . Packs/day: 1.00  . Years: 23.00  . Types: Cigarettes  . Start date: 10/30/1970  . Quit date: 10/29/1993  Smokeless Tobacco  . Former Systems developer  . Types: Snuff, Chew    History  Alcohol Use No     Allergies  Allergen Reactions  . No Known Allergies     Current Outpatient Prescriptions  Medication Sig Dispense Refill  . amiodarone (PACERONE) 200 MG tablet Take 1 tablet (200 mg total) by mouth daily. 30 tablet 1  . apixaban (ELIQUIS) 5 MG TABS tablet Take 1 tablet (5 mg total) by mouth 2 (two) times daily. 60 tablet 3  . aspirin EC 81 MG tablet Take 81 mg by mouth daily.    . cephALEXin (KEFLEX) 500 MG capsule Take 1 capsule (500 mg total) by mouth every 8 (eight) hours. For 7 Days 21 capsule 0  . cholecalciferol (VITAMIN D) 1000 units tablet Take 1,000 Units by mouth daily.    . ferrous sulfate 325 (65 FE) MG tablet Take 1 tablet (325  mg total) by mouth daily with breakfast. 30 tablet 0  . FLUoxetine (PROZAC) 20 MG capsule Take 20 mg by mouth daily.    . folic acid (FOLVITE) 1 MG tablet Take 1 tablet (1 mg total) by mouth daily. 30 tablet 0  . furosemide (LASIX) 40 MG tablet Take 1 tablet (40 mg total) by mouth daily. 30 tablet 11  . gabapentin (NEURONTIN) 300 MG capsule Take 300 mg by mouth at bedtime.    . insulin aspart (NOVOLOG FLEXPEN) 100 UNIT/ML FlexPen Inject 18-24 Units into the skin 3 (three) times daily with meals. 30 mL 2  . Insulin Glargine (BASAGLAR KWIKPEN) 100 UNIT/ML SOPN INJECT 80 UNITS INTO THE SKIN DAILY AT 10 PM 30 mL 2  . metFORMIN (GLUCOPHAGE) 500 MG tablet Take 1 tablet (500 mg total) by mouth 2 (two) times daily with a meal. 60 tablet 3  . metoprolol (LOPRESSOR) 50 MG tablet Take 25 mg by mouth 2 (two) times daily.      . nitroGLYCERIN  (NITROSTAT) 0.4 MG SL tablet Place 1 tablet (0.4 mg total) under the tongue every 5 (five) minutes as needed. 25 tablet 3  . ONETOUCH VERIO test strip USE FOUR TIMES DAILY AS DIRECTED 450 each 0  . oxyCODONE (ROXICODONE) 15 MG immediate release tablet Take 1 tablet (15 mg total) by mouth every 4 (four) hours as needed for pain. 30 tablet 0  . pantoprazole (PROTONIX) 40 MG tablet TAKE 1 TABLET BY MOUTH BEFORE BREAKFAST 30 tablet 11  . potassium chloride 20 MEQ TBCR Take 20 mEq by mouth daily. 30 tablet 1  . simvastatin (ZOCOR) 10 MG tablet TAKE 1 TABLET BY MOUTH EVERY NIGHT AT BEDTIME 30 tablet 2  . tamsulosin (FLOMAX) 0.4 MG CAPS capsule Take 1 capsule by mouth every evening.  12  . VICTOZA 18 MG/3ML SOPN ADMINISTER 1.8 MG UNDER THE SKIN EVERY DAY 9 mL 2  . zolpidem (AMBIEN) 10 MG tablet Take 10 mg by mouth at bedtime as needed for sleep.     No current facility-administered medications for this visit.        Physical Exam: BP 116/68   Pulse 92   Resp 20   Ht 6\' 3"  (1.905 m)   Wt (!) 343 lb (155.6 kg)   SpO2 93%   BMI 42.87 kg/m   General appearance: alert, cooperative and no distress Heart: regular rate and rhythm Lungs: clear to auscultation bilaterally Abdomen: soft, non-tender; bowel sounds normal; no masses,  no organomegaly Extremities: Positive for bilateral lower extremity edema which is at least in part chronic. Wound: The right lower extremity EVH site has a superficial skin dehiscence. I explored it with a Q-tip and it is tracking somewhat. There is no purulence or erythema. Additionally the chest tube sites have superficial dehiscence as well but they are not deep enough to pack. I cleaned gently with hydrogen peroxide.   Diagnostic Studies & Laboratory data:     Recent Radiology Findings:   No results found.    Recent Lab Findings: Lab Results  Component Value Date   WBC 9.7 11/19/2016   HGB 9.4 (L) 11/19/2016   HCT 29.4 (L) 11/19/2016   PLT 170 11/19/2016     GLUCOSE 192 (H) 11/20/2016   CHOL 137 01/28/2016   TRIG 281 (H) 01/28/2016   HDL 40 01/28/2016   LDLCALC 41 01/28/2016   ALT 22 11/18/2016   AST 28 11/18/2016   NA 134 (L) 11/20/2016   K 3.5 11/20/2016  CL 90 (L) 11/20/2016   CREATININE 1.30 (H) 11/20/2016   BUN 31 (H) 11/20/2016   CO2 34 (H) 11/20/2016   TSH 1.35 01/28/2016   INR 1.26 11/11/2016   HGBA1C 8.4 (H) 11/07/2016      Assessment / Plan:  Superficial dehiscence of the right lower extremity EVH incision. I packed it with 2 sterile 2 x 2's in normal saline. I also clean the chest tube sites with Betadine. We will arrange a home nurse to do dressing changes and I discussed with the wife who is an LPN that this should be done twice daily and she is comfortable dealing with this as well. We will see again in 1 week for a nurse visit to reevaluate the wound.    Agree with above plan P Prescott Gum MD      John Giovanni, PA-C 11/25/2016 2:17 PM

## 2016-11-25 NOTE — Patient Instructions (Signed)
Prior to wounds twice daily with normal saline 2 x 2's. [facility]) site. Clean the chest tube sites gently twice daily with hygiene peroxide.

## 2016-11-26 ENCOUNTER — Telehealth: Payer: Self-pay

## 2016-11-26 ENCOUNTER — Ambulatory Visit: Payer: Self-pay | Admitting: Cardiology

## 2016-11-26 DIAGNOSIS — T8131XA Disruption of external operation (surgical) wound, not elsewhere classified, initial encounter: Secondary | ICD-10-CM | POA: Insufficient documentation

## 2016-11-26 NOTE — Telephone Encounter (Signed)
New order was Faxed to [facility] for skilled nursing for wound care and teaching, to dehiscence of the right lower extremity EVH incision site. Wet to dry- pack evh site with damp normal saline  2x2 sterile gauze and cover with 4x4 and tape. Dressing should be changed twice a day.

## 2016-12-02 ENCOUNTER — Ambulatory Visit (INDEPENDENT_AMBULATORY_CARE_PROVIDER_SITE_OTHER): Payer: Self-pay

## 2016-12-02 DIAGNOSIS — Z4889 Encounter for other specified surgical aftercare: Secondary | ICD-10-CM

## 2016-12-05 ENCOUNTER — Encounter: Payer: Self-pay | Admitting: "Endocrinology

## 2016-12-05 ENCOUNTER — Ambulatory Visit (INDEPENDENT_AMBULATORY_CARE_PROVIDER_SITE_OTHER): Payer: BC Managed Care – PPO | Admitting: "Endocrinology

## 2016-12-05 VITALS — BP 125/76 | HR 83 | Ht 75.0 in | Wt 340.0 lb

## 2016-12-05 DIAGNOSIS — E1159 Type 2 diabetes mellitus with other circulatory complications: Secondary | ICD-10-CM

## 2016-12-05 DIAGNOSIS — E782 Mixed hyperlipidemia: Secondary | ICD-10-CM

## 2016-12-05 DIAGNOSIS — I10 Essential (primary) hypertension: Secondary | ICD-10-CM

## 2016-12-05 NOTE — Patient Instructions (Signed)

## 2016-12-05 NOTE — Progress Notes (Signed)
Subjective:    Patient [name], male    DOB: 05-06-1956, PCP Sinda Du, MD   Past Medical History:  Diagnosis Date  . Anxiety   . Arthritis   . Coronary atherosclerosis of native coronary artery    BMS LAD and PTCA RCA 2000,BMS RCA/PLA 2001  . Diabetes mellitus   . Diverticulitis   . Fatty liver   . Gastric ulcer   . GERD (gastroesophageal reflux disease)   . Heart murmur    as a teenager  . HTN (hypertension)   . Hyperlipidemia   . Low-grade NHL (non-Hodgkin's lymphoma) 2007  . Lupus anticoagulant positive   . MI (myocardial infarction) ([place]) 2001  . Neuropathy    peripheral  . Type 2 diabetes mellitus ([place])   . Umbilical hernia    Past Surgical History:  Procedure Laterality Date  . Axillary abscess     left incision and drainage left  . BACK SURGERY     multiple  . BIOPSY  08/15/2014   Procedure: BIOPSY;  Surgeon: Danie Binder, MD;  Location: [facility];  Service: Endoscopy;;  Gastric  . BIOPSY  01/19/2015   Procedure: BIOPSY (GASTRIC ULCER);  Surgeon: Danie Binder, MD;  Location: [facility];  Service: Endoscopy;;  . CARDIAC CATHETERIZATION    . cardiac stents     x5 stents  . COLONOSCOPY  June 2008   Dr. Dellis Filbert Medoff: Mild left colonic diverticulosis  . CORONARY ARTERY BYPASS GRAFT N/A 11/11/2016   Procedure: CORONARY ARTERY BYPASS GRAFTING (CABG) x 3, USING LEFT MAMMARY ARTERY AND RIGHT GREATER SAPHENOUS VEIN HARVESTED ENDOSCOPICALLY;  Surgeon: Ivin Poot, MD;  Location: [facility];  Service: Open Heart Surgery;  Laterality: N/A;  . Dental extractions    . ESOPHAGOGASTRODUODENOSCOPY  June 2008   Dr. Dellis Filbert Medoff: Gastric ulcer, antral, biopsy with reactive changes associated glandular atrophy, no H pylori. No features of lymphoma. Likely NSAID related  . ESOPHAGOGASTRODUODENOSCOPY  August 2008   Dr. Dellis Filbert Medoff: Complete healing of gastric ulcers.  . ESOPHAGOGASTRODUODENOSCOPY (EGD) WITH PROPOFOL N/A 08/15/2014   SLF: 1. Abnormal pain due  to ulcers & gastriitis   . ESOPHAGOGASTRODUODENOSCOPY (EGD) WITH PROPOFOL N/A 01/19/2015   SLF: 1. Persistant ulcer with firm base 2. single gastric polyp found in the gastric antrum. Ulcer base   . EUS N/A 03/21/2015   Thickened gastric wall in pre-pyloric antrum, no obvious intramural mass, fatty pancreas, no obvious pancreatic mass.   . Inguinal lymph node biopsy     right inguinal lymph node biopsy  . INTRAVASCULAR PRESSURE WIRE/FFR STUDY N/A 09/26/2016   Procedure: Intravascular Pressure Wire/FFR Study;  Surgeon: Nelva Bush, MD;  Location: [facility];  Service: Cardiovascular;  Laterality: N/A;  . LEFT HEART CATH AND CORONARY ANGIOGRAPHY N/A 09/26/2016   Procedure: Left Heart Cath and Coronary Angiography;  Surgeon: Nelva Bush, MD;  Location: [facility];  Service: Cardiovascular;  Laterality: N/A;  . NECK SURGERY    . POLYPECTOMY  01/19/2015   Procedure: POLYPECTOMY (GASTRIC);  Surgeon: Danie Binder, MD;  Location: [facility];  Service: Endoscopy;;  . SHOULDER ARTHROSCOPY Left   . TEE WITHOUT CARDIOVERSION N/A 11/11/2016   Procedure: TRANSESOPHAGEAL ECHOCARDIOGRAM (TEE);  Surgeon: Prescott Gum, Collier Salina, MD;  Location: [place] [facility];  Service: Open Heart Surgery;  Laterality: N/A;   Social History   Social History  . Marital status: Married    Spouse name: N/A  . Number of children: N/A  .  Years of education: N/A   Social History Main Topics  . Smoking status: Former Smoker    Packs/day: 1.00    Years: 23.00    Types: Cigarettes    Start date: 10/30/1970    Quit date: 10/29/1993  . Smokeless tobacco: Former Systems developer    Types: Snuff, Chew  . Alcohol use No  . Drug use: No  . Sexual activity: No   Other Topics Concern  . None   Social History Narrative  . None   Outpatient Encounter Prescriptions as of 12/05/2016  Medication Sig  . amiodarone (PACERONE) 200 MG tablet Take 1 tablet (200 mg total) by mouth daily.  Marland Kitchen apixaban (ELIQUIS) 5 MG TABS tablet Take 1 tablet (5 mg  total) by mouth 2 (two) times daily.  Marland Kitchen aspirin EC 81 MG tablet Take 81 mg by mouth daily.  . cholecalciferol (VITAMIN D) 1000 units tablet Take 1,000 Units by mouth daily.  . ferrous sulfate 325 (65 FE) MG tablet Take 1 tablet (325 mg total) by mouth daily with breakfast.  . FLUoxetine (PROZAC) 20 MG capsule Take 20 mg by mouth daily.  . folic acid (FOLVITE) 1 MG tablet Take 1 tablet (1 mg total) by mouth daily.  . furosemide (LASIX) 40 MG tablet Take 1 tablet (40 mg total) by mouth daily.  Marland Kitchen gabapentin (NEURONTIN) 300 MG capsule Take 300 mg by mouth at bedtime.  . insulin aspart (NOVOLOG FLEXPEN) 100 UNIT/ML FlexPen Inject 18-24 Units into the skin 3 (three) times daily with meals.  . Insulin Glargine (BASAGLAR KWIKPEN) 100 UNIT/ML SOPN INJECT 80 UNITS INTO THE SKIN DAILY AT 10 PM  . metFORMIN (GLUCOPHAGE) 500 MG tablet Take 1 tablet (500 mg total) by mouth 2 (two) times daily with a meal.  . metoprolol (LOPRESSOR) 50 MG tablet Take 25 mg by mouth 2 (two) times daily.    . nitroGLYCERIN (NITROSTAT) 0.4 MG SL tablet Place 1 tablet (0.4 mg total) under the tongue every 5 (five) minutes as needed.  Glory Rosebush VERIO test strip USE FOUR TIMES DAILY AS DIRECTED  . oxyCODONE (ROXICODONE) 15 MG immediate release tablet Take 1 tablet (15 mg total) by mouth every 4 (four) hours as needed for pain.  . pantoprazole (PROTONIX) 40 MG tablet TAKE 1 TABLET BY MOUTH BEFORE BREAKFAST  . potassium chloride 20 MEQ TBCR Take 20 mEq by mouth daily.  . simvastatin (ZOCOR) 10 MG tablet TAKE 1 TABLET BY MOUTH EVERY NIGHT AT BEDTIME  . tamsulosin (FLOMAX) 0.4 MG CAPS capsule Take 1 capsule by mouth every evening.  Marland Kitchen VICTOZA 18 MG/3ML SOPN ADMINISTER 1.8 MG UNDER THE SKIN EVERY DAY  . zolpidem (AMBIEN) 10 MG tablet Take 10 mg by mouth at bedtime as needed for sleep.  . [DISCONTINUED] cephALEXin (KEFLEX) 500 MG capsule Take 1 capsule (500 mg total) by mouth every 8 (eight) hours. For 7 Days   No facility-administered  encounter medications on file as of 12/05/2016.    ALLERGIES: Allergies  Allergen Reactions  . No Known Allergies    VACCINATION STATUS:  There is no immunization history on file for this patient.  Diabetes  He presents for his follow-up diabetic visit. He has type 2 diabetes mellitus. Onset time: He was diagnosed at approximate age of 64 years. His disease course has been improving. There are no hypoglycemic associated symptoms. Pertinent negatives for hypoglycemia include no confusion, headaches, pallor or seizures. There are no diabetic associated symptoms. Pertinent negatives for diabetes include no chest pain, no  fatigue, no polydipsia, no polyphagia, no polyuria and no weakness. Symptoms are improving. Diabetic complications include heart disease and nephropathy. Risk factors for coronary artery disease include diabetes mellitus, dyslipidemia, hypertension, male sex, obesity, sedentary lifestyle and tobacco exposure. Current diabetic treatment includes intensive insulin program and oral agent (dual therapy). He is compliant with treatment most of the time. His weight is increasing steadily. He is following a generally unhealthy diet. When asked about meal planning, he reported none. Prior visit with dietitian: Declines dietitian referral. His breakfast blood glucose range is generally 140-180 mg/dl. His lunch blood glucose range is generally 140-180 mg/dl. His dinner blood glucose range is generally 140-180 mg/dl. His overall blood glucose range is 140-180 mg/dl. An ACE inhibitor/angiotensin II receptor blocker is being taken.  Hyperlipidemia  This is a chronic problem. The current episode started more than 1 year ago. Exacerbating diseases include diabetes and obesity. Pertinent negatives include no chest pain, myalgias or shortness of breath. Current antihyperlipidemic treatment includes statins. Risk factors for coronary artery disease include diabetes mellitus, dyslipidemia, hypertension, male  sex and a sedentary lifestyle.  Hypertension  This is a chronic problem. The current episode started more than 1 year ago. The problem is controlled. Pertinent negatives include no chest pain, headaches, neck pain, palpitations or shortness of breath. Past treatments include angiotensin blockers. The current treatment provides moderate improvement. Hypertensive end-organ damage includes CAD/MI.    Review of Systems  Constitutional: Negative for fatigue and unexpected weight change.  HENT: Negative for dental problem, mouth sores and trouble swallowing.   Eyes: Negative for visual disturbance.  Respiratory: Negative for cough, choking, chest tightness, shortness of breath and wheezing.   Cardiovascular: Negative for chest pain, palpitations and leg swelling.  Gastrointestinal: Negative for abdominal distention, abdominal pain, constipation, diarrhea, nausea and vomiting.  Endocrine: Negative for polydipsia, polyphagia and polyuria.  Genitourinary: Negative for dysuria, flank pain, hematuria and urgency.  Musculoskeletal: Negative for back pain, gait problem, myalgias and neck pain.  Skin: Negative for pallor, rash and wound.  Neurological: Negative for seizures, syncope, weakness, numbness and headaches.  Psychiatric/Behavioral: Negative.  Negative for confusion and dysphoric mood.    Objective:    BP 125/76   Pulse 83   Ht 6\' 3"  (1.905 m)   Wt (!) 340 lb (154.2 kg)   BMI 42.50 kg/m   Wt Readings from Last 3 Encounters:  12/05/16 (!) 340 lb (154.2 kg)  11/25/16 (!) 343 lb (155.6 kg)  11/21/16 (!) 351 lb 6.6 oz (159.4 kg)    Physical Exam  Constitutional: He is oriented to person, place, and time. He appears well-developed and well-nourished. He is cooperative. No distress.  HENT:  Head: Normocephalic and atraumatic.  Eyes: EOM are normal.  Neck: Normal range of motion. Neck supple. No tracheal deviation present. No thyromegaly present.  Cardiovascular: Normal rate, S1 normal, S2  normal and normal heart sounds.  Exam reveals no gallop.   No murmur heard. Pulses:      Dorsalis pedis pulses are 1+ on the right side, and 1+ on the left side.       Posterior tibial pulses are 1+ on the right side, and 1+ on the left side.  Pulmonary/Chest: Breath sounds normal. No respiratory distress. He has no wheezes.  Abdominal: Soft. Bowel sounds are normal. He exhibits no distension. There is no tenderness. There is no guarding and no CVA tenderness.  Musculoskeletal: He exhibits no edema.       Right shoulder: He exhibits no  swelling and no deformity.  Neurological: He is alert and oriented to person, place, and time. He has normal strength and normal reflexes. No cranial nerve deficit or sensory deficit. Gait normal.  Skin: Skin is warm and dry. No rash noted. No cyanosis. Nails show no clubbing.  Psychiatric: He has a normal mood and affect. His speech is normal and behavior is normal. Judgment and thought content normal. Cognition and memory are normal.     Diabetic Labs (most recent): Lab Results  Component Value Date   HGBA1C 8.4 (H) 11/07/2016   HGBA1C 7.1 (H) 08/11/2016   HGBA1C 7.8 (H) 05/08/2016   Lipid Panel     Component Value Date/Time   CHOL 137 01/28/2016 1602   TRIG 281 (H) 01/28/2016 1602   HDL 40 01/28/2016 1602   CHOLHDL 3.4 01/28/2016 1602   VLDL 56 (H) 01/28/2016 1602   LDLCALC 41 01/28/2016 1602   CMP     Component Value Date/Time   NA 134 (L) 11/20/2016 0450   K 3.5 11/20/2016 0450   CL 90 (L) 11/20/2016 0450   CO2 34 (H) 11/20/2016 0450   GLUCOSE 192 (H) 11/20/2016 0450   BUN 31 (H) 11/20/2016 0450   CREATININE 1.30 (H) 11/20/2016 0450   CREATININE 1.22 10/06/2016 1235   CALCIUM 8.8 (L) 11/20/2016 0450   PROT 6.3 (L) 11/18/2016 0430   ALBUMIN 2.4 (L) 11/18/2016 0430   AST 28 11/18/2016 0430   ALT 22 11/18/2016 0430   ALKPHOS 71 11/18/2016 0430   BILITOT 0.9 11/18/2016 0430   GFRNONAA 59 (L) 11/20/2016 0450   GFRNONAA 65 10/06/2016  1235   GFRAA >60 11/20/2016 0450   GFRAA 75 10/06/2016 1235      Assessment & Plan:   1. 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 These are all discussed in detail with the patient.  - During his last visit, I gave him information brochure for bariatric surgery. During evaluation in screening for the surgery he was found to have significant coronary artery disease which led to quadruple bypass coronary artery surgery. - He is currently recovering from the surgery. His average blood glucose for the last 90 days was 211 however it is improving progressively to 125 over the last 7 days. - His repeat labs show A1c of 8.4% increasing from 7.1%.   Glucose logs and insulin administration records pertaining to this visit,  to be scanned into patient's records.  Recent labs reviewed.   - I have re-counseled the patient on diet management and weight loss  by adopting a carbohydrate restricted / protein rich  Diet.  - Suggestion is made for patient to avoid simple carbohydrates   from his  diet including Cakes , Desserts, Ice Cream,  Soda (  diet and regular) , Sweet Tea , Candies,  Chips, Cookies, Artificial Sweeteners,   and "Sugar-free" Products .  This will help patient to have stable blood glucose profile and potentially avoid unintended  Weight gain.  - Patient is advised to stick to a routine mealtimes to eat 3 meals  a day and avoid unnecessary snacks ( to snack only to correct hypoglycemia).  - The patient  Declines it with CDE.  - I have approached patient with the following individualized plan to manage diabetes and patient  agrees.  -Continue  basal insulin Basaglar  80 units qhs, and lower  Novolog   to 15 units TIDAC for pre-meal BG readings of 90-150mg /dl, plus  patient specific correction dose of rapid acting insulin for unexpected hyperglycemia above 150mg /dl, associated with strict monitoring of BG AC and HS.  -Adjustment parameters for hypo and hyperglycemia were given in a written document to patient. -Patient is encouraged to call clinic for blood glucose levels less than 70 or above 300 mg /dl.  I will lower metformin to 500 mg po BID due to CKD, and continue  Victoza  1.8 mg sq daily to advance as tolerated. -he did not succeed getting on DEXCOM.  - Patient specific target  for A1c; LDL, HDL, Triglycerides, and  Waist Circumference were discussed in detail.  2) BP/HTN: Controlled . Continue current medications including ACEI/ARB. 3) Lipids/HPL:  continue statins. 4)  Weight/Diet:  He lost 11 pounds during the process of coronary artery bypass graft surgery, CDE consult in progress, exercise, and carbohydrates information provided. He is recovering from coronary artery bypass surgery. He plans to pursue the bariatric surgery after proper rehabilitation. Given his relatively young age, he would still benefit from bariatric surgery and I encouraged him to continue follow-up with the bariatric group.  5) Chronic Care/Health Maintenance:  -Patient is on ACEI/ARB and Statin medications and encouraged to continue to follow up with Ophthalmology, cardiology, Podiatrist at least yearly or according to recommendations, and advised to  stay away from smoking. I have recommended yearly flu vaccine and pneumonia vaccination at least every 5 years; moderate intensity exercise for up to 150 minutes weekly; and  sleep for at least 7 hours a day.  - 25 minutes of time was spent on the care of this patient , 50% of which was applied for counseling on diabetes complications and their preventions.  - I advised patient to maintain close follow up with Sinda Du, MD for primary care needs.  Patient is asked to bring meter and  blood glucose logs during his next  visit.    Follow up plan: -Return in about 3 months (around 03/07/2017) for follow up with pre-visit labs, meter, and logs.  Glade Lloyd, MD Phone: [phone]  Fax: [phone]   12/05/2016, 9:11 AM

## 2016-12-09 ENCOUNTER — Ambulatory Visit: Payer: Self-pay | Admitting: Adult Health

## 2016-12-11 ENCOUNTER — Other Ambulatory Visit: Payer: Self-pay | Admitting: Physician Assistant

## 2016-12-15 ENCOUNTER — Encounter: Payer: Self-pay | Admitting: *Deleted

## 2016-12-15 ENCOUNTER — Ambulatory Visit (INDEPENDENT_AMBULATORY_CARE_PROVIDER_SITE_OTHER): Payer: BC Managed Care – PPO | Admitting: Adult Health

## 2016-12-15 ENCOUNTER — Encounter: Payer: Self-pay | Admitting: Adult Health

## 2016-12-15 VITALS — BP 118/72 | HR 82 | Ht 75.0 in | Wt 329.0 lb

## 2016-12-15 DIAGNOSIS — E118 Type 2 diabetes mellitus with unspecified complications: Secondary | ICD-10-CM

## 2016-12-15 DIAGNOSIS — I2510 Atherosclerotic heart disease of native coronary artery without angina pectoris: Secondary | ICD-10-CM | POA: Diagnosis not present

## 2016-12-15 DIAGNOSIS — I10 Essential (primary) hypertension: Secondary | ICD-10-CM

## 2016-12-15 DIAGNOSIS — I4891 Unspecified atrial fibrillation: Secondary | ICD-10-CM | POA: Diagnosis not present

## 2016-12-15 MED ORDER — FERROUS SULFATE 325 (65 FE) MG PO TABS: 325.0000 mg | ORAL_TABLET | Freq: Every day | ORAL | 3 refills | Status: DC

## 2016-12-15 MED ORDER — FOLIC ACID 1 MG PO TABS
1.0000 mg | ORAL_TABLET | Freq: Every day | ORAL | 3 refills | Status: DC
Start: 2016-12-15 — End: 2017-01-21

## 2016-12-15 NOTE — Progress Notes (Signed)
Cardiology Office Note   Date:  12/15/2016   ID:  CHANOCH MCCLEERY, DOB 03-Jul-1955, MRN [id number]  PCP:  Sinda Du, MD  Cardiologist: Domenic Polite Chief Complaint  Patient presents with  . Coronary Artery Disease    CABG  . Hypertension      History of Present Illness: Kelly [name] is a 61 y.o. male who presents for posthospitalization follow-up after admission for severe three-vessel coronary artery disease with normal LV function, subsequent coronary artery bypass grafting (LIMA to LAD, SVG to OM 2, SVG to posterior descending).  Of note the patient also had ultrasound of the liver which showed no evidence of cirrhosis despite history of same. The patient had atrial fibrillation postoperatively  was started on amiodarone. He remained in atrial fibrillation with rate control and started on ELIQUIS at discharge. The patient was also started on metoprolol.   Due to history of obstructive sleep apnea he is placed on CPAP at night. He had some evidence of CHF postoperatively and was diuresed. There was some evidence of evisceration of right thigh wound dehiscence and he was started on Keflex. He was discharged on 11/20/2016.  He followed up with cardiothoracic surgery on 11/25/2016. Wound check was made he continues to have some serous drainage but no active infection fevers or constitutional symptoms. Wound was packed with 2 sterile 2 x 2's and normal saline. Home health nurses were to follow him for evaluation of his wound and ongoing management. He is also followed up with Dr. Dorris Fetch for ongoing management of diabetes and cholesterol control.  He is without cardiac complaints today he denies recurrent chest pain dyspnea on exertion. He has some soreness at the sternotomy site. His right leg saphenous vein harvest site is dressed and is not draining substantially according to his wife who is changing his dressing twice a day. There is no odor. He is medically compliant.  Past Medical  History:  Diagnosis Date  . Anxiety   . Arthritis   . Coronary atherosclerosis of native coronary artery    BMS LAD and PTCA RCA 2000,BMS RCA/PLA 2001  . Diabetes mellitus   . Diverticulitis   . Fatty liver   . Gastric ulcer   . GERD (gastroesophageal reflux disease)   . Heart murmur    as a teenager  . HTN (hypertension)   . Hyperlipidemia   . Low-grade NHL (non-Hodgkin's lymphoma) 2007  . Lupus anticoagulant positive   . MI (myocardial infarction) ([place]) 2001  . Neuropathy    peripheral  . Type 2 diabetes mellitus ([place])   . Umbilical hernia     Past Surgical History:  Procedure Laterality Date  . Axillary abscess     left incision and drainage left  . BACK SURGERY     multiple  . BIOPSY  08/15/2014   Procedure: BIOPSY;  Surgeon: Danie Binder, MD;  Location: [facility];  Service: Endoscopy;;  Gastric  . BIOPSY  01/19/2015   Procedure: BIOPSY (GASTRIC ULCER);  Surgeon: Danie Binder, MD;  Location: [facility];  Service: Endoscopy;;  . CARDIAC CATHETERIZATION    . cardiac stents     x5 stents  . COLONOSCOPY  June 2008   Dr. Dellis Filbert Medoff: Mild left colonic diverticulosis  . CORONARY ARTERY BYPASS GRAFT N/A 11/11/2016   Procedure: CORONARY ARTERY BYPASS GRAFTING (CABG) x 3, USING LEFT MAMMARY ARTERY AND RIGHT GREATER SAPHENOUS VEIN HARVESTED ENDOSCOPICALLY;  Surgeon: Ivin Poot, MD;  Location: [facility] [place];  Service: Open Heart Surgery;  Laterality: N/A;  . Dental extractions    . ESOPHAGOGASTRODUODENOSCOPY  June 2008   Dr. Dellis Filbert Medoff: Gastric ulcer, antral, biopsy with reactive changes associated glandular atrophy, no H pylori. No features of lymphoma. Likely NSAID related  . ESOPHAGOGASTRODUODENOSCOPY  August 2008   Dr. Dellis Filbert Medoff: Complete healing of gastric ulcers.  . ESOPHAGOGASTRODUODENOSCOPY (EGD) WITH PROPOFOL N/A 08/15/2014   SLF: 1. Abnormal pain due to ulcers & gastriitis   . ESOPHAGOGASTRODUODENOSCOPY (EGD) WITH PROPOFOL N/A 01/19/2015   SLF: 1. Persistant ulcer  with firm base 2. single gastric polyp found in the gastric antrum. Ulcer base   . EUS N/A 03/21/2015   Thickened gastric wall in pre-pyloric antrum, no obvious intramural mass, fatty pancreas, no obvious pancreatic mass.   . Inguinal lymph node biopsy     right inguinal lymph node biopsy  . INTRAVASCULAR PRESSURE WIRE/FFR STUDY N/A 09/26/2016   Procedure: Intravascular Pressure Wire/FFR Study;  Surgeon: Nelva Bush, MD;  Location: [place] [facility];  Service: Cardiovascular;  Laterality: N/A;  . LEFT HEART CATH AND CORONARY ANGIOGRAPHY N/A 09/26/2016   Procedure: Left Heart Cath and Coronary Angiography;  Surgeon: Nelva Bush, MD;  Location: [place] [facility];  Service: Cardiovascular;  Laterality: N/A;  . NECK SURGERY    . POLYPECTOMY  01/19/2015   Procedure: POLYPECTOMY (GASTRIC);  Surgeon: Danie Binder, MD;  [facility];  Service: Endoscopy;;  . SHOULDER ARTHROSCOPY Left   . TEE WITHOUT CARDIOVERSION N/A 11/11/2016   Procedure: TRANSESOPHAGEAL ECHOCARDIOGRAM (TEE);  Surgeon: Prescott Gum, Collier Salina, MD;  Location: [facility];  Service: Open Heart Surgery;  Laterality: N/A;     Current Outpatient Prescriptions  Medication Sig Dispense Refill  . amiodarone (PACERONE) 200 MG tablet Take 1 tablet (200 mg total) by mouth daily. 30 tablet 1  . apixaban (ELIQUIS) 5 MG TABS tablet Take 1 tablet (5 mg total) by mouth 2 (two) times daily. 60 tablet 3  . aspirin EC 81 MG tablet Take 81 mg by mouth daily.    . cholecalciferol (VITAMIN D) 1000 units tablet Take 1,000 Units by mouth daily.    . ferrous sulfate 325 (65 FE) MG tablet TAKE 1 TABLET BY MOUTH DAILY WITH BREAKFAST 30 tablet 0  . FLUoxetine (PROZAC) 20 MG capsule Take 20 mg by mouth daily.    . folic acid (FOLVITE) 1 MG tablet TAKE 1 TABLET BY MOUTH DAILY 30 tablet 0  . furosemide (LASIX) 40 MG tablet Take 1 tablet (40 mg total) by mouth daily. 30 tablet 11  . gabapentin (NEURONTIN) 300 MG capsule Take 300 mg by mouth at bedtime.    .  insulin aspart (NOVOLOG FLEXPEN) 100 UNIT/ML FlexPen Inject 18-24 Units into the skin 3 (three) times daily with meals. 30 mL 2  . Insulin Glargine (BASAGLAR KWIKPEN) 100 UNIT/ML SOPN INJECT 80 UNITS INTO THE SKIN DAILY AT 10 PM 30 mL 2  . metFORMIN (GLUCOPHAGE) 500 MG tablet Take 1 tablet (500 mg total) by mouth 2 (two) times daily with a meal. 60 tablet 3  . metoprolol (LOPRESSOR) 50 MG tablet Take 25 mg by mouth 2 (two) times daily.      . nitroGLYCERIN (NITROSTAT) 0.4 MG SL tablet Place 1 tablet (0.4 mg total) under the tongue every 5 (five) minutes as needed. 25 tablet 3  . ONETOUCH VERIO test strip USE FOUR TIMES DAILY AS DIRECTED 450 each 0  . oxyCODONE (ROXICODONE) 15 MG immediate release tablet Take 1 tablet (15 mg total) by mouth  every 4 (four) hours as needed for pain. 30 tablet 0  . pantoprazole (PROTONIX) 40 MG tablet TAKE 1 TABLET BY MOUTH BEFORE BREAKFAST 30 tablet 11  . potassium chloride 20 MEQ TBCR Take 20 mEq by mouth daily. 30 tablet 1  . simvastatin (ZOCOR) 10 MG tablet TAKE 1 TABLET BY MOUTH EVERY NIGHT AT BEDTIME 30 tablet 2  . tamsulosin (FLOMAX) 0.4 MG CAPS capsule Take 1 capsule by mouth every evening.  12  . VICTOZA 18 MG/3ML SOPN ADMINISTER 1.8 MG UNDER THE SKIN EVERY DAY 9 mL 2  . zolpidem (AMBIEN) 10 MG tablet Take 10 mg by mouth at bedtime as needed for sleep.     No current facility-administered medications for this visit.     Allergies:   No known allergies    Social History:  The patient  reports that he quit smoking about 23 years ago. His smoking use included Cigarettes. He started smoking about 46 years ago. He has a 23.00 pack-year smoking history. He has quit using smokeless tobacco. His smokeless tobacco use included Snuff and Chew. He reports that he does not drink alcohol or use drugs.   Family History:  The patient's family history includes Diabetes in his father; Heart disease in his father; Hypertension in his mother; Obesity in his mother;  Pancreatic cancer in his maternal grandmother; Thyroid disease in his father.    ROS: All other systems are reviewed and negative. Unless otherwise mentioned in H&P    PHYSICAL EXAM: VS:  BP 118/72   Pulse 82   Ht 6\' 3"  (1.905 m)   Wt (!) 329 lb (149.2 kg)   SpO2 95%   BMI 41.12 kg/m  , BMI Body mass index is 41.12 kg/m. GEN: Well nourished, well developed, in no acute distress Morbidly obese.  HEENT: normal  Neck: no JVD, carotid bruits, or masses Cardiac: RRR; no murmurs, rubs, or gallops,no edema  Respiratory:  Clear to auscultation bilaterally, normal work of breathing GI: soft, nontender, nondistended, + BS MS: no deformity or atrophy sternotomy scar is well-healed without evidence of evisceration of infection. Chest 2 sites are still slightly sore, with some crusting and some mild serous drainage. No evidence of infection. Right leg dressing is intact without any drainage seen or stain on 2 x 2. No odor. Skin: warm and dry, no rash Neuro:  Strength and sensation are intact Psych: euthymic mood, full affect   EKG: NSR, 1st degree AV block,  with RBBB, rate of 81 bpm.   Recent Labs: 01/28/2016: TSH 1.35 11/12/2016: Magnesium 1.6 11/18/2016: ALT 22 11/19/2016: Hemoglobin 9.4; Platelets 170 11/20/2016: BUN 31; Creatinine, Ser 1.30; Potassium 3.5; Sodium 134    Lipid Panel    Component Value Date/Time   CHOL 137 01/28/2016 1602   TRIG 281 (H) 01/28/2016 1602   HDL 40 01/28/2016 1602   CHOLHDL 3.4 01/28/2016 1602   VLDL 56 (H) 01/28/2016 1602   LDLCALC 41 01/28/2016 1602      Wt Readings from Last 3 Encounters:  12/15/16 (!) 329 lb (149.2 kg)  12/05/16 (!) 340 lb (154.2 kg)  11/25/16 (!) 343 lb (155.6 kg)      Other studies Reviewed: Cardiac Cath 09/26/2016 Conclusion   Conclusions: 1. Significant three-vessel coronary artery disease including mild to moderate in-stent restenosis involving the proximal and mid LAD as well as 70% proximal LAD stenosis just beyond  the first stent. FFR across these lesions is hemodynamically significant at 0.73. 60% proximal OM2 stenosis is also  present, as well as chronic total occlusion of the proximal and mid RCA. 2. Normal left ventricular filling pressure. 3. Normal left ventricular contraction.  Recommendations: 1. Given three-vessel coronary artery disease, predominantly affecting the LAD and RCA, recommend surgical consultation for CABG. 2. Continue aggressive prevention. 3. Obtain transthoracic echocardiogram.  Nelva Bush, MD   Echocardiogram 09/30/2016 Procedure narrative: Transthoracic echocardiography. Image   quality was adequate. Intravenous contrast (Definity) was   administered. - Left ventricle: The cavity size was normal. Wall thickness was   increased in a pattern of moderate LVH. Systolic function was   normal. The estimated ejection fraction was in the range of 55%   to 60%. Wall motion was normal; there were no regional wall   motion abnormalities. Left ventricular diastolic function   parameters were normal. - Aortic valve: Trileaflet; mildly thickened, mild to moderately   calcified leaflets. There was mild to moderate stenosis. Peak   velocity (S): 232 cm/s. Mean gradient (S): 11 mm Hg. Valve area   (VTI): 1.42 cm^2. Valve area (Vmax): 1.33 cm^2. Valve area   (Vmean): 1.42 cm^2. - Right ventricle: The cavity size was mildly dilated. Wall   thickness was normal.   ASSESSMENT AND PLAN:  1. Coronary artery disease: Status post coronary artery bypass grafting by Dr. Nils Pyle. Patient is recovering well. No complaints of significant chest pain or dyspnea on exertion. The patient does not wish to return to work until follow-up appointment with cardiothoracic surgery on 12/24/2016. A letter is provided.  I'm referring him to cardiac rehabilitation for education, and rehabilitation to increase stamina and exercise instruction.. In the interim he will remain on metoprolol titrate 50 mg  twice a day and atorvastatin. He is not on aspirin at this time is he is on ELIQUIS.  2. Paroxysmal atrial fibrillation: This occurred postoperatively. He was placed on amiodarone, and remains on 200 mg daily along with apixaban 5 mg twice a day. He will likely be discontinued on both medications when followed by CVTS. We will defer to timing of this after being seen in their office on August 8.  3. Postoperative CHF: No evidence of decompensation today. He remains on Lasix 20 mg daily and potassium supplements.  4. Right leg saphenous vein harvest site evisceration: Currently being dressed 2 times a day with saline and 2 x 2. Wife is changing dressing along with checks by home health nurse. They report no odor or signs of infection.  5. Morbid obesity: Patient was referred for bariatric surgery prior to his CABG. Preoperative cardiovascular assessment led to cath and bypass grafting. He is still interested in bariatric surgery. We'll defer to CVTS for timing of this. I have advised that he may be up to one year before he is able to undergo this procedure.  6. Diabetes: Followed by endocrinologist, Dr. Dorris Fetch.   Current medicines are reviewed at length with the patient today.    Labs/ tests ordered today include:   Phill Myron. West Pugh, ANP, AACC   12/15/2016 3:30 PM    [facility] [address] [place], [place] [address] Phone: [phone]; Fax: [phone]

## 2016-12-15 NOTE — Patient Instructions (Signed)
Medication Instructions:  Your physician recommends that you continue on your current medications as directed. Please refer to the Current Medication list given to you today.   Labwork: NONE  Testing/Procedures: NONE  Follow-Up: Your physician recommends that you schedule a follow-up appointment in: 3 Months with Dr. Domenic Polite.    Any Other Special Instructions Will Be Listed Below (If Applicable).  You have been given a letter for work.    If you need a refill on your cardiac medications before your next appointment, please call your pharmacy.  Thank you for choosing [facility]!

## 2016-12-23 ENCOUNTER — Other Ambulatory Visit: Payer: Self-pay | Admitting: Cardiothoracic Surgery

## 2016-12-23 DIAGNOSIS — Z951 Presence of aortocoronary bypass graft: Secondary | ICD-10-CM

## 2016-12-24 ENCOUNTER — Ambulatory Visit (INDEPENDENT_AMBULATORY_CARE_PROVIDER_SITE_OTHER): Payer: Self-pay | Admitting: Cardiothoracic Surgery

## 2016-12-24 ENCOUNTER — Ambulatory Visit
Admission: RE | Admit: 2016-12-24 | Discharge: 2016-12-24 | Disposition: A | Discharge status: Home / Self Care | Payer: BC Managed Care – PPO | Source: Ambulatory Visit | Attending: Cardiothoracic Surgery | Admitting: Cardiothoracic Surgery

## 2016-12-24 ENCOUNTER — Encounter: Payer: Self-pay | Admitting: Cardiothoracic Surgery

## 2016-12-24 VITALS — BP 111/70 | HR 87 | Resp 20 | Ht 75.0 in | Wt 328.0 lb

## 2016-12-24 DIAGNOSIS — Z951 Presence of aortocoronary bypass graft: Secondary | ICD-10-CM

## 2016-12-24 DIAGNOSIS — I209 Angina pectoris, unspecified: Secondary | ICD-10-CM

## 2016-12-24 DIAGNOSIS — I2510 Atherosclerotic heart disease of native coronary artery without angina pectoris: Secondary | ICD-10-CM

## 2016-12-24 DIAGNOSIS — T8130XA Disruption of wound, unspecified, initial encounter: Secondary | ICD-10-CM

## 2016-12-24 NOTE — Progress Notes (Signed)
PCP is Sinda Du, MD Referring Provider is Satira Sark, MD  Chief Complaint  Patient presents with  . Routine Post Op    f/u from surgery with CXR s/p  Coronary artery bypass grafting x3, 11/10/16     HPI: Morbidly obese diabetic returns for scheduled visit after urgent multivessel CABG. He has done well without return of angina or symptoms of CHF. The sternal incision is healing well except for some breakdown of the epidermis in focal areas. The right leg incision has a open area which is clean and granulating at the saphenous vein tunnel incision. This is treated with daily saline wet-to-dry. The chest tube sites still have mucoid eschar treated with topical measures-peroxide saline scrub.  He denies sternal click. Chest x-ray today is clear. He is maintained a sinus rhythm and can come off his eliquis and amiodarone.  Past Medical History:  Diagnosis Date  . Anxiety   . Arthritis   . Coronary atherosclerosis of native coronary artery    BMS LAD and PTCA RCA 2000,BMS RCA/PLA 2001  . Diabetes mellitus   . Diverticulitis   . Fatty liver   . Gastric ulcer   . GERD (gastroesophageal reflux disease)   . Heart murmur    as a teenager  . HTN (hypertension)   . Hyperlipidemia   . Low-grade NHL (non-Hodgkin's lymphoma) 2007  . Lupus anticoagulant positive   . MI (myocardial infarction) ([place]) 2001  . Neuropathy    peripheral  . Type 2 diabetes mellitus ([place])   . Umbilical hernia     Past Surgical History:  Procedure Laterality Date  . Axillary abscess     left incision and drainage left  . BACK SURGERY     multiple  . BIOPSY  08/15/2014   Procedure: BIOPSY;  Surgeon: Danie Binder, MD;  [facility];  Service: Endoscopy;;  Gastric  . BIOPSY  01/19/2015   Procedure: BIOPSY (GASTRIC ULCER);  Surgeon: Danie Binder, MD;  [facility];  Service: Endoscopy;;  . CARDIAC CATHETERIZATION    . cardiac stents     x5 stents  . COLONOSCOPY  June 2008   Dr. Dellis Filbert  Medoff: Mild left colonic diverticulosis  . CORONARY ARTERY BYPASS GRAFT N/A 11/11/2016   Procedure: CORONARY ARTERY BYPASS GRAFTING (CABG) x 3, USING LEFT MAMMARY ARTERY AND RIGHT GREATER SAPHENOUS VEIN HARVESTED ENDOSCOPICALLY;  Surgeon: Ivin Poot, MD;  Location: [facility];  Service: Open Heart Surgery;  Laterality: N/A;  . Dental extractions    . ESOPHAGOGASTRODUODENOSCOPY  June 2008   Dr. Dellis Filbert Medoff: Gastric ulcer, antral, biopsy with reactive changes associated glandular atrophy, no H pylori. No features of lymphoma. Likely NSAID related  . ESOPHAGOGASTRODUODENOSCOPY  August 2008   Dr. Dellis Filbert Medoff: Complete healing of gastric ulcers.  . ESOPHAGOGASTRODUODENOSCOPY (EGD) WITH PROPOFOL N/A 08/15/2014   SLF: 1. Abnormal pain due to ulcers & gastriitis   . ESOPHAGOGASTRODUODENOSCOPY (EGD) WITH PROPOFOL N/A 01/19/2015   SLF: 1. Persistant ulcer with firm base 2. single gastric polyp found in the gastric antrum. Ulcer base   . EUS N/A 03/21/2015   Thickened gastric wall in pre-pyloric antrum, no obvious intramural mass, fatty pancreas, no obvious pancreatic mass.   . Inguinal lymph node biopsy     right inguinal lymph node biopsy  . INTRAVASCULAR PRESSURE WIRE/FFR STUDY N/A 09/26/2016   Procedure: Intravascular Pressure Wire/FFR Study;  Surgeon: Nelva Bush, MD;  Location: [facility];  Service: Cardiovascular;  Laterality: N/A;  .  LEFT HEART CATH AND CORONARY ANGIOGRAPHY N/A 09/26/2016   Procedure: Left Heart Cath and Coronary Angiography;  Surgeon: Nelva Bush, MD;  Location: [place] [facility];  Service: Cardiovascular;  Laterality: N/A;  . NECK SURGERY    . POLYPECTOMY  01/19/2015   Procedure: POLYPECTOMY (GASTRIC);  Surgeon: Danie Binder, MD;  [facility];  Service: Endoscopy;;  . SHOULDER ARTHROSCOPY Left   . TEE WITHOUT CARDIOVERSION N/A 11/11/2016   Procedure: TRANSESOPHAGEAL ECHOCARDIOGRAM (TEE);  Surgeon: Prescott Gum, Collier Salina, MD;  Location: [facility];  Service: Open  Heart Surgery;  Laterality: N/A;    Family History  Problem Relation Age of Onset  . Pancreatic cancer Maternal Grandmother   . Obesity Mother   . Hypertension Mother   . Diabetes Father   . Thyroid disease Father   . Heart disease Father   . Colon cancer Neg Hx     Social History Social History  Substance Use Topics  . Smoking status: Former Smoker    Packs/day: 1.00    Years: 23.00    Types: Cigarettes    Start date: 10/30/1970    Quit date: 10/29/1993  . Smokeless tobacco: Former Systems developer    Types: Snuff, Chew  . Alcohol use No    Current Outpatient Prescriptions  Medication Sig Dispense Refill  . amiodarone (PACERONE) 200 MG tablet Take 1 tablet (200 mg total) by mouth daily. 30 tablet 1  . apixaban (ELIQUIS) 5 MG TABS tablet Take 1 tablet (5 mg total) by mouth 2 (two) times daily. 60 tablet 3  . aspirin EC 81 MG tablet Take 81 mg by mouth daily.    . cholecalciferol (VITAMIN D) 1000 units tablet Take 1,000 Units by mouth daily.    . ferrous sulfate 325 (65 FE) MG tablet Take 1 tablet (325 mg total) by mouth daily with breakfast. 90 tablet 3  . FLUoxetine (PROZAC) 20 MG capsule Take 20 mg by mouth daily.    . folic acid (FOLVITE) 1 MG tablet Take 1 tablet (1 mg total) by mouth daily. 90 tablet 3  . furosemide (LASIX) 40 MG tablet Take 1 tablet (40 mg total) by mouth daily. 30 tablet 11  . gabapentin (NEURONTIN) 300 MG capsule Take 300 mg by mouth at bedtime.    . insulin aspart (NOVOLOG FLEXPEN) 100 UNIT/ML FlexPen Inject 18-24 Units into the skin 3 (three) times daily with meals. 30 mL 2  . Insulin Glargine (BASAGLAR KWIKPEN) 100 UNIT/ML SOPN INJECT 80 UNITS INTO THE SKIN DAILY AT 10 PM 30 mL 2  . metFORMIN (GLUCOPHAGE) 500 MG tablet Take 1 tablet (500 mg total) by mouth 2 (two) times daily with a meal. 60 tablet 3  . metoprolol (LOPRESSOR) 50 MG tablet Take 25 mg by mouth 2 (two) times daily.      . nitroGLYCERIN (NITROSTAT) 0.4 MG SL tablet Place 1 tablet (0.4 mg total)  under the tongue every 5 (five) minutes as needed. 25 tablet 3  . ONETOUCH VERIO test strip USE FOUR TIMES DAILY AS DIRECTED 450 each 0  . oxyCODONE (ROXICODONE) 15 MG immediate release tablet Take 1 tablet (15 mg total) by mouth every 4 (four) hours as needed for pain. 30 tablet 0  . pantoprazole (PROTONIX) 40 MG tablet TAKE 1 TABLET BY MOUTH BEFORE BREAKFAST 30 tablet 11  . potassium chloride 20 MEQ TBCR Take 20 mEq by mouth daily. 30 tablet 1  . simvastatin (ZOCOR) 10 MG tablet TAKE 1 TABLET BY MOUTH EVERY NIGHT AT  BEDTIME 30 tablet 2  . tamsulosin (FLOMAX) 0.4 MG CAPS capsule Take 1 capsule by mouth every evening.  12  . VICTOZA 18 MG/3ML SOPN ADMINISTER 1.8 MG UNDER THE SKIN EVERY DAY 9 mL 2  . zolpidem (AMBIEN) 10 MG tablet Take 10 mg by mouth at bedtime as needed for sleep.     No current facility-administered medications for this visit.     Allergies  Allergen Reactions  . No Known Allergies     Review of Systems  Improved sleep Appetite still less than normal Improved energy Anxious to start outpatient cardiac rehabilitation at [facility] hospital  BP 111/70   Pulse 87   Resp 20   Ht 6\' 3"  (1.905 m)   Wt (!) 328 lb (148.8 kg)   SpO2 96% Comment: RA  BMI 41.00 kg/m  Physical Exam      Exam    General- alert and comfortable   Lungs- clear without rales, wheezes   Cor- regular rate and rhythm, no murmur , gallop   Abdomen- soft, non-tender   Extremities - warm, non-tender, minimal edema   Neuro- oriented, appropriate, no focal weakness   Diagnostic Tests: Chest x-ray clear  Impression: Patient has achieved approximately 50% recovery from his multivessel CABG He cannot return to work until after he completes few weeks of cardiac rehabilitation. He'll return here for a final wound check in 4 weeks. Plan: Return for final wound check in 4 weeks. Patient understands he may drive and lift up to 20 pounds but no more until his next check.  Len Childs, MD [facility] [phone]

## 2016-12-26 NOTE — Addendum Note (Signed)
Addended by: Elnita Maxwell R on: 12/26/2016 10:24 AM   Modules accepted: Orders

## 2016-12-26 NOTE — Addendum Note (Signed)
Addended by: Elnita Maxwell R on: 12/26/2016 10:22 AM   Modules accepted: Orders

## 2017-01-08 ENCOUNTER — Encounter (HOSPITAL_COMMUNITY): Admission: RE | Admit: 2017-01-08 | Payer: BC Managed Care – PPO | Source: Ambulatory Visit

## 2017-01-21 ENCOUNTER — Ambulatory Visit (INDEPENDENT_AMBULATORY_CARE_PROVIDER_SITE_OTHER): Payer: Self-pay | Admitting: Cardiothoracic Surgery

## 2017-01-21 ENCOUNTER — Encounter: Payer: Self-pay | Admitting: Cardiothoracic Surgery

## 2017-01-21 VITALS — BP 124/62 | HR 70 | Resp 16 | Ht 75.0 in | Wt 336.0 lb

## 2017-01-21 DIAGNOSIS — I2510 Atherosclerotic heart disease of native coronary artery without angina pectoris: Secondary | ICD-10-CM

## 2017-01-21 DIAGNOSIS — Z951 Presence of aortocoronary bypass graft: Secondary | ICD-10-CM

## 2017-01-21 NOTE — Progress Notes (Signed)
PCP is Sinda Du, MD Referring Provider is Satira Sark, MD  Chief Complaint  Patient presents with  . Routine Post Op    4 wk f/u s/p CABG 11/11/16    HPI: Final postop visit aft2 months postop CABG for unstable angina Patient returns for wound check. The leg wound has now completely healed Sternal incision is clean and dry Patient has no symptoms of angina or CHF He is return to work teaching at [employer]  Past Medical History:  Diagnosis Date  . Anxiety   . Arthritis   . Coronary atherosclerosis of native coronary artery    BMS LAD and PTCA RCA 2000,BMS RCA/PLA 2001  . Diabetes mellitus   . Diverticulitis   . Fatty liver   . Gastric ulcer   . GERD (gastroesophageal reflux disease)   . Heart murmur    as a teenager  . HTN (hypertension)   . Hyperlipidemia   . Low-grade NHL (non-Hodgkin's lymphoma) 2007  . Lupus anticoagulant positive   . MI (myocardial infarction) ([place]) 2001  . Neuropathy    peripheral  . Type 2 diabetes mellitus ([place])   . Umbilical hernia     Past Surgical History:  Procedure Laterality Date  . Axillary abscess     left incision and drainage left  . BACK SURGERY     multiple  . BIOPSY  08/15/2014   Procedure: BIOPSY;  Surgeon: Danie Binder, MD;  [facility]: [facility];  Service: Endoscopy;;  Gastric  . BIOPSY  01/19/2015   Procedure: BIOPSY (GASTRIC ULCER);  Surgeon: Danie Binder, MD;  Location: [facility];  Service: Endoscopy;;  . CARDIAC CATHETERIZATION    . cardiac stents     x5 stents  . COLONOSCOPY  June 2008   Dr. Dellis Filbert Medoff: Mild left colonic diverticulosis  . CORONARY ARTERY BYPASS GRAFT N/A 11/11/2016   Procedure: CORONARY ARTERY BYPASS GRAFTING (CABG) x 3, USING LEFT MAMMARY ARTERY AND RIGHT GREATER SAPHENOUS VEIN HARVESTED ENDOSCOPICALLY;  Surgeon: Ivin Poot, MD;  Location: [place];  Service: Open Heart Surgery;  Laterality: N/A;  . Dental extractions    . ESOPHAGOGASTRODUODENOSCOPY  June 2008   Dr. Dellis Filbert  Medoff: Gastric ulcer, antral, biopsy with reactive changes associated glandular atrophy, no H pylori. No features of lymphoma. Likely NSAID related  . ESOPHAGOGASTRODUODENOSCOPY  August 2008   Dr. Dellis Filbert Medoff: Complete healing of gastric ulcers.  . ESOPHAGOGASTRODUODENOSCOPY (EGD) WITH PROPOFOL N/A 08/15/2014   SLF: 1. Abnormal pain due to ulcers & gastriitis   . ESOPHAGOGASTRODUODENOSCOPY (EGD) WITH PROPOFOL N/A 01/19/2015   SLF: 1. Persistant ulcer with firm base 2. single gastric polyp found in the gastric antrum. Ulcer base   . EUS N/A 03/21/2015   Thickened gastric wall in pre-pyloric antrum, no obvious intramural mass, fatty pancreas, no obvious pancreatic mass.   . Inguinal lymph node biopsy     right inguinal lymph node biopsy  . INTRAVASCULAR PRESSURE WIRE/FFR STUDY N/A 09/26/2016   Procedure: Intravascular Pressure Wire/FFR Study;  Surgeon: Nelva Bush, MD;  Location: [place] [facility];  Service: Cardiovascular;  Laterality: N/A;  . LEFT HEART CATH AND CORONARY ANGIOGRAPHY N/A 09/26/2016   Procedure: Left Heart Cath and Coronary Angiography;  Surgeon: Nelva Bush, MD;  Location: [place] [facility];  Service: Cardiovascular;  Laterality: N/A;  . NECK SURGERY    . POLYPECTOMY  01/19/2015   Procedure: POLYPECTOMY (GASTRIC);  Surgeon: Danie Binder, MD;  [facility];  Service: Endoscopy;;  .  SHOULDER ARTHROSCOPY Left   . TEE WITHOUT CARDIOVERSION N/A 11/11/2016   Procedure: TRANSESOPHAGEAL ECHOCARDIOGRAM (TEE);  Surgeon: Prescott Gum, Collier Salina, MD;  Location: [place];  Service: Open Heart Surgery;  Laterality: N/A;    Family History  Problem Relation Age of Onset  . Pancreatic cancer Maternal Grandmother   . Obesity Mother   . Hypertension Mother   . Diabetes Father   . Thyroid disease Father   . Heart disease Father   . Colon cancer Neg Hx     Social History Social History  Substance Use Topics  . Smoking status: Former Smoker    Packs/day: 1.00    Years: 23.00     Types: Cigarettes    Start date: 10/30/1970    Quit date: 10/29/1993  . Smokeless tobacco: Former Systems developer    Types: Snuff, Chew  . Alcohol use No    Current Outpatient Prescriptions  Medication Sig Dispense Refill  . apixaban (ELIQUIS) 5 MG TABS tablet Take 1 tablet (5 mg total) by mouth 2 (two) times daily. 60 tablet 3  . aspirin EC 81 MG tablet Take 81 mg by mouth daily.    . cholecalciferol (VITAMIN D) 1000 units tablet Take 1,000 Units by mouth daily.    Marland Kitchen FLUoxetine (PROZAC) 20 MG capsule Take 20 mg by mouth daily.    Marland Kitchen gabapentin (NEURONTIN) 300 MG capsule Take 300 mg by mouth at bedtime.    . insulin aspart (NOVOLOG FLEXPEN) 100 UNIT/ML FlexPen Inject 18-24 Units into the skin 3 (three) times daily with meals. (Patient taking differently: Inject 15-24 Units into the skin 3 (three) times daily with meals. ) 30 mL 2  . Insulin Glargine (BASAGLAR KWIKPEN) 100 UNIT/ML SOPN INJECT 80 UNITS INTO THE SKIN DAILY AT 10 PM 30 mL 2  . metFORMIN (GLUCOPHAGE) 500 MG tablet Take 1 tablet (500 mg total) by mouth 2 (two) times daily with a meal. 60 tablet 3  . metoprolol (LOPRESSOR) 50 MG tablet Take 25 mg by mouth 2 (two) times daily.      . nitroGLYCERIN (NITROSTAT) 0.4 MG SL tablet Place 1 tablet (0.4 mg total) under the tongue every 5 (five) minutes as needed. 25 tablet 3  . ONETOUCH VERIO test strip USE FOUR TIMES DAILY AS DIRECTED 450 each 0  . oxyCODONE (ROXICODONE) 15 MG immediate release tablet Take 1 tablet (15 mg total) by mouth every 4 (four) hours as needed for pain. 30 tablet 0  . pantoprazole (PROTONIX) 40 MG tablet TAKE 1 TABLET BY MOUTH BEFORE BREAKFAST 30 tablet 11  . simvastatin (ZOCOR) 10 MG tablet TAKE 1 TABLET BY MOUTH EVERY NIGHT AT BEDTIME 30 tablet 2  . tamsulosin (FLOMAX) 0.4 MG CAPS capsule Take 1 capsule by mouth every evening.  12  . VICTOZA 18 MG/3ML SOPN ADMINISTER 1.8 MG UNDER THE SKIN EVERY DAY 9 mL 2  . zolpidem (AMBIEN) 10 MG tablet Take 10 mg by mouth at bedtime as  needed for sleep.     No current facility-administered medications for this visit.     Allergies  Allergen Reactions  . No Known Allergies     Review of Systems  Wants to start cardiac rehabilitation at [facility]  BP 124/62 (BP Location: Right Arm, Patient Position: Sitting, Cuff Size: Large)   Pulse 70   Resp 16   Ht 6\' 3"  (1.905 m)   Wt (!) 336 lb (152.4 kg)   SpO2 97% Comment: ON RA  BMI 42.00 kg/m  Physical Exam  Exam    General- alert and comfortable   Lungs- clear without rales, wheezes   Cor- regular rate and rhythm, no murmur , gallop   Abdomen- soft, non-tender   Extremities - warm, non-tender, minimal edema   Neuro- oriented, appropriate, no focal weakness   Diagnostic Tests: None  Impression: After October 1 there are no restrictions on his activity He has maintained sinus rhythm and can stop his anticoagulation with eliquis   Plan: He will return as needed. He has appointment with his cardiologist Dr. Domenic Polite for follow-up. Importance of heart healthy diet and lifestyle reviewed with patient and wife.  Len Childs, MD [facility] [phone]

## 2017-01-25 ENCOUNTER — Other Ambulatory Visit: Payer: Self-pay | Admitting: "Endocrinology

## 2017-02-04 ENCOUNTER — Other Ambulatory Visit: Payer: Self-pay | Admitting: "Endocrinology

## 2017-03-03 LAB — HEMOGLOBIN A1C
EAG (MMOL/L): 10.6 (calc)
Hgb A1c MFr Bld: 8.3 % of total Hgb — ABNORMAL HIGH (ref <5.7)
MEAN PLASMA GLUCOSE: 192 (calc)

## 2017-03-03 LAB — LIPID PANEL
Cholesterol: 107 mg/dL (ref <200)
HDL: 39 mg/dL — AB (ref >40)
LDL Cholesterol (Calc): 44 mg/dL (calc)
Non-HDL Cholesterol (Calc): 68 mg/dL (calc) (ref <130)
TRIGLYCERIDES: 164 mg/dL — AB (ref <150)
Total CHOL/HDL Ratio: 2.7 (calc) (ref <5.0)

## 2017-03-03 LAB — RENAL FUNCTION PANEL
ALBUMIN MSPROF: 3.6 g/dL (ref 3.6–5.1)
BUN: 15 mg/dL (ref 7–25)
CHLORIDE: 101 mmol/L (ref 98–110)
CO2: 29 mmol/L (ref 20–32)
CREATININE: 1.26 mg/dL (ref 0.70–1.33)
Calcium: 8.9 mg/dL (ref 8.6–10.3)
Glucose, Bld: 215 mg/dL — ABNORMAL HIGH (ref 65–99)
PHOSPHORUS: 3 mg/dL (ref 2.5–4.5)
Potassium: 4.2 mmol/L (ref 3.5–5.3)
Sodium: 136 mmol/L (ref 135–146)

## 2017-03-06 ENCOUNTER — Encounter: Payer: Self-pay | Admitting: "Endocrinology

## 2017-03-06 ENCOUNTER — Ambulatory Visit (INDEPENDENT_AMBULATORY_CARE_PROVIDER_SITE_OTHER): Payer: BC Managed Care – PPO | Admitting: "Endocrinology

## 2017-03-06 ENCOUNTER — Encounter (HOSPITAL_COMMUNITY)
Admission: RE | Admit: 2017-03-06 | Discharge: 2017-03-06 | Disposition: A | Discharge status: Home / Self Care | Payer: BC Managed Care – PPO | Source: Ambulatory Visit | Attending: Cardiology | Admitting: Cardiology

## 2017-03-06 VITALS — BP 133/80 | HR 81 | Ht 75.0 in | Wt 339.0 lb

## 2017-03-06 VITALS — BP 136/76 | HR 81 | Ht 75.0 in | Wt 335.5 lb

## 2017-03-06 DIAGNOSIS — E782 Mixed hyperlipidemia: Secondary | ICD-10-CM | POA: Diagnosis not present

## 2017-03-06 DIAGNOSIS — Z951 Presence of aortocoronary bypass graft: Secondary | ICD-10-CM | POA: Diagnosis present

## 2017-03-06 DIAGNOSIS — I10 Essential (primary) hypertension: Secondary | ICD-10-CM | POA: Diagnosis not present

## 2017-03-06 DIAGNOSIS — E1159 Type 2 diabetes mellitus with other circulatory complications: Secondary | ICD-10-CM

## 2017-03-06 MED ORDER — FREESTYLE LIBRE READER DEVI: 1.0000 | Freq: Once | 0 refills | Status: AC

## 2017-03-06 MED ORDER — FREESTYLE LIBRE SENSOR SYSTEM MISC
2 refills | Status: DC
Start: 2017-03-06 — End: 2022-05-08

## 2017-03-06 MED ORDER — INSULIN ASPART 100 UNIT/ML FLEXPEN: 18.0000 [IU] | PEN_INJECTOR | Freq: Three times a day (TID) | SUBCUTANEOUS | 2 refills | Status: DC

## 2017-03-06 NOTE — Patient Instructions (Signed)

## 2017-03-06 NOTE — Progress Notes (Signed)
Subjective:    Patient [name], male    DOB: 05-06-1956, PCP Sinda Du, MD   Past Medical History:  Diagnosis Date  . Anxiety   . Arthritis   . Coronary atherosclerosis of native coronary artery    BMS LAD and PTCA RCA 2000,BMS RCA/PLA 2001  . Diabetes mellitus   . Diverticulitis   . Fatty liver   . Gastric ulcer   . GERD (gastroesophageal reflux disease)   . Heart murmur    as a teenager  . HTN (hypertension)   . Hyperlipidemia   . Low-grade NHL (non-Hodgkin's lymphoma) 2007  . Lupus anticoagulant positive   . MI (myocardial infarction) ([place]) 2001  . Neuropathy    peripheral  . Type 2 diabetes mellitus ([place])   . Umbilical hernia    Past Surgical History:  Procedure Laterality Date  . Axillary abscess     left incision and drainage left  . BACK SURGERY     multiple  . BIOPSY  08/15/2014   Procedure: BIOPSY;  Surgeon: Danie Binder, MD;  [facility];  Service: Endoscopy;;  Gastric  . BIOPSY  01/19/2015   Procedure: BIOPSY (GASTRIC ULCER);  Surgeon: Danie Binder, MD;  [facility];  Service: Endoscopy;;  . CARDIAC CATHETERIZATION    . cardiac stents     x5 stents  . COLONOSCOPY  June 2008   Dr. Dellis Filbert Medoff: Mild left colonic diverticulosis  . CORONARY ARTERY BYPASS GRAFT N/A 11/11/2016   Procedure: CORONARY ARTERY BYPASS GRAFTING (CABG) x 3, USING LEFT MAMMARY ARTERY AND RIGHT GREATER SAPHENOUS VEIN HARVESTED ENDOSCOPICALLY;  Surgeon: Ivin Poot, MD;  Location: [place];  Service: Open Heart Surgery;  Laterality: N/A;  . Dental extractions    . ESOPHAGOGASTRODUODENOSCOPY  June 2008   Dr. Dellis Filbert Medoff: Gastric ulcer, antral, biopsy with reactive changes associated glandular atrophy, no H pylori. No features of lymphoma. Likely NSAID related  . ESOPHAGOGASTRODUODENOSCOPY  August 2008   Dr. Dellis Filbert Medoff: Complete healing of gastric ulcers.  . ESOPHAGOGASTRODUODENOSCOPY (EGD) WITH PROPOFOL N/A 08/15/2014   SLF: 1. Abnormal pain due  to ulcers & gastriitis   . ESOPHAGOGASTRODUODENOSCOPY (EGD) WITH PROPOFOL N/A 01/19/2015   SLF: 1. Persistant ulcer with firm base 2. single gastric polyp found in the gastric antrum. Ulcer base   . EUS N/A 03/21/2015   Thickened gastric wall in pre-pyloric antrum, no obvious intramural mass, fatty pancreas, no obvious pancreatic mass.   . Inguinal lymph node biopsy     right inguinal lymph node biopsy  . INTRAVASCULAR PRESSURE WIRE/FFR STUDY N/A 09/26/2016   Procedure: Intravascular Pressure Wire/FFR Study;  Surgeon: Nelva Bush, MD;  Location: [place] [facility];  Service: Cardiovascular;  Laterality: N/A;  . LEFT HEART CATH AND CORONARY ANGIOGRAPHY N/A 09/26/2016   Procedure: Left Heart Cath and Coronary Angiography;  Surgeon: Nelva Bush, MD;  Location: [place] [facility];  Service: Cardiovascular;  Laterality: N/A;  . NECK SURGERY    . POLYPECTOMY  01/19/2015   Procedure: POLYPECTOMY (GASTRIC);  Surgeon: Danie Binder, MD;  [facility];  Service: Endoscopy;;  . SHOULDER ARTHROSCOPY Left   . TEE WITHOUT CARDIOVERSION N/A 11/11/2016   Procedure: TRANSESOPHAGEAL ECHOCARDIOGRAM (TEE);  Surgeon: Prescott Gum, Collier Salina, MD;  Location: [place];  Service: Open Heart Surgery;  Laterality: N/A;   Social History   Social History  . Marital status: Married    Spouse name: N/A  . Number of children: N/A  .  Years of education: N/A   Social History Main Topics  . Smoking status: Former Smoker    Packs/day: 1.00    Years: 23.00    Types: Cigarettes    Start date: 10/30/1970    Quit date: 10/29/1993  . Smokeless tobacco: Former Systems developer    Types: Snuff, Chew  . Alcohol use No  . Drug use: No  . Sexual activity: No   Other Topics Concern  . None   Social History Narrative  . None   Outpatient Encounter Prescriptions as of 03/06/2017  Medication Sig  . aspirin EC 81 MG tablet Take 81 mg by mouth daily.  . cholecalciferol (VITAMIN D) 1000 units tablet Take 1,000 Units by mouth daily.  .  Continuous Blood Gluc Receiver (FREESTYLE LIBRE READER) DEVI 1 Piece by Does not apply route once.  . Continuous Blood Gluc Sensor (FREESTYLE LIBRE SENSOR SYSTEM) MISC Use one sensor every 10 days.  Marland Kitchen FLUoxetine (PROZAC) 20 MG capsule Take 20 mg by mouth daily.  Marland Kitchen gabapentin (NEURONTIN) 300 MG capsule Take 300 mg by mouth at bedtime.  . insulin aspart (NOVOLOG FLEXPEN) 100 UNIT/ML FlexPen Inject 18-24 Units into the skin 3 (three) times daily with meals.  . Insulin Glargine (BASAGLAR KWIKPEN) 100 UNIT/ML SOPN INJECT 80 UNITS INTO THE SKIN DAILY AT 10 PM  . metFORMIN (GLUCOPHAGE) 500 MG tablet Take 1 tablet (500 mg total) by mouth 2 (two) times daily with a meal.  . metoprolol (LOPRESSOR) 50 MG tablet Take 25 mg by mouth 2 (two) times daily.    . nitroGLYCERIN (NITROSTAT) 0.4 MG SL tablet Place 1 tablet (0.4 mg total) under the tongue every 5 (five) minutes as needed.  Glory Rosebush VERIO test strip USE FOUR TIMES DAILY AS DIRECTED  . oxyCODONE (ROXICODONE) 15 MG immediate release tablet Take 1 tablet (15 mg total) by mouth every 4 (four) hours as needed for pain.  . pantoprazole (PROTONIX) 40 MG tablet TAKE 1 TABLET BY MOUTH BEFORE BREAKFAST  . simvastatin (ZOCOR) 10 MG tablet TAKE 1 TABLET BY MOUTH EVERY NIGHT AT BEDTIME  . tamsulosin (FLOMAX) 0.4 MG CAPS capsule Take 1 capsule by mouth every evening.  Marland Kitchen VICTOZA 18 MG/3ML SOPN ADMINISTER 1.8 MG UNDER THE SKIN EVERY DAY  . [DISCONTINUED] apixaban (ELIQUIS) 5 MG TABS tablet Take 1 tablet (5 mg total) by mouth 2 (two) times daily.  . [DISCONTINUED] insulin aspart (NOVOLOG FLEXPEN) 100 UNIT/ML FlexPen Inject 18-24 Units into the skin 3 (three) times daily with meals. (Patient taking differently: Inject 15-24 Units into the skin 3 (three) times daily with meals. )  . [DISCONTINUED] zolpidem (AMBIEN) 10 MG tablet Take 10 mg by mouth at bedtime as needed for sleep.   No facility-administered encounter medications on file as of 03/06/2017.     ALLERGIES: Allergies  Allergen Reactions  . No Known Allergies    VACCINATION STATUS:  There is no immunization history on file for this patient.  Diabetes  He presents for his follow-up diabetic visit. He has type 2 diabetes mellitus. Onset time: He was diagnosed at approximate age of 34 years. His disease course has been stable. There are no hypoglycemic associated symptoms. Pertinent negatives for hypoglycemia include no confusion, headaches, pallor or seizures. There are no diabetic associated symptoms. Pertinent negatives for diabetes include no chest pain, no fatigue, no polydipsia, no polyphagia, no polyuria and no weakness. Symptoms are stable. Diabetic complications include heart disease and nephropathy. Risk factors for coronary artery disease include diabetes mellitus, dyslipidemia,  hypertension, male sex, obesity, sedentary lifestyle and tobacco exposure. Current diabetic treatment includes intensive insulin program and oral agent (dual therapy). He is compliant with treatment most of the time. His weight is increasing steadily. He is following a generally unhealthy diet. When asked about meal planning, he reported none. Prior visit with dietitian: Declines dietitian referral. His breakfast blood glucose range is generally 180-200 mg/dl. His lunch blood glucose range is generally 180-200 mg/dl. His dinner blood glucose range is generally 180-200 mg/dl. His bedtime blood glucose range is generally 180-200 mg/dl. His overall blood glucose range is 180-200 mg/dl. An ACE inhibitor/angiotensin II receptor blocker is being taken.  Hyperlipidemia  This is a chronic problem. The current episode started more than 1 year ago. Exacerbating diseases include diabetes and obesity. Pertinent negatives include no chest pain, myalgias or shortness of breath. Current antihyperlipidemic treatment includes statins. Risk factors for coronary artery disease include diabetes mellitus, dyslipidemia,  hypertension, male sex and a sedentary lifestyle.  Hypertension  This is a chronic problem. The current episode started more than 1 year ago. The problem is controlled. Pertinent negatives include no chest pain, headaches, neck pain, palpitations or shortness of breath. Past treatments include angiotensin blockers. The current treatment provides moderate improvement. Hypertensive end-organ damage includes CAD/MI.    Review of Systems  Constitutional: Negative for fatigue and unexpected weight change.  HENT: Negative for dental problem, mouth sores and trouble swallowing.   Eyes: Negative for visual disturbance.  Respiratory: Negative for cough, choking, chest tightness, shortness of breath and wheezing.   Cardiovascular: Negative for chest pain, palpitations and leg swelling.  Gastrointestinal: Negative for abdominal distention, abdominal pain, constipation, diarrhea, nausea and vomiting.  Endocrine: Negative for polydipsia, polyphagia and polyuria.  Genitourinary: Negative for dysuria, flank pain, hematuria and urgency.  Musculoskeletal: Negative for back pain, gait problem, myalgias and neck pain.  Skin: Negative for pallor, rash and wound.  Neurological: Negative for seizures, syncope, weakness, numbness and headaches.  Psychiatric/Behavioral: Negative.  Negative for confusion and dysphoric mood.    Objective:    BP 133/80   Pulse 81   Ht 6\' 3"  (1.905 m)   Wt (!) 339 lb (153.8 kg)   BMI 42.37 kg/m   Wt Readings from Last 3 Encounters:  03/06/17 (!) 339 lb (153.8 kg)  01/21/17 (!) 336 lb (152.4 kg)  12/24/16 (!) 328 lb (148.8 kg)    Physical Exam  Constitutional: He is oriented to person, place, and time. He appears well-developed and well-nourished. He is cooperative. No distress.  HENT:  Head: Normocephalic and atraumatic.  Eyes: EOM are normal.  Neck: Normal range of motion. Neck supple. No tracheal deviation present. No thyromegaly present.  Cardiovascular: Normal rate,  S1 normal, S2 normal and normal heart sounds.  Exam reveals no gallop.   No murmur heard. Pulses:      Dorsalis pedis pulses are 1+ on the right side, and 1+ on the left side.       Posterior tibial pulses are 1+ on the right side, and 1+ on the left side.  Pulmonary/Chest: Breath sounds normal. No respiratory distress. He has no wheezes.  Abdominal: Soft. Bowel sounds are normal. He exhibits no distension. There is no tenderness. There is no guarding and no CVA tenderness.  Musculoskeletal: He exhibits no edema.       Right shoulder: He exhibits no swelling and no deformity.  Neurological: He is alert and oriented to person, place, and time. He has normal strength and normal reflexes.  No cranial nerve deficit or sensory deficit. Gait normal.  Skin: Skin is warm and dry. No rash noted. No cyanosis. Nails show no clubbing.  Psychiatric: He has a normal mood and affect. His speech is normal and behavior is normal. Judgment and thought content normal. Cognition and memory are normal.     Diabetic Labs (most recent): Lab Results  Component Value Date   HGBA1C 8.3 (H) 03/02/2017   HGBA1C 8.4 (H) 11/07/2016   HGBA1C 7.1 (H) 08/11/2016   Lipid Panel     Component Value Date/Time   CHOL 107 03/02/2017 1615   TRIG 164 (H) 03/02/2017 1615   HDL 39 (L) 03/02/2017 1615   CHOLHDL 2.7 03/02/2017 1615   VLDL 56 (H) 01/28/2016 1602   LDLCALC 41 01/28/2016 1602   CMP     Component Value Date/Time   NA 136 03/02/2017 1615   K 4.2 03/02/2017 1615   CL 101 03/02/2017 1615   CO2 29 03/02/2017 1615   GLUCOSE 215 (H) 03/02/2017 1615   BUN 15 03/02/2017 1615   CREATININE 1.26 03/02/2017 1615   CALCIUM 8.9 03/02/2017 1615   PROT 6.3 (L) 11/18/2016 0430   ALBUMIN 2.4 (L) 11/18/2016 0430   AST 28 11/18/2016 0430   ALT 22 11/18/2016 0430   ALKPHOS 71 11/18/2016 0430   BILITOT 0.9 11/18/2016 0430   GFRNONAA 59 (L) 11/20/2016 0450   GFRNONAA 65 10/06/2016 1235   GFRAA >60 11/20/2016 0450    GFRAA 75 10/06/2016 1235      Assessment & Plan:   1. 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 These are all discussed in detail with the patient.  -  Recently, during evaluation and screening for the surgery he was found to have significant coronary artery disease which led to quadruple bypass coronary artery surgery. - He is currently recovering from the surgery.  - His repeat labs show A1c  unchanged at 8.3%. He admits to dietary indiscretion.   Glucose logs and insulin administration records pertaining to this visit,  to be scanned into patient's records.  Recent labs reviewed.   - I have re-counseled the patient on diet management and weight loss  by adopting a carbohydrate restricted / protein rich  Diet.  -  Suggestion is made for him to avoid simple carbohydrates  from his diet including Cakes, Sweet Desserts / Pastries, Ice Cream, Soda (diet and regular), Sweet Tea, Candies, Chips, Cookies, Store Bought Juices, Alcohol in Excess of  1-2 drinks a day, Artificial Sweeteners, and "Sugar-free" Products. This will help patient to have stable blood glucose profile and potentially avoid unintended weight gain.   - Patient is advised to stick to a routine mealtimes to eat 3 meals  a day and avoid unnecessary snacks ( to snack only to correct hypoglycemia).  - The patient  Declines it with CDE.  - I have approached patient with the following individualized plan to manage diabetes and patient agrees.  -Continue  basal insulin Basaglar  80 units qhs, and increase   Novolog   to 18 units  3 times a day before meals  for pre-meal BG readings of 90-150mg /dl, plus patient specific correction dose of rapid acting insulin for unexpected hyperglycemia above 150mg /dl, associated  with strict monitoring of BG AC and HS.  -  Adjustment parameters for hypo and hyperglycemia were given in a written document to patient. -Patient is encouraged to call clinic for blood glucose levels less than 70 or above 300 mg /dl.  I will continue  metformin  500 mg po  twice a day , and continue  Victoza  1.8 mg sq daily to advance as tolerated. - He'll benefit from continuous glucose monitoring. I discussed and initiated a prescription for the [facility] device for him.  - Patient specific target  for A1c; LDL, HDL, Triglycerides, and  Waist Circumference were discussed in detail.  2) BP/HTN:  Controlled.I advised him to continue current medications  including ACEI/ARB. 3) Lipids/HPL:  continue statins. 4)  Weight/Diet:  He lost 11 pounds during the process of coronary artery bypass graft surgery, CDE consult in progress, exercise, and carbohydrates information provided. He is recovering from coronary artery bypass surgery. He plans to pursue the bariatric surgery after proper rehabilitation. Given his relatively young age, he would still benefit from bariatric surgery and I encouraged him to continue follow-up with the bariatric group.  5) Chronic Care/Health Maintenance:  -Patient is on ACEI/ARB and Statin medications and encouraged to continue to follow up with Ophthalmology, cardiology, Podiatrist at least yearly or according to recommendations, and advised to  stay away from smoking. I have recommended yearly flu vaccine and pneumonia vaccination at least every 5 years; moderate intensity exercise for up to 150 minutes weekly; and  sleep for at least 7 hours a day.  - Time spent with the patient: 25 min, of which >50% was spent in reviewing his sugar logs , discussing his hypo- and hyper-glycemic episodes, reviewing his current and  previous labs and insulin doses and developing a plan to avoid hypo- and hyper-glycemia.    - I advised patient to maintain close follow up with  Sinda Du, MD for primary care needs.  Follow up plan: -Return in about 3 months (around 06/06/2017) for meter, and logs.  Glade Lloyd, MD Phone: [phone]  Fax: [phone]  -  This note was partially dictated with voice recognition software. Similar sounding words can be transcribed inadequately or may not  be corrected upon review.  03/06/2017, 11:31 AM

## 2017-03-06 NOTE — Progress Notes (Signed)
Daily Session Note  Patient Details  Name: Kelly [name] MRN: [id number] Date of Birth: [birth year] Referring Provider:     CARDIAC REHAB PHASE II ORIENTATION from 03/06/2017 in [place]  Referring Provider  Dr. Domenic Polite      Encounter Date: 03/06/2017  Check In:     Session Check In - 03/06/17 1230      Check-In   Location [facility]   Staff Present Roben Tatsch Angelina Pih, MS, EP, [facility], Exercise Physiologist;Gregory Luther Parody, BS, EP, Exercise Physiologist;Debra Wynetta Emery, RN, BSN   Medication changes reported     No   Fall or balance concerns reported    No   Tobacco Cessation --  Quit 1998   Warm-up and Cool-down Performed as group-led Location manager Performed Yes   VAD Patient? No     Pain Assessment   Currently in Pain? Yes   Pain Score 3    Pain Location Knee   Pain Orientation Left   Pain Descriptors / Indicators Aching   Pain Type Chronic pain   Pain Radiating Towards None   Pain Onset More than a month ago   Pain Relieving Factors Medication, walking, and stretching   Effect of Pain on Daily Activities None   Multiple Pain Sites No      Capillary Blood Glucose: No results found for this or any previous visit (from the past 24 hour(s)).    History  Smoking Status  . Former Smoker  . Packs/day: 1.00  . Years: 23.00  . Types: Cigarettes  . Start date: 10/30/1970  . Quit date: 10/29/1993  Smokeless Tobacco  . Former Systems developer  . Types: Snuff, Chew    Goals Met:  Independence with exercise equipment Improved SOB with ADL's Exercise tolerated well No report of cardiac concerns or symptoms Strength training completed today  Goals Unmet:  Not Applicable  Comments: Check out: 1430   Dr. Kate Sable is Medical Director for [place] and Pulmonary Rehab.

## 2017-03-06 NOTE — Progress Notes (Signed)
Cardiac Individual Treatment Plan  Patient Details  Name: Kelly [name] MRN: [id number] Date of Birth: May 27, 1955 Referring Provider:     CARDIAC REHAB PHASE II ORIENTATION from 03/06/2017 in [place]  Referring Provider  Dr. Domenic Polite      Initial Encounter Date:    CARDIAC REHAB PHASE II ORIENTATION from 03/06/2017 in [place]  Date  03/06/17  Referring Provider  Dr. Domenic Polite      Visit Diagnosis: S/P CABG x 3  Patient's Home Medications on Admission:  Current Outpatient Prescriptions:  .  acetaminophen (TYLENOL) 325 MG tablet, Take 650 mg by mouth every 6 (six) hours as needed for mild pain., Disp: , Rfl:  .  aspirin EC 81 MG tablet, Take 81 mg by mouth daily., Disp: , Rfl:  .  Continuous Blood Gluc Receiver (FREESTYLE LIBRE READER) DEVI, 1 Piece by Does not apply route once., Disp: 1 Device, Rfl: 0 .  Continuous Blood Gluc Sensor (Loaza) MISC, Use one sensor every 10 days., Disp: 3 each, Rfl: 2 .  FLUoxetine (PROZAC) 20 MG capsule, Take 20 mg by mouth daily., Disp: , Rfl:  .  gabapentin (NEURONTIN) 300 MG capsule, Take 300 mg by mouth at bedtime., Disp: , Rfl:  .  insulin aspart (NOVOLOG FLEXPEN) 100 UNIT/ML FlexPen, Inject 18-24 Units into the skin 3 (three) times daily with meals., Disp: 30 mL, Rfl: 2 .  Insulin Glargine (BASAGLAR KWIKPEN) 100 UNIT/ML SOPN, INJECT 80 UNITS INTO THE SKIN DAILY AT 10 PM, Disp: 30 mL, Rfl: 3 .  metFORMIN (GLUCOPHAGE) 500 MG tablet, Take 1 tablet (500 mg total) by mouth 2 (two) times daily with a meal., Disp: 60 tablet, Rfl: 3 .  metoprolol (LOPRESSOR) 50 MG tablet, Take 25 mg by mouth 2 (two) times daily.  , Disp: , Rfl:  .  nitroGLYCERIN (NITROSTAT) 0.4 MG SL tablet, Place 1 tablet (0.4 mg total) under the tongue every 5 (five) minutes as needed., Disp: 25 tablet, Rfl: 3 .  ONETOUCH VERIO test strip, USE FOUR TIMES DAILY AS DIRECTED, Disp: 450 each, Rfl: 0 .  oxyCODONE  (ROXICODONE) 15 MG immediate release tablet, Take 1 tablet (15 mg total) by mouth every 4 (four) hours as needed for pain., Disp: 30 tablet, Rfl: 0 .  pantoprazole (PROTONIX) 40 MG tablet, TAKE 1 TABLET BY MOUTH BEFORE BREAKFAST, Disp: 30 tablet, Rfl: 11 .  simvastatin (ZOCOR) 10 MG tablet, TAKE 1 TABLET BY MOUTH EVERY NIGHT AT BEDTIME, Disp: 30 tablet, Rfl: 2 .  tamsulosin (FLOMAX) 0.4 MG CAPS capsule, Take 1 capsule by mouth every evening., Disp: , Rfl: 12 .  VICTOZA 18 MG/3ML SOPN, ADMINISTER 1.8 MG UNDER THE SKIN EVERY DAY, Disp: 9 mL, Rfl: 3  Past Medical History: Past Medical History:  Diagnosis Date  . Anxiety   . Arthritis   . Coronary atherosclerosis of native coronary artery    BMS LAD and PTCA RCA 2000,BMS RCA/PLA 2001  . Diabetes mellitus   . Diverticulitis   . Fatty liver   . Gastric ulcer   . GERD (gastroesophageal reflux disease)   . Heart murmur    as a teenager  . HTN (hypertension)   . Hyperlipidemia   . Low-grade NHL (non-Hodgkin's lymphoma) 2007  . Lupus anticoagulant positive   . MI (myocardial infarction) ([place]) 2001  . Neuropathy    peripheral  . Type 2 diabetes mellitus ([place])   . Umbilical hernia     Tobacco Use: History  Smoking Status  . Former Smoker  . Packs/day: 1.00  . Years: 23.00  . Types: Cigarettes  . Start date: 10/30/1970  . Quit date: 10/29/1993  Smokeless Tobacco  . Former Systems developer  . Types: Snuff, Chew    Labs: Recent Review Flowsheet Data    Labs for ITP Cardiac and Pulmonary Rehab Latest Ref Rng & Units 11/17/2016 11/17/2016 11/17/2016 11/18/2016 03/02/2017   Cholestrol <200 mg/dL - - - - 107   LDLCALC <130 mg/dL - - - - -   HDL >40 mg/dL - - - - 39(L)   Trlycerides <150 mg/dL - - - - 164(H)   Hemoglobin A1c <5.7 % of total Hgb - - - - 8.3(H)   PHART 7.350 - 7.450 - - - - -   PCO2ART 32.0 - 48.0 mmHg - - - - -   HCO3 20.0 - 28.0 mmol/L - - - - -   TCO2 0 - 100 mmol/L - 28 35 - -   ACIDBASEDEF 0.0 - 2.0 mmol/L - - - - -   O2SAT % 59.0  - - 64.3 -      Capillary Blood Glucose: Lab Results  Component Value Date   GLUCAP 188 (H) 11/21/2016   GLUCAP 211 (H) 11/21/2016   GLUCAP 99 11/20/2016   GLUCAP 175 (H) 11/20/2016   GLUCAP 251 (H) 11/20/2016     Exercise Target Goals: Date: 03/06/17  Exercise Program Goal: Individual exercise prescription set with THRR, safety & activity barriers. Participant demonstrates ability to understand and report RPE using BORG scale, to self-measure pulse accurately, and to acknowledge the importance of the exercise prescription.  Exercise Prescription Goal: Starting with aerobic activity 30 plus minutes a day, 3 days per week for initial exercise prescription. Provide home exercise prescription and guidelines that participant acknowledges understanding prior to discharge.  Activity Barriers & Risk Stratification:     Activity Barriers & Cardiac Risk Stratification - 03/06/17 1340      Activity Barriers & Cardiac Risk Stratification   Activity Barriers Other (comment)  may experience (L) knee pain   Cardiac Risk Stratification High      6 Minute Walk:     6 Minute Walk    Row Name 03/06/17 1336         6 Minute Walk   Phase Initial     Distance 1050 feet     Walk Time 6 minutes     # of Rest Breaks 1     MPH 1.98     METS 2.52     RPE 12     Perceived Dyspnea  11     VO2 Peak 7.61     Symptoms Yes (comment)     Comments Left knee pain 3/10. SOB near end of test. Knee pain 0/10 after rest     Resting HR 81 bpm     Resting BP 136/76     Resting Oxygen Saturation  97 %     Exercise Oxygen Saturation  during 6 min walk 93 %     Max Ex. HR 93 bpm     Max Ex. BP 146/74     2 Minute Post BP 134/68        Oxygen Initial Assessment:   Oxygen Re-Evaluation:   Oxygen Discharge (Final Oxygen Re-Evaluation):   Initial Exercise Prescription:     Initial Exercise Prescription - 03/06/17 1400      Date of Initial Exercise RX and Referring Provider   Date  03/06/17   Referring Provider Dr. Domenic Polite     Recumbant Elliptical   Level 1   RPM 34   Watts 30   Minutes 15   METs 1.6     T5 Nustep   Level 2   SPM 53   Minutes 20   METs 1.9     Prescription Details   Frequency (times per week) 3   Duration Progress to 30 minutes of continuous aerobic without signs/symptoms of physical distress     Intensity   THRR 40-80% of Max Heartrate [phone]   Ratings of Perceived Exertion 11-13   Perceived Dyspnea 0-4     Progression   Progression Continue progressive overload as per policy without signs/symptoms or physical distress.     Resistance Training   Training Prescription Yes   Weight 1   Reps 10-15      Perform Capillary Blood Glucose checks as needed.  Exercise Prescription Changes:   Exercise Comments:   Exercise Goals and Review:     Exercise Goals    Row Name 03/06/17 1341             Exercise Goals   Increase Physical Activity Yes       Intervention Provide advice, education, support and counseling about physical activity/exercise needs.;Develop an individualized exercise prescription for aerobic and resistive training based on initial evaluation findings, risk stratification, comorbidities and participant's personal goals.       Expected Outcomes Achievement of increased cardiorespiratory fitness and enhanced flexibility, muscular endurance and strength shown through measurements of functional capacity and personal statement of participant.       Increase Strength and Stamina Yes       Intervention Provide advice, education, support and counseling about physical activity/exercise needs.;Develop an individualized exercise prescription for aerobic and resistive training based on initial evaluation findings, risk stratification, comorbidities and participant's personal goals.       Expected Outcomes Achievement of increased cardiorespiratory fitness and enhanced flexibility, muscular endurance and strength shown  through measurements of functional capacity and personal statement of participant.       Able to understand and use rate of perceived exertion (RPE) scale Yes       Intervention Provide education and explanation on how to use RPE scale       Expected Outcomes Short Term: Able to use RPE daily in rehab to express subjective intensity level;Long Term:  Able to use RPE to guide intensity level when exercising independently       Able to understand and use Dyspnea scale Yes       Intervention Provide education and explanation on how to use Dyspnea scale       Expected Outcomes Short Term: Able to use Dyspnea scale daily in rehab to express subjective sense of shortness of breath during exertion;Long Term: Able to use Dyspnea scale to guide intensity level when exercising independently       Knowledge and understanding of Target Heart Rate Range (THRR) Yes       Intervention Provide education and explanation of THRR including how the numbers were predicted and where they are located for reference       Expected Outcomes Short Term: Able to state/look up THRR;Long Term: Able to use THRR to govern intensity when exercising independently       Able to check pulse independently Yes       Intervention Provide education and demonstration on how to check pulse in carotid and radial arteries.;Review the importance  of being able to check your own pulse for safety during independent exercise       Expected Outcomes Short Term: Able to explain why pulse checking is important during independent exercise;Long Term: Able to check pulse independently and accurately       Understanding of Exercise Prescription Yes       Intervention Provide education, explanation, and written materials on patient's individual exercise prescription       Expected Outcomes Short Term: Able to explain program exercise prescription;Long Term: Able to explain home exercise prescription to exercise independently          Exercise Goals  Re-Evaluation :    Discharge Exercise Prescription (Final Exercise Prescription Changes):   Nutrition:  Target Goals: Understanding of nutrition guidelines, daily intake of sodium 1500mg , cholesterol 200mg , calories 30% from fat and 7% or less from saturated fats, daily to have 5 or more servings of fruits and vegetables.  Biometrics:     Pre Biometrics - 03/06/17 1506      Pre Biometrics   Height 6\' 3"  (1.905 m)   Weight (!)  335 lb 8 oz (152.2 kg)   Waist Circumference 50 inches   Hip Circumference 48 inches   Waist to Hip Ratio 1.04 %   BMI (Calculated) 41.93   Triceps Skinfold 20 mm   % Body Fat 37.4 %   Grip Strength 93.3 kg   Flexibility 0 in   Single Leg Stand 6 seconds       Nutrition Therapy Plan and Nutrition Goals:     Nutrition Therapy & Goals - 03/06/17 1507      Personal Nutrition Goals   Personal Goal #2 Patient stated that he eats what he wants. He says he has cut back. He is seeing Dr. Dorris Fetch for his diabetes.    Additional Goals? No      Nutrition Discharge: Rate Your Plate Scores:     Nutrition Assessments - 03/06/17 1508      MEDFICTS Scores   Pre Score 66      Nutrition Goals Re-Evaluation:   Nutrition Goals Discharge (Final Nutrition Goals Re-Evaluation):   Psychosocial: Target Goals: Acknowledge presence or absence of significant depression and/or stress, maximize coping skills, provide positive support system. Participant is able to verbalize types and ability to use techniques and skills needed for reducing stress and depression.  Initial Review & Psychosocial Screening:     Initial Psych Review & Screening - 03/06/17 1509      Initial Review   Current issues with None Identified     Family Dynamics   Good Support System? Yes     Barriers   Psychosocial barriers to participate in program There are no identifiable barriers or psychosocial needs.     Screening Interventions   Interventions Encouraged to exercise       Quality of Life Scores:     Quality of Life - 03/06/17 1510      Quality of Life Scores   Health/Function Pre 22.91 %   Socioeconomic Pre 24.5 %   Psych/Spiritual Pre 23.17 %   Family Pre 21 %   GLOBAL Pre 23.04 %      PHQ-9: Recent Review Flowsheet Data    Depression screen [facility] 2/9 03/06/2017 03/06/2017 12/05/2016 08/22/2016 05/16/2016   Decreased Interest 0 0 0 0 0   Down, Depressed, Hopeless 0 0 0 0 0   PHQ - 2 Score 0 0 0 0 0   Altered sleeping 1 - - - -  Tired, decreased energy 1 - - - -   Change in appetite 1 - - - -   Feeling bad or failure about yourself  0 - - - -   Trouble concentrating 0 - - - -   Moving slowly or fidgety/restless 0 - - - -   Suicidal thoughts 0 - - - -   PHQ-9 Score 3 - - - -   Difficult doing work/chores Somewhat difficult - - - -     Interpretation of Total Score  Total Score Depression Severity:  1-4 = Minimal depression, 5-9 = Mild depression, 10-14 = Moderate depression, 15-19 = Moderately severe depression, 20-27 = Severe depression   Psychosocial Evaluation and Intervention:     Psychosocial Evaluation - 03/06/17 1509      Psychosocial Evaluation & Interventions   Interventions Encouraged to exercise with the program and follow exercise prescription   Continue Psychosocial Services  No Follow up required      Psychosocial Re-Evaluation:   Psychosocial Discharge (Final Psychosocial Re-Evaluation):   Vocational Rehabilitation: Provide vocational rehab assistance to qualifying candidates.   Vocational Rehab Evaluation & Intervention:     Vocational Rehab - 03/06/17 1454      Initial Vocational Rehab Evaluation & Intervention   Assessment shows need for Vocational Rehabilitation No      Education: Education Goals: Education classes will be provided on a weekly basis, covering required topics. Participant will state understanding/return demonstration of topics presented.  Learning Barriers/Preferences:      Learning Barriers/Preferences - 03/06/17 1454      Learning Barriers/Preferences   Learning Barriers Hearing   Learning Preferences Pictoral;Video;Individual Instruction;Skilled Demonstration      Education Topics: Hypertension, Hypertension Reduction -Define heart disease and high blood pressure. Discus how high blood pressure affects the body and ways to reduce high blood pressure.   Exercise and Your Heart -Discuss why it is important to exercise, the FITT principles of exercise, normal and abnormal responses to exercise, and how to exercise safely.   Angina -Discuss definition of angina, causes of angina, treatment of angina, and how to decrease risk of having angina.   Cardiac Medications -Review what the following cardiac medications are used for, how they affect the body, and side effects that may occur when taking the medications.  Medications include Aspirin, Beta blockers, calcium channel blockers, ACE Inhibitors, angiotensin receptor blockers, diuretics, digoxin, and antihyperlipidemics.   Congestive Heart Failure -Discuss the definition of CHF, how to live with CHF, the signs and symptoms of CHF, and how keep track of weight and sodium intake.   Heart Disease and Intimacy -Discus the effect sexual activity has on the heart, how changes occur during intimacy as we age, and safety during sexual activity.   Smoking Cessation / COPD -Discuss different methods to quit smoking, the health benefits of quitting smoking, and the definition of COPD.   Nutrition I: Fats -Discuss the types of cholesterol, what cholesterol does to the heart, and how cholesterol levels can be controlled.   Nutrition II: Labels -Discuss the different components of food labels and how to read food label   Heart Parts and Heart Disease -Discuss the anatomy of the heart, the pathway of blood circulation through the heart, and these are affected by heart disease.   Stress I: Signs and  Symptoms -Discuss the causes of stress, how stress may lead to anxiety and depression, and ways to limit stress.   Stress II: Relaxation -Discuss different types of relaxation techniques to  limit stress.   Warning Signs of Stroke / TIA -Discuss definition of a stroke, what the signs and symptoms are of a stroke, and how to identify when someone is having stroke.   Knowledge Questionnaire Score:     Knowledge Questionnaire Score - 03/06/17 1454      Knowledge Questionnaire Score   Pre Score 20/24      Core Components/Risk Factors/Patient Goals at Admission:     Personal Goals and Risk Factors at Admission - 03/06/17 1508      Core Components/Risk Factors/Patient Goals on Admission    Weight Management Weight Maintenance   Personal Goal Other Yes   Personal Goal Gain strength and to be able to do activities without SOB   Intervention Attend program 3 x week and supplement exercise at home 2 x week.    Expected Outcomes Reach personal goals      Core Components/Risk Factors/Patient Goals Review:    Core Components/Risk Factors/Patient Goals at Discharge (Final Review):    ITP Comments:     ITP Comments    Row Name 03/06/17 1457           ITP Comments Patient is a 68 yearl old maile who is coming to Cardiac Rehab due to CABGx3. 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 He is eager to get started.           Comments: Patient arrived for 1st visit/orientation/education at 1230. Patient was referred to CR by Dr. Domenic Polite due to CABGx3 (Z95.1). During orientation advised patient on arrival and appointment times what to wear, what to do before, during and after exercise. Reviewed attendance and class policy. Talked about inclement weather and class consultation policy. Pt is scheduled to return Cardiac Rehab on 03/09/17 at [place]. Pt was advised to come to class 15 minutes before class starts. Patient was also given instructions on meeting with the dietician  and attending the Family Structure classes. Discussed RPE/Dpysnea scales. Discussed initial THR and how to find their radial and/or carotid pulse. Discussed the initial exercise prescription and how this effects their progress. Pt is eager to get started. Patient participated in warm up stretches followed by light weights and resistance bands. Patient was able to complete 6 minute walk test. Patient c/o pain in (L) knee with 3/10 pain. Pain subsided to 0/10 at 2 minute rest. Pain was completely gone at the end of orientation. Patient was measured for the equipment. Discussed equipment safety with patient. Took patient pre-anthropometric measurements. Patient finished visit at 1430.

## 2017-03-06 NOTE — Progress Notes (Signed)
Cardiac/Pulmonary Rehab Medication Review by a Pharmacist  Does the patient  feel that his/her medications are working for him/her?  yes  Has the patient been experiencing any side effects to the medications prescribed?  no  Does the patient measure his/her own blood pressure or blood glucose at home?  yes   Does the patient have any problems obtaining medications due to transportation or finances?   no  Understanding of regimen: fair Understanding of indications: fair Potential of compliance: good  Questions asked to Determine Patient Understanding of Medication Regimen:  1. What is the name of the medication?  2. What is the medication used for?  3. When should it be taken?  4. How much should be taken?  5. How will you take it?  6. What side effects should you report?  Understanding Defined as: Excellent: All questions above are correct Good: Questions 1-4 are correct Fair: Questions 1-2 are correct  Poor: 1 or none of the above questions are correct   Pharmacist comments: Pt does not c/o any side effects from medications.  Pt does not have list or meds here today and some meds on Med Rec list are questionable (metformin for example).  Pt does check blood sugar 5 times per day but states BP is typically fine.  Pt states he will bring updated list next time to clarify.  Pt is not clear on all his medications and doses w/o wife present or updated list.    Kelly [name] A 03/06/2017 1:37 PM

## 2017-03-09 ENCOUNTER — Encounter (HOSPITAL_COMMUNITY)
Admission: RE | Admit: 2017-03-09 | Discharge: 2017-03-09 | Disposition: A | Discharge status: Home / Self Care | Payer: BC Managed Care – PPO | Source: Ambulatory Visit | Attending: Cardiology | Admitting: Cardiology

## 2017-03-09 DIAGNOSIS — Z951 Presence of aortocoronary bypass graft: Secondary | ICD-10-CM | POA: Diagnosis not present

## 2017-03-10 NOTE — Progress Notes (Signed)
Patient received the take home exercise plan today 03/09/2017. THR and safe ways to be active outside of CR were addressed. Patient demonstrated an understanding. Patient was encouraged to ask any future questions.

## 2017-03-11 ENCOUNTER — Encounter (HOSPITAL_COMMUNITY)
Admission: RE | Admit: 2017-03-11 | Discharge: 2017-03-11 | Disposition: A | Discharge status: Home / Self Care | Payer: BC Managed Care – PPO | Source: Ambulatory Visit | Attending: Cardiology | Admitting: Cardiology

## 2017-03-11 DIAGNOSIS — Z951 Presence of aortocoronary bypass graft: Secondary | ICD-10-CM

## 2017-03-11 NOTE — Progress Notes (Signed)
Daily Session Note  Patient Details  Name: Kelly [name] MRN: [id number] Date of Birth: 04-23-1956 Referring Provider:     CARDIAC REHAB PHASE II ORIENTATION from 03/06/2017 in [place]  Referring Provider  Dr. Domenic Polite      Encounter Date: 03/11/2017  Check In:     Session Check In - 03/11/17 1545      Check-In   Location [facility]   Staff Present Diane Angelina Pih, MS, EP, [facility], Exercise Physiologist;Larkin Alfred Wynetta Emery, RN, BSN   Supervising physician immediately available to respond to emergencies See telemetry face sheet for immediately available MD   Medication changes reported     No   Fall or balance concerns reported    No   Warm-up and Cool-down Performed as group-led instruction   Resistance Training Performed Yes   VAD Patient? No     Pain Assessment   Currently in Pain? No/denies   Pain Score 0-No pain   Multiple Pain Sites No      Capillary Blood Glucose: No results found for this or any previous visit (from the past 24 hour(s)).    History  Smoking Status  . Former Smoker  . Packs/day: 1.00  . Years: 23.00  . Types: Cigarettes  . Start date: 10/30/1970  . Quit date: 10/29/1993  Smokeless Tobacco  . Former Systems developer  . Types: Snuff, Chew    Goals Met:  Independence with exercise equipment Exercise tolerated well No report of cardiac concerns or symptoms Strength training completed today  Goals Unmet:  Not Applicable  Comments: Check out 1645.   Dr. Kate Sable is Medical Director for [facility] Cardiac and Pulmonary Rehab.

## 2017-03-13 ENCOUNTER — Encounter (HOSPITAL_COMMUNITY)
Admission: RE | Admit: 2017-03-13 | Discharge: 2017-03-13 | Disposition: A | Discharge status: Home / Self Care | Payer: BC Managed Care – PPO | Source: Ambulatory Visit | Attending: Cardiology | Admitting: Cardiology

## 2017-03-13 DIAGNOSIS — Z951 Presence of aortocoronary bypass graft: Secondary | ICD-10-CM

## 2017-03-13 NOTE — Progress Notes (Signed)
Daily Session Note  Patient Details  Name: Kelly [name] MRN: [id number] Date of Birth: 06-Jun-1955 Referring Provider:     CARDIAC REHAB PHASE II ORIENTATION from 03/06/2017 in [place]  Referring Provider  Dr. Domenic Polite      Encounter Date: 03/13/2017  Check In:     Session Check In - 03/13/17 0818      Check-In   Location [facility]   Staff Present Aundra Dubin, RN, BSN;Kiyaan Haq Luther Parody, BS, EP, Exercise Physiologist   Supervising physician immediately available to respond to emergencies See telemetry face sheet for immediately available MD   Medication changes reported     No   Fall or balance concerns reported    No   Warm-up and Cool-down Performed as group-led instruction   Resistance Training Performed Yes   VAD Patient? No     Pain Assessment   Currently in Pain? No/denies   Pain Score 0-No pain   Multiple Pain Sites No      Capillary Blood Glucose: No results found for this or any previous visit (from the past 24 hour(s)).      Exercise Prescription Changes - 03/12/17 1500      Response to Exercise   Blood Pressure (Admit) 140/60   Blood Pressure (Exercise) 144/70   Blood Pressure (Exit) 144/70   Heart Rate (Admit) 52 bpm   Heart Rate (Exercise) 98 bpm   Heart Rate (Exit) 76 bpm   Rating of Perceived Exertion (Exercise) 10   Duration Progress to 30 minutes of  aerobic without signs/symptoms of physical distress   Intensity THRR New  [phone]     Progression   Progression Continue to progress workloads to maintain intensity without signs/symptoms of physical distress.     Resistance Training   Training Prescription Yes   Weight 1   Reps 10-15     Recumbant Elliptical   Level 1   RPM 59   Watts 74   Minutes 15   METs 3.9     T5 Nustep   Level 2   SPM 93   Minutes 20   METs 2.4     Home Exercise Plan   Plans to continue exercise at Home (comment)   Frequency Add 2 additional days to program  exercise sessions.   Initial Home Exercises Provided 03/09/17      History  Smoking Status  . Former Smoker  . Packs/day: 1.00  . Years: 23.00  . Types: Cigarettes  . Start date: 10/30/1970  . Quit date: 10/29/1993  Smokeless Tobacco  . Former Systems developer  . Types: Snuff, Chew    Goals Met:  Independence with exercise equipment Exercise tolerated well No report of cardiac concerns or symptoms Strength training completed today  Goals Unmet:  Not Applicable  Comments: Check out 915   Dr. Kate Sable is Medical Director for [facility] and [facility]monary Re[facility].

## 2017-03-16 ENCOUNTER — Encounter (HOSPITAL_COMMUNITY)
Admission: RE | Admit: 2017-03-16 | Discharge: 2017-03-16 | Disposition: A | Discharge status: Home / Self Care | Payer: BC Managed Care – PPO | Source: Ambulatory Visit | Attending: Cardiology | Admitting: Cardiology

## 2017-03-16 ENCOUNTER — Encounter: Payer: Self-pay | Admitting: Cardiology

## 2017-03-16 DIAGNOSIS — Z951 Presence of aortocoronary bypass graft: Secondary | ICD-10-CM | POA: Diagnosis not present

## 2017-03-16 NOTE — Progress Notes (Signed)
Cardiac Individual Treatment Plan  Patient Details  Name: Kelly [name] MRN: [id number] Date of Birth: 02/13/1956 Referring Provider:     CARDIAC REHAB PHASE II ORIENTATION from 03/06/2017 in [place]  Referring Provider  Dr. Domenic Polite      Initial Encounter Date:    CARDIAC REHAB PHASE II ORIENTATION from 03/06/2017 in [place]  Date  03/06/17  Referring Provider  Dr. Domenic Polite      Visit Diagnosis: S/P CABG x 3  Patient's Home Medications on Admission:  Current Outpatient Prescriptions:  .  acetaminophen (TYLENOL) 325 MG tablet, Take 650 mg by mouth every 6 (six) hours as needed for mild pain., Disp: , Rfl:  .  aspirin EC 81 MG tablet, Take 81 mg by mouth daily., Disp: , Rfl:  .  Continuous Blood Gluc Sensor ([place]) MISC, Use one sensor every 10 days., Disp: 3 each, Rfl: 2 .  FLUoxetine (PROZAC) 20 MG capsule, Take 20 mg by mouth daily., Disp: , Rfl:  .  gabapentin (NEURONTIN) 300 MG capsule, Take 300 mg by mouth at bedtime., Disp: , Rfl:  .  insulin aspart (NOVOLOG FLEXPEN) 100 UNIT/ML FlexPen, Inject 18-24 Units into the skin 3 (three) times daily with meals., Disp: 30 mL, Rfl: 2 .  Insulin Glargine (BASAGLAR KWIKPEN) 100 UNIT/ML SOPN, INJECT 80 UNITS INTO THE SKIN DAILY AT 10 PM, Disp: 30 mL, Rfl: 3 .  metFORMIN (GLUCOPHAGE) 500 MG tablet, Take 1 tablet (500 mg total) by mouth 2 (two) times daily with a meal., Disp: 60 tablet, Rfl: 3 .  metoprolol (LOPRESSOR) 50 MG tablet, Take 25 mg by mouth 2 (two) times daily.  , Disp: , Rfl:  .  nitroGLYCERIN (NITROSTAT) 0.4 MG SL tablet, Place 1 tablet (0.4 mg total) under the tongue every 5 (five) minutes as needed., Disp: 25 tablet, Rfl: 3 .  ONETOUCH VERIO test strip, USE FOUR TIMES DAILY AS DIRECTED, Disp: 450 each, Rfl: 0 .  oxyCODONE (ROXICODONE) 15 MG immediate release tablet, Take 1 tablet (15 mg total) by mouth every 4 (four) hours as needed for pain., Disp:  30 tablet, Rfl: 0 .  pantoprazole (PROTONIX) 40 MG tablet, TAKE 1 TABLET BY MOUTH BEFORE BREAKFAST, Disp: 30 tablet, Rfl: 11 .  simvastatin (ZOCOR) 10 MG tablet, TAKE 1 TABLET BY MOUTH EVERY NIGHT AT BEDTIME, Disp: 30 tablet, Rfl: 2 .  tamsulosin (FLOMAX) 0.4 MG CAPS capsule, Take 1 capsule by mouth every evening., Disp: , Rfl: 12 .  VICTOZA 18 MG/3ML SOPN, ADMINISTER 1.8 MG UNDER THE SKIN EVERY DAY, Disp: 9 mL, Rfl: 3  Past Medical History: Past Medical History:  Diagnosis Date  . Anxiety   . Arthritis   . Coronary atherosclerosis of native coronary artery    BMS LAD and PTCA RCA 2000,BMS RCA/PLA 2001  . Diabetes mellitus   . Diverticulitis   . Fatty liver   . Gastric ulcer   . GERD (gastroesophageal reflux disease)   . Heart murmur    as a teenager  . HTN (hypertension)   . Hyperlipidemia   . Low-grade NHL (non-Hodgkin's lymphoma) 2007  . Lupus anticoagulant positive   . MI (myocardial infarction) ([place]) 2001  . Neuropathy    peripheral  . Type 2 diabetes mellitus ([place])   . Umbilical hernia     Tobacco Use: History  Smoking Status  . Former Smoker  . Packs/day: 1.00  . Years: 23.00  . Types: Cigarettes  . Start date:  10/30/1970  . Quit date: 10/29/1993  Smokeless Tobacco  . Former Systems developer  . Types: Snuff, Chew    Labs: Recent Review Flowsheet Data    Labs for ITP Cardiac and Pulmonary Rehab Latest Ref Rng & Units 11/17/2016 11/17/2016 11/17/2016 11/18/2016 03/02/2017   Cholestrol <200 mg/dL - - - - 107   LDLCALC <130 mg/dL - - - - -   HDL >40 mg/dL - - - - 39(L)   Trlycerides <150 mg/dL - - - - 164(H)   Hemoglobin A1c <5.7 % of total Hgb - - - - 8.3(H)   PHART 7.350 - 7.450 - - - - -   PCO2ART 32.0 - 48.0 mmHg - - - - -   HCO3 20.0 - 28.0 mmol/L - - - - -   TCO2 0 - 100 mmol/L - 28 35 - -   ACIDBASEDEF 0.0 - 2.0 mmol/L - - - - -   O2SAT % 59.0 - - 64.3 -      Capillary Blood Glucose: Lab Results  Component Value Date   GLUCAP 188 (H) 11/21/2016   GLUCAP 211 (H)  11/21/2016   GLUCAP 99 11/20/2016   GLUCAP 175 (H) 11/20/2016   GLUCAP 251 (H) 11/20/2016     Exercise Target Goals:    Exercise Program Goal: Individual exercise prescription set with THRR, safety & activity barriers. Participant demonstrates ability to understand and report RPE using BORG scale, to self-measure pulse accurately, and to acknowledge the importance of the exercise prescription.  Exercise Prescription Goal: Starting with aerobic activity 30 plus minutes a day, 3 days per week for initial exercise prescription. Provide home exercise prescription and guidelines that participant acknowledges understanding prior to discharge.  Activity Barriers & Risk Stratification:     Activity Barriers & Cardiac Risk Stratification - 03/06/17 1340      Activity Barriers & Cardiac Risk Stratification   Activity Barriers Other (comment)  may experience (L) knee pain   Cardiac Risk Stratification High      6 Minute Walk:     6 Minute Walk    Row Name 03/06/17 1336         6 Minute Walk   Phase Initial     Distance 1050 feet     Walk Time 6 minutes     # of Rest Breaks 1     MPH 1.98     METS 2.52     RPE 12     Perceived Dyspnea  11     VO2 Peak 7.61     Symptoms Yes (comment)     Comments Left knee pain 3/10. SOB near end of test. Knee pain 0/10 after rest     Resting HR 81 bpm     Resting BP 136/76     Resting Oxygen Saturation  97 %     Exercise Oxygen Saturation  during 6 min walk 93 %     Max Ex. HR 93 bpm     Max Ex. BP 146/74     2 Minute Post BP 134/68        Oxygen Initial Assessment:   Oxygen Re-Evaluation:   Oxygen Discharge (Final Oxygen Re-Evaluation):   Initial Exercise Prescription:     Initial Exercise Prescription - 03/06/17 1400      Date of Initial Exercise RX and Referring Provider   Date 03/06/17   Referring Provider Dr. Domenic Polite     Recumbant Elliptical   Level 1   RPM 34  Watts 30   Minutes 15   METs 1.6     T5  Nustep   Level 2   SPM 53   Minutes 20   METs 1.9     Prescription Details   Frequency (times per week) 3   Duration Progress to 30 minutes of continuous aerobic without signs/symptoms of physical distress     Intensity   THRR 40-80% of Max Heartrate [phone]   Ratings of Perceived Exertion 11-13   Perceived Dyspnea 0-4     Progression   Progression Continue progressive overload as per policy without signs/symptoms or physical distress.     Resistance Training   Training Prescription Yes   Weight 1   Reps 10-15      Perform Capillary Blood Glucose checks as needed.  Exercise Prescription Changes:      Exercise Prescription Changes    Row Name 03/10/17 0700 03/12/17 1500           Response to Exercise   Blood Pressure (Admit)  - 140/60      Blood Pressure (Exercise)  - 144/70      Blood Pressure (Exit)  - 144/70      Heart Rate (Admit)  - 52 bpm      Heart Rate (Exercise)  - 98 bpm      Heart Rate (Exit)  - 76 bpm      Rating of Perceived Exertion (Exercise)  - 10      Duration  - Progress to 30 minutes of  aerobic without signs/symptoms of physical distress      Intensity  - THRR New  96-110-129        Progression   Progression  - Continue to progress workloads to maintain intensity without signs/symptoms of physical distress.        Resistance Training   Training Prescription Yes Yes      Weight 1 1      Reps 10-15 10-15        Recumbant Elliptical   Level 1 1      RPM 34 59      Watts 30 74      Minutes 15 15      METs 1.6 3.9        T5 Nustep   Level 2 2      SPM 53 93      Minutes 20 20      METs 1.9 2.4        Home Exercise Plan   Plans to continue exercise at Home (comment) Home (comment)      Frequency Add 2 additional days to program exercise sessions. Add 2 additional days to program exercise sessions.      Initial Home Exercises Provided 03/09/17 03/09/17         Exercise Comments:      Exercise Comments    Row Name  03/10/17 0751 03/12/17 1533         Exercise Comments Patient received the take home exercise plan today 03/09/2017. THR and safe ways to be active outside of CR were addressed. Patient demonstrated an understanding. Patient was encouraged to ask any future questions.  Patient is doing well in CR. He has already progressed in [facility] on both machines. It is still his first week and his levels will increase in time.         Exercise Goals and Review:      Exercise Goals    Row Name 03/06/17 1341  Exercise Goals   Increase Physical Activity Yes       Intervention Provide advice, education, support and counseling about physical activity/exercise needs.;Develop an individualized exercise prescription for aerobic and resistive training based on initial evaluation findings, risk stratification, comorbidities and participant's personal goals.       Expected Outcomes Achievement of increased cardiorespiratory fitness and enhanced flexibility, muscular endurance and strength shown through measurements of functional capacity and personal statement of participant.       Increase Strength and Stamina Yes       Intervention Provide advice, education, support and counseling about physical activity/exercise needs.;Develop an individualized exercise prescription for aerobic and resistive training based on initial evaluation findings, risk stratification, comorbidities and participant's personal goals.       Expected Outcomes Achievement of increased cardiorespiratory fitness and enhanced flexibility, muscular endurance and strength shown through measurements of functional capacity and personal statement of participant.       Able to understand and use rate of perceived exertion (RPE) scale Yes       Intervention Provide education and explanation on how to use RPE scale       Expected Outcomes Short Term: Able to use RPE daily in rehab to express subjective intensity level;Long Term:  Able to use  RPE to guide intensity level when exercising independently       Able to understand and use Dyspnea scale Yes       Intervention Provide education and explanation on how to use Dyspnea scale       Expected Outcomes Short Term: Able to use Dyspnea scale daily in rehab to express subjective sense of shortness of breath during exertion;Long Term: Able to use Dyspnea scale to guide intensity level when exercising independently       Knowledge and understanding of Target Heart Rate Range (THRR) Yes       Intervention Provide education and explanation of THRR including how the numbers were predicted and where they are located for reference       Expected Outcomes Short Term: Able to state/look up THRR;Long Term: Able to use THRR to govern intensity when exercising independently       Able to check pulse independently Yes       Intervention Provide education and demonstration on how to check pulse in carotid and radial arteries.;Review the importance of being able to check your own pulse for safety during independent exercise       Expected Outcomes Short Term: Able to explain why pulse checking is important during independent exercise;Long Term: Able to check pulse independently and accurately       Understanding of Exercise Prescription Yes       Intervention Provide education, explanation, and written materials on patient's individual exercise prescription       Expected Outcomes Short Term: Able to explain program exercise prescription;Long Term: Able to explain home exercise prescription to exercise independently          Exercise Goals Re-Evaluation :     Exercise Goals Re-Evaluation    Row Name 03/12/17 1532             Exercise Goal Re-Evaluation   Exercise Goals Review Increase Physical Activity;Increase Strength and Stamina;Knowledge and understanding of Target Heart Rate Range (THRR)       Comments Patient is doing well in CR. He has already progressed in [facility] on both machines. It is  still his first week and his levels will increase in time.  Expected Outcomes Patient wants to get stronger and to be active with out SOB           Discharge Exercise Prescription (Final Exercise Prescription Changes):     Exercise Prescription Changes - 03/12/17 1500      Response to Exercise   Blood Pressure (Admit) 140/60   Blood Pressure (Exercise) 144/70   Blood Pressure (Exit) 144/70   Heart Rate (Admit) 52 bpm   Heart Rate (Exercise) 98 bpm   Heart Rate (Exit) 76 bpm   Rating of Perceived Exertion (Exercise) 10   Duration Progress to 30 minutes of  aerobic without signs/symptoms of physical distress   Intensity THRR New  96-110-129     Progression   Progression Continue to progress workloads to maintain intensity without signs/symptoms of physical distress.     Resistance Training   Training Prescription Yes   Weight 1   Reps 10-15     Recumbant Elliptical   Level 1   RPM 59   Watts 74   Minutes 15   METs 3.9     T5 Nustep   Level 2   SPM 93   Minutes 20   METs 2.4     Home Exercise Plan   Plans to continue exercise at Home (comment)   Frequency Add 2 additional days to program exercise sessions.   Initial Home Exercises Provided 03/09/17      Nutrition:  Target Goals: Understanding of nutrition guidelines, daily intake of sodium 1500mg , cholesterol 200mg , calories 30% from fat and 7% or less from saturated fats, daily to have 5 or more servings of fruits and vegetables.  Biometrics:     Pre Biometrics - 03/06/17 1506      Pre Biometrics   Height 6\' 3"  (1.905 m)   Weight (!)  335 lb 8 oz (152.2 kg)   Waist Circumference 50 inches   Hip Circumference 48 inches   Waist to Hip Ratio 1.04 %   BMI (Calculated) 41.93   Triceps Skinfold 20 mm   % Body Fat 37.4 %   Grip Strength 93.3 kg   Flexibility 0 in   Single Leg Stand 6 seconds       Nutrition Therapy Plan and Nutrition Goals:     Nutrition Therapy & Goals - 03/06/17 1507       Personal Nutrition Goals   Personal Goal #2 Patient stated that he eats what he wants. He says he has cut back. He is seeing Dr. Dorris Fetch for his diabetes.    Additional Goals? No      Nutrition Discharge: Rate Your Plate Scores:     Nutrition Assessments - 03/06/17 1508      MEDFICTS Scores   Pre Score 66      Nutrition Goals Re-Evaluation:   Nutrition Goals Discharge (Final Nutrition Goals Re-Evaluation):   Psychosocial: Target Goals: Acknowledge presence or absence of significant depression and/or stress, maximize coping skills, provide positive support system. Participant is able to verbalize types and ability to use techniques and skills needed for reducing stress and depression.  Initial Review & Psychosocial Screening:     Initial Psych Review & Screening - 03/06/17 1509      Initial Review   Current issues with None Identified     Family Dynamics   Good Support System? Yes     Barriers   Psychosocial barriers to participate in program There are no identifiable barriers or psychosocial needs.     Screening Interventions  Interventions Encouraged to exercise      Quality of Life Scores:     Quality of Life - 03/06/17 1510      Quality of Life Scores   Health/Function Pre 22.91 %   Socioeconomic Pre 24.5 %   Psych/Spiritual Pre 23.17 %   Family Pre 21 %   GLOBAL Pre 23.04 %      PHQ-9: Recent Review Flowsheet Data    Depression screen [facility] 2/9 03/06/2017 03/06/2017 12/05/2016 08/22/2016 05/16/2016   Decreased Interest 0 0 0 0 0   Down, Depressed, Hopeless 0 0 0 0 0   PHQ - 2 Score 0 0 0 0 0   Altered sleeping 1 - - - -   Tired, decreased energy 1 - - - -   Change in appetite 1 - - - -   Feeling bad or failure about yourself  0 - - - -   Trouble concentrating 0 - - - -   Moving slowly or fidgety/restless 0 - - - -   Suicidal thoughts 0 - - - -   PHQ-9 Score 3 - - - -   Difficult doing work/chores Somewhat difficult - - - -     Interpretation  of Total Score  Total Score Depression Severity:  1-4 = Minimal depression, 5-9 = Mild depression, 10-14 = Moderate depression, 15-19 = Moderately severe depression, 20-27 = Severe depression   Psychosocial Evaluation and Intervention:     Psychosocial Evaluation - 03/06/17 1509      Psychosocial Evaluation & Interventions   Interventions Encouraged to exercise with the program and follow exercise prescription   Continue Psychosocial Services  No Follow up required      Psychosocial Re-Evaluation:   Psychosocial Discharge (Final Psychosocial Re-Evaluation):   Vocational Rehabilitation: Provide vocational rehab assistance to qualifying candidates.   Vocational Rehab Evaluation & Intervention:     Vocational Rehab - 03/06/17 1454      Initial Vocational Rehab Evaluation & Intervention   Assessment shows need for Vocational Rehabilitation No      Education: Education Goals: Education classes will be provided on a weekly basis, covering required topics. Participant will state understanding/return demonstration of topics presented.  Learning Barriers/Preferences:     Learning Barriers/Preferences - 03/06/17 1454      Learning Barriers/Preferences   Learning Barriers Hearing   Learning Preferences Pictoral;Video;Individual Instruction;Skilled Demonstration      Education Topics: Hypertension, Hypertension Reduction -Define heart disease and high blood pressure. Discus how high blood pressure affects the body and ways to reduce high blood pressure.   Exercise and Your Heart -Discuss why it is important to exercise, the FITT principles of exercise, normal and abnormal responses to exercise, and how to exercise safely.   Angina -Discuss definition of angina, causes of angina, treatment of angina, and how to decrease risk of having angina.   Cardiac Medications -Review what the following cardiac medications are used for, how they affect the body, and side effects  that may occur when taking the medications.  Medications include Aspirin, Beta blockers, calcium channel blockers, ACE Inhibitors, angiotensin receptor blockers, diuretics, digoxin, and antihyperlipidemics.   Congestive Heart Failure -Discuss the definition of CHF, how to live with CHF, the signs and symptoms of CHF, and how keep track of weight and sodium intake.   Heart Disease and Intimacy -Discus the effect sexual activity has on the heart, how changes occur during intimacy as we age, and safety during sexual activity.   Smoking Cessation /  COPD -Discuss different methods to quit smoking, the health benefits of quitting smoking, and the definition of COPD.   Nutrition I: Fats -Discuss the types of cholesterol, what cholesterol does to the heart, and how cholesterol levels can be controlled.   Nutrition II: Labels -Discuss the different components of food labels and how to read food label   Heart Parts and Heart Disease -Discuss the anatomy of the heart, the pathway of blood circulation through the heart, and these are affected by heart disease.   Stress I: Signs and Symptoms -Discuss the causes of stress, how stress may lead to anxiety and depression, and ways to limit stress.   Stress II: Relaxation -Discuss different types of relaxation techniques to limit stress.   Warning Signs of Stroke / TIA -Discuss definition of a stroke, what the signs and symptoms are of a stroke, and how to identify when someone is having stroke.   Knowledge Questionnaire Score:     Knowledge Questionnaire Score - 03/06/17 1454      Knowledge Questionnaire Score   Pre Score 20/24      Core Components/Risk Factors/Patient Goals at Admission:     Personal Goals and Risk Factors at Admission - 03/06/17 1508      Core Components/Risk Factors/Patient Goals on Admission    Weight Management Weight Maintenance   Personal Goal Other Yes   Personal Goal Gain strength and to be able to do  activities without SOB   Intervention Attend program 3 x week and supplement exercise at home 2 x week.    Expected Outcomes Reach personal goals      Core Components/Risk Factors/Patient Goals Review:    Core Components/Risk Factors/Patient Goals at Discharge (Final Review):    ITP Comments:     ITP Comments    Row Name 03/06/17 1457 03/16/17 0745         ITP Comments Patient is a 35 yearl old maile who is coming to Cardiac Rehab due to CABGx3. 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 He is eager to get started.  Patient new to program completing 4 sessions. Will continue to monitor for progress.          Comments: ITP 30 Day REVIEW Patient new to program completing 4 sessions. Will continue to monitor for progress.

## 2017-03-16 NOTE — Progress Notes (Signed)
Cardiology Office Note  Date: 03/17/2017   [name], DOB 1955/06/20, MRN [id number]  PCP: Sinda Du, MD  Primary Cardiologist: Rozann Lesches, MD   Chief Complaint  Patient presents with  . Coronary Artery Disease    History of Present Illness: Kelly [name] is a 61 y.o. male last seen by Ms. West Pugh in July.  He underwent CABG in June including LIMA to the LAD, SVG to OM 2, and SVG to PDA. Postoperative atrial fibrillation was managed with temporary course of amiodarone and. He saw Dr. Prescott Gum in September.  He has been participating in cardiac rehabilitation.  He presents today with his wife for a follow-up visit.  He does not report any angina symptoms, still has dyspnea on exertion.  We discussed his medications.  He states that he forgets to take them sometimes, we reviewed regular compliance.  We also discussed diet and weight loss.  He is still interested in ultimately undergoing bariatric surgery.  Current cardiac regimen includes aspirin, Lopressor, Zocor, and as needed nitroglycerin.  I reviewed his recent lab work.  He continues to follow with endocrinology.  Recent hemoglobin A1c was 8.3.  He does not report any palpitations or lightheadedness.  Past Medical History:  Diagnosis Date  . Anxiety   . Arthritis   . Coronary atherosclerosis of native coronary artery    BMS LAD and PTCA RCA 2000,BMS RCA/PLA 2001, CABG June 2018  . Diverticulitis   . Essential hypertension   . Fatty liver   . Gastric ulcer   . GERD (gastroesophageal reflux disease)   . Hyperlipidemia   . Low-grade NHL (non-Hodgkin's lymphoma) 2007  . Lupus anticoagulant positive   . MI (myocardial infarction) ([place]) 2001  . Peripheral neuropathy   . Type 2 diabetes mellitus ([place])   . Umbilical hernia     Past Surgical History:  Procedure Laterality Date  . Axillary abscess     left incision and drainage left  . BACK SURGERY     multiple  . BIOPSY  08/15/2014   Procedure: BIOPSY;  Surgeon: Danie Binder, MD;  Location: [facility];  Service: Endoscopy;;  Gastric  . BIOPSY  01/19/2015   Procedure: BIOPSY (GASTRIC ULCER);  Surgeon: Danie Binder, MD;  Location: [facility];  Service: Endoscopy;;  . CARDIAC CATHETERIZATION    . cardiac stents     x5 stents  . COLONOSCOPY  June 2008   Dr. Dellis Filbert Medoff: Mild left colonic diverticulosis  . CORONARY ARTERY BYPASS GRAFT N/A 11/11/2016   Procedure: CORONARY ARTERY BYPASS GRAFTING (CABG) x 3, USING LEFT MAMMARY ARTERY AND RIGHT GREATER SAPHENOUS VEIN HARVESTED ENDOSCOPICALLY;  Surgeon: Ivin Poot, MD;  Location: [place];  Service: Open Heart Surgery;  Laterality: N/A;  . Dental extractions    . ESOPHAGOGASTRODUODENOSCOPY  June 2008   Dr. Dellis Filbert Medoff: Gastric ulcer, antral, biopsy with reactive changes associated glandular atrophy, no H pylori. No features of lymphoma. Likely NSAID related  . ESOPHAGOGASTRODUODENOSCOPY  August 2008   Dr. Dellis Filbert Medoff: Complete healing of gastric ulcers.  . ESOPHAGOGASTRODUODENOSCOPY (EGD) WITH PROPOFOL N/A 08/15/2014   SLF: 1. Abnormal pain due to ulcers & gastriitis   . ESOPHAGOGASTRODUODENOSCOPY (EGD) WITH PROPOFOL N/A 01/19/2015   SLF: 1. Persistant ulcer with firm base 2. single gastric polyp found in the gastric antrum. Ulcer base   . EUS N/A 03/21/2015   Thickened gastric wall in pre-pyloric antrum, no obvious intramural mass, fatty pancreas, no obvious pancreatic mass.   Marland Kitchen  Inguinal lymph node biopsy     right inguinal lymph node biopsy  . INTRAVASCULAR PRESSURE WIRE/FFR STUDY N/A 09/26/2016   Procedure: Intravascular Pressure Wire/FFR Study;  Surgeon: Nelva Bush, MD;  Location: [place] [facility];  Service: Cardiovascular;  Laterality: N/A;  . LEFT HEART CATH AND CORONARY ANGIOGRAPHY N/A 09/26/2016   Procedure: Left Heart Cath and Coronary Angiography;  Surgeon: Nelva Bush, MD;  Location: [place] [facility];  Service: Cardiovascular;  Laterality: N/A;  . NECK  SURGERY    . POLYPECTOMY  01/19/2015   Procedure: POLYPECTOMY (GASTRIC);  Surgeon: Danie Binder, MD;  Location: [facility];  Service: Endoscopy;;  . SHOULDER ARTHROSCOPY Left   . TEE WITHOUT CARDIOVERSION N/A 11/11/2016   Procedure: TRANSESOPHAGEAL ECHOCARDIOGRAM (TEE);  Surgeon: Prescott Gum, Collier Salina, MD;  Location: [place];  Service: Open Heart Surgery;  Laterality: N/A;    Current Outpatient Prescriptions  Medication Sig Dispense Refill  . acetaminophen (TYLENOL) 325 MG tablet Take 650 mg by mouth every 6 (six) hours as needed for mild pain.    Marland Kitchen aspirin EC 81 MG tablet Take 81 mg by mouth daily.    . Continuous Blood Gluc Sensor (FREESTYLE LIBRE SENSOR SYSTEM) MISC Use one sensor every 10 days. 3 each 2  . FLUoxetine (PROZAC) 20 MG capsule Take 20 mg by mouth daily.    Marland Kitchen gabapentin (NEURONTIN) 300 MG capsule Take 300 mg by mouth at bedtime.    . insulin aspart (NOVOLOG FLEXPEN) 100 UNIT/ML FlexPen Inject 18-24 Units into the skin 3 (three) times daily with meals. 30 mL 2  . Insulin Glargine (BASAGLAR KWIKPEN) 100 UNIT/ML SOPN INJECT 80 UNITS INTO THE SKIN DAILY AT 10 PM 30 mL 3  . metFORMIN (GLUCOPHAGE) 500 MG tablet Take 1 tablet (500 mg total) by mouth 2 (two) times daily with a meal. 60 tablet 3  . metoprolol (LOPRESSOR) 50 MG tablet Take 25 mg by mouth 2 (two) times daily.      . nitroGLYCERIN (NITROSTAT) 0.4 MG SL tablet Place 1 tablet (0.4 mg total) under the tongue every 5 (five) minutes as needed. 25 tablet 3  . ONETOUCH VERIO test strip USE FOUR TIMES DAILY AS DIRECTED 450 each 0  . oxyCODONE (ROXICODONE) 15 MG immediate release tablet Take 1 tablet (15 mg total) by mouth every 4 (four) hours as needed for pain. 30 tablet 0  . pantoprazole (PROTONIX) 40 MG tablet TAKE 1 TABLET BY MOUTH BEFORE BREAKFAST 30 tablet 11  . simvastatin (ZOCOR) 10 MG tablet TAKE 1 TABLET BY MOUTH EVERY NIGHT AT BEDTIME 30 tablet 2  . tamsulosin (FLOMAX) 0.4 MG CAPS capsule Take 1 capsule by mouth every evening.  12   . VICTOZA 18 MG/3ML SOPN ADMINISTER 1.8 MG UNDER THE SKIN EVERY DAY 9 mL 3   No current facility-administered medications for this visit.    Allergies:  No known allergies   Social History: The patient  reports that he quit smoking about 23 years ago. His smoking use included Cigarettes. He started smoking about 46 years ago. He has a 23.00 pack-year smoking history. He has quit using smokeless tobacco. His smokeless tobacco use included Snuff and Chew. He reports that he does not drink alcohol or use drugs.   ROS:  Please see the history of present illness. Otherwise, complete review of systems is positive for NYHA class II dyspnea on exertion.  All other systems are reviewed and negative.   Physical Exam: VS:  BP 132/76 (BP Location: Left  Arm)   Pulse 80   Ht 6\' 3"  (1.905 m)   Wt (!) 337 lb (152.9 kg)   SpO2 98%   BMI 42.12 kg/m , BMI Body mass index is 42.12 kg/m.  Wt Readings from Last 3 Encounters:  03/17/17 (!) 337 lb (152.9 kg)  03/06/17 (!) 335 lb 8 oz (152.2 kg)  03/06/17 (!) 339 lb (153.8 kg)    General: Morbidly obese male, appears comfortable at rest. HEENT: Conjunctiva and lids normal, oropharynx clear. Neck: Supple, no elevated JVP or carotid bruits, no thyromegaly. Lungs: Clear to auscultation, nonlabored breathing at rest. Thorax: Well-healed sternal incision. Cardiac: Regular rate and rhythm, no S3, 2/6 systolic murmur, no pericardial rub. Abdomen: Obese, nontender, bowel sounds present. Extremities: No pitting edema, distal pulses 2+. Skin: Warm and dry. Musculoskeletal: No kyphosis. Neuropsychiatric: Alert and oriented x3, affect grossly appropriate.  ECG: I personally reviewed the tracing from 12/15/2016 which showed sinus rhythm with prolonged PR interval, right bundle branch block, and left anterior fascicular block.  Recent Labwork: 11/12/2016: Magnesium 1.6 11/18/2016: ALT 22; AST 28 11/19/2016: Hemoglobin 9.4; Platelets 170 03/02/2017: BUN 15; Creat  1.26; Potassium 4.2; Sodium 136     Component Value Date/Time   CHOL 107 03/02/2017 1615   TRIG 164 (H) 03/02/2017 1615   HDL 39 (L) 03/02/2017 1615   CHOLHDL 2.7 03/02/2017 1615   VLDL 56 (H) 01/28/2016 1602   LDLCALC 41 01/28/2016 1602    Other Studies Reviewed Today:  Intraoperative TEE 11/11/2016:  Left ventricle: Normal cavity size. Concentric hypertrophy of mild severity. LV systolic function is low normal with an EF of 50-55%. There are no obvious wall motion abnormalities. No thrombus present. No mass present.  Left atrium: Lipomatous hypertrophy (normal variant) of the interatrial septum is noted. Patent foramen ovale not present. LAA small and difficult to visualize. No thrombus noted.  Aortic valve: The valve is trileaflet. Mild valve thickening present. Mild valve calcification present. Minimally decreased leaflet separation. Mild stenosis. No regurgitation. Mean gradient 6-7 mmhg.  Mitral valve: No leaflet thickening and calcification present. Mild regurgitation central jet. No MVP noted  Right ventricle: Normal wall thickness and ejection fraction. Dilated.   Post Bypass:  - Normal LV function, EF 60-65% (Dopamine infusion). Cardiac Output 5.2 & 5.0, Cardiac Index 2.1 - 1.9 (post bypass and post chest closure).  - RV function normal.  - Mitral Valve unchanged, mild MR with central jet. - Aorta unchanged, no dissection present.  Cardiac catheterization 09/26/2016: Conclusions: 1. Significant three-vessel coronary artery disease including mild to moderate in-stent restenosis involving the proximal and mid LAD as well as 70% proximal LAD stenosis just beyond the first stent. FFR across these lesions is hemodynamically significant at 0.73. 60% proximal OM2 stenosis is also present, as well as chronic total occlusion of the proximal and mid RCA. 2. Normal left ventricular filling pressure. 3. Normal left ventricular contraction.  Recommendations: 1. Given three-vessel  coronary artery disease, predominantly affecting the LAD and RCA, recommend surgical consultation for CABG. 2. Continue aggressive prevention. 3. Obtain transthoracic echocardiogram.  Assessment and Plan:  1.  Multivessel CAD status post CABG in June with LIMA to LAD, SVG to OM 2, and SVG to PDA.  He is now participating in cardiac rehabilitation.  Reports no angina and chronic dyspnea on exertion.  I reinforced medication compliance, also diet and weight loss.  No changes made in current medications.  2.  Postoperative atrial fibrillation, no recurrences.  Amiodarone and anticoagulation have been discontinued.  3.  Type 2 diabetes mellitus, followed by Dr. Dorris Fetch.  Recent hemoglobin A1c 8.3.  4.  Hyperlipidemia, continues on Zocor.  Recent lab work reviewed.  Current medicines were reviewed with the patient today.  Disposition: Follow-up in 6 months.  Signed, Satira Sark, MD, [facility] ([place]) 03/17/2017 8:58 AM    [facility] [place]. [address], [place], [place] [address] Phone: [phone]; Fax: [phone]

## 2017-03-16 NOTE — Progress Notes (Signed)
Daily Session Note  Patient Details  Name: Kelly [name] MRN: [id number] Date of Birth: 1955-12-02 Referring Provider:     CARDIAC REHAB PHASE II ORIENTATION from 03/06/2017 in [place]  Referring Provider  Dr. Domenic Polite      Encounter Date: 03/16/2017  Check In:     Session Check In - 03/16/17 1545      Check-In   Location [facility]   Staff Present Aundra Dubin, RN, BSN;Diane Coad, MS, EP, [facility], Exercise Physiologist   Supervising physician immediately available to respond to emergencies See telemetry face sheet for immediately available MD   Medication changes reported     No   Fall or balance concerns reported    No   Warm-up and Cool-down Performed as group-led instruction   Resistance Training Performed Yes   VAD Patient? No     Pain Assessment   Currently in Pain? No/denies   Pain Score 0-No pain   Multiple Pain Sites No      Capillary Blood Glucose: No results found for this or any previous visit (from the past 24 hour(s)).    History  Smoking Status  . Former Smoker  . Packs/day: 1.00  . Years: 23.00  . Types: Cigarettes  . Start date: 10/30/1970  . Quit date: 10/29/1993  Smokeless Tobacco  . Former Systems developer  . Types: Snuff, Chew    Goals Met:  Independence with exercise equipment Exercise tolerated well No report of cardiac concerns or symptoms Strength training completed today  Goals Unmet:  Not Applicable  Comments: Check out 1645.   Dr. Kate Sable is Medical Director for [facility] Cardiac and Pulmonary Rehab.

## 2017-03-17 ENCOUNTER — Ambulatory Visit (INDEPENDENT_AMBULATORY_CARE_PROVIDER_SITE_OTHER): Payer: BC Managed Care – PPO | Admitting: Cardiology

## 2017-03-17 ENCOUNTER — Encounter: Payer: Self-pay | Admitting: Cardiology

## 2017-03-17 VITALS — BP 132/76 | HR 80 | Ht 75.0 in | Wt 337.0 lb

## 2017-03-17 DIAGNOSIS — E782 Mixed hyperlipidemia: Secondary | ICD-10-CM

## 2017-03-17 DIAGNOSIS — I9789 Other postprocedural complications and disorders of the circulatory system, not elsewhere classified: Secondary | ICD-10-CM

## 2017-03-17 DIAGNOSIS — I4891 Unspecified atrial fibrillation: Secondary | ICD-10-CM

## 2017-03-17 DIAGNOSIS — E1165 Type 2 diabetes mellitus with hyperglycemia: Secondary | ICD-10-CM

## 2017-03-17 DIAGNOSIS — I25119 Atherosclerotic heart disease of native coronary artery with unspecified angina pectoris: Secondary | ICD-10-CM | POA: Diagnosis not present

## 2017-03-17 NOTE — Patient Instructions (Signed)
Your physician wants you to follow-up in: 6 months with Dr McDowell You will receive a reminder letter in the mail two months in advance. If you don't receive a letter, please call our office to schedule the follow-up appointment.     Your physician recommends that you continue on your current medications as directed. Please refer to the Current Medication list given to you today.    If you need a refill on your cardiac medications before your next appointment, please call your pharmacy.      No tests or lab work ordered today.      Thank you for choosing [facility] !         

## 2017-03-18 ENCOUNTER — Encounter (HOSPITAL_COMMUNITY)
Admission: RE | Admit: 2017-03-18 | Discharge: 2017-03-18 | Disposition: A | Discharge status: Home / Self Care | Payer: BC Managed Care – PPO | Source: Ambulatory Visit | Attending: Cardiology | Admitting: Cardiology

## 2017-03-18 DIAGNOSIS — Z951 Presence of aortocoronary bypass graft: Secondary | ICD-10-CM

## 2017-03-18 NOTE — Progress Notes (Signed)
Daily Session Note  Patient Details  Name: Kelly [name] MRN: [id number] Date of Birth: 08-11-1955 Referring Provider:     CARDIAC REHAB PHASE II ORIENTATION from 03/06/2017 in [place]  Referring Provider  Dr. Domenic Polite      Encounter Date: 03/18/2017  Check In:     Session Check In - 03/18/17 1545      Check-In   Location [facility]   Staff Present Diane Angelina Pih, MS, EP, [facility], Exercise Physiologist;Jamarius Saha Wynetta Emery, RN, BSN   Supervising physician immediately available to respond to emergencies See telemetry face sheet for immediately available MD   Medication changes reported     No   Fall or balance concerns reported    No   Warm-up and Cool-down Performed as group-led instruction   Resistance Training Performed Yes   VAD Patient? No     Pain Assessment   Currently in Pain? No/denies   Pain Score 0-No pain   Multiple Pain Sites No      Capillary Blood Glucose: No results found for this or any previous visit (from the past 24 hour(s)).    History  Smoking Status  . Former Smoker  . Packs/day: 1.00  . Years: 23.00  . Types: Cigarettes  . Start date: 10/30/1970  . Quit date: 10/29/1993  Smokeless Tobacco  . Former Systems developer  . Types: Snuff, Chew    Goals Met:  Independence with exercise equipment Exercise tolerated well No report of cardiac concerns or symptoms Strength training completed today  Goals Unmet:  Not Applicable  Comments: Check out 1645.   Dr. Kate Sable is Medical Director for [facility] Cardiac and Pulmonary Rehab.

## 2017-03-20 ENCOUNTER — Encounter (HOSPITAL_COMMUNITY)
Admission: RE | Admit: 2017-03-20 | Discharge: 2017-03-20 | Disposition: A | Discharge status: Home / Self Care | Payer: BC Managed Care – PPO | Source: Ambulatory Visit | Attending: Cardiology | Admitting: Cardiology

## 2017-03-20 DIAGNOSIS — Z951 Presence of aortocoronary bypass graft: Secondary | ICD-10-CM | POA: Insufficient documentation

## 2017-03-20 NOTE — Progress Notes (Signed)
Daily Session Note  Patient Details  Name: ORANGE HILLIGOSS MRN: [id number] Date of Birth: 04-22-1956 Referring Provider:     CARDIAC REHAB PHASE II ORIENTATION from 03/06/2017 in [place]  Referring Provider  Dr. Domenic Polite      Encounter Date: 03/20/2017  Check In:     Session Check In - 03/20/17 0829      Check-In   Location [facility]   Staff Present Aundra Dubin, RN, BSN;Analiza Cowger Luther Parody, BS, EP, Exercise Physiologist   Supervising physician immediately available to respond to emergencies See telemetry face sheet for immediately available MD   Medication changes reported     No   Fall or balance concerns reported    No   Warm-up and Cool-down Performed as group-led instruction   Resistance Training Performed Yes   VAD Patient? No     Pain Assessment   Currently in Pain? No/denies   Pain Score 0-No pain   Multiple Pain Sites No      Capillary Blood Glucose: No results found for this or any previous visit (from the past 24 hour(s)).    History  Smoking Status  . Former Smoker  . Packs/day: 1.00  . Years: 23.00  . Types: Cigarettes  . Start date: 10/30/1970  . Quit date: 10/29/1993  Smokeless Tobacco  . Former Systems developer  . Types: Snuff, Chew    Goals Met:  Independence with exercise equipment Exercise tolerated well No report of cardiac concerns or symptoms Strength training completed today  Goals Unmet:  Not Applicable  Comments: Check out 915   Dr. Kate Sable is Medical Director for [place] and Pulmonary Rehab.

## 2017-03-23 ENCOUNTER — Encounter (HOSPITAL_COMMUNITY)
Admission: RE | Admit: 2017-03-23 | Discharge: 2017-03-23 | Disposition: A | Discharge status: Home / Self Care | Payer: BC Managed Care – PPO | Source: Ambulatory Visit | Attending: Cardiology | Admitting: Cardiology

## 2017-03-23 DIAGNOSIS — Z951 Presence of aortocoronary bypass graft: Secondary | ICD-10-CM

## 2017-03-23 NOTE — Progress Notes (Signed)
Daily Session Note  Patient Details  Name: AKILI CORSETTI MRN: [id number] Date of Birth: 02/15/1956 Referring Provider:     CARDIAC REHAB PHASE II ORIENTATION from 03/06/2017 in [place]  Referring Provider  Dr. Domenic Polite      Encounter Date: 03/23/2017  Check In: Session Check In - 03/23/17 1545      Check-In   Location  [facility]    Staff Present  Diane Angelina Pih, MS, EP, [facility], Exercise Physiologist;Shakerria Parran Wynetta Emery, RN, BSN    Supervising physician immediately available to respond to emergencies  See telemetry face sheet for immediately available MD    Medication changes reported      No    Fall or balance concerns reported     No    Warm-up and Cool-down  Performed as group-led instruction    Resistance Training Performed  Yes    VAD Patient?  No      Pain Assessment   Currently in Pain?  No/denies    Pain Score  0-No pain    Multiple Pain Sites  No       Capillary Blood Glucose: No results found for this or any previous visit (from the past 24 hour(s)).    Social History   Tobacco Use  Smoking Status Former Smoker  . Packs/day: 1.00  . Years: 23.00  . Pack years: 23.00  . Types: Cigarettes  . Start date: 10/30/1970  . Last attempt to quit: 10/29/1993  . Years since quitting: 23.4  Smokeless Tobacco Former Systems developer  . Types: Snuff, Chew    Goals Met:  Independence with exercise equipment Exercise tolerated well No report of cardiac concerns or symptoms Strength training completed today  Goals Unmet:  Not Applicable  Comments: Check out 1645.   Dr. Kate Sable is Medical Director for [facility] Cardiac and Pulmonary Rehab.

## 2017-03-25 ENCOUNTER — Encounter (HOSPITAL_COMMUNITY)
Admission: RE | Admit: 2017-03-25 | Discharge: 2017-03-25 | Disposition: A | Discharge status: Home / Self Care | Payer: BC Managed Care – PPO | Source: Ambulatory Visit | Attending: Cardiology | Admitting: Cardiology

## 2017-03-25 DIAGNOSIS — Z951 Presence of aortocoronary bypass graft: Secondary | ICD-10-CM | POA: Diagnosis not present

## 2017-03-25 NOTE — Progress Notes (Signed)
Daily Session Note  Patient Details  Name: Kelly [name] MRN: [id number] Date of Birth: 08/07/1955 Referring Provider:     CARDIAC REHAB PHASE II ORIENTATION from 03/06/2017 in [place]  Referring Provider  Dr. Domenic Polite      Encounter Date: 03/25/2017  Check In: Session Check In - 03/25/17 1545      Check-In   Location  [facility]    Staff Present  Diane Angelina Pih, MS, EP, [facility], Exercise Physiologist;Roselee Tayloe Wynetta Emery, RN, BSN    Supervising physician immediately available to respond to emergencies  See telemetry face sheet for immediately available MD    Medication changes reported      No    Fall or balance concerns reported     No    Warm-up and Cool-down  Performed as group-led instruction    Resistance Training Performed  Yes    VAD Patient?  No      Pain Assessment   Currently in Pain?  No/denies    Pain Score  0-No pain    Multiple Pain Sites  No       Capillary Blood Glucose: No results found for this or any previous visit (from the past 24 hour(s)).    Social History   Tobacco Use  Smoking Status Former Smoker  . Packs/day: 1.00  . Years: 23.00  . Pack years: 23.00  . Types: Cigarettes  . Start date: 10/30/1970  . Last attempt to quit: 10/29/1993  . Years since quitting: 23.4  Smokeless Tobacco Former Systems developer  . Types: Snuff, Chew    Goals Met:  Independence with exercise equipment Exercise tolerated well No report of cardiac concerns or symptoms Strength training completed today  Goals Unmet:  Not Applicable  Comments: Check out 1045.   Dr. Kate Sable is Medical Director for [facility] Cardiac and Pulmonary Rehab.

## 2017-03-27 ENCOUNTER — Encounter (HOSPITAL_COMMUNITY)
Admission: RE | Admit: 2017-03-27 | Discharge: 2017-03-27 | Disposition: A | Discharge status: Home / Self Care | Payer: BC Managed Care – PPO | Source: Ambulatory Visit | Attending: Cardiology | Admitting: Cardiology

## 2017-03-27 DIAGNOSIS — Z951 Presence of aortocoronary bypass graft: Secondary | ICD-10-CM

## 2017-03-27 NOTE — Progress Notes (Signed)
Daily Session Note  Patient Details  Name: Kelly [name] MRN: [id number] Date of Birth: [birth year] Referring Provider:     CARDIAC REHAB PHASE II ORIENTATION from 03/06/2017 in [place]  Referring Provider  Dr. Domenic Polite      Encounter Date: 03/27/2017  Check In: Session Check In - 03/27/17 0810      Check-In   Location  [facility]    Staff Present  Suzanne Boron, BS, EP, Exercise Physiologist;Debra Wynetta Emery, RN, BSN    Supervising physician immediately available to respond to emergencies  See telemetry face sheet for immediately available MD    Medication changes reported      No    Fall or balance concerns reported     No    Warm-up and Cool-down  Performed as group-led instruction    Resistance Training Performed  Yes    VAD Patient?  No      Pain Assessment   Currently in Pain?  No/denies    Pain Score  0-No pain    Multiple Pain Sites  No       Capillary Blood Glucose: No results found for this or any previous visit (from the past 24 hour(s)).    Social History   Tobacco Use  Smoking Status Former Smoker  . Packs/day: 1.00  . Years: 23.00  . Pack years: 23.00  . Types: Cigarettes  . Start date: 10/30/1970  . Last attempt to quit: 10/29/1993  . Years since quitting: 23.4  Smokeless Tobacco Former Systems developer  . Types: Snuff, Chew    Goals Met:  Independence with exercise equipment Exercise tolerated well No report of cardiac concerns or symptoms Strength training completed today  Goals Unmet:  Not Applicable  Comments: Check out 915   Dr. Kate Sable is Medical Director for [place] and Pulmonary Rehab.

## 2017-03-30 ENCOUNTER — Encounter (HOSPITAL_COMMUNITY): Payer: BC Managed Care – PPO

## 2017-04-01 ENCOUNTER — Encounter (HOSPITAL_COMMUNITY)
Admission: RE | Admit: 2017-04-01 | Discharge: 2017-04-01 | Disposition: A | Discharge status: Home / Self Care | Payer: BC Managed Care – PPO | Source: Ambulatory Visit | Attending: Cardiology | Admitting: Cardiology

## 2017-04-01 DIAGNOSIS — Z951 Presence of aortocoronary bypass graft: Secondary | ICD-10-CM

## 2017-04-01 NOTE — Progress Notes (Signed)
Daily Session Note  Patient Details  Name: Kelly [name] MRN: [id number] Date of Birth: February 20, 1956 Referring Provider:     CARDIAC REHAB PHASE II ORIENTATION from 03/06/2017 in [place]  Referring Provider  Dr. Domenic Polite      Encounter Date: 04/01/2017  Check In: Session Check In - 04/01/17 1545      Check-In   Location  [facility]    Staff Present  Aundra Dubin, RN, BSN;Diane Coad, MS, EP, [facility], Exercise Physiologist    Supervising physician immediately available to respond to emergencies  See telemetry face sheet for immediately available MD    Medication changes reported      No    Fall or balance concerns reported     No    Resistance Training Performed  No    VAD Patient?  No      Pain Assessment   Currently in Pain?  No/denies    Pain Score  0-No pain    Multiple Pain Sites  No       Capillary Blood Glucose: No results found for this or any previous visit (from the past 24 hour(s)).    Social History   Tobacco Use  Smoking Status Former Smoker  . Packs/day: 1.00  . Years: 23.00  . Pack years: 23.00  . Types: Cigarettes  . Start date: 10/30/1970  . Last attempt to quit: 10/29/1993  . Years since quitting: 23.4  Smokeless Tobacco Former Systems developer  . Types: Snuff, Chew    Goals Met:  Independence with exercise equipment Exercise tolerated well No report of cardiac concerns or symptoms Strength training completed today  Goals Unmet:  Not Applicable  Comments: Check out 1645.   Dr. Kate Sable is Medical Director for [facility] Cardiac and Pulmonary Rehab.

## 2017-04-03 ENCOUNTER — Encounter (HOSPITAL_COMMUNITY)
Admission: RE | Admit: 2017-04-03 | Discharge: 2017-04-03 | Disposition: A | Discharge status: Home / Self Care | Payer: BC Managed Care – PPO | Source: Ambulatory Visit | Attending: Cardiology | Admitting: Cardiology

## 2017-04-03 DIAGNOSIS — Z951 Presence of aortocoronary bypass graft: Secondary | ICD-10-CM

## 2017-04-03 NOTE — Progress Notes (Signed)
Daily Session Note  Patient Details  Name: Kelly [name] MRN: [id number] Date of Birth: 02/13/1956 Referring Provider:     CARDIAC REHAB PHASE II ORIENTATION from 03/06/2017 in [place]  Referring Provider  Dr. Domenic Polite      Encounter Date: 04/03/2017  Check In: Session Check In - 04/03/17 0841      Check-In   Location  [facility]    Staff Present  Suzanne Boron, BS, EP, Exercise Physiologist;Debra Wynetta Emery, RN, BSN    Supervising physician immediately available to respond to emergencies  See telemetry face sheet for immediately available MD    Medication changes reported      No    Fall or balance concerns reported     No    Warm-up and Cool-down  Performed as group-led instruction    Resistance Training Performed  Yes    VAD Patient?  No      Pain Assessment   Currently in Pain?  No/denies    Pain Score  0-No pain    Multiple Pain Sites  No       Capillary Blood Glucose: No results found for this or any previous visit (from the past 24 hour(s)).    Social History   Tobacco Use  Smoking Status Former Smoker  . Packs/day: 1.00  . Years: 23.00  . Pack years: 23.00  . Types: Cigarettes  . Start date: 10/30/1970  . Last attempt to quit: 10/29/1993  . Years since quitting: 23.4  Smokeless Tobacco Former Systems developer  . Types: Snuff, Chew    Goals Met:  Independence with exercise equipment Exercise tolerated well No report of cardiac concerns or symptoms Strength training completed today  Goals Unmet:  Not Applicable  Comments: Check out 915   Dr. Kate Sable is Medical Director for [place] and Pulmonary Re[facility].

## 2017-04-06 ENCOUNTER — Encounter (HOSPITAL_COMMUNITY): Payer: BC Managed Care – PPO

## 2017-04-08 ENCOUNTER — Encounter (HOSPITAL_COMMUNITY): Payer: BC Managed Care – PPO

## 2017-04-10 ENCOUNTER — Encounter (HOSPITAL_COMMUNITY): Payer: BC Managed Care – PPO

## 2017-04-13 ENCOUNTER — Encounter (HOSPITAL_COMMUNITY)
Admission: RE | Admit: 2017-04-13 | Discharge: 2017-04-13 | Disposition: A | Discharge status: Home / Self Care | Payer: BC Managed Care – PPO | Source: Ambulatory Visit | Attending: Cardiology | Admitting: Cardiology

## 2017-04-13 DIAGNOSIS — Z951 Presence of aortocoronary bypass graft: Secondary | ICD-10-CM

## 2017-04-13 NOTE — Progress Notes (Signed)
Daily Session Note  Patient Details  Name: Kelly [name] MRN: [id number] Date of Birth: [birth year] Referring Provider:     CARDIAC REHAB PHASE II ORIENTATION from 03/06/2017 in [place]  Referring Provider  Dr. Domenic Polite      Encounter Date: 04/13/2017  Check In: Session Check In - 04/13/17 1545      Check-In   Location  [facility]    Staff Present  Diane Angelina Pih, MS, EP, [facility], Exercise Physiologist;Haven Pylant Wynetta Emery, RN, BSN    Supervising physician immediately available to respond to emergencies  See telemetry face sheet for immediately available MD    Medication changes reported      No    Fall or balance concerns reported     No    Warm-up and Cool-down  Performed as group-led instruction    Resistance Training Performed  Yes    VAD Patient?  No      Pain Assessment   Currently in Pain?  No/denies    Pain Score  0-No pain    Multiple Pain Sites  No       Capillary Blood Glucose: No results found for this or any previous visit (from the past 24 hour(s)).    Social History   Tobacco Use  Smoking Status Former Smoker  . Packs/day: 1.00  . Years: 23.00  . Pack years: 23.00  . Types: Cigarettes  . Start date: 10/30/1970  . Last attempt to quit: 10/29/1993  . Years since quitting: 23.4  Smokeless Tobacco Former Systems developer  . Types: Snuff, Chew    Goals Met:  Independence with exercise equipment Exercise tolerated well No report of cardiac concerns or symptoms Strength training completed today  Goals Unmet:  Not Applicable  Comments: Check out 1645.   Dr. Kate Sable is Medical Director for [facility] and Pulmonary Re[facility].

## 2017-04-15 ENCOUNTER — Encounter (HOSPITAL_COMMUNITY)
Admission: RE | Admit: 2017-04-15 | Discharge: 2017-04-15 | Disposition: A | Discharge status: Home / Self Care | Payer: BC Managed Care – PPO | Source: Ambulatory Visit | Attending: Cardiology | Admitting: Cardiology

## 2017-04-15 DIAGNOSIS — Z951 Presence of aortocoronary bypass graft: Secondary | ICD-10-CM | POA: Diagnosis not present

## 2017-04-15 NOTE — Progress Notes (Signed)
Daily Session Note  Patient Details  Name: Kelly [name] MRN: [id number] Date of Birth: 1956/02/03 Referring Provider:     CARDIAC REHAB PHASE II ORIENTATION from 03/06/2017 in [place]  Referring Provider  Dr. Domenic Polite      Encounter Date: 04/15/2017  Check In: Session Check In - 04/15/17 1600      Check-In   Location  [facility]    Staff Present  Jayton Popelka Angelina Pih, MS, EP, [facility], Exercise Physiologist;Debra Wynetta Emery, RN, BSN    Supervising physician immediately available to respond to emergencies  See telemetry face sheet for immediately available MD    Medication changes reported      No    Fall or balance concerns reported     No    Warm-up and Cool-down  Performed as group-led instruction    Resistance Training Performed  Yes    VAD Patient?  No      Pain Assessment   Currently in Pain?  No/denies    Pain Score  0-No pain    Multiple Pain Sites  No       Capillary Blood Glucose: No results found for this or any previous visit (from the past 24 hour(s)).    Social History   Tobacco Use  Smoking Status Former Smoker  . Packs/day: 1.00  . Years: 23.00  . Pack years: 23.00  . Types: Cigarettes  . Start date: 10/30/1970  . Last attempt to quit: 10/29/1993  . Years since quitting: 23.4  Smokeless Tobacco Former Systems developer  . Types: Snuff, Chew    Goals Met:  Independence with exercise equipment Exercise tolerated well No report of cardiac concerns or symptoms Strength training completed today  Goals Unmet:  Not Applicable  Comments: Check out 1645   Dr. Kate Sable is Medical Director for [place] and Pulmonary Rehab.

## 2017-04-15 NOTE — Progress Notes (Signed)
Cardiac Individual Treatment Plan  Patient Details  Name: Kelly [name] MRN: [id number] Date of Birth: 07/17/1955 Referring Provider:     CARDIAC REHAB PHASE II ORIENTATION from 03/06/2017 in [place]  Referring Provider  Dr. Domenic Polite      Initial Encounter Date:    CARDIAC REHAB PHASE II ORIENTATION from 03/06/2017 in [place]  Date  03/06/17  Referring Provider  Dr. Domenic Polite      Visit Diagnosis: S/P CABG x 3  Patient's Home Medications on Admission:  Current Outpatient Medications:  .  acetaminophen (TYLENOL) 325 MG tablet, Take 650 mg by mouth every 6 (six) hours as needed for mild pain., Disp: , Rfl:  .  aspirin EC 81 MG tablet, Take 81 mg by mouth daily., Disp: , Rfl:  .  Continuous Blood Gluc Sensor ([place]) MISC, Use one sensor every 10 days., Disp: 3 each, Rfl: 2 .  FLUoxetine (PROZAC) 20 MG capsule, Take 20 mg by mouth daily., Disp: , Rfl:  .  gabapentin (NEURONTIN) 300 MG capsule, Take 300 mg by mouth at bedtime., Disp: , Rfl:  .  insulin aspart (NOVOLOG FLEXPEN) 100 UNIT/ML FlexPen, Inject 18-24 Units into the skin 3 (three) times daily with meals., Disp: 30 mL, Rfl: 2 .  Insulin Glargine (BASAGLAR KWIKPEN) 100 UNIT/ML SOPN, INJECT 80 UNITS INTO THE SKIN DAILY AT 10 PM, Disp: 30 mL, Rfl: 3 .  metFORMIN (GLUCOPHAGE) 500 MG tablet, Take 1 tablet (500 mg total) by mouth 2 (two) times daily with a meal., Disp: 60 tablet, Rfl: 3 .  metoprolol (LOPRESSOR) 50 MG tablet, Take 25 mg by mouth 2 (two) times daily.  , Disp: , Rfl:  .  nitroGLYCERIN (NITROSTAT) 0.4 MG SL tablet, Place 1 tablet (0.4 mg total) under the tongue every 5 (five) minutes as needed., Disp: 25 tablet, Rfl: 3 .  ONETOUCH VERIO test strip, USE FOUR TIMES DAILY AS DIRECTED, Disp: 450 each, Rfl: 0 .  oxyCODONE (ROXICODONE) 15 MG immediate release tablet, Take 1 tablet (15 mg total) by mouth every 4 (four) hours as needed for pain., Disp:  30 tablet, Rfl: 0 .  pantoprazole (PROTONIX) 40 MG tablet, TAKE 1 TABLET BY MOUTH BEFORE BREAKFAST, Disp: 30 tablet, Rfl: 11 .  simvastatin (ZOCOR) 10 MG tablet, TAKE 1 TABLET BY MOUTH EVERY NIGHT AT BEDTIME, Disp: 30 tablet, Rfl: 2 .  tamsulosin (FLOMAX) 0.4 MG CAPS capsule, Take 1 capsule by mouth every evening., Disp: , Rfl: 12 .  VICTOZA 18 MG/3ML SOPN, ADMINISTER 1.8 MG UNDER THE SKIN EVERY DAY, Disp: 9 mL, Rfl: 3  Past Medical History: Past Medical History:  Diagnosis Date  . Anxiety   . Arthritis   . Coronary atherosclerosis of native coronary artery    BMS LAD and PTCA RCA 2000,BMS RCA/PLA 2001, CABG June 2018  . Diverticulitis   . Essential hypertension   . Fatty liver   . Gastric ulcer   . GERD (gastroesophageal reflux disease)   . Hyperlipidemia   . Low-grade NHL (non-Hodgkin's lymphoma) 2007  . Lupus anticoagulant positive   . MI (myocardial infarction) ([place]) 2001  . Peripheral neuropathy   . Type 2 diabetes mellitus ([place])   . Umbilical hernia     Tobacco Use: Social History   Tobacco Use  Smoking Status Former Smoker  . Packs/day: 1.00  . Years: 23.00  . Pack years: 23.00  . Types: Cigarettes  . Start date: 10/30/1970  . Last attempt to  quit: 10/29/1993  . Years since quitting: 23.4  Smokeless Tobacco Former Systems developer  . Types: Snuff, Chew    Labs: Recent Review Flowsheet Data    Labs for ITP Cardiac and Pulmonary Rehab Latest Ref Rng & Units 11/17/2016 11/17/2016 11/17/2016 11/18/2016 03/02/2017   Cholestrol <200 mg/dL - - - - 107   LDLCALC <130 mg/dL - - - - -   HDL >40 mg/dL - - - - 39(L)   Trlycerides <150 mg/dL - - - - 164(H)   Hemoglobin A1c <5.7 % of total Hgb - - - - 8.3(H)   PHART 7.350 - 7.450 - - - - -   PCO2ART 32.0 - 48.0 mmHg - - - - -   HCO3 20.0 - 28.0 mmol/L - - - - -   TCO2 0 - 100 mmol/L - 28 35 - -   ACIDBASEDEF 0.0 - 2.0 mmol/L - - - - -   O2SAT % 59.0 - - 64.3 -      Capillary Blood Glucose: Lab Results  Component Value Date   GLUCAP  188 (H) 11/21/2016   GLUCAP 211 (H) 11/21/2016   GLUCAP 99 11/20/2016   GLUCAP 175 (H) 11/20/2016   GLUCAP 251 (H) 11/20/2016     Exercise Target Goals:    Exercise Program Goal: Individual exercise prescription set with THRR, safety & activity barriers. Participant demonstrates ability to understand and report RPE using BORG scale, to self-measure pulse accurately, and to acknowledge the importance of the exercise prescription.  Exercise Prescription Goal: Starting with aerobic activity 30 plus minutes a day, 3 days per week for initial exercise prescription. Provide home exercise prescription and guidelines that participant acknowledges understanding prior to discharge.  Activity Barriers & Risk Stratification: Activity Barriers & Cardiac Risk Stratification - 03/06/17 1340      Activity Barriers & Cardiac Risk Stratification   Activity Barriers  Other (comment) may experience (L) knee pain    Cardiac Risk Stratification  High       6 Minute Walk: 6 Minute Walk    Row Name 03/06/17 1336         6 Minute Walk   Phase  Initial     Distance  1050 feet     Walk Time  6 minutes     # of Rest Breaks  1     MPH  1.98     METS  2.52     RPE  12     Perceived Dyspnea   11     VO2 Peak  7.61     Symptoms  Yes (comment)     Comments  Left knee pain 3/10. SOB near end of test. Knee pain 0/10 after rest     Resting HR  81 bpm     Resting BP  136/76     Resting Oxygen Saturation   97 %     Exercise Oxygen Saturation  during 6 min walk  93 %     Max Ex. HR  93 bpm     Max Ex. BP  146/74     2 Minute Post BP  134/68        Oxygen Initial Assessment:   Oxygen Re-Evaluation:   Oxygen Discharge (Final Oxygen Re-Evaluation):   Initial Exercise Prescription: Initial Exercise Prescription - 03/06/17 1400      Date of Initial Exercise RX and Referring Provider   Date  03/06/17    Referring Provider  Dr. Domenic Polite  Recumbant Elliptical   Level  1    RPM  34     Watts  30    Minutes  15    METs  1.6      T5 Nustep   Level  2    SPM  53    Minutes  20    METs  1.9      Prescription Details   Frequency (times per week)  3    Duration  Progress to 30 minutes of continuous aerobic without signs/symptoms of physical distress      Intensity   THRR 40-80% of Max Heartrate  [phone]    Ratings of Perceived Exertion  11-13    Perceived Dyspnea  0-4      Progression   Progression  Continue progressive overload as per policy without signs/symptoms or physical distress.      Resistance Training   Training Prescription  Yes    Weight  1    Reps  10-15       Perform Capillary Blood Glucose checks as needed.  Exercise Prescription Changes:  Exercise Prescription Changes    Row Name 03/10/17 0700 03/12/17 1500 03/31/17 0800 04/14/17 0800       Response to Exercise   Blood Pressure (Admit)  -  140/60  136/76  130/60    Blood Pressure (Exercise)  -  144/70  138/62  150/68    Blood Pressure (Exit)  -  144/70  132/70  140/66    Heart Rate (Admit)  -  52 bpm  73 bpm  69 bpm    Heart Rate (Exercise)  -  98 bpm  86 bpm  87 bpm    Heart Rate (Exit)  -  76 bpm  81 bpm  89 bpm    Rating of Perceived Exertion (Exercise)  -  10  12  10     Duration  -  Progress to 30 minutes of  aerobic without signs/symptoms of physical distress  Progress to 30 minutes of  aerobic without signs/symptoms of physical distress  Progress to 30 minutes of  aerobic without signs/symptoms of physical distress    Intensity  -  THRR New [phone]  THRR New [phone]  THRR New [phone]      Progression   Progression  -  Continue to progress workloads to maintain intensity without signs/symptoms of physical distress.  Continue to progress workloads to maintain intensity without signs/symptoms of physical distress.  Continue to progress workloads to maintain intensity without signs/symptoms of physical distress.      Resistance Training   Training Prescription  Yes  Yes   Yes  Yes    Weight  1  1  3  3     Reps  10-15  10-15  10-15  10-15      Arm Ergometer   Level  -  -  2.3  2.5    Watts  -  -  33  3.9    Minutes  -  -  20  20    METs  -  -  3.6  3.7      Recumbant Elliptical   Level  1  1  -  -    RPM  34  59  -  -    Watts  30  74  -  -    Minutes  15  15  -  -    METs  1.6  3.9  -  -  T5 Nustep   Level  2  2  3  3     SPM  53  93  109  112    Minutes  20  20  20  20     METs  1.9  2.4  2.9  3.2      Home Exercise Plan   Plans to continue exercise at  Home (comment)  Home (comment)  Home (comment)  Home (comment)    Frequency  Add 2 additional days to program exercise sessions.  Add 2 additional days to program exercise sessions.  Add 2 additional days to program exercise sessions.  Add 2 additional days to program exercise sessions.    Initial Home Exercises Provided  03/09/17  03/09/17  03/09/17  03/09/17       Exercise Comments:  Exercise Comments    Row Name 03/10/17 0751 03/12/17 1533 03/31/17 0806 04/14/17 0811     Exercise Comments  Patient received the take home exercise plan today 03/09/2017. THR and safe ways to be active outside of CR were addressed. Patient demonstrated an understanding. Patient was encouraged to ask any future questions.   Patient is doing well in CR. He has already progressed in [facility] on both machines. It is still his first week and his levels will increase in time.  Patient was moved to the arm ergometer from the recumbent elliptical due to being uncomfortable on the machine.   Patient is doing well in CR. he has increased his level on the arm crank and has also increased his watts on the nustep. He remains active outisde of CR and has been very determined.        Exercise Goals and Review:  Exercise Goals    Row Name 03/06/17 1341             Exercise Goals   Increase Physical Activity  Yes       Intervention  Provide advice, education, support and counseling about physical activity/exercise  needs.;Develop an individualized exercise prescription for aerobic and resistive training based on initial evaluation findings, risk stratification, comorbidities and participant's personal goals.       Expected Outcomes  Achievement of increased cardiorespiratory fitness and enhanced flexibility, muscular endurance and strength shown through measurements of functional capacity and personal statement of participant.       Increase Strength and Stamina  Yes       Intervention  Provide advice, education, support and counseling about physical activity/exercise needs.;Develop an individualized exercise prescription for aerobic and resistive training based on initial evaluation findings, risk stratification, comorbidities and participant's personal goals.       Expected Outcomes  Achievement of increased cardiorespiratory fitness and enhanced flexibility, muscular endurance and strength shown through measurements of functional capacity and personal statement of participant.       Able to understand and use rate of perceived exertion (RPE) scale  Yes       Intervention  Provide education and explanation on how to use RPE scale       Expected Outcomes  Short Term: Able to use RPE daily in rehab to express subjective intensity level;Long Term:  Able to use RPE to guide intensity level when exercising independently       Able to understand and use Dyspnea scale  Yes       Intervention  Provide education and explanation on how to use Dyspnea scale       Expected Outcomes  Short Term: Able to use Dyspnea scale  daily in rehab to express subjective sense of shortness of breath during exertion;Long Term: Able to use Dyspnea scale to guide intensity level when exercising independently       Knowledge and understanding of Target Heart Rate Range (THRR)  Yes       Intervention  Provide education and explanation of THRR including how the numbers were predicted and where they are located for reference       Expected  Outcomes  Short Term: Able to state/look up THRR;Long Term: Able to use THRR to govern intensity when exercising independently       Able to check pulse independently  Yes       Intervention  Provide education and demonstration on how to check pulse in carotid and radial arteries.;Review the importance of being able to check your own pulse for safety during independent exercise       Expected Outcomes  Short Term: Able to explain why pulse checking is important during independent exercise;Long Term: Able to check pulse independently and accurately       Understanding of Exercise Prescription  Yes       Intervention  Provide education, explanation, and written materials on patient's individual exercise prescription       Expected Outcomes  Short Term: Able to explain program exercise prescription;Long Term: Able to explain home exercise prescription to exercise independently          Exercise Goals Re-Evaluation : Exercise Goals Re-Evaluation    Row Name 03/12/17 1532 04/14/17 0810           Exercise Goal Re-Evaluation   Exercise Goals Review  Increase Physical Activity;Increase Strength and Stamina;Knowledge and understanding of Target Heart Rate Range (THRR)  Increase Physical Activity;Increase Strength and Stamina;Knowledge and understanding of Target Heart Rate Range (THRR)      Comments  Patient is doing well in CR. He has already progressed in [facility] on both machines. It is still his first week and his levels will increase in time.   Patient is doing well in CR. he has increased his level on the arm crank and has also increased his watts on the nustep. He remains active outisde of CR and has been very determined.       Expected Outcomes  Patient wants to get stronger and to be active with out SOB  Patient wishes to get stronger and to be active without SOB.           Discharge Exercise Prescription (Final Exercise Prescription Changes): Exercise Prescription Changes - 04/14/17 0800        Response to Exercise   Blood Pressure (Admit)  130/60    Blood Pressure (Exercise)  150/68    Blood Pressure (Exit)  140/66    Heart Rate (Admit)  69 bpm    Heart Rate (Exercise)  87 bpm    Heart Rate (Exit)  89 bpm    Rating of Perceived Exertion (Exercise)  10    Duration  Progress to 30 minutes of  aerobic without signs/symptoms of physical distress    Intensity  THRR New [phone]      Progression   Progression  Continue to progress workloads to maintain intensity without signs/symptoms of physical distress.      Resistance Training   Training Prescription  Yes    Weight  3    Reps  10-15      Arm Ergometer   Level  2.5    Watts  3.9  Minutes  20    METs  3.7      T5 Nustep   Level  3    SPM  112    Minutes  20    METs  3.2      Home Exercise Plan   Plans to continue exercise at  Home (comment)    Frequency  Add 2 additional days to program exercise sessions.    Initial Home Exercises Provided  03/09/17       Nutrition:  Target Goals: Understanding of nutrition guidelines, daily intake of sodium 1500mg , cholesterol 200mg , calories 30% from fat and 7% or less from saturated fats, daily to have 5 or more servings of fruits and vegetables.  Biometrics: Pre Biometrics - 03/06/17 1506      Pre Biometrics   Height  6\' 3"  (1.905 m)    Weight  335 lb 8 oz (152.2 kg)  (Abnormal)     Waist Circumference  50 inches    Hip Circumference  48 inches    Waist to Hip Ratio  1.04 %    BMI (Calculated)  41.93    Triceps Skinfold  20 mm    % Body Fat  37.4 %    Grip Strength  93.3 kg    Flexibility  0 in    Single Leg Stand  6 seconds        Nutrition Therapy Plan and Nutrition Goals: Nutrition Therapy & Goals - 03/06/17 1507      Personal Nutrition Goals   Personal Goal #2  Patient stated that he eats what he wants. He says he has cut back. He is seeing Dr. Dorris Fetch for his diabetes.     Additional Goals?  No       Nutrition Discharge: Rate Your Plate  Scores: Nutrition Assessments - 03/06/17 1508      MEDFICTS Scores   Pre Score  66       Nutrition Goals Re-Evaluation:   Nutrition Goals Discharge (Final Nutrition Goals Re-Evaluation):   Psychosocial: Target Goals: Acknowledge presence or absence of significant depression and/or stress, maximize coping skills, provide positive support system. Participant is able to verbalize types and ability to use techniques and skills needed for reducing stress and depression.  Initial Review & Psychosocial Screening: Initial Psych Review & Screening - 03/06/17 1509      Initial Review   Current issues with  None Identified      Family Dynamics   Good Support System?  Yes      Barriers   Psychosocial barriers to participate in program  There are no identifiable barriers or psychosocial needs.      Screening Interventions   Interventions  Encouraged to exercise       Quality of Life Scores: Quality of Life - 03/06/17 1510      Quality of Life Scores   Health/Function Pre  22.91 %    Socioeconomic Pre  24.5 %    Psych/Spiritual Pre  23.17 %    Family Pre  21 %    GLOBAL Pre  23.04 %       PHQ-9: Recent Review Flowsheet Data    Depression screen [facility] 2/9 03/06/2017 03/06/2017 12/05/2016 08/22/2016 05/16/2016   Decreased Interest 0 0 0 0 0   Down, Depressed, Hopeless 0 0 0 0 0   PHQ - 2 Score 0 0 0 0 0   Altered sleeping 1 - - - -   Tired, decreased energy 1 - - - -  Change in appetite 1 - - - -   Feeling bad or failure about yourself  0 - - - -   Trouble concentrating 0 - - - -   Moving slowly or fidgety/restless 0 - - - -   Suicidal thoughts 0 - - - -   PHQ-9 Score 3 - - - -   Difficult doing work/chores Somewhat difficult - - - -     Interpretation of Total Score  Total Score Depression Severity:  1-4 = Minimal depression, 5-9 = Mild depression, 10-14 = Moderate depression, 15-19 = Moderately severe depression, 20-27 = Severe depression   Psychosocial Evaluation and  Intervention: Psychosocial Evaluation - 03/06/17 1509      Psychosocial Evaluation & Interventions   Interventions  Encouraged to exercise with the program and follow exercise prescription    Continue Psychosocial Services   No Follow up required       Psychosocial Re-Evaluation: Psychosocial Re-Evaluation    [place] Name 04/15/17 1259             Psychosocial Re-Evaluation   Current issues with  None Identified       Comments  Patient's initial QOL score was 23.04 and his PHQ-9 was 3.       Expected Outcomes  Patient will have no psychosocial issues identified at discharge.        Interventions  Encouraged to attend Cardiac Rehabilitation for the exercise;Stress management education;Relaxation education       Continue Psychosocial Services   No Follow up required          Psychosocial Discharge (Final Psychosocial Re-Evaluation): Psychosocial Re-Evaluation - 04/15/17 1259      Psychosocial Re-Evaluation   Current issues with  None Identified    Comments  Patient's initial QOL score was 23.04 and his PHQ-9 was 3.    Expected Outcomes  Patient will have no psychosocial issues identified at discharge.     Interventions  Encouraged to attend Cardiac Rehabilitation for the exercise;Stress management education;Relaxation education    Continue Psychosocial Services   No Follow up required       Vocational Rehabilitation: Provide vocational rehab assistance to qualifying candidates.   Vocational Rehab Evaluation & Intervention: Vocational Rehab - 03/06/17 1454      Initial Vocational Rehab Evaluation & Intervention   Assessment shows need for Vocational Rehabilitation  No       Education: Education Goals: Education classes will be provided on a weekly basis, covering required topics. Participant will state understanding/return demonstration of topics presented.  Learning Barriers/Preferences: Learning Barriers/Preferences - 03/06/17 1454      Learning Barriers/Preferences     Learning Barriers  Hearing    Learning Preferences  Pictoral;Video;Individual Instruction;Skilled Demonstration       Education Topics: Hypertension, Hypertension Reduction -Define heart disease and high blood pressure. Discus how high blood pressure affects the body and ways to reduce high blood pressure.   Exercise and Your Heart -Discuss why it is important to exercise, the FITT principles of exercise, normal and abnormal responses to exercise, and how to exercise safely.   CARDIAC REHAB PHASE II EXERCISE from 04/01/2017 in [place]  Date  04/01/17  Educator  DC  Instruction Review Code  2- Demonstrated Understanding      Angina -Discuss definition of angina, causes of angina, treatment of angina, and how to decrease risk of having angina.   Cardiac Medications -Review what the following cardiac medications are used for, how they affect the  body, and side effects that may occur when taking the medications.  Medications include Aspirin, Beta blockers, calcium channel blockers, ACE Inhibitors, angiotensin receptor blockers, diuretics, digoxin, and antihyperlipidemics.   Congestive Heart Failure -Discuss the definition of CHF, how to live with CHF, the signs and symptoms of CHF, and how keep track of weight and sodium intake.   Heart Disease and Intimacy -Discus the effect sexual activity has on the heart, how changes occur during intimacy as we age, and safety during sexual activity.   Smoking Cessation / COPD -Discuss different methods to quit smoking, the health benefits of quitting smoking, and the definition of COPD.   Nutrition I: Fats -Discuss the types of cholesterol, what cholesterol does to the heart, and how cholesterol levels can be controlled.   Nutrition II: Labels -Discuss the different components of food labels and how to read food label   Heart Parts and Heart Disease -Discuss the anatomy of the heart, the pathway of blood  circulation through the heart, and these are affected by heart disease.   Stress I: Signs and Symptoms -Discuss the causes of stress, how stress may lead to anxiety and depression, and ways to limit stress.   Stress II: Relaxation -Discuss different types of relaxation techniques to limit stress.   Warning Signs of Stroke / TIA -Discuss definition of a stroke, what the signs and symptoms are of a stroke, and how to identify when someone is having stroke.   Knowledge Questionnaire Score: Knowledge Questionnaire Score - 03/06/17 1454      Knowledge Questionnaire Score   Pre Score  20/24       Core Components/Risk Factors/Patient Goals at Admission: Personal Goals and Risk Factors at Admission - 03/06/17 1508      Core Components/Risk Factors/Patient Goals on Admission    Weight Management  Weight Maintenance    Personal Goal Other  Yes    Personal Goal  Gain strength and to be able to do activities without SOB    Intervention  Attend program 3 x week and supplement exercise at home 2 x week.     Expected Outcomes  Reach personal goals       Core Components/Risk Factors/Patient Goals Review:  Goals and Risk Factor Review    Row Name 04/15/17 1253             Core Components/Risk Factors/Patient Goals Review   Personal Goals Review  Weight Management/Obesity;Diabetes;Improve shortness of breath with ADL's Get stronger; be active without SOB.       Review  Patient has completed 13 sessions maintaining his weight. He says he is eating smaller portions and does not understand why he is not losing weight. His last A1C was 03/02/17 8.3. He is doing well in the program with progression. He says his stamina has improved. He is now able to walk from the parking lot to his job without getting SOB or fatigue. He is doing more activities. He is able to work on his motorcycle and ride without difficulty. Will continue to monitor.        Expected Outcomes  Patient will continue to attend  the program and complete the program and continue to meet his personal goals.           Core Components/Risk Factors/Patient Goals at Discharge (Final Review):  Goals and Risk Factor Review - 04/15/17 1253      Core Components/Risk Factors/Patient Goals Review   Personal Goals Review  Weight Management/Obesity;Diabetes;Improve shortness of breath with  ADL's Get stronger; be active without SOB.    Review  Patient has completed 13 sessions maintaining his weight. He says he is eating smaller portions and does not understand why he is not losing weight. His last A1C was 03/02/17 8.3. He is doing well in the program with progression. He says his stamina has improved. He is now able to walk from the parking lot to his job without getting SOB or fatigue. He is doing more activities. He is able to work on his motorcycle and ride without difficulty. Will continue to monitor.     Expected Outcomes  Patient will continue to attend the program and complete the program and continue to meet his personal goals.        ITP Comments: ITP Comments    Row Name 03/06/17 1457 03/16/17 0745         ITP Comments  Patient is a 43 yearl old maile who is coming to Cardiac Rehab due to CABGx3. 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 He is eager to get started.   Patient new to program completing 4 sessions. Will continue to monitor for progress.          Comments: ITP 30 Day REVIEW Patient doing well in the program. Will continue to monitor for progress.

## 2017-04-17 ENCOUNTER — Encounter (HOSPITAL_COMMUNITY)
Admission: RE | Admit: 2017-04-17 | Discharge: 2017-04-17 | Disposition: A | Discharge status: Home / Self Care | Payer: BC Managed Care – PPO | Source: Ambulatory Visit | Attending: Cardiology | Admitting: Cardiology

## 2017-04-17 DIAGNOSIS — Z951 Presence of aortocoronary bypass graft: Secondary | ICD-10-CM | POA: Diagnosis not present

## 2017-04-17 NOTE — Progress Notes (Signed)
Daily Session Note  Patient Details  Name: Kelly [name] MRN: [id number] Date of Birth: 09-09-1955 Referring Provider:     CARDIAC REHAB PHASE II ORIENTATION from 03/06/2017 in [place] [place]  Referring Provider  Dr. Domenic Polite      Encounter Date: 04/17/2017  Check In: Session Check In - 04/17/17 0836      Check-In   Location  [facility]    Staff Present  Suzanne Boron, BS, EP, Exercise Physiologist;Debra Wynetta Emery, RN, BSN    Supervising physician immediately available to respond to emergencies  See telemetry face sheet for immediately available MD    Medication changes reported      No    Fall or balance concerns reported     No    Warm-up and Cool-down  Performed as group-led instruction    Resistance Training Performed  Yes    VAD Patient?  No      Pain Assessment   Currently in Pain?  No/denies    Pain Score  0-No pain    Multiple Pain Sites  No       Capillary Blood Glucose: No results found for this or any previous visit (from the past 24 hour(s)).    Social History   Tobacco Use  Smoking Status Former Smoker  . Packs/day: 1.00  . Years: 23.00  . Pack years: 23.00  . Types: Cigarettes  . Start date: 10/30/1970  . Last attempt to quit: 10/29/1993  . Years since quitting: 23.4  Smokeless Tobacco Former Systems developer  . Types: Snuff, Chew    Goals Met:  Independence with exercise equipment Exercise tolerated well No report of cardiac concerns or symptoms Strength training completed today  Goals Unmet:  Not Applicable  Comments: Check out 915   Dr. Kate Sable is Medical Director for [facility] and [facility].

## 2017-04-20 ENCOUNTER — Encounter (HOSPITAL_COMMUNITY)
Admission: RE | Admit: 2017-04-20 | Discharge: 2017-04-20 | Disposition: A | Discharge status: Home / Self Care | Payer: BC Managed Care – PPO | Source: Ambulatory Visit | Attending: Cardiology | Admitting: Cardiology

## 2017-04-20 DIAGNOSIS — Z951 Presence of aortocoronary bypass graft: Secondary | ICD-10-CM | POA: Insufficient documentation

## 2017-04-20 NOTE — Progress Notes (Signed)
Daily Session Note  Patient Details  Name: Kelly [name] MRN: [id number] Date of Birth: 08/16/1955 Referring Provider:     CARDIAC REHAB PHASE II ORIENTATION from 03/06/2017 in [facility]  Referring Provider  Dr. McDowell      Encounter Date: 04/20/2017  Check In: Session Check In - 04/20/17 1545      Check-In   Location  [facility]    Staff Present  Debra Johnson, RN, BSN;Diane Coad, MS, EP, CH[facility], Exercise Physiologist    Supervising physician immediately available to respond to emergencies  See telemetry face sheet for immediately available MD    Medication changes reported      No    Fall or balance concerns reported     No    Warm-up and Cool-down  Performed as group-led instruction    Resistance Training Performed  Yes    VAD Patient?  No      Pain Assessment   Currently in Pain?  No/denies    Pain Score  0-No pain    Multiple Pain Sites  No       Capillary Blood Glucose: No results found for this or any previous visit (from the past 24 hour(s)).    Social History   Tobacco Use  Smoking Status Former Smoker  . Packs/day: 1.00  . Years: 23.00  . Pack years: 23.00  . Types: Cigarettes  . Start date: 10/30/1970  . Last attempt to quit: 10/29/1993  . Years since quitting: 23.4  Smokeless Tobacco Former User  . Types: Snuff, Chew    Goals Met:  Independence with exercise equipment Exercise tolerated well No report of cardiac concerns or symptoms Strength training completed today  Goals Unmet:  Not Applicable  Comments: Check out 1645.   Dr. Suresh Koneswaran is Medical Director for [facility]. 

## 2017-04-22 ENCOUNTER — Encounter (HOSPITAL_COMMUNITY): Payer: BC Managed Care – PPO

## 2017-04-24 ENCOUNTER — Encounter (HOSPITAL_COMMUNITY): Payer: BC Managed Care – PPO

## 2017-04-27 ENCOUNTER — Encounter (HOSPITAL_COMMUNITY): Payer: BC Managed Care – PPO

## 2017-04-29 ENCOUNTER — Encounter (HOSPITAL_COMMUNITY): Payer: BC Managed Care – PPO

## 2017-05-01 ENCOUNTER — Encounter (HOSPITAL_COMMUNITY): Payer: BC Managed Care – PPO

## 2017-05-04 ENCOUNTER — Encounter (HOSPITAL_COMMUNITY): Payer: BC Managed Care – PPO

## 2017-05-06 ENCOUNTER — Encounter (HOSPITAL_COMMUNITY): Payer: BC Managed Care – PPO

## 2017-05-07 NOTE — Progress Notes (Signed)
Cardiac Individual Treatment Plan  Patient Details  Name: Kelly [name] MRN: [id number] Date of Birth: 1955/12/14 Referring Provider:     CARDIAC REHAB PHASE II ORIENTATION from 03/06/2017 in [place]  Referring Provider  Dr. Domenic Polite      Initial Encounter Date:    CARDIAC REHAB PHASE II ORIENTATION from 03/06/2017 in [place]  Date  03/06/17  Referring Provider  Dr. Domenic Polite      Visit Diagnosis: S/P CABG x 3  Patient's Home Medications on Admission:  Current Outpatient Medications:  .  acetaminophen (TYLENOL) 325 MG tablet, Take 650 mg by mouth every 6 (six) hours as needed for mild pain., Disp: , Rfl:  .  aspirin EC 81 MG tablet, Take 81 mg by mouth daily., Disp: , Rfl:  .  Continuous Blood Gluc Sensor ([place]) MISC, Use one sensor every 10 days., Disp: 3 each, Rfl: 2 .  FLUoxetine (PROZAC) 20 MG capsule, Take 20 mg by mouth daily., Disp: , Rfl:  .  gabapentin (NEURONTIN) 300 MG capsule, Take 300 mg by mouth at bedtime., Disp: , Rfl:  .  insulin aspart (NOVOLOG FLEXPEN) 100 UNIT/ML FlexPen, Inject 18-24 Units into the skin 3 (three) times daily with meals., Disp: 30 mL, Rfl: 2 .  Insulin Glargine (BASAGLAR KWIKPEN) 100 UNIT/ML SOPN, INJECT 80 UNITS INTO THE SKIN DAILY AT 10 PM, Disp: 30 mL, Rfl: 3 .  metFORMIN (GLUCOPHAGE) 500 MG tablet, Take 1 tablet (500 mg total) by mouth 2 (two) times daily with a meal., Disp: 60 tablet, Rfl: 3 .  metoprolol (LOPRESSOR) 50 MG tablet, Take 25 mg by mouth 2 (two) times daily.  , Disp: , Rfl:  .  nitroGLYCERIN (NITROSTAT) 0.4 MG SL tablet, Place 1 tablet (0.4 mg total) under the tongue every 5 (five) minutes as needed., Disp: 25 tablet, Rfl: 3 .  ONETOUCH VERIO test strip, USE FOUR TIMES DAILY AS DIRECTED, Disp: 450 each, Rfl: 0 .  oxyCODONE (ROXICODONE) 15 MG immediate release tablet, Take 1 tablet (15 mg total) by mouth every 4 (four) hours as needed for pain., Disp:  30 tablet, Rfl: 0 .  pantoprazole (PROTONIX) 40 MG tablet, TAKE 1 TABLET BY MOUTH BEFORE BREAKFAST, Disp: 30 tablet, Rfl: 11 .  simvastatin (ZOCOR) 10 MG tablet, TAKE 1 TABLET BY MOUTH EVERY NIGHT AT BEDTIME, Disp: 30 tablet, Rfl: 2 .  tamsulosin (FLOMAX) 0.4 MG CAPS capsule, Take 1 capsule by mouth every evening., Disp: , Rfl: 12 .  VICTOZA 18 MG/3ML SOPN, ADMINISTER 1.8 MG UNDER THE SKIN EVERY DAY, Disp: 9 mL, Rfl: 3  Past Medical History: Past Medical History:  Diagnosis Date  . Anxiety   . Arthritis   . Coronary atherosclerosis of native coronary artery    BMS LAD and PTCA RCA 2000,BMS RCA/PLA 2001, CABG June 2018  . Diverticulitis   . Essential hypertension   . Fatty liver   . Gastric ulcer   . GERD (gastroesophageal reflux disease)   . Hyperlipidemia   . Low-grade NHL (non-Hodgkin's lymphoma) 2007  . Lupus anticoagulant positive   . MI (myocardial infarction) ([place]) 2001  . Peripheral neuropathy   . Type 2 diabetes mellitus ([place])   . Umbilical hernia     Tobacco Use: Social History   Tobacco Use  Smoking Status Former Smoker  . Packs/day: 1.00  . Years: 23.00  . Pack years: 23.00  . Types: Cigarettes  . Start date: 10/30/1970  . Last attempt to  quit: 10/29/1993  . Years since quitting: 23.5  Smokeless Tobacco Former Systems developer  . Types: Snuff, Chew    Labs: Recent Review Flowsheet Data    Labs for ITP Cardiac and Pulmonary Rehab Latest Ref Rng & Units 11/17/2016 11/17/2016 11/17/2016 11/18/2016 03/02/2017   Cholestrol <200 mg/dL - - - - 107   LDLCALC <130 mg/dL - - - - -   HDL >40 mg/dL - - - - 39(L)   Trlycerides <150 mg/dL - - - - 164(H)   Hemoglobin A1c <5.7 % of total Hgb - - - - 8.3(H)   PHART 7.350 - 7.450 - - - - -   PCO2ART 32.0 - 48.0 mmHg - - - - -   HCO3 20.0 - 28.0 mmol/L - - - - -   TCO2 0 - 100 mmol/L - 28 35 - -   ACIDBASEDEF 0.0 - 2.0 mmol/L - - - - -   O2SAT % 59.0 - - 64.3 -      Capillary Blood Glucose: Lab Results  Component Value Date   GLUCAP  188 (H) 11/21/2016   GLUCAP 211 (H) 11/21/2016   GLUCAP 99 11/20/2016   GLUCAP 175 (H) 11/20/2016   GLUCAP 251 (H) 11/20/2016     Exercise Target Goals:    Exercise Program Goal: Individual exercise prescription set with THRR, safety & activity barriers. Participant demonstrates ability to understand and report RPE using BORG scale, to self-measure pulse accurately, and to acknowledge the importance of the exercise prescription.  Exercise Prescription Goal: Starting with aerobic activity 30 plus minutes a day, 3 days per week for initial exercise prescription. Provide home exercise prescription and guidelines that participant acknowledges understanding prior to discharge.  Activity Barriers & Risk Stratification: Activity Barriers & Cardiac Risk Stratification - 03/06/17 1340      Activity Barriers & Cardiac Risk Stratification   Activity Barriers  Other (comment) may experience (L) knee pain    Cardiac Risk Stratification  High       6 Minute Walk: 6 Minute Walk    Row Name 03/06/17 1336         6 Minute Walk   Phase  Initial     Distance  1050 feet     Walk Time  6 minutes     # of Rest Breaks  1     MPH  1.98     METS  2.52     RPE  12     Perceived Dyspnea   11     VO2 Peak  7.61     Symptoms  Yes (comment)     Comments  Left knee pain 3/10. SOB near end of test. Knee pain 0/10 after rest     Resting HR  81 bpm     Resting BP  136/76     Resting Oxygen Saturation   97 %     Exercise Oxygen Saturation  during 6 min walk  93 %     Max Ex. HR  93 bpm     Max Ex. BP  146/74     2 Minute Post BP  134/68        Oxygen Initial Assessment:   Oxygen Re-Evaluation:   Oxygen Discharge (Final Oxygen Re-Evaluation):   Initial Exercise Prescription: Initial Exercise Prescription - 03/06/17 1400      Date of Initial Exercise RX and Referring Provider   Date  03/06/17    Referring Provider  Dr. Domenic Polite  Recumbant Elliptical   Level  1    RPM  34     Watts  30    Minutes  15    METs  1.6      T5 Nustep   Level  2    SPM  53    Minutes  20    METs  1.9      Prescription Details   Frequency (times per week)  3    Duration  Progress to 30 minutes of continuous aerobic without signs/symptoms of physical distress      Intensity   THRR 40-80% of Max Heartrate  [phone]    Ratings of Perceived Exertion  11-13    Perceived Dyspnea  0-4      Progression   Progression  Continue progressive overload as per policy without signs/symptoms or physical distress.      Resistance Training   Training Prescription  Yes    Weight  1    Reps  10-15       Perform Capillary Blood Glucose checks as needed.  Exercise Prescription Changes: Exercise Prescription Changes    Row Name 03/10/17 0700 03/12/17 1500 03/31/17 0800 04/14/17 0800       Response to Exercise   Blood Pressure (Admit)  -  140/60  136/76  130/60    Blood Pressure (Exercise)  -  144/70  138/62  150/68    Blood Pressure (Exit)  -  144/70  132/70  140/66    Heart Rate (Admit)  -  52 bpm  73 bpm  69 bpm    Heart Rate (Exercise)  -  98 bpm  86 bpm  87 bpm    Heart Rate (Exit)  -  76 bpm  81 bpm  89 bpm    Rating of Perceived Exertion (Exercise)  -  10  12  10     Duration  -  Progress to 30 minutes of  aerobic without signs/symptoms of physical distress  Progress to 30 minutes of  aerobic without signs/symptoms of physical distress  Progress to 30 minutes of  aerobic without signs/symptoms of physical distress    Intensity  -  THRR New [phone]  THRR New [phone]  THRR New [phone]      Progression   Progression  -  Continue to progress workloads to maintain intensity without signs/symptoms of physical distress.  Continue to progress workloads to maintain intensity without signs/symptoms of physical distress.  Continue to progress workloads to maintain intensity without signs/symptoms of physical distress.      Resistance Training   Training Prescription  Yes  Yes   Yes  Yes    Weight  1  1  3  3     Reps  10-15  10-15  10-15  10-15      Arm Ergometer   Level  -  -  2.3  2.5    Watts  -  -  33  3.9    Minutes  -  -  20  20    METs  -  -  3.6  3.7      Recumbant Elliptical   Level  1  1  -  -    RPM  34  59  -  -    Watts  30  74  -  -    Minutes  15  15  -  -    METs  1.6  3.9  -  -  T5 Nustep   Level  2  2  3  3     SPM  53  93  109  112    Minutes  20  20  20  20     METs  1.9  2.4  2.9  3.2      Home Exercise Plan   Plans to continue exercise at  Home (comment)  Home (comment)  Home (comment)  Home (comment)    Frequency  Add 2 additional days to program exercise sessions.  Add 2 additional days to program exercise sessions.  Add 2 additional days to program exercise sessions.  Add 2 additional days to program exercise sessions.    Initial Home Exercises Provided  03/09/17  03/09/17  03/09/17  03/09/17       Exercise Comments: Exercise Comments    Row Name 03/10/17 0751 03/12/17 1533 03/31/17 0806 04/14/17 0811     Exercise Comments  Patient received the take home exercise plan today 03/09/2017. THR and safe ways to be active outside of CR were addressed. Patient demonstrated an understanding. Patient was encouraged to ask any future questions.   Patient is doing well in CR. He has already progressed in [facility] on both machines. It is still his first week and his levels will increase in time.  Patient was moved to the arm ergometer from the recumbent elliptical due to being uncomfortable on the machine.   Patient is doing well in CR. he has increased his level on the arm crank and has also increased his watts on the nustep. He remains active outisde of CR and has been very determined.        Exercise Goals and Review: Exercise Goals    Row Name 03/06/17 1341             Exercise Goals   Increase Physical Activity  Yes       Intervention  Provide advice, education, support and counseling about physical activity/exercise  needs.;Develop an individualized exercise prescription for aerobic and resistive training based on initial evaluation findings, risk stratification, comorbidities and participant's personal goals.       Expected Outcomes  Achievement of increased cardiorespiratory fitness and enhanced flexibility, muscular endurance and strength shown through measurements of functional capacity and personal statement of participant.       Increase Strength and Stamina  Yes       Intervention  Provide advice, education, support and counseling about physical activity/exercise needs.;Develop an individualized exercise prescription for aerobic and resistive training based on initial evaluation findings, risk stratification, comorbidities and participant's personal goals.       Expected Outcomes  Achievement of increased cardiorespiratory fitness and enhanced flexibility, muscular endurance and strength shown through measurements of functional capacity and personal statement of participant.       Able to understand and use rate of perceived exertion (RPE) scale  Yes       Intervention  Provide education and explanation on how to use RPE scale       Expected Outcomes  Short Term: Able to use RPE daily in rehab to express subjective intensity level;Long Term:  Able to use RPE to guide intensity level when exercising independently       Able to understand and use Dyspnea scale  Yes       Intervention  Provide education and explanation on how to use Dyspnea scale       Expected Outcomes  Short Term: Able to use Dyspnea scale daily in  rehab to express subjective sense of shortness of breath during exertion;Long Term: Able to use Dyspnea scale to guide intensity level when exercising independently       Knowledge and understanding of Target Heart Rate Range (THRR)  Yes       Intervention  Provide education and explanation of THRR including how the numbers were predicted and where they are located for reference       Expected  Outcomes  Short Term: Able to state/look up THRR;Long Term: Able to use THRR to govern intensity when exercising independently       Able to check pulse independently  Yes       Intervention  Provide education and demonstration on how to check pulse in carotid and radial arteries.;Review the importance of being able to check your own pulse for safety during independent exercise       Expected Outcomes  Short Term: Able to explain why pulse checking is important during independent exercise;Long Term: Able to check pulse independently and accurately       Understanding of Exercise Prescription  Yes       Intervention  Provide education, explanation, and written materials on patient's individual exercise prescription       Expected Outcomes  Short Term: Able to explain program exercise prescription;Long Term: Able to explain home exercise prescription to exercise independently          Exercise Goals Re-Evaluation : Exercise Goals Re-Evaluation    Row Name 03/12/17 1532 04/14/17 0810           Exercise Goal Re-Evaluation   Exercise Goals Review  Increase Physical Activity;Increase Strength and Stamina;Knowledge and understanding of Target Heart Rate Range (THRR)  Increase Physical Activity;Increase Strength and Stamina;Knowledge and understanding of Target Heart Rate Range (THRR)      Comments  Patient is doing well in CR. He has already progressed in [facility] on both machines. It is still his first week and his levels will increase in time.   Patient is doing well in CR. he has increased his level on the arm crank and has also increased his watts on the nustep. He remains active outisde of CR and has been very determined.       Expected Outcomes  Patient wants to get stronger and to be active with out SOB  Patient wishes to get stronger and to be active without SOB.           Discharge Exercise Prescription (Final Exercise Prescription Changes): Exercise Prescription Changes - 04/14/17 0800        Response to Exercise   Blood Pressure (Admit)  130/60    Blood Pressure (Exercise)  150/68    Blood Pressure (Exit)  140/66    Heart Rate (Admit)  69 bpm    Heart Rate (Exercise)  87 bpm    Heart Rate (Exit)  89 bpm    Rating of Perceived Exertion (Exercise)  10    Duration  Progress to 30 minutes of  aerobic without signs/symptoms of physical distress    Intensity  THRR New [phone]      Progression   Progression  Continue to progress workloads to maintain intensity without signs/symptoms of physical distress.      Resistance Training   Training Prescription  Yes    Weight  3    Reps  10-15      Arm Ergometer   Level  2.5    Watts  3.9    Minutes  20    METs  3.7      T5 Nustep   Level  3    SPM  112    Minutes  20    METs  3.2      Home Exercise Plan   Plans to continue exercise at  Home (comment)    Frequency  Add 2 additional days to program exercise sessions.    Initial Home Exercises Provided  03/09/17       Nutrition:  Target Goals: Understanding of nutrition guidelines, daily intake of sodium 1500mg , cholesterol 200mg , calories 30% from fat and 7% or less from saturated fats, daily to have 5 or more servings of fruits and vegetables.  Biometrics: Pre Biometrics - 03/06/17 1506      Pre Biometrics   Height  6\' 3"  (1.905 m)    Weight  335 lb 8 oz (152.2 kg)  (Abnormal)     Waist Circumference  50 inches    Hip Circumference  48 inches    Waist to Hip Ratio  1.04 %    BMI (Calculated)  41.93    Triceps Skinfold  20 mm    % Body Fat  37.4 %    Grip Strength  93.3 kg    Flexibility  0 in    Single Leg Stand  6 seconds        Nutrition Therapy Plan and Nutrition Goals: Nutrition Therapy & Goals - 03/06/17 1507      Personal Nutrition Goals   Personal Goal #2  Patient stated that he eats what he wants. He says he has cut back. He is seeing Dr. Dorris Fetch for his diabetes.     Additional Goals?  No       Nutrition Discharge: Rate Your Plate  Scores: Nutrition Assessments - 03/06/17 1508      MEDFICTS Scores   Pre Score  66       Nutrition Goals Re-Evaluation:   Nutrition Goals Discharge (Final Nutrition Goals Re-Evaluation):   Psychosocial: Target Goals: Acknowledge presence or absence of significant depression and/or stress, maximize coping skills, provide positive support system. Participant is able to verbalize types and ability to use techniques and skills needed for reducing stress and depression.  Initial Review & Psychosocial Screening: Initial Psych Review & Screening - 03/06/17 1509      Initial Review   Current issues with  None Identified      Family Dynamics   Good Support System?  Yes      Barriers   Psychosocial barriers to participate in program  There are no identifiable barriers or psychosocial needs.      Screening Interventions   Interventions  Encouraged to exercise       Quality of Life Scores: Quality of Life - 03/06/17 1510      Quality of Life Scores   Health/Function Pre  22.91 %    Socioeconomic Pre  24.5 %    Psych/Spiritual Pre  23.17 %    Family Pre  21 %    GLOBAL Pre  23.04 %       PHQ-9: Recent Review Flowsheet Data    Depression screen [facility] 2/9 03/06/2017 03/06/2017 12/05/2016 08/22/2016 05/16/2016   Decreased Interest 0 0 0 0 0   Down, Depressed, Hopeless 0 0 0 0 0   PHQ - 2 Score 0 0 0 0 0   Altered sleeping 1 - - - -   Tired, decreased energy 1 - - - -  Change in appetite 1 - - - -   Feeling bad or failure about yourself  0 - - - -   Trouble concentrating 0 - - - -   Moving slowly or fidgety/restless 0 - - - -   Suicidal thoughts 0 - - - -   PHQ-9 Score 3 - - - -   Difficult doing work/chores Somewhat difficult - - - -     Interpretation of Total Score  Total Score Depression Severity:  1-4 = Minimal depression, 5-9 = Mild depression, 10-14 = Moderate depression, 15-19 = Moderately severe depression, 20-27 = Severe depression   Psychosocial Evaluation and  Intervention: Psychosocial Evaluation - 03/06/17 1509      Psychosocial Evaluation & Interventions   Interventions  Encouraged to exercise with the program and follow exercise prescription    Continue Psychosocial Services   No Follow up required       Psychosocial Re-Evaluation: Psychosocial Re-Evaluation    [place] Name 04/15/17 1259             Psychosocial Re-Evaluation   Current issues with  None Identified       Comments  Patient's initial QOL score was 23.04 and his PHQ-9 was 3.       Expected Outcomes  Patient will have no psychosocial issues identified at discharge.        Interventions  Encouraged to attend Cardiac Rehabilitation for the exercise;Stress management education;Relaxation education       Continue Psychosocial Services   No Follow up required          Psychosocial Discharge (Final Psychosocial Re-Evaluation): Psychosocial Re-Evaluation - 04/15/17 1259      Psychosocial Re-Evaluation   Current issues with  None Identified    Comments  Patient's initial QOL score was 23.04 and his PHQ-9 was 3.    Expected Outcomes  Patient will have no psychosocial issues identified at discharge.     Interventions  Encouraged to attend Cardiac Rehabilitation for the exercise;Stress management education;Relaxation education    Continue Psychosocial Services   No Follow up required       Vocational Rehabilitation: Provide vocational rehab assistance to qualifying candidates.   Vocational Rehab Evaluation & Intervention: Vocational Rehab - 03/06/17 1454      Initial Vocational Rehab Evaluation & Intervention   Assessment shows need for Vocational Rehabilitation  No       Education: Education Goals: Education classes will be provided on a weekly basis, covering required topics. Participant will state understanding/return demonstration of topics presented.  Learning Barriers/Preferences: Learning Barriers/Preferences - 03/06/17 1454      Learning Barriers/Preferences     Learning Barriers  Hearing    Learning Preferences  Pictoral;Video;Individual Instruction;Skilled Demonstration       Education Topics: Hypertension, Hypertension Reduction -Define heart disease and high blood pressure. Discus how high blood pressure affects the body and ways to reduce high blood pressure.   Exercise and Your Heart -Discuss why it is important to exercise, the FITT principles of exercise, normal and abnormal responses to exercise, and how to exercise safely.   CARDIAC REHAB PHASE II EXERCISE from 04/15/2017 in [place]  Date  04/01/17  Educator  DC  Instruction Review Code  2- Demonstrated Understanding      Angina -Discuss definition of angina, causes of angina, treatment of angina, and how to decrease risk of having angina.   Cardiac Medications -Review what the following cardiac medications are used for, how they affect the  body, and side effects that may occur when taking the medications.  Medications include Aspirin, Beta blockers, calcium channel blockers, ACE Inhibitors, angiotensin receptor blockers, diuretics, digoxin, and antihyperlipidemics.   CARDIAC REHAB PHASE II EXERCISE from 04/15/2017 in [place]  Date  04/15/17  Educator  DC  Instruction Review Code  2- Demonstrated Understanding      Congestive Heart Failure -Discuss the definition of CHF, how to live with CHF, the signs and symptoms of CHF, and how keep track of weight and sodium intake.   Heart Disease and Intimacy -Discus the effect sexual activity has on the heart, how changes occur during intimacy as we age, and safety during sexual activity.   Smoking Cessation / COPD -Discuss different methods to quit smoking, the health benefits of quitting smoking, and the definition of COPD.   Nutrition I: Fats -Discuss the types of cholesterol, what cholesterol does to the heart, and how cholesterol levels can be controlled.   Nutrition  II: Labels -Discuss the different components of food labels and how to read food label   Heart Parts and Heart Disease -Discuss the anatomy of the heart, the pathway of blood circulation through the heart, and these are affected by heart disease.   Stress I: Signs and Symptoms -Discuss the causes of stress, how stress may lead to anxiety and depression, and ways to limit stress.   Stress II: Relaxation -Discuss different types of relaxation techniques to limit stress.   Warning Signs of Stroke / TIA -Discuss definition of a stroke, what the signs and symptoms are of a stroke, and how to identify when someone is having stroke.   Knowledge Questionnaire Score: Knowledge Questionnaire Score - 03/06/17 1454      Knowledge Questionnaire Score   Pre Score  20/24       Core Components/Risk Factors/Patient Goals at Admission: Personal Goals and Risk Factors at Admission - 03/06/17 1508      Core Components/Risk Factors/Patient Goals on Admission    Weight Management  Weight Maintenance    Personal Goal Other  Yes    Personal Goal  Gain strength and to be able to do activities without SOB    Intervention  Attend program 3 x week and supplement exercise at home 2 x week.     Expected Outcomes  Reach personal goals       Core Components/Risk Factors/Patient Goals Review:  Goals and Risk Factor Review    Row Name 04/15/17 1253             Core Components/Risk Factors/Patient Goals Review   Personal Goals Review  Weight Management/Obesity;Diabetes;Improve shortness of breath with ADL's Get stronger; be active without SOB.       Review  Patient has completed 13 sessions maintaining his weight. He says he is eating smaller portions and does not understand why he is not losing weight. His last A1C was 03/02/17 8.3. He is doing well in the program with progression. He says his stamina has improved. He is now able to walk from the parking lot to his job without getting SOB or fatigue.  He is doing more activities. He is able to work on his motorcycle and ride without difficulty. Will continue to monitor.        Expected Outcomes  Patient will continue to attend the program and complete the program and continue to meet his personal goals.           Core Components/Risk Factors/Patient Goals at Discharge (  Final Review):  Goals and Risk Factor Review - 04/15/17 1253      Core Components/Risk Factors/Patient Goals Review   Personal Goals Review  Weight Management/Obesity;Diabetes;Improve shortness of breath with ADL's Get stronger; be active without SOB.    Review  Patient has completed 13 sessions maintaining his weight. He says he is eating smaller portions and does not understand why he is not losing weight. His last A1C was 03/02/17 8.3. He is doing well in the program with progression. He says his stamina has improved. He is now able to walk from the parking lot to his job without getting SOB or fatigue. He is doing more activities. He is able to work on his motorcycle and ride without difficulty. Will continue to monitor.     Expected Outcomes  Patient will continue to attend the program and complete the program and continue to meet his personal goals.        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 He is eager to get started.   Patient new to program completing 4 sessions. Will continue to monitor for progress.   Patient dropped out after completing 16 sessions due to insurance.         Comments: Patient stopped coming to Cardiac Rehab on 04/20/17 after completing 16 sessions due to insurance. Doctor will be informed.

## 2017-05-07 NOTE — Progress Notes (Signed)
Discharge Progress Report  Patient Details  Name: Kelly [name] MRN: [id number] Date of Birth: 1955/08/30 Referring Provider:     CARDIAC REHAB PHASE II ORIENTATION from 03/06/2017 in [place]  Referring Provider  Dr. Domenic Polite       Number of Visits: 16   Reason for Discharge:  Early Exit:  Insurance  Smoking History:  Social History   Tobacco Use  Smoking Status Former Smoker  . Packs/day: 1.00  . Years: 23.00  . Pack years: 23.00  . Types: Cigarettes  . Start date: 10/30/1970  . Last attempt to quit: 10/29/1993  . Years since quitting: 23.5  Smokeless Tobacco Former Systems developer  . Types: Snuff, Chew    Diagnosis:  S/P CABG x 3  ADL UCSD:   Initial Exercise Prescription: Initial Exercise Prescription - 03/06/17 1400      Date of Initial Exercise RX and Referring Provider   Date  03/06/17    Referring Provider  Dr. Domenic Polite      Recumbant Elliptical   Level  1    RPM  34    Watts  30    Minutes  15    METs  1.6      T5 Nustep   Level  2    SPM  53    Minutes  20    METs  1.9      Prescription Details   Frequency (times per week)  3    Duration  Progress to 30 minutes of continuous aerobic without signs/symptoms of physical distress      Intensity   THRR 40-80% of Max Heartrate  [id number]    Ratings of Perceived Exertion  11-13    Perceived Dyspnea  0-4      Progression   Progression  Continue progressive overload as per policy without signs/symptoms or physical distress.      Resistance Training   Training Prescription  Yes    Weight  1    Reps  10-15       Discharge Exercise Prescription (Final Exercise Prescription Changes): Exercise Prescription Changes - 04/14/17 0800      Response to Exercise   Blood Pressure (Admit)  130/60    Blood Pressure (Exercise)  150/68    Blood Pressure (Exit)  140/66    Heart Rate (Admit)  69 bpm    Heart Rate (Exercise)  87 bpm    Heart Rate (Exit)  89 bpm    Rating of  Perceived Exertion (Exercise)  10    Duration  Progress to 30 minutes of  aerobic without signs/symptoms of physical distress    Intensity  THRR New [phone]      Progression   Progression  Continue to progress workloads to maintain intensity without signs/symptoms of physical distress.      Resistance Training   Training Prescription  Yes    Weight  3    Reps  10-15      Arm Ergometer   Level  2.5    Watts  3.9    Minutes  20    METs  3.7      T5 Nustep   Level  3    SPM  112    Minutes  20    METs  3.2      Home Exercise Plan   Plans to continue exercise at  Home (comment)    Frequency  Add 2 additional days to program exercise sessions.    Initial  Home Exercises Provided  03/09/17       Functional Capacity: 6 Minute Walk    Row Name 03/06/17 1336         6 Minute Walk   Phase  Initial     Distance  1050 feet     Walk Time  6 minutes     # of Rest Breaks  1     MPH  1.98     METS  2.52     RPE  12     Perceived Dyspnea   11     VO2 Peak  7.61     Symptoms  Yes (comment)     Comments  Left knee pain 3/10. SOB near end of test. Knee pain 0/10 after rest     Resting HR  81 bpm     Resting BP  136/76     Resting Oxygen Saturation   97 %     Exercise Oxygen Saturation  during 6 min walk  93 %     Max Ex. HR  93 bpm     Max Ex. BP  146/74     2 Minute Post BP  134/68        Psychological, QOL, Others - Outcomes: PHQ 2/9: Depression screen [facility] 2/9 03/06/2017 03/06/2017 12/05/2016 08/22/2016 05/16/2016  Decreased Interest 0 0 0 0 0  Down, Depressed, Hopeless 0 0 0 0 0  PHQ - 2 Score 0 0 0 0 0  Altered sleeping 1 - - - -  Tired, decreased energy 1 - - - -  Change in appetite 1 - - - -  Feeling bad or failure about yourself  0 - - - -  Trouble concentrating 0 - - - -  Moving slowly or fidgety/restless 0 - - - -  Suicidal thoughts 0 - - - -  PHQ-9 Score 3 - - - -  Difficult doing work/chores Somewhat difficult - - - -    Quality of Life: Quality of  Life - 03/06/17 1510      Quality of Life Scores   Health/Function Pre  22.91 %    Socioeconomic Pre  24.5 %    Psych/Spiritual Pre  23.17 %    Family Pre  21 %    GLOBAL Pre  23.04 %       Personal Goals: Goals established at orientation with interventions provided to work toward goal. Personal Goals and Risk Factors at Admission - 03/06/17 1508      Core Components/Risk Factors/Patient Goals on Admission    Weight Management  Weight Maintenance    Personal Goal Other  Yes    Personal Goal  Gain strength and to be able to do activities without SOB    Intervention  Attend program 3 x week and supplement exercise at home 2 x week.     Expected Outcomes  Reach personal goals        Personal Goals Discharge: Goals and Risk Factor Review    Row Name 04/15/17 1253             Core Components/Risk Factors/Patient Goals Review   Personal Goals Review  Weight Management/Obesity;Diabetes;Improve shortness of breath with ADL's Get stronger; be active without SOB.       Review  Patient has completed 13 sessions maintaining his weight. He says he is eating smaller portions and does not understand why he is not losing weight. His last A1C was 03/02/17 8.3. He is doing well  in the program with progression. He says his stamina has improved. He is now able to walk from the parking lot to his job without getting SOB or fatigue. He is doing more activities. He is able to work on his motorcycle and ride without difficulty. Will continue to monitor.        Expected Outcomes  Patient will continue to attend the program and complete the program and continue to meet his personal goals.           Exercise Goals and Review: Exercise Goals    Row Name 03/06/17 1341             Exercise Goals   Increase Physical Activity  Yes       Intervention  Provide advice, education, support and counseling about physical activity/exercise needs.;Develop an individualized exercise prescription for aerobic  and resistive training based on initial evaluation findings, risk stratification, comorbidities and participant's personal goals.       Expected Outcomes  Achievement of increased cardiorespiratory fitness and enhanced flexibility, muscular endurance and strength shown through measurements of functional capacity and personal statement of participant.       Increase Strength and Stamina  Yes       Intervention  Provide advice, education, support and counseling about physical activity/exercise needs.;Develop an individualized exercise prescription for aerobic and resistive training based on initial evaluation findings, risk stratification, comorbidities and participant's personal goals.       Expected Outcomes  Achievement of increased cardiorespiratory fitness and enhanced flexibility, muscular endurance and strength shown through measurements of functional capacity and personal statement of participant.       Able to understand and use rate of perceived exertion (RPE) scale  Yes       Intervention  Provide education and explanation on how to use RPE scale       Expected Outcomes  Short Term: Able to use RPE daily in rehab to express subjective intensity level;Long Term:  Able to use RPE to guide intensity level when exercising independently       Able to understand and use Dyspnea scale  Yes       Intervention  Provide education and explanation on how to use Dyspnea scale       Expected Outcomes  Short Term: Able to use Dyspnea scale daily in rehab to express subjective sense of shortness of breath during exertion;Long Term: Able to use Dyspnea scale to guide intensity level when exercising independently       Knowledge and understanding of Target Heart Rate Range (THRR)  Yes       Intervention  Provide education and explanation of THRR including how the numbers were predicted and where they are located for reference       Expected Outcomes  Short Term: Able to state/look up THRR;Long Term: Able to use  THRR to govern intensity when exercising independently       Able to check pulse independently  Yes       Intervention  Provide education and demonstration on how to check pulse in carotid and radial arteries.;Review the importance of being able to check your own pulse for safety during independent exercise       Expected Outcomes  Short Term: Able to explain why pulse checking is important during independent exercise;Long Term: Able to check pulse independently and accurately       Understanding of Exercise Prescription  Yes       Intervention  Provide education, explanation,  and written materials on patient's individual exercise prescription       Expected Outcomes  Short Term: Able to explain program exercise prescription;Long Term: Able to explain home exercise prescription to exercise independently          Nutrition & Weight - Outcomes: Pre Biometrics - 03/06/17 1506      Pre Biometrics   Height  6\' 3"  (1.905 m)    Weight  335 lb 8 oz (152.2 kg)  (Abnormal)     Waist Circumference  50 inches    Hip Circumference  48 inches    Waist to Hip Ratio  1.04 %    BMI (Calculated)  41.93    Triceps Skinfold  20 mm    % Body Fat  37.4 %    Grip Strength  93.3 kg    Flexibility  0 in    Single Leg Stand  6 seconds        Nutrition: Nutrition Therapy & Goals - 03/06/17 1507      Personal Nutrition Goals   Personal Goal #2  Patient stated that he eats what he wants. He says he has cut back. He is seeing Dr. Dorris Fetch for his diabetes.     Additional Goals?  No       Nutrition Discharge: Nutrition Assessments - 03/06/17 1508      MEDFICTS Scores   Pre Score  66       Education Questionnaire Score: Knowledge Questionnaire Score - 03/06/17 1454      Knowledge Questionnaire Score   Pre Score  20/24

## 2017-05-08 ENCOUNTER — Encounter (HOSPITAL_COMMUNITY): Payer: BC Managed Care – PPO

## 2017-05-11 ENCOUNTER — Encounter (HOSPITAL_COMMUNITY): Payer: BC Managed Care – PPO

## 2017-05-13 ENCOUNTER — Encounter (HOSPITAL_COMMUNITY): Payer: BC Managed Care – PPO

## 2017-05-15 ENCOUNTER — Encounter (HOSPITAL_COMMUNITY): Payer: BC Managed Care – PPO

## 2017-05-18 ENCOUNTER — Encounter (HOSPITAL_COMMUNITY): Payer: BC Managed Care – PPO

## 2017-05-20 ENCOUNTER — Encounter (HOSPITAL_COMMUNITY): Payer: BC Managed Care – PPO

## 2017-05-22 ENCOUNTER — Encounter (HOSPITAL_COMMUNITY): Payer: BC Managed Care – PPO

## 2017-05-25 ENCOUNTER — Encounter (HOSPITAL_COMMUNITY): Payer: BC Managed Care – PPO

## 2017-05-27 ENCOUNTER — Encounter (HOSPITAL_COMMUNITY): Payer: BC Managed Care – PPO

## 2017-05-28 LAB — HEMOGLOBIN A1C
EAG (MMOL/L): 11.4 (calc)
HEMOGLOBIN A1C: 8.8 %{Hb} — AB (ref <5.7)
MEAN PLASMA GLUCOSE: 206 (calc)

## 2017-05-29 ENCOUNTER — Encounter (HOSPITAL_COMMUNITY): Payer: BC Managed Care – PPO

## 2017-06-01 ENCOUNTER — Other Ambulatory Visit: Payer: Self-pay | Admitting: "Endocrinology

## 2017-06-12 ENCOUNTER — Other Ambulatory Visit: Payer: Self-pay | Admitting: "Endocrinology

## 2017-06-12 ENCOUNTER — Ambulatory Visit: Payer: BC Managed Care – PPO | Admitting: "Endocrinology

## 2017-06-12 ENCOUNTER — Encounter: Payer: Self-pay | Admitting: "Endocrinology

## 2017-06-12 VITALS — BP 138/81 | HR 81 | Ht 75.0 in | Wt 341.0 lb

## 2017-06-12 DIAGNOSIS — E782 Mixed hyperlipidemia: Secondary | ICD-10-CM

## 2017-06-12 DIAGNOSIS — I10 Essential (primary) hypertension: Secondary | ICD-10-CM

## 2017-06-12 DIAGNOSIS — E1159 Type 2 diabetes mellitus with other circulatory complications: Secondary | ICD-10-CM | POA: Diagnosis not present

## 2017-06-12 MED ORDER — INSULIN ASPART 100 UNIT/ML FLEXPEN: 22.0000 [IU] | PEN_INJECTOR | Freq: Three times a day (TID) | SUBCUTANEOUS | 2 refills | Status: DC

## 2017-06-12 NOTE — Patient Instructions (Signed)

## 2017-06-12 NOTE — Progress Notes (Signed)
Subjective:    Patient [name], male    DOB: 03/07/1956, PCP Sinda Du, MD   Past Medical History:  Diagnosis Date  . Anxiety   . Arthritis   . Coronary atherosclerosis of native coronary artery    BMS LAD and PTCA RCA 2000,BMS RCA/PLA 2001, CABG June 2018  . Diverticulitis   . Essential hypertension   . Fatty liver   . Gastric ulcer   . GERD (gastroesophageal reflux disease)   . Hyperlipidemia   . Low-grade NHL (non-Hodgkin's lymphoma) 2007  . Lupus anticoagulant positive   . MI (myocardial infarction) ([place]) 2001  . Peripheral neuropathy   . Type 2 diabetes mellitus ([place])   . Umbilical hernia    Past Surgical History:  Procedure Laterality Date  . Axillary abscess     left incision and drainage left  . BACK SURGERY     multiple  . BIOPSY  08/15/2014   Procedure: BIOPSY;  Surgeon: Danie Binder, MD;  Location: [facility];  Service: Endoscopy;;  Gastric  . BIOPSY  01/19/2015   Procedure: BIOPSY (GASTRIC ULCER);  Surgeon: Danie Binder, MD;  Location: [facility];  Service: Endoscopy;;  . CARDIAC CATHETERIZATION    . cardiac stents     x5 stents  . COLONOSCOPY  June 2008   Dr. Dellis Filbert Medoff: Mild left colonic diverticulosis  . CORONARY ARTERY BYPASS GRAFT N/A 11/11/2016   Procedure: CORONARY ARTERY BYPASS GRAFTING (CABG) x 3, USING LEFT MAMMARY ARTERY AND RIGHT GREATER SAPHENOUS VEIN HARVESTED ENDOSCOPICALLY;  Surgeon: Ivin Poot, MD;  Location: [facility];  Service: Open Heart Surgery;  Laterality: N/A;  . Dental extractions    . ESOPHAGOGASTRODUODENOSCOPY  June 2008   Dr. Dellis Filbert Medoff: Gastric ulcer, antral, biopsy with reactive changes associated glandular atrophy, no H pylori. No features of lymphoma. Likely NSAID related  . ESOPHAGOGASTRODUODENOSCOPY  August 2008   Dr. Dellis Filbert Medoff: Complete healing of gastric ulcers.  . ESOPHAGOGASTRODUODENOSCOPY (EGD) WITH PROPOFOL N/A 08/15/2014   SLF: 1. Abnormal pain due to ulcers & gastriitis   .  ESOPHAGOGASTRODUODENOSCOPY (EGD) WITH PROPOFOL N/A 01/19/2015   SLF: 1. Persistant ulcer with firm base 2. single gastric polyp found in the gastric antrum. Ulcer base   . EUS N/A 03/21/2015   Thickened gastric wall in pre-pyloric antrum, no obvious intramural mass, fatty pancreas, no obvious pancreatic mass.   . Inguinal lymph node biopsy     right inguinal lymph node biopsy  . INTRAVASCULAR PRESSURE WIRE/FFR STUDY N/A 09/26/2016   Procedure: Intravascular Pressure Wire/FFR Study;  Surgeon: Nelva Bush, MD;  Location: [facility];  Service: Cardiovascular;  Laterality: N/A;  . LEFT HEART CATH AND CORONARY ANGIOGRAPHY N/A 09/26/2016   Procedure: Left Heart Cath and Coronary Angiography;  Surgeon: Nelva Bush, MD;  Location: [place] [facility];  Service: Cardiovascular;  Laterality: N/A;  . NECK SURGERY    . POLYPECTOMY  01/19/2015   Procedure: POLYPECTOMY (GASTRIC);  Surgeon: Danie Binder, MD;  Location: [facility];  Service: Endoscopy;;  . SHOULDER ARTHROSCOPY Left   . TEE WITHOUT CARDIOVERSION N/A 11/11/2016   Procedure: TRANSESOPHAGEAL ECHOCARDIOGRAM (TEE);  Surgeon: Prescott Gum, Collier Salina, MD;  Location: [facility];  Service: Open Heart Surgery;  Laterality: N/A;   Social History   Socioeconomic History  . Marital status: Married    Spouse name: None  . Number of children: None  . Years of education: None  . Highest education level: None  Social Needs  .  Financial resource strain: None  . Food insecurity - worry: None  . Food insecurity - inability: None  . Transportation needs - medical: None  . Transportation needs - non-medical: None  Occupational History  . None  Tobacco Use  . Smoking status: Former Smoker    Packs/day: 1.00    Years: 23.00    Pack years: 23.00    Types: Cigarettes    Start date: 10/30/1970    Last attempt to quit: 10/29/1993    Years since quitting: 23.6  . Smokeless tobacco: Former Systems developer    Types: Snuff, Chew  Substance and Sexual Activity  . Alcohol  use: No    Alcohol/week: 0.0 oz  . Drug use: No  . Sexual activity: No    Birth control/protection: None  Other Topics Concern  . None  Social History Narrative  . None   Outpatient Encounter Medications as of 06/12/2017  Medication Sig  . acetaminophen (TYLENOL) 325 MG tablet Take 650 mg by mouth every 6 (six) hours as needed for mild pain.  Marland Kitchen aspirin EC 81 MG tablet Take 81 mg by mouth daily.  . Continuous Blood Gluc Sensor (FREESTYLE LIBRE SENSOR SYSTEM) MISC Use one sensor every 10 days.  Marland Kitchen FLUoxetine (PROZAC) 20 MG capsule Take 20 mg by mouth daily.  Marland Kitchen gabapentin (NEURONTIN) 300 MG capsule Take 300 mg by mouth at bedtime.  . insulin aspart (NOVOLOG FLEXPEN) 100 UNIT/ML FlexPen Inject 22-28 Units into the skin 3 (three) times daily with meals.  . Insulin Glargine (BASAGLAR KWIKPEN) 100 UNIT/ML SOPN INJECT 80 UNITS INTO THE SKIN DAILY AT 10 PM  . metFORMIN (GLUCOPHAGE) 500 MG tablet Take 1 tablet (500 mg total) by mouth 2 (two) times daily with a meal.  . metoprolol (LOPRESSOR) 50 MG tablet Take 25 mg by mouth 2 (two) times daily.    . nitroGLYCERIN (NITROSTAT) 0.4 MG SL tablet Place 1 tablet (0.4 mg total) under the tongue every 5 (five) minutes as needed.  Glory Rosebush VERIO test strip USE FOUR TIMES DAILY AS DIRECTED  . oxyCODONE (ROXICODONE) 15 MG immediate release tablet Take 1 tablet (15 mg total) by mouth every 4 (four) hours as needed for pain.  . pantoprazole (PROTONIX) 40 MG tablet TAKE 1 TABLET BY MOUTH BEFORE BREAKFAST  . simvastatin (ZOCOR) 10 MG tablet TAKE 1 TABLET BY MOUTH EVERY NIGHT AT BEDTIME  . tamsulosin (FLOMAX) 0.4 MG CAPS capsule Take 1 capsule by mouth every evening.  Marland Kitchen VICTOZA 18 MG/3ML SOPN ADMINISTER 1.8 MG UNDER THE SKIN EVERY DAY  . [DISCONTINUED] insulin aspart (NOVOLOG FLEXPEN) 100 UNIT/ML FlexPen Inject 18-24 Units into the skin 3 (three) times daily with meals.   No facility-administered encounter medications on file as of 06/12/2017.     ALLERGIES: Allergies  Allergen Reactions  . No Known Allergies    VACCINATION STATUS:  There is no immunization history on file for this patient.  Diabetes  He presents for his follow-up diabetic visit. He has type 2 diabetes mellitus. Onset time: He was diagnosed at approximate age of 9 years. His disease course has been stable. There are no hypoglycemic associated symptoms. Pertinent negatives for hypoglycemia include no confusion, headaches, pallor or seizures. There are no diabetic associated symptoms. Pertinent negatives for diabetes include no chest pain, no fatigue, no polydipsia, no polyphagia, no polyuria and no weakness. Symptoms are stable. Diabetic complications include heart disease and nephropathy. Risk factors for coronary artery disease include diabetes mellitus, dyslipidemia, hypertension, male sex, obesity, sedentary lifestyle  and tobacco exposure. Current diabetic treatment includes intensive insulin program and oral agent (dual therapy). He is compliant with treatment most of the time. His weight is increasing steadily. He is following a generally unhealthy diet. When asked about meal planning, he reported none. Prior visit with dietitian: Declines dietitian referral. His breakfast blood glucose range is generally 180-200 mg/dl. His lunch blood glucose range is generally 180-200 mg/dl. His dinner blood glucose range is generally 180-200 mg/dl. His bedtime blood glucose range is generally 180-200 mg/dl. His overall blood glucose range is 180-200 mg/dl. An ACE inhibitor/angiotensin II receptor blocker is being taken.  Hyperlipidemia  This is a chronic problem. The current episode started more than 1 year ago. Exacerbating diseases include diabetes and obesity. Pertinent negatives include no chest pain, myalgias or shortness of breath. Current antihyperlipidemic treatment includes statins. Risk factors for coronary artery disease include diabetes mellitus, dyslipidemia,  hypertension, male sex and a sedentary lifestyle.  Hypertension  This is a chronic problem. The current episode started more than 1 year ago. The problem is controlled. Pertinent negatives include no chest pain, headaches, neck pain, palpitations or shortness of breath. Past treatments include angiotensin blockers. The current treatment provides moderate improvement. Hypertensive end-organ damage includes CAD/MI.    Review of Systems  Constitutional: Negative for fatigue and unexpected weight change.  HENT: Negative for dental problem, mouth sores and trouble swallowing.   Eyes: Negative for visual disturbance.  Respiratory: Negative for cough, choking, chest tightness, shortness of breath and wheezing.   Cardiovascular: Negative for chest pain, palpitations and leg swelling.  Gastrointestinal: Negative for abdominal distention, abdominal pain, constipation, diarrhea, nausea and vomiting.  Endocrine: Negative for polydipsia, polyphagia and polyuria.  Genitourinary: Negative for dysuria, flank pain, hematuria and urgency.  Musculoskeletal: Negative for back pain, gait problem, myalgias and neck pain.  Skin: Negative for pallor, rash and wound.  Neurological: Negative for seizures, syncope, weakness, numbness and headaches.  Psychiatric/Behavioral: Negative.  Negative for confusion and dysphoric mood.    Objective:    BP 138/81   Pulse 81   Ht 6\' 3"  (1.905 m)   Wt (!) 341 lb (154.7 kg)   BMI 42.62 kg/m   Wt Readings from Last 3 Encounters:  06/12/17 (!) 341 lb (154.7 kg)  03/17/17 (!) 337 lb (152.9 kg)  03/06/17 (!) 335 lb 8 oz (152.2 kg)    Physical Exam  Constitutional: He is oriented to person, place, and time. He appears well-developed and well-nourished. He is cooperative. No distress.  HENT:  Head: Normocephalic and atraumatic.  Eyes: EOM are normal.  Neck: Normal range of motion. Neck supple. No tracheal deviation present. No thyromegaly present.  Cardiovascular: Normal  rate, S1 normal, S2 normal and normal heart sounds. Exam reveals no gallop.  No murmur heard. Pulses:      Dorsalis pedis pulses are 1+ on the right side, and 1+ on the left side.       Posterior tibial pulses are 1+ on the right side, and 1+ on the left side.  Pulmonary/Chest: Breath sounds normal. No respiratory distress. He has no wheezes.  Abdominal: Soft. Bowel sounds are normal. He exhibits no distension. There is no tenderness. There is no guarding and no CVA tenderness.  Musculoskeletal: He exhibits no edema.       Right shoulder: He exhibits no swelling and no deformity.  Neurological: He is alert and oriented to person, place, and time. He has normal strength and normal reflexes. No cranial nerve deficit or sensory  deficit. Gait normal.  Skin: Skin is warm and dry. No rash noted. No cyanosis. Nails show no clubbing.  Psychiatric: He has a normal mood and affect. His speech is normal and behavior is normal. Judgment and thought content normal. Cognition and memory are normal.     Diabetic Labs (most recent): Lab Results  Component Value Date   HGBA1C 8.8 (H) 05/27/2017   HGBA1C 8.3 (H) 03/02/2017   HGBA1C 8.4 (H) 11/07/2016   Lipid Panel     Component Value Date/Time   CHOL 107 03/02/2017 1615   TRIG 164 (H) 03/02/2017 1615   HDL 39 (L) 03/02/2017 1615   CHOLHDL 2.7 03/02/2017 1615   VLDL 56 (H) 01/28/2016 1602   LDLCALC 41 01/28/2016 1602   CMP     Component Value Date/Time   NA 136 03/02/2017 1615   K 4.2 03/02/2017 1615   CL 101 03/02/2017 1615   CO2 29 03/02/2017 1615   GLUCOSE 215 (H) 03/02/2017 1615   BUN 15 03/02/2017 1615   CREATININE 1.26 03/02/2017 1615   CALCIUM 8.9 03/02/2017 1615   PROT 6.3 (L) 11/18/2016 0430   ALBUMIN 2.4 (L) 11/18/2016 0430   AST 28 11/18/2016 0430   ALT 22 11/18/2016 0430   ALKPHOS 71 11/18/2016 0430   BILITOT 0.9 11/18/2016 0430   GFRNONAA 59 (L) 11/20/2016 0450   GFRNONAA 65 10/06/2016 1235   GFRAA >60 11/20/2016 0450    GFRAA 75 10/06/2016 1235      Assessment & Plan:   1. 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 These are all discussed in detail with the patient.  -  Recently, during evaluation and screening for the surgery he was found to have significant coronary artery disease which led to quadruple bypass coronary artery surgery.  - His repeat labs show A1c higher at 8.8% from  8.3%. He admits to dietary indiscretion over the holidays.   Glucose logs and insulin administration records pertaining to this visit,  to be scanned into patient's records.  Recent labs reviewed.   - I have re-counseled the patient on diet management and weight loss  by adopting a carbohydrate restricted / protein rich  Diet.  -  Suggestion is made for him to avoid simple carbohydrates  from his diet including Cakes, Sweet Desserts / Pastries, Ice Cream, Soda (diet and regular), Sweet Tea, Candies, Chips, Cookies, Store Bought Juices, Alcohol in Excess of  1-2 drinks a day, Artificial Sweeteners, and "Sugar-free" Products. This will help patient to have stable blood glucose profile and potentially avoid unintended weight gain.  - Patient is advised to stick to a routine mealtimes to eat 3 meals  a day and avoid unnecessary snacks ( to snack only to correct hypoglycemia). - I have approached patient with the following individualized plan to manage diabetes and patient agrees.  -Continue  basal insulin Basaglar  80 units qhs, and increase   Novolog   to 22 units  3 times a day before meals  for pre-meal BG readings of 90-150mg /dl, plus patient specific correction dose of rapid acting insulin for unexpected hyperglycemia above 150mg /dl, associated with strict monitoring of BG AC and HS.  -Adjustment parameters  for hypo and hyperglycemia were given in a written document to patient. -Patient is encouraged to call  clinic for blood glucose levels less than 70 or above 300 mg /dl.  I will continue  metformin  500 mg po  twice a day , and continue  Victoza  1.8 mg sq daily to advance as tolerated. - He did not get the [employer] device.  - Patient specific target  for A1c; LDL, HDL, Triglycerides, and  Waist Circumference were discussed in detail.  2) BP/HTN: His blood pressure is controlled to target.   advised him to continue current medications  including ACEI/ARB. 3) Lipids/HPL: Controlled , LDL at 44. continue statins. 4)  Weight/Diet:  He is struggling with his weight.  CDE consult in progress, exercise, and carbohydrates information provided. He is recovering from coronary artery bypass surgery. He plans to pursue the bariatric surgery after proper rehabilitation. Given his relatively young age, he would still benefit from bariatric surgery and I encouraged him to continue follow-up with the bariatric group.  5) Chronic Care/Health Maintenance:  -Patient is on ACEI/ARB and Statin medications and encouraged to continue to follow up with Ophthalmology, cardiology, Podiatrist at least yearly or according to recommendations, and advised to  stay away from smoking. I have recommended yearly flu vaccine and pneumonia vaccination at least every 5 years; moderate intensity exercise for up to 150 minutes weekly; and  sleep for at least 7 hours a day.  - Time spent with the patient: 25 min, of which >50% was spent in reviewing his blood glucose logs , discussing his hypo- and hyper-glycemic episodes, reviewing his current and  previous labs and insulin doses and developing a plan to avoid hypo- and hyper-glycemia. Please refer to Patient Instructions for Blood Glucose Monitoring and Insulin/Medications Dosing Guide"  in media tab for additional information.   - I advised patient to maintain close follow up  with Sinda Du, MD for primary care needs.  Follow up plan: -Return in about 3 months (around 09/10/2017) for follow up with pre-visit labs, meter, and logs.  Glade Lloyd, MD Phone: [phone]  Fax: [phone]  -  This note was partially dictated with voice recognition software. Similar sounding words can be transcribed inadequately or may not  be corrected upon review.  06/12/2017, 8:38 AM

## 2017-06-15 ENCOUNTER — Other Ambulatory Visit: Payer: Self-pay | Admitting: "Endocrinology

## 2017-06-19 ENCOUNTER — Other Ambulatory Visit: Payer: Self-pay | Admitting: "Endocrinology

## 2017-06-25 ENCOUNTER — Encounter: Payer: Self-pay | Admitting: Gastroenterology

## 2017-08-12 ENCOUNTER — Other Ambulatory Visit: Payer: Self-pay | Admitting: Gastroenterology

## 2017-08-24 NOTE — Progress Notes (Signed)
Cardiology Office Note  Date: 08/28/2017   ID: [name], DOB 27-Jul-1955, MRN [id number]  PCP: Sinda Du, MD  Primary Cardiologist: Rozann Lesches, MD   Chief Complaint  Patient presents with  . Coronary Artery Disease    History of Present Illness: Kelly [name] is a 62 y.o. male last seen in October 2018.  He is here today with his wife.  He reports no angina symptoms or increasing dyspnea on exertion, still working full-time.  He does have residual postsurgical thoracic discomfort.  No palpitations, dizziness, or syncope.  He reports undergoing evaluation for bariatric surgery.  He seems to be enthusiastic about this possibility and is seeing a provider in [place].  I reviewed his medications which are outlined below.  Cardiac regimen includes aspirin, Lasix, Lopressor, potassium supplements, Zocor, and as needed nitroglycerin.  He continues to follow with endocrinology for management of diabetes mellitus.  Last hemoglobin A1c was 8.8.  Past Medical History:  Diagnosis Date  . Anxiety   . Arthritis   . Coronary atherosclerosis of native coronary artery    BMS LAD and PTCA RCA 2000,BMS RCA/PLA 2001, CABG June 2018  . Diverticulitis   . Essential hypertension   . Fatty liver   . Gastric ulcer   . GERD (gastroesophageal reflux disease)   . Hyperlipidemia   . Low-grade NHL (non-Hodgkin's lymphoma) 2007  . Lupus anticoagulant positive   . MI (myocardial infarction) ([place]) 2001  . Peripheral neuropathy   . Type 2 diabetes mellitus ([place])   . Umbilical hernia     Past Surgical History:  Procedure Laterality Date  . Axillary abscess     left incision and drainage left  . BACK SURGERY     multiple  . BIOPSY  08/15/2014   Procedure: BIOPSY;  Surgeon: Kelly Binder, MD;  Location: [facility];  Service: Endoscopy;;  Gastric  . BIOPSY  01/19/2015   Procedure: BIOPSY (GASTRIC ULCER);  Surgeon: Kelly Binder, MD;  Location: [facility];  Service: Endoscopy;;  .  CARDIAC CATHETERIZATION    . cardiac stents     x5 stents  . COLONOSCOPY  June 2008   Dr. Dellis Filbert [name]: Mild left colonic diverticulosis  . CORONARY ARTERY BYPASS GRAFT N/A 11/11/2016   Procedure: CORONARY ARTERY BYPASS GRAFTING (CABG) x 3, USING LEFT MAMMARY ARTERY AND RIGHT GREATER SAPHENOUS VEIN HARVESTED ENDOSCOPICALLY;  Surgeon: Kelly Poot, MD;  Location: [facility];  Service: Open Heart Surgery;  Laterality: N/A;  . Dental extractions    . ESOPHAGOGASTRODUODENOSCOPY  June 2008   Dr. Dellis Filbert [name]: Gastric ulcer, antral, biopsy with reactive changes associated glandular atrophy, no H pylori. No features of lymphoma. Likely NSAID related  . ESOPHAGOGASTRODUODENOSCOPY  August 2008   Dr. Dellis Filbert [name]: Complete healing of gastric ulcers.  . ESOPHAGOGASTRODUODENOSCOPY (EGD) WITH PROPOFOL N/A 08/15/2014   SLF: 1. Abnormal pain due to ulcers & gastriitis   . ESOPHAGOGASTRODUODENOSCOPY (EGD) WITH PROPOFOL N/A 01/19/2015   SLF: 1. Persistant ulcer with firm base 2. single gastric polyp found in the gastric antrum. Ulcer base   . EUS N/A 03/21/2015   Thickened gastric wall in pre-pyloric antrum, no obvious intramural mass, fatty pancreas, no obvious pancreatic mass.   . Inguinal lymph node biopsy     right inguinal lymph node biopsy  . INTRAVASCULAR PRESSURE WIRE/FFR STUDY N/A 09/26/2016   Procedure: Intravascular Pressure Wire/FFR Study;  Surgeon: Kelly Bush, MD;  Location: [facility];  Service: Cardiovascular;  Laterality: N/A;  .  LEFT HEART CATH AND CORONARY ANGIOGRAPHY N/A 09/26/2016   Procedure: Left Heart Cath and Coronary Angiography;  Surgeon: Kelly Bush, MD;  Location: [facility];  Service: Cardiovascular;  Laterality: N/A;  . NECK SURGERY    . POLYPECTOMY  01/19/2015   Procedure: POLYPECTOMY (GASTRIC);  Surgeon: Kelly Binder, MD;  Location: [facility];  Service: Endoscopy;;  . SHOULDER ARTHROSCOPY Left   . TEE WITHOUT CARDIOVERSION N/A 11/11/2016   Procedure:  TRANSESOPHAGEAL ECHOCARDIOGRAM (TEE);  Surgeon: Kelly [name], Kelly Salina, MD;  Location: [facility];  Service: Open Heart Surgery;  Laterality: N/A;    Current Outpatient Medications  Medication Sig Dispense Refill  . acetaminophen (TYLENOL) 325 MG tablet Take 650 mg by mouth every 6 (six) hours as needed for mild pain.    Marland Kitchen aspirin EC 81 MG tablet Take 81 mg by mouth daily.    . cholecalciferol (VITAMIN D) 1000 units tablet Take 2,000 Units by mouth daily.    . Continuous Blood Gluc Sensor (FREESTYLE LIBRE SENSOR SYSTEM) MISC Use one sensor every 10 days. 3 each 2  . FLUoxetine (PROZAC) 20 MG capsule Take 20 mg by mouth daily.    . furosemide (LASIX) 40 MG tablet Take 40 mg by mouth.    . gabapentin (NEURONTIN) 300 MG capsule Take 300 mg by mouth at bedtime.    . insulin aspart (NOVOLOG FLEXPEN) 100 UNIT/ML FlexPen Inject 22-28 Units into the skin 3 (three) times daily with meals. 30 mL 3  . Insulin Glargine (BASAGLAR KWIKPEN) 100 UNIT/ML SOPN INJECT 80 UNITS INTO THE SKIN DAILY AT 10PM 30 mL 2  . metFORMIN (GLUCOPHAGE) 500 MG tablet Take 1 tablet (500 mg total) by mouth 2 (two) times daily with a meal. 60 tablet 3  . metoprolol (LOPRESSOR) 50 MG tablet Take 25 mg by mouth 2 (two) times daily.      . nitroGLYCERIN (NITROSTAT) 0.4 MG SL tablet Place 1 tablet (0.4 mg total) under the tongue every 5 (five) minutes as needed. 25 tablet 3  . ONETOUCH VERIO test strip USE AS DIRECTED FOUR TIMES DAILY 450 each 1  . oxyCODONE (ROXICODONE) 15 MG immediate release tablet Take 1 tablet (15 mg total) by mouth every 4 (four) hours as needed for pain. 30 tablet 0  . pantoprazole (PROTONIX) 40 MG tablet TAKE 1 TABLET BY MOUTH BEFORE BREAKFAST 90 tablet 3  . potassium chloride SA (K-DUR,KLOR-CON) 20 MEQ tablet Take 20 mEq by mouth daily.    . simvastatin (ZOCOR) 10 MG tablet TAKE 1 TABLET BY MOUTH EVERY NIGHT AT BEDTIME 30 tablet 2  . tamsulosin (FLOMAX) 0.4 MG CAPS capsule Take 1 capsule by mouth every evening.  12  .  VICTOZA 18 MG/3ML SOPN ADMINISTER 1.8 MG UNDER THE SKIN EVERY DAY 9 mL 3  . zolpidem (AMBIEN) 10 MG tablet Take 10 mg by mouth at bedtime as needed for sleep.     No current facility-administered medications for this visit.    Allergies:  No known allergies   Social History: The patient  reports that he quit smoking about 23 years ago. His smoking use included cigarettes. He started smoking about 46 years ago. He has a 23.00 pack-year smoking history. He has quit using smokeless tobacco. His smokeless tobacco use included snuff and chew. He reports that he does not drink alcohol or use drugs.   Family History: The patient's family history includes Diabetes in his father; Heart disease in his father; Hypertension in his mother; Obesity in his  mother; Pancreatic cancer in his maternal grandmother; Thyroid disease in his father.   ROS:  Please see the history of present illness. Otherwise, complete review of systems is positive for none.  All other systems are reviewed and negative.   Physical Exam: VS:  BP 112/62   Pulse 72   Ht 6\' 3"  (1.905 m)   Wt (!) 336 lb (152.4 kg)   SpO2 96%   BMI 42.00 kg/m , BMI Body mass index is 42 kg/m.  Wt Readings from Last 3 Encounters:  08/28/17 (!) 336 lb (152.4 kg)  06/12/17 (!) 341 lb (154.7 kg)  03/17/17 (!) 337 lb (152.9 kg)    General: Morbidly obese male, appears comfortable at rest. HEENT: Conjunctiva and lids normal, oropharynx clear. Neck: Supple, no elevated JVP or carotid bruits, no thyromegaly. Lungs: Clear to auscultation, nonlabored breathing at rest. Recs: Well-healed sternal incision. Cardiac: Regular rate and rhythm, no S3, 2/6 systolic murmur. Abdomen: Obese, nontender, bowel sounds present. Extremities: No pitting edema, distal pulses 2+. Skin: Warm and dry. Musculoskeletal: No kyphosis. Neuropsychiatric: Alert and oriented x3, affect grossly appropriate.  ECG: I personally reviewed the tracing from 12/15/2016 which showed  sinus rhythm with prolonged PR interval, right bundle branch block, and left anterior fascicular block.  Recent Labwork: 11/12/2016: Magnesium 1.6 11/18/2016: ALT 22; AST 28 11/19/2016: Hemoglobin 9.4; Platelets 170 03/02/2017: BUN 15; Creat 1.26; Potassium 4.2; Sodium 136     Component Value Date/Time   CHOL 107 03/02/2017 1615   TRIG 164 (H) 03/02/2017 1615   HDL 39 (L) 03/02/2017 1615   CHOLHDL 2.7 03/02/2017 1615   VLDL 56 (H) 01/28/2016 1602   LDLCALC 44 03/02/2017 1615    Other Studies Reviewed Today:  Intraoperative TEE 11/11/2016:  Left ventricle: Normal cavity size. Concentric hypertrophy of mild severity. LV systolic function is low normal with an EF of 50-55%. There are no obvious wall motion abnormalities. No thrombus present. No mass present.  Left atrium: Lipomatous hypertrophy (normal variant) of the interatrial septum is noted. Patent foramen ovale not present. LAA small and difficult to visualize. No thrombus noted.  Aortic valve: The valve is trileaflet. Mild valve thickening present. Mild valve calcification present. Minimally decreased leaflet separation. Mild stenosis. No regurgitation. Mean gradient 6-7 mmhg.  Mitral valve: No leaflet thickening and calcification present. Mild regurgitation central jet. No MVP noted  Right ventricle: Normal wall thickness and ejection fraction. Dilated.  Post Bypass:  - Normal LV function, EF 60-65% (Dopamine infusion). Cardiac Output 5.2 &5.0, Cardiac Index 2.1 - 1.9 (post bypass and post chest closure).  - RV function normal.  - Mitral Valve unchanged, mild MR with central jet. - Aorta unchanged, no dissection present.  Cardiac catheterization 09/26/2016: Conclusions: 1. Significant three-vessel coronary artery disease including mild to moderate in-stent restenosis involving the proximal and mid LAD as well as 70% proximal LAD stenosis just beyond the first stent. FFR across these lesions is hemodynamically significant at  0.73. 60% proximal OM2 stenosis is also present, as well as chronic total occlusion of the proximal and mid RCA. 2. Normal left ventricular filling pressure. 3. Normal left ventricular contraction.  Recommendations: 1. Given three-vessel coronary artery disease, predominantly affecting the LAD and RCA, recommend surgical consultation for CABG. 2. Continue aggressive prevention. 3. Obtain transthoracic echocardiogram.  Assessment and Plan:  1.  Multivessel CAD status post CABG in June 2018.  He finished with cardiac rehabilitation and is working full-time now.  Reports no angina symptoms with some residual postsurgical thoracic  discomfort.  He reports compliance with his medications.  LVEF normal.  2.  Morbid obesity, pursuing evaluation for bariatric surgery.  He is enthusiastic about this possibility, and I do not see a specific cardiac reason for him not to proceed at this point.  3.  Hyperlipidemia, continues on statin therapy.  Last LDL 44.  4.  Type 2 diabetes mellitus, continues to follow with endocrinology.  Last hemoglobin A1c 8.8.  Current medicines were reviewed with the patient today.  Disposition: Follow-up in 6 months.  Signed, Satira Sark, MD, [facility] 08/28/2017 9:09 AM    [facility] [place], [place], [place] [address] Phone: [phone]; Fax: [phone]

## 2017-08-28 ENCOUNTER — Ambulatory Visit: Payer: Self-pay | Admitting: Cardiology

## 2017-08-28 ENCOUNTER — Ambulatory Visit: Payer: BC Managed Care – PPO | Admitting: Cardiology

## 2017-08-28 ENCOUNTER — Encounter: Payer: Self-pay | Admitting: Cardiology

## 2017-08-28 VITALS — BP 112/62 | HR 72 | Ht 75.0 in | Wt 336.0 lb

## 2017-08-28 DIAGNOSIS — E782 Mixed hyperlipidemia: Secondary | ICD-10-CM

## 2017-08-28 DIAGNOSIS — I25119 Atherosclerotic heart disease of native coronary artery with unspecified angina pectoris: Secondary | ICD-10-CM

## 2017-08-28 DIAGNOSIS — E1165 Type 2 diabetes mellitus with hyperglycemia: Secondary | ICD-10-CM | POA: Diagnosis not present

## 2017-08-28 NOTE — Patient Instructions (Signed)

## 2017-09-14 LAB — COMPLETE METABOLIC PANEL WITH GFR
AG RATIO: 1.2 (calc) (ref 1.0–2.5)
ALT: 20 U/L (ref 9–46)
AST: 28 U/L (ref 10–35)
Albumin: 3.9 g/dL (ref 3.6–5.1)
Alkaline phosphatase (APISO): 66 U/L (ref 40–115)
BILIRUBIN TOTAL: 0.6 mg/dL (ref 0.2–1.2)
BUN: 23 mg/dL (ref 7–25)
CHLORIDE: 102 mmol/L (ref 98–110)
CO2: 32 mmol/L (ref 20–32)
Calcium: 9.3 mg/dL (ref 8.6–10.3)
Creat: 1.26 mg/dL (ref 0.70–1.33)
GFR, EST AFRICAN AMERICAN: 72 mL/min/{1.73_m2} (ref >=60)
GFR, EST NON AFRICAN AMERICAN: 62 mL/min/{1.73_m2} (ref >=60)
GLOBULIN: 3.3 g/dL (ref 1.9–3.7)
Glucose, Bld: 143 mg/dL — ABNORMAL HIGH (ref 65–99)
POTASSIUM: 4.1 mmol/L (ref 3.5–5.3)
SODIUM: 140 mmol/L (ref 135–146)
TOTAL PROTEIN: 7.2 g/dL (ref 6.1–8.1)

## 2017-09-15 LAB — HEMOGLOBIN A1C
HEMOGLOBIN A1C: 8.2 %{Hb} — AB (ref <5.7)
Mean Plasma Glucose: 189 (calc)
eAG (mmol/L): 10.4 (calc)

## 2017-09-15 LAB — VITAMIN D 25 HYDROXY (VIT D DEFICIENCY, FRACTURES): Vit D, 25-Hydroxy: 41 ng/mL (ref 30–100)

## 2017-09-18 ENCOUNTER — Encounter: Payer: Self-pay | Admitting: "Endocrinology

## 2017-09-18 ENCOUNTER — Ambulatory Visit: Payer: BC Managed Care – PPO | Admitting: "Endocrinology

## 2017-09-18 VITALS — BP 118/72 | HR 72 | Ht 75.0 in | Wt 335.2 lb

## 2017-09-18 DIAGNOSIS — E1159 Type 2 diabetes mellitus with other circulatory complications: Secondary | ICD-10-CM

## 2017-09-18 DIAGNOSIS — I10 Essential (primary) hypertension: Secondary | ICD-10-CM | POA: Diagnosis not present

## 2017-09-18 DIAGNOSIS — E782 Mixed hyperlipidemia: Secondary | ICD-10-CM | POA: Diagnosis not present

## 2017-09-18 MED ORDER — INSULIN ASPART 100 UNIT/ML FLEXPEN: 25.0000 [IU] | PEN_INJECTOR | Freq: Three times a day (TID) | SUBCUTANEOUS | 3 refills | Status: DC

## 2017-09-18 MED ORDER — GLUCOSE BLOOD VI STRP: ORAL_STRIP | 1 refills | Status: AC

## 2017-09-18 NOTE — Patient Instructions (Signed)

## 2017-09-18 NOTE — Progress Notes (Signed)
Subjective:    Patient [name], male    DOB: 1956-04-07, PCP Sinda Du, MD   Past Medical History:  Diagnosis Date  . Anxiety   . Arthritis   . Coronary atherosclerosis of native coronary artery    BMS LAD and PTCA RCA 2000,BMS RCA/PLA 2001, CABG June 2018  . Diverticulitis   . Essential hypertension   . Fatty liver   . Gastric ulcer   . GERD (gastroesophageal reflux disease)   . Hyperlipidemia   . Low-grade NHL (non-Hodgkin's lymphoma) 2007  . Lupus anticoagulant positive   . MI (myocardial infarction) ([place]) 2001  . Peripheral neuropathy   . Type 2 diabetes mellitus ([place])   . Umbilical hernia    Past Surgical History:  Procedure Laterality Date  . Axillary abscess     left incision and drainage left  . BACK SURGERY     multiple  . BIOPSY  08/15/2014   Procedure: BIOPSY;  Surgeon: Danie Binder, MD;  Location: [facility];  Service: Endoscopy;;  Gastric  . BIOPSY  01/19/2015   Procedure: BIOPSY (GASTRIC ULCER);  Surgeon: Danie Binder, MD;  Location: [facility];  Service: Endoscopy;;  . CARDIAC CATHETERIZATION    . cardiac stents     x5 stents  . COLONOSCOPY  June 2008   Dr. Dellis Filbert Medoff: Mild left colonic diverticulosis  . CORONARY ARTERY BYPASS GRAFT N/A 11/11/2016   Procedure: CORONARY ARTERY BYPASS GRAFTING (CABG) x 3, USING LEFT MAMMARY ARTERY AND RIGHT GREATER SAPHENOUS VEIN HARVESTED ENDOSCOPICALLY;  Surgeon: Ivin Poot, MD;  Location: [facility];  Service: Open Heart Surgery;  Laterality: N/A;  . Dental extractions    . ESOPHAGOGASTRODUODENOSCOPY  June 2008   Dr. Dellis Filbert Medoff: Gastric ulcer, antral, biopsy with reactive changes associated glandular atrophy, no H pylori. No features of lymphoma. Likely NSAID related  . ESOPHAGOGASTRODUODENOSCOPY  August 2008   Dr. Dellis Filbert Medoff: Complete healing of gastric ulcers.  . ESOPHAGOGASTRODUODENOSCOPY (EGD) WITH PROPOFOL N/A 08/15/2014   SLF: 1. Abnormal pain due to ulcers & gastriitis   .  ESOPHAGOGASTRODUODENOSCOPY (EGD) WITH PROPOFOL N/A 01/19/2015   SLF: 1. Persistant ulcer with firm base 2. single gastric polyp found in the gastric antrum. Ulcer base   . EUS N/A 03/21/2015   Thickened gastric wall in pre-pyloric antrum, no obvious intramural mass, fatty pancreas, no obvious pancreatic mass.   . Inguinal lymph node biopsy     right inguinal lymph node biopsy  . INTRAVASCULAR PRESSURE WIRE/FFR STUDY N/A 09/26/2016   Procedure: Intravascular Pressure Wire/FFR Study;  Surgeon: Nelva Bush, MD;  Location: [facility];  Service: Cardiovascular;  Laterality: N/A;  . LEFT HEART CATH AND CORONARY ANGIOGRAPHY N/A 09/26/2016   Procedure: Left Heart Cath and Coronary Angiography;  Surgeon: Nelva Bush, MD;  Location: [facility];  Service: Cardiovascular;  Laterality: N/A;  . NECK SURGERY    . POLYPECTOMY  01/19/2015   Procedure: POLYPECTOMY (GASTRIC);  Surgeon: Danie Binder, MD;  Location: [facility];  Service: Endoscopy;;  . SHOULDER ARTHROSCOPY Left   . TEE WITHOUT CARDIOVERSION N/A 11/11/2016   Procedure: TRANSESOPHAGEAL ECHOCARDIOGRAM (TEE);  Surgeon: Prescott Gum, Collier Salina, MD;  Location: [facility];  Service: Open Heart Surgery;  Laterality: N/A;   Social History   Socioeconomic History  . Marital status: Married    Spouse name: Not on file  . Number of children: Not on file  . Years of education: Not on file  . Highest  education level: Not on file  Occupational History  . Not on file  Social Needs  . Financial resource strain: Not on file  . Food insecurity:    Worry: Not on file    Inability: Not on file  . Transportation needs:    Medical: Not on file    Non-medical: Not on file  Tobacco Use  . Smoking status: Former Smoker    Packs/day: 1.00    Years: 23.00    Pack years: 23.00    Types: Cigarettes    Start date: 10/30/1970    Last attempt to quit: 10/29/1993    Years since quitting: 23.9  . Smokeless tobacco: Former Systems developer    Types: Snuff, Chew   Substance and Sexual Activity  . Alcohol use: No    Alcohol/week: 0.0 oz  . Drug use: No  . Sexual activity: Never    Birth control/protection: None  Lifestyle  . Physical activity:    Days per week: Not on file    Minutes per session: Not on file  . Stress: Not on file  Relationships  . Social connections:    Talks on phone: Not on file    Gets together: Not on file    Attends religious service: Not on file    Active member of club or organization: Not on file    Attends meetings of clubs or organizations: Not on file    Relationship status: Not on file  Other Topics Concern  . Not on file  Social History Narrative  . Not on file   Outpatient Encounter Medications as of 09/18/2017  Medication Sig  . acetaminophen (TYLENOL) 325 MG tablet Take 650 mg by mouth every 6 (six) hours as needed for mild pain.  Marland Kitchen aspirin EC 81 MG tablet Take 81 mg by mouth daily.  . cholecalciferol (VITAMIN D) 1000 units tablet Take 2,000 Units by mouth daily.  . Continuous Blood Gluc Sensor (FREESTYLE LIBRE SENSOR SYSTEM) MISC Use one sensor every 10 days.  Marland Kitchen FLUoxetine (PROZAC) 20 MG capsule Take 20 mg by mouth daily.  . furosemide (LASIX) 40 MG tablet Take 40 mg by mouth.  . gabapentin (NEURONTIN) 300 MG capsule Take 300 mg by mouth at bedtime.  Marland Kitchen glucose blood (ONETOUCH VERIO) test strip USE AS DIRECTED FOUR- FIVE TIMES DAILY  . insulin aspart (NOVOLOG FLEXPEN) 100 UNIT/ML FlexPen Inject 25-31 Units into the skin 3 (three) times daily with meals.  . Insulin Glargine (BASAGLAR KWIKPEN) 100 UNIT/ML SOPN INJECT 80 UNITS INTO THE SKIN DAILY AT 10PM  . metFORMIN (GLUCOPHAGE) 500 MG tablet Take 1 tablet (500 mg total) by mouth 2 (two) times daily with a meal.  . metoprolol (LOPRESSOR) 50 MG tablet Take 25 mg by mouth 2 (two) times daily.    . nitroGLYCERIN (NITROSTAT) 0.4 MG SL tablet Place 1 tablet (0.4 mg total) under the tongue every 5 (five) minutes as needed.  Marland Kitchen oxyCODONE (ROXICODONE) 15 MG  immediate release tablet Take 1 tablet (15 mg total) by mouth every 4 (four) hours as needed for pain.  . pantoprazole (PROTONIX) 40 MG tablet TAKE 1 TABLET BY MOUTH BEFORE BREAKFAST  . potassium chloride SA (K-DUR,KLOR-CON) 20 MEQ tablet Take 20 mEq by mouth daily.  . simvastatin (ZOCOR) 10 MG tablet TAKE 1 TABLET BY MOUTH EVERY NIGHT AT BEDTIME  . tamsulosin (FLOMAX) 0.4 MG CAPS capsule Take 1 capsule by mouth every evening.  Marland Kitchen VICTOZA 18 MG/3ML SOPN ADMINISTER 1.8 MG UNDER THE SKIN EVERY DAY  .  zolpidem (AMBIEN) 10 MG tablet Take 10 mg by mouth at bedtime as needed for sleep.  . [DISCONTINUED] insulin aspart (NOVOLOG FLEXPEN) 100 UNIT/ML FlexPen Inject 22-28 Units into the skin 3 (three) times daily with meals.  . [DISCONTINUED] ONETOUCH VERIO test strip USE AS DIRECTED FOUR TIMES DAILY  . [DISCONTINUED] insulin aspart (NOVOLOG FLEXPEN) 100 UNIT/ML FlexPen Inject 22-28 Units into the skin 3 (three) times daily with meals.  . [DISCONTINUED] Insulin Glargine (BASAGLAR KWIKPEN) 100 UNIT/ML SOPN INJECT 80 UNITS INTO THE SKIN DAILY AT 10 PM  . [DISCONTINUED] ONETOUCH VERIO test strip USE FOUR TIMES DAILY AS DIRECTED  . [DISCONTINUED] pantoprazole (PROTONIX) 40 MG tablet TAKE 1 TABLET BY MOUTH BEFORE BREAKFAST   No facility-administered encounter medications on file as of 09/18/2017.    ALLERGIES: Allergies  Allergen Reactions  . No Known Allergies    VACCINATION STATUS:  There is no immunization history on file for this patient.  Diabetes  He presents for his follow-up diabetic visit. He has type 2 diabetes mellitus. Onset time: He was diagnosed at approximate age of 44 years. His disease course has been improving. There are no hypoglycemic associated symptoms. Pertinent negatives for hypoglycemia include no confusion, headaches, pallor or seizures. There are no diabetic associated symptoms. Pertinent negatives for diabetes include no chest pain, no fatigue, no polydipsia, no polyphagia, no  polyuria and no weakness. Symptoms are improving. Diabetic complications include heart disease and nephropathy. Risk factors for coronary artery disease include diabetes mellitus, dyslipidemia, hypertension, male sex, obesity, sedentary lifestyle and tobacco exposure. Current diabetic treatment includes intensive insulin program and oral agent (dual therapy). He is compliant with treatment most of the time. His weight is decreasing steadily. He is following a generally unhealthy diet. When asked about meal planning, he reported none. He rarely participates in exercise. His breakfast blood glucose range is generally 180-200 mg/dl. His lunch blood glucose range is generally 180-200 mg/dl. His dinner blood glucose range is generally 180-200 mg/dl. His bedtime blood glucose range is generally >200 mg/dl. His overall blood glucose range is 180-200 mg/dl. An ACE inhibitor/angiotensin II receptor blocker is being taken.  Hyperlipidemia  This is a chronic problem. The current episode started more than 1 year ago. The problem is controlled. Recent lipid tests were reviewed and are variable. Exacerbating diseases include diabetes and obesity. Pertinent negatives include no chest pain, myalgias or shortness of breath. Current antihyperlipidemic treatment includes statins. Risk factors for coronary artery disease include diabetes mellitus, dyslipidemia, hypertension, male sex and a sedentary lifestyle.  Hypertension  This is a chronic problem. The current episode started more than 1 year ago. The problem is controlled. Pertinent negatives include no chest pain, headaches, neck pain, palpitations or shortness of breath. Risk factors for coronary artery disease include dyslipidemia, diabetes mellitus, male gender, obesity, sedentary lifestyle and smoking/tobacco exposure. Past treatments include angiotensin blockers. The current treatment provides moderate improvement. Hypertensive end-organ damage includes CAD/MI.     Review of Systems  Constitutional: Negative for fatigue and unexpected weight change.  HENT: Negative for dental problem, mouth sores and trouble swallowing.   Eyes: Negative for visual disturbance.  Respiratory: Negative for cough, choking, chest tightness, shortness of breath and wheezing.   Cardiovascular: Negative for chest pain, palpitations and leg swelling.  Gastrointestinal: Negative for abdominal distention, abdominal pain, constipation, diarrhea, nausea and vomiting.  Endocrine: Negative for polydipsia, polyphagia and polyuria.  Genitourinary: Negative for dysuria, flank pain, hematuria and urgency.  Musculoskeletal: Negative for back pain, gait problem, myalgias  and neck pain.  Skin: Negative for pallor, rash and wound.  Neurological: Negative for seizures, syncope, weakness, numbness and headaches.  Psychiatric/Behavioral: Negative.  Negative for confusion and dysphoric mood.    Objective:    BP 118/72 (BP Location: Left Arm, Patient Position: Sitting, Cuff Size: Large)   Pulse 72   Ht 6\' 3"  (1.905 m)   Wt (!) 335 lb 3.2 oz (152 kg)   BMI 41.90 kg/m   Wt Readings from Last 3 Encounters:  09/18/17 (!) 335 lb 3.2 oz (152 kg)  08/28/17 (!) 336 lb (152.4 kg)  06/12/17 (!) 341 lb (154.7 kg)    Physical Exam  Constitutional: He is oriented to person, place, and time. He appears well-developed. He is cooperative. No distress.  HENT:  Head: Normocephalic and atraumatic.  Eyes: EOM are normal.  Neck: Normal range of motion. Neck supple. No tracheal deviation present. No thyromegaly present.  Cardiovascular: Normal rate, S1 normal and S2 normal. Exam reveals no gallop.  No murmur heard. Pulses:      Dorsalis pedis pulses are 1+ on the right side, and 1+ on the left side.       Posterior tibial pulses are 1+ on the right side, and 1+ on the left side.  Pulmonary/Chest: Effort normal. No respiratory distress. He has no wheezes.  Abdominal: Soft. He exhibits no  distension. There is no tenderness. There is no guarding and no CVA tenderness.  Musculoskeletal: He exhibits no edema.       Right shoulder: He exhibits no swelling and no deformity.  Neurological: He is alert and oriented to person, place, and time. He has normal strength and normal reflexes. No cranial nerve deficit or sensory deficit. Gait normal.  Skin: Skin is warm and dry. No rash noted. No cyanosis. Nails show no clubbing.  Psychiatric: He has a normal mood and affect. His speech is normal. Judgment normal. Cognition and memory are normal.     Diabetic Labs (most recent): Lab Results  Component Value Date   HGBA1C 8.2 (H) 09/14/2017   HGBA1C 8.8 (H) 05/27/2017   HGBA1C 8.3 (H) 03/02/2017   Lipid Panel     Component Value Date/Time   CHOL 107 03/02/2017 1615   TRIG 164 (H) 03/02/2017 1615   HDL 39 (L) 03/02/2017 1615   CHOLHDL 2.7 03/02/2017 1615   VLDL 56 (H) 01/28/2016 1602   LDLCALC 44 03/02/2017 1615   CMP     Component Value Date/Time   NA 140 09/14/2017 0713   K 4.1 09/14/2017 0713   CL 102 09/14/2017 0713   CO2 32 09/14/2017 0713   GLUCOSE 143 (H) 09/14/2017 0713   BUN 23 09/14/2017 0713   CREATININE 1.26 09/14/2017 0713   CALCIUM 9.3 09/14/2017 0713   PROT 7.2 09/14/2017 0713   ALBUMIN 2.4 (L) 11/18/2016 0430   AST 28 09/14/2017 0713   ALT 20 09/14/2017 0713   ALKPHOS 71 11/18/2016 0430   BILITOT 0.6 09/14/2017 0713   GFRNONAA 62 09/14/2017 0713   GFRAA 72 09/14/2017 0713      Assessment & Plan:   1. 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 These are all discussed in detail with the patient.  -  Recently, during evaluation and screening for bariatric surgery he was found to  have significant coronary artery disease which led to  quadruple bypass coronary artery surgery.  - His repeat labs show improved A1c of 8.2% from 8.8%.     Glucose logs and insulin administration records pertaining to this visit,  to be scanned into patient's records.  Recent labs reviewed.   - I have re-counseled the patient on diet management and weight loss  by adopting a carbohydrate restricted / protein rich  Diet.  -  Suggestion is made for him to avoid simple carbohydrates  from his diet including Cakes, Sweet Desserts / Pastries, Ice Cream, Soda (diet and regular), Sweet Tea, Candies, Chips, Cookies, Store Bought Juices, Alcohol in Excess of  1-2 drinks a day, Artificial Sweeteners, and "Sugar-free" Products. This will help patient to have stable blood glucose profile and potentially avoid unintended weight gain.   - Patient is advised to stick to a routine mealtimes to eat 3 meals  a day and avoid unnecessary snacks ( to snack only to correct hypoglycemia). - I have approached patient with the following individualized plan to manage diabetes and patient agrees.  -He has done reasonably very well since last visit.   -I advised him to continue  basal insulin Basaglar  80 units qhs, and increase NovoLog to 25 units  3 times a day before meals  for pre-meal BG readings of 90-150mg /dl, plus patient specific correction dose of rapid acting insulin for unexpected hyperglycemia above 150mg /dl, associated with strict monitoring of blood glucose 4 times a day-before meals and at bedtime.   -Adjustment parameters for hypo and hyperglycemia were given in a written document to patient. -Patient is encouraged to call clinic for blood glucose levels less than 70 or above 300 mg /dl.  I will continue  metformin  500 mg po  twice a day , and continue  Victoza  1.8 mg sq daily to advance as tolerated. -He would have benefited from continuous glucose monitoring, however he did not like to keep the sensor on his body and prefers to stay on finger sticking.     - Patient specific target  for A1c; LDL, HDL, Triglycerides, and  Waist Circumference were discussed in detail.  2) BP/HTN: His blood pressure is controlled to target.  I advised him to continue his current blood pressure medications including metoprolol 50 mg p.o. twice daily.   3) Lipids/HPL: His recent lipid panel showed controlled LDL at 44.  He is advised to continue simvastatin 10 mg p.o. nightly.    4)  Weight/Diet:  He is struggling with his weight.  CDE consult in progress, exercise, and carbohydrates information provided. He is recovering from coronary artery bypass surgery. He is progressing on his pursuit of bariatric surgery with [facility].   Given his relatively young age, and he is related comorbidities he is an ideal candidate for bariatric surgery and I encouraged him to continue follow-up with the bariatric group.  5) Chronic Care/Health Maintenance:  -Patient is on ACEI/ARB and Statin medications and encouraged to continue to follow up with Ophthalmology, cardiology, Podiatrist at least yearly or according to recommendations, and advised to  stay away from smoking. I have recommended yearly flu vaccine and pneumonia vaccination at least every 5 years; moderate intensity exercise for up to 150 minutes weekly; and  sleep for at least 7 hours a day.   - I advised patient to maintain close follow up with Sinda Du, MD for primary care needs.  - Time spent with the patient: 25 min, of  which >50% was spent in reviewing his blood glucose logs , discussing his hypo- and hyper-glycemic episodes, reviewing his current and  previous labs and insulin doses and developing a plan to avoid hypo- and hyper-glycemia. Please refer to Patient Instructions for Blood Glucose Monitoring and Insulin/Medications Dosing Guide"  in media tab for additional information. Kelly [name] participated in the discussions, expressed understanding, and voiced agreement with the above plans.  All  questions were answered to his satisfaction. he is encouraged to contact clinic should he have any questions or concerns prior to his return visit.  Follow up plan: -Return in about 4 months (around 01/19/2018) for meter, and logs.  Glade Lloyd, MD Phone: [phone]  Fax: [phone]  -  This note was partially dictated with voice recognition software. Similar sounding words can be transcribed inadequately or may not  be corrected upon review.  09/18/2017, 9:53 AM

## 2017-10-20 ENCOUNTER — Telehealth: Payer: Self-pay | Admitting: "Endocrinology

## 2017-10-20 ENCOUNTER — Other Ambulatory Visit: Payer: Self-pay | Admitting: "Endocrinology

## 2017-10-20 NOTE — Telephone Encounter (Signed)
Advised to lower Basaglar  to 60 units qhs, and lower NovoLog to 10 units  3 times a day before meals  for pre-meal BG readings of  above 90 mg/dl, continue to monitor blood glucose 4 times a day-before meals and at bedtime.  -Discontinue metformin and Victoza for now.  -Call clinic if blood glucose readings are greater than 200x3.

## 2017-10-20 NOTE — Telephone Encounter (Signed)
Kelly [name] is calling on behalf of Kelly [name] stating that He will be put on the Liver Shrinking Diet on June 12 and should his medication be adjusted, please advise?

## 2017-10-21 NOTE — Telephone Encounter (Signed)
Left message for pt to return our call. 

## 2017-10-22 NOTE — Telephone Encounter (Signed)
Pt's wife notified.

## 2017-11-13 ENCOUNTER — Telehealth: Payer: Self-pay

## 2017-11-13 NOTE — Telephone Encounter (Signed)
Pt called to let Dr Dorris Fetch know that he has had gastric bypass. He is not on insulin at this time. His surgeon advised him to stop medication, he states his BG reading at the highest have been 144. He will continue to monitor and let us know if his levels get higher.

## 2017-11-13 NOTE — Telephone Encounter (Signed)
Great, I want him to return in 2-3 weeks time. Advise him to monitor 2 x a day - before breakfast and before bed . Bring those readings , no need for labs.

## 2017-12-14 DIAGNOSIS — K912 Postsurgical malabsorption, not elsewhere classified: Secondary | ICD-10-CM | POA: Insufficient documentation

## 2017-12-14 DIAGNOSIS — Z9884 Bariatric surgery status: Secondary | ICD-10-CM | POA: Insufficient documentation

## 2017-12-18 ENCOUNTER — Other Ambulatory Visit: Payer: Self-pay | Admitting: "Endocrinology

## 2017-12-31 ENCOUNTER — Encounter: Payer: Self-pay | Admitting: "Endocrinology

## 2018-01-22 ENCOUNTER — Ambulatory Visit: Payer: BC Managed Care – PPO | Admitting: "Endocrinology

## 2018-03-05 ENCOUNTER — Other Ambulatory Visit (HOSPITAL_COMMUNITY): Payer: Self-pay | Admitting: Pulmonary Disease

## 2018-03-05 ENCOUNTER — Ambulatory Visit (HOSPITAL_COMMUNITY)
Admission: RE | Admit: 2018-03-05 | Discharge: 2018-03-05 | Disposition: A | Discharge status: Home / Self Care | Payer: BC Managed Care – PPO | Source: Ambulatory Visit | Attending: Pulmonary Disease | Admitting: Pulmonary Disease

## 2018-03-05 DIAGNOSIS — R2241 Localized swelling, mass and lump, right lower limb: Secondary | ICD-10-CM | POA: Insufficient documentation

## 2018-03-05 DIAGNOSIS — M79661 Pain in right lower leg: Secondary | ICD-10-CM | POA: Insufficient documentation

## 2018-03-05 DIAGNOSIS — M79604 Pain in right leg: Secondary | ICD-10-CM

## 2018-04-06 ENCOUNTER — Other Ambulatory Visit (HOSPITAL_COMMUNITY): Payer: Self-pay | Admitting: Oncology

## 2018-04-06 ENCOUNTER — Ambulatory Visit (HOSPITAL_COMMUNITY)
Admission: RE | Admit: 2018-04-06 | Discharge: 2018-04-06 | Disposition: A | Discharge status: Home / Self Care | Payer: BC Managed Care – PPO | Source: Ambulatory Visit | Attending: Oncology | Admitting: Oncology

## 2018-04-06 DIAGNOSIS — M79604 Pain in right leg: Secondary | ICD-10-CM | POA: Diagnosis not present

## 2018-08-22 ENCOUNTER — Encounter (HOSPITAL_COMMUNITY): Payer: Self-pay | Admitting: *Deleted

## 2018-08-22 ENCOUNTER — Other Ambulatory Visit: Payer: Self-pay

## 2018-08-22 ENCOUNTER — Emergency Department (HOSPITAL_COMMUNITY): Payer: BC Managed Care – PPO

## 2018-08-22 ENCOUNTER — Emergency Department (HOSPITAL_COMMUNITY)
Admission: EM | Admit: 2018-08-22 | Discharge: 2018-08-22 | Disposition: A | Discharge status: Home / Self Care | Payer: BC Managed Care – PPO | Attending: Emergency Medicine | Admitting: Emergency Medicine

## 2018-08-22 DIAGNOSIS — I2510 Atherosclerotic heart disease of native coronary artery without angina pectoris: Secondary | ICD-10-CM | POA: Diagnosis not present

## 2018-08-22 DIAGNOSIS — Z7982 Long term (current) use of aspirin: Secondary | ICD-10-CM | POA: Diagnosis not present

## 2018-08-22 DIAGNOSIS — Y9389 Activity, other specified: Secondary | ICD-10-CM | POA: Insufficient documentation

## 2018-08-22 DIAGNOSIS — Z794 Long term (current) use of insulin: Secondary | ICD-10-CM | POA: Diagnosis not present

## 2018-08-22 DIAGNOSIS — W298XXA Contact with other powered powered hand tools and household machinery, initial encounter: Secondary | ICD-10-CM | POA: Diagnosis not present

## 2018-08-22 DIAGNOSIS — Y999 Unspecified external cause status: Secondary | ICD-10-CM | POA: Insufficient documentation

## 2018-08-22 DIAGNOSIS — Z87891 Personal history of nicotine dependence: Secondary | ICD-10-CM | POA: Diagnosis not present

## 2018-08-22 DIAGNOSIS — I10 Essential (primary) hypertension: Secondary | ICD-10-CM | POA: Insufficient documentation

## 2018-08-22 DIAGNOSIS — S99921A Unspecified injury of right foot, initial encounter: Secondary | ICD-10-CM | POA: Diagnosis present

## 2018-08-22 DIAGNOSIS — S92421B Displaced fracture of distal phalanx of right great toe, initial encounter for open fracture: Secondary | ICD-10-CM

## 2018-08-22 DIAGNOSIS — W208XXA Other cause of strike by thrown, projected or falling object, initial encounter: Secondary | ICD-10-CM | POA: Insufficient documentation

## 2018-08-22 DIAGNOSIS — Z79899 Other long term (current) drug therapy: Secondary | ICD-10-CM | POA: Diagnosis not present

## 2018-08-22 DIAGNOSIS — E1140 Type 2 diabetes mellitus with diabetic neuropathy, unspecified: Secondary | ICD-10-CM | POA: Insufficient documentation

## 2018-08-22 DIAGNOSIS — Y929 Unspecified place or not applicable: Secondary | ICD-10-CM | POA: Insufficient documentation

## 2018-08-22 MED ORDER — DOXYCYCLINE HYCLATE 100 MG PO TABS
100.0000 mg | ORAL_TABLET | Freq: Once | ORAL | Status: AC
  Administered 2018-08-22: 100 mg via ORAL
  Filled 2018-08-22: qty 1

## 2018-08-22 MED ORDER — OXYCODONE-ACETAMINOPHEN 5-325 MG PO TABS: 1.0000 | ORAL_TABLET | Freq: Four times a day (QID) | ORAL | 0 refills | Status: DC | PRN

## 2018-08-22 MED ORDER — POVIDONE-IODINE 10 % EX SOLN
CUTANEOUS | Status: DC | PRN
  Administered 2018-08-22: 1 via TOPICAL
  Filled 2018-08-22: qty 15

## 2018-08-22 MED ORDER — LIDOCAINE HCL (PF) 2 % IJ SOLN
10.0000 mL | Freq: Once | INTRAMUSCULAR | Status: AC
  Administered 2018-08-22: 10 mL via INTRADERMAL

## 2018-08-22 MED ORDER — LIDOCAINE HCL (PF) 2 % IJ SOLN
INTRAMUSCULAR | Status: AC
  Filled 2018-08-22: qty 20

## 2018-08-22 MED ORDER — PROMETHAZINE HCL 12.5 MG PO TABS
12.5000 mg | ORAL_TABLET | Freq: Once | ORAL | Status: AC
  Administered 2018-08-22: 12.5 mg via ORAL
  Filled 2018-08-22: qty 1

## 2018-08-22 MED ORDER — CEPHALEXIN 500 MG PO CAPS
500.0000 mg | ORAL_CAPSULE | Freq: Once | ORAL | Status: AC
  Administered 2018-08-22: 500 mg via ORAL
  Filled 2018-08-22: qty 1

## 2018-08-22 MED ORDER — MORPHINE SULFATE (PF) 10 MG/ML IV SOLN
10.0000 mg | Freq: Once | INTRAVENOUS | Status: AC
  Administered 2018-08-22: 10 mg via INTRAMUSCULAR
  Filled 2018-08-22: qty 1

## 2018-08-22 MED ORDER — CEPHALEXIN 500 MG PO CAPS: 500.0000 mg | ORAL_CAPSULE | Freq: Four times a day (QID) | ORAL | 0 refills | Status: DC

## 2018-08-22 NOTE — Discharge Instructions (Signed)
You have a complicated open fracture of the right big toe.  Please keep your foot elevated above your waist when you are sitting and above your heart when you are lying down.  Keep your wound clean and dry.  Use Keflex with breakfast, lunch, dinner, and at bedtime.  Use Percocet every 6 hours as needed for pain not improved by Tylenol extra strength. This medication may cause drowsiness. Please do not drink, drive, or participate in activity that requires concentration while taking this medication.  Please call Dr. Doreatha Martin on Monday.  He would like to see you in the office on Tuesday, April 7.  It is important that you keep the wound clean and dry.

## 2018-08-22 NOTE — ED Triage Notes (Signed)
Pt had a drill press fall and land on right great toe, bleeding noted

## 2018-08-22 NOTE — ED Provider Notes (Signed)
[facility] EMERGENCY DEPARTMENT Provider Note   CSN: [id number] Arrival date & time: 08/22/18  1737    History   Chief Complaint Chief Complaint  Patient presents with   Toe Injury    HPI Kelly [name] is a 63 y.o. male.     Patient is a 63 year old male who presents to the emergency department with injury to the right great toe.  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  It fell from the table we were working on, and injured his right great toe.  Shortly after the incident, the patient began to have severe throbbing pain.  He went into his bedroom and took his shoe off and noticed that there was large amount of blood in the shoe.  He examined his foot after removing his sock and noted injury to the toe.  He then came to the emergency department for additional evaluation.  No other injury reported at this time.  The patient denies being on any anticoagulation medications.  He is not had any recent operations or procedures involving the right great toe.  The history is provided by the patient.    Past Medical History:  Diagnosis Date   Anxiety    Arthritis    Coronary atherosclerosis of native coronary artery    BMS LAD and PTCA RCA 2000,BMS RCA/PLA 2001, CABG June 2018   Diverticulitis    Essential hypertension    Fatty liver    Gastric ulcer    GERD (gastroesophageal reflux disease)    Hyperlipidemia    Low-grade NHL (non-Hodgkin's lymphoma) 2007   Lupus anticoagulant positive    MI (myocardial infarction) ([place]) 2001   Peripheral neuropathy    Type 2 diabetes mellitus ([place])    Umbilical hernia     Patient Active Problem List   Diagnosis Date Noted   Dehiscence of external surgical wound 11/26/2016   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   Coronary artery disease 11/11/2016   Accelerating angina ([place]) 09/26/2016   Morbid obesity due to excess calories ([place]) 11/09/2015   Colon cancer screening 12/28/2014   Dyspepsia,  NEW ONSET 07/26/2014   Hepatic cirrhosis (H[facility]) 05/07/2014   Low-grade NHL (non-Hodgkin's lymphoma) 05/16/2011   Lupus anticoagulant positive 05/16/2011   Gastric ulcer 05/16/2011   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   Mixed hyperlipidemia 05/22/2009   Essential hypertension 05/22/2009   CORONARY ATHEROSCLEROSIS NATIVE CORONARY ARTERY 05/22/2009    Past Surgical History:  Procedure Laterality Date   Axillary abscess     left incision and drainage left   BACK SURGERY     multiple   BIOPSY  08/15/2014   Procedure: BIOPSY;  Surgeon: Danie Binder, MD;  Location: [facility];  Service: Endoscopy;;  Gastric   BIOPSY  01/19/2015   Procedure: BIOPSY (GASTRIC ULCER);  Surgeon: Danie Binder, MD;  Location: [facility];  Service: Endoscopy;;   CARDIAC CATHETERIZATION     cardiac stents     x5 stents   COLONOSCOPY  June 2008   Dr. Dellis Filbert Medoff: Mild left colonic diverticulosis   CORONARY ARTERY BYPASS GRAFT N/A 11/11/2016   Procedure: CORONARY ARTERY BYPASS GRAFTING (CABG) x 3, USING LEFT MAMMARY ARTERY AND RIGHT GREATER SAPHENOUS VEIN HARVESTED ENDOSCOPICALLY;  Surgeon: Ivin Poot, MD;  Location: [place];  Service: Open Heart Surgery;  Laterality: N/A;   Dental extractions     ESOPHAGOGASTRODUODENOSCOPY  June 2008   Dr. Dellis Filbert Medoff: Gastric ulcer,  antral, biopsy with reactive changes associated glandular atrophy, no H pylori. No features of lymphoma. Likely NSAID related   ESOPHAGOGASTRODUODENOSCOPY  August 2008   Dr. Dellis Filbert Medoff: Complete healing of gastric ulcers.   ESOPHAGOGASTRODUODENOSCOPY (EGD) WITH PROPOFOL N/A 08/15/2014   SLF: 1. Abnormal pain due to ulcers & gastriitis    ESOPHAGOGASTRODUODENOSCOPY (EGD) WITH PROPOFOL N/A 01/19/2015   SLF: 1. Persistant ulcer with firm base 2. single gastric polyp found in the gastric antrum. Ulcer base    EUS N/A 03/21/2015   Thickened gastric wall in pre-pyloric antrum, no obvious intramural mass,  fatty pancreas, no obvious pancreatic mass.    Inguinal lymph node biopsy     right inguinal lymph node biopsy   INTRAVASCULAR PRESSURE WIRE/FFR STUDY N/A 09/26/2016   Procedure: Intravascular Pressure Wire/FFR Study;  Surgeon: Nelva Bush, MD;  Location: [place] [facility];  Service: Cardiovascular;  Laterality: N/A;   LEFT HEART CATH AND CORONARY ANGIOGRAPHY N/A 09/26/2016   Procedure: Left Heart Cath and Coronary Angiography;  Surgeon: Nelva Bush, MD;  Location: [place] [facility];  Service: Cardiovascular;  Laterality: N/A;   NECK SURGERY     POLYPECTOMY  01/19/2015   Procedure: POLYPECTOMY (GASTRIC);  Surgeon: Danie Binder, MD;  Location: [facility];  Service: Endoscopy;;   SHOULDER ARTHROSCOPY Left    TEE WITHOUT CARDIOVERSION N/A 11/11/2016   Procedure: TRANSESOPHAGEAL ECHOCARDIOGRAM (TEE);  Surgeon: Prescott Gum, Collier Salina, MD;  Location: [place];  Service: Open Heart Surgery;  Laterality: N/A;        Home Medications    Prior to Admission medications   Medication Sig Start Date End Date Taking? Authorizing Provider  acetaminophen (TYLENOL) 325 MG tablet Take 650 mg by mouth every 6 (six) hours as needed for mild pain.    [provider]  aspirin EC 81 MG tablet Take 81 mg by mouth daily.    [provider]  cholecalciferol (VITAMIN D) 1000 units tablet Take 2,000 Units by mouth daily.    [provider]  Continuous Blood Gluc Sensor (FREESTYLE LIBRE SENSOR SYSTEM) MISC Use one sensor every 10 days. 03/06/17   Cassandria Anger, MD  FLUoxetine (PROZAC) 20 MG capsule Take 20 mg by mouth daily.    [provider]  furosemide (LASIX) 40 MG tablet Take 40 mg by mouth.    [provider]  gabapentin (NEURONTIN) 300 MG capsule Take 300 mg by mouth at bedtime.    [provider]  glucose blood (ONETOUCH VERIO) test strip USE AS DIRECTED FOUR- FIVE TIMES DAILY 09/18/17   Nida, Marella Chimes, MD  insulin aspart (NOVOLOG FLEXPEN)  100 UNIT/ML FlexPen Inject 25-31 Units into the skin 3 (three) times daily with meals. 09/18/17   Cassandria Anger, MD  Insulin Glargine (BASAGLAR KWIKPEN) 100 UNIT/ML SOPN INJECT 80 UNITS INTO SKIN DAILY AT 10 PM 10/20/17   Cassandria Anger, MD  metFORMIN (GLUCOPHAGE) 500 MG tablet Take 1 tablet (500 mg total) by mouth 2 (two) times daily with a meal. 09/29/16   End, Harrell Gave, MD  metoprolol (LOPRESSOR) 50 MG tablet Take 25 mg by mouth 2 (two) times daily.      [provider]  nitroGLYCERIN (NITROSTAT) 0.4 MG SL tablet Place 1 tablet (0.4 mg total) under the tongue every 5 (five) minutes as needed. 06/01/15   Satira Sark, MD  [facility] VERIO test strip USE TO TEST FOUR TIMES DAILY AS DIRECTED 12/22/17   Cassandria Anger, MD  oxyCODONE (ROXICODONE) 15  MG immediate release tablet Take 1 tablet (15 mg total) by mouth every 4 (four) hours as needed for pain. 11/21/16   Barrett, Erin R, PA-C  pantoprazole (PROTONIX) 40 MG tablet TAKE 1 TABLET BY MOUTH BEFORE BREAKFAST 08/12/17   Annitta Needs, NP  potassium chloride SA (K-DUR,KLOR-CON) 20 MEQ tablet Take 20 mEq by mouth daily.    [provider]  simvastatin (ZOCOR) 10 MG tablet TAKE 1 TABLET BY MOUTH EVERY NIGHT AT BEDTIME 04/29/12   Satira Sark, MD  tamsulosin (FLOMAX) 0.4 MG CAPS capsule Take 1 capsule by mouth every evening. 10/20/16   [provider]  VICTOZA 18 MG/3ML SOPN ADMINISTER 1.8 MG UNDER THE SKIN EVERY DAY 06/01/17   Cassandria Anger, MD  zolpidem (AMBIEN) 10 MG tablet Take 10 mg by mouth at bedtime as needed for sleep.    [provider]    Family History Family History  Problem Relation Age of Onset   Pancreatic cancer Maternal Grandmother    Obesity Mother    Hypertension Mother    Diabetes Father    Thyroid disease Father    Heart disease Father    Colon cancer Neg Hx     Social History Social History   Tobacco Use   Smoking status: Former Smoker     Packs/day: 1.00    Years: 23.00    Pack years: 23.00    Types: Cigarettes    Start date: 10/30/1970    Last attempt to quit: 10/29/1993    Years since quitting: 24.8   Smokeless tobacco: Former Systems developer    Types: Snuff, Chew  Substance Use Topics   Alcohol use: No    Alcohol/week: 0.0 standard drinks   Drug use: No     Allergies   No known allergies   Review of Systems Review of Systems  Constitutional: Negative for activity change.       All ROS Neg except as noted in HPI  HENT: Negative for nosebleeds.   Eyes: Negative for photophobia and discharge.  Respiratory: Negative for cough, shortness of breath and wheezing.   Cardiovascular: Negative for chest pain and palpitations.  Gastrointestinal: Negative for abdominal pain and blood in stool.  Genitourinary: Negative for dysuria, frequency and hematuria.  Musculoskeletal: Negative for arthralgias, back pain and neck pain.       Toe injury  Skin: Negative.   Neurological: Negative for dizziness, seizures and speech difficulty.  Psychiatric/Behavioral: Negative for confusion and hallucinations.     Physical Exam Updated Vital Signs BP 137/62 (BP Location: Left Arm)    Pulse 66    Temp 97.9 F (36.6 C) (Oral)    Resp 12    Ht 6\' 2"  (1.88 m)    Wt 98 kg    SpO2 100%    BMI 27.73 kg/m   Physical Exam Vitals signs and nursing note reviewed.  Constitutional:      Appearance: He is well-developed. He is not toxic-appearing.  HENT:     Head: Normocephalic.     Right Ear: Tympanic membrane and external ear normal.     Left Ear: Tympanic membrane and external ear normal.  Eyes:     General: Lids are normal.     Pupils: Pupils are equal, round, and reactive to light.  Neck:     Musculoskeletal: Normal range of motion and neck supple.     Vascular: No carotid bruit.  Cardiovascular:     Rate and Rhythm: Normal rate and regular rhythm.  Pulses: Normal pulses.     Heart sounds: Normal heart sounds.  Pulmonary:      Effort: No respiratory distress.     Breath sounds: Normal breath sounds.  Abdominal:     General: Bowel sounds are normal.     Palpations: Abdomen is soft.     Tenderness: There is no abdominal tenderness. There is no guarding.  Musculoskeletal: Normal range of motion.  Feet:     Comments: There is good range of motion of the right knee and ankle.  The dorsalis pedis pulse is 2+.  There is noted disruption of the toenail of the right great toe.  There is a laceration that involves the lateral aspect of the right great toe.  There is no deformity of the MP joint.  No injury or deformity of the other toes. Lymphadenopathy:     Head:     Right side of head: No submandibular adenopathy.     Left side of head: No submandibular adenopathy.     Cervical: No cervical adenopathy.  Skin:    General: Skin is warm and dry.  Neurological:     Mental Status: He is alert and oriented to person, place, and time.     Cranial Nerves: No cranial nerve deficit.     Sensory: No sensory deficit.  Psychiatric:        Speech: Speech normal.      ED Treatments / Results  Labs (all labs ordered are listed, but only abnormal results are displayed) Labs Reviewed - No data to display  EKG None  Radiology Dg Toe Great Right  Result Date: 08/22/2018 CLINICAL DATA:  Crush injury to the great toe with bleeding. EXAM: RIGHT GREAT TOE COMPARISON:  None. FINDINGS: Comminuted fracture of the distal phalanx. Multiple air bubbles present within the soft tissues. Possible open fracture. Fracture lines do extend to the articular surface but the articular surfaces largely on disrupted. No fracture of the proximal phalanx. No significant displacement or angulation. Regional arterial calcification incidentally noted. IMPRESSION: Comminuted fracture of the distal phalanx without significant angulation or displacement. Multiple air bubbles in the soft tissues. Electronically Signed   By: Nelson Chimes M.D.   On: 08/22/2018  18:44    Procedures .Marland KitchenLaceration Repair Date/Time: 08/22/2018 8:49 PM Performed by: Lily Kocher, PA-C Authorized by: Lily Kocher, PA-C   Consent:    Consent obtained:  Verbal   Consent given by:  Patient   Risks discussed:  Infection, pain, poor cosmetic result and poor wound healing Universal protocol:    Procedure explained and questions answered to patient or proxy's satisfaction: yes     Immediately prior to procedure, a time out was called: yes     Patient identity confirmed:  Arm band Anesthesia (see MAR for exact dosages):    Anesthesia method:  Nerve block   Block location:  Rt big toe   Block needle gauge:  25 G   Block anesthetic:  Lidocaine 1% w/o epi   Block technique:  Digital   Block injection procedure:  Anatomic landmarks identified, introduced needle, incremental injection and negative aspiration for blood   Block outcome:  Anesthesia achieved Laceration details:    Location:  Toe   Toe location:  R big toe   Length (cm):  3.7 Repair type:    Repair type:  Intermediate Pre-procedure details:    Preparation:  Patient was prepped and draped in usual sterile fashion and imaging obtained to evaluate for foreign bodies Exploration:  Hemostasis achieved with:  Direct pressure   Wound exploration: wound explored through full range of motion and entire depth of wound probed and visualized     Wound extent: underlying fracture     Wound extent: no foreign bodies/material noted, no nerve damage noted and no tendon damage noted     Contaminated: no   Treatment:    Area cleansed with:  Betadine (betadine soak)   Amount of cleaning:  Extensive   Irrigation solution:  Sterile saline   Visualized foreign bodies/material removed: no   Skin repair:    Repair method:  Sutures   Suture size:  4-0   Suture material:  Nylon   Suture technique:  Simple interrupted   Number of sutures:  9 Approximation:    Approximation:  Close Post-procedure details:    Dressing:   Antibiotic ointment and sterile dressing   Patient tolerance of procedure:  Tolerated well, no immediate complications  .Ortho Injury Treatment Date/Time: 08/22/2018 8:55 PM Performed by: Lily Kocher, PA-C Authorized by: Lily Kocher, PA-C   Consent:    Consent obtained:  Verbal   Consent given by:  Patient Universal protocol:    Procedure explained and questions answered to patient or proxy's satisfaction: yes     Imaging studies available: yes     Immediately prior to procedure a time out was called: yes     Patient identity confirmed:  Arm bandInjury location: toe Location details: right great toe Injury type: fracture Fracture type: distal phalanx Pre-procedure neurovascular assessment: neurovascularly intact Pre-procedure range of motion: reduced  Anesthesia: Local anesthesia used: digital block. Manipulation performed: no Immobilization: Buddy Tape and POst op shoe. Supplies used: cotton padding and elastic bandage (Ace bandage, post op shoe) Post-procedure distal perfusion: normal Post-procedure neurological function: normal Post-procedure range of motion: unchanged Patient tolerance: Patient tolerated the procedure well with no immediate complications    (including critical care time)  Medications Ordered in ED Medications  povidone-iodine (BETADINE) 10 % external solution (1 application Topical Given 08/22/18 1908)  promethazine (PHENERGAN) tablet 12.5 mg (12.5 mg Oral Given 08/22/18 1907)  Morphine Sulfate (PF) SOLN 10 mg (10 mg Intramuscular Given 08/22/18 1907)  doxycycline (VIBRA-TABS) tablet 100 mg (100 mg Oral Given 08/22/18 1907)  cephALEXin (KEFLEX) capsule 500 mg (500 mg Oral Given 08/22/18 1907)  lidocaine (XYLOCAINE) 2 % injection 10 mL (10 mLs Intradermal Given 08/22/18 1909)     Initial Impression / Assessment and Plan / ED Course  I have reviewed the triage vital signs and the nursing notes.  Pertinent labs & imaging results that were available during  my care of the patient were reviewed by me and considered in my medical decision making (see chart for details).          Final Clinical Impressions(s) / ED Diagnoses MDM Patient had a drill press to fall on the right foot.  The patient has a comminuted fracture of the distal portion of the right great toe.  This is an open fracture as the patient has an extensive laceration present.  The nail was reattached.  The laceration was repaired.  The patient was fitted with buddy tape splint and postoperative shoe.  Crutches were offered, but the patient declines using crutches at this time.  Patient given medication for assistance with his pain.  Case discussed with Dr.Haddix.  He will see the patient on Tuesday, April 7.  Have asked the patient to elevate the foot above his waist when sitting and above his heart  when lying down.  Prescription for medication for pain and antibiotic given to the patient.  Questions were answered.  Patient is in agreement with this plan.   Final diagnoses:  Open displaced fracture of distal phalanx of right great toe, initial encounter    ED Discharge Orders         Ordered    cephALEXin (KEFLEX) 500 MG capsule  4 times daily     08/22/18 2038    oxyCODONE-acetaminophen (PERCOCET/ROXICET) 5-325 MG tablet  Every 6 hours PRN     08/22/18 2038           Lily Kocher, PA-C 08/24/18 0032    Isla Pence, MD 09/01/18 1101

## 2018-09-03 ENCOUNTER — Other Ambulatory Visit: Payer: Self-pay | Admitting: Gastroenterology

## 2018-09-09 IMAGING — DX DG CHEST 2V
2 series · 2 of 2 positions shown · non-contrast
Comparison: Radiographs November 28, 2010.

CLINICAL DATA: Chest pain.

EXAM:
CHEST  2 VIEW

[chest pa]
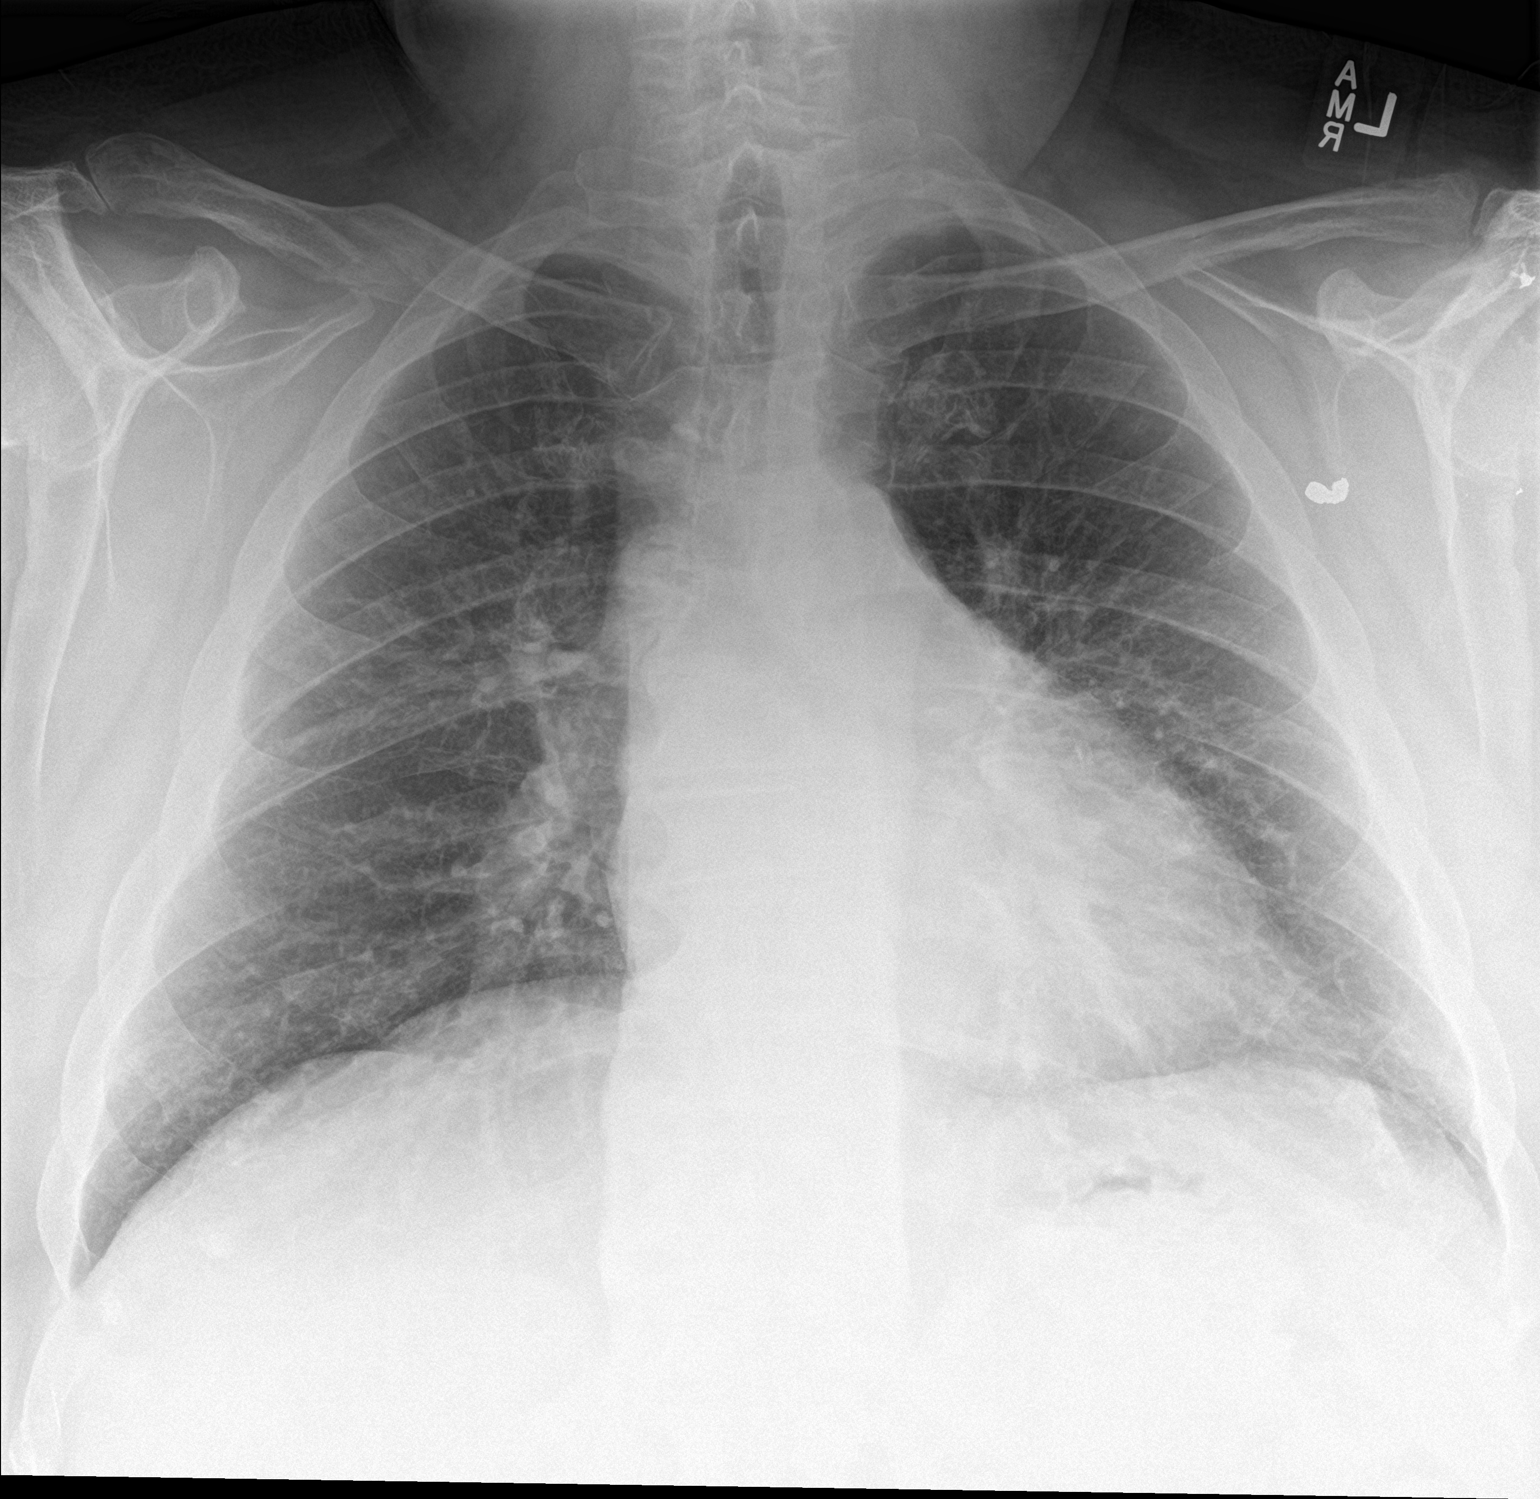

[chest lat]
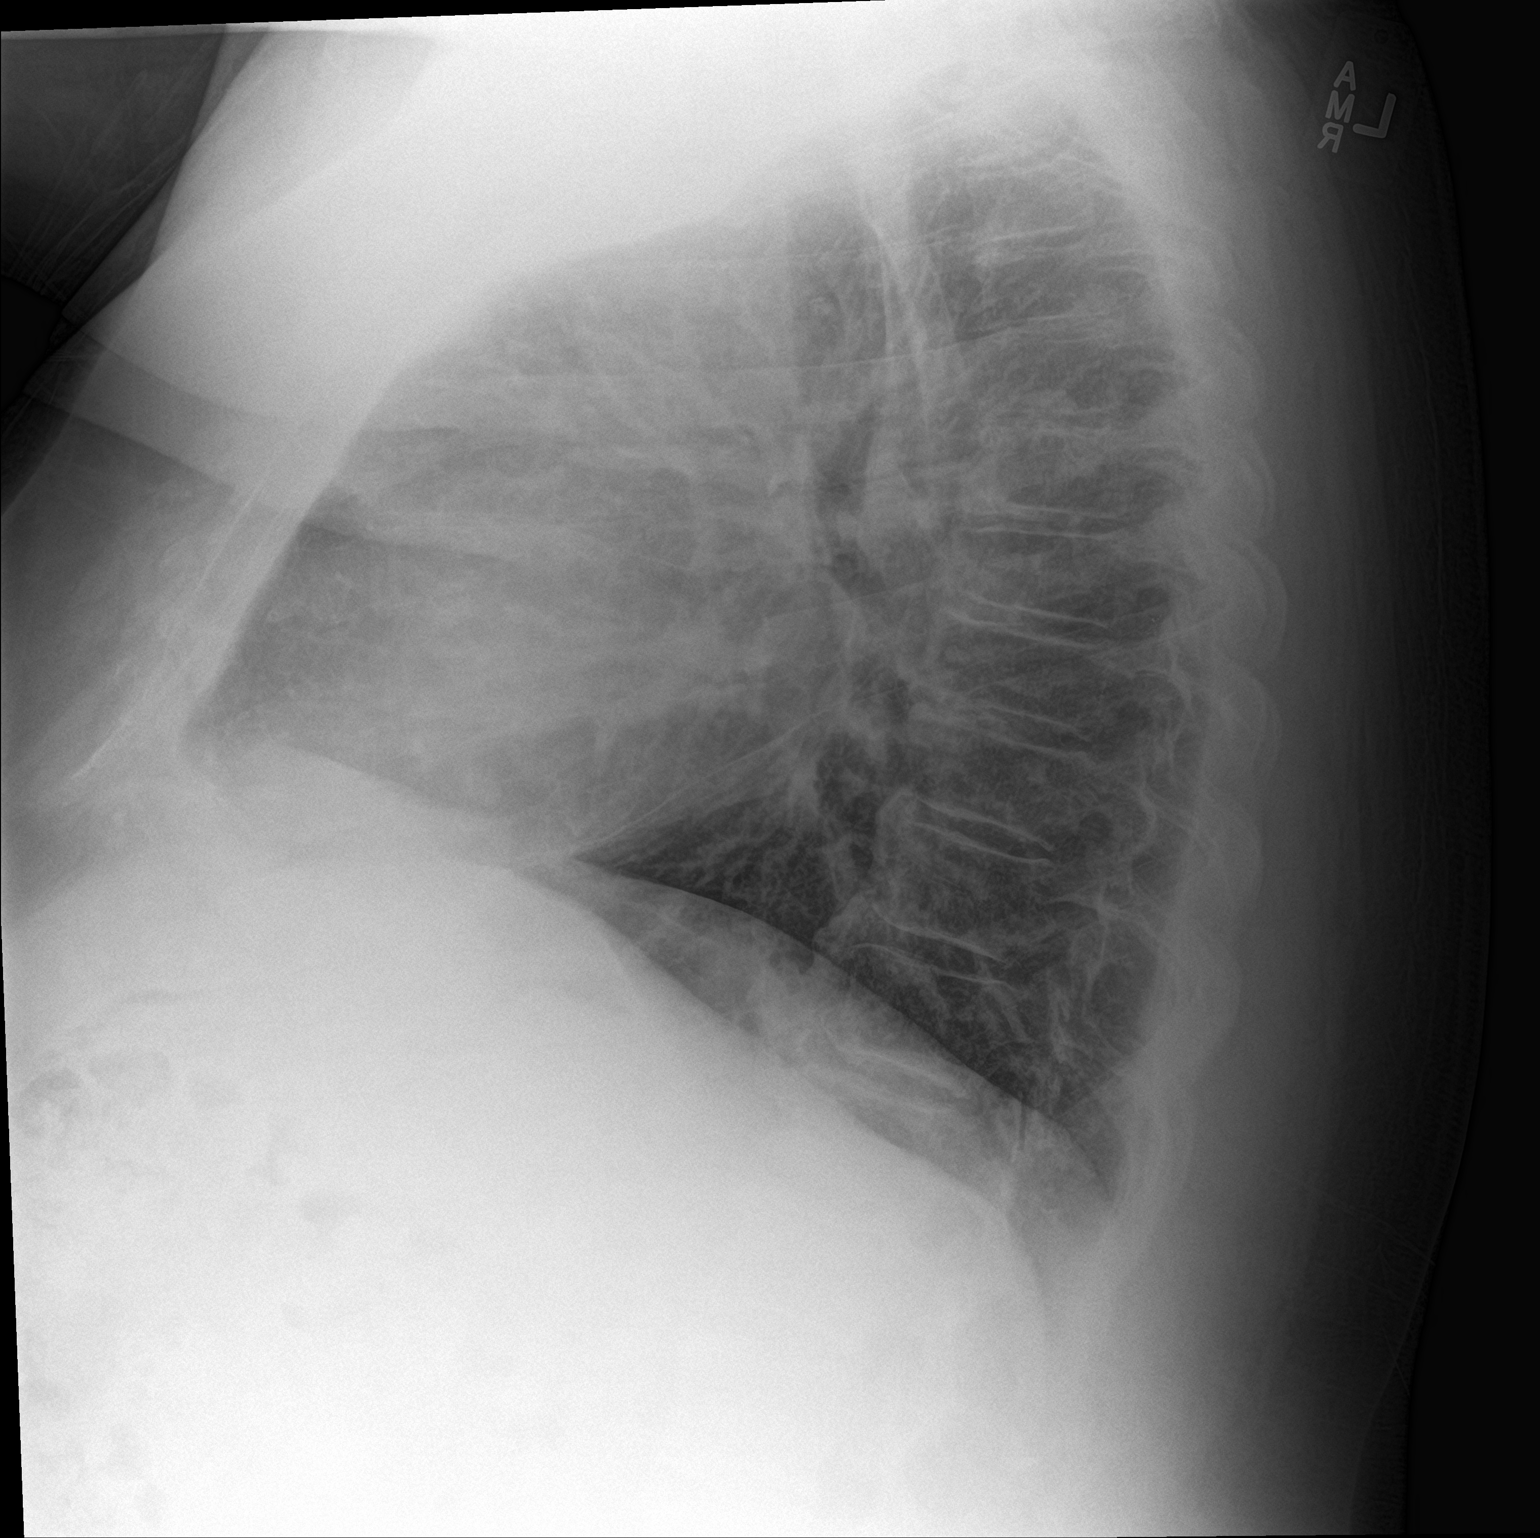

[2 of 2 positions shown; findings below may reference images not displayed]

FINDINGS: The heart size and mediastinal contours are within normal limits. No
pneumothorax or pleural effusion is noted. Increased bibasilar
densities are noted concerning for atelectasis or scarring. Old
right clavicular fracture is noted.
IMPRESSION: Mild bibasilar subsegmental atelectasis or scarring.

## 2018-10-27 ENCOUNTER — Ambulatory Visit (HOSPITAL_COMMUNITY): Payer: BC Managed Care – PPO | Attending: Pulmonary Disease

## 2018-10-27 ENCOUNTER — Other Ambulatory Visit: Payer: Self-pay

## 2018-10-27 ENCOUNTER — Encounter (HOSPITAL_COMMUNITY): Payer: Self-pay

## 2018-10-27 DIAGNOSIS — S91201A Unspecified open wound of right great toe with damage to nail, initial encounter: Secondary | ICD-10-CM | POA: Diagnosis present

## 2018-10-27 DIAGNOSIS — M79674 Pain in right toe(s): Secondary | ICD-10-CM

## 2018-10-27 NOTE — Therapy (Signed)
[place], [place], [address] Phone: [phone]   Fax:  [phone]  Wound Care Evaluation  Patient Details  Name: Kelly [name] MRN: [id number] Date of Birth: 26-Mar-1956 Referring Provider (PT): Kelly Du, MD   Encounter Date: 10/27/2018  PT End of Session - 10/27/18 1238    Visit Number  1    Number of Visits  8    Date for PT Re-Evaluation  11/24/18    Authorization Type  [employer] (20% co-ins, no auth, no visit limit)    Authorization Time Period  10/27/18-11/24/18    PT Start Time  0907    PT Stop Time  0950    PT Time Calculation (min)  43 min    Equipment Utilized During Treatment  Gait belt    Activity Tolerance  Patient tolerated treatment well    Behavior During Therapy  [facility] for tasks assessed/performed       Past Medical History:  Diagnosis Date  . Anxiety   . Arthritis   . Coronary atherosclerosis of native coronary artery    BMS LAD and PTCA RCA 2000,BMS RCA/PLA 2001, CABG June 2018  . Diverticulitis   . Essential hypertension   . Fatty liver   . Gastric ulcer   . GERD (gastroesophageal reflux disease)   . Hyperlipidemia   . Low-grade NHL (non-Hodgkin's lymphoma) 2007  . Lupus anticoagulant positive   . MI (myocardial infarction) ([place]) 2001  . Peripheral neuropathy   . Type 2 diabetes mellitus ([place])   . Umbilical hernia     Past Surgical History:  Procedure Laterality Date  . Axillary abscess     left incision and drainage left  . BACK SURGERY     multiple  . BIOPSY  08/15/2014   Procedure: BIOPSY;  Surgeon: Kelly Binder, MD;  [facility]: [facility];  Service: Endoscopy;;  Gastric  . BIOPSY  01/19/2015   Procedure: BIOPSY (GASTRIC ULCER);  Surgeon: Kelly Binder, MD;  Location: [facility];  Service: Endoscopy;;  . CARDIAC CATHETERIZATION    . cardiac stents     x5 stents  . COLONOSCOPY  June 2008   Dr. Dellis Filbert [name]: Mild left colonic diverticulosis  . CORONARY  ARTERY BYPASS GRAFT N/A 11/11/2016   Procedure: CORONARY ARTERY BYPASS GRAFTING (CABG) x 3, USING LEFT MAMMARY ARTERY AND RIGHT GREATER SAPHENOUS VEIN HARVESTED ENDOSCOPICALLY;  Surgeon: Kelly Poot, MD;  Location: [facility];  Service: Open Heart Surgery;  Laterality: N/A;  . Dental extractions    . ESOPHAGOGASTRODUODENOSCOPY  June 2008   Dr. Dellis Filbert [name]: Gastric ulcer, antral, biopsy with reactive changes associated glandular atrophy, no H pylori. No features of lymphoma. Likely NSAID related  . ESOPHAGOGASTRODUODENOSCOPY  August 2008   Dr. Dellis Filbert [name]: Complete healing of gastric ulcers.  . ESOPHAGOGASTRODUODENOSCOPY (EGD) WITH PROPOFOL N/A 08/15/2014   SLF: 1. Abnormal pain due to ulcers & gastriitis   . ESOPHAGOGASTRODUODENOSCOPY (EGD) WITH PROPOFOL N/A 01/19/2015   SLF: 1. Persistant ulcer with firm base 2. single gastric polyp found in the gastric antrum. Ulcer base   . EUS N/A 03/21/2015   Thickened gastric wall in pre-pyloric antrum, no obvious intramural mass, fatty pancreas, no obvious pancreatic mass.   . Inguinal lymph node biopsy     right inguinal lymph node biopsy  . INTRAVASCULAR PRESSURE WIRE/FFR STUDY N/A 09/26/2016   Procedure: Intravascular Pressure Wire/FFR Study;  Surgeon: Kelly Bush, MD;  Location: [facility];  Service: Cardiovascular;  Laterality: N/A;  . LEFT HEART CATH AND CORONARY ANGIOGRAPHY N/A 09/26/2016   Procedure: Left Heart Cath and Coronary Angiography;  Surgeon: Kelly Bush, MD;  Location: [facility];  Service: Cardiovascular;  Laterality: N/A;  . NECK SURGERY    . POLYPECTOMY  01/19/2015   Procedure: POLYPECTOMY (GASTRIC);  Surgeon: Kelly Binder, MD;  [facility];  Service: Endoscopy;;  . SHOULDER ARTHROSCOPY Left   . TEE WITHOUT CARDIOVERSION N/A 11/11/2016   Procedure: TRANSESOPHAGEAL ECHOCARDIOGRAM (TEE);  Surgeon: Kelly [name], Collier Salina, MD;  Location: [facility];  Service: Open Heart Surgery;  Laterality: N/A;    There were no  vitals filed for this visit.  Subjective Assessment - 10/27/18 1050    Subjective  [name] reports his Rt toe wound began ~ 1 month ago (chart review reveals 08/22/18 as start date). He states he was working at home and dropped a Arts development officer onto his Rt foot/great toe. He did not realize how bad the wound was until he took his shoe off and his toes was bleeding so much he had to throw out his shoe. At the ED they informed him his great toe was fractured and stitched his toe up with ~ 8 stiches. He reports he was on antibiotics for a couple weeks and then off. When he followed up with Dr. Luan [name] the first time he was put back on antibiotics. He saw him on 10/12/08 and had completed the antibiotics by then. he feels his wound is doing much better now that it was ~ 1 month ago. He states he has some pain in the toe that comes and goes.       [facility] PT Assessment - 10/27/18 0001      Assessment   Medical Diagnosis  Rt Great Toe Wound    Referring Provider (PT)  Kelly Du, MD    Onset Date/Surgical Date  08/22/18    Prior Therapy  Stitched in ED on 08/22/18, cleansed at home and antibiotics      Precautions   Precautions  None      Restrictions   Weight Bearing Restrictions  No      Balance Screen   Has the patient fallen in the past 6 months  No    Has the patient had a decrease in activity level because of a fear of falling?   No    Is the patient reluctant to leave their home because of a fear of falling?   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 reports his Rt toe wound began ~ 1 month ago (chart review reveals 08/22/18 as start date). He states he was working at home and dropped a Arts development officer onto his Rt foot/great toe. He did not realize how bad the wound was until he took his shoe off and  his toes was bleeding so much he had to throw out his shoe. At the ED they informed him his great toe was fractured and stitched his toe up with ~ 8 stiches. He reports he was on antibiotics for a couple weeks and then off. When he followed up with Dr. Luan [name] the first time he was put back on antibiotics. He saw him on 10/12/08 and had completed the antibiotics by then. he feels his wound is doing much better now that it was ~ 1 month ago. He states he has some pain in the toe that comes and goes.    Patient and Family Stated Goals  wound to heal    Date of Onset  08/22/18    Prior Treatments  antibiotics and self care    Pain Scale  0-10    Pain Score  6     Pain Type  Chronic pain    Pain Location  Toe (Comment which one)   Rt great toe   Pain Orientation  Distal    Pain Descriptors / Indicators  Burning    Pain Onset  On-going    Patients Stated Pain Goal  0    Pain Intervention(s)  Emotional support;Distraction    Multiple Pain Sites  No    Evaluation and Treatment Procedures Explained to Patient/Family  Yes    Evaluation and Treatment Procedures  agreed to    Wound Properties Date First Assessed: 10/27/18 Time First Assessed: 0920 Wound Type: Laceration Location: Toe (Comment  which one) , Rt Great Toe  Location Orientation: Right;Distal;Lateral Wound Description (Comments): inner phalangeal space on lateral Rt great toe Present on Admission: Yes   Dressing Type  Impregnated gauze (bismuth);Gauze (Comment)    Dressing Changed  Changed    Dressing Change Frequency  PRN    % Wound base Red or Granulating  80%    % Wound base Yellow/Fibrinous Exudate  20%    % Wound base  Black/Eschar  0%    % Wound base Other/Granulation Tissue (Comment)  0%    Peri-wound Assessment  Intact;Edema    Wound Length (cm)  1.2 cm    Wound Width (cm)  1 cm    Wound Depth (cm)  0.3 cm    Wound Volume (cm^3)  0.36 cm^3    Wound Surface Area (cm^2)  1.2 cm^2    Drainage Amount  Scant    Drainage Description  Serous    Treatment  Cleansed;Debridement (Selective)    Selective Debridement - Location  Rt great toe wound bed and margins    Selective Debridement - Tools Used  Forceps;Scissors    Selective Debridement - Tissue Removed  slough, devitalized tissue    Wound Therapy - Clinical Statement  Mr. Bishop presents to our clinic for evaluation of Rt great toe wound. Based on pt report and chart review Mr. Bachmann fractured his great toe on 08/22/18 and lost his toe nail and developed a wound after his toe was stitched up. He has been on 2 bouts of antibiotics and there are no signs of local infection around great toe or wound today. Wound bed is filled with slough initially that is easily cleansed and debrided this session. His nail bed is dry and there is no evidence of new nail growth at this time. Wound is minimally draining and was dressed with xeroform followed by 2x2 and gauze toe warp. He will benefit from skilled wound care to promote healing environment and reduce infection risk.  Wound Therapy - Functional Problem List  bathing(cannot get toe dressing wet)    Factors Delaying/Impairing Wound Healing  Altered sensation;Diabetes Mellitus;Multiple medical problems;Vascular compromise    Hydrotherapy Plan  Debridement;Dressing change;Patient/family education;Pulsatile lavage with suction    Wound Therapy - Frequency  2X / week    Wound Therapy - Current Recommendations  PT    Wound Plan  Measure on Mondays. Cleanse and debride as needed, dress with appropriate dressing and change topical agent as needed.    Dressing   xeroform, 2x2, gauze wrap, coban wrap       Objective  measurements completed on examination: See above findings.    PT Education - 10/27/18 1241    Education Details  Educated patient to keep dressing dry and in place. Educated on how to change dressing if if becomes saturated with drainage or soaked by mistake.    Person(s) Educated  Patient    Methods  Explanation    Comprehension  Verbalized understanding       PT Short Term Goals - 10/27/18 1249      PT SHORT TERM GOAL #1   Title  Patient's wound will be 100% red healthy tissue in wound bed to indicate good progress to proliferative stage of healing.    Time  2    Period  Weeks    Status  New    Target Date  11/10/18        PT Long Term Goals - 10/27/18 1249      PT LONG TERM GOAL #1   Title  Patient's wound will have reduced size by = or >75% area/volume indicating excellent progress with in proliferative healing and good approximation of wound.     Time  4    Period  Weeks    Status  New    Target Date  11/24/18      PT LONG TERM GOAL #2   Title  Patient will demonstrate proepr cleansing and dressing wound to transition to independent wound carea t home to complete healing remaining wound.     Time  4    Period  Weeks    Status  New    Target Date  11/24/18        Plan - 10/27/18 1245    Clinical Impression Statement  Mr. Runkle presents to our clinic for evaluation of Rt great toe wound. Based on pt report and chart review Mr. Flener fractured his great toe on 08/22/18 and lost his toe nail and developed a wound after his toe was stitched up. He has been on 2 bouts of antibiotics and there are no signs of local infection around great toe or wound today. Wound bed is filled with slough initially that is easily cleansed and debrided this session. His nail bed is dry and there is no evidence of new nail growth at this time. Wound is minimally draining and was dressed with xeroform followed by 2x2 and gauze toe warp. He will benefit from skilled wound care to promote healing  environment and reduce infection risk.    Personal Factors and Comorbidities  Age;Sex;Comorbidity 3+    Comorbidities  DM, PVD, CAD, history of malignant lymphoma    Examination-Activity Limitations  Bathing    Stability/Clinical Decision Making  Evolving/Moderate complexity    Clinical Decision Making  Low    Rehab Potential  Good    PT Frequency  2x / week    PT Duration  4 weeks    PT  Treatment/Interventions  ADLs/Self Care Home Management;Patient/family education;Passive range of motion;Taping;Other (comment);Manual techniques   wound care: debridement, cleansing, pulsed lavage   PT Next Visit Plan  Measure on Mondays. Cleanse and debride as needed, dress with appropriate dressing and change topical agent as needed.     Consulted and Agree with Plan of Care  Patient       Patient will benefit from skilled therapeutic intervention in order to improve the following deficits and impairments:  Decreased skin integrity, Impaired sensation, Pain  Visit Diagnosis: Open wound of right great toe with damage to nail, initial encounter  Pain of right great toe    Problem List Patient Active Problem List   Diagnosis Date Noted  . Dehiscence of external surgical wound 11/26/2016  . 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 Coronary artery disease 11/11/2016  . Accelerating angina ([place]) 09/26/2016  . Morbid obesity due to excess calories ([place]) 11/09/2015  . Colon cancer screening 12/28/2014  . Dyspepsia, NEW ONSET 07/26/2014  . Hepatic cirrhosis ([place]) 05/07/2014  . Low-grade NHL (non-Hodgkin's lymphoma) 05/16/2011  . Lupus anticoagulant positive 05/16/2011  . Gastric ulcer 05/16/2011  . Type 2 diabetes mellitus with vascular disease ([place]) 05/22/2009  . Mixed hyperlipidemia 05/22/2009  . Essential hypertension 05/22/2009  . CORONARY ATHEROSCLEROSIS NATIVE CORONARY ARTERY 05/22/2009    Kipp Brood, PT, DPT, [facility] Physical Therapist with [facility]  10/27/2018 12:59  PM    [place], [place], [address] Phone: [phone]   Fax:  [phone]  Name: Kelly [name] MRN: [id number] Date of Birth: 1955-06-15

## 2018-10-29 ENCOUNTER — Other Ambulatory Visit: Payer: Self-pay

## 2018-10-29 ENCOUNTER — Ambulatory Visit (HOSPITAL_COMMUNITY): Payer: BC Managed Care – PPO | Admitting: Physical Therapy

## 2018-10-29 DIAGNOSIS — S91201A Unspecified open wound of right great toe with damage to nail, initial encounter: Secondary | ICD-10-CM

## 2018-10-29 DIAGNOSIS — M79674 Pain in right toe(s): Secondary | ICD-10-CM

## 2018-10-29 NOTE — Therapy (Signed)
[place], [place], [address] Phone: [phone]   Fax:  [phone]  Wound Care Therapy  Patient Details  Name: Kelly [name] MRN: [id number] Date of Birth: 1955-05-29 Referring Provider (PT): Sinda Du, MD   Encounter Date: 10/29/2018  PT End of Session - 10/29/18 0930    Visit Number  2    Number of Visits  8    Date for PT Re-Evaluation  11/24/18    Authorization Type  [place] (20% co-ins, no auth, no visit limit)    Authorization Time Period  10/27/18-11/24/18    PT Start Time  0820    PT Stop Time  0845    PT Time Calculation (min)  25 min    Equipment Utilized During Treatment  Gait belt    Activity Tolerance  Patient tolerated treatment well    Behavior During Therapy  [facility] for tasks assessed/performed       Past Medical History:  Diagnosis Date  . Anxiety   . Arthritis   . Coronary atherosclerosis of native coronary artery    BMS LAD and PTCA RCA 2000,BMS RCA/PLA 2001, CABG June 2018  . Diverticulitis   . Essential hypertension   . Fatty liver   . Gastric ulcer   . GERD (gastroesophageal reflux disease)   . Hyperlipidemia   . Low-grade NHL (non-Hodgkin's lymphoma) 2007  . Lupus anticoagulant positive   . MI (myocardial infarction) ([place]) 2001  . Peripheral neuropathy   . Type 2 diabetes mellitus ([place])   . Umbilical hernia     Past Surgical History:  Procedure Laterality Date  . Axillary abscess     left incision and drainage left  . BACK SURGERY     multiple  . BIOPSY  08/15/2014   Procedure: BIOPSY;  Surgeon: Danie Binder, MD;  [facility];  Service: Endoscopy;;  Gastric  . BIOPSY  01/19/2015   Procedure: BIOPSY (GASTRIC ULCER);  Surgeon: Danie Binder, MD;  [facility];  Service: Endoscopy;;  . CARDIAC CATHETERIZATION    . cardiac stents     x5 stents  . COLONOSCOPY  June 2008   Dr. Dellis Filbert Medoff: Mild left colonic diverticulosis  . CORONARY ARTERY  BYPASS GRAFT N/A 11/11/2016   Procedure: CORONARY ARTERY BYPASS GRAFTING (CABG) x 3, USING LEFT MAMMARY ARTERY AND RIGHT GREATER SAPHENOUS VEIN HARVESTED ENDOSCOPICALLY;  Surgeon: Ivin Poot, MD;  Location: [facility];  Service: Open Heart Surgery;  Laterality: N/A;  . Dental extractions    . ESOPHAGOGASTRODUODENOSCOPY  June 2008   Dr. Dellis Filbert Medoff: Gastric ulcer, antral, biopsy with reactive changes associated glandular atrophy, no H pylori. No features of lymphoma. Likely NSAID related  . ESOPHAGOGASTRODUODENOSCOPY  August 2008   Dr. Dellis Filbert Medoff: Complete healing of gastric ulcers.  . ESOPHAGOGASTRODUODENOSCOPY (EGD) WITH PROPOFOL N/A 08/15/2014   SLF: 1. Abnormal pain due to ulcers & gastriitis   . ESOPHAGOGASTRODUODENOSCOPY (EGD) WITH PROPOFOL N/A 01/19/2015   SLF: 1. Persistant ulcer with firm base 2. single gastric polyp found in the gastric antrum. Ulcer base   . EUS N/A 03/21/2015   Thickened gastric wall in pre-pyloric antrum, no obvious intramural mass, fatty pancreas, no obvious pancreatic mass.   . Inguinal lymph node biopsy     right inguinal lymph node biopsy  . INTRAVASCULAR PRESSURE WIRE/FFR STUDY N/A 09/26/2016   Procedure: Intravascular Pressure Wire/FFR Study;  Surgeon: Nelva Bush, MD;  Location: [facility];  Service: Cardiovascular;  Laterality: N/A;  . LEFT HEART CATH AND CORONARY ANGIOGRAPHY N/A 09/26/2016   Procedure: Left Heart Cath and Coronary Angiography;  Surgeon: Nelva Bush, MD;  Location: [facility];  Service: Cardiovascular;  Laterality: N/A;  . NECK SURGERY    . POLYPECTOMY  01/19/2015   Procedure: POLYPECTOMY (GASTRIC);  Surgeon: Danie Binder, MD;  [facility];  Service: Endoscopy;;  . SHOULDER ARTHROSCOPY Left   . TEE WITHOUT CARDIOVERSION N/A 11/11/2016   Procedure: TRANSESOPHAGEAL ECHOCARDIOGRAM (TEE);  Surgeon: Prescott Gum, Collier Salina, MD;  Location: [facility];  Service: Open Heart Surgery;  Laterality: N/A;    There were no vitals  filed for this visit.              Wound Therapy - 10/29/18 0919    Subjective  [name] reports his Rt toe wound began ~ 1 month ago (chart review reveals 08/22/18 as start date). He states he was working at home and dropped a Arts development officer onto his Rt foot/great toe. He did not realize how bad the wound was until he took his shoe off and his toes was bleeding so much he had to throw out his shoe. At the ED they informed him his great toe was fractured and stitched his toe up with ~ 8 stiches. He reports he was on antibiotics for a couple weeks and then off. When he followed up with Dr. Luan Pulling the first time he was put back on antibiotics. He saw him on 10/12/08 and had completed the antibiotics by then. he feels his wound is doing much better now that it was ~ 1 month ago. He states he has some pain in the toe that comes and goes.    Patient and Family Stated Goals  wound to heal    Date of Onset  08/22/18    Prior Treatments  antibiotics and self care    Pain Scale  0-10    Pain Score  0-No pain    Evaluation and Treatment Procedures Explained to Patient/Family  Yes    Evaluation and Treatment Procedures  agreed to    Wound Properties Date First Assessed: 10/27/18 Time First Assessed: 0920 Wound Type: Laceration Location: Toe (Comment  which one) , Rt Great Toe  Location Orientation: Right;Distal;Lateral Wound Description (Comments): inner phalangeal space on lateral Rt great toe Present on Admission: Yes   Dressing Type  Impregnated gauze (bismuth);Gauze (Comment)    Dressing Changed  Changed    Dressing Change Frequency  PRN    % Wound base Red or Granulating  85%    % Wound base Yellow/Fibrinous Exudate  15%    % Wound base Black/Eschar  0%    % Wound base Other/Granulation Tissue (Comment)  0%    Peri-wound Assessment  Intact;Edema    Drainage Amount  Scant    Drainage Description  Serous    Treatment  Cleansed;Debridement (Selective)    Selective Debridement - Location  Rt  great toe wound bed and margins    Selective Debridement - Tools Used  Forceps;Scissors    Selective Debridement - Tissue Removed  slough, devitalized tissue    Wound Therapy - Clinical Statement  PT returns today with bandaid over his wound.  STates he really has no pain.  Able to remove edges to promote approximation.  Scraped sloth from inside wound.  Continued with xeroform and coban to secure.  pt reported overall comfort.      Wound Therapy - Functional  Problem List  bathing(cannot get toe dressing wet)    Factors Delaying/Impairing Wound Healing  Altered sensation;Diabetes Mellitus;Multiple medical problems;Vascular compromise    Hydrotherapy Plan  Debridement;Dressing change;Patient/family education;Pulsatile lavage with suction    Wound Therapy - Frequency  2X / week    Wound Therapy - Current Recommendations  PT    Wound Plan  continue with woundcare.  Measure weekly (mondays)    Dressing   xeroform, 2x2, gauze wrap, coban wrap                PT Short Term Goals - 10/27/18 1249      PT SHORT TERM GOAL #1   Title  Patient's wound will be 100% red healthy tissue in wound bed to indicate good progress to proliferative stage of healing.    Time  2    Period  Weeks    Status  New    Target Date  11/10/18        PT Long Term Goals - 10/27/18 1249      PT LONG TERM GOAL #1   Title  Patient's wound will have reduced size by = or >75% area/volume indicating excellent progress with in proliferative healing and good approximation of wound.     Time  4    Period  Weeks    Status  New    Target Date  11/24/18      PT LONG TERM GOAL #2   Title  Patient will demonstrate proepr cleansing and dressing wound to transition to independent wound carea t home to complete healing remaining wound.     Time  4    Period  Weeks    Status  New    Target Date  11/24/18              Patient will benefit from skilled therapeutic intervention in order to improve the following  deficits and impairments:     Visit Diagnosis: Open wound of right great toe with damage to nail, initial encounter  Pain of right great toe     Problem List Patient Active Problem List   Diagnosis Date Noted  . Dehiscence of external surgical wound 11/26/2016  . 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 Coronary artery disease 11/11/2016  . Accelerating angina ([place]) 09/26/2016  . Morbid obesity due to excess calories ([place]) 11/09/2015  . Colon cancer screening 12/28/2014  . Dyspepsia, NEW ONSET 07/26/2014  . Hepatic cirrhosis ([place]) 05/07/2014  . Low-grade NHL (non-Hodgkin's lymphoma) 05/16/2011  . Lupus anticoagulant positive 05/16/2011  . Gastric ulcer 05/16/2011  . Type 2 diabetes mellitus with vascular disease ([place]) 05/22/2009  . Mixed hyperlipidemia 05/22/2009  . Essential hypertension 05/22/2009  . CORONARY ATHEROSCLEROSIS NATIVE CORONARY ARTERY 05/22/2009   Teena Irani, PTA/CLT [phone]  Teena Irani 10/29/2018, 9:31 AM  [place] [address] [place], [place], [address] Phone: [phone]   Fax:  [phone]  Name: MACKENZIE LIA MRN: [id number] Date of Birth: [birth year]

## 2018-11-01 ENCOUNTER — Ambulatory Visit (HOSPITAL_COMMUNITY): Payer: BC Managed Care – PPO | Admitting: Physical Therapy

## 2018-11-01 ENCOUNTER — Other Ambulatory Visit: Payer: Self-pay

## 2018-11-01 DIAGNOSIS — S91201A Unspecified open wound of right great toe with damage to nail, initial encounter: Secondary | ICD-10-CM | POA: Diagnosis not present

## 2018-11-01 DIAGNOSIS — M79674 Pain in right toe(s): Secondary | ICD-10-CM

## 2018-11-01 NOTE — Therapy (Signed)
[place], [place], [address] Phone: [phone]   Fax:  [phone]  Wound Care Therapy  Patient Details  Name: Kelly [name] MRN: [id number] Date of Birth: Dec 07, 1955 Referring Provider (PT): Sinda Du, MD   Encounter Date: 11/01/2018    Past Medical History:  Diagnosis Date  . Anxiety   . Arthritis   . Coronary atherosclerosis of native coronary artery    BMS LAD and PTCA RCA 2000,BMS RCA/PLA 2001, CABG June 2018  . Diverticulitis   . Essential hypertension   . Fatty liver   . Gastric ulcer   . GERD (gastroesophageal reflux disease)   . Hyperlipidemia   . Low-grade NHL (non-Hodgkin's lymphoma) 2007  . Lupus anticoagulant positive   . MI (myocardial infarction) ([place]) 2001  . Peripheral neuropathy   . Type 2 diabetes mellitus ([place])   . Umbilical hernia     Past Surgical History:  Procedure Laterality Date  . Axillary abscess     left incision and drainage left  . BACK SURGERY     multiple  . BIOPSY  08/15/2014   Procedure: BIOPSY;  Surgeon: Danie Binder, MD;  [facility];  Service: Endoscopy;;  Gastric  . BIOPSY  01/19/2015   Procedure: BIOPSY (GASTRIC ULCER);  Surgeon: Danie Binder, MD;  [facility];  Service: Endoscopy;;  . CARDIAC CATHETERIZATION    . cardiac stents     x5 stents  . COLONOSCOPY  June 2008   Dr. Dellis Filbert Medoff: Mild left colonic diverticulosis  . CORONARY ARTERY BYPASS GRAFT N/A 11/11/2016   Procedure: CORONARY ARTERY BYPASS GRAFTING (CABG) x 3, USING LEFT MAMMARY ARTERY AND RIGHT GREATER SAPHENOUS VEIN HARVESTED ENDOSCOPICALLY;  Surgeon: Ivin Poot, MD;  Location: [facility];  Service: Open Heart Surgery;  Laterality: N/A;  . Dental extractions    . ESOPHAGOGASTRODUODENOSCOPY  June 2008   Dr. Dellis Filbert Medoff: Gastric ulcer, antral, biopsy with reactive changes associated glandular atrophy, no H pylori. No features of lymphoma. Likely NSAID related  .  ESOPHAGOGASTRODUODENOSCOPY  August 2008   Dr. Dellis Filbert Medoff: Complete healing of gastric ulcers.  . ESOPHAGOGASTRODUODENOSCOPY (EGD) WITH PROPOFOL N/A 08/15/2014   SLF: 1. Abnormal pain due to ulcers & gastriitis   . ESOPHAGOGASTRODUODENOSCOPY (EGD) WITH PROPOFOL N/A 01/19/2015   SLF: 1. Persistant ulcer with firm base 2. single gastric polyp found in the gastric antrum. Ulcer base   . EUS N/A 03/21/2015   Thickened gastric wall in pre-pyloric antrum, no obvious intramural mass, fatty pancreas, no obvious pancreatic mass.   . Inguinal lymph node biopsy     right inguinal lymph node biopsy  . INTRAVASCULAR PRESSURE WIRE/FFR STUDY N/A 09/26/2016   Procedure: Intravascular Pressure Wire/FFR Study;  Surgeon: Nelva Bush, MD;  Location: [facility];  Service: Cardiovascular;  Laterality: N/A;  . LEFT HEART CATH AND CORONARY ANGIOGRAPHY N/A 09/26/2016   Procedure: Left Heart Cath and Coronary Angiography;  Surgeon: Nelva Bush, MD;  Location: [facility];  Service: Cardiovascular;  Laterality: N/A;  . NECK SURGERY    . POLYPECTOMY  01/19/2015   Procedure: POLYPECTOMY (GASTRIC);  Surgeon: Danie Binder, MD;  [facility];  Service: Endoscopy;;  . SHOULDER ARTHROSCOPY Left   . TEE WITHOUT CARDIOVERSION N/A 11/11/2016   Procedure: TRANSESOPHAGEAL ECHOCARDIOGRAM (TEE);  Surgeon: Prescott Gum, Collier Salina, MD;  Location: [facility];  Service: Open Heart Surgery;  Laterality: N/A;    There were no vitals filed for  this visit.              Wound Therapy - 11/01/18 1658    Subjective  pt without issues, however reports he usually has to change it by the next day as it gets wet from showering.    Patient and Family Stated Goals  wound to heal    Date of Onset  08/22/18    Prior Treatments  antibiotics and self care    Pain Scale  0-10    Pain Score  0-No pain    Evaluation and Treatment Procedures Explained to Patient/Family  Yes    Evaluation and Treatment Procedures  agreed to     Wound Properties Date First Assessed: 10/27/18 Time First Assessed: 0920 Wound Type: Laceration Location: Toe (Comment  which one) , Rt Great Toe  Location Orientation: Right;Distal;Lateral Wound Description (Comments): inner phalangeal space on lateral Rt great toe Present on Admission: Yes   Dressing Type  Impregnated gauze (bismuth);Gauze (Comment)    Dressing Changed  Changed    Dressing Change Frequency  PRN    % Wound base Red or Granulating  85%    % Wound base Yellow/Fibrinous Exudate  15%    % Wound base Black/Eschar  0%    % Wound base Other/Granulation Tissue (Comment)  0%    Peri-wound Assessment  Intact;Edema    Drainage Amount  Scant    Drainage Description  Serous    Treatment  Cleansed;Debridement (Selective)    Selective Debridement - Location  Rt great toe wound bed and margins    Selective Debridement - Tools Used  Forceps;Scalpel    Selective Debridement - Tissue Removed  slough, devitalized tissue    Wound Therapy - Clinical Statement  wound macerated secondary to use of bandaid.  pt given name of "cast protector" to purchase and wear in shower to keep foot dry.  thick adherent slough perimeter of wound.  Able to scrape out using scapel revealing 90% granulation following.  Continued use of xeroform following application of vaseline to periemeter to reduce maceration.      Wound Therapy - Functional Problem List  bathing(cannot get toe dressing wet)    Factors Delaying/Impairing Wound Healing  Altered sensation;Diabetes Mellitus;Multiple medical problems;Vascular compromise    Hydrotherapy Plan  Debridement;Dressing change;Patient/family education;Pulsatile lavage with suction    Wound Therapy - Frequency  2X / week    Wound Therapy - Current Recommendations  PT    Wound Plan  continue with woundcare.  Measure weekly wednesday.     Dressing   xeroform, 2x2, gauze wrap, coban wrap                PT Short Term Goals - 10/27/18 1249      PT SHORT TERM GOAL #1    Title  Patient's wound will be 100% red healthy tissue in wound bed to indicate good progress to proliferative stage of healing.    Time  2    Period  Weeks    Status  New    Target Date  11/10/18        PT Long Term Goals - 10/27/18 1249      PT LONG TERM GOAL #1   Title  Patient's wound will have reduced size by = or >75% area/volume indicating excellent progress with in proliferative healing and good approximation of wound.     Time  4    Period  Weeks    Status  New    Target  Date  11/24/18      PT LONG TERM GOAL #2   Title  Patient will demonstrate proepr cleansing and dressing wound to transition to independent wound carea t home to complete healing remaining wound.     Time  4    Period  Weeks    Status  New    Target Date  11/24/18              Patient will benefit from skilled therapeutic intervention in order to improve the following deficits and impairments:     Visit Diagnosis: 1. Open wound of right great toe with damage to nail, initial encounter   2. Pain of right great toe        Problem List Patient Active Problem List   Diagnosis Date Noted  . Dehiscence of external surgical wound 11/26/2016  . 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 Coronary artery disease 11/11/2016  . Accelerating angina ([place]) 09/26/2016  . Morbid obesity due to excess calories ([place]) 11/09/2015  . Colon cancer screening 12/28/2014  . Dyspepsia, NEW ONSET 07/26/2014  . Hepatic cirrhosis ([place]) 05/07/2014  . Low-grade NHL (non-Hodgkin's lymphoma) 05/16/2011  . Lupus anticoagulant positive 05/16/2011  . Gastric ulcer 05/16/2011  . Type 2 diabetes mellitus with vascular disease ([place]) 05/22/2009  . Mixed hyperlipidemia 05/22/2009  . Essential hypertension 05/22/2009  . CORONARY ATHEROSCLEROSIS NATIVE CORONARY ARTERY 05/22/2009   Teena Irani, PTA/CLT [phone]  Teena Irani 11/01/2018, 5:06 PM  [place], [place], [address] Phone: [phone]   Fax:  [phone]  Name: Kelly [name] MRN: [id number] Date of Birth: 03-Oct-1955

## 2018-11-05 ENCOUNTER — Ambulatory Visit (HOSPITAL_COMMUNITY): Payer: BC Managed Care – PPO | Admitting: Physical Therapy

## 2018-11-05 ENCOUNTER — Other Ambulatory Visit: Payer: Self-pay

## 2018-11-05 DIAGNOSIS — S91201A Unspecified open wound of right great toe with damage to nail, initial encounter: Secondary | ICD-10-CM

## 2018-11-05 DIAGNOSIS — M79674 Pain in right toe(s): Secondary | ICD-10-CM

## 2018-11-05 NOTE — Therapy (Signed)
[place], [place], [address] Phone: [phone]   Fax:  [phone]  Wound Care Therapy  Patient Details  Name: Kelly [name] MRN: [id number] Date of Birth: 1955-07-04 Referring Provider (PT): Sinda Du, MD   Encounter Date: 11/05/2018  PT End of Session - 11/05/18 1527    Visit Number  3    Number of Visits  8    Date for PT Re-Evaluation  11/24/18    Authorization Type  [place] (20% co-ins, no auth, no visit limit)    Authorization Time Period  10/27/18-11/24/18    PT Start Time  1134    PT Stop Time  1200    PT Time Calculation (min)  26 min    Equipment Utilized During Treatment  Gait belt    Activity Tolerance  Patient tolerated treatment well    Behavior During Therapy  [facility] for tasks assessed/performed       Past Medical History:  Diagnosis Date  . Anxiety   . Arthritis   . Coronary atherosclerosis of native coronary artery    BMS LAD and PTCA RCA 2000,BMS RCA/PLA 2001, CABG June 2018  . Diverticulitis   . Essential hypertension   . Fatty liver   . Gastric ulcer   . GERD (gastroesophageal reflux disease)   . Hyperlipidemia   . Low-grade NHL (non-Hodgkin's lymphoma) 2007  . Lupus anticoagulant positive   . MI (myocardial infarction) ([place]) 2001  . Peripheral neuropathy   . Type 2 diabetes mellitus ([place])   . Umbilical hernia     Past Surgical History:  Procedure Laterality Date  . Axillary abscess     left incision and drainage left  . BACK SURGERY     multiple  . BIOPSY  08/15/2014   Procedure: BIOPSY;  Surgeon: Danie Binder, MD;  [facility];  Service: Endoscopy;;  Gastric  . BIOPSY  01/19/2015   Procedure: BIOPSY (GASTRIC ULCER);  Surgeon: Danie Binder, MD;  [facility];  Service: Endoscopy;;  . CARDIAC CATHETERIZATION    . cardiac stents     x5 stents  . COLONOSCOPY  June 2008   Dr. Dellis Filbert Medoff: Mild left colonic diverticulosis  . CORONARY ARTERY  BYPASS GRAFT N/A 11/11/2016   Procedure: CORONARY ARTERY BYPASS GRAFTING (CABG) x 3, USING LEFT MAMMARY ARTERY AND RIGHT GREATER SAPHENOUS VEIN HARVESTED ENDOSCOPICALLY;  Surgeon: Ivin Poot, MD;  Location: [facility];  Service: Open Heart Surgery;  Laterality: N/A;  . Dental extractions    . ESOPHAGOGASTRODUODENOSCOPY  June 2008   Dr. Dellis Filbert Medoff: Gastric ulcer, antral, biopsy with reactive changes associated glandular atrophy, no H pylori. No features of lymphoma. Likely NSAID related  . ESOPHAGOGASTRODUODENOSCOPY  August 2008   Dr. Dellis Filbert Medoff: Complete healing of gastric ulcers.  . ESOPHAGOGASTRODUODENOSCOPY (EGD) WITH PROPOFOL N/A 08/15/2014   SLF: 1. Abnormal pain due to ulcers & gastriitis   . ESOPHAGOGASTRODUODENOSCOPY (EGD) WITH PROPOFOL N/A 01/19/2015   SLF: 1. Persistant ulcer with firm base 2. single gastric polyp found in the gastric antrum. Ulcer base   . EUS N/A 03/21/2015   Thickened gastric wall in pre-pyloric antrum, no obvious intramural mass, fatty pancreas, no obvious pancreatic mass.   . Inguinal lymph node biopsy     right inguinal lymph node biopsy  . INTRAVASCULAR PRESSURE WIRE/FFR STUDY N/A 09/26/2016   Procedure: Intravascular Pressure Wire/FFR Study;  Surgeon: Nelva Bush, MD;  Location: [facility];  Service: Cardiovascular;  Laterality: N/A;  . LEFT HEART CATH AND CORONARY ANGIOGRAPHY N/A 09/26/2016   Procedure: Left Heart Cath and Coronary Angiography;  Surgeon: Nelva Bush, MD;  Location: [facility];  Service: Cardiovascular;  Laterality: N/A;  . NECK SURGERY    . POLYPECTOMY  01/19/2015   Procedure: POLYPECTOMY (GASTRIC);  Surgeon: Danie Binder, MD;  [facility];  Service: Endoscopy;;  . SHOULDER ARTHROSCOPY Left   . TEE WITHOUT CARDIOVERSION N/A 11/11/2016   Procedure: TRANSESOPHAGEAL ECHOCARDIOGRAM (TEE);  Surgeon: Prescott Gum, Collier Salina, MD;  Location: [facility];  Service: Open Heart Surgery;  Laterality: N/A;    There were no vitals  filed for this visit.              Wound Therapy - 11/05/18 1519    Subjective  pt without issues, however reports he usually has to change it by the next day as it gets wet from showering.    Patient and Family Stated Goals  wound to heal    Date of Onset  08/22/18    Prior Treatments  antibiotics and self care    Pain Scale  0-10    Pain Score  0-No pain    Evaluation and Treatment Procedures Explained to Patient/Family  Yes    Evaluation and Treatment Procedures  agreed to    Wound Properties Date First Assessed: 10/27/18 Time First Assessed: 0920 Wound Type: Laceration Location: Toe (Comment  which one) , Rt Great Toe  Location Orientation: Right;Distal;Lateral Wound Description (Comments): inner phalangeal space on lateral Rt great toe Present on Admission: Yes   Dressing Type  Impregnated gauze (bismuth);Gauze (Comment)    Dressing Changed  Changed    Dressing Change Frequency  PRN    % Wound base Red or Granulating  90%    % Wound base Yellow/Fibrinous Exudate  10%    % Wound base Black/Eschar  0%    % Wound base Other/Granulation Tissue (Comment)  0%    Peri-wound Assessment  Intact;Edema    Wound Length (cm)  1 cm   was 1.2 cm   Wound Width (cm)  0.3 cm   was 1 cm   Wound Depth (cm)  0.3 cm   was 0.3cm   Wound Volume (cm^3)  0.09 cm^3    Wound Surface Area (cm^2)  0.3 cm^2    Drainage Amount  Scant    Drainage Description  Serous    Treatment  Cleansed;Debridement (Selective)    Selective Debridement - Location  Rt great toe wound bed and margins    Selective Debridement - Tools Used  Forceps;Scalpel    Selective Debridement - Tissue Removed  slough, devitalized tissue    Wound Therapy - Clinical Statement  Pt able to keep dressing dry and intact.  No maceration this session.  Noted reduction in edema and measurements indicate reduced size.  Increased approximation noted.  overall doing well. Able to debride thick sheet of sloth with resultant granulation.   Xeroform used to keep adequate wound environment.  Gauze and coban applied.     Wound Therapy - Functional Problem List  bathing(cannot get toe dressing wet)    Factors Delaying/Impairing Wound Healing  Altered sensation;Diabetes Mellitus;Multiple medical problems;Vascular compromise    Hydrotherapy Plan  Debridement;Dressing change;Patient/family education;Pulsatile lavage with suction    Wound Therapy - Frequency  2X / week    Wound Therapy - Current Recommendations  PT    Wound Plan  continue with woundcare.  Measure  weekly wednesday.     Dressing   xeroform, 2x2, gauze wrap, coban wrap                PT Short Term Goals - 10/27/18 1249      PT SHORT TERM GOAL #1   Title  Patient's wound will be 100% red healthy tissue in wound bed to indicate good progress to proliferative stage of healing.    Time  2    Period  Weeks    Status  New    Target Date  11/10/18        PT Long Term Goals - 10/27/18 1249      PT LONG TERM GOAL #1   Title  Patient's wound will have reduced size by = or >75% area/volume indicating excellent progress with in proliferative healing and good approximation of wound.     Time  4    Period  Weeks    Status  New    Target Date  11/24/18      PT LONG TERM GOAL #2   Title  Patient will demonstrate proepr cleansing and dressing wound to transition to independent wound carea t home to complete healing remaining wound.     Time  4    Period  Weeks    Status  New    Target Date  11/24/18              Patient will benefit from skilled therapeutic intervention in order to improve the following deficits and impairments:     Visit Diagnosis: 1. Pain of right great toe   2. Open wound of right great toe with damage to nail, initial encounter        Problem List Patient Active Problem List   Diagnosis Date Noted  . Dehiscence of external surgical wound 11/26/2016  . 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 Coronary artery disease 11/11/2016  .  Accelerating angina ([place]) 09/26/2016  . Morbid obesity due to excess calories ([place]) 11/09/2015  . Colon cancer screening 12/28/2014  . Dyspepsia, NEW ONSET 07/26/2014  . Hepatic cirrhosis ([place]) 05/07/2014  . Low-grade NHL (non-Hodgkin's lymphoma) 05/16/2011  . Lupus anticoagulant positive 05/16/2011  . Gastric ulcer 05/16/2011  . Type 2 diabetes mellitus with vascular disease ([place]) 05/22/2009  . Mixed hyperlipidemia 05/22/2009  . Essential hypertension 05/22/2009  . CORONARY ATHEROSCLEROSIS NATIVE CORONARY ARTERY 05/22/2009   Teena Irani, PTA/CLT [phone]  Teena Irani 11/05/2018, 3:28 PM  [place], [place], [address] Phone: [phone]   Fax:  [phone]  Name: Kelly [name] MRN: [id number] Date of Birth: [birth year]

## 2018-11-08 ENCOUNTER — Ambulatory Visit (HOSPITAL_COMMUNITY): Payer: BC Managed Care – PPO | Admitting: Physical Therapy

## 2018-11-08 ENCOUNTER — Other Ambulatory Visit: Payer: Self-pay

## 2018-11-08 DIAGNOSIS — S91201A Unspecified open wound of right great toe with damage to nail, initial encounter: Secondary | ICD-10-CM

## 2018-11-08 DIAGNOSIS — M79674 Pain in right toe(s): Secondary | ICD-10-CM

## 2018-11-08 NOTE — Therapy (Signed)
[place], [place], [address] Phone: [phone]   Fax:  [phone]  Wound Care Therapy  Patient Details  Name: Kelly [name] MRN: [id number] Date of Birth: 1955-08-20 Referring Provider (PT): Sinda Du, MD   Encounter Date: 11/08/2018  PT End of Session - 11/08/18 1400    Visit Number  4    Number of Visits  8    Date for PT Re-Evaluation  11/24/18    Authorization Type  [place] (20% co-ins, no auth, no visit limit)    Authorization Time Period  10/27/18-11/24/18    PT Start Time  1130    PT Stop Time  1155    PT Time Calculation (min)  25 min    Equipment Utilized During Treatment  Gait belt    Activity Tolerance  Patient tolerated treatment well    Behavior During Therapy  [facility] for tasks assessed/performed       Past Medical History:  Diagnosis Date  . Anxiety   . Arthritis   . Coronary atherosclerosis of native coronary artery    BMS LAD and PTCA RCA 2000,BMS RCA/PLA 2001, CABG June 2018  . Diverticulitis   . Essential hypertension   . Fatty liver   . Gastric ulcer   . GERD (gastroesophageal reflux disease)   . Hyperlipidemia   . Low-grade NHL (non-Hodgkin's lymphoma) 2007  . Lupus anticoagulant positive   . MI (myocardial infarction) ([place]) 2001  . Peripheral neuropathy   . Type 2 diabetes mellitus ([place])   . Umbilical hernia     Past Surgical History:  Procedure Laterality Date  . Axillary abscess     left incision and drainage left  . BACK SURGERY     multiple  . BIOPSY  08/15/2014   Procedure: BIOPSY;  Surgeon: Danie Binder, MD;  [facility];  Service: Endoscopy;;  Gastric  . BIOPSY  01/19/2015   Procedure: BIOPSY (GASTRIC ULCER);  Surgeon: Danie Binder, MD;  [facility];  Service: Endoscopy;;  . CARDIAC CATHETERIZATION    . cardiac stents     x5 stents  . COLONOSCOPY  June 2008   Dr. Dellis Filbert Medoff: Mild left colonic diverticulosis  . CORONARY ARTERY  BYPASS GRAFT N/A 11/11/2016   Procedure: CORONARY ARTERY BYPASS GRAFTING (CABG) x 3, USING LEFT MAMMARY ARTERY AND RIGHT GREATER SAPHENOUS VEIN HARVESTED ENDOSCOPICALLY;  Surgeon: Ivin Poot, MD;  Location: [facility];  Service: Open Heart Surgery;  Laterality: N/A;  . Dental extractions    . ESOPHAGOGASTRODUODENOSCOPY  June 2008   Dr. Dellis Filbert Medoff: Gastric ulcer, antral, biopsy with reactive changes associated glandular atrophy, no H pylori. No features of lymphoma. Likely NSAID related  . ESOPHAGOGASTRODUODENOSCOPY  August 2008   Dr. Dellis Filbert Medoff: Complete healing of gastric ulcers.  . ESOPHAGOGASTRODUODENOSCOPY (EGD) WITH PROPOFOL N/A 08/15/2014   SLF: 1. Abnormal pain due to ulcers & gastriitis   . ESOPHAGOGASTRODUODENOSCOPY (EGD) WITH PROPOFOL N/A 01/19/2015   SLF: 1. Persistant ulcer with firm base 2. single gastric polyp found in the gastric antrum. Ulcer base   . EUS N/A 03/21/2015   Thickened gastric wall in pre-pyloric antrum, no obvious intramural mass, fatty pancreas, no obvious pancreatic mass.   . Inguinal lymph node biopsy     right inguinal lymph node biopsy  . INTRAVASCULAR PRESSURE WIRE/FFR STUDY N/A 09/26/2016   Procedure: Intravascular Pressure Wire/FFR Study;  Surgeon: Nelva Bush, MD;  Location: [facility];  Service: Cardiovascular;  Laterality: N/A;  . LEFT HEART CATH AND CORONARY ANGIOGRAPHY N/A 09/26/2016   Procedure: Left Heart Cath and Coronary Angiography;  Surgeon: Nelva Bush, MD;  Location: [facility];  Service: Cardiovascular;  Laterality: N/A;  . NECK SURGERY    . POLYPECTOMY  01/19/2015   Procedure: POLYPECTOMY (GASTRIC);  Surgeon: Danie Binder, MD;  [facility];  Service: Endoscopy;;  . SHOULDER ARTHROSCOPY Left   . TEE WITHOUT CARDIOVERSION N/A 11/11/2016   Procedure: TRANSESOPHAGEAL ECHOCARDIOGRAM (TEE);  Surgeon: Prescott Gum, Collier Salina, MD;  Location: [facility];  Service: Open Heart Surgery;  Laterality: N/A;    There were no vitals  filed for this visit.   Subjective Assessment - 11/08/18 1357    Subjective  pt without issues today.  Dressing remained intact.                Wound Therapy - 11/08/18 1357    Subjective  no issues today.  dressing remains intact.    Patient and Family Stated Goals  wound to heal    Date of Onset  08/22/18    Prior Treatments  antibiotics and self care    Evaluation and Treatment Procedures Explained to Patient/Family  Yes    Evaluation and Treatment Procedures  agreed to    Wound Properties Date First Assessed: 10/27/18 Time First Assessed: 0920 Wound Type: Laceration Location: Toe (Comment  which one) , Rt Great Toe  Location Orientation: Right;Distal;Lateral Wound Description (Comments): inner phalangeal space on lateral Rt great toe Present on Admission: Yes   Dressing Type  Impregnated gauze (bismuth);Gauze (Comment)    Dressing Changed  Changed    Dressing Change Frequency  PRN    % Wound base Red or Granulating  95%    % Wound base Yellow/Fibrinous Exudate  5%    % Wound base Black/Eschar  0%    % Wound base Other/Granulation Tissue (Comment)  0%    Peri-wound Assessment  Intact;Edema    Drainage Amount  Scant    Drainage Description  Serous    Treatment  Cleansed;Debridement (Selective)    Selective Debridement - Location  Rt great toe wound bed and margins    Selective Debridement - Tools Used  Forceps;Scalpel    Selective Debridement - Tissue Removed  slough, devitalized tissue    Wound Therapy - Clinical Statement  continued improvement.  NO maceration noted and less slough to rebride.  Cleansed well and continued with xeroform dressings.      Wound Therapy - Functional Problem List  bathing(cannot get toe dressing wet)    Factors Delaying/Impairing Wound Healing  Altered sensation;Diabetes Mellitus;Multiple medical problems;Vascular compromise    Hydrotherapy Plan  Debridement;Dressing change;Patient/family education;Pulsatile lavage with suction    Wound  Therapy - Frequency  2X / week    Wound Therapy - Current Recommendations  PT    Wound Plan  continue with woundcare.  Measure weekly wednesday.     Dressing   xeroform, 2x2, gauze wrap, coban wrap                PT Short Term Goals - 10/27/18 1249      PT SHORT TERM GOAL #1   Title  Patient's wound will be 100% red healthy tissue in wound bed to indicate good progress to proliferative stage of healing.    Time  2    Period  Weeks    Status  New    Target Date  11/10/18  PT Long Term Goals - 10/27/18 1249      PT LONG TERM GOAL #1   Title  Patient's wound will have reduced size by = or >75% area/volume indicating excellent progress with in proliferative healing and good approximation of wound.     Time  4    Period  Weeks    Status  New    Target Date  11/24/18      PT LONG TERM GOAL #2   Title  Patient will demonstrate proepr cleansing and dressing wound to transition to independent wound carea t home to complete healing remaining wound.     Time  4    Period  Weeks    Status  New    Target Date  11/24/18              Patient will benefit from skilled therapeutic intervention in order to improve the following deficits and impairments:     Visit Diagnosis: 1. Pain of right great toe   2. Open wound of right great toe with damage to nail, initial encounter        Problem List Patient Active Problem List   Diagnosis Date Noted  . Dehiscence of external surgical wound 11/26/2016  . 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 Coronary artery disease 11/11/2016  . Accelerating angina ([place]) 09/26/2016  . Morbid obesity due to excess calories ([place]) 11/09/2015  . Colon cancer screening 12/28/2014  . Dyspepsia, NEW ONSET 07/26/2014  . Hepatic cirrhosis ([place]) 05/07/2014  . Low-grade NHL (non-Hodgkin's lymphoma) 05/16/2011  . Lupus anticoagulant positive 05/16/2011  . Gastric ulcer 05/16/2011  . Type 2 diabetes mellitus with vascular disease ([place]) 05/22/2009  .  Mixed hyperlipidemia 05/22/2009  . Essential hypertension 05/22/2009  . CORONARY ATHEROSCLEROSIS NATIVE CORONARY ARTERY 05/22/2009   Teena Irani, PTA/CLT [phone]  Teena Irani 11/08/2018, 2:01 PM  [place], [place], [address] Phone: [phone]   Fax:  [phone]  Name: Kelly [name] MRN: [id number] Date of Birth: [birth year]

## 2018-11-11 ENCOUNTER — Other Ambulatory Visit: Payer: Self-pay

## 2018-11-11 ENCOUNTER — Encounter (HOSPITAL_COMMUNITY): Payer: Self-pay

## 2018-11-11 ENCOUNTER — Ambulatory Visit (HOSPITAL_COMMUNITY): Payer: BC Managed Care – PPO

## 2018-11-11 DIAGNOSIS — S91201A Unspecified open wound of right great toe with damage to nail, initial encounter: Secondary | ICD-10-CM | POA: Diagnosis not present

## 2018-11-11 DIAGNOSIS — M79674 Pain in right toe(s): Secondary | ICD-10-CM

## 2018-11-11 NOTE — Therapy (Signed)
[place], [place], [address] Phone: [phone]   Fax:  [phone]  Wound Care Therapy  Patient Details  Name: Kelly [name] MRN: [id number] Date of Birth: 23-Sep-1955 Referring Provider (PT): Sinda Du, MD   Encounter Date: 11/11/2018  PT End of Session - 11/11/18 1128    Visit Number  5    Number of Visits  8    Date for PT Re-Evaluation  11/24/18    Authorization Type  [place] (20% co-ins, no auth, no visit limit)    Authorization Time Period  10/27/18-11/24/18    PT Start Time  1101    PT Stop Time  1120    PT Time Calculation (min)  19 min    Activity Tolerance  Patient tolerated treatment well    Behavior During Therapy  [facility] for tasks assessed/performed       Past Medical History:  Diagnosis Date  . Anxiety   . Arthritis   . Coronary atherosclerosis of native coronary artery    BMS LAD and PTCA RCA 2000,BMS RCA/PLA 2001, CABG June 2018  . Diverticulitis   . Essential hypertension   . Fatty liver   . Gastric ulcer   . GERD (gastroesophageal reflux disease)   . Hyperlipidemia   . Low-grade NHL (non-Hodgkin's lymphoma) 2007  . Lupus anticoagulant positive   . MI (myocardial infarction) ([place]) 2001  . Peripheral neuropathy   . Type 2 diabetes mellitus ([place])   . Umbilical hernia     Past Surgical History:  Procedure Laterality Date  . Axillary abscess     left incision and drainage left  . BACK SURGERY     multiple  . BIOPSY  08/15/2014   Procedure: BIOPSY;  Surgeon: Danie Binder, MD;  Location: [facility];  Service: Endoscopy;;  Gastric  . BIOPSY  01/19/2015   Procedure: BIOPSY (GASTRIC ULCER);  Surgeon: Danie Binder, MD;  Location: [facility];  Service: Endoscopy;;  . CARDIAC CATHETERIZATION    . cardiac stents     x5 stents  . COLONOSCOPY  June 2008   Dr. Dellis Filbert Medoff: Mild left colonic diverticulosis  . CORONARY ARTERY BYPASS GRAFT N/A 11/11/2016   Procedure: CORONARY  ARTERY BYPASS GRAFTING (CABG) x 3, USING LEFT MAMMARY ARTERY AND RIGHT GREATER SAPHENOUS VEIN HARVESTED ENDOSCOPICALLY;  Surgeon: Ivin Poot, MD;  Location: [place];  Service: Open Heart Surgery;  Laterality: N/A;  . Dental extractions    . ESOPHAGOGASTRODUODENOSCOPY  June 2008   Dr. Dellis Filbert Medoff: Gastric ulcer, antral, biopsy with reactive changes associated glandular atrophy, no H pylori. No features of lymphoma. Likely NSAID related  . ESOPHAGOGASTRODUODENOSCOPY  August 2008   Dr. Dellis Filbert Medoff: Complete healing of gastric ulcers.  . ESOPHAGOGASTRODUODENOSCOPY (EGD) WITH PROPOFOL N/A 08/15/2014   SLF: 1. Abnormal pain due to ulcers & gastriitis   . ESOPHAGOGASTRODUODENOSCOPY (EGD) WITH PROPOFOL N/A 01/19/2015   SLF: 1. Persistant ulcer with firm base 2. single gastric polyp found in the gastric antrum. Ulcer base   . EUS N/A 03/21/2015   Thickened gastric wall in pre-pyloric antrum, no obvious intramural mass, fatty pancreas, no obvious pancreatic mass.   . Inguinal lymph node biopsy     right inguinal lymph node biopsy  . INTRAVASCULAR PRESSURE WIRE/FFR STUDY N/A 09/26/2016   Procedure: Intravascular Pressure Wire/FFR Study;  Surgeon: Nelva Bush, MD;  Location: [place] [facility];  Service: Cardiovascular;  Laterality: N/A;  . LEFT HEART  CATH AND CORONARY ANGIOGRAPHY N/A 09/26/2016   Procedure: Left Heart Cath and Coronary Angiography;  Surgeon: Nelva Bush, MD;  Location: [place] [facility];  Service: Cardiovascular;  Laterality: N/A;  . NECK SURGERY    . POLYPECTOMY  01/19/2015   Procedure: POLYPECTOMY (GASTRIC);  Surgeon: Danie Binder, MD;  [facility];  Service: Endoscopy;;  . SHOULDER ARTHROSCOPY Left   . TEE WITHOUT CARDIOVERSION N/A 11/11/2016   Procedure: TRANSESOPHAGEAL ECHOCARDIOGRAM (TEE);  Surgeon: Prescott Gum, Collier Salina, MD;  Location: [place];  Service: Open Heart Surgery;  Laterality: N/A;    There were no vitals filed for this visit.   Subjective Assessment -  11/11/18 1050    Subjective  Pt arrived with dressing intact, no reoprts of pain                Wound Therapy - 11/11/18 1125    Subjective  Pt arrived with dressing intact, no reoprts of pain    Patient and Family Stated Goals  wound to heal    Date of Onset  08/22/18    Prior Treatments  antibiotics and self care    Pain Scale  0-10    Pain Score  0-No pain    Evaluation and Treatment Procedures Explained to Patient/Family  Yes    Evaluation and Treatment Procedures  agreed to    Wound Properties Date First Assessed: 10/27/18 Time First Assessed: 0920 Wound Type: Laceration Location: Toe (Comment  which one) , Rt Great Toe  Location Orientation: Right;Distal;Lateral Wound Description (Comments): inner phalangeal space on lateral Rt great toe Present on Admission: Yes   Dressing Type  Impregnated gauze (bismuth);Gauze (Comment)    Dressing Changed  Changed    Dressing Change Frequency  PRN    % Wound base Red or Granulating  95%    % Wound base Yellow/Fibrinous Exudate  5%    % Wound base Black/Eschar  0%    Peri-wound Assessment  Intact;Edema    Wound Length (cm)  0.4 cm    Wound Width (cm)  0.2 cm    Wound Depth (cm)  0.3 cm    Wound Volume (cm^3)  0.02 cm^3    Wound Surface Area (cm^2)  0.08 cm^2    Drainage Amount  Scant    Drainage Description  Serous    Treatment  Cleansed;Debridement (Selective)    Selective Debridement - Location  Rt great toe wound bed and margins    Selective Debridement - Tools Used  Forceps;Scalpel    Selective Debridement - Tissue Removed  slough, devitalized tissue    Wound Therapy - Clinical Statement  Continues to progress towards healing with minimal slough and decrease in overall size.  Continued with xeroform, gauze and coban for edema control.  No reports of pain through session.      Wound Therapy - Functional Problem List  bathing(cannot get toe dressing wet)    Factors Delaying/Impairing Wound Healing  Altered sensation;Diabetes  Mellitus;Multiple medical problems;Vascular compromise    Hydrotherapy Plan  Debridement;Dressing change;Patient/family education;Pulsatile lavage with suction    Wound Therapy - Frequency  2X / week    Wound Therapy - Current Recommendations  PT    Wound Plan  continue with woundcare.  Measure weekly wednesday.     Dressing   xeroform, 2x2, gauze wrap, coban wrap                PT Short Term Goals - 10/27/18 1249      PT  SHORT TERM GOAL #1   Title  Patient's wound will be 100% red healthy tissue in wound bed to indicate good progress to proliferative stage of healing.    Time  2    Period  Weeks    Status  New    Target Date  11/10/18        PT Long Term Goals - 10/27/18 1249      PT LONG TERM GOAL #1   Title  Patient's wound will have reduced size by = or >75% area/volume indicating excellent progress with in proliferative healing and good approximation of wound.     Time  4    Period  Weeks    Status  New    Target Date  11/24/18      PT LONG TERM GOAL #2   Title  Patient will demonstrate proepr cleansing and dressing wound to transition to independent wound carea t home to complete healing remaining wound.     Time  4    Period  Weeks    Status  New    Target Date  11/24/18              Patient will benefit from skilled therapeutic intervention in order to improve the following deficits and impairments:     Visit Diagnosis: 1. Open wound of right great toe with damage to nail, initial encounter   2. Pain of right great toe        Problem List Patient Active Problem List   Diagnosis Date Noted  . Dehiscence of external surgical wound 11/26/2016  . 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 Coronary artery disease 11/11/2016  . Accelerating angina ([place]) 09/26/2016  . Morbid obesity due to excess calories ([place]) 11/09/2015  . Colon cancer screening 12/28/2014  . Dyspepsia, NEW ONSET 07/26/2014  . Hepatic cirrhosis ([place]) 05/07/2014  . Low-grade NHL  (non-Hodgkin's lymphoma) 05/16/2011  . Lupus anticoagulant positive 05/16/2011  . Gastric ulcer 05/16/2011  . Type 2 diabetes mellitus with vascular disease ([place]) 05/22/2009  . Mixed hyperlipidemia 05/22/2009  . Essential hypertension 05/22/2009  . CORONARY ATHEROSCLEROSIS NATIVE CORONARY ARTERY 05/22/2009   Ihor Austin, [place]; Why  Aldona Lento 11/11/2018, 11:29 AM  [place], [place], [address] Phone: [phone]   Fax:  [phone]  Name: Kelly [name] MRN: [id number] Date of Birth: [birth year]

## 2018-11-12 ENCOUNTER — Ambulatory Visit (HOSPITAL_COMMUNITY): Payer: BC Managed Care – PPO

## 2018-11-15 ENCOUNTER — Other Ambulatory Visit: Payer: Self-pay

## 2018-11-15 ENCOUNTER — Ambulatory Visit (HOSPITAL_COMMUNITY): Payer: BC Managed Care – PPO | Admitting: Physical Therapy

## 2018-11-15 DIAGNOSIS — S91201A Unspecified open wound of right great toe with damage to nail, initial encounter: Secondary | ICD-10-CM

## 2018-11-15 DIAGNOSIS — M79674 Pain in right toe(s): Secondary | ICD-10-CM

## 2018-11-15 NOTE — Therapy (Signed)
[place] [address], [place], [address] Phone: [phone]   Fax:  [phone]  Wound Care Therapy  Patient Details  Name: Kelly [name] MRN: [id number] Date of Birth: [birth year] Referring Provider (PT): Sinda Du, MD   Encounter Date: 11/15/2018    Past Medical History:  Diagnosis Date  . Anxiety   . Arthritis   . Coronary atherosclerosis of native coronary artery    BMS LAD and PTCA RCA 2000,BMS RCA/PLA 2001, CABG June 2018  . Diverticulitis   . Essential hypertension   . Fatty liver   . Gastric ulcer   . GERD (gastroesophageal reflux disease)   . Hyperlipidemia   . Low-grade NHL (non-Hodgkin's lymphoma) 2007  . Lupus anticoagulant positive   . MI (myocardial infarction) ([place]) 2001  . Peripheral neuropathy   . Type 2 diabetes mellitus ([place])   . Umbilical hernia     Past Surgical History:  Procedure Laterality Date  . Axillary abscess     left incision and drainage left  . BACK SURGERY     multiple  . BIOPSY  08/15/2014   Procedure: BIOPSY;  Surgeon: Danie Binder, MD;  [facility];  Service: Endoscopy;;  Gastric  . BIOPSY  01/19/2015   Procedure: BIOPSY (GASTRIC ULCER);  Surgeon: Danie Binder, MD;  [facility];  Service: Endoscopy;;  . CARDIAC CATHETERIZATION    . cardiac stents     x5 stents  . COLONOSCOPY  June 2008   Dr. Dellis Filbert Medoff: Mild left colonic diverticulosis  . CORONARY ARTERY BYPASS GRAFT N/A 11/11/2016   Procedure: CORONARY ARTERY BYPASS GRAFTING (CABG) x 3, USING LEFT MAMMARY ARTERY AND RIGHT GREATER SAPHENOUS VEIN HARVESTED ENDOSCOPICALLY;  Surgeon: Ivin Poot, MD;  Location: [place];  Service: Open Heart Surgery;  Laterality: N/A;  . Dental extractions    . ESOPHAGOGASTRODUODENOSCOPY  June 2008   Dr. Dellis Filbert Medoff: Gastric ulcer, antral, biopsy with reactive changes associated glandular atrophy, no H pylori. No features of lymphoma. Likely NSAID related  .  ESOPHAGOGASTRODUODENOSCOPY  August 2008   Dr. Dellis Filbert Medoff: Complete healing of gastric ulcers.  . ESOPHAGOGASTRODUODENOSCOPY (EGD) WITH PROPOFOL N/A 08/15/2014   SLF: 1. Abnormal pain due to ulcers & gastriitis   . ESOPHAGOGASTRODUODENOSCOPY (EGD) WITH PROPOFOL N/A 01/19/2015   SLF: 1. Persistant ulcer with firm base 2. single gastric polyp found in the gastric antrum. Ulcer base   . EUS N/A 03/21/2015   Thickened gastric wall in pre-pyloric antrum, no obvious intramural mass, fatty pancreas, no obvious pancreatic mass.   . Inguinal lymph node biopsy     right inguinal lymph node biopsy  . INTRAVASCULAR PRESSURE WIRE/FFR STUDY N/A 09/26/2016   Procedure: Intravascular Pressure Wire/FFR Study;  Surgeon: Nelva Bush, MD;  Location: [place] [facility];  Service: Cardiovascular;  Laterality: N/A;  . LEFT HEART CATH AND CORONARY ANGIOGRAPHY N/A 09/26/2016   Procedure: Left Heart Cath and Coronary Angiography;  Surgeon: Nelva Bush, MD;  Location: [facility] [facility];  Service: Cardiovascular;  Laterality: N/A;  . NECK SURGERY    . POLYPECTOMY  01/19/2015   Procedure: POLYPECTOMY (GASTRIC);  Surgeon: Danie Binder, MD;  [facility];  Service: Endoscopy;;  . SHOULDER ARTHROSCOPY Left   . TEE WITHOUT CARDIOVERSION N/A 11/11/2016   Procedure: TRANSESOPHAGEAL ECHOCARDIOGRAM (TEE);  Surgeon: Prescott Gum, Collier Salina, MD;  Location: [place];  Service: Open Heart Surgery;  Laterality: N/A;    There were no vitals filed for  this visit.              Wound Therapy - 11/15/18 1345    Subjective  dressing intact and reports no issues    Patient and Family Stated Goals  wound to heal    Date of Onset  08/22/18    Prior Treatments  antibiotics and self care    Pain Scale  0-10    Pain Score  0-No pain    Evaluation and Treatment Procedures Explained to Patient/Family  Yes    Evaluation and Treatment Procedures  agreed to    Wound Properties Date First Assessed: 10/27/18 Time First Assessed:  0920 Wound Type: Laceration Location: Toe (Comment  which one) , Rt Great Toe  Location Orientation: Right;Distal;Lateral Wound Description (Comments): inner phalangeal space on lateral Rt great toe Present on Admission: Yes   Dressing Type  Impregnated gauze (bismuth);Gauze (Comment)    Dressing Changed  Changed    Dressing Change Frequency  PRN    % Wound base Red or Granulating  95%    % Wound base Yellow/Fibrinous Exudate  5%    % Wound base Black/Eschar  0%    Peri-wound Assessment  Intact;Edema    Drainage Amount  Scant    Drainage Description  Serous    Treatment  Cleansed;Debridement (Selective)    Selective Debridement - Location  Rt great toe wound bed and margins    Selective Debridement - Tools Used  Forceps;Scalpel    Selective Debridement - Tissue Removed  slough, devitalized tissue    Wound Therapy - Clinical Statement  removed dried deviitalized tissue from periemeter of wound to promote approximation.  Very little open area remains and very little depth.  Anticpate full healing by end of nex week.      Wound Therapy - Functional Problem List  bathing(cannot get toe dressing wet)    Factors Delaying/Impairing Wound Healing  Altered sensation;Diabetes Mellitus;Multiple medical problems;Vascular compromise    Hydrotherapy Plan  Debridement;Dressing change;Patient/family education;Pulsatile lavage with suction    Wound Therapy - Frequency  2X / week    Wound Therapy - Current Recommendations  PT    Wound Plan  continue with woundcare.  Measure weekly wednesday.     Dressing   xeroform, 2x2, gauze wrap, coban wrap                PT Short Term Goals - 10/27/18 1249      PT SHORT TERM GOAL #1   Title  Patient's wound will be 100% red healthy tissue in wound bed to indicate good progress to proliferative stage of healing.    Time  2    Period  Weeks    Status  New    Target Date  11/10/18        PT Long Term Goals - 10/27/18 1249      PT LONG TERM GOAL #1    Title  Patient's wound will have reduced size by = or >75% area/volume indicating excellent progress with in proliferative healing and good approximation of wound.     Time  4    Period  Weeks    Status  New    Target Date  11/24/18      PT LONG TERM GOAL #2   Title  Patient will demonstrate proepr cleansing and dressing wound to transition to independent wound carea t home to complete healing remaining wound.     Time  4    Period  Weeks  Status  New    Target Date  11/24/18              Patient will benefit from skilled therapeutic intervention in order to improve the following deficits and impairments:     Visit Diagnosis: 1. Open wound of right great toe with damage to nail, initial encounter   2. Pain of right great toe        Problem List Patient Active Problem List   Diagnosis Date Noted  . Dehiscence of external surgical wound 11/26/2016  . 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 Coronary artery disease 11/11/2016  . Accelerating angina ([place]) 09/26/2016  . Morbid obesity due to excess calories ([place]) 11/09/2015  . Colon cancer screening 12/28/2014  . Dyspepsia, NEW ONSET 07/26/2014  . Hepatic cirrhosis ([place]) 05/07/2014  . Low-grade NHL (non-Hodgkin's lymphoma) 05/16/2011  . Lupus anticoagulant positive 05/16/2011  . Gastric ulcer 05/16/2011  . Type 2 diabetes mellitus with vascular disease ([place]) 05/22/2009  . Mixed hyperlipidemia 05/22/2009  . Essential hypertension 05/22/2009  . CORONARY ATHEROSCLEROSIS NATIVE CORONARY ARTERY 05/22/2009   Teena Irani, PTA/CLT [phone]  Teena Irani 11/15/2018, 1:51 PM  [place], [place], [address] Phone: [phone]   Fax:  [phone]  Name: KELLIE CHISOLM MRN: [id number] Date of Birth: 1956-04-20

## 2018-11-18 ENCOUNTER — Ambulatory Visit (HOSPITAL_COMMUNITY): Payer: BC Managed Care – PPO | Attending: Pulmonary Disease

## 2018-11-18 ENCOUNTER — Other Ambulatory Visit: Payer: Self-pay

## 2018-11-18 ENCOUNTER — Encounter (HOSPITAL_COMMUNITY): Payer: Self-pay

## 2018-11-18 DIAGNOSIS — S91201A Unspecified open wound of right great toe with damage to nail, initial encounter: Secondary | ICD-10-CM | POA: Insufficient documentation

## 2018-11-18 DIAGNOSIS — M79674 Pain in right toe(s): Secondary | ICD-10-CM

## 2018-11-18 NOTE — Therapy (Signed)
[place], [place], [address] Phone: [phone]   Fax:  [phone]  Wound Care Therapy & Discharge Summary  Patient Details  Name: Kelly [name] MRN: [id number] Date of Birth: [birth year] Referring Provider (PT): Sinda Du, MD   Encounter Date: 11/18/2018   PHYSICAL THERAPY DISCHARGE SUMMARY  Visits from Start of Care: 6  Current functional level related to goals / functional outcomes: Patient returns and wound has further healed and is almost fully approximated. Little time spent removing dried devitalized tissue from periwound. Educated patient on how to dress wound with xeroform and gauze wrap for 1 more week at home to protect new skin. Educated him he can remove dressing and shower then replace dressing after. Provided dressing agents and gauze for his home care. Encouraged patient to call if he has any difficulty or there are changes to his wound. He agreed and is glad to be discharged today.    Remaining deficits: See below details   Education / Equipment: See below details   Plan: Patient agrees to discharge.  Patient goals were not met. Patient is being discharged due to meeting the stated rehab goals.  ?????       PT End of Session - 11/18/18 0936    Visit Number  6    Number of Visits  8    Date for PT Re-Evaluation  11/24/18    Authorization Type  [place] (20% co-ins, no auth, no visit limit)    Authorization Time Period  10/27/18-11/24/18    PT Start Time  0912    PT Stop Time  0928    PT Time Calculation (min)  16 min    Activity Tolerance  Patient tolerated treatment well    Behavior During Therapy  [facility] for tasks assessed/performed       Past Medical History:  Diagnosis Date  . Anxiety   . Arthritis   . Coronary atherosclerosis of native coronary artery    BMS LAD and PTCA RCA 2000,BMS RCA/PLA 2001, CABG June 2018  . Diverticulitis   . Essential hypertension    . Fatty liver   . Gastric ulcer   . GERD (gastroesophageal reflux disease)   . Hyperlipidemia   . Low-grade NHL (non-Hodgkin's lymphoma) 2007  . Lupus anticoagulant positive   . MI (myocardial infarction) ([place]) 2001  . Peripheral neuropathy   . Type 2 diabetes mellitus ([place])   . Umbilical hernia     Past Surgical History:  Procedure Laterality Date  . Axillary abscess     left incision and drainage left  . BACK SURGERY     multiple  . BIOPSY  08/15/2014   Procedure: BIOPSY;  Surgeon: Danie Binder, MD;  Location: [facility];  Service: Endoscopy;;  Gastric  . BIOPSY  01/19/2015   Procedure: BIOPSY (GASTRIC ULCER);  Surgeon: Danie Binder, MD;  Location: [facility];  Service: Endoscopy;;  . CARDIAC CATHETERIZATION    . cardiac stents     x5 stents  . COLONOSCOPY  June 2008   Dr. Dellis Filbert Medoff: Mild left colonic diverticulosis  . CORONARY ARTERY BYPASS GRAFT N/A 11/11/2016   Procedure: CORONARY ARTERY BYPASS GRAFTING (CABG) x 3, USING LEFT MAMMARY ARTERY AND RIGHT GREATER SAPHENOUS VEIN HARVESTED ENDOSCOPICALLY;  Surgeon: Ivin Poot, MD;  Location: [facility];  Service: Open Heart Surgery;  Laterality: N/A;  . Dental extractions    . ESOPHAGOGASTRODUODENOSCOPY  June 2008   Dr.  Jeffrey Medoff: Gastric ulcer, antral, biopsy with reactive changes associated glandular atrophy, no H pylori. No features of lymphoma. Likely NSAID related  . ESOPHAGOGASTRODUODENOSCOPY  August 2008   Dr. Dellis Filbert Medoff: Complete healing of gastric ulcers.  . ESOPHAGOGASTRODUODENOSCOPY (EGD) WITH PROPOFOL N/A 08/15/2014   SLF: 1. Abnormal pain due to ulcers & gastriitis   . ESOPHAGOGASTRODUODENOSCOPY (EGD) WITH PROPOFOL N/A 01/19/2015   SLF: 1. Persistant ulcer with firm base 2. single gastric polyp found in the gastric antrum. Ulcer base   . EUS N/A 03/21/2015   Thickened gastric wall in pre-pyloric antrum, no obvious intramural mass, fatty pancreas, no obvious pancreatic mass.   . Inguinal lymph node biopsy      right inguinal lymph node biopsy  . INTRAVASCULAR PRESSURE WIRE/FFR STUDY N/A 09/26/2016   Procedure: Intravascular Pressure Wire/FFR Study;  Surgeon: Nelva Bush, MD;  Location: [facility];  Service: Cardiovascular;  Laterality: N/A;  . LEFT HEART CATH AND CORONARY ANGIOGRAPHY N/A 09/26/2016   Procedure: Left Heart Cath and Coronary Angiography;  Surgeon: Nelva Bush, MD;  Location: [facility];  Service: Cardiovascular;  Laterality: N/A;  . NECK SURGERY    . POLYPECTOMY  01/19/2015   Procedure: POLYPECTOMY (GASTRIC);  Surgeon: Danie Binder, MD;  Location: [facility];  Service: Endoscopy;;  . SHOULDER ARTHROSCOPY Left   . TEE WITHOUT CARDIOVERSION N/A 11/11/2016   Procedure: TRANSESOPHAGEAL ECHOCARDIOGRAM (TEE);  Surgeon: Prescott Gum, Collier Salina, MD;  Location: [facility];  Service: Open Heart Surgery;  Laterality: N/A;    There were no vitals filed for this visit.    Wound Therapy - 11/18/18 0931    Subjective  Patient arrives with no complaints and dressing intact.     Patient and Family Stated Goals  wound to heal    Date of Onset  08/22/18    Prior Treatments  antibiotics and self care    Pain Scale  0-10    Pain Score  0-No pain    Evaluation and Treatment Procedures Explained to Patient/Family  Yes    Evaluation and Treatment Procedures  agreed to    Wound Properties Date First Assessed: 10/27/18 Time First Assessed: 0920 Wound Type: Laceration Location: Toe (Comment  which one) , Rt Great Toe  Location Orientation: Right;Distal;Lateral Wound Description (Comments): inner phalangeal space on lateral Rt great toe Present on Admission: Yes   Dressing Type  Impregnated gauze (bismuth);Gauze (Comment)    Dressing Changed  Changed    Dressing Change Frequency  PRN    % Wound base Red or Granulating  95%    % Wound base Yellow/Fibrinous Exudate  5%    % Wound base Black/Eschar  0%    Peri-wound Assessment  Intact;Edema    Wound Length (cm)  0.2 cm    Wound Width (cm)  0.1 cm     Wound Depth (cm)  0.1 cm    Wound Volume (cm^3)  0 cm^3    Wound Surface Area (cm^2)  0.02 cm^2    Drainage Amount  Scant    Drainage Description  Serous    Treatment  Cleansed;Debridement (Selective)    Selective Debridement - Location  Rt great toe wound bed and margins    Selective Debridement - Tools Used  Forceps;Scalpel    Selective Debridement - Tissue Removed  slough, devitalized tissue    Wound Therapy - Clinical Statement  Patient returns and wound has further healed and is almost fully approximated. Little time spent removing dried devitalized tissue  from periwound. Educated patient on how to dress wound with xeroform and gauze wrap for 1 more week at home to protect new skin. Educated him he can remove dressing and shower then replace dressing after. Provided dressing agents and gauze for his home care. Encouraged patient to call if he has any difficulty or there are changes to his wound. He agreed and is glad to be discharged today.     Wound Therapy - Functional Problem List  bathing(cannot get toe dressing wet)    Factors Delaying/Impairing Wound Healing  Altered sensation;Diabetes Mellitus;Multiple medical problems;Vascular compromise    Hydrotherapy Plan  Debridement;Dressing change;Patient/family education;Pulsatile lavage with suction    Wound Therapy - Frequency  2X / week    Wound Therapy - Current Recommendations  PT    Wound Plan  Discharge this session.     Dressing   xeroform, gauze wrap, coban wrap        PT Education - 11/18/18 0935    Education Details  Educated on how to dress wound and to cleanse it while showering then replace dressing. Educated to do so for 1 week and then discontinue.    Person(s) Educated  Patient    Methods  Explanation    Comprehension  Verbalized understanding       PT Short Term Goals - 11/18/18 0937      PT SHORT TERM GOAL #1   Title  Patient's wound will be 100% red healthy tissue in wound bed to indicate good progress to  proliferative stage of healing.    Time  2    Period  Weeks    Status  Achieved    Target Date  11/10/18        PT Long Term Goals - 11/18/18 0937      PT LONG TERM GOAL #1   Title  Patient's wound will have reduced size by = or >75% area/volume indicating excellent progress with in proliferative healing and good approximation of wound.     Time  4    Period  Weeks    Status  Achieved      PT LONG TERM GOAL #2   Title  Patient will demonstrate proepr cleansing and dressing wound to transition to independent wound carea t home to complete healing remaining wound.     Time  4    Period  Weeks    Status  Achieved        Plan - 11/18/18 0936    Clinical Impression Statement  see above    Personal Factors and Comorbidities  Age;Sex;Comorbidity 3+    Comorbidities  DM, PVD, CAD, history of malignant lymphoma    Examination-Activity Limitations  Bathing    Stability/Clinical Decision Making  Evolving/Moderate complexity    Rehab Potential  Good    PT Frequency  2x / week    PT Duration  4 weeks    PT Treatment/Interventions  ADLs/Self Care Home Management;Patient/family education;Passive range of motion;Taping;Other (comment);Manual techniques   wound care: debridement, cleansing, pulsed lavage   PT Next Visit Plan  see above    Consulted and Agree with Plan of Care  Patient       Patient will benefit from skilled therapeutic intervention in order to improve the following deficits and impairments:  Decreased skin integrity, Impaired sensation, Pain  Visit Diagnosis: 1. Open wound of right great toe with damage to nail, initial encounter   2. Pain of right great toe          Problem List Patient Active Problem List   Diagnosis Date Noted  . Dehiscence of external surgical wound 11/26/2016  . 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 Coronary artery disease 11/11/2016  . Accelerating angina ([facility]) 09/26/2016  . Morbid obesity due to excess calories ([facility]) 11/09/2015  . Colon cancer  screening 12/28/2014  . Dyspepsia, NEW ONSET 07/26/2014  . Hepatic cirrhosis ([facility]) 05/07/2014  . Low-grade NHL (non-Hodgkin's lymphoma) 05/16/2011  . Lupus anticoagulant positive 05/16/2011  . Gastric ulcer 05/16/2011  . 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 Mixed hyperlipidemia 05/22/2009  . Essential hypertension 05/22/2009  . CORONARY ATHEROSCLEROSIS NATIVE CORONARY ARTERY 05/22/2009    Rachel Quinn-Brown, PT, DPT, WTA Physical Therapist with [facility]  11/18/2018 9:39 AM    [facility] [address] [place], [place], [address] Phone: [phone]   Fax:  [phone]  Name: Altin G Plath MRN: [id number] Date of Birth: 01/25/1956    

## 2018-11-26 ENCOUNTER — Ambulatory Visit (HOSPITAL_COMMUNITY): Payer: BC Managed Care – PPO

## 2018-12-01 DIAGNOSIS — K429 Umbilical hernia without obstruction or gangrene: Secondary | ICD-10-CM | POA: Insufficient documentation

## 2018-12-01 LAB — HEMOGLOBIN A1C: Hgb A1c MFr Bld: 7 — AB (ref 4.0–6.0)

## 2019-05-24 ENCOUNTER — Other Ambulatory Visit: Payer: Self-pay

## 2019-05-24 ENCOUNTER — Ambulatory Visit: Payer: BC Managed Care – PPO | Attending: Internal Medicine

## 2019-05-24 DIAGNOSIS — Z20822 Contact with and (suspected) exposure to covid-19: Secondary | ICD-10-CM

## 2019-05-26 LAB — NOVEL CORONAVIRUS, NAA: SARS-CoV-2, NAA: NOT DETECTED

## 2019-08-11 ENCOUNTER — Ambulatory Visit: Payer: BC Managed Care – PPO | Admitting: Family Medicine

## 2019-09-12 ENCOUNTER — Other Ambulatory Visit: Payer: Self-pay | Admitting: Nurse Practitioner

## 2019-12-14 ENCOUNTER — Other Ambulatory Visit: Payer: Self-pay | Admitting: Gastroenterology

## 2020-02-10 ENCOUNTER — Telehealth: Payer: Self-pay

## 2020-02-10 ENCOUNTER — Other Ambulatory Visit: Payer: Self-pay

## 2020-02-10 ENCOUNTER — Emergency Department (HOSPITAL_COMMUNITY): Payer: BC Managed Care – PPO

## 2020-02-10 ENCOUNTER — Encounter (HOSPITAL_COMMUNITY): Payer: Self-pay

## 2020-02-10 ENCOUNTER — Emergency Department (HOSPITAL_COMMUNITY)
Admission: EM | Admit: 2020-02-10 | Discharge: 2020-02-10 | Disposition: A | Discharge status: Home / Self Care | Payer: BC Managed Care – PPO | Attending: Emergency Medicine | Admitting: Emergency Medicine

## 2020-02-10 DIAGNOSIS — R001 Bradycardia, unspecified: Secondary | ICD-10-CM | POA: Diagnosis not present

## 2020-02-10 DIAGNOSIS — Z794 Long term (current) use of insulin: Secondary | ICD-10-CM | POA: Insufficient documentation

## 2020-02-10 DIAGNOSIS — Z79899 Other long term (current) drug therapy: Secondary | ICD-10-CM | POA: Insufficient documentation

## 2020-02-10 DIAGNOSIS — I25810 Atherosclerosis of coronary artery bypass graft(s) without angina pectoris: Secondary | ICD-10-CM | POA: Diagnosis not present

## 2020-02-10 DIAGNOSIS — E119 Type 2 diabetes mellitus without complications: Secondary | ICD-10-CM | POA: Insufficient documentation

## 2020-02-10 DIAGNOSIS — I119 Hypertensive heart disease without heart failure: Secondary | ICD-10-CM | POA: Diagnosis not present

## 2020-02-10 DIAGNOSIS — Z87891 Personal history of nicotine dependence: Secondary | ICD-10-CM | POA: Diagnosis not present

## 2020-02-10 DIAGNOSIS — Z7982 Long term (current) use of aspirin: Secondary | ICD-10-CM | POA: Diagnosis not present

## 2020-02-10 DIAGNOSIS — R42 Dizziness and giddiness: Secondary | ICD-10-CM | POA: Diagnosis present

## 2020-02-10 LAB — CBC WITH DIFFERENTIAL/PLATELET
Abs Immature Granulocytes: 0.02 10*3/uL (ref 0.00–0.07)
Basophils Absolute: 0 10*3/uL (ref 0.0–0.1)
Basophils Relative: 1 %
Eosinophils Absolute: 0.2 10*3/uL (ref 0.0–0.5)
Eosinophils Relative: 4 %
HCT: 39.7 % (ref 39.0–52.0)
Hemoglobin: 13 g/dL (ref 13.0–17.0)
Immature Granulocytes: 0 %
Lymphocytes Relative: 30 %
Lymphs Abs: 1.4 10*3/uL (ref 0.7–4.0)
MCH: 32.6 pg (ref 26.0–34.0)
MCHC: 32.7 g/dL (ref 30.0–36.0)
MCV: 99.5 fL (ref 80.0–100.0)
Monocytes Absolute: 0.4 10*3/uL (ref 0.1–1.0)
Monocytes Relative: 10 %
Neutro Abs: 2.5 10*3/uL (ref 1.7–7.7)
Neutrophils Relative %: 55 %
Platelets: 112 10*3/uL — ABNORMAL LOW (ref 150–400)
RBC: 3.99 MIL/uL — ABNORMAL LOW (ref 4.22–5.81)
RDW: 12.1 % (ref 11.5–15.5)
WBC: 4.6 10*3/uL (ref 4.0–10.5)
nRBC: 0 % (ref 0.0–0.2)

## 2020-02-10 LAB — COMPREHENSIVE METABOLIC PANEL
ALT: 28 U/L (ref 0–44)
AST: 34 U/L (ref 15–41)
Albumin: 4.1 g/dL (ref 3.5–5.0)
Alkaline Phosphatase: 83 U/L (ref 38–126)
Anion gap: 8 (ref 5–15)
BUN: 17 mg/dL (ref 8–23)
CO2: 27 mmol/L (ref 22–32)
Calcium: 9 mg/dL (ref 8.9–10.3)
Chloride: 104 mmol/L (ref 98–111)
Creatinine, Ser: 0.86 mg/dL (ref 0.61–1.24)
GFR calc Af Amer: >60 mL/min (ref >60)
GFR calc non Af Amer: >60 mL/min (ref >60)
Glucose, Bld: 115 mg/dL — ABNORMAL HIGH (ref 70–99)
Potassium: 4.1 mmol/L (ref 3.5–5.1)
Sodium: 139 mmol/L (ref 135–145)
Total Bilirubin: 0.9 mg/dL (ref 0.3–1.2)
Total Protein: 7.9 g/dL (ref 6.5–8.1)

## 2020-02-10 LAB — TROPONIN I (HIGH SENSITIVITY)
Troponin I (High Sensitivity): 8 ng/L (ref <18)
Troponin I (High Sensitivity): 11 ng/L (ref <18)

## 2020-02-10 LAB — TSH: TSH: 1.659 u[IU]/mL (ref 0.350–4.500)

## 2020-02-10 NOTE — ED Provider Notes (Signed)
[facility] EMERGENCY DEPARTMENT Provider Note   CSN: [id number] Arrival date & time: 02/10/20  1246     History Chief Complaint  Patient presents with   Dizziness   Bradycardia    Kelly [name] is a 64 y.o. male with PMH/o CAD (with CABG in 2018), HTN, GERD, HLD, who presents for evaluation of bradycardia and dizziness/lightheadedness.  He did have routine follow-up appointment with his bariatric surgeon today and while he was there, he was found to be bradycardic with heart rate in the 30s.  He was sent to the ED for further evaluation.  He had tells me that over the last 1 to 2 months, he has had intermittent episodes of dizziness and lightheadedness.  He states that this has been mainly when he gets up from a standing position or gets up in the morning.  He denies any room spinning sensation.  He does not have any focal weakness.  He denies any chest pain.  He states that this does not happen all the time it is gotten more frequent and more severe over the last few weeks.  He has been seen by cardiology previously but has not seen Dr. Domenic Polite since 2018.  He used to be on a beta-blocker but has not taken it in several years.  Wife reports that he was taken off of it after his surgery.  Patient denies any recent fevers, cough, chills, abdominal pain, nausea/vomiting, chest pain.  The history is provided by the patient.       Past Medical History:  Diagnosis Date   Anxiety    Arthritis    Coronary atherosclerosis of native coronary artery    BMS LAD and PTCA RCA 2000,BMS RCA/PLA 2001, CABG June 2018   Diverticulitis    Essential hypertension    Fatty liver    Gastric ulcer    GERD (gastroesophageal reflux disease)    Hyperlipidemia    Low-grade NHL (non-Hodgkin's lymphoma) 2007   Lupus anticoagulant positive    MI (myocardial infarction) ([place]) 2001   Peripheral neuropathy    Type 2 diabetes mellitus ([place])    Umbilical hernia     Patient Active Problem  List   Diagnosis Date Noted   Dehiscence of external surgical wound 11/26/2016   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   Coronary artery disease 11/11/2016   Accelerating angina ([place]) 09/26/2016   Morbid obesity due to excess calories ([place]) 11/09/2015   Colon cancer screening 12/28/2014   Dyspepsia, NEW ONSET 07/26/2014   Hepatic cirrhosis (HCC) 05/07/2014   Low-grade NHL (non-Hodgkin's lymphoma) 05/16/2011   Lupus anticoagulant positive 05/16/2011   Gastric ulcer 05/16/2011   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   Mixed hyperlipidemia 05/22/2009   Essential hypertension 05/22/2009   CORONARY ATHEROSCLEROSIS NATIVE CORONARY ARTERY 05/22/2009    Past Surgical History:  Procedure Laterality Date   Axillary abscess     left incision and drainage left   BACK SURGERY     multiple   BIOPSY  08/15/2014   Procedure: BIOPSY;  Surgeon: Danie Binder, MD;  Location: [facility];  Service: Endoscopy;;  Gastric   BIOPSY  01/19/2015   Procedure: BIOPSY (GASTRIC ULCER);  Surgeon: Danie Binder, MD;  Location: [facility];  Service: Endoscopy;;   CARDIAC CATHETERIZATION     cardiac stents     x5 stents   COLONOSCOPY  June 2008   Dr. Dellis Filbert Medoff: Mild left colonic diverticulosis   CORONARY ARTERY BYPASS GRAFT N/A  11/11/2016   Procedure: CORONARY ARTERY BYPASS GRAFTING (CABG) x 3, USING LEFT MAMMARY ARTERY AND RIGHT GREATER SAPHENOUS VEIN HARVESTED ENDOSCOPICALLY;  Surgeon: Ivin Poot, MD;  Location: [place];  Service: Open Heart Surgery;  Laterality: N/A;   Dental extractions     ESOPHAGOGASTRODUODENOSCOPY  June 2008   Dr. Dellis Filbert Medoff: Gastric ulcer, antral, biopsy with reactive changes associated glandular atrophy, no H pylori. No features of lymphoma. Likely NSAID related   ESOPHAGOGASTRODUODENOSCOPY  August 2008   Dr. Dellis Filbert Medoff: Complete healing of gastric ulcers.   ESOPHAGOGASTRODUODENOSCOPY (EGD) WITH PROPOFOL N/A 08/15/2014   SLF: 1. Abnormal  pain due to ulcers & gastriitis    ESOPHAGOGASTRODUODENOSCOPY (EGD) WITH PROPOFOL N/A 01/19/2015   SLF: 1. Persistant ulcer with firm base 2. single gastric polyp found in the gastric antrum. Ulcer base    EUS N/A 03/21/2015   Thickened gastric wall in pre-pyloric antrum, no obvious intramural mass, fatty pancreas, no obvious pancreatic mass.    Inguinal lymph node biopsy     right inguinal lymph node biopsy   INTRAVASCULAR PRESSURE WIRE/FFR STUDY N/A 09/26/2016   Procedure: Intravascular Pressure Wire/FFR Study;  Surgeon: Nelva Bush, MD;  Location: [place] [facility];  Service: Cardiovascular;  Laterality: N/A;   LEFT HEART CATH AND CORONARY ANGIOGRAPHY N/A 09/26/2016   Procedure: Left Heart Cath and Coronary Angiography;  Surgeon: Nelva Bush, MD;  Location: [place] [facility];  Service: Cardiovascular;  Laterality: N/A;   NECK SURGERY     POLYPECTOMY  01/19/2015   Procedure: POLYPECTOMY (GASTRIC);  Surgeon: Danie Binder, MD;  Location: [facility];  Service: Endoscopy;;   SHOULDER ARTHROSCOPY Left    TEE WITHOUT CARDIOVERSION N/A 11/11/2016   Procedure: TRANSESOPHAGEAL ECHOCARDIOGRAM (TEE);  Surgeon: Prescott Gum, Collier Salina, MD;  Location: [place];  Service: Open Heart Surgery;  Laterality: N/A;       Family History  Problem Relation Age of Onset   Pancreatic cancer Maternal Grandmother    Obesity Mother    Hypertension Mother    Diabetes Father    Thyroid disease Father    Heart disease Father    Colon cancer Neg Hx     Social History   Tobacco Use   Smoking status: Former Smoker    Packs/day: 1.00    Years: 23.00    Pack years: 23.00    Types: Cigarettes    Start date: 10/30/1970    Quit date: 10/29/1993    Years since quitting: 26.3   Smokeless tobacco: Former Systems developer    Types: Snuff, Database administrator Use   Vaping Use: Never used  Substance Use Topics   Alcohol use: No    Alcohol/week: 0.0 standard drinks   Drug use: No    Home Medications Prior to  Admission medications   Medication Sig Start Date End Date Taking? Authorizing Provider  acetaminophen (TYLENOL) 325 MG tablet Take 650 mg by mouth every 6 (six) hours as needed for mild pain.   Yes [provider]  Ascorbic Acid (VITAMIN C) 500 MG CAPS Take 500 mg by mouth daily.   Yes [provider]  aspirin EC 81 MG tablet Take 81 mg by mouth daily.   Yes [provider]  celecoxib (CELEBREX) 200 MG capsule Take 200 mg by mouth daily. 01/09/20  Yes [provider]  cholecalciferol (VITAMIN D) 1000 units tablet Take 2,000 Units by mouth daily.   Yes [provider]  ferrous gluconate (FERGON) 240 (27 FE) MG tablet Take  240 mg by mouth daily.    Yes [provider]  FLUoxetine (PROZAC) 20 MG capsule Take 20 mg by mouth daily.   Yes [provider]  gabapentin (NEURONTIN) 300 MG capsule Take 600 mg by mouth at bedtime.    Yes [provider]  Insulin Glargine (BASAGLAR KWIKPEN) 100 UNIT/ML SOPN INJECT 80 UNITS INTO SKIN DAILY AT 10 PM Patient taking differently: Inject 10 Units into the skin at bedtime as needed (Depending on sugar level).  10/20/17  Yes Cassandria Anger, MD  Multiple Vitamin (MULTIVITAMIN) tablet Take 1 tablet by mouth daily.    Yes [provider]  oxyCODONE-acetaminophen (PERCOCET/ROXICET) 5-325 MG tablet Take 1 tablet by mouth every 6 (six) hours as needed. Patient taking differently: Take 1 tablet by mouth every 6 (six) hours as needed for moderate pain.  08/22/18  Yes Lily Kocher, PA-C  pantoprazole (PROTONIX) 40 MG tablet TAKE 1 TABLET BY MOUTH DAILY BEFORE BREAKFAST Patient taking differently: Take 40 mg by mouth daily.  09/15/19  Yes Erenest Rasher, PA-C  Pediatric Multivitamins-[facility] COMPLETE) 18 MG CHEW Chew 1 tablet by mouth daily.    Yes [provider]  simvastatin (ZOCOR) 10 MG tablet TAKE 1 TABLET BY MOUTH EVERY NIGHT AT BEDTIME Patient taking differently:  Take 10 mg by mouth at bedtime.  04/29/12  Yes Satira Sark, MD  cephALEXin (KEFLEX) 500 MG capsule Take 1 capsule (500 mg total) by mouth 4 (four) times daily. Patient not taking: Reported on 02/10/2020 08/22/18   Lily Kocher, PA-C  Continuous Blood Gluc Sensor ([place]) MISC Use one sensor every 10 days. 03/06/17   Cassandria Anger, MD  glucose blood (ONETOUCH VERIO) test strip USE AS DIRECTED FOUR- FIVE TIMES DAILY 09/18/17   Nida, Marella Chimes, MD  insulin aspart (NOVOLOG FLEXPEN) 100 UNIT/ML FlexPen Inject 25-31 Units into the skin 3 (three) times daily with meals. Patient not taking: Reported on 02/10/2020 09/18/17   Cassandria Anger, MD  metFORMIN (GLUCOPHAGE) 500 MG tablet Take 1 tablet (500 mg total) by mouth 2 (two) times daily with a meal. Patient not taking: Reported on 02/10/2020 09/29/16   End, Harrell Gave, MD  nitroGLYCERIN (NITROSTAT) 0.4 MG SL tablet Place 1 tablet (0.4 mg total) under the tongue every 5 (five) minutes as needed. Patient taking differently: Place 0.4 mg under the tongue every 5 (five) minutes as needed for chest pain.  06/01/15   Satira Sark, MD  ONETOUCH VERIO test strip USE TO TEST FOUR TIMES DAILY AS DIRECTED Patient taking differently: 1 each by Other route in the morning, at noon, in the evening, and at bedtime.  12/22/17   Cassandria Anger, MD  oxyCODONE (ROXICODONE) 15 MG immediate release tablet Take 1 tablet (15 mg total) by mouth every 4 (four) hours as needed for pain. 11/21/16   Barrett, Erin R, PA-C  VICTOZA 18 MG/3ML SOPN ADMINISTER 1.8 MG UNDER THE SKIN EVERY DAY 06/01/17   Cassandria Anger, MD    Allergies    Patient has no active allergies.  Review of Systems   Review of Systems  Constitutional: Negative for fever.  Respiratory: Negative for cough and shortness of breath.   Cardiovascular: Negative for chest pain.  Gastrointestinal: Negative for abdominal pain, nausea and vomiting.    Genitourinary: Negative for dysuria and hematuria.  Neurological: Positive for dizziness and light-headedness. Negative for headaches.  All other systems reviewed and are negative.   Physical Exam Updated Vital  Signs BP (!) 163/70    Pulse (!) 55    Temp (!) 97.5 F (36.4 C)    Resp 15    Ht 6\' 3"  (1.905 m)    Wt 98.4 kg    SpO2 99%    BMI 27.12 kg/m   Physical Exam Vitals and nursing note reviewed.  Constitutional:      Appearance: Normal appearance. He is well-developed.  HENT:     Head: Normocephalic and atraumatic.  Eyes:     General: Lids are normal.     Conjunctiva/sclera: Conjunctivae normal.     Pupils: Pupils are equal, round, and reactive to light.  Cardiovascular:     Rate and Rhythm: Bradycardia present. Rhythm irregularly irregular.     Pulses: Normal pulses.     Heart sounds: Normal heart sounds. No murmur heard.  No friction rub. No gallop.   Pulmonary:     Effort: Pulmonary effort is normal.     Breath sounds: Normal breath sounds.     Comments: Lungs clear to auscultation bilaterally.  Symmetric chest rise.  No wheezing, rales, rhonchi. Abdominal:     Palpations: Abdomen is soft. Abdomen is not rigid.     Tenderness: There is no abdominal tenderness. There is no guarding.     Comments: Abdomen is soft, non-distended, non-tender. No rigidity, No guarding. No peritoneal signs.  Musculoskeletal:        General: Normal range of motion.     Cervical back: Full passive range of motion without pain.  Skin:    General: Skin is warm and dry.     Capillary Refill: Capillary refill takes less than 2 seconds.  Neurological:     Mental Status: He is alert and oriented to person, place, and time.     Comments: Cranial nerves III-XII intact Follows commands, Moves all extremities  5/5 strength to BUE and BLE  Sensation intact throughout all major nerve distributions No slurred speech. No facial droop.   Psychiatric:        Speech: Speech normal.     ED Results  / Procedures / Treatments   Labs (all labs ordered are listed, but only abnormal results are displayed) Labs Reviewed  COMPREHENSIVE METABOLIC PANEL - Abnormal; Notable for the following components:      Result Value   Glucose, Bld 115 (*)    All other components within normal limits  CBC WITH DIFFERENTIAL/PLATELET - Abnormal; Notable for the following components:   RBC 3.99 (*)    Platelets 112 (*)    All other components within normal limits  TSH  TROPONIN I (HIGH SENSITIVITY)  TROPONIN I (HIGH SENSITIVITY)    EKG None  Radiology DG Chest Portable 1 View  Result Date: 02/10/2020 CLINICAL DATA:  Lightheadedness.  Slow heart rate. EXAM: PORTABLE CHEST 1 VIEW COMPARISON:  12/24/2016 FINDINGS: Mild enlargement the cardiac silhouette. Aortic atherosclerosis. Prior CABG with median sternotomy. No consolidation. No visible pleural effusions. No pneumothorax. The visualized skeletal structures are unremarkable. Similar metallic fragments projecting over the left axilla. Remote right clavicular fracture. IMPRESSION: 1. Pulmonary vascular congestion without frank pulmonary edema. No consolidation. 2. Mild cardiomegaly. Electronically Signed   By: Margaretha Sheffield MD   On: 02/10/2020 14:51    Procedures Procedures (including critical care time)  Medications Ordered in ED Medications - No data to display  ED Course  I have reviewed the triage vital signs and the nursing notes.  Pertinent labs & imaging results that were available during my care  of the patient were reviewed by me and considered in my medical decision making (see chart for details).    MDM Rules/Calculators/A&P                          64 year old male with past medical history of hypertension, CAD (CABG) who presents for evaluation of bradycardia, lightheadedness/dizziness.  Had a follow-up appointment this.  And was found to be bradycardic.  Sent to ED for further evaluation.  Reports episodes of intermittent  dizziness/lightheadedness for last month that occurred more frequent more severe.  Initially arrival, he is afebrile but is bradycardic with a pulse of 42.  He is consistently bradycardia with heart rate between 40-47.  No chest pain.  No neurological deficits that would be concerning for central cause of dizziness/vertigo.  I suspect the dizziness/lightheadedness is symptomatic bradycardia.  He does have metoprolol listed in his chart but I discussed with wife and she states that he was taken off of that and has not taken it for several years.  No other beta-blocker listed.  We will plan to check basic labs, chest x-ray.  Trop is negative. CMP shows glucose of 115. BUN/Cr within normal limits. CXR shows pulmonary vascular congestion without frank pulmonary edema. No consolidation. CBC shows no leukocytosis or anemia.   Patient consistent with HR ranging from 40-50. Given that he has had continued bradycardia will discuss with cardiology.   Discussed patient with Dr. Harl Bowie (Cardiology) who evaluated patient and his telemetry.  At this time, patient does not any syncopal episodes and his blood pressure is stable feels that patient is stable for discharge home.  He will make an appointment for patient early next week in the cardiology office to discuss pacemaker.  He has added on a TSH.  Discussed plan with wife and patient.  They are both comfortable with this.  Patient knows that if he has any episodes of syncope, worsening lightheadedness, chest pain, he is to return the emergency department immediately. At this time, patient exhibits no emergent life-threatening condition that require further evaluation in ED. Patient had ample opportunity for questions and discussion. All patient's questions were answered with full understanding. Strict return precautions discussed. Patient expresses understanding and agreement to plan.   Portions of this note were generated with Lobbyist. Dictation  errors may occur despite best attempts at proofreading.   Final Clinical Impression(s) / ED Diagnoses Final diagnoses:  Bradycardia    Rx / DC Orders ED Discharge Orders    None       Desma Mcgregor 02/10/20 1651    Noemi Chapel, MD 02/11/20 [phone]

## 2020-02-10 NOTE — ED Triage Notes (Signed)
Sent for evaluation of slow heart rate

## 2020-02-10 NOTE — ED Provider Notes (Signed)
Obese male with hx of CABG in the past - also known to have gastric bypass surgery a couple of years ago - at f/u visit for same - found to have bradycardia - (see's Dr. Domenic Polite for Cards f/u), but no hx of definitive bradycarida though has known A fib. - he has intermittent light headedness.  Had been on Lopressor in the past  On exam the patient is bradycardic, rate between 30 and 40, chronic right lower extremity edema, no distress  Blood pressure 173/64, afebrile.  EKG does show A. fib, bradycardia, patient does not seem to be overly symptomatic at this time.  Will discuss with cardiology, labs.  Medical screening examination/treatment/procedure(s) were conducted as a shared visit with non-physician practitioner(s) and myself.  I personally evaluated the patient during the encounter.  Clinical Impression:   Final diagnoses:  Bradycardia          Noemi Chapel, MD 02/11/20 [phone]

## 2020-02-10 NOTE — Progress Notes (Signed)
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 Not on any av nodal agents. EKG reviewed shows wenchebach, telemetry shows wenchebach, sinus pauses up to 2 seconds, no high degree blockm rates 40s to 50s. Normal bp's, dizziness at home at times but no syncope, came in after a routine clinic visit showed low heart rates. . I think he is stable for an outpatient EP evaluation, particularly given the ongoing bed limitations within the system. Reasonable for an outpatient workup, we will arrange a close EP appt   Zandra Abts MD

## 2020-02-10 NOTE — Telephone Encounter (Signed)
Per Dr.Branch, ref placed to EP, Dr.Lambert for bradycardia.Apt already obtained

## 2020-02-10 NOTE — Discharge Instructions (Addendum)
As we discussed, it is very important for you to follow-up with cardiology next week.  They will arrange for an appointment as listed above.  Please contact their office for time of your appointment.  If you go home and have any episodes of passing out, feeling worsening dizziness/lightheadedness, chest pain, return emergency department immediately.

## 2020-02-14 NOTE — H&P (View-Only) (Signed)
Electrophysiology Office Note:    Date:  02/15/2020   ID:  Kelly [name], DOB 04-Jun-1955, MRN [id number]  PCP:  Celene Squibb, MD  [facility] Cardiologist:  Rozann Lesches, MD  [facility] Electrophysiologist:  Vickie Epley, MD   Referring MD: Celene Squibb, MD   Chief Complaint: Symptomatic bradycardia  History of Present Illness:    Kelly [name] is a 64 y.o. male who presents for an evaluation of symptomatic bradycardia at the request of Dr Sabra Heck. Their medical history includes CAD s/p CABG in 2018, HTN, GERD, obesity s/p bariatric surgery. He tells me today that he has almost completely come off of his diabetes medications since his bariatric surgery.  He has been feeling poorly over the past few months with increasingly frequent episodes of presyncope. He was in his bariatric surgeon's office earlier this month when he experienced dizziness. ECG was performed which demonstrated bradycardia with ventricular rates in the 30s.  Kelly [name] is very active. 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   Past Medical History:  Diagnosis Date  . Anxiety   . Arthritis   . Coronary atherosclerosis of native coronary artery    BMS LAD and PTCA RCA 2000,BMS RCA/PLA 2001, CABG June 2018  . Diverticulitis   . Essential hypertension   . Fatty liver   . Gastric ulcer   . GERD (gastroesophageal reflux disease)   . Hyperlipidemia   . Low-grade NHL (non-Hodgkin's lymphoma) 2007  . Lupus anticoagulant positive   . MI (myocardial infarction) ([place]) 2001  . Peripheral neuropathy   . Type 2 diabetes mellitus ([place])   . Umbilical hernia     Past Surgical History:  Procedure Laterality Date  . Axillary abscess     left incision and drainage left  . BACK SURGERY     multiple  . BIOPSY  08/15/2014   Procedure: BIOPSY;  Surgeon: Danie Binder, MD;  [facility];  Service: Endoscopy;;  Gastric  . BIOPSY  01/19/2015   Procedure: BIOPSY  (GASTRIC ULCER);  Surgeon: Danie Binder, MD;  [facility];  Service: Endoscopy;;  . CARDIAC CATHETERIZATION    . cardiac stents     x5 stents  . COLONOSCOPY  June 2008   Dr. Dellis Filbert Medoff: Mild left colonic diverticulosis  . CORONARY ARTERY BYPASS GRAFT N/A 11/11/2016   Procedure: CORONARY ARTERY BYPASS GRAFTING (CABG) x 3, USING LEFT MAMMARY ARTERY AND RIGHT GREATER SAPHENOUS VEIN HARVESTED ENDOSCOPICALLY;  Surgeon: Ivin Poot, MD;  Location: [place];  Service: Open Heart Surgery;  Laterality: N/A;  . Dental extractions    . ESOPHAGOGASTRODUODENOSCOPY  June 2008   Dr. Dellis Filbert Medoff: Gastric ulcer, antral, biopsy with reactive changes associated glandular atrophy, no H pylori. No features of lymphoma. Likely NSAID related  . ESOPHAGOGASTRODUODENOSCOPY  August 2008   Dr. Dellis Filbert Medoff: Complete healing of gastric ulcers.  . ESOPHAGOGASTRODUODENOSCOPY (EGD) WITH PROPOFOL N/A 08/15/2014   SLF: 1. Abnormal pain due to ulcers & gastriitis   . ESOPHAGOGASTRODUODENOSCOPY (EGD) WITH PROPOFOL N/A 01/19/2015   SLF: 1. Persistant ulcer with firm base 2. single gastric polyp found in the gastric antrum. Ulcer base   . EUS N/A 03/21/2015   Thickened gastric wall in pre-pyloric antrum, no obvious intramural mass, fatty pancreas, no obvious pancreatic mass.   . Inguinal lymph node biopsy     right inguinal lymph node biopsy  . INTRAVASCULAR PRESSURE WIRE/FFR STUDY  N/A 09/26/2016   Procedure: Intravascular Pressure Wire/FFR Study;  Surgeon: Nelva Bush, MD;  Location: [place] [facility];  Service: Cardiovascular;  Laterality: N/A;  . LEFT HEART CATH AND CORONARY ANGIOGRAPHY N/A 09/26/2016   Procedure: Left Heart Cath and Coronary Angiography;  Surgeon: Nelva Bush, MD;  Location: [place] [facility];  Service: Cardiovascular;  Laterality: N/A;  . NECK SURGERY    . POLYPECTOMY  01/19/2015   Procedure: POLYPECTOMY (GASTRIC);  Surgeon: Danie Binder, MD;  [facility];  Service:  Endoscopy;;  . SHOULDER ARTHROSCOPY Left   . TEE WITHOUT CARDIOVERSION N/A 11/11/2016   Procedure: TRANSESOPHAGEAL ECHOCARDIOGRAM (TEE);  Surgeon: Prescott Gum, Collier Salina, MD;  Location: [place];  Service: Open Heart Surgery;  Laterality: N/A;    Current Medications: Current Meds  Medication Sig  . acetaminophen (TYLENOL) 325 MG tablet Take 650 mg by mouth every 6 (six) hours as needed for mild pain.  . Ascorbic Acid (VITAMIN C) 500 MG CAPS Take 500 mg by mouth daily.  Marland Kitchen aspirin EC 81 MG tablet Take 81 mg by mouth daily.  . celecoxib (CELEBREX) 200 MG capsule Take 200 mg by mouth daily.  . cholecalciferol (VITAMIN D) 1000 units tablet Take 2,000 Units by mouth daily.  . Continuous Blood Gluc Sensor (FREESTYLE LIBRE SENSOR SYSTEM) MISC Use one sensor every 10 days.  . ferrous gluconate (FERGON) 240 (27 FE) MG tablet Take 240 mg by mouth daily.   Marland Kitchen FLUoxetine (PROZAC) 20 MG capsule Take 20 mg by mouth daily.  Marland Kitchen gabapentin (NEURONTIN) 300 MG capsule Take 600 mg by mouth at bedtime.   Marland Kitchen glucose blood (ONETOUCH VERIO) test strip USE AS DIRECTED FOUR- FIVE TIMES DAILY  . Insulin Glargine (BASAGLAR KWIKPEN) 100 UNIT/ML SOPN INJECT 80 UNITS INTO SKIN DAILY AT 10 PM (Patient taking differently: Inject 10 Units into the skin at bedtime as needed (Depending on sugar level). )  . Multiple Vitamin (MULTIVITAMIN) tablet Take 1 tablet by mouth daily.   . nitroGLYCERIN (NITROSTAT) 0.4 MG SL tablet Place 1 tablet (0.4 mg total) under the tongue every 5 (five) minutes as needed.  Glory Rosebush VERIO test strip USE TO TEST FOUR TIMES DAILY AS DIRECTED  . oxyCODONE-acetaminophen (PERCOCET/ROXICET) 5-325 MG tablet Take 1 tablet by mouth every 6 (six) hours as needed.  . pantoprazole (PROTONIX) 40 MG tablet TAKE 1 TABLET BY MOUTH DAILY BEFORE BREAKFAST  . Pediatric Multivitamins-Iron (FLINTSTONES COMPLETE) 18 MG CHEW Chew 1 tablet by mouth daily.   . simvastatin (ZOCOR) 10 MG tablet TAKE 1 TABLET BY MOUTH EVERY NIGHT AT  BEDTIME     Allergies:   Patient has no known allergies.   Social History   Socioeconomic History  . Marital status: Married    Spouse name: Not on file  . Number of children: Not on file  . Years of education: Not on file  . Highest education level: Not on file  Occupational History  . Not on file  Tobacco Use  . Smoking status: Former Smoker    Packs/day: 1.00    Years: 23.00    Pack years: 23.00    Types: Cigarettes    Start date: 10/30/1970    Quit date: 10/29/1993    Years since quitting: 26.3  . Smokeless tobacco: Former Systems developer    Types: Snuff, Chew  Vaping Use  . Vaping Use: Never used  Substance and Sexual Activity  . Alcohol use: No    Alcohol/week: 0.0 standard drinks  . Drug use:  No  . Sexual activity: Never    Birth control/protection: None  Other Topics Concern  . Not on file  Social History Narrative  . Not on file   Social Determinants of Health   Financial Resource Strain:   . Difficulty of Paying Living Expenses: Not on file  Food Insecurity:   . Worried About Charity fundraiser in the Last Year: Not on file  . Ran Out of Food in the Last Year: Not on file  Transportation Needs:   . Lack of Transportation (Medical): Not on file  . Lack of Transportation (Non-Medical): Not on file  Physical Activity:   . Days of Exercise per Week: Not on file  . Minutes of Exercise per Session: Not on file  Stress:   . Feeling of Stress : Not on file  Social Connections:   . Frequency of Communication with Friends and Family: Not on file  . Frequency of Social Gatherings with Friends and Family: Not on file  . Attends Religious Services: Not on file  . Active Member of Clubs or Organizations: Not on file  . Attends Archivist Meetings: Not on file  . Marital Status: Not on file     Family History: The patient's family history includes Diabetes in his father; Heart disease in his father; Hypertension in his mother; Obesity in his mother; Pancreatic  cancer in his maternal grandmother; Thyroid disease in his father. There is no history of Colon cancer.  ROS:   Please see the history of present illness.    All other systems reviewed and are negative.  EKGs/Labs/Other Studies Reviewed:    The following studies were reviewed today: ECG  02/10/2020 ECG personally reviewed    EKG:  The ekg ordered today demonstrates RBBB, LAFB, first degree AV delay  Recent Labs: 02/10/2020: ALT 28; BUN 17; Creatinine, Ser 0.86; Hemoglobin 13.0; Platelets 112; Potassium 4.1; Sodium 139; TSH 1.659  Recent Lipid Panel    Component Value Date/Time   CHOL 107 03/02/2017 1615   TRIG 164 (H) 03/02/2017 1615   HDL 39 (L) 03/02/2017 1615   CHOLHDL 2.7 03/02/2017 1615   VLDL 56 (H) 01/28/2016 1602   LDLCALC 44 03/02/2017 1615    Physical Exam:    VS:  BP 140/68   Pulse 63   Ht 6\' 3"  (1.905 m)   Wt 226 lb (102.5 kg)   SpO2 98%   BMI 28.25 kg/m     Wt Readings from Last 3 Encounters:  02/15/20 226 lb (102.5 kg)  02/10/20 217 lb (98.4 kg)  08/22/18 216 lb (98 kg)     GEN:  Well nourished, well developed in no acute distress HEENT: Normal NECK: No JVD; No carotid bruits LYMPHATICS: No lymphadenopathy CARDIAC: RRR, no murmurs, rubs, gallops RESPIRATORY:  Clear to auscultation without rales, wheezing or rhonchi  ABDOMEN: Soft, non-tender, non-distended MUSCULOSKELETAL:  No edema; No deformity  SKIN: Warm and dry NEUROLOGIC:  Alert and oriented x 3 PSYCHIATRIC:  Normal affect   ASSESSMENT:    1. Trifascicular block   2. Symptomatic bradycardia   3. Hx of bariatric surgery   4. Type 2 diabetes mellitus without complication, with long-term current use of insulin (HCC)    PLAN:    In order of problems listed above:  1. Symptomatic bradycardia/Trifascicular block He has episodes of symptomatic bradycardia and a baseline ECG demonstrating trifascicular block. I feel like Kelly [name] would benefit from a dual chamber permanent pacemaker. I  have discussed  the procedure with him and his wife in depth and they would like to proceed. Will check a surface echocardiogram prior to the procedure to assess for structural heart disease.   Risks, benefits, alternatives to PPM implantation were discussed in detail with the patient today. The patient understands that the risks include but are not limited to bleeding, infection, pneumothorax, perforation, tamponade, vascular damage, renal failure, MI, stroke, death, and lead dislodgement and wishes to proceed.  We will therefore schedule device implantation at the next available time.  We spent time today discussing possible interactions of welding equipment and the pacemaker. I discussed the case with one of the [employer] representatives who plans to do a site visit to Kelly [name]'s place of work to confirm appropriate programming to avoid EMI.  2. Hx of Bariatric Surgery Doing well post op. Has kept the weight off. Almost off all of his insulin.  3. DM His insulin requirements have decreased significantly.   Medication Adjustments/Labs and Tests Ordered: Current medicines are reviewed at length with the patient today.  Concerns regarding medicines are outlined above.  Orders Placed This Encounter  Procedures  . EKG 12-Lead  . ECHOCARDIOGRAM COMPLETE   No orders of the defined types were placed in this encounter.    Signed, Lars Mage, MD, [facility]  02/15/2020 7:45 PM    Electrophysiology [facility]     Anticoagulation instructions: The patient is not on anticoagulation.  Medication instructions morning of: The patient should hold ALL medications the morning of the procedure   Discharge: Our plan will be to discharge the patient same day after a period of observation

## 2020-02-14 NOTE — Progress Notes (Addendum)
Electrophysiology Office Note:    Date:  02/15/2020   ID:  Kelly [name], DOB [birth year], MRN [id number]  PCP:  Celene Squibb, MD  [facility] Cardiologist:  Rozann Lesches, MD  [facility] Electrophysiologist:  Vickie Epley, MD   Referring MD: Celene Squibb, MD   Chief Complaint: Symptomatic bradycardia  History of Present Illness:    Kelly [name] is a 64 y.o. male who presents for an evaluation of symptomatic bradycardia at the request of Dr Sabra Heck. Their medical history includes CAD s/p CABG in 2018, HTN, GERD, obesity s/p bariatric surgery. He tells me today that he has almost completely come off of his diabetes medications since his bariatric surgery.  He has been feeling poorly over the past few months with increasingly frequent episodes of presyncope. He was in his bariatric surgeon's office earlier this month when he experienced dizziness. ECG was performed which demonstrated bradycardia with ventricular rates in the 30s.  Kelly [name] is very active. 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   Past Medical History:  Diagnosis Date  . Anxiety   . Arthritis   . Coronary atherosclerosis of native coronary artery    BMS LAD and PTCA RCA 2000,BMS RCA/PLA 2001, CABG June 2018  . Diverticulitis   . Essential hypertension   . Fatty liver   . Gastric ulcer   . GERD (gastroesophageal reflux disease)   . Hyperlipidemia   . Low-grade NHL (non-Hodgkin's lymphoma) 2007  . Lupus anticoagulant positive   . MI (myocardial infarction) ([place]) 2001  . Peripheral neuropathy   . Type 2 diabetes mellitus ([place])   . Umbilical hernia     Past Surgical History:  Procedure Laterality Date  . Axillary abscess     left incision and drainage left  . BACK SURGERY     multiple  . BIOPSY  08/15/2014   Procedure: BIOPSY;  Surgeon: Danie Binder, MD;  [facility];  Service: Endoscopy;;  Gastric  . BIOPSY  01/19/2015   Procedure: BIOPSY  (GASTRIC ULCER);  Surgeon: Danie Binder, MD;  [facility];  Service: Endoscopy;;  . CARDIAC CATHETERIZATION    . cardiac stents     x5 stents  . COLONOSCOPY  June 2008   Dr. Dellis Filbert Medoff: Mild left colonic diverticulosis  . CORONARY ARTERY BYPASS GRAFT N/A 11/11/2016   Procedure: CORONARY ARTERY BYPASS GRAFTING (CABG) x 3, USING LEFT MAMMARY ARTERY AND RIGHT GREATER SAPHENOUS VEIN HARVESTED ENDOSCOPICALLY;  Surgeon: Ivin Poot, MD;  Location: [place];  Service: Open Heart Surgery;  Laterality: N/A;  . Dental extractions    . ESOPHAGOGASTRODUODENOSCOPY  June 2008   Dr. Dellis Filbert Medoff: Gastric ulcer, antral, biopsy with reactive changes associated glandular atrophy, no H pylori. No features of lymphoma. Likely NSAID related  . ESOPHAGOGASTRODUODENOSCOPY  August 2008   Dr. Dellis Filbert Medoff: Complete healing of gastric ulcers.  . ESOPHAGOGASTRODUODENOSCOPY (EGD) WITH PROPOFOL N/A 08/15/2014   SLF: 1. Abnormal pain due to ulcers & gastriitis   . ESOPHAGOGASTRODUODENOSCOPY (EGD) WITH PROPOFOL N/A 01/19/2015   SLF: 1. Persistant ulcer with firm base 2. single gastric polyp found in the gastric antrum. Ulcer base   . EUS N/A 03/21/2015   Thickened gastric wall in pre-pyloric antrum, no obvious intramural mass, fatty pancreas, no obvious pancreatic mass.   . Inguinal lymph node biopsy     right inguinal lymph node biopsy  . INTRAVASCULAR PRESSURE WIRE/FFR STUDY  N/A 09/26/2016   Procedure: Intravascular Pressure Wire/FFR Study;  Surgeon: Nelva Bush, MD;  Location: [place] [facility];  Service: Cardiovascular;  Laterality: N/A;  . LEFT HEART CATH AND CORONARY ANGIOGRAPHY N/A 09/26/2016   Procedure: Left Heart Cath and Coronary Angiography;  Surgeon: Nelva Bush, MD;  Location: [place] [facility];  Service: Cardiovascular;  Laterality: N/A;  . NECK SURGERY    . POLYPECTOMY  01/19/2015   Procedure: POLYPECTOMY (GASTRIC);  Surgeon: Danie Binder, MD;  [facility];  Service:  Endoscopy;;  . SHOULDER ARTHROSCOPY Left   . TEE WITHOUT CARDIOVERSION N/A 11/11/2016   Procedure: TRANSESOPHAGEAL ECHOCARDIOGRAM (TEE);  Surgeon: Prescott Gum, Collier Salina, MD;  Location: [place];  Service: Open Heart Surgery;  Laterality: N/A;    Current Medications: Current Meds  Medication Sig  . acetaminophen (TYLENOL) 325 MG tablet Take 650 mg by mouth every 6 (six) hours as needed for mild pain.  . Ascorbic Acid (VITAMIN C) 500 MG CAPS Take 500 mg by mouth daily.  Marland Kitchen aspirin EC 81 MG tablet Take 81 mg by mouth daily.  . celecoxib (CELEBREX) 200 MG capsule Take 200 mg by mouth daily.  . cholecalciferol (VITAMIN D) 1000 units tablet Take 2,000 Units by mouth daily.  . Continuous Blood Gluc Sensor (FREESTYLE LIBRE SENSOR SYSTEM) MISC Use one sensor every 10 days.  . ferrous gluconate (FERGON) 240 (27 FE) MG tablet Take 240 mg by mouth daily.   Marland Kitchen FLUoxetine (PROZAC) 20 MG capsule Take 20 mg by mouth daily.  Marland Kitchen gabapentin (NEURONTIN) 300 MG capsule Take 600 mg by mouth at bedtime.   Marland Kitchen glucose blood (ONETOUCH VERIO) test strip USE AS DIRECTED FOUR- FIVE TIMES DAILY  . Insulin Glargine (BASAGLAR KWIKPEN) 100 UNIT/ML SOPN INJECT 80 UNITS INTO SKIN DAILY AT 10 PM (Patient taking differently: Inject 10 Units into the skin at bedtime as needed (Depending on sugar level). )  . Multiple Vitamin (MULTIVITAMIN) tablet Take 1 tablet by mouth daily.   . nitroGLYCERIN (NITROSTAT) 0.4 MG SL tablet Place 1 tablet (0.4 mg total) under the tongue every 5 (five) minutes as needed.  Glory Rosebush VERIO test strip USE TO TEST FOUR TIMES DAILY AS DIRECTED  . oxyCODONE-acetaminophen (PERCOCET/ROXICET) 5-325 MG tablet Take 1 tablet by mouth every 6 (six) hours as needed.  . pantoprazole (PROTONIX) 40 MG tablet TAKE 1 TABLET BY MOUTH DAILY BEFORE BREAKFAST  . Pediatric Multivitamins-Iron (FLINTSTONES COMPLETE) 18 MG CHEW Chew 1 tablet by mouth daily.   . simvastatin (ZOCOR) 10 MG tablet TAKE 1 TABLET BY MOUTH EVERY NIGHT AT  BEDTIME     Allergies:   Patient has no known allergies.   Social History   Socioeconomic History  . Marital status: Married    Spouse name: Not on file  . Number of children: Not on file  . Years of education: Not on file  . Highest education level: Not on file  Occupational History  . Not on file  Tobacco Use  . Smoking status: Former Smoker    Packs/day: 1.00    Years: 23.00    Pack years: 23.00    Types: Cigarettes    Start date: 10/30/1970    Quit date: 10/29/1993    Years since quitting: 26.3  . Smokeless tobacco: Former Systems developer    Types: Snuff, Chew  Vaping Use  . Vaping Use: Never used  Substance and Sexual Activity  . Alcohol use: No    Alcohol/week: 0.0 standard drinks  . Drug use:  No  . Sexual activity: Never    Birth control/protection: None  Other Topics Concern  . Not on file  Social History Narrative  . Not on file   Social Determinants of Health   Financial Resource Strain:   . Difficulty of Paying Living Expenses: Not on file  Food Insecurity:   . Worried About Charity fundraiser in the Last Year: Not on file  . Ran Out of Food in the Last Year: Not on file  Transportation Needs:   . Lack of Transportation (Medical): Not on file  . Lack of Transportation (Non-Medical): Not on file  Physical Activity:   . Days of Exercise per Week: Not on file  . Minutes of Exercise per Session: Not on file  Stress:   . Feeling of Stress : Not on file  Social Connections:   . Frequency of Communication with Friends and Family: Not on file  . Frequency of Social Gatherings with Friends and Family: Not on file  . Attends Religious Services: Not on file  . Active Member of Clubs or Organizations: Not on file  . Attends Archivist Meetings: Not on file  . Marital Status: Not on file     Family History: The patient's family history includes Diabetes in his father; Heart disease in his father; Hypertension in his mother; Obesity in his mother; Pancreatic  cancer in his maternal grandmother; Thyroid disease in his father. There is no history of Colon cancer.  ROS:   Please see the history of present illness.    All other systems reviewed and are negative.  EKGs/Labs/Other Studies Reviewed:    The following studies were reviewed today: ECG  02/10/2020 ECG personally reviewed    EKG:  The ekg ordered today demonstrates RBBB, LAFB, first degree AV delay  Recent Labs: 02/10/2020: ALT 28; BUN 17; Creatinine, Ser 0.86; Hemoglobin 13.0; Platelets 112; Potassium 4.1; Sodium 139; TSH 1.659  Recent Lipid Panel    Component Value Date/Time   CHOL 107 03/02/2017 1615   TRIG 164 (H) 03/02/2017 1615   HDL 39 (L) 03/02/2017 1615   CHOLHDL 2.7 03/02/2017 1615   VLDL 56 (H) 01/28/2016 1602   LDLCALC 44 03/02/2017 1615    Physical Exam:    VS:  BP 140/68   Pulse 63   Ht 6\' 3"  (1.905 m)   Wt 226 lb (102.5 kg)   SpO2 98%   BMI 28.25 kg/m     Wt Readings from Last 3 Encounters:  02/15/20 226 lb (102.5 kg)  02/10/20 217 lb (98.4 kg)  08/22/18 216 lb (98 kg)     GEN:  Well nourished, well developed in no acute distress HEENT: Normal NECK: No JVD; No carotid bruits LYMPHATICS: No lymphadenopathy CARDIAC: RRR, no murmurs, rubs, gallops RESPIRATORY:  Clear to auscultation without rales, wheezing or rhonchi  ABDOMEN: Soft, non-tender, non-distended MUSCULOSKELETAL:  No edema; No deformity  SKIN: Warm and dry NEUROLOGIC:  Alert and oriented x 3 PSYCHIATRIC:  Normal affect   ASSESSMENT:    1. Trifascicular block   2. Symptomatic bradycardia   3. Hx of bariatric surgery   4. Type 2 diabetes mellitus without complication, with long-term current use of insulin (HCC)    PLAN:    In order of problems listed above:  1. Symptomatic bradycardia/Trifascicular block He has episodes of symptomatic bradycardia and a baseline ECG demonstrating trifascicular block. I feel like Kelly [name] would benefit from a dual chamber permanent pacemaker. I  have discussed  the procedure with him and his wife in depth and they would like to proceed. Will check a surface echocardiogram prior to the procedure to assess for structural heart disease.   Risks, benefits, alternatives to PPM implantation were discussed in detail with the patient today. The patient understands that the risks include but are not limited to bleeding, infection, pneumothorax, perforation, tamponade, vascular damage, renal failure, MI, stroke, death, and lead dislodgement and wishes to proceed.  We will therefore schedule device implantation at the next available time.  We spent time today discussing possible interactions of welding equipment and the pacemaker. I discussed the case with one of the [employer] representatives who plans to do a site visit to Kelly [name]'s place of work to confirm appropriate programming to avoid EMI.  2. Hx of Bariatric Surgery Doing well post op. Has kept the weight off. Almost off all of his insulin.  3. DM His insulin requirements have decreased significantly.   Medication Adjustments/Labs and Tests Ordered: Current medicines are reviewed at length with the patient today.  Concerns regarding medicines are outlined above.  Orders Placed This Encounter  Procedures  . EKG 12-Lead  . ECHOCARDIOGRAM COMPLETE   No orders of the defined types were placed in this encounter.    Signed, Lars Mage, MD, [facility]  02/15/2020 7:45 PM    Electrophysiology [facility]     Anticoagulation instructions: The patient is not on anticoagulation.  Medication instructions morning of: The patient should hold ALL medications the morning of the procedure   Discharge: Our plan will be to discharge the patient same day after a period of observation

## 2020-02-15 ENCOUNTER — Encounter: Payer: Self-pay | Admitting: Cardiology

## 2020-02-15 ENCOUNTER — Ambulatory Visit (INDEPENDENT_AMBULATORY_CARE_PROVIDER_SITE_OTHER): Payer: BC Managed Care – PPO | Admitting: Cardiology

## 2020-02-15 ENCOUNTER — Other Ambulatory Visit: Payer: Self-pay

## 2020-02-15 VITALS — BP 140/68 | HR 63 | Ht 75.0 in | Wt 226.0 lb

## 2020-02-15 DIAGNOSIS — Z9884 Bariatric surgery status: Secondary | ICD-10-CM | POA: Diagnosis not present

## 2020-02-15 DIAGNOSIS — I453 Trifascicular block: Secondary | ICD-10-CM | POA: Diagnosis not present

## 2020-02-15 DIAGNOSIS — Z794 Long term (current) use of insulin: Secondary | ICD-10-CM

## 2020-02-15 DIAGNOSIS — E119 Type 2 diabetes mellitus without complications: Secondary | ICD-10-CM

## 2020-02-15 DIAGNOSIS — R001 Bradycardia, unspecified: Secondary | ICD-10-CM

## 2020-02-15 NOTE — Patient Instructions (Addendum)
Medication Instructions:  Your physician recommends that you continue on your current medications as directed. Please refer to the Current Medication list given to you today.  Labwork: None ordered.  Testing/Procedures: Your physician has requested that you have an echocardiogram. Echocardiography is a painless test that uses sound waves to create images of your heart. It provides your doctor with information about the size and shape of your heart and how well your hearts chambers and valves are working. This procedure takes approximately one hour. There are no restrictions for this procedure.  Please schedule for ECHO PRIOR to procedure on October 25  Follow-Up:  SEE INSTRUCTION LETTER  Any Other Special Instructions Will Be Listed Below (If Applicable).  If you need a refill on your cardiac medications before your next appointment, please call your pharmacy.    Pacemaker Implantation, Adult Pacemaker implantation is a procedure to place a pacemaker inside your chest. A pacemaker is a small computer that sends electrical signals to the heart and helps your heart beat normally. A pacemaker also stores information about your heart rhythms. You may need pacemaker implantation if you:  Have a slow heartbeat (bradycardia).  Faint (syncope).  Have shortness of breath (dyspnea) due to heart problems. The pacemaker attaches to your heart through a wire, called a lead. Sometimes just one lead is needed. Other times, there will be two leads. There are two types of pacemakers:  Transvenous pacemaker. This type is placed under the skin or muscle of your chest. The lead goes through a vein in the chest area to reach the inside of the heart.  Epicardial pacemaker. This type is placed under the skin or muscle of your chest or belly. The lead goes through your chest to the outside of the heart. Tell a health care provider about:  Any allergies you have.  All medicines you are taking, including  vitamins, herbs, eye drops, creams, and over-the-counter medicines.  Any problems you or family members have had with anesthetic medicines.  Any blood or bone disorders you have.  Any surgeries you have had.  Any medical conditions you have.  Whether you are pregnant or may be pregnant. What are the risks? Generally, this is a safe procedure. However, problems may occur, including:  Infection.  Bleeding.  Failure of the pacemaker or the lead.  Collapse of a lung or bleeding into a lung.  Blood clot inside a blood vessel with a lead.  Damage to the heart.  Infection inside the heart (endocarditis).  Allergic reactions to medicines. What happens before the procedure? Staying hydrated Follow instructions from your health care provider about hydration, which may include:  Up to 2 hours before the procedure - you may continue to drink clear liquids, such as water, clear fruit juice, black coffee, and plain tea. Eating and drinking restrictions Follow instructions from your health care provider about eating and drinking, which may include:  8 hours before the procedure - stop eating heavy meals or foods such as meat, fried foods, or fatty foods.  6 hours before the procedure - stop eating light meals or foods, such as toast or cereal.  6 hours before the procedure - stop drinking milk or drinks that contain milk.  2 hours before the procedure - stop drinking clear liquids. Medicines  Ask your health care provider about: ? Changing or stopping your regular medicines. This is especially important if you are taking diabetes medicines or blood thinners. ? Taking medicines such as aspirin and ibuprofen. These  medicines can thin your blood. Do not take these medicines before your procedure if your health care provider instructs you not to.  You may be given antibiotic medicine to help prevent infection. General instructions  You will have a heart evaluation. This may include  an electrocardiogram (ECG), chest X-ray, and heart imaging (echocardiogram,  or echo) tests.  You will have blood tests.  Do not use any products that contain nicotine or tobacco, such as cigarettes and e-cigarettes. If you need help quitting, ask your health care provider.  Plan to have someone take you home from the hospital or clinic.  If you will be going home right after the procedure, plan to have someone with you for 24 hours.  Ask your health care provider how your surgical site will be marked or identified. What happens during the procedure?  To reduce your risk of infection: ? Your health care team will wash or sanitize their hands. ? Your skin will be washed with soap. ? Hair may be removed from the surgical area.  An IV tube will be inserted into one of your veins.  You will be given one or more of the following: ? A medicine to help you relax (sedative). ? A medicine to numb the area (local anesthetic). ? A medicine to make you fall asleep (general anesthetic).  If you are getting a transvenous pacemaker: ? An incision will be made in your upper chest. ? A pocket will be made for the pacemaker. It may be placed under the skin or between layers of muscle. ? The lead will be inserted into a blood vessel that returns to the heart. ? While X-rays are taken by an imaging machine (fluoroscopy), the lead will be advanced through the vein to the inside of your heart. ? The other end of the lead will be tunneled under the skin and attached to the pacemaker.  If you are getting an epicardial pacemaker: ? An incision will be made near your ribs or breastbone (sternum) for the lead. ? The lead will be attached to the outside of your heart. ? Another incision will be made in your chest or upper belly to create a pocket for the pacemaker. ? The free end of the lead will be tunneled under the skin and attached to the pacemaker.  The transvenous or epicardial pacemaker will be  tested. Imaging studies may be done to check the lead position.  The incisions will be closed with stitches (sutures), adhesive strips, or skin glue.  Bandages (dressing) will be placed over the incisions. The procedure may vary among health care providers and hospitals. What happens after the procedure?  Your blood pressure, heart rate, breathing rate, and blood oxygen level will be monitored until the medicines you were given have worn off.  You will be given antibiotics and pain medicine.  ECG and chest x-rays will be done.  You will wear a continuous type of ECG (Holter monitor) to check your heart rhythm.  Your health care provider will program the pacemaker.  Do not drive for 24 hours if you received a sedative. This information is not intended to replace advice given to you by your health care provider. Make sure you discuss any questions you have with your health care provider. Document Revised: 01/22/2018 Document Reviewed: 10/17/2015 Elsevier Patient Education  [place].

## 2020-02-21 ENCOUNTER — Other Ambulatory Visit: Payer: Self-pay

## 2020-02-21 ENCOUNTER — Ambulatory Visit (HOSPITAL_COMMUNITY)
Admission: RE | Admit: 2020-02-21 | Discharge: 2020-02-21 | Disposition: A | Discharge status: Home / Self Care | Payer: BC Managed Care – PPO | Source: Ambulatory Visit | Attending: Cardiology | Admitting: Cardiology

## 2020-02-21 DIAGNOSIS — R001 Bradycardia, unspecified: Secondary | ICD-10-CM | POA: Insufficient documentation

## 2020-02-21 DIAGNOSIS — I340 Nonrheumatic mitral (valve) insufficiency: Secondary | ICD-10-CM

## 2020-02-21 DIAGNOSIS — I361 Nonrheumatic tricuspid (valve) insufficiency: Secondary | ICD-10-CM

## 2020-02-21 DIAGNOSIS — I350 Nonrheumatic aortic (valve) stenosis: Secondary | ICD-10-CM | POA: Diagnosis not present

## 2020-02-21 LAB — ECHOCARDIOGRAM COMPLETE
AR max vel: 1.11 cm2
AV Area VTI: 1.14 cm2
AV Area mean vel: 1.08 cm2
AV Mean grad: 15.6 mmHg
AV Peak grad: 28.8 mmHg
Ao pk vel: 2.69 m/s
Area-P 1/2: 3.51 cm2
MV M vel: 5.88 m/s
MV Peak grad: 138.3 mmHg
S' Lateral: 4.15 cm

## 2020-02-21 NOTE — Progress Notes (Signed)
*  PRELIMINARY RESULTS* Echocardiogram 2D Echocardiogram has been performed.  Leavy Cella 02/21/2020, 3:51 PM

## 2020-02-23 ENCOUNTER — Telehealth: Payer: Self-pay | Admitting: Cardiology

## 2020-02-23 NOTE — Telephone Encounter (Signed)
Wife called in stated pt is going to have a pacemaker pt in on 10/25 and would like to know if Dr Quentin Ore thinks it would be ok for pt to get Flu before or should he wait til after ?   Best number [phone]

## 2020-02-23 NOTE — Telephone Encounter (Signed)
Returned call to pt's wife.  Advised ok to get flu shot either before or after procedure.

## 2020-03-09 ENCOUNTER — Other Ambulatory Visit (HOSPITAL_COMMUNITY)
Admission: RE | Admit: 2020-03-09 | Discharge: 2020-03-09 | Disposition: A | Discharge status: Home / Self Care | Payer: BC Managed Care – PPO | Source: Ambulatory Visit | Attending: Cardiology | Admitting: Cardiology

## 2020-03-09 DIAGNOSIS — Z01812 Encounter for preprocedural laboratory examination: Secondary | ICD-10-CM | POA: Diagnosis present

## 2020-03-09 DIAGNOSIS — Z20822 Contact with and (suspected) exposure to covid-19: Secondary | ICD-10-CM | POA: Insufficient documentation

## 2020-03-09 LAB — SARS CORONAVIRUS 2 (TAT 6-24 HRS): SARS Coronavirus 2: NEGATIVE

## 2020-03-09 NOTE — Progress Notes (Signed)
Instructed patient on the following items: Arrival time 0730 Nothing to eat or drink after midnight No meds AM of procedure Responsible person to drive you home and stay with you for 24 hrs Wash with special soap night before and morning of procedure  

## 2020-03-12 ENCOUNTER — Ambulatory Visit (HOSPITAL_COMMUNITY)
Admission: RE | Disposition: A | Discharge status: Home / Self Care | Payer: Self-pay | Source: Home / Self Care | Attending: Cardiology

## 2020-03-12 ENCOUNTER — Other Ambulatory Visit: Payer: Self-pay

## 2020-03-12 ENCOUNTER — Ambulatory Visit (HOSPITAL_COMMUNITY)
Admission: RE | Admit: 2020-03-12 | Discharge: 2020-03-12 | Disposition: A | Discharge status: Home / Self Care | Payer: BC Managed Care – PPO | Attending: Cardiology | Admitting: Cardiology

## 2020-03-12 ENCOUNTER — Ambulatory Visit (HOSPITAL_COMMUNITY): Payer: BC Managed Care – PPO

## 2020-03-12 DIAGNOSIS — I453 Trifascicular block: Secondary | ICD-10-CM | POA: Insufficient documentation

## 2020-03-12 DIAGNOSIS — E785 Hyperlipidemia, unspecified: Secondary | ICD-10-CM | POA: Diagnosis not present

## 2020-03-12 DIAGNOSIS — Z951 Presence of aortocoronary bypass graft: Secondary | ICD-10-CM | POA: Diagnosis not present

## 2020-03-12 DIAGNOSIS — Z7982 Long term (current) use of aspirin: Secondary | ICD-10-CM | POA: Diagnosis not present

## 2020-03-12 DIAGNOSIS — Z950 Presence of cardiac pacemaker: Secondary | ICD-10-CM

## 2020-03-12 DIAGNOSIS — I2510 Atherosclerotic heart disease of native coronary artery without angina pectoris: Secondary | ICD-10-CM | POA: Insufficient documentation

## 2020-03-12 DIAGNOSIS — E669 Obesity, unspecified: Secondary | ICD-10-CM | POA: Insufficient documentation

## 2020-03-12 DIAGNOSIS — I442 Atrioventricular block, complete: Secondary | ICD-10-CM | POA: Diagnosis not present

## 2020-03-12 DIAGNOSIS — Z79899 Other long term (current) drug therapy: Secondary | ICD-10-CM | POA: Diagnosis not present

## 2020-03-12 DIAGNOSIS — F419 Anxiety disorder, unspecified: Secondary | ICD-10-CM | POA: Insufficient documentation

## 2020-03-12 DIAGNOSIS — K219 Gastro-esophageal reflux disease without esophagitis: Secondary | ICD-10-CM | POA: Insufficient documentation

## 2020-03-12 DIAGNOSIS — E1140 Type 2 diabetes mellitus with diabetic neuropathy, unspecified: Secondary | ICD-10-CM | POA: Insufficient documentation

## 2020-03-12 DIAGNOSIS — I252 Old myocardial infarction: Secondary | ICD-10-CM | POA: Diagnosis not present

## 2020-03-12 DIAGNOSIS — I10 Essential (primary) hypertension: Secondary | ICD-10-CM | POA: Insufficient documentation

## 2020-03-12 DIAGNOSIS — Z794 Long term (current) use of insulin: Secondary | ICD-10-CM | POA: Insufficient documentation

## 2020-03-12 DIAGNOSIS — Z9884 Bariatric surgery status: Secondary | ICD-10-CM | POA: Insufficient documentation

## 2020-03-12 DIAGNOSIS — Z0279 Encounter for issue of other medical certificate: Secondary | ICD-10-CM

## 2020-03-12 DIAGNOSIS — Z87891 Personal history of nicotine dependence: Secondary | ICD-10-CM | POA: Diagnosis not present

## 2020-03-12 DIAGNOSIS — R001 Bradycardia, unspecified: Secondary | ICD-10-CM | POA: Insufficient documentation

## 2020-03-12 DIAGNOSIS — Z6826 Body mass index (BMI) 26.0-26.9, adult: Secondary | ICD-10-CM | POA: Diagnosis not present

## 2020-03-12 HISTORY — PX: PACEMAKER IMPLANT: EP1218

## 2020-03-12 LAB — CBC
HCT: 36.2 % — ABNORMAL LOW (ref 39.0–52.0)
Hemoglobin: 12.2 g/dL — ABNORMAL LOW (ref 13.0–17.0)
MCH: 32.2 pg (ref 26.0–34.0)
MCHC: 33.7 g/dL (ref 30.0–36.0)
MCV: 95.5 fL (ref 80.0–100.0)
Platelets: 114 10*3/uL — ABNORMAL LOW (ref 150–400)
RBC: 3.79 MIL/uL — ABNORMAL LOW (ref 4.22–5.81)
RDW: 11.7 % (ref 11.5–15.5)
WBC: 5.4 10*3/uL (ref 4.0–10.5)
nRBC: 0 % (ref 0.0–0.2)

## 2020-03-12 LAB — BASIC METABOLIC PANEL
Anion gap: 9 (ref 5–15)
BUN: 11 mg/dL (ref 8–23)
CO2: 27 mmol/L (ref 22–32)
Calcium: 9.3 mg/dL (ref 8.9–10.3)
Chloride: 104 mmol/L (ref 98–111)
Creatinine, Ser: 0.99 mg/dL (ref 0.61–1.24)
GFR, Estimated: >60 mL/min (ref >60)
Glucose, Bld: 128 mg/dL — ABNORMAL HIGH (ref 70–99)
Potassium: 3.9 mmol/L (ref 3.5–5.1)
Sodium: 140 mmol/L (ref 135–145)

## 2020-03-12 LAB — GLUCOSE, CAPILLARY
Glucose-Capillary: 119 mg/dL — ABNORMAL HIGH (ref 70–99)
Glucose-Capillary: 113 mg/dL — ABNORMAL HIGH (ref 70–99)

## 2020-03-12 SURGERY — PACEMAKER IMPLANT

## 2020-03-12 MED ORDER — HEPARIN (PORCINE) IN NACL 1000-0.9 UT/500ML-% IV SOLN
INTRAVENOUS | Status: AC
  Filled 2020-03-12: qty 500

## 2020-03-12 MED ORDER — SODIUM CHLORIDE 0.9 % IV SOLN
INTRAVENOUS | Status: AC
  Filled 2020-03-12: qty 2

## 2020-03-12 MED ORDER — HEPARIN (PORCINE) IN NACL 1000-0.9 UT/500ML-% IV SOLN
INTRAVENOUS | Status: DC | PRN
  Administered 2020-03-12: 500 mL

## 2020-03-12 MED ORDER — ACETAMINOPHEN 325 MG PO TABS
325.0000 mg | ORAL_TABLET | ORAL | Status: DC | PRN
  Administered 2020-03-12: 650 mg via ORAL
  Filled 2020-03-12: qty 2

## 2020-03-12 MED ORDER — CEFAZOLIN SODIUM-DEXTROSE 2-4 GM/100ML-% IV SOLN
2.0000 g | INTRAVENOUS | Status: AC
  Administered 2020-03-12: 2 g via INTRAVENOUS

## 2020-03-12 MED ORDER — CEFAZOLIN SODIUM-DEXTROSE 2-4 GM/100ML-% IV SOLN
INTRAVENOUS | Status: AC
  Filled 2020-03-12: qty 100

## 2020-03-12 MED ORDER — MIDAZOLAM HCL 5 MG/5ML IJ SOLN
INTRAMUSCULAR | Status: DC | PRN
  Administered 2020-03-12 (×3): 1 mg via INTRAVENOUS

## 2020-03-12 MED ORDER — FENTANYL CITRATE (PF) 100 MCG/2ML IJ SOLN
INTRAMUSCULAR | Status: AC
Start: 2020-03-12 — End: ?
  Filled 2020-03-12: qty 2

## 2020-03-12 MED ORDER — CHLORHEXIDINE GLUCONATE 4 % EX LIQD: 4.0000 "application " | Freq: Once | CUTANEOUS | Status: DC

## 2020-03-12 MED ORDER — SODIUM CHLORIDE 0.9 % IV SOLN
80.0000 mg | INTRAVENOUS | Status: AC
  Administered 2020-03-12: 80 mg

## 2020-03-12 MED ORDER — ASPIRIN EC 81 MG PO TBEC: 81.0000 mg | DELAYED_RELEASE_TABLET | Freq: Every day | ORAL | 11 refills | Status: AC

## 2020-03-12 MED ORDER — SODIUM CHLORIDE 0.9 % IV SOLN: INTRAVENOUS | Status: DC

## 2020-03-12 MED ORDER — MIDAZOLAM HCL 5 MG/5ML IJ SOLN
INTRAMUSCULAR | Status: AC
  Filled 2020-03-12: qty 5

## 2020-03-12 MED ORDER — LIDOCAINE HCL (PF) 1 % IJ SOLN
INTRAMUSCULAR | Status: AC
  Filled 2020-03-12: qty 60

## 2020-03-12 MED ORDER — FENTANYL CITRATE (PF) 100 MCG/2ML IJ SOLN
INTRAMUSCULAR | Status: DC | PRN
Start: 2020-03-12 — End: 2020-03-12
  Administered 2020-03-12 (×3): 25 ug via INTRAVENOUS

## 2020-03-12 MED ORDER — LIDOCAINE HCL (PF) 1 % IJ SOLN
INTRAMUSCULAR | Status: DC | PRN
  Administered 2020-03-12: 50 mL

## 2020-03-12 SURGICAL SUPPLY — 8 items
CABLE SURGICAL S-101-97-12 (CABLE) ×3 IMPLANT
COVER DOME SNAP 22 D (MISCELLANEOUS) ×2 IMPLANT
LEAD TENDRIL MRI 52CM LPA1200M (Lead) ×2 IMPLANT
LEAD TENDRIL MRI 58CM LPA1200M (Lead) ×2 IMPLANT
PACEMAKER ASSURITY DR-RF (Pacemaker) ×2 IMPLANT
PAD PRO RADIOLUCENT 2001M-C (PAD) ×3 IMPLANT
SHEATH 8FR PRELUDE SNAP 13 (SHEATH) ×4 IMPLANT
TRAY PACEMAKER INSERTION (PACKS) ×3 IMPLANT

## 2020-03-12 NOTE — Progress Notes (Signed)
Patient taken to radiology for chest xray as ordered.

## 2020-03-12 NOTE — Discharge Instructions (Signed)
After Your Pacemaker   Supplemental Discharge Instructions for  Pacemaker/Defibrillator Patients  Tomorrow, 03/13/2020, send in a device transmission  Activity No heavy lifting or vigorous activity with your left/right arm for 6 to 8 weeks.  Do not raise your left/right arm above your head for one week.  Gradually raise your affected arm as drawn below.         03/16/2020              03/17/2020              03/18/2020            03/19/2020  NO DRIVING for  1 week   ; you may begin driving on 69/10/2950 .    WOUND CARE - Keep the wound area clean and dry.  Do not get this area wet , no showers for one week; you may shower on  03/19/2020  . - Tomorrow, 03/13/2020, remove the arm sling - Tomorrow, 03/13/2020,  remove the outer plastic bandage.  Underneath the plastic bandage there are steris strips (paper tapes), DO NOT remove these. - The tape/steri-strips on your wound will fall off; do not pull them off.  No bandage is needed on the site.  DO  NOT apply any creams, oils, or ointments to the wound area. - If you notice any drainage or discharge from the wound, any swelling or bruising at the site, or you develop a fever > 101? F after you are discharged home, call the office at once.  Special Instructions - You are still able to use cellular telephones; use the ear opposite the side where you have your pacemaker/defibrillator.  Avoid carrying your cellular phone near your device. - When traveling through airports, show security personnel your identification card to avoid being screened in the metal detectors.  Ask the security personnel to use the hand wand. - Avoid arc welding equipment, MRI testing (magnetic resonance imaging), TENS units (transcutaneous nerve stimulators).  Call the office for questions about other devices. - Avoid electrical appliances that are in poor condition or are not properly grounded. - Microwave ovens are safe to be near or to operate.     . You have a  [employer] Pacemaker    You may safely use electric blankets, heating pads, computers, and microwave ovens.  Call the office right away if:  You have chest pain.  You feel more short of breath than you have felt before.  You feel more light-headed than you have felt before.  Your incision starts to open up.  This information is not intended to replace advice given to you by your health care provider. Make sure you discuss any questions you have with your health care provider.

## 2020-03-12 NOTE — Interval H&P Note (Signed)
History and Physical Interval Note:  03/12/2020 8:44 AM  Kelly [name]  has presented today for surgery, with the diagnosis of bradicardia.  The various methods of treatment have been discussed with the patient and family. After consideration of risks, benefits and other options for treatment, the patient has consented to  Procedure(s): PACEMAKER IMPLANT (N/A) as a surgical intervention.  The patient's history has been reviewed, patient examined, no change in status, stable for surgery.  I have reviewed the patient's chart and labs.  Questions were answered to the patient's satisfaction.     [name] [name]

## 2020-03-12 NOTE — Progress Notes (Signed)
Dr Lambert in to see pt.  

## 2020-03-13 ENCOUNTER — Telehealth: Payer: Self-pay | Admitting: Cardiology

## 2020-03-13 ENCOUNTER — Encounter (HOSPITAL_COMMUNITY): Payer: Self-pay | Admitting: Cardiology

## 2020-03-13 MED FILL — Gentamicin Sulfate Inj 40 MG/ML: INTRAMUSCULAR | Qty: 80 | Status: AC

## 2020-03-13 MED FILL — Cefazolin Sodium-Dextrose IV Solution 2 GM/100ML-4%: INTRAVENOUS | Qty: 100 | Status: AC

## 2020-03-13 NOTE — Telephone Encounter (Signed)
[facility] received a form from the patient for short term disability. Placed in Dr. Mardene Speak box for review & completion. 03/13/20  [facility]

## 2020-03-19 ENCOUNTER — Telehealth: Payer: Self-pay | Admitting: Cardiology

## 2020-03-19 NOTE — Telephone Encounter (Signed)
Returning patients phone call.  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 Would advised patient to contact [employer] for clarification at [phone].

## 2020-03-19 NOTE — Telephone Encounter (Signed)
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 They would like to know if anything had been found out because he is supposed to go back to work on 03/26/20. Please advise.

## 2020-03-22 ENCOUNTER — Other Ambulatory Visit: Payer: Self-pay

## 2020-03-22 ENCOUNTER — Ambulatory Visit (INDEPENDENT_AMBULATORY_CARE_PROVIDER_SITE_OTHER): Payer: BC Managed Care – PPO | Admitting: Emergency Medicine

## 2020-03-22 DIAGNOSIS — I453 Trifascicular block: Secondary | ICD-10-CM | POA: Diagnosis not present

## 2020-03-22 LAB — CUP PACEART INCLINIC DEVICE CHECK
Battery Remaining Longevity: 52 mo
Battery Voltage: 3.05 V
Brady Statistic RA Percent Paced: 52 %
Brady Statistic RV Percent Paced: 99.69 %
Date Time Interrogation Session: 20211104131920
Implantable Lead Implant Date: 20211025
Implantable Lead Implant Date: 20211025
Implantable Lead Location: 753859
Implantable Lead Location: 753860
Implantable Pulse Generator Implant Date: 20211025
Lead Channel Impedance Value: 462.5 Ohm
Lead Channel Impedance Value: 462.5 Ohm
Lead Channel Pacing Threshold Amplitude: 0.5 V
Lead Channel Pacing Threshold Amplitude: 0.5 V
Lead Channel Pacing Threshold Amplitude: 1.5 V
Lead Channel Pacing Threshold Amplitude: 1.5 V
Lead Channel Pacing Threshold Pulse Width: 0.5 ms
Lead Channel Pacing Threshold Pulse Width: 0.5 ms
Lead Channel Pacing Threshold Pulse Width: 1 ms
Lead Channel Pacing Threshold Pulse Width: 1 ms
Lead Channel Sensing Intrinsic Amplitude: 1.2 mV
Lead Channel Sensing Intrinsic Amplitude: 9 mV
Lead Channel Setting Pacing Amplitude: 3.5 V
Lead Channel Setting Pacing Amplitude: 3.5 V
Lead Channel Setting Pacing Pulse Width: 0.5 ms
Lead Channel Setting Sensing Sensitivity: 2 mV
Pulse Gen Model: 2272
Pulse Gen Serial Number: 3873891

## 2020-03-22 NOTE — Progress Notes (Signed)
Wound check appointment. Steri-strips removed. Wound without redness or edema. Incision edges approximated, wound well healed. Normal device function. Sensing, and impedances consistent with implant measurements. RA thresholds continue elevated, implant levels were 2.1@0 .4, todays results were 2.25@ 0.5, 1.75 @0 .8 and 1.5@ 1.0.  RA output programmed to 3.5v@ 1.69ms.  Device programmed at 3.5V for extra safety margin until 3 month visit. Histogram distribution appropriate for patient and level of activity. No mode switches or high ventricular rates noted. Patient educated about wound care, arm mobility, lifting restrictions. Patient is enrolled in remote monitoring, next scheduled check 06/12/20, ROV with Dr. Quentin Ore 06/18/20.

## 2020-03-23 NOTE — Telephone Encounter (Signed)
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

## 2020-03-28 ENCOUNTER — Encounter (HOSPITAL_COMMUNITY): Payer: Self-pay | Admitting: Emergency Medicine

## 2020-03-28 ENCOUNTER — Other Ambulatory Visit: Payer: Self-pay

## 2020-03-28 ENCOUNTER — Emergency Department (HOSPITAL_COMMUNITY)
Admission: EM | Admit: 2020-03-28 | Discharge: 2020-03-28 | Disposition: A | Discharge status: Home / Self Care | Payer: BC Managed Care – PPO | Attending: Emergency Medicine | Admitting: Emergency Medicine

## 2020-03-28 ENCOUNTER — Emergency Department (HOSPITAL_COMMUNITY): Payer: BC Managed Care – PPO

## 2020-03-28 DIAGNOSIS — Z950 Presence of cardiac pacemaker: Secondary | ICD-10-CM | POA: Insufficient documentation

## 2020-03-28 DIAGNOSIS — I2510 Atherosclerotic heart disease of native coronary artery without angina pectoris: Secondary | ICD-10-CM | POA: Insufficient documentation

## 2020-03-28 DIAGNOSIS — I10 Essential (primary) hypertension: Secondary | ICD-10-CM | POA: Diagnosis not present

## 2020-03-28 DIAGNOSIS — Z87891 Personal history of nicotine dependence: Secondary | ICD-10-CM | POA: Diagnosis not present

## 2020-03-28 DIAGNOSIS — R079 Chest pain, unspecified: Secondary | ICD-10-CM | POA: Diagnosis present

## 2020-03-28 DIAGNOSIS — Z794 Long term (current) use of insulin: Secondary | ICD-10-CM | POA: Diagnosis not present

## 2020-03-28 DIAGNOSIS — Z955 Presence of coronary angioplasty implant and graft: Secondary | ICD-10-CM | POA: Diagnosis not present

## 2020-03-28 DIAGNOSIS — E119 Type 2 diabetes mellitus without complications: Secondary | ICD-10-CM | POA: Diagnosis not present

## 2020-03-28 LAB — CBC
HCT: 36.4 % — ABNORMAL LOW (ref 39.0–52.0)
Hemoglobin: 12.1 g/dL — ABNORMAL LOW (ref 13.0–17.0)
MCH: 32.6 pg (ref 26.0–34.0)
MCHC: 33.2 g/dL (ref 30.0–36.0)
MCV: 98.1 fL (ref 80.0–100.0)
Platelets: 108 10*3/uL — ABNORMAL LOW (ref 150–400)
RBC: 3.71 MIL/uL — ABNORMAL LOW (ref 4.22–5.81)
RDW: 11.9 % (ref 11.5–15.5)
WBC: 4.9 10*3/uL (ref 4.0–10.5)
nRBC: 0 % (ref 0.0–0.2)

## 2020-03-28 LAB — TROPONIN I (HIGH SENSITIVITY)
Troponin I (High Sensitivity): 7 ng/L (ref <18)
Troponin I (High Sensitivity): 11 ng/L (ref <18)

## 2020-03-28 LAB — BASIC METABOLIC PANEL
Anion gap: 8 (ref 5–15)
BUN: 18 mg/dL (ref 8–23)
CO2: 29 mmol/L (ref 22–32)
Calcium: 9 mg/dL (ref 8.9–10.3)
Chloride: 100 mmol/L (ref 98–111)
Creatinine, Ser: 0.92 mg/dL (ref 0.61–1.24)
GFR, Estimated: >60 mL/min (ref >60)
Glucose, Bld: 180 mg/dL — ABNORMAL HIGH (ref 70–99)
Potassium: 4.2 mmol/L (ref 3.5–5.1)
Sodium: 137 mmol/L (ref 135–145)

## 2020-03-28 NOTE — ED Triage Notes (Signed)
Pt arrived by [facility]. Pt c/o chest pain that was relived with aspirin and rest today at work. Pt had a pace maker placed x2 weeks ago.

## 2020-03-28 NOTE — ED Provider Notes (Signed)
[facility] EMERGENCY DEPARTMENT Provider Note   CSN: [id number] Arrival date & time: 03/28/20  1006     History Chief Complaint  Patient presents with  . Chest Pain    Kelly [name] is a 64 y.o. male.  64 year old male with past medical history coronary artery disease (CABG x3), diabetes, pacemaker insertion 2 weeks ago, brought in by EMS for chest pain.  Patient states that he was at work today, had just returned from break when he developed sharp chest discomfort, slight discomfort radiating down right arm.  Patient went to the office to get checked on and states pain improved with rest, lasted approximately 15 to 20 minutes, has since resolved, given 324mg  ASA, no nitro.  Not associated with shortness of breath, diaphoresis, nausea.  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  Each time, pain resolved with rest.        Past Medical History:  Diagnosis Date  . Anxiety   . Arthritis   . Coronary atherosclerosis of native coronary artery    BMS LAD and PTCA RCA 2000,BMS RCA/PLA 2001, CABG June 2018  . Diverticulitis   . Essential hypertension   . Fatty liver   . Gastric ulcer   . GERD (gastroesophageal reflux disease)   . Hyperlipidemia   . Low-grade NHL (non-Hodgkin's lymphoma) 2007  . Lupus anticoagulant positive   . MI (myocardial infarction) ([place]) 2001  . Peripheral neuropathy   . Type 2 diabetes mellitus ([place])   . Umbilical hernia     Patient Active Problem List   Diagnosis Date Noted  . Symptomatic bradycardia 02/15/2020  . Dehiscence of external surgical wound 11/26/2016  . 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 Coronary artery disease 11/11/2016  . Accelerating angina ([place]) 09/26/2016  . Morbid obesity due to excess calories ([place]) 11/09/2015  . Colon cancer screening 12/28/2014  . Dyspepsia, NEW ONSET 07/26/2014  . Hepatic cirrhosis  ([place]) 05/07/2014  . Low-grade NHL (non-Hodgkin's lymphoma) 05/16/2011  . Lupus anticoagulant positive 05/16/2011  . Gastric ulcer 05/16/2011  . Type 2 diabetes mellitus with vascular disease ([place]) 05/22/2009  . Mixed hyperlipidemia 05/22/2009  . Essential hypertension 05/22/2009  . CORONARY ATHEROSCLEROSIS NATIVE CORONARY ARTERY 05/22/2009    Past Surgical History:  Procedure Laterality Date  . Axillary abscess     left incision and drainage left  . BACK SURGERY     multiple  . BIOPSY  08/15/2014   Procedure: BIOPSY;  Surgeon: Danie Binder, MD;  Location: [facility];  Service: Endoscopy;;  Gastric  . BIOPSY  01/19/2015   Procedure: BIOPSY (GASTRIC ULCER);  Surgeon: Danie Binder, MD;  Location: [facility];  Service: Endoscopy;;  . CARDIAC CATHETERIZATION    . cardiac stents     x5 stents  . COLONOSCOPY  June 2008   Dr. Dellis Filbert Medoff: Mild left colonic diverticulosis  . CORONARY ARTERY BYPASS GRAFT N/A 11/11/2016   Procedure: CORONARY ARTERY BYPASS GRAFTING (CABG) x 3, USING LEFT MAMMARY ARTERY AND RIGHT GREATER SAPHENOUS VEIN HARVESTED ENDOSCOPICALLY;  Surgeon: Ivin Poot, MD;  Location: [place];  Service: Open Heart Surgery;  Laterality: N/A;  . Dental extractions    . ESOPHAGOGASTRODUODENOSCOPY  June 2008   Dr. Dellis Filbert Medoff: Gastric ulcer, antral, biopsy with reactive changes associated glandular atrophy, no H pylori. No features of lymphoma. Likely NSAID  related  . ESOPHAGOGASTRODUODENOSCOPY  August 2008   Dr. Dellis Filbert Medoff: Complete healing of gastric ulcers.  . ESOPHAGOGASTRODUODENOSCOPY (EGD) WITH PROPOFOL N/A 08/15/2014   SLF: 1. Abnormal pain due to ulcers & gastriitis   . ESOPHAGOGASTRODUODENOSCOPY (EGD) WITH PROPOFOL N/A 01/19/2015   SLF: 1. Persistant ulcer with firm base 2. single gastric polyp found in the gastric antrum. Ulcer base   . EUS N/A 03/21/2015   Thickened gastric wall in pre-pyloric antrum, no obvious intramural mass, fatty pancreas, no obvious pancreatic  mass.   . Inguinal lymph node biopsy     right inguinal lymph node biopsy  . INTRAVASCULAR PRESSURE WIRE/FFR STUDY N/A 09/26/2016   Procedure: Intravascular Pressure Wire/FFR Study;  Surgeon: Nelva Bush, MD;  Location: [place] [facility];  Service: Cardiovascular;  Laterality: N/A;  . LEFT HEART CATH AND CORONARY ANGIOGRAPHY N/A 09/26/2016   Procedure: Left Heart Cath and Coronary Angiography;  Surgeon: Nelva Bush, MD;  Location: [place] [facility];  Service: Cardiovascular;  Laterality: N/A;  . NECK SURGERY    . PACEMAKER IMPLANT N/A 03/12/2020   Procedure: PACEMAKER IMPLANT;  Surgeon: Vickie Epley, MD;  Location: [place] [facility];  Service: Cardiovascular;  Laterality: N/A;  . POLYPECTOMY  01/19/2015   Procedure: POLYPECTOMY (GASTRIC);  Surgeon: Danie Binder, MD;  Location: [facility];  Service: Endoscopy;;  . SHOULDER ARTHROSCOPY Left   . TEE WITHOUT CARDIOVERSION N/A 11/11/2016   Procedure: TRANSESOPHAGEAL ECHOCARDIOGRAM (TEE);  Surgeon: Prescott Gum, Collier Salina, MD;  Location: [place];  Service: Open Heart Surgery;  Laterality: N/A;       Family History  Problem Relation Age of Onset  . Pancreatic cancer Maternal Grandmother   . Obesity Mother   . Hypertension Mother   . Diabetes Father   . Thyroid disease Father   . Heart disease Father   . Colon cancer Neg Hx     Social History   Tobacco Use  . Smoking status: Former Smoker    Packs/day: 1.00    Years: 23.00    Pack years: 23.00    Types: Cigarettes    Start date: 10/30/1970    Quit date: 10/29/1993    Years since quitting: 26.4  . Smokeless tobacco: Former Systems developer    Types: Snuff, Chew  Vaping Use  . Vaping Use: Never used  Substance Use Topics  . Alcohol use: No    Alcohol/week: 0.0 standard drinks  . Drug use: No    Home Medications Prior to Admission medications   Medication Sig Start Date End Date Taking? Authorizing Provider  acetaminophen (TYLENOL) 500 MG tablet Take 500-1,000 mg by mouth every 6  (six) hours as needed (for pain.).   Yes [provider]  Ascorbic Acid (VITAMIN C) 500 MG CAPS Take 500 mg by mouth daily.   Yes [provider]  aspirin EC 81 MG tablet Take 1 tablet (81 mg total) by mouth daily. 03/16/20  Yes Vickie Epley, MD  B Complex-C (B-COMPLEX WITH VITAMIN C) tablet Take 1 tablet by mouth daily.   Yes [provider]  celecoxib (CELEBREX) 200 MG capsule Take 200 mg by mouth daily. 01/09/20  Yes [provider]  Cholecalciferol (VITAMIN D3) 50 MCG (2000 UT) TABS Take 2,000 Units by mouth daily.   Yes [provider]  ferrous sulfate 325 (65 FE) MG tablet Take 325 mg by mouth daily.   Yes [provider]  FLUoxetine (PROZAC) 20 MG capsule Take 20 mg by mouth daily.  Yes [provider]  gabapentin (NEURONTIN) 600 MG tablet Take 600 mg by mouth at bedtime.   Yes [provider]  Insulin Glargine (BASAGLAR KWIKPEN) 100 UNIT/ML SOPN INJECT 80 UNITS INTO SKIN DAILY AT 10 PM Patient taking differently: Inject 10 Units into the skin at bedtime as needed (Depending on sugar level).  10/20/17  Yes Cassandria Anger, MD  Multiple Vitamin (MULTIVITAMIN WITH MINERALS) TABS tablet Take 1 tablet by mouth in the morning and at bedtime.   Yes [provider]  oxyCODONE (OXY IR/ROXICODONE) 5 MG immediate release tablet Take 5-10 mg by mouth 2 (two) times daily as needed (pain.).  02/21/20  Yes [provider]  pantoprazole (PROTONIX) 40 MG tablet TAKE 1 TABLET BY MOUTH DAILY BEFORE BREAKFAST Patient taking differently: Take 40 mg by mouth daily before breakfast.  09/15/19  Yes Erenest Rasher, PA-C  Pediatric Multivitamins-Iron [facility] COMPLETE PO) Take 1 tablet by mouth daily.   Yes [provider]  simvastatin (ZOCOR) 10 MG tablet TAKE 1 TABLET BY MOUTH EVERY NIGHT AT BEDTIME Patient taking differently: Take 10 mg by mouth at bedtime.  04/29/12  Yes Satira Sark, MD    Continuous Blood Gluc Sensor (FREESTYLE LIBRE SENSOR SYSTEM) MISC Use one sensor every 10 days. 03/06/17   Cassandria Anger, MD  glucose blood (ONETOUCH VERIO) test strip USE AS DIRECTED FOUR- FIVE TIMES DAILY 09/18/17   Cassandria Anger, MD  nitroGLYCERIN (NITROSTAT) 0.4 MG SL tablet Place 1 tablet (0.4 mg total) under the tongue every 5 (five) minutes as needed. Patient taking differently: Place 0.4 mg under the tongue every 5 (five) minutes x 3 doses as needed for chest pain.  06/01/15   Satira Sark, MD  [facility] VERIO test strip USE TO TEST FOUR TIMES DAILY AS DIRECTED 12/22/17   Cassandria Anger, MD    Allergies    Patient has no known allergies.  Review of Systems   Review of Systems  Constitutional: Negative for fever.  Respiratory: Negative for shortness of breath.   Cardiovascular: Positive for chest pain.  Gastrointestinal: Negative for abdominal pain, nausea and vomiting.  Musculoskeletal: Negative for back pain.  Skin: Negative for rash and wound.  Allergic/Immunologic: Positive for immunocompromised state.  Neurological: Negative for weakness.  Psychiatric/Behavioral: Negative for confusion.  All other systems reviewed and are negative.   Physical Exam Updated Vital Signs BP (!) 148/69 (BP Location: Left Arm)   Pulse 61   Temp 97.6 F (36.4 C) (Oral)   Resp 12   Ht 6\' 3"  (1.905 m)   Wt 98 kg   SpO2 99%   BMI 27.00 kg/m   Physical Exam Vitals and nursing note reviewed.  Constitutional:      General: He is not in acute distress.    Appearance: He is well-developed. He is obese. He is not ill-appearing, toxic-appearing or diaphoretic.  HENT:     Head: Normocephalic and atraumatic.  Cardiovascular:     Rate and Rhythm: Normal rate and regular rhythm.     Heart sounds: Normal heart sounds. No murmur heard.   Pulmonary:     Effort: Pulmonary effort is normal.     Breath sounds: Normal breath sounds. No decreased breath sounds.  Chest:      Chest wall: No tenderness.     Comments: Midline surgical scar well healed, left upper chest recent surgical site for pacemaker, healing well Abdominal:     Palpations: Abdomen is soft.  Tenderness: There is no abdominal tenderness.  Musculoskeletal:     Right lower leg: Edema present.     Left lower leg: Edema present.     Comments: Mild pitting edema to bilateral lower extremities, R>L, states normal post non hodgkins/LN removal right groin  Skin:    General: Skin is warm and dry.     Findings: No erythema or rash.  Neurological:     Mental Status: He is alert and oriented to person, place, and time.  Psychiatric:        Mood and Affect: Mood normal.        Behavior: Behavior normal.     ED Results / Procedures / Treatments   Labs (all labs ordered are listed, but only abnormal results are displayed) Labs Reviewed  BASIC METABOLIC PANEL - Abnormal; Notable for the following components:      Result Value   Glucose, Bld 180 (*)    All other components within normal limits  CBC - Abnormal; Notable for the following components:   RBC 3.71 (*)    Hemoglobin 12.1 (*)    HCT 36.4 (*)    Platelets 108 (*)    All other components within normal limits  TROPONIN I (HIGH SENSITIVITY)  TROPONIN I (HIGH SENSITIVITY)    EKG EKG Interpretation  Date/Time:  Wednesday March 28 2020 10:17:13 EST Ventricular Rate:  63 PR Interval:    QRS Duration: 187 QT Interval:  471 QTC Calculation: 483 R Axis:   -60 Text Interpretation: VENTRICULAR PACED RHYTHM Baseline wander Abnormal ECG Confirmed by Carmin Muskrat [id number]) on 03/28/2020 10:24:57 AM   Radiology DG Chest Port 1 View  Result Date: 03/28/2020 CLINICAL DATA:  Chest pain.  Recent pacemaker. EXAM: PORTABLE CHEST 1 VIEW COMPARISON:  03/12/2020 FINDINGS: Cardiomegaly with left ventricular prominence. Previous median sternotomy and CABG. Dual lead pacemaker with leads in the region of the right atrium and right ventricle. Mild  chronic abnormal interstitial markings. No sign of edema or heart failure. No infiltrate, collapse or effusion. IMPRESSION: No active disease. Cardiomegaly. Pacemaker. Previous CABG. Mild chronic interstitial lung markings. Electronically Signed   By: Nelson Chimes M.D.   On: 03/28/2020 10:42    Procedures Procedures (including critical care time)  Medications Ordered in ED Medications - No data to display  ED Course  I have reviewed the triage vital signs and the nursing notes.  Pertinent labs & imaging results that were available during my care of the patient were reviewed by me and considered in my medical decision making (see chart for details).  Clinical Course as of Mar 28 1406  Wed Mar 28, 6292  3337 64 year old male presents with complaint of sharp chest pain occurring with exertion and resolving with rest for the past 3 days.  Patient is 2 weeks post pacemaker insertion. Patient arrives emergency room pain-free, has remained pain-free throughout his 4-hour observation. Troponin initially 7, 11 on repeat, consistent with priors.  CBC and BMP without significant findings.  Device interrogated, no concerning report. Recommend patient follow-up with his cardiologist, return to the ER should he have pain at rest or pain that does not resolve with rest. Case discussed with Dr. Vanita Panda, ER attending, agrees with plan of care.    [LM]    Clinical Course User Index [LM] Roque Lias   MDM Rules/Calculators/A&P                          Final  Clinical Impression(s) / ED Diagnoses Final diagnoses:  Chest pain, unspecified type    Rx / DC Orders ED Discharge Orders    None       Tacy Learn, PA-C 03/28/20 1407    Carmin Muskrat, MD 03/30/20 [phone]

## 2020-03-28 NOTE — Discharge Instructions (Signed)
Your cardiologist and plan for follow-up.  Return to the ER if your pain occurs at rest or does not improve with rest or for any other worsening or concerning symptoms.

## 2020-04-01 NOTE — Progress Notes (Signed)
Cardiology Clinic Note   Patient Name: Kelly [name] Date of Encounter: 04/02/2020  Primary Care Provider:  Celene Squibb, MD Primary Cardiologist:  Rozann Lesches, MD  Patient Profile    Kelly [name] 64 year old male presents the clinic today for follow-up evaluation of his essential hypertension, coronary artery disease, and mixed hyperlipidemia.  Past Medical History    Past Medical History:  Diagnosis Date  . Anxiety   . Arthritis   . Coronary atherosclerosis of native coronary artery    BMS LAD and PTCA RCA 2000,BMS RCA/PLA 2001, CABG June 2018  . Diverticulitis   . Essential hypertension   . Fatty liver   . Gastric ulcer   . GERD (gastroesophageal reflux disease)   . Hyperlipidemia   . Low-grade NHL (non-Hodgkin's lymphoma) 2007  . Lupus anticoagulant positive   . MI (myocardial infarction) ([place]) 2001  . Peripheral neuropathy   . Type 2 diabetes mellitus ([place])   . Umbilical hernia    Past Surgical History:  Procedure Laterality Date  . Axillary abscess     left incision and drainage left  . BACK SURGERY     multiple  . BIOPSY  08/15/2014   Procedure: BIOPSY;  Surgeon: Danie Binder, MD;  Location: [facility];  Service: Endoscopy;;  Gastric  . BIOPSY  01/19/2015   Procedure: BIOPSY (GASTRIC ULCER);  Surgeon: Danie Binder, MD;  Location: [facility];  Service: Endoscopy;;  . CARDIAC CATHETERIZATION    . cardiac stents     x5 stents  . COLONOSCOPY  June 2008   Dr. Dellis Filbert Medoff: Mild left colonic diverticulosis  . CORONARY ARTERY BYPASS GRAFT N/A 11/11/2016   Procedure: CORONARY ARTERY BYPASS GRAFTING (CABG) x 3, USING LEFT MAMMARY ARTERY AND RIGHT GREATER SAPHENOUS VEIN HARVESTED ENDOSCOPICALLY;  Surgeon: Ivin Poot, MD;  Location: [place];  Service: Open Heart Surgery;  Laterality: N/A;  . Dental extractions    . ESOPHAGOGASTRODUODENOSCOPY  June 2008   Dr. Dellis Filbert Medoff: Gastric ulcer, antral, biopsy with reactive changes associated glandular atrophy,  no H pylori. No features of lymphoma. Likely NSAID related  . ESOPHAGOGASTRODUODENOSCOPY  August 2008   Dr. Dellis Filbert Medoff: Complete healing of gastric ulcers.  . ESOPHAGOGASTRODUODENOSCOPY (EGD) WITH PROPOFOL N/A 08/15/2014   SLF: 1. Abnormal pain due to ulcers & gastriitis   . ESOPHAGOGASTRODUODENOSCOPY (EGD) WITH PROPOFOL N/A 01/19/2015   SLF: 1. Persistant ulcer with firm base 2. single gastric polyp found in the gastric antrum. Ulcer base   . EUS N/A 03/21/2015   Thickened gastric wall in pre-pyloric antrum, no obvious intramural mass, fatty pancreas, no obvious pancreatic mass.   . Inguinal lymph node biopsy     right inguinal lymph node biopsy  . INTRAVASCULAR PRESSURE WIRE/FFR STUDY N/A 09/26/2016   Procedure: Intravascular Pressure Wire/FFR Study;  Surgeon: Nelva Bush, MD;  Location: [facility];  Service: Cardiovascular;  Laterality: N/A;  . LEFT HEART CATH AND CORONARY ANGIOGRAPHY N/A 09/26/2016   Procedure: Left Heart Cath and Coronary Angiography;  Surgeon: Nelva Bush, MD;  Location: [facility];  Service: Cardiovascular;  Laterality: N/A;  . NECK SURGERY    . PACEMAKER IMPLANT N/A 03/12/2020   Procedure: PACEMAKER IMPLANT;  Surgeon: Vickie Epley, MD;  Location: [facility];  Service: Cardiovascular;  Laterality: N/A;  . POLYPECTOMY  01/19/2015   Procedure: POLYPECTOMY (GASTRIC);  Surgeon: Danie Binder, MD;  Location: [facility];  Service: Endoscopy;;  . SHOULDER ARTHROSCOPY Left   .  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  Presented  to the emergency department 03/28/2020 with complaints of chest pain.  He was brought to the emergency department via EMS.  He underwent pacemaker insertion 03/12/2020 for symptomatic bradycardia.  He had returned to work and was returning from a work break when he developed sharp chest discomfort with radiation down his right arm.  He indicated that his pain improved with rest, had lasted approximately 15-20 minutes, he was given 324 ASA, did not require nitroglycerin.  He denied shortness of breath, diaphoresis, nausea.  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  He indicated that each time his pain resolved with rest.  His EKG showed V paced rhythm 63 bpm.  Troponins low and flat, CBC/BMP with no significant abnormalities.  Device interrogated, with no concerning findings.  He presents the clinic today for follow-up evaluation states he continues to work over 70 hours/week.  He reports that during his episode of chest discomfort last week he was at work taking a break, he took a drink of water, then walked across the plant floor.  He noticed a sharp poking type sensation and told a coworker about his discomfort.  His coworker asked him to report to the factory nurse.  He states that as he was waiting for EMS transport his pain went away.  He has had one other brief episode of chest discomfort today as he was reaching for things in the back of his truck.  He states that the pain was again sharp along his left sternal border, nonradiating, and resolved with rest.  He has not taken any sublingual nitroglycerin.  He states that these episodes of chest discomfort are not like his chest pain that he had prior to his open heart surgery.  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  He states he is continuing to work light duty but has  returned to his normal volume of work hours.  His blood pressure slightly elevated today at 144/64.  He has chronic right lower extremity edema following a lymph node resection.  I will refill his nitroglycerin, start him on amlodipine 2.5 mg daily, have him maintain a blood pressure log and have him follow-up in 1 month.  Today in the clinic he denies chest pain, shortness of breath, increased lower extremity edema, fatigue, palpitations, melena, hematuria, hemoptysis, diaphoresis, weakness, presyncope, syncope, orthopnea, and PND.   Home Medications    Prior to Admission medications   Medication Sig Start Date End Date Taking? Authorizing Provider  acetaminophen (TYLENOL) 500 MG tablet Take 500-1,000 mg by mouth every 6 (six) hours as needed (for pain.).    [provider]  Ascorbic Acid (VITAMIN C) 500 MG CAPS Take 500 mg by mouth daily.    [provider]  aspirin EC 81 MG tablet Take 1 tablet (81 mg total) by mouth daily. 03/16/20  Vickie Epley, MD  B Complex-C (B-COMPLEX WITH VITAMIN C) tablet Take 1 tablet by mouth daily.    [provider]  celecoxib (CELEBREX) 200 MG capsule Take 200 mg by mouth daily. 01/09/20   [provider]  Cholecalciferol (VITAMIN D3) 50 MCG (2000 UT) TABS Take 2,000 Units by mouth daily.    [provider]  Continuous Blood Gluc Sensor (FREESTYLE LIBRE SENSOR SYSTEM) MISC Use one sensor every 10 days. 03/06/17   Cassandria Anger, MD  ferrous sulfate 325 (65 FE) MG tablet Take 325 mg by mouth daily.    [provider]  FLUoxetine (PROZAC) 20 MG capsule Take 20 mg by mouth daily.    [provider]  gabapentin (NEURONTIN) 600 MG tablet Take 600 mg by mouth at bedtime.    [provider]  glucose blood (ONETOUCH VERIO) test strip USE AS DIRECTED FOUR- FIVE TIMES DAILY 09/18/17   Nida, Marella Chimes, MD  Insulin Glargine (BASAGLAR KWIKPEN) 100 UNIT/ML SOPN INJECT 80 UNITS INTO SKIN  DAILY AT 10 PM Patient taking differently: Inject 10 Units into the skin at bedtime as needed (Depending on sugar level).  10/20/17   Cassandria Anger, MD  Multiple Vitamin (MULTIVITAMIN WITH MINERALS) TABS tablet Take 1 tablet by mouth in the morning and at bedtime.    [provider]  nitroGLYCERIN (NITROSTAT) 0.4 MG SL tablet Place 1 tablet (0.4 mg total) under the tongue every 5 (five) minutes as needed. Patient taking differently: Place 0.4 mg under the tongue every 5 (five) minutes x 3 doses as needed for chest pain.  06/01/15   Satira Sark, MD  ONETOUCH VERIO test strip USE TO TEST FOUR TIMES DAILY AS DIRECTED 12/22/17   Cassandria Anger, MD  oxyCODONE (OXY IR/ROXICODONE) 5 MG immediate release tablet Take 5-10 mg by mouth 2 (two) times daily as needed (pain.).  02/21/20   [provider]  pantoprazole (PROTONIX) 40 MG tablet TAKE 1 TABLET BY MOUTH DAILY BEFORE BREAKFAST Patient taking differently: Take 40 mg by mouth daily before breakfast.  09/15/19   Erenest Rasher, PA-C  Pediatric Multivitamins-Iron [facility] COMPLETE PO) Take 1 tablet by mouth daily.    [provider]  simvastatin (ZOCOR) 10 MG tablet TAKE 1 TABLET BY MOUTH EVERY NIGHT AT BEDTIME Patient taking differently: Take 10 mg by mouth at bedtime.  04/29/12   Satira Sark, MD    Family History    Family History  Problem Relation Age of Onset  . Pancreatic cancer Maternal Grandmother   . Obesity Mother   . Hypertension Mother   . Diabetes Father   . Thyroid disease Father   . Heart disease Father   . Colon cancer Neg Hx    He indicated that his mother is deceased. He indicated that his father is deceased. He indicated that his maternal grandmother is deceased. He indicated that the status of his neg hx is unknown.  Social History    Social History   Socioeconomic History  . Marital status: Married    Spouse name: Not on file  . Number of children: Not on file  .  Years of education: Not on file  . Highest education level: Not on file  Occupational History  . Not on file  Tobacco Use  . Smoking status: Former Smoker    Packs/day: 1.00    Years: 23.00    Pack years: 23.00    Types: Cigarettes    Start date:  10/30/1970    Quit date: 10/29/1993    Years since quitting: 26.4  . Smokeless tobacco: Former Systems developer    Types: Snuff, Chew  Vaping Use  . Vaping Use: Never used  Substance and Sexual Activity  . Alcohol use: No    Alcohol/week: 0.0 standard drinks  . Drug use: No  . Sexual activity: Never    Birth control/protection: None  Other Topics Concern  . Not on file  Social History Narrative  . Not on file   Social Determinants of Health   Financial Resource Strain:   . Difficulty of Paying Living Expenses: Not on file  Food Insecurity:   . Worried About Charity fundraiser in the Last Year: Not on file  . Ran Out of Food in the Last Year: Not on file  Transportation Needs:   . Lack of Transportation (Medical): Not on file  . Lack of Transportation (Non-Medical): Not on file  Physical Activity:   . Days of Exercise per Week: Not on file  . Minutes of Exercise per Session: Not on file  Stress:   . Feeling of Stress : Not on file  Social Connections:   . Frequency of Communication with Friends and Family: Not on file  . Frequency of Social Gatherings with Friends and Family: Not on file  . Attends Religious Services: Not on file  . Active Member of Clubs or Organizations: Not on file  . Attends Archivist Meetings: Not on file  . Marital Status: Not on file  Intimate Partner Violence:   . Fear of Current or Ex-Partner: Not on file  . Emotionally Abused: Not on file  . Physically Abused: Not on file  . Sexually Abused: Not on file     Review of Systems    General:  No chills, fever, night sweats or weight changes.  Cardiovascular:  No chest pain, dyspnea on exertion, edema, orthopnea, palpitations, paroxysmal nocturnal  dyspnea. Dermatological: No rash, lesions/masses Respiratory: No cough, dyspnea Urologic: No hematuria, dysuria Abdominal:   No nausea, vomiting, diarrhea, bright red blood per rectum, melena, or hematemesis Neurologic:  No visual changes, wkns, changes in mental status. All other systems reviewed and are otherwise negative except as noted above.  Physical Exam    VS:  BP (!) 144/64   Pulse 64   Ht 6\' 3"  (1.905 m)   Wt 236 lb (107 kg)   SpO2 98%   BMI 29.50 kg/m  , BMI Body mass index is 29.5 kg/m. GEN: Well nourished, well developed, in no acute distress. HEENT: normal. Neck: Supple, no JVD, carotid bruits, or masses. Cardiac: RRR, no murmurs, rubs, or gallops. No clubbing, cyanosis, chronic right lower extremity +1 pitting edema.  Radials/DP/PT 2+ and equal bilaterally.  Respiratory:  Respirations regular and unlabored, clear to auscultation bilaterally. GI: Soft, nontender, nondistended, BS + x 4. MS: no deformity or atrophy. Skin: warm and dry, no rash. Neuro:  Strength and sensation are intact. Psych: Normal affect.  Accessory Clinical Findings    Recent Labs: 02/10/2020: ALT 28; TSH 1.659 03/28/2020: BUN 18; Creatinine, Ser 0.92; Hemoglobin 12.1; Platelets 108; Potassium 4.2; Sodium 137   Recent Lipid Panel    Component Value Date/Time   CHOL 107 03/02/2017 1615   TRIG 164 (H) 03/02/2017 1615   HDL 39 (L) 03/02/2017 1615   CHOLHDL 2.7 03/02/2017 1615   VLDL 56 (H) 01/28/2016 1602   LDLCALC 44 03/02/2017 1615    ECG personally reviewed by me today-  none today.   Cardiac catheterization 09/26/2016 Conclusions: 1. Significant three-vessel coronary artery disease including mild to moderate in-stent restenosis involving the proximal and mid LAD as well as 70% proximal LAD stenosis just beyond the first stent. FFR across these lesions is hemodynamically significant at 0.73. 60% proximal OM2 stenosis is also present, as well as chronic total occlusion of the proximal  and mid RCA. 2. Normal left ventricular filling pressure. 3. Normal left ventricular contraction.  Recommendations: 1. Given three-vessel coronary artery disease, predominantly affecting the LAD and RCA, recommend surgical consultation for CABG. 2. Continue aggressive prevention. 3. Obtain transthoracic echocardiogram.  Nelva Bush, MD [facility]    Echocardiogram 02/21/2020 IMPRESSIONS    1. Left ventricular ejection fraction, by estimation, is 50 to 55%. The  left ventricle has low normal function. The left ventricle has no regional  wall motion abnormalities. Left ventricular diastolic parameters are  indeterminate.  2. Right ventricular systolic function is normal. The right ventricular  size is normal.  3. Left atrial size was moderately dilated.  4. Right atrial size was mild to moderately dilated.  5. The mitral valve is normal in structure. Mild mitral valve  regurgitation. No evidence of mitral stenosis.  6. The aortic valve is tricuspid. There is moderate calcification of the  aortic valve. There is moderate thickening of the aortic valve. Aortic  valve regurgitation is not visualized. Mild to moderate aortic valve  stenosis. Aortic valve mean gradient  measures 15.6 mmHg. Aortic valve peak gradient measures 28.8 mmHg. Aortic  valve area, by VTI measures 1.14 cm.  7. The inferior vena cava is normal in size with greater than 50%  respiratory variability, suggesting right atrial pressure of 3 mmHg.  Assessment & Plan   1.  Atypical chest pain/chest wall pain -no chest pain today.  Presented to the emergency department 03/28/2020 with complaints of chest discomfort and right arm discomfort.  He had returned to work after PPM insertion and noticed chest pain after a break at work.  He indicated that his pain was relieved with rest.  Troponins low and flat, EKG showed V paced rhythm.  Seems that this was related to MSK/precordial chest pain.  Continue  aspirin, nitroglycerin, simvastatin. Start amlodipine 2.5 mg daily Heart healthy low-sodium diet-salty 6 given Increase physical activity as tolerated  Essential hypertension-BP today 144/64.  Reports not monitoring at home.  Does have a blood pressure cuff. Start amlodipine 2.5 mg Heart healthy low-sodium diet as salty 6 given Increase physical activity as tolerated  Coronary artery disease-no chest pain today.  Underwent CABG x3 11/11/2016. Continue aspirin, simvastatin, nitroglycerin Heart healthy low-sodium diet-salty 6 given Increase physical activity as tolerated  Hyperlipidemia-LDL 44 on 03/02/2017. Continue simvastatin Heart healthy low-sodium high-fiber diet Increase physical activity as tolerated Follows with PCP-request labs from PCP  Disposition: Follow-up with Dr. Domenic Polite in 1 months.  Jossie Ng. Demoni Gergen NP-C    04/02/2020, 3:32 PM [place] [address] Office [phone] Fax [phone]  Notice: This dictation was prepared with Dragon dictation along with smaller phrase technology. Any transcriptional errors that result from this process are unintentional and may not be corrected upon review.

## 2020-04-02 ENCOUNTER — Encounter: Payer: Self-pay | Admitting: General Practice

## 2020-04-02 ENCOUNTER — Other Ambulatory Visit: Payer: Self-pay

## 2020-04-02 ENCOUNTER — Ambulatory Visit (INDEPENDENT_AMBULATORY_CARE_PROVIDER_SITE_OTHER): Payer: BC Managed Care – PPO | Admitting: General Practice

## 2020-04-02 VITALS — BP 144/64 | HR 64 | Ht 75.0 in | Wt 236.0 lb

## 2020-04-02 DIAGNOSIS — R0789 Other chest pain: Secondary | ICD-10-CM | POA: Diagnosis not present

## 2020-04-02 DIAGNOSIS — E782 Mixed hyperlipidemia: Secondary | ICD-10-CM

## 2020-04-02 DIAGNOSIS — I2510 Atherosclerotic heart disease of native coronary artery without angina pectoris: Secondary | ICD-10-CM

## 2020-04-02 MED ORDER — NITROGLYCERIN 0.4 MG SL SUBL: 0.4000 mg | SUBLINGUAL_TABLET | SUBLINGUAL | 3 refills | Status: DC | PRN

## 2020-04-02 MED ORDER — AMLODIPINE BESYLATE 2.5 MG PO TABS: 2.5000 mg | ORAL_TABLET | Freq: Every day | ORAL | 3 refills | Status: DC

## 2020-04-02 NOTE — Patient Instructions (Addendum)
Medication Instructions:  START Amlodipine 2.5 mg daily   *If you need a refill on your cardiac medications before your next appointment, please call your pharmacy*   Lab Work: None today If you have labs (blood work) drawn today and your tests are completely normal, you will receive your results only by: Marland Kitchen MyChart Message (if you have MyChart) OR . A paper copy in the mail If you have any lab test that is abnormal or we need to change your treatment, we will call you to review the results.   Testing/Procedures: None today   Follow-Up: At [facility], you and your health needs are our priority.  As part of our continuing mission to provide you with exceptional heart care, we have created designated Provider Care Teams.  These Care Teams include your primary Cardiologist (physician) and Advanced Practice Providers (APPs -  Physician Assistants and Nurse Practitioners) who all work together to provide you with the care you need, when you need it.  We recommend signing up for the patient portal called "MyChart".  Sign up information is provided on this After Visit Summary.  MyChart is used to connect with patients for Virtual Visits (Telemedicine).  Patients are able to view lab/test results, encounter notes, upcoming appointments, etc.  Non-urgent messages can be sent to your provider as well.   To learn more about what you can do with MyChart, go to [email or web address].    Your next appointment:   1 month(s)  The format for your next appointment:   In Person  Provider:   Coletta Memos, NP  Other Instructions  Check blood pressure 2 times a week and keep a log,provided for you.   Follow [place] diet guidelines for sodium

## 2020-05-02 ENCOUNTER — Encounter: Payer: Self-pay | Admitting: Physician Assistant

## 2020-05-02 NOTE — Progress Notes (Addendum)
Cardiology Office Note    Date:  05/04/2020   ID:  Kelly [name], DOB 01-Apr-1956, MRN [id number]  PCP:  Celene Squibb, MD  Cardiologist:  Rozann Lesches, MD  Electrophysiologist:  Vickie Epley, MD   Chief Complaint: f/u chest pain  History of Present Illness:   Kelly [name] is a 64 y.o. 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 He was recently seen in the ED 03/2020 for chest pain with reassuring workup and negative troponins. Pacer interrogation was reported to be normal. At follow-up, his episodes were felt to be atypical, potentially MSK in etiology. He was started on low dose amlodipine for BP control. Last echo 02/2020 showed EF 50-55% with normal RV, mild MR, mild-moderate AS.  He is seen back for follow-up today and is feeling under the weather. Earlier this week he developed headaches, nasal congestion, coughing and subjective fever. He got Covid tested through his job on Wednesday which he reports was negative. He is feeling mild improvement the last few days but still not back to baseline. He does report he's had 2 episodes of chest discomfort since his ED visit. One of them happened a few weeks ago while very stressed at work, lasted a few minutes, resolved while relaxing. The other happened while lifting a heavy cabinet out of a car on Monday 12/13. It lasted 20 minutes and resolved after resting. No associated sx. He has since been very active ("I stay on the go, almost too active") without any interim chest pain - he cannot reliably reproduce the chest pain  with any specific activity.  Labwork independently reviewed: 03/2020 Hgb 12.1, plt 108, K 4.2, Cr 0.92 01/2020 TSH wnl 2018 LDL 44, trig 164  Past Medical History:  Diagnosis Date  . Anxiety   . Arthritis   . Cirrhosis ([place])   . Coronary atherosclerosis of native coronary artery    BMS LAD and PTCA RCA 2000,BMS RCA/PLA 2001, CABG June 2018  . Diverticulitis   . Essential hypertension   . Fatty liver   . Gastric ulcer   . GERD (gastroesophageal reflux disease)   . Hyperlipidemia   . Low-grade NHL (non-Hodgkin's lymphoma) 2007  . Lupus anticoagulant positive   . MI (myocardial infarction) ([place]) 2001  . Pacemaker   . Peripheral neuropathy   . Thrombocytopenia ([place])   . Trifascicular block   . Type 2 diabetes mellitus ([place])   . Umbilical hernia     Past Surgical History:  Procedure Laterality Date  . Axillary abscess     left incision and drainage left  . BACK SURGERY     multiple  . BIOPSY  08/15/2014   Procedure: BIOPSY;  Surgeon: Danie Binder, MD;  Location: [facility];  Service: Endoscopy;;  Gastric  . BIOPSY  01/19/2015   Procedure: BIOPSY (GASTRIC ULCER);  Surgeon: Danie Binder, MD;  Location: [facility];  Service: Endoscopy;;  . CARDIAC CATHETERIZATION    . cardiac stents     x5 stents  . COLONOSCOPY  June 2008   Dr. Dellis Filbert Medoff: Mild left colonic diverticulosis  . CORONARY ARTERY BYPASS GRAFT N/A 11/11/2016  Procedure: CORONARY ARTERY BYPASS GRAFTING (CABG) x 3, USING LEFT MAMMARY ARTERY AND RIGHT GREATER SAPHENOUS VEIN HARVESTED ENDOSCOPICALLY;  Surgeon: Ivin Poot, MD;  Location: [facility];  Service: Open Heart Surgery;  Laterality: N/A;  . Dental extractions    . ESOPHAGOGASTRODUODENOSCOPY  June 2008   Dr. Dellis Filbert Medoff: Gastric ulcer, antral, biopsy with reactive changes associated glandular atrophy, no H pylori. No features of lymphoma. Likely NSAID related  . ESOPHAGOGASTRODUODENOSCOPY  August 2008   Dr. Dellis Filbert Medoff: Complete healing of gastric ulcers.   . ESOPHAGOGASTRODUODENOSCOPY (EGD) WITH PROPOFOL N/A 08/15/2014   SLF: 1. Abnormal pain due to ulcers & gastriitis   . ESOPHAGOGASTRODUODENOSCOPY (EGD) WITH PROPOFOL N/A 01/19/2015   SLF: 1. Persistant ulcer with firm base 2. single gastric polyp found in the gastric antrum. Ulcer base   . EUS N/A 03/21/2015   Thickened gastric wall in pre-pyloric antrum, no obvious intramural mass, fatty pancreas, no obvious pancreatic mass.   . Inguinal lymph node biopsy     right inguinal lymph node biopsy  . INTRAVASCULAR PRESSURE WIRE/FFR STUDY N/A 09/26/2016   Procedure: Intravascular Pressure Wire/FFR Study;  Surgeon: Nelva Bush, MD;  Location: [facility];  Service: Cardiovascular;  Laterality: N/A;  . LEFT HEART CATH AND CORONARY ANGIOGRAPHY N/A 09/26/2016   Procedure: Left Heart Cath and Coronary Angiography;  Surgeon: Nelva Bush, MD;  Location: [facility];  Service: Cardiovascular;  Laterality: N/A;  . NECK SURGERY    . PACEMAKER IMPLANT N/A 03/12/2020   Procedure: PACEMAKER IMPLANT;  Surgeon: Vickie Epley, MD;  Location: [facility];  Service: Cardiovascular;  Laterality: N/A;  . POLYPECTOMY  01/19/2015   Procedure: POLYPECTOMY (GASTRIC);  Surgeon: Danie Binder, MD;  Location: [facility];  Service: Endoscopy;;  . SHOULDER ARTHROSCOPY Left   . TEE WITHOUT CARDIOVERSION N/A 11/11/2016   Procedure: TRANSESOPHAGEAL ECHOCARDIOGRAM (TEE);  Surgeon: Prescott Gum, Collier Salina, MD;  Location: [facility];  Service: Open Heart Surgery;  Laterality: N/A;    Current Medications: Current Meds  Medication Sig  . acetaminophen (TYLENOL) 500 MG tablet Take 500-1,000 mg by mouth every 6 (six) hours as needed (for pain.).  Marland Kitchen amLODipine (NORVASC) 2.5 MG tablet Take 1 tablet (2.5 mg total) by mouth daily.  . Ascorbic Acid (VITAMIN C) 500 MG CAPS Take 500 mg by mouth daily.  Marland Kitchen aspirin EC 81 MG tablet Take 1 tablet (81 mg total) by mouth daily.  . B Complex-C (B-COMPLEX WITH VITAMIN C) tablet Take  1 tablet by mouth daily.  . celecoxib (CELEBREX) 200 MG capsule Take 200 mg by mouth daily.  . Cholecalciferol (VITAMIN D3) 50 MCG (2000 UT) TABS Take 2,000 Units by mouth daily.  . Continuous Blood Gluc Sensor (FREESTYLE LIBRE SENSOR SYSTEM) MISC Use one sensor every 10 days.  . ferrous sulfate 325 (65 FE) MG tablet Take 325 mg by mouth daily.  Marland Kitchen FLUoxetine (PROZAC) 20 MG capsule Take 20 mg by mouth daily.  Marland Kitchen gabapentin (NEURONTIN) 600 MG tablet Take 600 mg by mouth at bedtime.  Marland Kitchen glucose blood (ONETOUCH VERIO) test strip USE AS DIRECTED FOUR- FIVE TIMES DAILY  . Insulin Glargine (BASAGLAR KWIKPEN) 100 UNIT/ML SOPN INJECT 80 UNITS INTO SKIN DAILY AT 10 PM (Patient taking differently: Inject 10 Units into the skin at bedtime as needed (Depending on sugar level).)  . Multiple Vitamin (MULTIVITAMIN WITH MINERALS) TABS tablet Take 1 tablet by mouth in the morning and at bedtime.  . nitroGLYCERIN (NITROSTAT) 0.4 MG SL  tablet Place 1 tablet (0.4 mg total) under the tongue every 5 (five) minutes x 3 doses as needed for chest pain.  Glory Rosebush VERIO test strip USE TO TEST FOUR TIMES DAILY AS DIRECTED  . oxyCODONE (OXY IR/ROXICODONE) 5 MG immediate release tablet Take 5-10 mg by mouth 2 (two) times daily as needed (pain.).   Marland Kitchen pantoprazole (PROTONIX) 40 MG tablet TAKE 1 TABLET BY MOUTH DAILY BEFORE BREAKFAST (Patient taking differently: Take 40 mg by mouth daily before breakfast.)  . Pediatric Multivitamins-Iron (FLINTSTONES COMPLETE PO) Take 1 tablet by mouth daily.  . simvastatin (ZOCOR) 10 MG tablet TAKE 1 TABLET BY MOUTH EVERY NIGHT AT BEDTIME (Patient taking differently: Take 10 mg by mouth at bedtime.)     Allergies:   Patient has no known allergies.   Social History   Socioeconomic History  . Marital status: Married    Spouse name: Not on file  . Number of children: Not on file  . Years of education: Not on file  . Highest education level: Not on file  Occupational History  . Not on file   Tobacco Use  . Smoking status: Former Smoker    Packs/day: 1.00    Years: 23.00    Pack years: 23.00    Types: Cigarettes    Start date: 10/30/1970    Quit date: 10/29/1993    Years since quitting: 26.5  . Smokeless tobacco: Former Systems developer    Types: Snuff, Chew  Vaping Use  . Vaping Use: Never used  Substance and Sexual Activity  . Alcohol use: No    Alcohol/week: 0.0 standard drinks  . Drug use: No  . Sexual activity: Never    Birth control/protection: None  Other Topics Concern  . Not on file  Social History Narrative  . Not on file   Social Determinants of Health   Financial Resource Strain: Not on file  Food Insecurity: Not on file  Transportation Needs: Not on file  Physical Activity: Not on file  Stress: Not on file  Social Connections: Not on file     Family History:  The patient's family history includes Diabetes in his father; Heart disease in his father; Hypertension in his mother; Obesity in his mother; Pancreatic cancer in his maternal grandmother; Thyroid disease in his father. There is no history of Colon cancer.  ROS:   Please see the history of present illness.  All other systems are reviewed and otherwise negative.    EKGs/Labs/Other Studies Reviewed:    Studies reviewed are outlined and summarized above. Reports included below if pertinent.  2D Echo 02/21/20 1. Left ventricular ejection fraction, by estimation, is 50 to 55%. The  left ventricle has low normal function. The left ventricle has no regional  wall motion abnormalities. Left ventricular diastolic parameters are  indeterminate.  2. Right ventricular systolic function is normal. The right ventricular  size is normal.  3. Left atrial size was moderately dilated.  4. Right atrial size was mild to moderately dilated.  5. The mitral valve is normal in structure. Mild mitral valve  regurgitation. No evidence of mitral stenosis.  6. The aortic valve is tricuspid. There is moderate  calcification of the  aortic valve. There is moderate thickening of the aortic valve. Aortic  valve regurgitation is not visualized. Mild to moderate aortic valve  stenosis. Aortic valve mean gradient  measures 15.6 mmHg. Aortic valve peak gradient measures 28.8 mmHg. Aortic  valve area, by VTI measures 1.14 cm.  7. The  inferior vena cava is normal in size with greater than 50%  respiratory variability, suggesting right atrial pressure of 3 mmHg.   LHC 09/2016 Conclusions: 1. Significant three-vessel coronary artery disease including mild to moderate in-stent restenosis involving the proximal and mid LAD as well as 70% proximal LAD stenosis just beyond the first stent. FFR across these lesions is hemodynamically significant at 0.73. 60% proximal OM2 stenosis is also present, as well as chronic total occlusion of the proximal and mid RCA. 2. Normal left ventricular filling pressure. 3. Normal left ventricular contraction.  Recommendations: 1. Given three-vessel coronary artery disease, predominantly affecting the LAD and RCA, recommend surgical consultation for CABG. 2. Continue aggressive prevention. 3. Obtain transthoracic echocardiogram.  Nelva Bush, MD [facility] Pager: [phone]     EKG:  EKG is ordered today, personally reviewed, demonstrating AV paced rhythm 63bpm  Recent Labs: 02/10/2020: ALT 28; TSH 1.659 03/28/2020: BUN 18; Creatinine, Ser 0.92; Hemoglobin 12.1; Platelets 108; Potassium 4.2; Sodium 137  Recent Lipid Panel    Component Value Date/Time   CHOL 107 03/02/2017 1615   TRIG 164 (H) 03/02/2017 1615   HDL 39 (L) 03/02/2017 1615   CHOLHDL 2.7 03/02/2017 1615   VLDL 56 (H) 01/28/2016 1602   LDLCALC 44 03/02/2017 1615    PHYSICAL EXAM:    VS:  BP 122/64   Pulse 71   Temp 99.1 F (37.3 C) (Oral)   Ht 6\' 4"  (1.93 m)   Wt 224 lb 12.8 oz (102 kg)   SpO2 96%   BMI 27.36 kg/m   BMI: Body mass index is 27.36 kg/m.  GEN: Well nourished,  well developed WM, in no acute distress HEENT: normocephalic, atraumatic Neck: no JVD, carotid bruits, or masses, nasal quality to voice Cardiac: RRR; soft SEM RUSB, no rubs or gallops, mild RLE edema  Respiratory:  clear to auscultation bilaterally, normal work of breathing GI: soft, nontender, nondistended, + BS MS: no deformity or atrophy Skin: warm and dry, no rash Neuro:  Alert and Oriented x 3, Strength and sensation are intact, follows commands Psych: euthymic mood, full affect  Wt Readings from Last 3 Encounters:  05/04/20 224 lb 12.8 oz (102 kg)  04/02/20 236 lb (107 kg)  03/28/20 216 lb (98 kg)     ASSESSMENT & PLAN:   1. Atypical chest pain with prior history of CAD s/p PCI, CABG - evaluated for this in 03/2020 with negative troponins, felt MSK in recent follow-up. He reports 2 recurrent episodes since that ED visit. He has been able to exert himself regularly without symptoms, aside from the episode that happened while lifting a heavy cabinet. He is not tachycardic, tachypneic or hypoxic and has no CP or SOB today. EKG shows AV paced rhythm and he is feeling better today albeit with URI sx. Symptoms have mixed features therefore will undertake a Lexiscan stress test for further evaluation. We'll schedule for 10 days out to allow him time to recover from URI. As below, consent obtained/discussed with patient. Lexiscan chosen due to PPM. I considered adding empiric Imdur today but given the patient's headaches associated with URI, the patient does not wish to proceed therefore will hold off. ER precautions/warning sx reviewed with patient. Remains on low dose simvastatin. Statin titration can be considered in the future when he is feeling better.  2. Aortic stenosis and mitral regurgitation - by recent echocardiogram. Follow clinically. Anticipate repeat echo due around 02/2021, can be arranged closer to that time.  3. Essential  HTN - controlled on current regimen. No changes  today.  4. Bradycardia s/p PPM 05/2020 - followed by Dr. Quentin Ore, has f/u 05/2020 with him.  5. URI - appears nontoxic. Lungs clear. VSS. Temp 99.1. Covid swab negative 2 days prior per patient report. Slight gradual improvement in sx. He inquires if he should get flu shot today - was planning to get it when he was out and about. I told him given that he continues to have cold symptoms he in general should be staying home out of public until he is feeling better, then pursue vaccination. He had his Covid booster earlier this month already. I did encourage him to reach out to his PCP to discuss his symptoms as well.  Disposition: F/u with APP after stress testing, either in person or virtually is OK.  Shared Decision Making/Informed Consent The risks [chest pain, shortness of breath, cardiac arrhythmias, dizziness, blood pressure fluctuations, myocardial infarction, stroke/transient ischemic attack, nausea, vomiting, allergic reaction, radiation exposure, metallic taste sensation and life-threatening complications (estimated to be 1 in 10,000)], benefits (risk stratification, diagnosing coronary artery disease, treatment guidance) and alternatives of a nuclear stress test were discussed in detail with Mr. Belnap and he agrees to proceed.   Medication Adjustments/Labs and Tests Ordered: Current medicines are reviewed at length with the patient today.  Concerns regarding medicines are outlined above. Medication changes, Labs and Tests ordered today are summarized above and listed in the Patient Instructions accessible in Encounters.    Signed, Charlie Pitter, PA-C  05/04/2020 2:16 PM    [facility] in [place]. [place], [place] [address] Ph: [phone]; Fax [phone]

## 2020-05-04 ENCOUNTER — Encounter: Payer: Self-pay | Admitting: Physician Assistant

## 2020-05-04 ENCOUNTER — Other Ambulatory Visit: Payer: Self-pay

## 2020-05-04 ENCOUNTER — Encounter: Payer: Self-pay | Admitting: *Deleted

## 2020-05-04 ENCOUNTER — Ambulatory Visit: Payer: BC Managed Care – PPO | Admitting: Physician Assistant

## 2020-05-04 VITALS — BP 122/64 | HR 71 | Temp 99.1°F | Ht 76.0 in | Wt 224.8 lb

## 2020-05-04 DIAGNOSIS — I10 Essential (primary) hypertension: Secondary | ICD-10-CM

## 2020-05-04 DIAGNOSIS — Z950 Presence of cardiac pacemaker: Secondary | ICD-10-CM | POA: Diagnosis not present

## 2020-05-04 DIAGNOSIS — R0789 Other chest pain: Secondary | ICD-10-CM | POA: Diagnosis not present

## 2020-05-04 DIAGNOSIS — I2510 Atherosclerotic heart disease of native coronary artery without angina pectoris: Secondary | ICD-10-CM | POA: Diagnosis not present

## 2020-05-04 DIAGNOSIS — I350 Nonrheumatic aortic (valve) stenosis: Secondary | ICD-10-CM

## 2020-05-04 DIAGNOSIS — J069 Acute upper respiratory infection, unspecified: Secondary | ICD-10-CM

## 2020-05-04 NOTE — Patient Instructions (Signed)
Medication Instructions:  Your physician recommends that you continue on your current medications as directed. Please refer to the Current Medication list given to you today.   *If you need a refill on your cardiac medications before your next appointment, please call your pharmacy*   Lab Work: NONE   If you have labs (blood work) drawn today and your tests are completely normal, you will receive your results only by: Marland Kitchen MyChart Message (if you have MyChart) OR . A paper copy in the mail If you have any lab test that is abnormal or we need to change your treatment, we will call you to review the results.   Testing/Procedures: Your physician has requested that you have a lexiscan myoview. For further information please visit [email or web address]. Please follow instruction sheet, as given.     Follow-Up: At [facility], you and your health needs are our priority.  As part of our continuing mission to provide you with exceptional heart care, we have created designated Provider Care Teams.  These Care Teams include your primary Cardiologist (physician) and Advanced Practice Providers (APPs -  Physician Assistants and Nurse Practitioners) who all work together to provide you with the care you need, when you need it.  We recommend signing up for the patient portal called "MyChart".  Sign up information is provided on this After Visit Summary.  MyChart is used to connect with patients for Virtual Visits (Telemedicine).  Patients are able to view lab/test results, encounter notes, upcoming appointments, etc.  Non-urgent messages can be sent to your provider as well.   To learn more about what you can do with MyChart, go to [email or web address].    Your next appointment:    After your stress test   The format for your next appointment:   In Person  Provider:   You will see one of the following Advanced Practice Providers on your designated Care Team:    Bernerd Pho, PA-C    Ermalinda Barrios, PA-C   Other Instructions Thank you for choosing [facility]!

## 2020-05-16 ENCOUNTER — Other Ambulatory Visit: Payer: Self-pay

## 2020-05-16 ENCOUNTER — Ambulatory Visit (HOSPITAL_COMMUNITY)
Admission: RE | Admit: 2020-05-16 | Discharge: 2020-05-16 | Disposition: A | Discharge status: Home / Self Care | Payer: BC Managed Care – PPO | Source: Ambulatory Visit | Attending: Physician Assistant | Admitting: Physician Assistant

## 2020-05-16 ENCOUNTER — Encounter (HOSPITAL_COMMUNITY)
Admission: RE | Admit: 2020-05-16 | Discharge: 2020-05-16 | Disposition: A | Discharge status: Home / Self Care | Payer: BC Managed Care – PPO | Source: Ambulatory Visit | Attending: Physician Assistant | Admitting: Physician Assistant

## 2020-05-16 DIAGNOSIS — R0789 Other chest pain: Secondary | ICD-10-CM | POA: Insufficient documentation

## 2020-05-16 LAB — NM MYOCAR MULTI W/SPECT W/WALL MOTION / EF
LV dias vol: 222 mL (ref 62–150)
LV sys vol: 129 mL
Peak HR: 66 {beats}/min
RATE: 0.36
Rest HR: 60 {beats}/min
SDS: 0
SRS: 3
SSS: 3
TID: 1.24

## 2020-05-16 MED ORDER — REGADENOSON 0.4 MG/5ML IV SOLN
INTRAVENOUS | Status: AC
  Administered 2020-05-16: 09:00:00 0.4 mg via INTRAVENOUS
  Filled 2020-05-16: qty 5

## 2020-05-16 MED ORDER — SODIUM CHLORIDE FLUSH 0.9 % IV SOLN
INTRAVENOUS | Status: AC
  Administered 2020-05-16: 09:00:00 10 mL via INTRAVENOUS
  Filled 2020-05-16: qty 10

## 2020-05-16 MED ORDER — TECHNETIUM TC 99M TETROFOSMIN IV KIT
10.0000 | PACK | Freq: Once | INTRAVENOUS | Status: AC | PRN
  Administered 2020-05-16: 08:00:00 11 via INTRAVENOUS

## 2020-05-16 MED ORDER — TECHNETIUM TC 99M TETROFOSMIN IV KIT
30.0000 | PACK | Freq: Once | INTRAVENOUS | Status: AC | PRN
  Administered 2020-05-16: 09:00:00 29 via INTRAVENOUS

## 2020-05-17 ENCOUNTER — Telehealth: Payer: Self-pay | Admitting: *Deleted

## 2020-05-17 DIAGNOSIS — I200 Unstable angina: Secondary | ICD-10-CM

## 2020-05-17 NOTE — Telephone Encounter (Signed)
-----   Message from Laurann Montana, [place] sent at 05/17/2020  7:44 AM EST ----- Please let pt know nuclear stress test shows scar but no new ischemia/blockage concerns, no significant change from prior study. EF was somewhat lower than prior studies - sometimes this can be falsely low on nuclear stress test but will be important to know if any new change - can we get a LIMITED 2d echo to look at EF? Had one in October, but chest pain was new since then. If EF comes back stable, then do not anticipate any further cardiac workup of his chest pain.

## 2020-05-17 NOTE — Telephone Encounter (Signed)
Pt and wife notified order placed.

## 2020-06-12 ENCOUNTER — Ambulatory Visit (INDEPENDENT_AMBULATORY_CARE_PROVIDER_SITE_OTHER): Payer: BC Managed Care – PPO

## 2020-06-12 DIAGNOSIS — I453 Trifascicular block: Secondary | ICD-10-CM

## 2020-06-13 LAB — CUP PACEART REMOTE DEVICE CHECK
Battery Remaining Longevity: 52 mo
Battery Remaining Percentage: 95.5 %
Battery Voltage: 2.99 V
Brady Statistic AP VP Percent: 33 %
Brady Statistic AP VS Percent: 1 %
Brady Statistic AS VP Percent: 66 %
Brady Statistic AS VS Percent: 1 %
Brady Statistic RA Percent Paced: 32 %
Brady Statistic RV Percent Paced: 99 %
Date Time Interrogation Session: 20220125215050
Implantable Lead Implant Date: 20211025
Implantable Lead Implant Date: 20211025
Implantable Lead Location: 753859
Implantable Lead Location: 753860
Implantable Pulse Generator Implant Date: 20211025
Lead Channel Impedance Value: 440 Ohm
Lead Channel Impedance Value: 490 Ohm
Lead Channel Pacing Threshold Amplitude: 0.5 V
Lead Channel Pacing Threshold Amplitude: 1.5 V
Lead Channel Pacing Threshold Pulse Width: 0.5 ms
Lead Channel Pacing Threshold Pulse Width: 1 ms
Lead Channel Sensing Intrinsic Amplitude: 2.1 mV
Lead Channel Sensing Intrinsic Amplitude: 7.8 mV
Lead Channel Setting Pacing Amplitude: 3.5 V
Lead Channel Setting Pacing Amplitude: 3.5 V
Lead Channel Setting Pacing Pulse Width: 0.5 ms
Lead Channel Setting Sensing Sensitivity: 2.5 mV
Pulse Gen Model: 2272
Pulse Gen Serial Number: 3873891

## 2020-06-18 ENCOUNTER — Encounter: Payer: Self-pay | Admitting: Cardiology

## 2020-06-18 ENCOUNTER — Ambulatory Visit (INDEPENDENT_AMBULATORY_CARE_PROVIDER_SITE_OTHER): Payer: BC Managed Care – PPO | Admitting: Cardiology

## 2020-06-18 ENCOUNTER — Other Ambulatory Visit: Payer: Self-pay

## 2020-06-18 VITALS — BP 154/60 | HR 67 | Ht 76.0 in | Wt 227.0 lb

## 2020-06-18 DIAGNOSIS — R001 Bradycardia, unspecified: Secondary | ICD-10-CM | POA: Diagnosis not present

## 2020-06-18 DIAGNOSIS — R079 Chest pain, unspecified: Secondary | ICD-10-CM

## 2020-06-18 DIAGNOSIS — Z01812 Encounter for preprocedural laboratory examination: Secondary | ICD-10-CM | POA: Diagnosis not present

## 2020-06-18 DIAGNOSIS — I453 Trifascicular block: Secondary | ICD-10-CM | POA: Diagnosis not present

## 2020-06-18 DIAGNOSIS — I25118 Atherosclerotic heart disease of native coronary artery with other forms of angina pectoris: Secondary | ICD-10-CM

## 2020-06-18 NOTE — Progress Notes (Signed)
Electrophysiology Office Follow up Visit Note:    Date:  06/18/2020   ID:  Kelly [name], DOB 1956/03/02, MRN [id number]  PCP:  Celene Squibb, MD  [facility] Cardiologist:  Rozann Lesches, MD  [facility] Electrophysiologist:  Vickie Epley, MD    Interval History:    Kelly [name] is a 65 y.o. male who presents for a follow up visit after permanent pacemaker implant on March 12, 2020.  Patient tells me that since I have seen him last he has had some concerning chest pain.  Chest pain occurs with heavy exertion and is centrally located.  It is extremely severe pain and stops him in his tracks.  He tells me he was moving some stuff out of his pickup was going up some steps when he had sudden onset central chest pain.  He told his wife that some was wrong and he had a sit down.  He told me that he had to sit for 30 minutes before the pain improved.  He denies diaphoresis or radiation of the pain.  Gentle breathing slowly improve the pain.  No syncope or presyncope. [name] is very active working 72 hours/week for [employer].  He tells me he gets off work and goes home to work more.  The pain has consistently occurred with heavy exertion.   Past Medical History:  Diagnosis Date  . Anxiety   . Arthritis   . Cirrhosis ([place])   . Coronary atherosclerosis of native coronary artery    BMS LAD and PTCA RCA 2000,BMS RCA/PLA 2001, CABG June 2018  . Diverticulitis   . Essential hypertension   . Fatty liver   . Gastric ulcer   . GERD (gastroesophageal reflux disease)   . Hyperlipidemia   . Low-grade NHL (non-Hodgkin's lymphoma) 2007  . Lupus anticoagulant positive   . MI (myocardial infarction) ([place]) 2001  . Pacemaker   . Peripheral neuropathy   . Thrombocytopenia ([place])   . Trifascicular block   . Type 2 diabetes mellitus ([place])   . Umbilical hernia     Past Surgical History:  Procedure Laterality Date  . Axillary abscess     left incision and drainage left  . BACK  SURGERY     multiple  . BIOPSY  08/15/2014   Procedure: BIOPSY;  Surgeon: Danie Binder, MD;  Location: [facility];  Service: Endoscopy;;  Gastric  . BIOPSY  01/19/2015   Procedure: BIOPSY (GASTRIC ULCER);  Surgeon: Danie Binder, MD;  Location: [facility];  Service: Endoscopy;;  . CARDIAC CATHETERIZATION    . cardiac stents     x5 stents  . COLONOSCOPY  June 2008   Dr. Dellis Filbert Medoff: Mild left colonic diverticulosis  . CORONARY ARTERY BYPASS GRAFT N/A 11/11/2016   Procedure: CORONARY ARTERY BYPASS GRAFTING (CABG) x 3, USING LEFT MAMMARY ARTERY AND RIGHT GREATER SAPHENOUS VEIN HARVESTED ENDOSCOPICALLY;  Surgeon: Ivin Poot, MD;  Location: [place];  Service: Open Heart Surgery;  Laterality: N/A;  . Dental extractions    . ESOPHAGOGASTRODUODENOSCOPY  June 2008   Dr. Dellis Filbert Medoff: Gastric ulcer, antral, biopsy with reactive changes associated glandular atrophy, no H pylori. No features of lymphoma. Likely NSAID related  . ESOPHAGOGASTRODUODENOSCOPY  August 2008   Dr. Dellis Filbert Medoff: Complete healing of gastric ulcers.  . ESOPHAGOGASTRODUODENOSCOPY (EGD) WITH PROPOFOL N/A 08/15/2014   SLF: 1. Abnormal pain due to ulcers & gastriitis   . ESOPHAGOGASTRODUODENOSCOPY (EGD) WITH PROPOFOL N/A 01/19/2015   SLF: 1. Persistant  ulcer with firm base 2. single gastric polyp found in the gastric antrum. Ulcer base   . EUS N/A 03/21/2015   Thickened gastric wall in pre-pyloric antrum, no obvious intramural mass, fatty pancreas, no obvious pancreatic mass.   . Inguinal lymph node biopsy     right inguinal lymph node biopsy  . INTRAVASCULAR PRESSURE WIRE/FFR STUDY N/A 09/26/2016   Procedure: Intravascular Pressure Wire/FFR Study;  Surgeon: Nelva Bush, MD;  Location: [place] [facility];  Service: Cardiovascular;  Laterality: N/A;  . LEFT HEART CATH AND CORONARY ANGIOGRAPHY N/A 09/26/2016   Procedure: Left Heart Cath and Coronary Angiography;  Surgeon: Nelva Bush, MD;  Location: [place] [facility];   Service: Cardiovascular;  Laterality: N/A;  . NECK SURGERY    . PACEMAKER IMPLANT N/A 03/12/2020   Procedure: PACEMAKER IMPLANT;  Surgeon: Vickie Epley, MD;  Location: [place] [facility];  Service: Cardiovascular;  Laterality: N/A;  . POLYPECTOMY  01/19/2015   Procedure: POLYPECTOMY (GASTRIC);  Surgeon: Danie Binder, MD;  Location: [facility];  Service: Endoscopy;;  . SHOULDER ARTHROSCOPY Left   . TEE WITHOUT CARDIOVERSION N/A 11/11/2016   Procedure: TRANSESOPHAGEAL ECHOCARDIOGRAM (TEE);  Surgeon: Prescott Gum, Collier Salina, MD;  Location: [place];  Service: Open Heart Surgery;  Laterality: N/A;    Current Medications: Current Meds  Medication Sig  . acetaminophen (TYLENOL) 500 MG tablet Take 500-1,000 mg by mouth every 6 (six) hours as needed (for pain.).  Marland Kitchen amLODipine (NORVASC) 2.5 MG tablet Take 1 tablet (2.5 mg total) by mouth daily.  . Ascorbic Acid (VITAMIN C) 500 MG CAPS Take 500 mg by mouth daily.  Marland Kitchen aspirin EC 81 MG tablet Take 1 tablet (81 mg total) by mouth daily.  . B Complex-C (B-COMPLEX WITH VITAMIN C) tablet Take 1 tablet by mouth daily.  . celecoxib (CELEBREX) 200 MG capsule Take 200 mg by mouth daily.  . Cholecalciferol (VITAMIN D3) 50 MCG (2000 UT) TABS Take 2,000 Units by mouth daily.  . Continuous Blood Gluc Sensor (FREESTYLE LIBRE SENSOR SYSTEM) MISC Use one sensor every 10 days.  . ferrous sulfate 325 (65 FE) MG tablet Take 325 mg by mouth daily.  Marland Kitchen FLUoxetine (PROZAC) 20 MG capsule Take 20 mg by mouth daily.  Marland Kitchen gabapentin (NEURONTIN) 600 MG tablet Take 600 mg by mouth at bedtime.  Marland Kitchen glucose blood (ONETOUCH VERIO) test strip USE AS DIRECTED FOUR- FIVE TIMES DAILY  . Insulin Glargine (BASAGLAR KWIKPEN) 100 UNIT/ML SOPN INJECT 80 UNITS INTO SKIN DAILY AT 10 PM (Patient taking differently: Inject 10 Units into the skin at bedtime as needed (Depending on sugar level).)  . Multiple Vitamin (MULTIVITAMIN WITH MINERALS) TABS tablet Take 1 tablet by mouth in the morning and at bedtime.   . nitroGLYCERIN (NITROSTAT) 0.4 MG SL tablet Place 1 tablet (0.4 mg total) under the tongue every 5 (five) minutes x 3 doses as needed for chest pain.  Glory Rosebush VERIO test strip USE TO TEST FOUR TIMES DAILY AS DIRECTED  . oxyCODONE (OXY IR/ROXICODONE) 5 MG immediate release tablet Take 5-10 mg by mouth 2 (two) times daily as needed (pain.).   Marland Kitchen pantoprazole (PROTONIX) 40 MG tablet TAKE 1 TABLET BY MOUTH DAILY BEFORE BREAKFAST (Patient taking differently: Take 40 mg by mouth daily before breakfast.)  . Pediatric Multivitamins-Iron (FLINTSTONES COMPLETE PO) Take 1 tablet by mouth daily.  . simvastatin (ZOCOR) 10 MG tablet TAKE 1 TABLET BY MOUTH EVERY NIGHT AT BEDTIME (Patient taking differently: Take 10 mg by mouth at  bedtime.)     Allergies:   Patient has no known allergies.   Social History   Socioeconomic History  . Marital status: Married    Spouse name: Not on file  . Number of children: Not on file  . Years of education: Not on file  . Highest education level: Not on file  Occupational History  . Not on file  Tobacco Use  . Smoking status: Former Smoker    Packs/day: 1.00    Years: 23.00    Pack years: 23.00    Types: Cigarettes    Start date: 10/30/1970    Quit date: 10/29/1993    Years since quitting: 26.6  . Smokeless tobacco: Former Systems developer    Types: Snuff, Chew  Vaping Use  . Vaping Use: Never used  Substance and Sexual Activity  . Alcohol use: No    Alcohol/week: 0.0 standard drinks  . Drug use: No  . Sexual activity: Never    Birth control/protection: None  Other Topics Concern  . Not on file  Social History Narrative  . Not on file   Social Determinants of Health   Financial Resource Strain: Not on file  Food Insecurity: Not on file  Transportation Needs: Not on file  Physical Activity: Not on file  Stress: Not on file  Social Connections: Not on file     Family History: The patient's family history includes Diabetes in his father; Heart disease in  his father; Hypertension in his mother; Obesity in his mother; Pancreatic cancer in his maternal grandmother; Thyroid disease in his father. There is no history of Colon cancer.  ROS:   Please see the history of present illness.    All other systems reviewed and are negative.  EKGs/Labs/Other Studies Reviewed:    The following studies were reviewed today:  May 16, 2020 SPECT  Lexiscan stress is electrically nondiagnostic due to pacing  Myovue scan shows mild inferior defect consistent with scar and possible soft tissue attenuation No significant ischemia  LVEF 42% septal hypkinesis  Overall intermediate risk scan  No signficant change from study in 2013  June 18, 2020 device interrogation personally reviewed Presenting rhythm a sensed V paced DDDR 60-1 20 Atrial capture threshold 1.25 V at 1 ms, sensing 2.4 mV, impedance 440 ohms Ventricular capture threshold 0.75 at 0.5, sensing 8.7 mV, impedance 560 ohms Longevity estimated at approximately 8 years  EKG:  The ekg ordered today demonstrates a sensed V paced rhythm.  Recent Labs: 02/10/2020: ALT 28; TSH 1.659 03/28/2020: BUN 18; Creatinine, Ser 0.92; Hemoglobin 12.1; Platelets 108; Potassium 4.2; Sodium 137  Recent Lipid Panel    Component Value Date/Time   CHOL 107 03/02/2017 1615   TRIG 164 (H) 03/02/2017 1615   HDL 39 (L) 03/02/2017 1615   CHOLHDL 2.7 03/02/2017 1615   VLDL 56 (H) 01/28/2016 1602   LDLCALC 44 03/02/2017 1615    Physical Exam:    VS:  BP (!) 154/60   Pulse 67   Ht 6\' 4"  (1.93 m)   Wt 227 lb (103 kg)   SpO2 99%   BMI 27.63 kg/m     Wt Readings from Last 3 Encounters:  06/18/20 227 lb (103 kg)  05/04/20 224 lb 12.8 oz (102 kg)  04/02/20 236 lb (107 kg)     GEN:  Well nourished, well developed in no acute distress HEENT: Normal NECK: No JVD; No carotid bruits LYMPHATICS: No lymphadenopathy CARDIAC: RRR, no murmurs, rubs, gallops RESPIRATORY:  Clear to auscultation  without rales,  wheezing or rhonchi  ABDOMEN: Soft, non-tender, non-distended MUSCULOSKELETAL:  No edema; No deformity  SKIN: Warm and dry NEUROLOGIC:  Alert and oriented x 3 PSYCHIATRIC:  Normal affect   ASSESSMENT:    1. Trifascicular block   2. Symptomatic bradycardia   3. Coronary artery disease of native artery of native heart with stable angina pectoris ([place])    PLAN:    In order of problems listed above:  1. Trifascicular block Patient is requiring 99% ventricular pacing.  Atrially pacing 32% of the time.  Lead diagnostics are stable.  Battery longevity is good.  2.  Chest pain Concerning for ischemia given the severity the pain and its typical nature.  He describes the pain is somewhat similar to the pain he had with his first heart attacks that led ultimately to his bypass surgery.  Would like to proceed with a left heart catheterization.  On aspirin 81 mg daily.  Follow-up 3 months  Medication Adjustments/Labs and Tests Ordered: Current medicines are reviewed at length with the patient today.  Concerns regarding medicines are outlined above.  Orders Placed This Encounter  Procedures  . EKG 12-Lead   No orders of the defined types were placed in this encounter.    Signed, Lars Mage, MD, [facility]  06/18/2020 2:58 PM    Electrophysiology [place]

## 2020-06-18 NOTE — Patient Instructions (Addendum)
Medication Instructions:  Your physician recommends that you continue on your current medications as directed. Please refer to the Current Medication list given to you today.  *If you need a refill on your cardiac medications before your next appointment, please call your pharmacy*   Lab Work: CBC and BMET today If you have labs (blood work) drawn today and your tests are completely normal, you will receive your results only by: Marland Kitchen MyChart Message (if you have MyChart) OR . A paper copy in the mail If you have any lab test that is abnormal or we need to change your treatment, we will call you to review the results.   Testing/Procedures: Your physician has requested that you have a cardiac catheterization. Cardiac catheterization is used to diagnose and/or treat various heart conditions. Doctors may recommend this procedure for a number of different reasons. The most common reason is to evaluate chest pain. Chest pain can be a symptom of coronary artery disease (CAD), and cardiac catheterization can show whether plaque is narrowing or blocking your heart's arteries. This procedure is also used to evaluate the valves, as well as measure the blood flow and oxygen levels in different parts of your heart. For further information please visit [email or web address]. Please follow instruction sheet, as given.     Follow-Up: At [facility], you and your health needs are our priority.  As part of our continuing mission to provide you with exceptional heart care, we have created designated Provider Care Teams.  These Care Teams include your primary Cardiologist (physician) and Advanced Practice Providers (APPs -  Physician Assistants and Nurse Practitioners) who all work together to provide you with the care you need, when you need it.  We recommend signing up for the patient portal called "MyChart".  Sign up information is provided on this After Visit Summary.  MyChart is used to connect with patients  for Virtual Visits (Telemedicine).  Patients are able to view lab/test results, encounter notes, upcoming appointments, etc.  Non-urgent messages can be sent to your provider as well.   To learn more about what you can do with MyChart, go to [email or web address].    Your next appointment:   3 month(s)  The format for your next appointment:   In Person  Provider:   Lars Mage, MD   Other Instructions     [facility] [place] [facility] OFFICE [place], [place] [address] Dept: [phone] Loc: [place]  06/18/2020  You are scheduled for a Cardiac Catheterization on Friday, February 11 with Dr. Sherren Mocha.  1. Please arrive at the [facility] (Main Entrance A) at [facility]: [address] [place], [place] [address] at 10:30 AM (This time is two hours before your procedure to ensure your preparation). Free valet parking service is available.   Special note: Every effort is made to have your procedure done on time. Please understand that emergencies sometimes delay scheduled procedures.  2. Diet: Do not eat solid foods after midnight.  The patient may have clear liquids until 5am upon the day of the procedure.  3. Labs: CBC and BMET today  Your Covid Screening has been scheduled for Wednesday 06/27/2020 at 1:15pm at [place]. [place].  This is a drive thru testing site.  Please stay in your car and you will be directed to the appropriate testing line.   4. Medication instructions in preparation for your procedure:  Contrast Allergy: No    You will only take half of your regular dose of insulin the night before your procedure.   On the morning of your procedure, take your Aspirin and any morning medicines NOT listed above.  You may use sips of water.  5. Plan for one night stay--bring personal belongings. 6. Bring a current list of  your medications and current insurance cards. 7. You MUST have a responsible person to drive you home. 8. Someone MUST be with you the first 24 hours after you arrive home or your discharge will be delayed. 9. Please wear clothes that are easy to get on and off and wear slip-on shoes.  Thank you for allowing [place] to care for you!   -- [place] [facility]

## 2020-06-18 NOTE — H&P (View-Only) (Signed)
Electrophysiology Office Follow up Visit Note:    Date:  06/18/2020   ID:  Kelly [name], DOB 1955/11/04, MRN [id number]  PCP:  Celene Squibb, MD  [facility] Cardiologist:  Rozann Lesches, MD  [facility] Electrophysiologist:  Vickie Epley, MD    Interval History:    Kelly [name] is a 65 y.o. male who presents for a follow up visit after permanent pacemaker implant on March 12, 2020.  Patient tells me that since I have seen him last he has had some concerning chest pain.  Chest pain occurs with heavy exertion and is centrally located.  It is extremely severe pain and stops him in his tracks.  He tells me he was moving some stuff out of his pickup was going up some steps when he had sudden onset central chest pain.  He told his wife that some was wrong and he had a sit down.  He told me that he had to sit for 30 minutes before the pain improved.  He denies diaphoresis or radiation of the pain.  Gentle breathing slowly improve the pain.  No syncope or presyncope. Kelly [name] is very active working 72 hours/week for [employer].  He tells me he gets off work and goes home to work more.  The pain has consistently occurred with heavy exertion.   Past Medical History:  Diagnosis Date  . Anxiety   . Arthritis   . Cirrhosis ([place])   . Coronary atherosclerosis of native coronary artery    BMS LAD and PTCA RCA 2000,BMS RCA/PLA 2001, CABG June 2018  . Diverticulitis   . Essential hypertension   . Fatty liver   . Gastric ulcer   . GERD (gastroesophageal reflux disease)   . Hyperlipidemia   . Low-grade NHL (non-Hodgkin's lymphoma) 2007  . Lupus anticoagulant positive   . MI (myocardial infarction) ([place]) 2001  . Pacemaker   . Peripheral neuropathy   . Thrombocytopenia ([place])   . Trifascicular block   . Type 2 diabetes mellitus ([place])   . Umbilical hernia     Past Surgical History:  Procedure Laterality Date  . Axillary abscess     left incision and drainage left  . BACK  SURGERY     multiple  . BIOPSY  08/15/2014   Procedure: BIOPSY;  Surgeon: Danie Binder, MD;  Location: [facility];  Service: Endoscopy;;  Gastric  . BIOPSY  01/19/2015   Procedure: BIOPSY (GASTRIC ULCER);  Surgeon: Danie Binder, MD;  Location: [facility];  Service: Endoscopy;;  . CARDIAC CATHETERIZATION    . cardiac stents     x5 stents  . COLONOSCOPY  June 2008   Dr. Dellis Filbert Medoff: Mild left colonic diverticulosis  . CORONARY ARTERY BYPASS GRAFT N/A 11/11/2016   Procedure: CORONARY ARTERY BYPASS GRAFTING (CABG) x 3, USING LEFT MAMMARY ARTERY AND RIGHT GREATER SAPHENOUS VEIN HARVESTED ENDOSCOPICALLY;  Surgeon: Ivin Poot, MD;  Location: [place];  Service: Open Heart Surgery;  Laterality: N/A;  . Dental extractions    . ESOPHAGOGASTRODUODENOSCOPY  June 2008   Dr. Dellis Filbert Medoff: Gastric ulcer, antral, biopsy with reactive changes associated glandular atrophy, no H pylori. No features of lymphoma. Likely NSAID related  . ESOPHAGOGASTRODUODENOSCOPY  August 2008   Dr. Dellis Filbert Medoff: Complete healing of gastric ulcers.  . ESOPHAGOGASTRODUODENOSCOPY (EGD) WITH PROPOFOL N/A 08/15/2014   SLF: 1. Abnormal pain due to ulcers & gastriitis   . ESOPHAGOGASTRODUODENOSCOPY (EGD) WITH PROPOFOL N/A 01/19/2015   SLF: 1. Persistant  ulcer with firm base 2. single gastric polyp found in the gastric antrum. Ulcer base   . EUS N/A 03/21/2015   Thickened gastric wall in pre-pyloric antrum, no obvious intramural mass, fatty pancreas, no obvious pancreatic mass.   . Inguinal lymph node biopsy     right inguinal lymph node biopsy  . INTRAVASCULAR PRESSURE WIRE/FFR STUDY N/A 09/26/2016   Procedure: Intravascular Pressure Wire/FFR Study;  Surgeon: Nelva Bush, MD;  Location: [place] [facility];  Service: Cardiovascular;  Laterality: N/A;  . LEFT HEART CATH AND CORONARY ANGIOGRAPHY N/A 09/26/2016   Procedure: Left Heart Cath and Coronary Angiography;  Surgeon: Nelva Bush, MD;  Location: [place] [facility];   Service: Cardiovascular;  Laterality: N/A;  . NECK SURGERY    . PACEMAKER IMPLANT N/A 03/12/2020   Procedure: PACEMAKER IMPLANT;  Surgeon: Vickie Epley, MD;  Location: [place] [facility];  Service: Cardiovascular;  Laterality: N/A;  . POLYPECTOMY  01/19/2015   Procedure: POLYPECTOMY (GASTRIC);  Surgeon: Danie Binder, MD;  Location[facility];  Service: Endoscopy;;  . SHOULDER ARTHROSCOPY Left   . TEE WITHOUT CARDIOVERSION N/A 11/11/2016   Procedure: TRANSESOPHAGEAL ECHOCARDIOGRAM (TEE);  Surgeon: Prescott Gum, Collier Salina, MD;  Location: [place];  Service: Open Heart Surgery;  Laterality: N/A;    Current Medications: Current Meds  Medication Sig  . acetaminophen (TYLENOL) 500 MG tablet Take 500-1,000 mg by mouth every 6 (six) hours as needed (for pain.).  Marland Kitchen amLODipine (NORVASC) 2.5 MG tablet Take 1 tablet (2.5 mg total) by mouth daily.  . Ascorbic Acid (VITAMIN C) 500 MG CAPS Take 500 mg by mouth daily.  Marland Kitchen aspirin EC 81 MG tablet Take 1 tablet (81 mg total) by mouth daily.  . B Complex-C (B-COMPLEX WITH VITAMIN C) tablet Take 1 tablet by mouth daily.  . celecoxib (CELEBREX) 200 MG capsule Take 200 mg by mouth daily.  . Cholecalciferol (VITAMIN D3) 50 MCG (2000 UT) TABS Take 2,000 Units by mouth daily.  . Continuous Blood Gluc Sensor (FREESTYLE LIBRE SENSOR SYSTEM) MISC Use one sensor every 10 days.  . ferrous sulfate 325 (65 FE) MG tablet Take 325 mg by mouth daily.  Marland Kitchen FLUoxetine (PROZAC) 20 MG capsule Take 20 mg by mouth daily.  Marland Kitchen gabapentin (NEURONTIN) 600 MG tablet Take 600 mg by mouth at bedtime.  Marland Kitchen glucose blood (ONETOUCH VERIO) test strip USE AS DIRECTED FOUR- FIVE TIMES DAILY  . Insulin Glargine (BASAGLAR KWIKPEN) 100 UNIT/ML SOPN INJECT 80 UNITS INTO SKIN DAILY AT 10 PM (Patient taking differently: Inject 10 Units into the skin at bedtime as needed (Depending on sugar level).)  . Multiple Vitamin (MULTIVITAMIN WITH MINERALS) TABS tablet Take 1 tablet by mouth in the morning and at bedtime.   . nitroGLYCERIN (NITROSTAT) 0.4 MG SL tablet Place 1 tablet (0.4 mg total) under the tongue every 5 (five) minutes x 3 doses as needed for chest pain.  Glory Rosebush VERIO test strip USE TO TEST FOUR TIMES DAILY AS DIRECTED  . oxyCODONE (OXY IR/ROXICODONE) 5 MG immediate release tablet Take 5-10 mg by mouth 2 (two) times daily as needed (pain.).   Marland Kitchen pantoprazole (PROTONIX) 40 MG tablet TAKE 1 TABLET BY MOUTH DAILY BEFORE BREAKFAST (Patient taking differently: Take 40 mg by mouth daily before breakfast.)  . Pediatric Multivitamins-Iron (FLINTSTONES COMPLETE PO) Take 1 tablet by mouth daily.  . simvastatin (ZOCOR) 10 MG tablet TAKE 1 TABLET BY MOUTH EVERY NIGHT AT BEDTIME (Patient taking differently: Take 10 mg by mouth at  bedtime.)     Allergies:   Patient has no known allergies.   Social History   Socioeconomic History  . Marital status: Married    Spouse name: Not on file  . Number of children: Not on file  . Years of education: Not on file  . Highest education level: Not on file  Occupational History  . Not on file  Tobacco Use  . Smoking status: Former Smoker    Packs/day: 1.00    Years: 23.00    Pack years: 23.00    Types: Cigarettes    Start date: 10/30/1970    Quit date: 10/29/1993    Years since quitting: 26.6  . Smokeless tobacco: Former Systems developer    Types: Snuff, Chew  Vaping Use  . Vaping Use: Never used  Substance and Sexual Activity  . Alcohol use: No    Alcohol/week: 0.0 standard drinks  . Drug use: No  . Sexual activity: Never    Birth control/protection: None  Other Topics Concern  . Not on file  Social History Narrative  . Not on file   Social Determinants of Health   Financial Resource Strain: Not on file  Food Insecurity: Not on file  Transportation Needs: Not on file  Physical Activity: Not on file  Stress: Not on file  Social Connections: Not on file     Family History: The patient's family history includes Diabetes in his father; Heart disease in  his father; Hypertension in his mother; Obesity in his mother; Pancreatic cancer in his maternal grandmother; Thyroid disease in his father. There is no history of Colon cancer.  ROS:   Please see the history of present illness.    All other systems reviewed and are negative.  EKGs/Labs/Other Studies Reviewed:    The following studies were reviewed today:  May 16, 2020 SPECT  Lexiscan stress is electrically nondiagnostic due to pacing  Myovue scan shows mild inferior defect consistent with scar and possible soft tissue attenuation No significant ischemia  LVEF 42% septal hypkinesis  Overall intermediate risk scan  No signficant change from study in 2013  June 18, 2020 device interrogation personally reviewed Presenting rhythm a sensed V paced DDDR 60-1 20 Atrial capture threshold 1.25 V at 1 ms, sensing 2.4 mV, impedance 440 ohms Ventricular capture threshold 0.75 at 0.5, sensing 8.7 mV, impedance 560 ohms Longevity estimated at approximately 8 years  EKG:  The ekg ordered today demonstrates a sensed V paced rhythm.  Recent Labs: 02/10/2020: ALT 28; TSH 1.659 03/28/2020: BUN 18; Creatinine, Ser 0.92; Hemoglobin 12.1; Platelets 108; Potassium 4.2; Sodium 137  Recent Lipid Panel    Component Value Date/Time   CHOL 107 03/02/2017 1615   TRIG 164 (H) 03/02/2017 1615   HDL 39 (L) 03/02/2017 1615   CHOLHDL 2.7 03/02/2017 1615   VLDL 56 (H) 01/28/2016 1602   LDLCALC 44 03/02/2017 1615    Physical Exam:    VS:  BP (!) 154/60   Pulse 67   Ht 6\' 4"  (1.93 m)   Wt 227 lb (103 kg)   SpO2 99%   BMI 27.63 kg/m     Wt Readings from Last 3 Encounters:  06/18/20 227 lb (103 kg)  05/04/20 224 lb 12.8 oz (102 kg)  04/02/20 236 lb (107 kg)     GEN:  Well nourished, well developed in no acute distress HEENT: Normal NECK: No JVD; No carotid bruits LYMPHATICS: No lymphadenopathy CARDIAC: RRR, no murmurs, rubs, gallops RESPIRATORY:  Clear to auscultation  without rales,  wheezing or rhonchi  ABDOMEN: Soft, non-tender, non-distended MUSCULOSKELETAL:  No edema; No deformity  SKIN: Warm and dry NEUROLOGIC:  Alert and oriented x 3 PSYCHIATRIC:  Normal affect   ASSESSMENT:    1. Trifascicular block   2. Symptomatic bradycardia   3. Coronary artery disease of native artery of native heart with stable angina pectoris ([place])    PLAN:    In order of problems listed above:  1. Trifascicular block Patient is requiring 99% ventricular pacing.  Atrially pacing 32% of the time.  Lead diagnostics are stable.  Battery longevity is good.  2.  Chest pain Concerning for ischemia given the severity the pain and its typical nature.  He describes the pain is somewhat similar to the pain he had with his first heart attacks that led ultimately to his bypass surgery.  Would like to proceed with a left heart catheterization.  On aspirin 81 mg daily.  Follow-up 3 months  Medication Adjustments/Labs and Tests Ordered: Current medicines are reviewed at length with the patient today.  Concerns regarding medicines are outlined above.  Orders Placed This Encounter  Procedures  . EKG 12-Lead   No orders of the defined types were placed in this encounter.    Signed, Lars Mage, MD, [facility]  06/18/2020 2:58 PM    Electrophysiology [place]

## 2020-06-19 LAB — BASIC METABOLIC PANEL
BUN/Creatinine Ratio: 20 (ref 10–24)
BUN: 16 mg/dL (ref 8–27)
CO2: 23 mmol/L (ref 20–29)
Calcium: 8.8 mg/dL (ref 8.6–10.2)
Chloride: 105 mmol/L (ref 96–106)
Creatinine, Ser: 0.79 mg/dL (ref 0.76–1.27)
GFR calc Af Amer: 111 mL/min/{1.73_m2} (ref >59)
GFR calc non Af Amer: 96 mL/min/{1.73_m2} (ref >59)
Glucose: 189 mg/dL — ABNORMAL HIGH (ref 65–99)
Potassium: 4.1 mmol/L (ref 3.5–5.2)
Sodium: 141 mmol/L (ref 134–144)

## 2020-06-19 LAB — CBC
Hematocrit: 34.8 % — ABNORMAL LOW (ref 37.5–51.0)
Hemoglobin: 11.6 g/dL — ABNORMAL LOW (ref 13.0–17.7)
MCH: 32.5 pg (ref 26.6–33.0)
MCHC: 33.3 g/dL (ref 31.5–35.7)
MCV: 98 fL — ABNORMAL HIGH (ref 79–97)
Platelets: 138 10*3/uL — ABNORMAL LOW (ref 150–450)
RBC: 3.57 x10E6/uL — ABNORMAL LOW (ref 4.14–5.80)
RDW: 11.8 % (ref 11.6–15.4)
WBC: 5.5 10*3/uL (ref 3.4–10.8)

## 2020-06-25 NOTE — Progress Notes (Addendum)
Remote pacemaker transmission.   

## 2020-06-27 ENCOUNTER — Other Ambulatory Visit (HOSPITAL_COMMUNITY)
Admission: RE | Admit: 2020-06-27 | Discharge: 2020-06-27 | Disposition: A | Discharge status: Home / Self Care | Payer: BC Managed Care – PPO | Source: Ambulatory Visit | Attending: Cardiovascular Disease | Admitting: Cardiovascular Disease

## 2020-06-27 DIAGNOSIS — Z950 Presence of cardiac pacemaker: Secondary | ICD-10-CM | POA: Diagnosis not present

## 2020-06-27 DIAGNOSIS — Z951 Presence of aortocoronary bypass graft: Secondary | ICD-10-CM | POA: Diagnosis not present

## 2020-06-27 DIAGNOSIS — Z8249 Family history of ischemic heart disease and other diseases of the circulatory system: Secondary | ICD-10-CM | POA: Diagnosis not present

## 2020-06-27 DIAGNOSIS — Z7982 Long term (current) use of aspirin: Secondary | ICD-10-CM | POA: Diagnosis not present

## 2020-06-27 DIAGNOSIS — I453 Trifascicular block: Secondary | ICD-10-CM | POA: Diagnosis not present

## 2020-06-27 DIAGNOSIS — Z20822 Contact with and (suspected) exposure to covid-19: Secondary | ICD-10-CM | POA: Diagnosis not present

## 2020-06-27 DIAGNOSIS — Z01812 Encounter for preprocedural laboratory examination: Secondary | ICD-10-CM | POA: Insufficient documentation

## 2020-06-27 DIAGNOSIS — Z79899 Other long term (current) drug therapy: Secondary | ICD-10-CM | POA: Diagnosis not present

## 2020-06-27 DIAGNOSIS — R001 Bradycardia, unspecified: Secondary | ICD-10-CM | POA: Diagnosis not present

## 2020-06-27 DIAGNOSIS — I25118 Atherosclerotic heart disease of native coronary artery with other forms of angina pectoris: Secondary | ICD-10-CM | POA: Diagnosis present

## 2020-06-27 DIAGNOSIS — Z794 Long term (current) use of insulin: Secondary | ICD-10-CM | POA: Diagnosis not present

## 2020-06-27 DIAGNOSIS — Z87891 Personal history of nicotine dependence: Secondary | ICD-10-CM | POA: Diagnosis not present

## 2020-06-27 LAB — SARS CORONAVIRUS 2 (TAT 6-24 HRS): SARS Coronavirus 2: NEGATIVE

## 2020-06-29 ENCOUNTER — Encounter (HOSPITAL_COMMUNITY)
Admission: RE | Disposition: A | Discharge status: Home / Self Care | Payer: Self-pay | Source: Home / Self Care | Attending: Cardiovascular Disease

## 2020-06-29 ENCOUNTER — Other Ambulatory Visit: Payer: Self-pay

## 2020-06-29 ENCOUNTER — Ambulatory Visit (HOSPITAL_COMMUNITY)
Admission: RE | Admit: 2020-06-29 | Discharge: 2020-06-29 | Disposition: A | Discharge status: Home / Self Care | Payer: BC Managed Care – PPO | Attending: Cardiovascular Disease | Admitting: Cardiovascular Disease

## 2020-06-29 DIAGNOSIS — Z79899 Other long term (current) drug therapy: Secondary | ICD-10-CM | POA: Insufficient documentation

## 2020-06-29 DIAGNOSIS — Z951 Presence of aortocoronary bypass graft: Secondary | ICD-10-CM | POA: Insufficient documentation

## 2020-06-29 DIAGNOSIS — I25118 Atherosclerotic heart disease of native coronary artery with other forms of angina pectoris: Secondary | ICD-10-CM | POA: Diagnosis not present

## 2020-06-29 DIAGNOSIS — Z8249 Family history of ischemic heart disease and other diseases of the circulatory system: Secondary | ICD-10-CM | POA: Insufficient documentation

## 2020-06-29 DIAGNOSIS — I453 Trifascicular block: Secondary | ICD-10-CM | POA: Insufficient documentation

## 2020-06-29 DIAGNOSIS — Z794 Long term (current) use of insulin: Secondary | ICD-10-CM | POA: Insufficient documentation

## 2020-06-29 DIAGNOSIS — Z950 Presence of cardiac pacemaker: Secondary | ICD-10-CM | POA: Insufficient documentation

## 2020-06-29 DIAGNOSIS — Z7982 Long term (current) use of aspirin: Secondary | ICD-10-CM | POA: Insufficient documentation

## 2020-06-29 DIAGNOSIS — R001 Bradycardia, unspecified: Secondary | ICD-10-CM | POA: Insufficient documentation

## 2020-06-29 DIAGNOSIS — Z20822 Contact with and (suspected) exposure to covid-19: Secondary | ICD-10-CM | POA: Insufficient documentation

## 2020-06-29 DIAGNOSIS — Z87891 Personal history of nicotine dependence: Secondary | ICD-10-CM | POA: Insufficient documentation

## 2020-06-29 DIAGNOSIS — I25119 Atherosclerotic heart disease of native coronary artery with unspecified angina pectoris: Secondary | ICD-10-CM | POA: Diagnosis not present

## 2020-06-29 HISTORY — PX: LEFT HEART CATH AND CORS/GRAFTS ANGIOGRAPHY: CATH118250

## 2020-06-29 LAB — GLUCOSE, CAPILLARY: Glucose-Capillary: 131 mg/dL — ABNORMAL HIGH (ref 70–99)

## 2020-06-29 SURGERY — LEFT HEART CATH AND CORS/GRAFTS ANGIOGRAPHY
Anesthesia: LOCAL

## 2020-06-29 MED ORDER — HEPARIN (PORCINE) IN NACL 1000-0.9 UT/500ML-% IV SOLN
INTRAVENOUS | Status: DC | PRN
  Administered 2020-06-29 (×2): 500 mL

## 2020-06-29 MED ORDER — MIDAZOLAM HCL 2 MG/2ML IJ SOLN
INTRAMUSCULAR | Status: AC
  Filled 2020-06-29: qty 2

## 2020-06-29 MED ORDER — VERAPAMIL HCL 2.5 MG/ML IV SOLN
INTRAVENOUS | Status: AC
  Filled 2020-06-29: qty 2

## 2020-06-29 MED ORDER — HEPARIN (PORCINE) IN NACL 1000-0.9 UT/500ML-% IV SOLN
INTRAVENOUS | Status: AC
  Filled 2020-06-29: qty 1000

## 2020-06-29 MED ORDER — SODIUM CHLORIDE 0.9 % IV SOLN: 250.0000 mL | INTRAVENOUS | Status: DC | PRN

## 2020-06-29 MED ORDER — IOHEXOL 350 MG/ML SOLN
INTRAVENOUS | Status: AC
  Filled 2020-06-29: qty 1

## 2020-06-29 MED ORDER — ONDANSETRON HCL 4 MG/2ML IJ SOLN: 4.0000 mg | Freq: Four times a day (QID) | INTRAMUSCULAR | Status: DC | PRN

## 2020-06-29 MED ORDER — SODIUM CHLORIDE 0.9% FLUSH: 3.0000 mL | INTRAVENOUS | Status: DC | PRN

## 2020-06-29 MED ORDER — SODIUM CHLORIDE 0.9% FLUSH: 3.0000 mL | Freq: Two times a day (BID) | INTRAVENOUS | Status: DC

## 2020-06-29 MED ORDER — ACETAMINOPHEN 325 MG PO TABS: 650.0000 mg | ORAL_TABLET | ORAL | Status: DC | PRN

## 2020-06-29 MED ORDER — LABETALOL HCL 5 MG/ML IV SOLN: 10.0000 mg | INTRAVENOUS | Status: DC | PRN

## 2020-06-29 MED ORDER — ASPIRIN 81 MG PO CHEW: 81.0000 mg | CHEWABLE_TABLET | ORAL | Status: DC

## 2020-06-29 MED ORDER — SODIUM CHLORIDE 0.9 % WEIGHT BASED INFUSION
1.0000 mL/kg/h | INTRAVENOUS | Status: DC
  Administered 2020-06-29: 1 mL/kg/h via INTRAVENOUS

## 2020-06-29 MED ORDER — SODIUM CHLORIDE 0.9 % WEIGHT BASED INFUSION: 1.0000 mL/kg/h | INTRAVENOUS | Status: DC

## 2020-06-29 MED ORDER — LIDOCAINE HCL (PF) 1 % IJ SOLN
INTRAMUSCULAR | Status: AC
  Filled 2020-06-29: qty 30

## 2020-06-29 MED ORDER — ISOSORBIDE MONONITRATE ER 30 MG PO TB24: 15.0000 mg | ORAL_TABLET | Freq: Every day | ORAL | 11 refills | Status: DC

## 2020-06-29 MED ORDER — FENTANYL CITRATE (PF) 100 MCG/2ML IJ SOLN
INTRAMUSCULAR | Status: AC
  Filled 2020-06-29: qty 2

## 2020-06-29 MED ORDER — VERAPAMIL HCL 2.5 MG/ML IV SOLN
INTRAVENOUS | Status: DC | PRN
  Administered 2020-06-29: 10 mL via INTRA_ARTERIAL

## 2020-06-29 MED ORDER — MIDAZOLAM HCL 2 MG/2ML IJ SOLN
INTRAMUSCULAR | Status: DC | PRN
  Administered 2020-06-29: 2 mg via INTRAVENOUS
  Administered 2020-06-29: 1 mg via INTRAVENOUS

## 2020-06-29 MED ORDER — SODIUM CHLORIDE 0.9 % WEIGHT BASED INFUSION
3.0000 mL/kg/h | INTRAVENOUS | Status: AC
  Administered 2020-06-29: 3 mL/kg/h via INTRAVENOUS

## 2020-06-29 MED ORDER — FENTANYL CITRATE (PF) 100 MCG/2ML IJ SOLN
INTRAMUSCULAR | Status: DC | PRN
  Administered 2020-06-29 (×2): 25 ug via INTRAVENOUS

## 2020-06-29 MED ORDER — LIDOCAINE HCL (PF) 1 % IJ SOLN
INTRAMUSCULAR | Status: DC | PRN
  Administered 2020-06-29: 2 mL

## 2020-06-29 MED ORDER — IOHEXOL 350 MG/ML SOLN
INTRAVENOUS | Status: DC | PRN
  Administered 2020-06-29: 55 mL

## 2020-06-29 MED ORDER — HYDRALAZINE HCL 20 MG/ML IJ SOLN
10.0000 mg | INTRAMUSCULAR | Status: DC | PRN
Start: 2020-06-29 — End: 2020-06-29

## 2020-06-29 MED ORDER — HEPARIN SODIUM (PORCINE) 1000 UNIT/ML IJ SOLN
INTRAMUSCULAR | Status: DC | PRN
  Administered 2020-06-29: 5000 [IU] via INTRAVENOUS

## 2020-06-29 MED ORDER — HEPARIN SODIUM (PORCINE) 1000 UNIT/ML IJ SOLN
INTRAMUSCULAR | Status: AC
  Filled 2020-06-29: qty 1

## 2020-06-29 SURGICAL SUPPLY — 14 items
BAG SNAP BAND KOVER 36X36 (MISCELLANEOUS) ×1 IMPLANT
CATH EXPO 5F MPA-1 (CATHETERS) ×1 IMPLANT
CATH INFINITI 5FR MULTPACK ANG (CATHETERS) ×1 IMPLANT
COVER DOME SNAP 22 D (MISCELLANEOUS) ×1 IMPLANT
DEVICE RAD COMP TR BAND LRG (VASCULAR PRODUCTS) ×1 IMPLANT
GLIDESHEATH SLEND SS 6F .021 (SHEATH) ×1 IMPLANT
GUIDEWIRE INQWIRE 1.5J.035X260 (WIRE) IMPLANT
INQWIRE 1.5J .035X260CM (WIRE) ×2
KIT HEART LEFT (KITS) ×2 IMPLANT
PACK CARDIAC CATHETERIZATION (CUSTOM PROCEDURE TRAY) ×2 IMPLANT
SHEATH PROBE COVER 6X72 (BAG) ×1 IMPLANT
TRANSDUCER W/STOPCOCK (MISCELLANEOUS) ×2 IMPLANT
TUBING CIL FLEX 10 FLL-RA (TUBING) ×2 IMPLANT
WIRE HI TORQ VERSACORE-J 145CM (WIRE) ×1 IMPLANT

## 2020-06-29 NOTE — Interval H&P Note (Signed)
Cath Lab Visit (complete for each Cath Lab visit)  Clinical Evaluation Leading to the Procedure:   ACS: No.  Non-ACS:    Anginal Classification: CCS III  Anti-ischemic medical therapy: Minimal Therapy (1 class of medications)  Non-Invasive Test Results: No non-invasive testing performed  Prior CABG: Previous CABG      History and Physical Interval Note:  06/29/2020 12:53 PM  Pacer Kelly [name]  has presented today for surgery, with the diagnosis of chest pain.  The various methods of treatment have been discussed with the patient and family. After consideration of risks, benefits and other options for treatment, the patient has consented to  Procedure(s): LEFT HEART CATH AND CORS/GRAFTS ANGIOGRAPHY (N/A) as a surgical intervention.  The patient's history has been reviewed, patient examined, no change in status, stable for surgery.  I have reviewed the patient's chart and labs.  Questions were answered to the patient's satisfaction.     Sherren Mocha

## 2020-06-29 NOTE — Progress Notes (Signed)
Pt ambulated without difficulty or bleeding.   Discharged home with wife who will drive and stay with pt x 24 hrs 

## 2020-06-29 NOTE — Discharge Instructions (Signed)
Radial Site Care  This sheet gives you information about how to care for yourself after your procedure. Your health care provider may also give you more specific instructions. If you have problems or questions, contact your health care provider. What can I expect after the procedure? After the procedure, it is common to have:  Bruising and tenderness at the catheter insertion area. Follow these instructions at home: Medicines  Take over-the-counter and prescription medicines only as told by your health care provider. Insertion site care 1. Follow instructions from your health care provider about how to take care of your insertion site. Make sure you: ? Wash your hands with soap and water before you remove your bandage (dressing). If soap and water are not available, use hand sanitizer. ? May remove dressing in 24 hours. 2. Check your insertion site every day for signs of infection. Check for: ? Redness, swelling, or pain. ? Fluid or blood. ? Pus or a bad smell. ? Warmth. 3. Do no take baths, swim, or use a hot tub for 5 days. 4. You may shower 24-48 hours after the procedure. ? Remove the dressing and gently wash the site with plain soap and water. ? Pat the area dry with a clean towel. ? Do not rub the site. That could cause bleeding. 5. Do not apply powder or lotion to the site. Activity  1. For 24 hours after the procedure, or as directed by your health care provider: ? Do not flex or bend the affected arm. ? Do not push or pull heavy objects with the affected arm. ? Do not drive yourself home from the hospital or clinic. You may drive 24 hours after the procedure. ? Do not operate machinery or power tools. ? KEEP ARM ELEVATED THE REMAINDER OF THE DAY. 2. Do not push, pull or lift anything that is heavier than 10 lb for 5 days. 3. Ask your health care provider when it is okay to: ? Return to work or school. ? Resume usual physical activities or sports. ? Resume sexual  activity. General instructions  If the catheter site starts to bleed, raise your arm and put firm pressure on the site. If the bleeding does not stop, get help right away. This is a medical emergency.  DRINK PLENTY OF FLUIDS FOR THE NEXT 2-3 DAYS.  No alcohol consumption for 24 hours after receiving sedation.  If you went home on the same day as your procedure, a responsible adult should be with you for the first 24 hours after you arrive home.  Keep all follow-up visits as told by your health care provider. This is important. Contact a health care provider if:  You have a fever.  You have redness, swelling, or yellow drainage around your insertion site. Get help right away if:  You have unusual pain at the radial site.  The catheter insertion area swells very fast.  The insertion area is bleeding, and the bleeding does not stop when you hold steady pressure on the area.  Your arm or hand becomes pale, cool, tingly, or numb. These symptoms may represent a serious problem that is an emergency. Do not wait to see if the symptoms will go away. Get medical help right away. Call your local emergency services (911 in the [place] Do not drive yourself to the hospital. Summary  After the procedure, it is common to have bruising and tenderness at the site.  Follow instructions from your health care provider about how to take care   of your radial site wound. Check the wound every day for signs of infection.  This information is not intended to replace advice given to you by your health care provider. Make sure you discuss any questions you have with your health care provider. Document Revised: 06/10/2017 Document Reviewed: 06/10/2017 Elsevier Patient Education  2020 [employer] 

## 2020-07-02 ENCOUNTER — Encounter (HOSPITAL_COMMUNITY): Payer: Self-pay | Admitting: Cardiovascular Disease

## 2020-09-11 ENCOUNTER — Ambulatory Visit: Payer: BC Managed Care – PPO | Admitting: Cardiology

## 2020-09-11 ENCOUNTER — Other Ambulatory Visit: Payer: Self-pay

## 2020-09-11 ENCOUNTER — Ambulatory Visit (INDEPENDENT_AMBULATORY_CARE_PROVIDER_SITE_OTHER): Payer: BC Managed Care – PPO

## 2020-09-11 ENCOUNTER — Encounter: Payer: Self-pay | Admitting: Cardiology

## 2020-09-11 DIAGNOSIS — I453 Trifascicular block: Secondary | ICD-10-CM

## 2020-09-11 DIAGNOSIS — Z950 Presence of cardiac pacemaker: Secondary | ICD-10-CM | POA: Diagnosis not present

## 2020-09-11 MED ORDER — ISOSORBIDE MONONITRATE ER 30 MG PO TB24: 30.0000 mg | ORAL_TABLET | Freq: Every day | ORAL | 3 refills | Status: DC

## 2020-09-11 NOTE — Patient Instructions (Addendum)
Medication Instructions:  Your physician has recommended you make the following change in your medication:   1.  INCREASE your IMDUR 30 mg-  Take one tablet by mouth daily.  *If you need a refill on your cardiac medications before your next appointment, please call your pharmacy*  Lab Work: None ordered. If you have labs (blood work) drawn today and your tests are completely normal, you will receive your results only by: Marland Kitchen MyChart Message (if you have MyChart) OR . A paper copy in the mail If you have any lab test that is abnormal or we need to change your treatment, we will call you to review the results.  Testing/Procedures: None ordered.  Follow-Up: At [facility], you and your health needs are our priority.  As part of our continuing mission to provide you with exceptional heart care, we have created designated Provider Care Teams.  These Care Teams include your primary Cardiologist (physician) and Advanced Practice Providers (APPs -  Physician Assistants and Nurse Practitioners) who all work together to provide you with the care you need, when you need it.  Your next appointment:   Your physician wants you to follow-up in: one year with Dr. Quentin Ore.   You will receive a reminder letter in the mail two months in advance. If you don't receive a letter, please call our office to schedule the follow-up appointment.  Remote monitoring is used to monitor your Pacemaker from home. This monitoring reduces the number of office visits required to check your device to one time per year. It allows [place] to keep an eye on the functioning of your device to ensure it is working properly. You are scheduled for a device check from home on 12/11/2020. You may send your transmission at any time that day. If you have a wireless device, the transmission will be sent automatically. After your physician reviews your transmission, you will receive a postcard with your next transmission date.

## 2020-09-11 NOTE — Progress Notes (Signed)
Electrophysiology Office Follow up Visit Note:    Date:  09/11/2020   ID:  Kelly [name], DOB Feb 23, 1956, MRN [id number]  PCP:  Celene Squibb, MD  [facility] Cardiologist:  Rozann Lesches, MD  [facility] Electrophysiologist:  Vickie Epley, MD    Interval History:    Kelly [name] is a 65 y.o. male who presents for a follow up visit.  I last saw the patient in June 18, 2020.  During that appointment he expressed concern about chest pain that was similar to the pain that led to his bypass surgery.  We set him up for a left heart catheterization which was performed June 29, 2020 by Dr. Burt Knack.  Heart catheterization showed patent LIMA and vein grafts.  There was downstream small vessel disease that was not amenable to PCI.  He was started on Imdur.  He says since his heart catheterization he has been doing overall okay.  He has had 1 episode of chest pain that required a sublingual nitroglycerin.  He still very active and working.     Past Medical History:  Diagnosis Date  . Anxiety   . Arthritis   . Cirrhosis ([place])   . Coronary atherosclerosis of native coronary artery    BMS LAD and PTCA RCA 2000,BMS RCA/PLA 2001, CABG June 2018  . Diverticulitis   . Essential hypertension   . Fatty liver   . Gastric ulcer   . GERD (gastroesophageal reflux disease)   . Hyperlipidemia   . Low-grade NHL (non-Hodgkin's lymphoma) 2007  . Lupus anticoagulant positive   . MI (myocardial infarction) ([place]) 2001  . Pacemaker   . Peripheral neuropathy   . Thrombocytopenia ([place])   . Trifascicular block   . Type 2 diabetes mellitus ([place])   . Umbilical hernia     Past Surgical History:  Procedure Laterality Date  . Axillary abscess     left incision and drainage left  . BACK SURGERY     multiple  . BIOPSY  08/15/2014   Procedure: BIOPSY;  Surgeon: Danie Binder, MD;  Location: [facility];  Service: Endoscopy;;  Gastric  . BIOPSY  01/19/2015   Procedure: BIOPSY (GASTRIC ULCER);   Surgeon: Danie Binder, MD;  Location: [facility];  Service: Endoscopy;;  . CARDIAC CATHETERIZATION    . cardiac stents     x5 stents  . COLONOSCOPY  June 2008   Dr. Dellis Filbert Medoff: Mild left colonic diverticulosis  . CORONARY ARTERY BYPASS GRAFT N/A 11/11/2016   Procedure: CORONARY ARTERY BYPASS GRAFTING (CABG) x 3, USING LEFT MAMMARY ARTERY AND RIGHT GREATER SAPHENOUS VEIN HARVESTED ENDOSCOPICALLY;  Surgeon: Ivin Poot, MD;  Location: [place];  Service: Open Heart Surgery;  Laterality: N/A;  . Dental extractions    . ESOPHAGOGASTRODUODENOSCOPY  June 2008   Dr. Dellis Filbert Medoff: Gastric ulcer, antral, biopsy with reactive changes associated glandular atrophy, no H pylori. No features of lymphoma. Likely NSAID related  . ESOPHAGOGASTRODUODENOSCOPY  August 2008   Dr. Dellis Filbert Medoff: Complete healing of gastric ulcers.  . ESOPHAGOGASTRODUODENOSCOPY (EGD) WITH PROPOFOL N/A 08/15/2014   SLF: 1. Abnormal pain due to ulcers & gastriitis   . ESOPHAGOGASTRODUODENOSCOPY (EGD) WITH PROPOFOL N/A 01/19/2015   SLF: 1. Persistant ulcer with firm base 2. single gastric polyp found in the gastric antrum. Ulcer base   . EUS N/A 03/21/2015   Thickened gastric wall in pre-pyloric antrum, no obvious intramural mass, fatty pancreas, no obvious pancreatic mass.   . Inguinal lymph node  biopsy     right inguinal lymph node biopsy  . INTRAVASCULAR PRESSURE WIRE/FFR STUDY N/A 09/26/2016   Procedure: Intravascular Pressure Wire/FFR Study;  Surgeon: Nelva Bush, MD;  Location: [place] [facility];  Service: Cardiovascular;  Laterality: N/A;  . LEFT HEART CATH AND CORONARY ANGIOGRAPHY N/A 09/26/2016   Procedure: Left Heart Cath and Coronary Angiography;  Surgeon: Nelva Bush, MD;  Location: [place] [facility];  Service: Cardiovascular;  Laterality: N/A;  . LEFT HEART CATH AND CORS/GRAFTS ANGIOGRAPHY N/A 06/29/2020   Procedure: LEFT HEART CATH AND CORS/GRAFTS ANGIOGRAPHY;  Surgeon: Sherren Mocha, MD;  Location: [place] [facility];  Service: Cardiovascular;  Laterality: N/A;  . NECK SURGERY    . PACEMAKER IMPLANT N/A 03/12/2020   Procedure: PACEMAKER IMPLANT;  Surgeon: Vickie Epley, MD;  Location: [place] [facility];  Service: Cardiovascular;  Laterality: N/A;  . POLYPECTOMY  01/19/2015   Procedure: POLYPECTOMY (GASTRIC);  Surgeon: Danie Binder, MD;  Location: [facility];  Service: Endoscopy;;  . SHOULDER ARTHROSCOPY Left   . TEE WITHOUT CARDIOVERSION N/A 11/11/2016   Procedure: TRANSESOPHAGEAL ECHOCARDIOGRAM (TEE);  Surgeon: Prescott Gum, Collier Salina, MD;  Location: [place];  Service: Open Heart Surgery;  Laterality: N/A;    Current Medications: Current Meds  Medication Sig  . acetaminophen (TYLENOL) 500 MG tablet Take 500-1,000 mg by mouth every 6 (six) hours as needed (for pain.).  Marland Kitchen Ascorbic Acid (VITAMIN C) 500 MG CAPS Take 500 mg by mouth daily.  Marland Kitchen aspirin EC 81 MG tablet Take 1 tablet (81 mg total) by mouth daily.  . B Complex-C (B-COMPLEX WITH VITAMIN C) tablet Take 1 tablet by mouth daily.  . celecoxib (CELEBREX) 200 MG capsule Take 200 mg by mouth daily.  . Cholecalciferol (VITAMIN D3) 50 MCG (2000 UT) TABS Take 2,000 Units by mouth daily.  . Continuous Blood Gluc Sensor (FREESTYLE LIBRE SENSOR SYSTEM) MISC Use one sensor every 10 days.  . ferrous sulfate 325 (65 FE) MG tablet Take 325 mg by mouth daily.  Marland Kitchen FLUoxetine (PROZAC) 20 MG capsule Take 20 mg by mouth daily.  Marland Kitchen gabapentin (NEURONTIN) 600 MG tablet Take 600 mg by mouth at bedtime.  Marland Kitchen glucose blood (ONETOUCH VERIO) test strip USE AS DIRECTED FOUR- FIVE TIMES DAILY  . Insulin Glargine (BASAGLAR KWIKPEN) 100 UNIT/ML SOPN INJECT 80 UNITS INTO SKIN DAILY AT 10 PM  . isosorbide mononitrate (IMDUR) 30 MG 24 hr tablet Take 1 tablet (30 mg total) by mouth daily.  . Multiple Vitamin (MULTIVITAMIN WITH MINERALS) TABS tablet Take 1 tablet by mouth in the morning and at bedtime.  . nitroGLYCERIN (NITROSTAT) 0.4 MG SL tablet Place 1 tablet (0.4 mg total)  under the tongue every 5 (five) minutes x 3 doses as needed for chest pain.  Glory Rosebush VERIO test strip USE TO TEST FOUR TIMES DAILY AS DIRECTED  . oxyCODONE-acetaminophen (PERCOCET/ROXICET) 5-325 MG tablet Take 1 tablet by mouth every 4 (four) hours as needed for severe pain.  . pantoprazole (PROTONIX) 40 MG tablet TAKE 1 TABLET BY MOUTH DAILY BEFORE BREAKFAST  . Pediatric Multivitamins-Iron (FLINTSTONES COMPLETE PO) Take 1 tablet by mouth daily.  . simvastatin (ZOCOR) 10 MG tablet TAKE 1 TABLET BY MOUTH EVERY NIGHT AT BEDTIME  . Sodium Bicarbonate-Citric Acid (ALKA-SELTZER HEARTBURN) 1940-1000 MG TBEF Take 1-2 tablets by mouth 2 (two) times daily as needed (heartburn/indigestion).  . [DISCONTINUED] isosorbide mononitrate (IMDUR) 30 MG 24 hr tablet Take 0.5 tablets (15 mg total) by mouth daily.  Allergies:   Patient has no known allergies.   Social History   Socioeconomic History  . Marital status: Married    Spouse name: Not on file  . Number of children: Not on file  . Years of education: Not on file  . Highest education level: Not on file  Occupational History  . Not on file  Tobacco Use  . Smoking status: Former Smoker    Packs/day: 1.00    Years: 23.00    Pack years: 23.00    Types: Cigarettes    Start date: 10/30/1970    Quit date: 10/29/1993    Years since quitting: 26.8  . Smokeless tobacco: Former Systems developer    Types: Snuff, Chew  Vaping Use  . Vaping Use: Never used  Substance and Sexual Activity  . Alcohol use: No    Alcohol/week: 0.0 standard drinks  . Drug use: No  . Sexual activity: Never    Birth control/protection: None  Other Topics Concern  . Not on file  Social History Narrative  . Not on file   Social Determinants of Health   Financial Resource Strain: Not on file  Food Insecurity: Not on file  Transportation Needs: Not on file  Physical Activity: Not on file  Stress: Not on file  Social Connections: Not on file     Family History: The  patient's family history includes Diabetes in his father; Heart disease in his father; Hypertension in his mother; Obesity in his mother; Pancreatic cancer in his maternal grandmother; Thyroid disease in his father. There is no history of Colon cancer.  ROS:   Please see the history of present illness.    All other systems reviewed and are negative.  EKGs/Labs/Other Studies Reviewed:    The following studies were reviewed today:  September 11, 2020 in clinic device interrogation personally reviewed DDDR 60-1 20 We did change his atrial lead outputs from 2.5 V at 1 ms to 1.5 V at 0.6 ms unipolar with improvement of his battery longevity to 9.2 years from 7.4 years.  Ventricular lead parameters are stable. We also enabled auto capture in the V.   EKG:  The ekg ordered today demonstrates a sensed V paced Recent Labs: 02/10/2020: ALT 28; TSH 1.659 06/18/2020: BUN 16; Creatinine, Ser 0.79; Hemoglobin 11.6; Platelets 138; Potassium 4.1; Sodium 141  Recent Lipid Panel    Component Value Date/Time   CHOL 107 03/02/2017 1615   TRIG 164 (H) 03/02/2017 1615   HDL 39 (L) 03/02/2017 1615   CHOLHDL 2.7 03/02/2017 1615   VLDL 56 (H) 01/28/2016 1602   LDLCALC 44 03/02/2017 1615    Physical Exam:    VS:  BP 126/72   Pulse 68   Ht 6\' 4"  (1.93 m)   Wt 227 lb (103 kg)   SpO2 96%   BMI 27.63 kg/m     Wt Readings from Last 3 Encounters:  09/11/20 227 lb (103 kg)  06/29/20 215 lb (97.5 kg)  06/18/20 227 lb (103 kg)     GEN:  Well nourished, well developed in no acute distress HEENT: Normal NECK: No JVD; No carotid bruits LYMPHATICS: No lymphadenopathy CARDIAC: RRR, 2/6 crescendo decrescendo systolic ejection murmur loudest at the upper sternal borders.  No rubs, gallops RESPIRATORY:  Clear to auscultation without rales, wheezing or rhonchi  ABDOMEN: Soft, non-tender, non-distended MUSCULOSKELETAL:  No edema; No deformity  SKIN: Warm and dry NEUROLOGIC:  Alert and oriented x 3 PSYCHIATRIC:   Normal affect   ASSESSMENT:  1. Trifascicular block   2. Pacemaker    PLAN:    In order of problems listed above:  1.  Trifascicular block post permanent pacemaker implant Pacemaker is well-functioning. Adjustments made today to optimize battery longevity.   2.  Mild to moderate aortic stenosis We will plan to repeat echocardiogram in the fall to reassess his aortic valve.  Follow-up 1 year.      Medication Adjustments/Labs and Tests Ordered: Current medicines are reviewed at length with the patient today.  Concerns regarding medicines are outlined above.  Orders Placed This Encounter  Procedures  . EKG 12-Lead   Meds ordered this encounter  Medications  . isosorbide mononitrate (IMDUR) 30 MG 24 hr tablet    Sig: Take 1 tablet (30 mg total) by mouth daily.    Dispense:  90 tablet    Refill:  3     Signed, Lars Mage, MD, [facility], [facility] 09/11/2020 3:37 PM    Electrophysiology [facility]

## 2020-09-12 LAB — CUP PACEART REMOTE DEVICE CHECK
Battery Remaining Longevity: 108 mo
Battery Remaining Percentage: 95.5 %
Battery Voltage: 3.01 V
Brady Statistic AP VP Percent: 14 %
Brady Statistic AP VS Percent: 1 %
Brady Statistic AS VP Percent: 84 %
Brady Statistic AS VS Percent: 1.3 %
Brady Statistic RA Percent Paced: 13 %
Brady Statistic RV Percent Paced: 98 %
Date Time Interrogation Session: 20220426235945
Implantable Lead Implant Date: 20211025
Implantable Lead Implant Date: 20211025
Implantable Lead Location: 753859
Implantable Lead Location: 753860
Implantable Pulse Generator Implant Date: 20211025
Lead Channel Impedance Value: 300 Ohm
Lead Channel Impedance Value: 460 Ohm
Lead Channel Pacing Threshold Amplitude: 0.75 V
Lead Channel Pacing Threshold Amplitude: 1.5 V
Lead Channel Pacing Threshold Pulse Width: 0.4 ms
Lead Channel Pacing Threshold Pulse Width: 0.6 ms
Lead Channel Sensing Intrinsic Amplitude: 2.8 mV
Lead Channel Sensing Intrinsic Amplitude: 7.4 mV
Lead Channel Setting Pacing Amplitude: 1 V
Lead Channel Setting Pacing Amplitude: 2.5 V
Lead Channel Setting Pacing Pulse Width: 0.4 ms
Lead Channel Setting Sensing Sensitivity: 2.5 mV
Pulse Gen Model: 2272
Pulse Gen Serial Number: 3873891

## 2020-10-02 NOTE — Progress Notes (Signed)
Remote pacemaker transmission.   

## 2020-10-16 NOTE — Progress Notes (Signed)
[facility] [address] [place] [address]   CLINIC:  Medical Oncology/Hematology  CONSULT NOTE  Patient Care Team: Celene Squibb, MD as PCP - General (Internal Medicine) Satira Sark, MD as PCP - Cardiology (Cardiology) Vickie Epley, MD as PCP - Electrophysiology (Cardiology) Danie Binder, MD (Inactive) as Consulting Physician (Gastroenterology) Dasher, Jeneen Rinks, MD as Consulting Physician (Surgery)  CHIEF COMPLAINTS/PURPOSE OF CONSULTATION:  Evaluation of NHL and normocytic anemia  HISTORY OF PRESENTING ILLNESS:  [name] 65 y.o. 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 Nevada Crane.  Today he reports feeling good. He had a gastric bypass in 2019 and lost 158 lbs. He denies fevers, infections, night sweats, or unexpected weight loss within that last 6 months. He is currently experiencing CP and palpitations which have been present for several months; he is seeing a cardiologist every 3 months and had a pacemaker placed. He denies bloody stool. He has never had a blood transfusion. He reports a history of DVT; multiple episodes of blood clots in the legs treated with Coumadin for 6 months following each incident. He had a lymph node removed from his right leg, and it is constantly swollen. He takes a multivitamin BID, magnesium, and iron daily.   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 His paternal GM had pancreatic cancer. He denies family history of lymphoma or miscarriages. He quit smoking around 1995; he smoked about 1 ppd for 20-25 years. His father took blood thinners, but he cannot recall if he had blood clots. Maternal GM passed from a stroke in her 2s.   MEDICAL HISTORY:  Past Medical History:  Diagnosis Date  . Anxiety   . Arthritis   . Cirrhosis ([place])   . Coronary atherosclerosis of native coronary artery    BMS LAD and PTCA RCA 2000,BMS  RCA/PLA 2001, CABG June 2018  . Diverticulitis   . Essential hypertension   . Fatty liver   . Gastric ulcer   . GERD (gastroesophageal reflux disease)   . Hyperlipidemia   . Low-grade NHL (non-Hodgkin's lymphoma) 2007  . Lupus anticoagulant positive   . MI (myocardial infarction) ([place]) 2001  . Pacemaker   . Peripheral neuropathy   . Thrombocytopenia ([place])   . Trifascicular block   . Type 2 diabetes mellitus ([place])   . Umbilical hernia     SURGICAL HISTORY: Past Surgical History:  Procedure Laterality Date  . Axillary abscess     left incision and drainage left  . BACK SURGERY     multiple  . BIOPSY  08/15/2014   Procedure: BIOPSY;  Surgeon: Danie Binder, MD;  Location: [facility];  Service: Endoscopy;;  Gastric  . BIOPSY  01/19/2015   Procedure: BIOPSY (GASTRIC ULCER);  Surgeon: Danie Binder, MD;  Location: [facility];  Service: Endoscopy;;  . CARDIAC CATHETERIZATION    . cardiac stents     x5 stents  . COLONOSCOPY  June 2008   Dr. Dellis Filbert Medoff: Mild left colonic diverticulosis  . CORONARY ARTERY BYPASS GRAFT N/A 11/11/2016   Procedure: CORONARY ARTERY BYPASS GRAFTING (CABG) x 3, USING LEFT MAMMARY ARTERY AND RIGHT GREATER SAPHENOUS VEIN HARVESTED ENDOSCOPICALLY;  Surgeon: Ivin Poot, MD;  Location: [facility];  Service: Open Heart Surgery;  Laterality: N/A;  . Dental extractions    . ESOPHAGOGASTRODUODENOSCOPY  June 2008   Dr. Dellis Filbert Medoff: Gastric ulcer,  antral, biopsy with reactive changes associated glandular atrophy, no H pylori. No features of lymphoma. Likely NSAID related  . ESOPHAGOGASTRODUODENOSCOPY  August 2008   Dr. Dellis Filbert Medoff: Complete healing of gastric ulcers.  . ESOPHAGOGASTRODUODENOSCOPY (EGD) WITH PROPOFOL N/A 08/15/2014   SLF: 1. Abnormal pain due to ulcers & gastriitis   . ESOPHAGOGASTRODUODENOSCOPY (EGD) WITH PROPOFOL N/A 01/19/2015   SLF: 1. Persistant ulcer with firm base 2. single gastric polyp found in the gastric antrum. Ulcer base   . EUS N/A  03/21/2015   Thickened gastric wall in pre-pyloric antrum, no obvious intramural mass, fatty pancreas, no obvious pancreatic mass.   . Inguinal lymph node biopsy     right inguinal lymph node biopsy  . INTRAVASCULAR PRESSURE WIRE/FFR STUDY N/A 09/26/2016   Procedure: Intravascular Pressure Wire/FFR Study;  Surgeon: Nelva Bush, MD;  Location: [facility];  Service: Cardiovascular;  Laterality: N/A;  . LEFT HEART CATH AND CORONARY ANGIOGRAPHY N/A 09/26/2016   Procedure: Left Heart Cath and Coronary Angiography;  Surgeon: Nelva Bush, MD;  Location: [facility];  Service: Cardiovascular;  Laterality: N/A;  . LEFT HEART CATH AND CORS/GRAFTS ANGIOGRAPHY N/A 06/29/2020   Procedure: LEFT HEART CATH AND CORS/GRAFTS ANGIOGRAPHY;  Surgeon: Sherren Mocha, MD;  Location: [facility];  Service: Cardiovascular;  Laterality: N/A;  . NECK SURGERY    . PACEMAKER IMPLANT N/A 03/12/2020   Procedure: PACEMAKER IMPLANT;  Surgeon: Vickie Epley, MD;  Location: [facility];  Service: Cardiovascular;  Laterality: N/A;  . POLYPECTOMY  01/19/2015   Procedure: POLYPECTOMY (GASTRIC);  Surgeon: Danie Binder, MD;  Location: [facility];  Service: Endoscopy;;  . ROUX-EN-Y GASTRIC BYPASS     RNY gastric bypass  . SHOULDER ARTHROSCOPY Left   . TEE WITHOUT CARDIOVERSION N/A 11/11/2016   Procedure: TRANSESOPHAGEAL ECHOCARDIOGRAM (TEE);  Surgeon: Prescott Gum, Collier Salina, MD;  Location: [facility];  Service: Open Heart Surgery;  Laterality: N/A;    SOCIAL HISTORY: Social History   Socioeconomic History  . Marital status: Married    Spouse name: Not on file  . Number of children: Not on file  . Years of education: Not on file  . Highest education level: Not on file  Occupational History  . Not on file  Tobacco Use  . Smoking status: Former Smoker    Packs/day: 1.00    Years: 23.00    Pack years: 23.00    Types: Cigarettes    Start date: 10/30/1970    Quit date: 10/29/1993    Years since  quitting: 26.9  . Smokeless tobacco: Former Systems developer    Types: Snuff, Chew  Vaping Use  . Vaping Use: Never used  Substance and Sexual Activity  . Alcohol use: No    Alcohol/week: 0.0 standard drinks  . Drug use: No  . Sexual activity: Never    Birth control/protection: None  Other Topics Concern  . Not on file  Social History Narrative  . Not on file   Social Determinants of Health   Financial Resource Strain: High Risk  . Difficulty of Paying Living Expenses: Hard  Food Insecurity: No Food Insecurity  . Worried About Charity fundraiser in the Last Year: Never true  . Ran Out of Food in the Last Year: Never true  Transportation Needs: No Transportation Needs  . Lack of Transportation (Medical): No  . Lack of Transportation (Non-Medical): No  Physical Activity: Sufficiently Active  . Days of Exercise per Week: 7 days  .  Minutes of Exercise per Session: 60 min  Stress: Stress Concern Present  . Feeling of Stress : Very much  Social Connections: Socially Integrated  . Frequency of Communication with Friends and Family: More than three times a week  . Frequency of Social Gatherings with Friends and Family: Never  . Attends Religious Services: More than 4 times per year  . Active Member of Clubs or Organizations: Yes  . Attends Archivist Meetings: More than 4 times per year  . Marital Status: Married  Human resources officer Violence: Not At Risk  . Fear of Current or Ex-Partner: No  . Emotionally Abused: No  . Physically Abused: No  . Sexually Abused: No    FAMILY HISTORY: Family History  Problem Relation Age of Onset  . Pancreatic cancer Maternal Grandmother   . Obesity Mother   . Hypertension Mother   . Diabetes Father   . Thyroid disease Father   . Heart disease Father   . Colon cancer Neg Hx     ALLERGIES:  has No Known Allergies.  MEDICATIONS:  Current Outpatient Medications  Medication Sig Dispense Refill  . acetaminophen (TYLENOL) 500 MG tablet Take  500-1,000 mg by mouth every 6 (six) hours as needed (for pain.).    Marland Kitchen Ascorbic Acid (VITAMIN C) 500 MG CAPS Take 500 mg by mouth daily.    Marland Kitchen aspirin EC 81 MG tablet Take 1 tablet (81 mg total) by mouth daily. 30 tablet 11  . B Complex-C (B-COMPLEX WITH VITAMIN C) tablet Take 1 tablet by mouth daily.    . celecoxib (CELEBREX) 200 MG capsule Take 200 mg by mouth daily.    . Cholecalciferol (VITAMIN D3) 50 MCG (2000 UT) TABS Take 2,000 Units by mouth daily.    . Continuous Blood Gluc Sensor (FREESTYLE LIBRE SENSOR SYSTEM) MISC Use one sensor every 10 days. 3 each 2  . ferrous sulfate 325 (65 FE) MG tablet Take 325 mg by mouth daily.    Marland Kitchen FLUoxetine (PROZAC) 20 MG capsule Take 20 mg by mouth daily.    Marland Kitchen gabapentin (NEURONTIN) 600 MG tablet Take 600 mg by mouth at bedtime.    Marland Kitchen glucose blood (ONETOUCH VERIO) test strip USE AS DIRECTED FOUR- FIVE TIMES DAILY 450 each 1  . Insulin Glargine (BASAGLAR KWIKPEN) 100 UNIT/ML SOPN INJECT 80 UNITS INTO SKIN DAILY AT 10 PM 30 mL 2  . isosorbide mononitrate (IMDUR) 30 MG 24 hr tablet Take 1 tablet (30 mg total) by mouth daily. 90 tablet 3  . Magnesium 250 MG TABS Take by mouth.    . Multiple Vitamin (MULTIVITAMIN WITH MINERALS) TABS tablet Take 1 tablet by mouth in the morning and at bedtime.    . nitroGLYCERIN (NITROSTAT) 0.4 MG SL tablet Place 1 tablet (0.4 mg total) under the tongue every 5 (five) minutes x 3 doses as needed for chest pain. 25 tablet 3  . ONETOUCH VERIO test strip USE TO TEST FOUR TIMES DAILY AS DIRECTED 450 each 2  . oxyCODONE-acetaminophen (PERCOCET/ROXICET) 5-325 MG tablet Take 1 tablet by mouth every 4 (four) hours as needed for severe pain.    . pantoprazole (PROTONIX) 40 MG tablet TAKE 1 TABLET BY MOUTH DAILY BEFORE BREAKFAST 90 tablet 0  . Pediatric Multivitamins-Iron (FLINTSTONES COMPLETE PO) Take 1 tablet by mouth daily.    . simvastatin (ZOCOR) 10 MG tablet TAKE 1 TABLET BY MOUTH EVERY NIGHT AT BEDTIME 30 tablet 2  . Sodium  Bicarbonate-Citric Acid (ALKA-SELTZER HEARTBURN) 1940-1000 MG TBEF Take  1-2 tablets by mouth 2 (two) times daily as needed (heartburn/indigestion).    Marland Kitchen amLODipine (NORVASC) 2.5 MG tablet Take 1 tablet (2.5 mg total) by mouth daily. 90 tablet 3   No current facility-administered medications for this visit.    REVIEW OF SYSTEMS:   Review of Systems  Constitutional: Negative for appetite change, fatigue, fever and unexpected weight change.  Cardiovascular: Positive for chest pain and palpitations.  Gastrointestinal: Negative for blood in stool.  Neurological: Positive for numbness (hands and feet).  All other systems reviewed and are negative.    PHYSICAL EXAMINATION: ECOG PERFORMANCE STATUS: 1 - Symptomatic but completely ambulatory  Vitals:   10/17/20 0805  BP: 115/66  Pulse: 60  Resp: 18  Temp: (!) 97 F (36.1 C)  SpO2: 100%   Filed Weights   10/17/20 0805  Weight: 227 lb 6.4 oz (103.1 kg)   Physical Exam Vitals reviewed.  Constitutional:      Appearance: Normal appearance.  Cardiovascular:     Rate and Rhythm: Normal rate and regular rhythm.     Pulses: Normal pulses.     Heart sounds: Normal heart sounds.  Pulmonary:     Effort: Pulmonary effort is normal.     Breath sounds: Normal breath sounds.  Chest:  Breasts:     Right: No axillary adenopathy or supraclavicular adenopathy.     Left: No axillary adenopathy or supraclavicular adenopathy.    Abdominal:     Palpations: Abdomen is soft. There is no hepatomegaly, splenomegaly or mass.     Tenderness: There is no abdominal tenderness.  Musculoskeletal:     Right lower leg: Edema (2+) present.     Left lower leg: No edema.  Lymphadenopathy:     Cervical: No cervical adenopathy.     Right cervical: No superficial cervical adenopathy.    Left cervical: No superficial cervical adenopathy.     Upper Body:     Right upper body: No supraclavicular, axillary or pectoral adenopathy.     Left upper body: No  supraclavicular, axillary or pectoral adenopathy.  Neurological:     General: No focal deficit present.     Mental Status: He is alert and oriented to person, place, and time.  Psychiatric:        Mood and Affect: Mood normal.        Behavior: Behavior normal.      LABORATORY DATA:  I have reviewed the data as listed CBC Latest Ref Rng & Units 10/17/2020 06/18/2020 03/28/2020  WBC 4.0 - 10.5 K/uL 5.5 5.5 4.9  Hemoglobin 13.0 - 17.0 g/dL 11.9(L) 11.6(L) 12.1(L)  Hematocrit 39.0 - 52.0 % 35.6(L) 34.8(L) 36.4(L)  Platelets 150 - 400 K/uL 118(L) 138(L) 108(L)   CMP Latest Ref Rng & Units 06/18/2020 03/28/2020 03/12/2020  Glucose 65 - 99 mg/dL 189(H) 180(H) 128(H)  BUN 8 - 27 mg/dL 16 18 11   Creatinine 0.76 - 1.27 mg/dL 0.79 0.92 0.99  Sodium 134 - 144 mmol/L 141 137 140  Potassium 3.5 - 5.2 mmol/L 4.1 4.2 3.9  Chloride 96 - 106 mmol/L 105 100 104  CO2 20 - 29 mmol/L 23 29 27   Calcium 8.6 - 10.2 mg/dL 8.8 9.0 9.3  Total Protein 6.5 - 8.1 g/dL - - -  Total Bilirubin 0.3 - 1.2 mg/dL - - -  Alkaline Phos 38 - 126 U/L - - -  AST 15 - 41 U/L - - -  ALT 0 - 44 U/L - - -    RADIOGRAPHIC  STUDIES: I have personally reviewed the radiological images as listed and agreed with the findings in the report. No results found.  ASSESSMENT:  1.  Low-grade follicular lymphoma: - Right inguinal lymph node excision biopsy on 09/15/2005 consistent with grade 2 follicular lymphoma, mixed cell type.  Cells are positive for Bcl-2, CD20 and CD79a.  CD10 is negative. - He was under watchful observation and did not require any treatment over the years. - Last CT CAP on 05/29/2017 at [facility] showed enlarged mediastinal nodes, largest 1.7 x 2.4 cm right infrahilar node.  Prominent but not enlarged abdominal retroperitoneal nodes.  Right middle lobe 7 mm nodule.  Fatty cirrhotic liver.  Normal spleen.  2.  Social/family history: - He works as a Water engineer at [employer].  He quit smoking in 1995.  He  smoked 1 pack/day for 20+ years. - Paternal grandmother had pancreatic cancer.  Maternal grandmother died of CVA in her 66s.  Father was on blood thinners.  3.  Normocytic anemia: - Last colonoscopy was in 2008 which showed normal colon with diverticulosis on the left side. - EGD on 03/21/2015 showed nodularity and ulcerations and atrophy seen in prepyloric antrum.  EUS showed thickened gastric wall in the prepyloric antrum with no mass.  PLAN:  1.  Low-grade follicular lymphoma: -He does not report any fevers, night sweats or weight loss.  No recurrent infections. - No palpable adenopathy or splenomegaly on physical exam today. - No indication for treatment at this time.  2.  Normocytic anemia: - He has mild normocytic anemia at Dr. Juel Burrow clinic on 06/29/2020 with hemoglobin 12.9. - Recommend nutritional deficiency work-up including X44, folic acid, copper, methylmalonic acid levels along with ferritin and iron panel.  We will also check for hemolysis with LDH and reticulocyte count. - RTC 2 to 3 weeks for follow-up and discuss results.  3.  Mild thrombocytopenia: - Differential includes immune mediated thrombocytopenia. - Will rule out nutritional deficiencies.  We will also check SPEP.  4.  Recurrent DVTs: - He reportedly had DVT x4 in his legs.  Denies any pulmonary embolism.  Last Doppler on 04/06/2018 was negative for DVT.  He has right leg lymphedema from lymphadenectomy.  Also has chronic venous stasis changes in both legs. - As per history he has lupus anticoagulant.  We will check for lupus anticoagulant, anticardiolipin antibodies and antibeta-2 glycoprotein 1 antibodies.  He is not on any anticoagulation at this time.  All questions were answered. The patient knows to call the clinic with any problems, questions or concerns.    Kelly Jack, MD, 10/17/20 6:07 PM  [facility] [phone]   I, Kelly [name], am acting as a scribe for Dr. Derek [name].  I, Kelly Jack MD, have reviewed the above documentation for accuracy and completeness, and I agree with the above.

## 2020-10-17 ENCOUNTER — Inpatient Hospital Stay (HOSPITAL_COMMUNITY): Payer: BC Managed Care – PPO

## 2020-10-17 ENCOUNTER — Other Ambulatory Visit: Payer: Self-pay

## 2020-10-17 ENCOUNTER — Encounter (HOSPITAL_COMMUNITY): Payer: Self-pay | Admitting: Hematology

## 2020-10-17 ENCOUNTER — Inpatient Hospital Stay (HOSPITAL_COMMUNITY): Payer: BC Managed Care – PPO | Attending: Hematology | Admitting: Hematology

## 2020-10-17 VITALS — BP 115/66 | HR 60 | Temp 97.0°F | Resp 18 | Ht 76.0 in | Wt 227.4 lb

## 2020-10-17 DIAGNOSIS — Z87891 Personal history of nicotine dependence: Secondary | ICD-10-CM | POA: Insufficient documentation

## 2020-10-17 DIAGNOSIS — D649 Anemia, unspecified: Secondary | ICD-10-CM

## 2020-10-17 DIAGNOSIS — Z950 Presence of cardiac pacemaker: Secondary | ICD-10-CM | POA: Diagnosis not present

## 2020-10-17 DIAGNOSIS — C8295 Follicular lymphoma, unspecified, lymph nodes of inguinal region and lower limb: Secondary | ICD-10-CM | POA: Diagnosis present

## 2020-10-17 DIAGNOSIS — I878 Other specified disorders of veins: Secondary | ICD-10-CM | POA: Insufficient documentation

## 2020-10-17 DIAGNOSIS — D696 Thrombocytopenia, unspecified: Secondary | ICD-10-CM

## 2020-10-17 DIAGNOSIS — Z9884 Bariatric surgery status: Secondary | ICD-10-CM | POA: Diagnosis not present

## 2020-10-17 DIAGNOSIS — C8590 Non-Hodgkin lymphoma, unspecified, unspecified site: Secondary | ICD-10-CM

## 2020-10-17 DIAGNOSIS — Z86718 Personal history of other venous thrombosis and embolism: Secondary | ICD-10-CM | POA: Diagnosis not present

## 2020-10-17 DIAGNOSIS — I898 Other specified noninfective disorders of lymphatic vessels and lymph nodes: Secondary | ICD-10-CM | POA: Diagnosis not present

## 2020-10-17 DIAGNOSIS — Z800 Family history of malignant neoplasm of digestive organs: Secondary | ICD-10-CM

## 2020-10-17 LAB — CBC WITH DIFFERENTIAL/PLATELET
Abs Immature Granulocytes: 0.02 10*3/uL (ref 0.00–0.07)
Basophils Absolute: 0 10*3/uL (ref 0.0–0.1)
Basophils Relative: 1 %
Eosinophils Absolute: 0.2 10*3/uL (ref 0.0–0.5)
Eosinophils Relative: 4 %
HCT: 35.6 % — ABNORMAL LOW (ref 39.0–52.0)
Hemoglobin: 11.9 g/dL — ABNORMAL LOW (ref 13.0–17.0)
Immature Granulocytes: 0 %
Lymphocytes Relative: 27 %
Lymphs Abs: 1.5 10*3/uL (ref 0.7–4.0)
MCH: 33.1 pg (ref 26.0–34.0)
MCHC: 33.4 g/dL (ref 30.0–36.0)
MCV: 98.9 fL (ref 80.0–100.0)
Monocytes Absolute: 0.5 10*3/uL (ref 0.1–1.0)
Monocytes Relative: 9 %
Neutro Abs: 3.3 10*3/uL (ref 1.7–7.7)
Neutrophils Relative %: 59 %
Platelets: 118 10*3/uL — ABNORMAL LOW (ref 150–400)
RBC: 3.6 MIL/uL — ABNORMAL LOW (ref 4.22–5.81)
RDW: 11.7 % (ref 11.5–15.5)
WBC: 5.5 10*3/uL (ref 4.0–10.5)
nRBC: 0 % (ref 0.0–0.2)

## 2020-10-17 LAB — IRON AND TIBC
Iron: 115 ug/dL (ref 45–182)
Saturation Ratios: 47 % — ABNORMAL HIGH (ref 17.9–39.5)
TIBC: 246 ug/dL — ABNORMAL LOW (ref 250–450)
UIBC: 131 ug/dL

## 2020-10-17 LAB — LACTATE DEHYDROGENASE: LDH: 137 U/L (ref 98–192)

## 2020-10-17 LAB — VITAMIN B12: Vitamin B-12: 490 pg/mL (ref 180–914)

## 2020-10-17 LAB — RETICULOCYTES
Immature Retic Fract: 7.1 % (ref 2.3–15.9)
RBC.: 3.61 MIL/uL — ABNORMAL LOW (ref 4.22–5.81)
Retic Count, Absolute: 42.2 10*3/uL (ref 19.0–186.0)
Retic Ct Pct: 1.2 % (ref 0.4–3.1)

## 2020-10-17 LAB — FOLATE: Folate: 42.2 ng/mL (ref >5.9)

## 2020-10-17 LAB — FERRITIN: Ferritin: 234 ng/mL (ref 24–336)

## 2020-10-17 NOTE — Patient Instructions (Signed)
[place] at [facility] Discharge Instructions  You were seen and examined today by Dr. Delton Coombes. Dr. Delton Coombes is a medical oncologist and hematologist, meaning he specializes both in blood disorders and cancer diagnoses. Dr. Delton Coombes discussed your past medical history, family history of cancer and the events that led to you being here today.  You were referred to Dr. Delton Coombes by Dr. Nevada Crane due to your history of Non-Hodgkin's Lymphoma. On your lab work from February, you were mildly anemic. Dr. Delton Coombes has recommended repeat blood work to check your blood counts, iron levels and vitamins to see if there is an identifiable cause for the anemia. Dr. Delton Coombes has also recommended that you collect your stool on 3 separate occasions to see if there is any blood loss through your stool. Anemia is fairly common following gastric bypass due to the change in your stomach's anatomy. Because there is also a family history of blood clots, Dr. Delton Coombes has recommended blood work to see if there is any cause for higher risk of blood clots.  Dr. Delton Coombes will see you again in 3-4 weeks.   Thank you for choosing [place] at [facility] to provide your oncology and hematology care.  To afford each patient quality time with our provider, please arrive at least 15 minutes before your scheduled appointment time.   If you have a lab appointment with the [facility] please come in thru the Main Entrance and check in at the main information desk.  You need to re-schedule your appointment should you arrive 10 or more minutes late.  We strive to give you quality time with our providers, and arriving late affects you and other patients whose appointments are after yours.  Also, if you no show three or more times for appointments you may be dismissed from the clinic at the providers discretion.     Again, thank you for choosing [facility].  Our  hope is that these requests will decrease the amount of time that you wait before being seen by our physicians.       _____________________________________________________________  Should you have questions after your visit to [facility], please contact our office at [phone] and follow the prompts.  Our office hours are 8:00 a.m. and 4:30 p.m. Monday - Friday.  Please note that voicemails left after 4:00 p.m. may not be returned until the following business day.  We are closed weekends and major holidays.  You do have access to a nurse 24-7, just call the main number to the clinic [phone] and do not press any options, hold on the line and a nurse will answer the phone.    For prescription refill requests, have your pharmacy contact our office and allow 72 hours.    Due to Covid, you will need to wear a mask upon entering the hospital. If you do not have a mask, a mask will be given to you at the Main Entrance upon arrival. For doctor visits, patients may have 1 support person age 71 or older with them. For treatment visits, patients can not have anyone with them due to social distancing guidelines and our immunocompromised population.

## 2020-10-18 DIAGNOSIS — C8295 Follicular lymphoma, unspecified, lymph nodes of inguinal region and lower limb: Secondary | ICD-10-CM | POA: Diagnosis not present

## 2020-10-18 LAB — OCCULT BLOOD X 1 CARD TO LAB, STOOL
Fecal Occult Bld: NEGATIVE
Fecal Occult Bld: NEGATIVE
Fecal Occult Bld: NEGATIVE

## 2020-10-18 LAB — COPPER, SERUM: Copper: 83 ug/dL (ref 69–132)

## 2020-10-19 LAB — BETA-2-GLYCOPROTEIN I ABS, IGG/M/A
Beta-2 Glyco I IgG: <9 GPI IgG units (ref 0–20)
Beta-2-Glycoprotein I IgA: <9 GPI IgA units (ref 0–25)
Beta-2-Glycoprotein I IgM: <9 GPI IgM units (ref 0–32)

## 2020-10-19 LAB — PTT-LA MIX: PTT-LA Mix: 59.2 s — ABNORMAL HIGH (ref 0.0–48.9)

## 2020-10-19 LAB — HEXAGONAL PHASE PHOSPHOLIPID: Hexagonal Phase Phospholipid: 24 s — ABNORMAL HIGH (ref 0–11)

## 2020-10-19 LAB — DRVVT CONFIRM: dRVVT Confirm: 1.6 ratio — ABNORMAL HIGH (ref 0.8–1.2)

## 2020-10-19 LAB — LUPUS ANTICOAGULANT PANEL
DRVVT: 66.9 s — ABNORMAL HIGH (ref 0.0–47.0)
PTT Lupus Anticoagulant: 65.2 s — ABNORMAL HIGH (ref 0.0–51.9)

## 2020-10-19 LAB — CARDIOLIPIN ANTIBODIES, IGG, IGM, IGA
Anticardiolipin IgA: <9 APL U/mL (ref 0–11)
Anticardiolipin IgG: <9 GPL U/mL (ref 0–14)
Anticardiolipin IgM: 13 MPL U/mL — ABNORMAL HIGH (ref 0–12)

## 2020-10-19 LAB — DRVVT MIX: dRVVT Mix: 68.3 s — ABNORMAL HIGH (ref 0.0–40.4)

## 2020-10-20 LAB — METHYLMALONIC ACID, SERUM: Methylmalonic Acid, Quantitative: 159 nmol/L (ref 0–378)

## 2020-10-22 LAB — PROTEIN ELECTROPHORESIS, SERUM
A/G Ratio: 1.3 (ref 0.7–1.7)
Albumin ELP: 3.8 g/dL (ref 2.9–4.4)
Alpha-1-Globulin: 0.2 g/dL (ref 0.0–0.4)
Alpha-2-Globulin: 0.7 g/dL (ref 0.4–1.0)
Beta Globulin: 0.8 g/dL (ref 0.7–1.3)
Gamma Globulin: 1.4 g/dL (ref 0.4–1.8)
Globulin, Total: 3 g/dL (ref 2.2–3.9)
Total Protein ELP: 6.8 g/dL (ref 6.0–8.5)

## 2020-11-20 ENCOUNTER — Ambulatory Visit (HOSPITAL_COMMUNITY): Payer: BC Managed Care – PPO | Admitting: Hematology and Oncology

## 2020-11-30 ENCOUNTER — Inpatient Hospital Stay (HOSPITAL_COMMUNITY): Payer: BC Managed Care – PPO | Attending: Hematology | Admitting: Hematology and Oncology

## 2020-11-30 ENCOUNTER — Encounter (HOSPITAL_COMMUNITY): Payer: Self-pay | Admitting: Hematology and Oncology

## 2020-11-30 ENCOUNTER — Other Ambulatory Visit: Payer: Self-pay

## 2020-11-30 DIAGNOSIS — C8590 Non-Hodgkin lymphoma, unspecified, unspecified site: Secondary | ICD-10-CM | POA: Diagnosis not present

## 2020-11-30 DIAGNOSIS — D649 Anemia, unspecified: Secondary | ICD-10-CM | POA: Diagnosis not present

## 2020-11-30 DIAGNOSIS — C8295 Follicular lymphoma, unspecified, lymph nodes of inguinal region and lower limb: Secondary | ICD-10-CM | POA: Diagnosis not present

## 2020-11-30 DIAGNOSIS — R760 Raised antibody titer: Secondary | ICD-10-CM

## 2020-11-30 DIAGNOSIS — D696 Thrombocytopenia, unspecified: Secondary | ICD-10-CM | POA: Insufficient documentation

## 2020-11-30 DIAGNOSIS — Z87891 Personal history of nicotine dependence: Secondary | ICD-10-CM | POA: Insufficient documentation

## 2020-11-30 DIAGNOSIS — Z800 Family history of malignant neoplasm of digestive organs: Secondary | ICD-10-CM | POA: Diagnosis not present

## 2020-11-30 DIAGNOSIS — Z86718 Personal history of other venous thrombosis and embolism: Secondary | ICD-10-CM | POA: Diagnosis not present

## 2020-11-30 NOTE — Progress Notes (Signed)
[facility] [address], [place] [address]   CLINIC:  Medical Oncology/Hematology  CONSULT NOTE  Patient Care Team: Celene Squibb, MD as PCP - General (Internal Medicine) Satira Sark, MD as PCP - Cardiology (Cardiology) Vickie Epley, MD as PCP - Electrophysiology (Cardiology) Danie Binder, MD (Inactive) as Consulting Physician (Gastroenterology) Dasher, Jeneen Rinks, MD as Consulting Physician (Surgery)  CHIEF COMPLAINTS/PURPOSE OF CONSULTATION:  Evaluation of NHL and normocytic anemia  HISTORY OF PRESENTING ILLNESS:  Kelly [name] 65 y.o. male is here because of an evaluation of Non-Hodgkin lymphoma,, seen by Dr. Delton Coombes once in June 2022.  He is here for follow-up with his wife.  He does not have any new health complaints since his last visit.  He is very hard of hearing.  He had a couple episodes of chest pain and shortness of breath, took some nitroglycerin.  Chronic numbness and burning of his hands and his feet.  He has had non-Hodgkin's lymphoma/follicular lymphoma for 15 years and did not require any treatment.  He also has a history of multiple DVTs and lupus anticoagulant but he does not take any anticoagulation. Rest of the pertinent review of systems unremarkable today.  MEDICAL HISTORY:  Past Medical History:  Diagnosis Date   Anxiety    Arthritis    Cirrhosis ([facility])    Coronary atherosclerosis of native coronary artery    BMS LAD and PTCA RCA 2000,BMS RCA/PLA 2001, CABG June 2018   Diverticulitis    Essential hypertension    Fatty liver    Gastric ulcer    GERD (gastroesophageal reflux disease)    Hyperlipidemia    Low-grade NHL (non-Hodgkin's lymphoma) 2007   Lupus anticoagulant positive    MI (myocardial infarction) ([facility]) 2001   Pacemaker    Peripheral neuropathy    Thrombocytopenia ([facility])    Trifascicular block    Type 2 diabetes mellitus ([facility])    Umbilical hernia     SURGICAL HISTORY: Past Surgical History:   Procedure Laterality Date   Axillary abscess     left incision and drainage left   BACK SURGERY     multiple   BIOPSY  08/15/2014   Procedure: BIOPSY;  Surgeon: Danie Binder, MD;  Location: [facility];  Service: Endoscopy;;  Gastric   BIOPSY  01/19/2015   Procedure: BIOPSY (GASTRIC ULCER);  Surgeon: Danie Binder, MD;  Location: [facility];  Service: Endoscopy;;   CARDIAC CATHETERIZATION     cardiac stents     x5 stents   COLONOSCOPY  June 2008   Dr. Dellis Filbert Medoff: Mild left colonic diverticulosis   CORONARY ARTERY BYPASS GRAFT N/A 11/11/2016   Procedure: CORONARY ARTERY BYPASS GRAFTING (CABG) x 3, USING LEFT MAMMARY ARTERY AND RIGHT GREATER SAPHENOUS VEIN HARVESTED ENDOSCOPICALLY;  Surgeon: Ivin Poot, MD;  Location: [facility];  Service: Open Heart Surgery;  Laterality: N/A;   Dental extractions     ESOPHAGOGASTRODUODENOSCOPY  June 2008   Dr. Dellis Filbert Medoff: Gastric ulcer, antral, biopsy with reactive changes associated glandular atrophy, no H pylori. No features of lymphoma. Likely NSAID related   ESOPHAGOGASTRODUODENOSCOPY  August 2008   Dr. Dellis Filbert Medoff: Complete healing of gastric ulcers.   ESOPHAGOGASTRODUODENOSCOPY (EGD) WITH PROPOFOL N/A 08/15/2014   SLF: 1. Abnormal pain due to ulcers & gastriitis    ESOPHAGOGASTRODUODENOSCOPY (EGD) WITH PROPOFOL N/A 01/19/2015   SLF: 1. Persistant ulcer with firm base 2. single gastric polyp found in the gastric antrum. Ulcer base  EUS N/A 03/21/2015   Thickened gastric wall in pre-pyloric antrum, no obvious intramural mass, fatty pancreas, no obvious pancreatic mass.    Inguinal lymph node biopsy     right inguinal lymph node biopsy   INTRAVASCULAR PRESSURE WIRE/FFR STUDY N/A 09/26/2016   Procedure: Intravascular Pressure Wire/FFR Study;  Surgeon: Nelva Bush, MD;  Location: [facility];  Service: Cardiovascular;  Laterality: N/A;   LEFT HEART CATH AND CORONARY ANGIOGRAPHY N/A 09/26/2016   Procedure: Left Heart Cath and Coronary  Angiography;  Surgeon: Nelva Bush, MD;  Location: [facility];  Service: Cardiovascular;  Laterality: N/A;   LEFT HEART CATH AND CORS/GRAFTS ANGIOGRAPHY N/A 06/29/2020   Procedure: LEFT HEART CATH AND CORS/GRAFTS ANGIOGRAPHY;  Surgeon: Sherren Mocha, MD;  Location: [facility];  Service: Cardiovascular;  Laterality: N/A;   NECK SURGERY     PACEMAKER IMPLANT N/A 03/12/2020   Procedure: PACEMAKER IMPLANT;  Surgeon: Vickie Epley, MD;  Location: [facility];  Service: Cardiovascular;  Laterality: N/A;   POLYPECTOMY  01/19/2015   Procedure: POLYPECTOMY (GASTRIC);  Surgeon: Danie Binder, MD;  Location: [facility];  Service: Endoscopy;;   ROUX-EN-Y GASTRIC BYPASS     RNY gastric bypass   SHOULDER ARTHROSCOPY Left    TEE WITHOUT CARDIOVERSION N/A 11/11/2016   Procedure: TRANSESOPHAGEAL ECHOCARDIOGRAM (TEE);  Surgeon: Prescott Gum, Collier Salina, MD;  Location: [facility];  Service: Open Heart Surgery;  Laterality: N/A;    SOCIAL HISTORY: Social History   Socioeconomic History   Marital status: Married    Spouse name: Not on file   Number of children: Not on file   Years of education: Not on file   Highest education level: Not on file  Occupational History   Not on file  Tobacco Use   Smoking status: Former    Packs/day: 1.00    Years: 23.00    Pack years: 23.00    Types: Cigarettes    Start date: 10/30/1970    Quit date: 10/29/1993    Years since quitting: 27.1   Smokeless tobacco: Former    Types: Snuff, Chew  Vaping Use   Vaping Use: Never used  Substance and Sexual Activity   Alcohol use: No    Alcohol/week: 0.0 standard drinks   Drug use: No   Sexual activity: Never    Birth control/protection: None  Other Topics Concern   Not on file  Social History Narrative   Not on file   Social Determinants of Health   Financial Resource Strain: High Risk   Difficulty of Paying Living Expenses: Hard  Food Insecurity: No Food Insecurity   Worried About Running Out of  Food in the Last Year: Never true   Ran Out of Food in the Last Year: Never true  Transportation Needs: No Transportation Needs   Lack of Transportation (Medical): No   Lack of Transportation (Non-Medical): No  Physical Activity: Sufficiently Active   Days of Exercise per Week: 7 days   Minutes of Exercise per Session: 60 min  Stress: Stress Concern Present   Feeling of Stress : Very much  Social Connections: Socially Integrated   Frequency of Communication with Friends and Family: More than three times a week   Frequency of Social Gatherings with Friends and Family: Never   Attends Religious Services: More than 4 times per year   Active Member of [employer] or Organizations: Yes   Attends Archivist Meetings: More than 4 times per year  Marital Status: Married  Human resources officer Violence: Not At Risk   Fear of Current or Ex-Partner: No   Emotionally Abused: No   Physically Abused: No   Sexually Abused: No    FAMILY HISTORY: Family History  Problem Relation Age of Onset   Pancreatic cancer Maternal Grandmother    Obesity Mother    Hypertension Mother    Diabetes Father    Thyroid disease Father    Heart disease Father    Colon cancer Neg Hx     ALLERGIES:  has No Known Allergies.  MEDICATIONS:  Current Outpatient Medications  Medication Sig Dispense Refill   acetaminophen (TYLENOL) 500 MG tablet Take 500-1,000 mg by mouth every 6 (six) hours as needed (for pain.).     Ascorbic Acid (VITAMIN C) 500 MG CAPS Take 500 mg by mouth daily.     aspirin EC 81 MG tablet Take 1 tablet (81 mg total) by mouth daily. 30 tablet 11   B Complex-C (B-COMPLEX WITH VITAMIN C) tablet Take 1 tablet by mouth daily.     celecoxib (CELEBREX) 200 MG capsule Take 200 mg by mouth daily.     Cholecalciferol (VITAMIN D3) 50 MCG (2000 UT) TABS Take 2,000 Units by mouth daily.     Continuous Blood Gluc Sensor (FREESTYLE LIBRE SENSOR SYSTEM) MISC Use one sensor every 10 days. 3 each 2   ferrous  sulfate 325 (65 FE) MG tablet Take 325 mg by mouth daily.     FLUoxetine (PROZAC) 20 MG capsule Take 20 mg by mouth daily.     gabapentin (NEURONTIN) 600 MG tablet Take 600 mg by mouth at bedtime.     glucose blood (ONETOUCH VERIO) test strip USE AS DIRECTED FOUR- FIVE TIMES DAILY 450 each 1   Insulin Glargine (BASAGLAR KWIKPEN) 100 UNIT/ML SOPN INJECT 80 UNITS INTO SKIN DAILY AT 10 PM 30 mL 2   isosorbide mononitrate (IMDUR) 30 MG 24 hr tablet Take 1 tablet (30 mg total) by mouth daily. 90 tablet 3   Magnesium 250 MG TABS Take by mouth.     Multiple Vitamin (MULTIVITAMIN WITH MINERALS) TABS tablet Take 1 tablet by mouth in the morning and at bedtime.     ONETOUCH VERIO test strip USE TO TEST FOUR TIMES DAILY AS DIRECTED 450 each 2   oxyCODONE-acetaminophen (PERCOCET/ROXICET) 5-325 MG tablet Take 1 tablet by mouth every 4 (four) hours as needed for severe pain.     pantoprazole (PROTONIX) 40 MG tablet TAKE 1 TABLET BY MOUTH DAILY BEFORE BREAKFAST 90 tablet 0   Pediatric Multivitamins-Iron (FLINTSTONES COMPLETE PO) Take 1 tablet by mouth daily.     simvastatin (ZOCOR) 10 MG tablet TAKE 1 TABLET BY MOUTH EVERY NIGHT AT BEDTIME 30 tablet 2   Sodium Bicarbonate-Citric Acid (ALKA-SELTZER HEARTBURN) 1940-1000 MG TBEF Take 1-2 tablets by mouth 2 (two) times daily as needed (heartburn/indigestion).     amLODipine (NORVASC) 2.5 MG tablet Take 1 tablet (2.5 mg total) by mouth daily. 90 tablet 3   nitroGLYCERIN (NITROSTAT) 0.4 MG SL tablet Place 1 tablet (0.4 mg total) under the tongue every 5 (five) minutes x 3 doses as needed for chest pain. (Patient not taking: Reported on 11/30/2020) 25 tablet 3   No current facility-administered medications for this visit.    REVIEW OF SYSTEMS:   Review of Systems  Constitutional:  Negative for appetite change, fatigue, fever and unexpected weight change.  Cardiovascular:  Positive for chest pain and palpitations.  Gastrointestinal:  Negative for blood in  stool.   Neurological:  Positive for numbness (hands and feet).  All other systems reviewed and are negative.   PHYSICAL EXAMINATION: ECOG PERFORMANCE STATUS: 1 - Symptomatic but completely ambulatory  Vital signs reviewed and appear to be stable.  Physical exam deferred in lieu of counseling.   LABORATORY DATA:  I have reviewed the data as listed CBC Latest Ref Rng & Units 10/17/2020 06/18/2020 03/28/2020  WBC 4.0 - 10.5 K/uL 5.5 5.5 4.9  Hemoglobin 13.0 - 17.0 g/dL 11.9(L) 11.6(L) 12.1(L)  Hematocrit 39.0 - 52.0 % 35.6(L) 34.8(L) 36.4(L)  Platelets 150 - 400 K/uL 118(L) 138(L) 108(L)   CMP Latest Ref Rng & Units 06/18/2020 03/28/2020 03/12/2020  Glucose 65 - 99 mg/dL 189(H) 180(H) 128(H)  BUN 8 - 27 mg/dL 16 18 11   Creatinine 0.76 - 1.27 mg/dL 0.79 0.92 0.99  Sodium 134 - 144 mmol/L 141 137 140  Potassium 3.5 - 5.2 mmol/L 4.1 4.2 3.9  Chloride 96 - 106 mmol/L 105 100 104  CO2 20 - 29 mmol/L 23 29 27   Calcium 8.6 - 10.2 mg/dL 8.8 9.0 9.3  Total Protein 6.5 - 8.1 g/dL - - -  Total Bilirubin 0.3 - 1.2 mg/dL - - -  Alkaline Phos 38 - 126 U/L - - -  AST 15 - 41 U/L - - -  ALT 0 - 44 U/L - - -    RADIOGRAPHIC STUDIES: I have personally reviewed the radiological images as listed and agreed with the findings in the report. No results found.  ASSESSMENT:  1.  Low-grade follicular lymphoma: - Right inguinal lymph node excision biopsy on 09/15/2005 consistent with grade 2 follicular lymphoma, mixed cell type.  Cells are positive for Bcl-2, CD20 and CD79a.  CD10 is negative. - He was under watchful observation and did not require any treatment over the years. - Last CT CAP on 05/29/2017 at [facility] showed enlarged mediastinal nodes, largest 1.7 x 2.4 cm right infrahilar node.  Prominent but not enlarged abdominal retroperitoneal nodes.  Right middle lobe 7 mm nodule.  Fatty cirrhotic liver.  Normal spleen.  2.  Social/family history: - He works as a Water engineer at [employer].  He  quit smoking in 1995.  He smoked 1 pack/day for 20+ years. - Paternal grandmother had pancreatic cancer.  Maternal grandmother died of CVA in her 61s.  Father was on blood thinners.  3.  Normocytic anemia: - Last colonoscopy was in 2008 which showed normal colon with diverticulosis on the left side. - EGD on 03/21/2015 showed nodularity and ulcerations and atrophy seen in prepyloric antrum.  EUS showed thickened gastric wall in the prepyloric antrum with no mass.  PLAN:  1.  Low-grade follicular lymphoma: - No changes since last visit, labs without any major concerns.  At this time since he is clinically asymptomatic, it is appropriate to follow-up.  2.  Normocytic anemia: - He has mild normocytic anemia at Dr. Juel Burrow clinic on 06/29/2020 with hemoglobin 12.9. - No evidence of nutritional deficiency, hemolysis or MM  3.  Mild thrombocytopenia: - Differential includes immune mediated thrombocytopenia.  4.  Recurrent DVTs: - He reportedly had DVT x4 in his legs.  Denies any pulmonary embolism.  Last Doppler on 04/06/2018 was negative for DVT.  He has right leg lymphedema from lymphadenectomy.  Also has chronic venous stasis changes in both legs. Labs consistent with LA, no significant titres of ACL ab or Beta 2 glycoprotein ab I recommended that he consider going back on anticoagulation  with Coumadin, he would like to think about this.  I recommended that he talk to Dr. Nevada Crane for restarting Coumadin as soon as possible.  He understands that he is very high risk for recurrent DVTs, other cardiovascular events.  All questions were answered. The patient knows to call the clinic with any problems, questions or concerns.  Benay Pike MD

## 2020-12-03 ENCOUNTER — Other Ambulatory Visit (HOSPITAL_COMMUNITY): Payer: Self-pay

## 2020-12-03 DIAGNOSIS — C8590 Non-Hodgkin lymphoma, unspecified, unspecified site: Secondary | ICD-10-CM

## 2020-12-03 DIAGNOSIS — D649 Anemia, unspecified: Secondary | ICD-10-CM

## 2020-12-11 ENCOUNTER — Ambulatory Visit (INDEPENDENT_AMBULATORY_CARE_PROVIDER_SITE_OTHER): Payer: BC Managed Care – PPO

## 2020-12-11 DIAGNOSIS — I4891 Unspecified atrial fibrillation: Secondary | ICD-10-CM | POA: Diagnosis not present

## 2020-12-12 LAB — CUP PACEART REMOTE DEVICE CHECK
Battery Remaining Longevity: 92 mo
Battery Remaining Percentage: 92 %
Battery Voltage: 2.99 V
Brady Statistic AP VP Percent: 50 %
Brady Statistic AP VS Percent: 1 %
Brady Statistic AS VP Percent: 48 %
Brady Statistic AS VS Percent: 1 %
Brady Statistic RA Percent Paced: 49 %
Brady Statistic RV Percent Paced: 98 %
Date Time Interrogation Session: 20220726183750
Implantable Lead Implant Date: 20211025
Implantable Lead Implant Date: 20211025
Implantable Lead Location: 753859
Implantable Lead Location: 753860
Implantable Pulse Generator Implant Date: 20211025
Lead Channel Impedance Value: 300 Ohm
Lead Channel Impedance Value: 460 Ohm
Lead Channel Pacing Threshold Amplitude: 0.625 V
Lead Channel Pacing Threshold Amplitude: 1.5 V
Lead Channel Pacing Threshold Pulse Width: 0.4 ms
Lead Channel Pacing Threshold Pulse Width: 0.6 ms
Lead Channel Sensing Intrinsic Amplitude: 2.8 mV
Lead Channel Sensing Intrinsic Amplitude: 9.7 mV
Lead Channel Setting Pacing Amplitude: 0.875
Lead Channel Setting Pacing Amplitude: 2.5 V
Lead Channel Setting Pacing Pulse Width: 0.4 ms
Lead Channel Setting Sensing Sensitivity: 2.5 mV
Pulse Gen Model: 2272
Pulse Gen Serial Number: 3873891

## 2020-12-14 ENCOUNTER — Telehealth: Payer: Self-pay

## 2020-12-14 NOTE — Telephone Encounter (Signed)
Called patient to discuss, patient is at work and will return this evening. Spoke to patients wife on [employer], advised new finding of AF and Dr. Claudie Revering wishes to see about patient starting [place]. Advised I will send a referral to AF clinic and someone will call to set up apt. Appreciative of call.       Marland Kitchenjen

## 2020-12-16 ENCOUNTER — Other Ambulatory Visit: Payer: Self-pay | Admitting: General Practice

## 2020-12-18 ENCOUNTER — Ambulatory Visit (HOSPITAL_COMMUNITY)
Admission: RE | Admit: 2020-12-18 | Discharge: 2020-12-18 | Disposition: A | Discharge status: Home / Self Care | Payer: BC Managed Care – PPO | Source: Ambulatory Visit | Attending: Physician Assistant | Admitting: Physician Assistant

## 2020-12-18 ENCOUNTER — Other Ambulatory Visit: Payer: Self-pay

## 2020-12-18 ENCOUNTER — Encounter (HOSPITAL_COMMUNITY): Payer: Self-pay | Admitting: Physician Assistant

## 2020-12-18 VITALS — BP 138/78 | HR 123 | Ht 76.0 in | Wt 233.0 lb

## 2020-12-18 DIAGNOSIS — E785 Hyperlipidemia, unspecified: Secondary | ICD-10-CM | POA: Insufficient documentation

## 2020-12-18 DIAGNOSIS — I2510 Atherosclerotic heart disease of native coronary artery without angina pectoris: Secondary | ICD-10-CM | POA: Diagnosis not present

## 2020-12-18 DIAGNOSIS — D6862 Lupus anticoagulant syndrome: Secondary | ICD-10-CM | POA: Diagnosis not present

## 2020-12-18 DIAGNOSIS — D6869 Other thrombophilia: Secondary | ICD-10-CM | POA: Diagnosis not present

## 2020-12-18 DIAGNOSIS — Z8249 Family history of ischemic heart disease and other diseases of the circulatory system: Secondary | ICD-10-CM | POA: Diagnosis not present

## 2020-12-18 DIAGNOSIS — Z951 Presence of aortocoronary bypass graft: Secondary | ICD-10-CM | POA: Diagnosis not present

## 2020-12-18 DIAGNOSIS — Z09 Encounter for follow-up examination after completed treatment for conditions other than malignant neoplasm: Secondary | ICD-10-CM | POA: Diagnosis not present

## 2020-12-18 DIAGNOSIS — Z7901 Long term (current) use of anticoagulants: Secondary | ICD-10-CM | POA: Diagnosis not present

## 2020-12-18 DIAGNOSIS — Z79899 Other long term (current) drug therapy: Secondary | ICD-10-CM | POA: Diagnosis not present

## 2020-12-18 DIAGNOSIS — E118 Type 2 diabetes mellitus with unspecified complications: Secondary | ICD-10-CM | POA: Insufficient documentation

## 2020-12-18 DIAGNOSIS — Z794 Long term (current) use of insulin: Secondary | ICD-10-CM | POA: Diagnosis not present

## 2020-12-18 DIAGNOSIS — Z7982 Long term (current) use of aspirin: Secondary | ICD-10-CM | POA: Diagnosis not present

## 2020-12-18 DIAGNOSIS — D6859 Other primary thrombophilia: Secondary | ICD-10-CM | POA: Insufficient documentation

## 2020-12-18 DIAGNOSIS — I10 Essential (primary) hypertension: Secondary | ICD-10-CM | POA: Insufficient documentation

## 2020-12-18 DIAGNOSIS — I480 Paroxysmal atrial fibrillation: Secondary | ICD-10-CM | POA: Diagnosis not present

## 2020-12-18 DIAGNOSIS — Z87891 Personal history of nicotine dependence: Secondary | ICD-10-CM | POA: Diagnosis not present

## 2020-12-18 DIAGNOSIS — I453 Trifascicular block: Secondary | ICD-10-CM | POA: Diagnosis not present

## 2020-12-18 NOTE — Progress Notes (Signed)
Primary Care Physician: Celene Squibb, MD Primary Cardiologist: Dr Domenic Polite  Primary Electrophysiologist: Dr Quentin Ore Referring Physician: Dr Loura P[facility]   Kelly [name] is a 65 y.o. male with a history of CAD, cirrhosis, HTN, HLD, lupus, lupus anticoagulant, DM, trifascicular block s/p PPM, aortic stenosis, and atrial fibrillation who presents for follow up in the [facility].  The patient was initially diagnosed with atrial fibrillation 12/14/20 on his device with an alert for a 16 hour episode on 11/13/20. Patient was asymptomatic and at the time and was not aware of any specific triggers. Patient has a CHADS2VASC score of 3.   Today, he denies symptoms of palpitations, chest pain, shortness of breath, orthopnea, PND, lower extremity edema, dizziness, presyncope, syncope, snoring, daytime somnolence, bleeding, or neurologic sequela. The patient is tolerating medications without difficulties and is otherwise without complaint today.    Atrial Fibrillation Risk Factors:  he does not have symptoms or diagnosis of sleep apnea. he does not have a history of rheumatic fever.   he has a BMI of Body mass index is 28.36 kg/m.Marland Kitchen Filed Weights   12/18/20 1410  Weight: 105.7 kg    Family History  Problem Relation Age of Onset   Pancreatic cancer Maternal Grandmother    Obesity Mother    Hypertension Mother    Diabetes Father    Thyroid disease Father    Heart disease Father    Colon cancer Neg Hx      Atrial Fibrillation Management history:  Previous antiarrhythmic drugs: none Previous cardioversions: none Previous ablations: none CHADS2VASC score: 3 Anticoagulation history: warfarin   Past Medical History:  Diagnosis Date   Anxiety    Arthritis    Cirrhosis ([place])    Coronary atherosclerosis of native coronary artery    BMS LAD and PTCA RCA 2000,BMS RCA/PLA 2001, CABG June 2018   Diverticulitis    Essential hypertension    Fatty  liver    Gastric ulcer    GERD (gastroesophageal reflux disease)    Hyperlipidemia    Low-grade NHL (non-Hodgkin's lymphoma) 2007   Lupus anticoagulant positive    MI (myocardial infarction) ([place]) 2001   Pacemaker    Peripheral neuropathy    Thrombocytopenia ([facility])    Trifascicular block    Type 2 diabetes mellitus ([place])    Umbilical hernia    Past Surgical History:  Procedure Laterality Date   Axillary abscess     left incision and drainage left   BACK SURGERY     multiple   BIOPSY  08/15/2014   Procedure: BIOPSY;  Surgeon: Danie Binder, MD;  Location: [facility];  Service: Endoscopy;;  Gastric   BIOPSY  01/19/2015   Procedure: BIOPSY (GASTRIC ULCER);  Surgeon: Danie Binder, MD;  Location: [facility];  Service: Endoscopy;;   CARDIAC CATHETERIZATION     cardiac stents     x5 stents   COLONOSCOPY  June 2008   Dr. Dellis Filbert Medoff: Mild left colonic diverticulosis   CORONARY ARTERY BYPASS GRAFT N/A 11/11/2016   Procedure: CORONARY ARTERY BYPASS GRAFTING (CABG) x 3, USING LEFT MAMMARY ARTERY AND RIGHT GREATER SAPHENOUS VEIN HARVESTED ENDOSCOPICALLY;  Surgeon: Ivin Poot, MD;  Location: [facility];  Service: Open Heart Surgery;  Laterality: N/A;   Dental extractions     ESOPHAGOGASTRODUODENOSCOPY  June 2008   Dr. Dellis Filbert Medoff: Gastric ulcer, antral, biopsy with reactive changes associated glandular atrophy, no H pylori. No features of lymphoma. Likely NSAID related  ESOPHAGOGASTRODUODENOSCOPY  August 2008   Dr. Dellis Filbert Medoff: Complete healing of gastric ulcers.   ESOPHAGOGASTRODUODENOSCOPY (EGD) WITH PROPOFOL N/A 08/15/2014   SLF: 1. Abnormal pain due to ulcers & gastriitis    ESOPHAGOGASTRODUODENOSCOPY (EGD) WITH PROPOFOL N/A 01/19/2015   SLF: 1. Persistant ulcer with firm base 2. single gastric polyp found in the gastric antrum. Ulcer base    EUS N/A 03/21/2015   Thickened gastric wall in pre-pyloric antrum, no obvious intramural mass, fatty pancreas, no obvious pancreatic mass.     Inguinal lymph node biopsy     right inguinal lymph node biopsy   INTRAVASCULAR PRESSURE WIRE/FFR STUDY N/A 09/26/2016   Procedure: Intravascular Pressure Wire/FFR Study;  Surgeon: Nelva Bush, MD;  Location: [facility];  Service: Cardiovascular;  Laterality: N/A;   LEFT HEART CATH AND CORONARY ANGIOGRAPHY N/A 09/26/2016   Procedure: Left Heart Cath and Coronary Angiography;  Surgeon: Nelva Bush, MD;  Location: [facility];  Service: Cardiovascular;  Laterality: N/A;   LEFT HEART CATH AND CORS/GRAFTS ANGIOGRAPHY N/A 06/29/2020   Procedure: LEFT HEART CATH AND CORS/GRAFTS ANGIOGRAPHY;  Surgeon: Sherren Mocha, MD;  Location: [facility];  Service: Cardiovascular;  Laterality: N/A;   NECK SURGERY     PACEMAKER IMPLANT N/A 03/12/2020   Procedure: PACEMAKER IMPLANT;  Surgeon: Vickie Epley, MD;  Location: [facility];  Service: Cardiovascular;  Laterality: N/A;   POLYPECTOMY  01/19/2015   Procedure: POLYPECTOMY (GASTRIC);  Surgeon: Danie Binder, MD;  Location: [facility];  Service: Endoscopy;;   ROUX-EN-Y GASTRIC BYPASS     RNY gastric bypass   SHOULDER ARTHROSCOPY Left    TEE WITHOUT CARDIOVERSION N/A 11/11/2016   Procedure: TRANSESOPHAGEAL ECHOCARDIOGRAM (TEE);  Surgeon: Prescott Gum, Collier Salina, MD;  Location: [facility];  Service: Open Heart Surgery;  Laterality: N/A;    Current Outpatient Medications  Medication Sig Dispense Refill   acetaminophen (TYLENOL) 500 MG tablet Take 500-1,000 mg by mouth every 6 (six) hours as needed (for pain.).     amLODipine (NORVASC) 2.5 MG tablet TAKE 1 TABLET(2.5 MG) BY MOUTH DAILY 90 tablet 3   Ascorbic Acid (VITAMIN C) 500 MG CAPS Take 500 mg by mouth daily.     aspirin EC 81 MG tablet Take 1 tablet (81 mg total) by mouth daily. 30 tablet 11   B Complex-C (B-COMPLEX WITH VITAMIN C) tablet Take 1 tablet by mouth daily.     celecoxib (CELEBREX) 200 MG capsule Take 200 mg by mouth daily.     Cholecalciferol (VITAMIN D3) 50 MCG  (2000 UT) TABS Take 2,000 Units by mouth daily.     Continuous Blood Gluc Sensor (FREESTYLE LIBRE SENSOR SYSTEM) MISC Use one sensor every 10 days. 3 each 2   ferrous sulfate 325 (65 FE) MG tablet Take 325 mg by mouth daily.     FLUoxetine (PROZAC) 20 MG capsule Take 20 mg by mouth daily.     gabapentin (NEURONTIN) 600 MG tablet Take 600 mg by mouth at bedtime.     glucose blood (ONETOUCH VERIO) test strip USE AS DIRECTED FOUR- FIVE TIMES DAILY 450 each 1   Insulin Glargine (BASAGLAR KWIKPEN) 100 UNIT/ML SOPN INJECT 80 UNITS INTO SKIN DAILY AT 10 PM 30 mL 2   isosorbide mononitrate (IMDUR) 30 MG 24 hr tablet Take 1 tablet (30 mg total) by mouth daily. 90 tablet 3   Magnesium 250 MG TABS Take by mouth.     Multiple Vitamin (MULTIVITAMIN WITH MINERALS) TABS tablet  Take 1 tablet by mouth in the morning and at bedtime.     nitroGLYCERIN (NITROSTAT) 0.4 MG SL tablet Place 1 tablet (0.4 mg total) under the tongue every 5 (five) minutes x 3 doses as needed for chest pain. 25 tablet 3   ONETOUCH VERIO test strip USE TO TEST FOUR TIMES DAILY AS DIRECTED 450 each 2   oxyCODONE-acetaminophen (PERCOCET/ROXICET) 5-325 MG tablet Take 1 tablet by mouth every 4 (four) hours as needed for severe pain.     pantoprazole (PROTONIX) 40 MG tablet TAKE 1 TABLET BY MOUTH DAILY BEFORE BREAKFAST 90 tablet 0   Pediatric Multivitamins-Iron (FLINTSTONES COMPLETE PO) Take 1 tablet by mouth daily.     simvastatin (ZOCOR) 10 MG tablet TAKE 1 TABLET BY MOUTH EVERY NIGHT AT BEDTIME 30 tablet 2   Sodium Bicarbonate-Citric Acid (ALKA-SELTZER HEARTBURN) 1940-1000 MG TBEF Take 1-2 tablets by mouth 2 (two) times daily as needed (heartburn/indigestion).     No current facility-administered medications for this encounter.    No Known Allergies  Social History   Socioeconomic History   Marital status: Married    Spouse name: Not on file   Number of children: Not on file   Years of education: Not on file   Highest education  level: Not on file  Occupational History   Not on file  Tobacco Use   Smoking status: Former    Packs/day: 1.00    Years: 23.00    Pack years: 23.00    Types: Cigarettes    Start date: 10/30/1970    Quit date: 10/29/1993    Years since quitting: 27.1   Smokeless tobacco: Former    Types: Snuff, Chew  Vaping Use   Vaping Use: Never used  Substance and Sexual Activity   Alcohol use: No    Alcohol/week: 0.0 standard drinks   Drug use: No   Sexual activity: Never    Birth control/protection: None  Other Topics Concern   Not on file  Social History Narrative   Not on file   Social Determinants of Health   Financial Resource Strain: High Risk   Difficulty of Paying Living Expenses: Hard  Food Insecurity: No Food Insecurity   Worried About Running Out of Food in the Last Year: Never true   Ran Out of Food in the Last Year: Never true  Transportation Needs: No Transportation Needs   Lack of Transportation (Medical): No   Lack of Transportation (Non-Medical): No  Physical Activity: Sufficiently Active   Days of Exercise per Week: 7 days   Minutes of Exercise per Session: 60 min  Stress: Stress Concern Present   Feeling of Stress : Very much  Social Connections: Socially Integrated   Frequency of Communication with Friends and Family: More than three times a week   Frequency of Social Gatherings with Friends and Family: Never   Attends Religious Services: More than 4 times per year   Active Member of [employer] or Organizations: Yes   Attends Music therapist: More than 4 times per year   Marital Status: Married  Human resources officer Violence: Not At Risk   Fear of Current or Ex-Partner: No   Emotionally Abused: No   Physically Abused: No   Sexually Abused: No     ROS- All systems are reviewed and negative except as per the HPI above.  Physical Exam: Vitals:   12/18/20 1410  BP: 138/78  Pulse: (!) 123  Weight: 105.7 kg  Height: '6\' 4"'$  (1.93 m)  GEN- The  patient is a well appearing male, alert and oriented x 3 today.   Head- normocephalic, atraumatic Eyes-  Sclera clear, conjunctiva pink Ears- hearing intact Oropharynx- clear Neck- supple  Lungs- Clear to ausculation bilaterally, normal work of breathing Heart- Regular rate and rhythm, no rubs or gallops, 2/6 systolic murmur   GI- soft, NT, ND, + BS Extremities- no clubbing, cyanosis, or edema MS- no significant deformity or atrophy Skin- no rash or lesion Psych- euthymic mood, full affect Neuro- strength and sensation are intact  Wt Readings from Last 3 Encounters:  12/18/20 105.7 kg  10/17/20 103.1 kg  09/11/20 103 kg    EKG today demonstrates  AV dual paced rhythm Vent. rate 60 BPM PR interval * ms QRS duration 162 ms QT/QTcB 228/326 ms  Echo 02/21/20 demonstrated  1. Left ventricular ejection fraction, by estimation, is 50 to 55%. The  left ventricle has low normal function. The left ventricle has no regional  wall motion abnormalities. Left ventricular diastolic parameters are  indeterminate.   2. Right ventricular systolic function is normal. The right ventricular  size is normal.   3. Left atrial size was moderately dilated.   4. Right atrial size was mild to moderately dilated.   5. The mitral valve is normal in structure. Mild mitral valve  regurgitation. No evidence of mitral stenosis.   6. The aortic valve is tricuspid. There is moderate calcification of the  aortic valve. There is moderate thickening of the aortic valve. Aortic  valve regurgitation is not visualized. Mild to moderate aortic valve  stenosis. Aortic valve mean gradient  measures 15.6 mmHg. Aortic valve peak gradient measures 28.8 mmHg. Aortic  valve area, by VTI measures 1.14 cm.   7. The inferior vena cava is normal in size with greater than 50%  respiratory variability, suggesting right atrial pressure of 3 mmHg.   Epic records are reviewed at length today  CHA2DS2-VASc Score = 3  The  patient's score is based upon: CHF History: No HTN History: Yes Diabetes History: Yes Stroke History: No Vascular Disease History: Yes Age Score: 0 Gender Score: 0     ASSESSMENT AND PLAN: 1. Paroxysmal Atrial Fibrillation (ICD10:  I48.0) The patient's CHA2DS2-VASc score is 3, indicating a 3.2% annual risk of stroke.   General education about afib provided and questions answered. We also discussed his stroke risk and the risks and benefits of anticoagulation. With a diagnosis of lupus anticoagulant (antiphospholipid syndrome) warfarin would be the preferred [facility]. Patient agreeable to starting this medication. Referral sent to [facility] office to follow.   2. Secondary Hypercoagulable State (ICD10:  D68.69) The patient is at significant risk for stroke/thromboembolism based upon his CHA2DS2-VASc Score of 3.  Start Warfarin (Coumadin).   3. CAD S/p CABG No anginal symptoms.  4. HTN Stable, no changes today.  5. Trifascicular block S/p PPM, followed by Dr Quentin Ore and the device clinic.   Follow up in the AF clinic in 6 weeks.    [facility] [address] [place], [place] [address] [phone] 12/18/2020 2:25 PM

## 2020-12-20 ENCOUNTER — Ambulatory Visit (INDEPENDENT_AMBULATORY_CARE_PROVIDER_SITE_OTHER): Payer: BC Managed Care – PPO | Admitting: *Deleted

## 2020-12-20 ENCOUNTER — Other Ambulatory Visit: Payer: Self-pay

## 2020-12-20 DIAGNOSIS — Z5181 Encounter for therapeutic drug level monitoring: Secondary | ICD-10-CM

## 2020-12-20 DIAGNOSIS — R760 Raised antibody titer: Secondary | ICD-10-CM

## 2020-12-20 DIAGNOSIS — I480 Paroxysmal atrial fibrillation: Secondary | ICD-10-CM | POA: Diagnosis not present

## 2020-12-20 DIAGNOSIS — Z7901 Long term (current) use of anticoagulants: Secondary | ICD-10-CM

## 2020-12-20 DIAGNOSIS — D6869 Other thrombophilia: Secondary | ICD-10-CM | POA: Diagnosis not present

## 2020-12-20 DIAGNOSIS — I4891 Unspecified atrial fibrillation: Secondary | ICD-10-CM

## 2020-12-20 LAB — POCT INR: INR: 1.2 — AB (ref 2.0–3.0)

## 2020-12-20 MED ORDER — WARFARIN SODIUM 5 MG PO TABS: 5.0000 mg | ORAL_TABLET | Freq: Every day | ORAL | 3 refills | Status: DC

## 2020-12-20 NOTE — Patient Instructions (Signed)
Warfarin '5mg'$  tablet Take warfarin 1 tablet daily Recheck on 12/25/20 in office Warfarin pt teaching done.

## 2020-12-25 ENCOUNTER — Ambulatory Visit (INDEPENDENT_AMBULATORY_CARE_PROVIDER_SITE_OTHER): Payer: BC Managed Care – PPO | Admitting: *Deleted

## 2020-12-25 DIAGNOSIS — Z5181 Encounter for therapeutic drug level monitoring: Secondary | ICD-10-CM

## 2020-12-25 DIAGNOSIS — I480 Paroxysmal atrial fibrillation: Secondary | ICD-10-CM

## 2020-12-25 DIAGNOSIS — D6869 Other thrombophilia: Secondary | ICD-10-CM | POA: Diagnosis not present

## 2020-12-25 LAB — POCT INR: INR: 1.7 — AB (ref 2.0–3.0)

## 2020-12-25 NOTE — Patient Instructions (Signed)
Warfarin '5mg'$  tablet Take warfarin 1 1/2 tablets tonight then resume 1 tablet daily Recheck on 01/01/21 in office

## 2021-01-01 ENCOUNTER — Ambulatory Visit (INDEPENDENT_AMBULATORY_CARE_PROVIDER_SITE_OTHER): Payer: BC Managed Care – PPO | Admitting: *Deleted

## 2021-01-01 DIAGNOSIS — D6869 Other thrombophilia: Secondary | ICD-10-CM

## 2021-01-01 DIAGNOSIS — I480 Paroxysmal atrial fibrillation: Secondary | ICD-10-CM

## 2021-01-01 DIAGNOSIS — Z5181 Encounter for therapeutic drug level monitoring: Secondary | ICD-10-CM | POA: Diagnosis not present

## 2021-01-01 LAB — POCT INR: INR: 2.7 (ref 2.0–3.0)

## 2021-01-01 NOTE — Patient Instructions (Signed)
Warfarin '5mg'$  tablet Continue warfarin 1 tablet daily Recheck in office in 2 wks

## 2021-01-07 NOTE — Progress Notes (Signed)
Remote pacemaker transmission.   

## 2021-01-16 ENCOUNTER — Ambulatory Visit (INDEPENDENT_AMBULATORY_CARE_PROVIDER_SITE_OTHER): Payer: BC Managed Care – PPO | Admitting: *Deleted

## 2021-01-16 ENCOUNTER — Other Ambulatory Visit: Payer: Self-pay

## 2021-01-16 DIAGNOSIS — D6869 Other thrombophilia: Secondary | ICD-10-CM | POA: Diagnosis not present

## 2021-01-16 DIAGNOSIS — I480 Paroxysmal atrial fibrillation: Secondary | ICD-10-CM | POA: Diagnosis not present

## 2021-01-16 DIAGNOSIS — Z5181 Encounter for therapeutic drug level monitoring: Secondary | ICD-10-CM

## 2021-01-16 LAB — POCT INR: INR: 1.5 — AB (ref 2.0–3.0)

## 2021-01-16 NOTE — Patient Instructions (Signed)
Warfarin '5mg'$  tablet Increase warfarin to 1 tablet daily except 1 1/2 tablets on M,W,F Recheck in office in 1 wk

## 2021-01-23 ENCOUNTER — Ambulatory Visit (INDEPENDENT_AMBULATORY_CARE_PROVIDER_SITE_OTHER): Payer: BC Managed Care – PPO | Admitting: *Deleted

## 2021-01-23 DIAGNOSIS — F329 Major depressive disorder, single episode, unspecified: Secondary | ICD-10-CM | POA: Insufficient documentation

## 2021-01-23 DIAGNOSIS — I480 Paroxysmal atrial fibrillation: Secondary | ICD-10-CM | POA: Diagnosis not present

## 2021-01-23 DIAGNOSIS — D6869 Other thrombophilia: Secondary | ICD-10-CM

## 2021-01-23 DIAGNOSIS — Z5181 Encounter for therapeutic drug level monitoring: Secondary | ICD-10-CM | POA: Diagnosis not present

## 2021-01-23 LAB — POCT INR: INR: 1.4 — AB (ref 2.0–3.0)

## 2021-01-23 NOTE — Patient Instructions (Signed)
Warfarin '5mg'$  tablet Increase warfarin to 1 1/2 tablets daily  Recheck in office in 1 wk

## 2021-01-25 ENCOUNTER — Encounter: Payer: Self-pay | Admitting: Cardiology

## 2021-01-29 ENCOUNTER — Ambulatory Visit (HOSPITAL_COMMUNITY): Payer: BC Managed Care – PPO | Admitting: Physician Assistant

## 2021-01-29 ENCOUNTER — Other Ambulatory Visit: Payer: Self-pay

## 2021-01-29 ENCOUNTER — Ambulatory Visit (INDEPENDENT_AMBULATORY_CARE_PROVIDER_SITE_OTHER): Payer: BC Managed Care – PPO | Admitting: *Deleted

## 2021-01-29 DIAGNOSIS — D6869 Other thrombophilia: Secondary | ICD-10-CM | POA: Diagnosis not present

## 2021-01-29 DIAGNOSIS — Z5181 Encounter for therapeutic drug level monitoring: Secondary | ICD-10-CM | POA: Diagnosis not present

## 2021-01-29 DIAGNOSIS — I480 Paroxysmal atrial fibrillation: Secondary | ICD-10-CM

## 2021-01-29 LAB — POCT INR: INR: 3.7 — AB (ref 2.0–3.0)

## 2021-01-29 NOTE — Patient Instructions (Signed)
Warfarin '5mg'$  tablet Hold warfarin tonight then decrease dose to 1 1/2 tablets daily except 1 tablet on Mondays and Thursdays Recheck in office in 1 wk

## 2021-01-30 DIAGNOSIS — E611 Iron deficiency: Secondary | ICD-10-CM | POA: Insufficient documentation

## 2021-01-30 DIAGNOSIS — G894 Chronic pain syndrome: Secondary | ICD-10-CM | POA: Insufficient documentation

## 2021-01-30 DIAGNOSIS — R202 Paresthesia of skin: Secondary | ICD-10-CM | POA: Insufficient documentation

## 2021-02-06 ENCOUNTER — Ambulatory Visit (INDEPENDENT_AMBULATORY_CARE_PROVIDER_SITE_OTHER): Payer: BC Managed Care – PPO | Admitting: *Deleted

## 2021-02-06 DIAGNOSIS — D6869 Other thrombophilia: Secondary | ICD-10-CM

## 2021-02-06 DIAGNOSIS — Z5181 Encounter for therapeutic drug level monitoring: Secondary | ICD-10-CM | POA: Diagnosis not present

## 2021-02-06 DIAGNOSIS — I480 Paroxysmal atrial fibrillation: Secondary | ICD-10-CM

## 2021-02-06 LAB — POCT INR: INR: 3 (ref 2.0–3.0)

## 2021-02-06 NOTE — Patient Instructions (Signed)
Warfarin 5mg  tablet Continue warfarin 1 1/2 tablets daily except 1 tablet on Mondays and Thursdays Recheck in office in 2 wk

## 2021-02-07 ENCOUNTER — Ambulatory Visit (HOSPITAL_COMMUNITY): Payer: BC Managed Care – PPO | Admitting: Physician Assistant

## 2021-02-20 ENCOUNTER — Ambulatory Visit (INDEPENDENT_AMBULATORY_CARE_PROVIDER_SITE_OTHER): Payer: BC Managed Care – PPO | Admitting: *Deleted

## 2021-02-20 DIAGNOSIS — D6869 Other thrombophilia: Secondary | ICD-10-CM

## 2021-02-20 DIAGNOSIS — I480 Paroxysmal atrial fibrillation: Secondary | ICD-10-CM | POA: Diagnosis not present

## 2021-02-20 DIAGNOSIS — Z5181 Encounter for therapeutic drug level monitoring: Secondary | ICD-10-CM

## 2021-02-20 LAB — POCT INR: INR: 2.5 (ref 2.0–3.0)

## 2021-02-20 NOTE — Patient Instructions (Signed)
Warfarin 5mg  tablet Continue warfarin 1 1/2 tablets daily except 1 tablet on Mondays and Thursdays Recheck in office in 3 wk

## 2021-03-12 ENCOUNTER — Ambulatory Visit (INDEPENDENT_AMBULATORY_CARE_PROVIDER_SITE_OTHER): Payer: BC Managed Care – PPO

## 2021-03-12 DIAGNOSIS — I480 Paroxysmal atrial fibrillation: Secondary | ICD-10-CM

## 2021-03-13 LAB — CUP PACEART REMOTE DEVICE CHECK
Battery Remaining Longevity: 89 mo
Battery Remaining Percentage: 89 %
Battery Voltage: 3.01 V
Brady Statistic AP VP Percent: 50 %
Brady Statistic AP VS Percent: 1 %
Brady Statistic AS VP Percent: 48 %
Brady Statistic AS VS Percent: 1 %
Brady Statistic RA Percent Paced: 48 %
Brady Statistic RV Percent Paced: 98 %
Date Time Interrogation Session: 20221025192326
Implantable Lead Implant Date: 20211025
Implantable Lead Implant Date: 20211025
Implantable Lead Location: 753859
Implantable Lead Location: 753860
Implantable Pulse Generator Implant Date: 20211025
Lead Channel Impedance Value: 310 Ohm
Lead Channel Impedance Value: 460 Ohm
Lead Channel Pacing Threshold Amplitude: 0.875 V
Lead Channel Pacing Threshold Amplitude: 1.5 V
Lead Channel Pacing Threshold Pulse Width: 0.4 ms
Lead Channel Pacing Threshold Pulse Width: 0.6 ms
Lead Channel Sensing Intrinsic Amplitude: 3 mV
Lead Channel Sensing Intrinsic Amplitude: 6.9 mV
Lead Channel Setting Pacing Amplitude: 1.125
Lead Channel Setting Pacing Amplitude: 2.5 V
Lead Channel Setting Pacing Pulse Width: 0.4 ms
Lead Channel Setting Sensing Sensitivity: 2.5 mV
Pulse Gen Model: 2272
Pulse Gen Serial Number: 3873891

## 2021-03-14 ENCOUNTER — Other Ambulatory Visit: Payer: Self-pay

## 2021-03-14 ENCOUNTER — Ambulatory Visit (INDEPENDENT_AMBULATORY_CARE_PROVIDER_SITE_OTHER): Payer: BC Managed Care – PPO | Admitting: *Deleted

## 2021-03-14 DIAGNOSIS — D6869 Other thrombophilia: Secondary | ICD-10-CM

## 2021-03-14 DIAGNOSIS — I480 Paroxysmal atrial fibrillation: Secondary | ICD-10-CM | POA: Diagnosis not present

## 2021-03-14 DIAGNOSIS — Z5181 Encounter for therapeutic drug level monitoring: Secondary | ICD-10-CM

## 2021-03-14 LAB — POCT INR: INR: 3.7 — AB (ref 2.0–3.0)

## 2021-03-14 NOTE — Patient Instructions (Signed)
Description   -Warfarin 5mg  tablet -Hold warfarin today -Continue warfarin 1 1/2 tablets daily except 1 tablet on Mondays and Thursdays -Recheck in office in 2 wks

## 2021-03-21 NOTE — Progress Notes (Signed)
Remote pacemaker transmission.   

## 2021-03-28 ENCOUNTER — Ambulatory Visit (INDEPENDENT_AMBULATORY_CARE_PROVIDER_SITE_OTHER): Payer: BC Managed Care – PPO | Admitting: *Deleted

## 2021-03-28 ENCOUNTER — Other Ambulatory Visit: Payer: Self-pay

## 2021-03-28 DIAGNOSIS — I480 Paroxysmal atrial fibrillation: Secondary | ICD-10-CM | POA: Diagnosis not present

## 2021-03-28 DIAGNOSIS — Z5181 Encounter for therapeutic drug level monitoring: Secondary | ICD-10-CM

## 2021-03-28 DIAGNOSIS — D6869 Other thrombophilia: Secondary | ICD-10-CM | POA: Diagnosis not present

## 2021-03-28 LAB — POCT INR: INR: 2 (ref 2.0–3.0)

## 2021-03-28 NOTE — Patient Instructions (Signed)
-  Warfarin 5mg tablet Continue warfarin 1 1/2 tablets daily except 1 tablet on Mondays and Thursdays -Recheck in office in 4 wks 

## 2021-04-10 ENCOUNTER — Other Ambulatory Visit: Payer: Self-pay | Admitting: *Deleted

## 2021-04-10 MED ORDER — NITROGLYCERIN 0.4 MG SL SUBL: 0.4000 mg | SUBLINGUAL_TABLET | SUBLINGUAL | 3 refills | Status: DC | PRN

## 2021-04-25 ENCOUNTER — Other Ambulatory Visit: Payer: Self-pay

## 2021-04-25 ENCOUNTER — Ambulatory Visit (INDEPENDENT_AMBULATORY_CARE_PROVIDER_SITE_OTHER): Payer: BC Managed Care – PPO | Admitting: *Deleted

## 2021-04-25 DIAGNOSIS — Z5181 Encounter for therapeutic drug level monitoring: Secondary | ICD-10-CM | POA: Diagnosis not present

## 2021-04-25 DIAGNOSIS — D6869 Other thrombophilia: Secondary | ICD-10-CM | POA: Diagnosis not present

## 2021-04-25 DIAGNOSIS — I480 Paroxysmal atrial fibrillation: Secondary | ICD-10-CM | POA: Diagnosis not present

## 2021-04-25 LAB — POCT INR: INR: 2.7 (ref 2.0–3.0)

## 2021-04-25 NOTE — Patient Instructions (Signed)
-  Warfarin 5mg tablet Continue warfarin 1 1/2 tablets daily except 1 tablet on Mondays and Thursdays -Recheck in office in 4 wks 

## 2021-05-03 ENCOUNTER — Telehealth: Payer: Self-pay | Admitting: Cardiology

## 2021-05-03 ENCOUNTER — Encounter: Payer: Self-pay | Admitting: Cardiology

## 2021-05-03 NOTE — Telephone Encounter (Signed)
Pt c/o of Chest Pain: STAT if CP now or developed within 24 hours  1. Are you having CP right now? Doesn't know isn't with him he is at work   2. Are you experiencing any other symptoms (ex. SOB, nausea, vomiting, sweating)? No   3. How long have you been experiencing CP? 5-6 months   4. Is your CP continuous or coming and going? Coming and going  5. Have you taken Nitroglycerin? Yes, every time it occurs. Last taken 3 days ago that she knows of   Requesting callback to her due to stating he won't answer.

## 2021-05-03 NOTE — Telephone Encounter (Signed)
Error

## 2021-05-03 NOTE — Telephone Encounter (Signed)
I spoke with wife, patient had just walked in door from work. He has had intermittent chest pain for the past 6 months. He happened to see pcp this week and mentioned this and they told him to call his cardiologist. His last episode of CP was three days ago. He has used up to three NTG in past.History of A-fib and CABG. Appointment was made for next week, 12/22 with Dr.McDowell in the [place] office. Patient was last seen 05/04/20 by provider although he does see Coumadin nurse frequently.    I advised patient and wife to go direct to [facility] ED if he has to use more than three NTG for CP or if symptoms worsen. They agree with plan.

## 2021-05-05 ENCOUNTER — Other Ambulatory Visit: Payer: Self-pay | Admitting: Cardiology

## 2021-05-09 ENCOUNTER — Ambulatory Visit: Payer: BC Managed Care – PPO | Admitting: Cardiology

## 2021-05-09 ENCOUNTER — Encounter: Payer: Self-pay | Admitting: *Deleted

## 2021-05-09 ENCOUNTER — Encounter: Payer: Self-pay | Admitting: Cardiology

## 2021-05-09 VITALS — BP 142/78 | HR 60 | Ht 76.0 in | Wt 232.8 lb

## 2021-05-09 DIAGNOSIS — I350 Nonrheumatic aortic (valve) stenosis: Secondary | ICD-10-CM | POA: Diagnosis not present

## 2021-05-09 DIAGNOSIS — E782 Mixed hyperlipidemia: Secondary | ICD-10-CM

## 2021-05-09 DIAGNOSIS — I25119 Atherosclerotic heart disease of native coronary artery with unspecified angina pectoris: Secondary | ICD-10-CM

## 2021-05-09 DIAGNOSIS — I453 Trifascicular block: Secondary | ICD-10-CM | POA: Diagnosis not present

## 2021-05-09 MED ORDER — ISOSORBIDE MONONITRATE ER 30 MG PO TB24: 30.0000 mg | ORAL_TABLET | Freq: Every day | ORAL | 3 refills | Status: DC

## 2021-05-09 NOTE — Addendum Note (Signed)
Addended by: Laurine Blazer on: 05/09/2021 03:41 PM   Modules accepted: Orders

## 2021-05-09 NOTE — Progress Notes (Signed)
He tells   Cardiology Office Note  Date: 05/09/2021   [name], DOB 06-24-1955, MRN [id number]  PCP:  Celene Squibb, MD  Cardiologist:  Rozann Lesches, MD Electrophysiologist:  Vickie Epley, MD   Chief Complaint  Patient presents with   Cardiac follow-up    History of Present Illness: Kelly [name] is a 65 y.o. male presenting for a routine follow-up visit, I have not seen him since 2019.  Interval chart reviewed, his most recent visit was in the atrial fibrillation clinic in August.  He is here today with his wife for a follow-up visit.  He has been having more regular angina symptoms and having to use sublingual nitroglycerin over the last few months.  In reviewing his medications, it looks like he is no longer on Imdur which had been prescribed after his last cardiac catheterization.  He has a [employer]. 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 Quentin Ore.  Last device interrogation revealed normal function.  CHA2DS2-VASc score is 3.  He is on Coumadin with follow-up in the anticoagulation clinic, recent INR 2.7.  He does not report any spontaneous bleeding problems.  Cardiac catheterization performed in February of this year revealed patent bypass grafts with distal CAD best managed medically.  I personally reviewed his ECG today which shows a ventricular paced rhythm with PVC.  Past Medical History:  Diagnosis Date   Anxiety    Arthritis    Cirrhosis ([place])    Coronary atherosclerosis of native coronary artery    BMS LAD and PTCA RCA 2000,BMS RCA/PLA 2001, CABG June 2018   Diverticulitis    Essential hypertension    Fatty liver    Gastric ulcer    GERD (gastroesophageal reflux disease)    Hyperlipidemia    Low-grade NHL (non-Hodgkin's lymphoma) 2007   Lupus anticoagulant positive    MI (myocardial infarction) ([place]) 2001   Pacemaker    Peripheral neuropathy    Thrombocytopenia ([facility])     Trifascicular block    Type 2 diabetes mellitus ([place])    Umbilical hernia     Past Surgical History:  Procedure Laterality Date   Axillary abscess     left incision and drainage left   BACK SURGERY     multiple   BIOPSY  08/15/2014   Procedure: BIOPSY;  Surgeon: Danie Binder, MD;  Location: [facility];  Service: Endoscopy;;  Gastric   BIOPSY  01/19/2015   Procedure: BIOPSY (GASTRIC ULCER);  Surgeon: Danie Binder, MD;  Location: [facility];  Service: Endoscopy;;   CARDIAC CATHETERIZATION     cardiac stents     x5 stents   COLONOSCOPY  June 2008   Dr. Dellis Filbert Medoff: Mild left colonic diverticulosis   CORONARY ARTERY BYPASS GRAFT N/A 11/11/2016   Procedure: CORONARY ARTERY BYPASS GRAFTING (CABG) x 3, USING LEFT MAMMARY ARTERY AND RIGHT GREATER SAPHENOUS VEIN HARVESTED ENDOSCOPICALLY;  Surgeon: Ivin Poot, MD;  Location: [facility];  Service: Open Heart Surgery;  Laterality: N/A;   Dental extractions     ESOPHAGOGASTRODUODENOSCOPY  June 2008   Dr. Dellis Filbert Medoff: Gastric ulcer, antral, biopsy with reactive changes associated glandular atrophy, no H pylori. No features of lymphoma. Likely NSAID related   ESOPHAGOGASTRODUODENOSCOPY  August 2008   Dr. Dellis Filbert Medoff: Complete healing of gastric ulcers.   ESOPHAGOGASTRODUODENOSCOPY (EGD) WITH PROPOFOL N/A 08/15/2014   SLF: 1. Abnormal pain due to ulcers & gastriitis    ESOPHAGOGASTRODUODENOSCOPY (  EGD) WITH PROPOFOL N/A 01/19/2015   SLF: 1. Persistant ulcer with firm base 2. single gastric polyp found in the gastric antrum. Ulcer base    EUS N/A 03/21/2015   Thickened gastric wall in pre-pyloric antrum, no obvious intramural mass, fatty pancreas, no obvious pancreatic mass.    Inguinal lymph node biopsy     right inguinal lymph node biopsy   INTRAVASCULAR PRESSURE WIRE/FFR STUDY N/A 09/26/2016   Procedure: Intravascular Pressure Wire/FFR Study;  Surgeon: Nelva Bush, MD;  Location: [facility];  Service: Cardiovascular;  Laterality: N/A;    LEFT HEART CATH AND CORONARY ANGIOGRAPHY N/A 09/26/2016   Procedure: Left Heart Cath and Coronary Angiography;  Surgeon: Nelva Bush, MD;  Location: [facility];  Service: Cardiovascular;  Laterality: N/A;   LEFT HEART CATH AND CORS/GRAFTS ANGIOGRAPHY N/A 06/29/2020   Procedure: LEFT HEART CATH AND CORS/GRAFTS ANGIOGRAPHY;  Surgeon: Sherren Mocha, MD;  Location: [facility];  Service: Cardiovascular;  Laterality: N/A;   NECK SURGERY     PACEMAKER IMPLANT N/A 03/12/2020   Procedure: PACEMAKER IMPLANT;  Surgeon: Vickie Epley, MD;  Location: [facility];  Service: Cardiovascular;  Laterality: N/A;   POLYPECTOMY  01/19/2015   Procedure: POLYPECTOMY (GASTRIC);  Surgeon: Danie Binder, MD;  Location: [facility];  Service: Endoscopy;;   ROUX-EN-Y GASTRIC BYPASS     RNY gastric bypass   SHOULDER ARTHROSCOPY Left    TEE WITHOUT CARDIOVERSION N/A 11/11/2016   Procedure: TRANSESOPHAGEAL ECHOCARDIOGRAM (TEE);  Surgeon: Prescott Gum, Collier Salina, MD;  Location: [facility];  Service: Open Heart Surgery;  Laterality: N/A;    Current Outpatient Medications  Medication Sig Dispense Refill   acetaminophen (TYLENOL) 500 MG tablet Take 500-1,000 mg by mouth every 6 (six) hours as needed (for pain.).     amLODipine (NORVASC) 2.5 MG tablet TAKE 1 TABLET(2.5 MG) BY MOUTH DAILY 90 tablet 3   Ascorbic Acid (VITAMIN C) 500 MG CAPS Take 500 mg by mouth daily.     aspirin EC 81 MG tablet Take 1 tablet (81 mg total) by mouth daily. 30 tablet 11   B Complex-C (B-COMPLEX WITH VITAMIN C) tablet Take 1 tablet by mouth daily.     Cholecalciferol (VITAMIN D3) 50 MCG (2000 UT) TABS Take 2,000 Units by mouth daily.     Continuous Blood Gluc Sensor (FREESTYLE LIBRE SENSOR SYSTEM) MISC Use one sensor every 10 days. 3 each 2   ferrous sulfate 325 (65 FE) MG tablet Take 325 mg by mouth daily.     FLUoxetine (PROZAC) 20 MG capsule Take 20 mg by mouth daily.     gabapentin (NEURONTIN) 600 MG tablet Take 600 mg by  mouth at bedtime.     glucose blood (ONETOUCH VERIO) test strip USE AS DIRECTED FOUR- FIVE TIMES DAILY 450 each 1   Insulin Glargine (BASAGLAR KWIKPEN) 100 UNIT/ML SOPN INJECT 80 UNITS INTO SKIN DAILY AT 10 PM 30 mL 2   Magnesium 250 MG TABS Take by mouth.     Multiple Vitamin (MULTIVITAMIN WITH MINERALS) TABS tablet Take 1 tablet by mouth in the morning and at bedtime.     nitroGLYCERIN (NITROSTAT) 0.4 MG SL tablet Place 1 tablet (0.4 mg total) under the tongue every 5 (five) minutes x 3 doses as needed for chest pain. 25 tablet 3   ONETOUCH VERIO test strip USE TO TEST FOUR TIMES DAILY AS DIRECTED 450 each 2   oxyCODONE-acetaminophen (PERCOCET/ROXICET) 5-325 MG tablet Take 1 tablet by mouth  every 4 (four) hours as needed for severe pain.     pantoprazole (PROTONIX) 40 MG tablet TAKE 1 TABLET BY MOUTH DAILY BEFORE BREAKFAST 90 tablet 0   Pediatric Multivitamins-Iron (FLINTSTONES COMPLETE PO) Take 1 tablet by mouth daily.     simvastatin (ZOCOR) 10 MG tablet TAKE 1 TABLET BY MOUTH EVERY NIGHT AT BEDTIME 30 tablet 2   Sodium Bicarbonate-Citric Acid (ALKA-SELTZER HEARTBURN) 1940-1000 MG TBEF Take 1-2 tablets by mouth 2 (two) times daily as needed (heartburn/indigestion).     warfarin (COUMADIN) 5 MG tablet Take 1 1/2 tablets daily except 1 tablet on Mondays and Thursdays or as directed 45 tablet 3   isosorbide mononitrate (IMDUR) 30 MG 24 hr tablet Take 1 tablet (30 mg total) by mouth daily. 90 tablet 3   No current facility-administered medications for this visit.   Allergies:  Patient has no known allergies.   ROS: No palpitations or syncope.  Physical Exam: VS:  BP (!) 142/78    Pulse 60    Ht 6\' 4"  (1.93 m)    Wt 232 lb 12.8 oz (105.6 kg)    SpO2 97%    BMI 28.34 kg/m , BMI Body mass index is 28.34 kg/m.  Wt Readings from Last 3 Encounters:  05/09/21 232 lb 12.8 oz (105.6 kg)  12/18/20 233 lb (105.7 kg)  10/17/20 227 lb 6.4 oz (103.1 kg)    General: Patient appears comfortable at  rest. HEENT: Conjunctiva and lids normal, wearing a mask. Neck: Supple, no elevated JVP or carotid bruits, no thyromegaly. Lungs: Clear to auscultation, nonlabored breathing at rest. Cardiac: Regular rate and rhythm, no S3, 2/6 systolic murmur, no pericardial rub. Extremities: No pitting edema.  ECG:  An ECG dated 12/18/2020 was personally reviewed today and demonstrated:  Dual chamber pacing.  Recent Labwork: 06/18/2020: BUN 16; Creatinine, Ser 0.79; Potassium 4.1; Sodium 141 10/17/2020: Hemoglobin 11.9; Platelets 118     Component Value Date/Time   CHOL 107 03/02/2017 1615   TRIG 164 (H) 03/02/2017 1615   HDL 39 (L) 03/02/2017 1615   CHOLHDL 2.7 03/02/2017 1615   VLDL 56 (H) 01/28/2016 1602   LDLCALC 44 03/02/2017 1615    Other Studies Reviewed Today:  Echocardiogram 02/21/2020:  1. Left ventricular ejection fraction, by estimation, is 50 to 55%. The  left ventricle has low normal function. The left ventricle has no regional  wall motion abnormalities. Left ventricular diastolic parameters are  indeterminate.   2. Right ventricular systolic function is normal. The right ventricular  size is normal.   3. Left atrial size was moderately dilated.   4. Right atrial size was mild to moderately dilated.   5. The mitral valve is normal in structure. Mild mitral valve  regurgitation. No evidence of mitral stenosis.   6. The aortic valve is tricuspid. There is moderate calcification of the  aortic valve. There is moderate thickening of the aortic valve. Aortic  valve regurgitation is not visualized. Mild to moderate aortic valve  stenosis. Aortic valve mean gradient  measures 15.6 mmHg. Aortic valve peak gradient measures 28.8 mmHg. Aortic  valve area, by VTI measures 1.14 cm.   7. The inferior vena cava is normal in size with greater than 50%  respiratory variability, suggesting right atrial pressure of 3 mmHg.   Cardiac catheterization 06/29/2020: 1.  Severe native three-vessel  coronary artery disease with severe stenosis of the proximal LAD and associated heavy calcification, chronic total occlusion of the ostial RCA, and moderately severe left circumflex  stenosis 2.  Status post aortocoronary bypass surgery with continued patency of the LIMA to LAD graft, saphenous vein graft to second obtuse marginal, and saphenous vein graft to right PDA. 3.  Normal LVEDP   Recommendations: The patient has patency of all bypass conduits now 4 years out from coronary bypass surgery.  He has distal vessel disease, not amenable to PCI.  Recommend ongoing medical therapy/increase antianginal program.  Will add low-dose isosorbide as tolerated.  Assessment and Plan:  1.  Multivessel CAD status post CABG with patent bypass grafts documented in February of this year.  He does have distal native CAD not amenable to PCI with plan for medical therapy.  He reports angina symptoms, although has not been on Imdur for the last few months.  Plan to resume Imdur 30 mg daily, otherwise continue Norvasc, aspirin, and Zocor.  2.  Trifascicular block status post St. Jude dual-chamber pacemaker with follow-up by Dr. Quentin Ore.  3.  Paroxysmal atrial fibrillation documented by device interrogation, no regular sense of palpitations.  He is on Coumadin (also lupus anticoagulant positive) with follow-up in the anticoagulation clinic.  4.  Mild to moderate calcific aortic stenosis by echocardiogram in October 2021.  Continue surveillance over time.  5.  Next hyperlipidemia on Zocor.  Requesting lab work from PCP for review.  Medication Adjustments/Labs and Tests Ordered: Current medicines are reviewed at length with the patient today.  Concerns regarding medicines are outlined above.   Tests Ordered: No orders of the defined types were placed in this encounter.   Medication Changes: Meds ordered this encounter  Medications   isosorbide mononitrate (IMDUR) 30 MG 24 hr tablet    Sig: Take 1 tablet (30  mg total) by mouth daily.    Dispense:  90 tablet    Refill:  3    Disposition:  Follow up  6 months.  Signed, Satira Sark, MD, [facility] 05/09/2021 3:03 PM    [facility] [place], [place], [place] [address] Phone: [phone]; Fax: [phone]

## 2021-05-09 NOTE — Patient Instructions (Addendum)
Medication Instructions:  Your physician has recommended you make the following change in your medication:  Restart isosorbide mononitrate 30 mg daily Continue other medications the same  Labwork: none  Testing/Procedures: none  Follow-Up: Your physician recommends that you schedule a follow-up appointment in: 6 months  Any Other Special Instructions Will Be Listed Below (If Applicable).  If you need a refill on your cardiac medications before your next appointment, please call your pharmacy.

## 2021-05-23 ENCOUNTER — Ambulatory Visit (INDEPENDENT_AMBULATORY_CARE_PROVIDER_SITE_OTHER): Payer: BC Managed Care – PPO | Admitting: *Deleted

## 2021-05-23 DIAGNOSIS — D6869 Other thrombophilia: Secondary | ICD-10-CM

## 2021-05-23 DIAGNOSIS — Z5181 Encounter for therapeutic drug level monitoring: Secondary | ICD-10-CM

## 2021-05-23 DIAGNOSIS — I480 Paroxysmal atrial fibrillation: Secondary | ICD-10-CM | POA: Diagnosis not present

## 2021-05-23 LAB — POCT INR: INR: 2.5 (ref 2.0–3.0)

## 2021-05-23 NOTE — Patient Instructions (Signed)
-  Warfarin 5mg tablet Continue warfarin 1 1/2 tablets daily except 1 tablet on Mondays and Thursdays -Recheck in office in 6 wks 

## 2021-06-03 ENCOUNTER — Inpatient Hospital Stay (HOSPITAL_COMMUNITY): Payer: BC Managed Care – PPO | Attending: Hematology

## 2021-06-10 ENCOUNTER — Ambulatory Visit (HOSPITAL_COMMUNITY): Payer: BC Managed Care – PPO | Admitting: Hematology

## 2021-06-11 ENCOUNTER — Ambulatory Visit (INDEPENDENT_AMBULATORY_CARE_PROVIDER_SITE_OTHER): Payer: BC Managed Care – PPO

## 2021-06-11 DIAGNOSIS — I480 Paroxysmal atrial fibrillation: Secondary | ICD-10-CM

## 2021-06-12 ENCOUNTER — Telehealth: Payer: Self-pay | Admitting: Cardiology

## 2021-06-12 LAB — CUP PACEART REMOTE DEVICE CHECK
Battery Remaining Longevity: 85 mo
Battery Remaining Percentage: 86 %
Battery Voltage: 3.01 V
Brady Statistic AP VP Percent: 48 %
Brady Statistic AP VS Percent: 1 %
Brady Statistic AS VP Percent: 49 %
Brady Statistic AS VS Percent: 1 %
Brady Statistic RA Percent Paced: 47 %
Brady Statistic RV Percent Paced: 98 %
Date Time Interrogation Session: 20230124190217
Implantable Lead Implant Date: 20211025
Implantable Lead Implant Date: 20211025
Implantable Lead Location: 753859
Implantable Lead Location: 753860
Implantable Pulse Generator Implant Date: 20211025
Lead Channel Impedance Value: 300 Ohm
Lead Channel Impedance Value: 460 Ohm
Lead Channel Pacing Threshold Amplitude: 0.75 V
Lead Channel Pacing Threshold Amplitude: 1.5 V
Lead Channel Pacing Threshold Pulse Width: 0.4 ms
Lead Channel Pacing Threshold Pulse Width: 0.6 ms
Lead Channel Sensing Intrinsic Amplitude: 2.5 mV
Lead Channel Sensing Intrinsic Amplitude: 6.7 mV
Lead Channel Setting Pacing Amplitude: 1 V
Lead Channel Setting Pacing Amplitude: 2.5 V
Lead Channel Setting Pacing Pulse Width: 0.4 ms
Lead Channel Setting Sensing Sensitivity: 2.5 mV
Pulse Gen Model: 2272
Pulse Gen Serial Number: 3873891

## 2021-06-12 NOTE — Telephone Encounter (Signed)
° °  Patient Name: Kelly [name]  DOB: 06-12-1955 MRN: [id number]  Primary Cardiologist: Rozann Lesches, MD  Chart reviewed as part of pre-operative protocol coverage. Cataract extractions are recognized in guidelines as low risk surgeries that do not typically require specific preoperative testing or holding of blood thinner therapy. Therefore, given past medical history and time since last visit, based on ACC/AHA guidelines, Kelly [name] would be at acceptable risk for the planned procedure without further cardiovascular testing.   I will route this recommendation to the requesting party via Epic fax function and remove from pre-op pool.  Please call with questions.  Ledora Bottcher, PA 06/12/2021, 5:39 PM

## 2021-06-12 NOTE — Telephone Encounter (Signed)
° °  Pre-operative Risk Assessment    Patient Name: NEEDHAM BIGGINS  DOB: August 26, 1955 MRN: [id number]      Request for Surgical Clearance    Procedure:   CATARACT EXTRACTION BY LE, IOL LEFT THEN RIGHT.   Date of Surgery:  Clearance 06/20/21                                 Surgeon:  [facility]  Surgeon's Group or Practice Name:  [facility] Phone number:  [phone] EXT [phone] Fax number:  [phone]   Type of Clearance Requested:   - Medical    Type of Anesthesia:   IV SEDATION    Additional requests/questions:   Patient is scheduled for cataract Extraction by PE-please advise cardiac risk stratification for cataract surgery   Signed, Chanda Busing   06/12/2021, 2:55 PM

## 2021-06-21 NOTE — Progress Notes (Signed)
Remote pacemaker transmission.   

## 2021-07-03 ENCOUNTER — Inpatient Hospital Stay (HOSPITAL_COMMUNITY): Payer: BC Managed Care – PPO | Attending: Hematology

## 2021-07-03 DIAGNOSIS — I4891 Unspecified atrial fibrillation: Secondary | ICD-10-CM | POA: Insufficient documentation

## 2021-07-03 DIAGNOSIS — Z950 Presence of cardiac pacemaker: Secondary | ICD-10-CM | POA: Insufficient documentation

## 2021-07-03 DIAGNOSIS — Z800 Family history of malignant neoplasm of digestive organs: Secondary | ICD-10-CM | POA: Insufficient documentation

## 2021-07-03 DIAGNOSIS — I252 Old myocardial infarction: Secondary | ICD-10-CM | POA: Diagnosis not present

## 2021-07-03 DIAGNOSIS — D6862 Lupus anticoagulant syndrome: Secondary | ICD-10-CM | POA: Insufficient documentation

## 2021-07-03 DIAGNOSIS — D696 Thrombocytopenia, unspecified: Secondary | ICD-10-CM | POA: Insufficient documentation

## 2021-07-03 DIAGNOSIS — Z7901 Long term (current) use of anticoagulants: Secondary | ICD-10-CM | POA: Insufficient documentation

## 2021-07-03 DIAGNOSIS — Z8572 Personal history of non-Hodgkin lymphomas: Secondary | ICD-10-CM | POA: Diagnosis present

## 2021-07-03 DIAGNOSIS — Z86718 Personal history of other venous thrombosis and embolism: Secondary | ICD-10-CM | POA: Insufficient documentation

## 2021-07-03 DIAGNOSIS — C8590 Non-Hodgkin lymphoma, unspecified, unspecified site: Secondary | ICD-10-CM

## 2021-07-03 DIAGNOSIS — Z87891 Personal history of nicotine dependence: Secondary | ICD-10-CM | POA: Diagnosis not present

## 2021-07-03 DIAGNOSIS — D649 Anemia, unspecified: Secondary | ICD-10-CM | POA: Diagnosis not present

## 2021-07-03 LAB — LACTATE DEHYDROGENASE: LDH: 176 U/L (ref 98–192)

## 2021-07-03 LAB — CBC WITH DIFFERENTIAL/PLATELET
Abs Immature Granulocytes: 0.02 10*3/uL (ref 0.00–0.07)
Basophils Absolute: 0.1 10*3/uL (ref 0.0–0.1)
Basophils Relative: 1 %
Eosinophils Absolute: 0.3 10*3/uL (ref 0.0–0.5)
Eosinophils Relative: 5 %
HCT: 35.4 % — ABNORMAL LOW (ref 39.0–52.0)
Hemoglobin: 12.1 g/dL — ABNORMAL LOW (ref 13.0–17.0)
Immature Granulocytes: 0 %
Lymphocytes Relative: 26 %
Lymphs Abs: 1.4 10*3/uL (ref 0.7–4.0)
MCH: 33.9 pg (ref 26.0–34.0)
MCHC: 34.2 g/dL (ref 30.0–36.0)
MCV: 99.2 fL (ref 80.0–100.0)
Monocytes Absolute: 0.5 10*3/uL (ref 0.1–1.0)
Monocytes Relative: 9 %
Neutro Abs: 3.3 10*3/uL (ref 1.7–7.7)
Neutrophils Relative %: 59 %
Platelets: 132 10*3/uL — ABNORMAL LOW (ref 150–400)
RBC: 3.57 MIL/uL — ABNORMAL LOW (ref 4.22–5.81)
RDW: 12.4 % (ref 11.5–15.5)
WBC: 5.6 10*3/uL (ref 4.0–10.5)
nRBC: 0 % (ref 0.0–0.2)

## 2021-07-03 LAB — COMPREHENSIVE METABOLIC PANEL
ALT: 21 U/L (ref 0–44)
AST: 25 U/L (ref 15–41)
Albumin: 3.7 g/dL (ref 3.5–5.0)
Alkaline Phosphatase: 93 U/L (ref 38–126)
Anion gap: 9 (ref 5–15)
BUN: 19 mg/dL (ref 8–23)
CO2: 24 mmol/L (ref 22–32)
Calcium: 8.7 mg/dL — ABNORMAL LOW (ref 8.9–10.3)
Chloride: 103 mmol/L (ref 98–111)
Creatinine, Ser: 1.01 mg/dL (ref 0.61–1.24)
GFR, Estimated: >60 mL/min (ref >60)
Glucose, Bld: 211 mg/dL — ABNORMAL HIGH (ref 70–99)
Potassium: 4.3 mmol/L (ref 3.5–5.1)
Sodium: 136 mmol/L (ref 135–145)
Total Bilirubin: 0.4 mg/dL (ref 0.3–1.2)
Total Protein: 7 g/dL (ref 6.5–8.1)

## 2021-07-03 LAB — IRON AND TIBC
Iron: 90 ug/dL (ref 45–182)
Saturation Ratios: 35 % (ref 17.9–39.5)
TIBC: 256 ug/dL (ref 250–450)
UIBC: 166 ug/dL

## 2021-07-03 LAB — FERRITIN: Ferritin: 160 ng/mL (ref 24–336)

## 2021-07-04 ENCOUNTER — Ambulatory Visit (INDEPENDENT_AMBULATORY_CARE_PROVIDER_SITE_OTHER): Payer: BC Managed Care – PPO | Admitting: *Deleted

## 2021-07-04 DIAGNOSIS — D6869 Other thrombophilia: Secondary | ICD-10-CM | POA: Diagnosis not present

## 2021-07-04 DIAGNOSIS — Z5181 Encounter for therapeutic drug level monitoring: Secondary | ICD-10-CM

## 2021-07-04 DIAGNOSIS — I480 Paroxysmal atrial fibrillation: Secondary | ICD-10-CM | POA: Diagnosis not present

## 2021-07-04 LAB — POCT INR: INR: 2.3 (ref 2.0–3.0)

## 2021-07-04 NOTE — Patient Instructions (Signed)
-  Warfarin 5mg tablet Continue warfarin 1 1/2 tablets daily except 1 tablet on Mondays and Thursdays -Recheck in office in 6 wks 

## 2021-07-10 ENCOUNTER — Inpatient Hospital Stay (HOSPITAL_BASED_OUTPATIENT_CLINIC_OR_DEPARTMENT_OTHER): Payer: BC Managed Care – PPO | Admitting: Hematology

## 2021-07-10 ENCOUNTER — Other Ambulatory Visit: Payer: Self-pay

## 2021-07-10 VITALS — BP 114/60 | HR 62 | Temp 97.8°F | Resp 18 | Ht 76.0 in | Wt 230.9 lb

## 2021-07-10 DIAGNOSIS — C8590 Non-Hodgkin lymphoma, unspecified, unspecified site: Secondary | ICD-10-CM

## 2021-07-10 DIAGNOSIS — D649 Anemia, unspecified: Secondary | ICD-10-CM

## 2021-07-10 DIAGNOSIS — Z8572 Personal history of non-Hodgkin lymphomas: Secondary | ICD-10-CM | POA: Diagnosis not present

## 2021-07-10 NOTE — Progress Notes (Signed)
Kelly [name], [place] [address]   CLINIC:  Medical Oncology/Hematology  PCP:  Celene Squibb, MD [address] [place] [address] [phone]   REASON FOR VISIT:  Follow-up for NHL and normocytic anemia  PRIOR THERAPY: none  NGS Results: not done  CURRENT THERAPY: under work-up  BRIEF ONCOLOGIC HISTORY:  Oncology History   No history exists.    CANCER STAGING: Cancer Staging  No matching staging information was found for the patient.  INTERVAL HISTORY:  [name], a 66 y.o. male, returns for routine follow-up of his NHL and normocytic anemia. Grantley was last seen on 10/17/2020.   Today she reports feeling good. He denies fevers, night sweats, hematochezia, hematuria, and weight loss. He reports stable fatigue. He reports easy bruising with Coumadin. He reports his CP resolved after starting IMDUR. He reports chronic swelling of his right leg.  REVIEW OF SYSTEMS:  Review of Systems  Constitutional:  Positive for fatigue (stable). Negative for appetite change, fever and unexpected weight change.  Cardiovascular:  Positive for leg swelling (R leg). Negative for chest pain (resolved).  Gastrointestinal:  Positive for vomiting. Negative for blood in stool.  Endocrine: Negative for hot flashes.  Genitourinary:  Negative for hematuria.   Neurological:  Positive for numbness.  Hematological:  Bruises/bleeds easily.  Psychiatric/Behavioral:  Positive for sleep disturbance.   All other systems reviewed and are negative.  PAST MEDICAL/SURGICAL HISTORY:  Past Medical History:  Diagnosis Date   Anxiety    Arthritis    Cirrhosis ([facility])    Coronary atherosclerosis of native coronary artery    BMS LAD and PTCA RCA 2000,BMS RCA/PLA 2001, CABG June 2018   Diverticulitis    Essential hypertension    Fatty liver    Gastric ulcer    GERD (gastroesophageal reflux disease)    Hyperlipidemia    Low-grade NHL (non-Hodgkin's  lymphoma) 2007   Lupus anticoagulant positive    MI (myocardial infarction) ([facility]) 2001   Pacemaker    Peripheral neuropathy    Thrombocytopenia ([facility])    Trifascicular block    Type 2 diabetes mellitus ([facility])    Umbilical hernia    Past Surgical History:  Procedure Laterality Date   Axillary abscess     left incision and drainage left   BACK SURGERY     multiple   BIOPSY  08/15/2014   Procedure: BIOPSY;  Surgeon: Danie Binder, MD;  Location: [facility];  Service: Endoscopy;;  Gastric   BIOPSY  01/19/2015   Procedure: BIOPSY (GASTRIC ULCER);  Surgeon: Danie Binder, MD;  Location: [facility];  Service: Endoscopy;;   CARDIAC CATHETERIZATION     cardiac stents     x5 stents   COLONOSCOPY  June 2008   Dr. Dellis Filbert Medoff: Mild left colonic diverticulosis   CORONARY ARTERY BYPASS GRAFT N/A 11/11/2016   Procedure: CORONARY ARTERY BYPASS GRAFTING (CABG) x 3, USING LEFT MAMMARY ARTERY AND RIGHT GREATER SAPHENOUS VEIN HARVESTED ENDOSCOPICALLY;  Surgeon: Ivin Poot, MD;  Location: [facility];  Service: Open Heart Surgery;  Laterality: N/A;   Dental extractions     ESOPHAGOGASTRODUODENOSCOPY  June 2008   Dr. Dellis Filbert Medoff: Gastric ulcer, antral, biopsy with reactive changes associated glandular atrophy, no H pylori. No features of lymphoma. Likely NSAID related   ESOPHAGOGASTRODUODENOSCOPY  August 2008   Dr. Dellis Filbert Medoff: Complete healing of gastric ulcers.   ESOPHAGOGASTRODUODENOSCOPY (EGD) WITH PROPOFOL N/A 08/15/2014   SLF:  1. Abnormal pain due to ulcers & gastriitis    ESOPHAGOGASTRODUODENOSCOPY (EGD) WITH PROPOFOL N/A 01/19/2015   SLF: 1. Persistant ulcer with firm base 2. single gastric polyp found in the gastric antrum. Ulcer base    EUS N/A 03/21/2015   Thickened gastric wall in pre-pyloric antrum, no obvious intramural mass, fatty pancreas, no obvious pancreatic mass.    Inguinal lymph node biopsy     right inguinal lymph node biopsy   INTRAVASCULAR PRESSURE WIRE/FFR STUDY N/A 09/26/2016    Procedure: Intravascular Pressure Wire/FFR Study;  Surgeon: Nelva Bush, MD;  Location: [facility];  Service: Cardiovascular;  Laterality: N/A;   LEFT HEART CATH AND CORONARY ANGIOGRAPHY N/A 09/26/2016   Procedure: Left Heart Cath and Coronary Angiography;  Surgeon: Nelva Bush, MD;  Location: [facility];  Service: Cardiovascular;  Laterality: N/A;   LEFT HEART CATH AND CORS/GRAFTS ANGIOGRAPHY N/A 06/29/2020   Procedure: LEFT HEART CATH AND CORS/GRAFTS ANGIOGRAPHY;  Surgeon: Sherren Mocha, MD;  Location: [facility];  Service: Cardiovascular;  Laterality: N/A;   NECK SURGERY     PACEMAKER IMPLANT N/A 03/12/2020   Procedure: PACEMAKER IMPLANT;  Surgeon: Vickie Epley, MD;  Location: [facility];  Service: Cardiovascular;  Laterality: N/A;   POLYPECTOMY  01/19/2015   Procedure: POLYPECTOMY (GASTRIC);  Surgeon: Danie Binder, MD;  Location: [facility];  Service: Endoscopy;;   ROUX-EN-Y GASTRIC BYPASS     RNY gastric bypass   SHOULDER ARTHROSCOPY Left    TEE WITHOUT CARDIOVERSION N/A 11/11/2016   Procedure: TRANSESOPHAGEAL ECHOCARDIOGRAM (TEE);  Surgeon: Prescott Gum, Collier Salina, MD;  Location: [facility];  Service: Open Heart Surgery;  Laterality: N/A;    SOCIAL HISTORY:  Social History   Socioeconomic History   Marital status: Married    Spouse name: Not on file   Number of children: Not on file   Years of education: Not on file   Highest education level: Not on file  Occupational History   Not on file  Tobacco Use   Smoking status: Former    Packs/day: 1.00    Years: 23.00    Pack years: 23.00    Types: Cigarettes    Start date: 10/30/1970    Quit date: 10/29/1993    Years since quitting: 27.7   Smokeless tobacco: Former    Types: Snuff, Chew  Vaping Use   Vaping Use: Never used  Substance and Sexual Activity   Alcohol use: No    Alcohol/week: 0.0 standard drinks   Drug use: No   Sexual activity: Never    Birth control/protection: None  Other  Topics Concern   Not on file  Social History Narrative   Not on file   Social Determinants of Health   Financial Resource Strain: High Risk   Difficulty of Paying Living Expenses: Hard  Food Insecurity: No Food Insecurity   Worried About Running Out of Food in the Last Year: Never true   Ran Out of Food in the Last Year: Never true  Transportation Needs: No Transportation Needs   Lack of Transportation (Medical): No   Lack of Transportation (Non-Medical): No  Physical Activity: Sufficiently Active   Days of Exercise per Week: 7 days   Minutes of Exercise per Session: 60 min  Stress: Stress Concern Present   Feeling of Stress : Very much  Social Connections: Socially Integrated   Frequency of Communication with Friends and Family: More than three times a week   Frequency of Social  Gatherings with Friends and Family: Never   Attends Religious Services: More than 4 times per year   Active Member of Clubs or Organizations: Yes   Attends Music therapist: More than 4 times per year   Marital Status: Married  Human resources officer Violence: Not At Risk   Fear of Current or Ex-Partner: No   Emotionally Abused: No   Physically Abused: No   Sexually Abused: No    FAMILY HISTORY:  Family History  Problem Relation Age of Onset   Pancreatic cancer Maternal Grandmother    Obesity Mother    Hypertension Mother    Diabetes Father    Thyroid disease Father    Heart disease Father    Colon cancer Neg Hx     CURRENT MEDICATIONS:  Current Outpatient Medications  Medication Sig Dispense Refill   acetaminophen (TYLENOL) 500 MG tablet Take 500-1,000 mg by mouth every 6 (six) hours as needed (for pain.).     amLODipine (NORVASC) 2.5 MG tablet TAKE 1 TABLET(2.5 MG) BY MOUTH DAILY 90 tablet 3   Ascorbic Acid (VITAMIN C) 500 MG CAPS Take 500 mg by mouth daily.     aspirin EC 81 MG tablet Take 1 tablet (81 mg total) by mouth daily. 30 tablet 11   B Complex-C (B-COMPLEX WITH VITAMIN  C) tablet Take 1 tablet by mouth daily.     Cholecalciferol (VITAMIN D3) 50 MCG (2000 UT) TABS Take 2,000 Units by mouth daily.     Continuous Blood Gluc Sensor (FREESTYLE LIBRE SENSOR SYSTEM) MISC Use one sensor every 10 days. 3 each 2   ferrous sulfate 325 (65 FE) MG tablet Take 325 mg by mouth daily.     FLUoxetine (PROZAC) 20 MG capsule Take 20 mg by mouth daily.     gabapentin (NEURONTIN) 600 MG tablet Take 600 mg by mouth at bedtime.     glucose blood (ONETOUCH VERIO) test strip USE AS DIRECTED FOUR- FIVE TIMES DAILY 450 each 1   Insulin Glargine (BASAGLAR KWIKPEN) 100 UNIT/ML SOPN INJECT 80 UNITS INTO SKIN DAILY AT 10 PM 30 mL 2   isosorbide mononitrate (IMDUR) 30 MG 24 hr tablet Take 1 tablet (30 mg total) by mouth daily. 90 tablet 3   Magnesium 250 MG TABS Take by mouth.     Multiple Vitamin (MULTIVITAMIN WITH MINERALS) TABS tablet Take 1 tablet by mouth in the morning and at bedtime.     nitroGLYCERIN (NITROSTAT) 0.4 MG SL tablet Place 1 tablet (0.4 mg total) under the tongue every 5 (five) minutes x 3 doses as needed for chest pain. 25 tablet 3   ONETOUCH VERIO test strip USE TO TEST FOUR TIMES DAILY AS DIRECTED 450 each 2   oxyCODONE-acetaminophen (PERCOCET/ROXICET) 5-325 MG tablet Take 1 tablet by mouth every 4 (four) hours as needed for severe pain.     pantoprazole (PROTONIX) 40 MG tablet TAKE 1 TABLET BY MOUTH DAILY BEFORE BREAKFAST 90 tablet 0   Pediatric Multivitamins-Iron (FLINTSTONES COMPLETE PO) Take 1 tablet by mouth daily.     simvastatin (ZOCOR) 10 MG tablet TAKE 1 TABLET BY MOUTH EVERY NIGHT AT BEDTIME 30 tablet 2   Sodium Bicarbonate-Citric Acid (ALKA-SELTZER HEARTBURN) 1940-1000 MG TBEF Take 1-2 tablets by mouth 2 (two) times daily as needed (heartburn/indigestion).     warfarin (COUMADIN) 5 MG tablet Take 1 1/2 tablets daily except 1 tablet on Mondays and Thursdays or as directed 45 tablet 3   No current facility-administered medications for this visit.  ALLERGIES:  No Known Allergies  PHYSICAL EXAM:  Performance status (ECOG): 1 - Symptomatic but completely ambulatory  There were no vitals filed for this visit. Wt Readings from Last 3 Encounters:  05/09/21 232 lb 12.8 oz (105.6 kg)  12/18/20 233 lb (105.7 kg)  10/17/20 227 lb 6.4 oz (103.1 kg)   Physical Exam Vitals reviewed.  Constitutional:      Appearance: Normal appearance.  Cardiovascular:     Rate and Rhythm: Normal rate and regular rhythm.     Pulses: Normal pulses.     Heart sounds: Normal heart sounds.  Pulmonary:     Effort: Pulmonary effort is normal.     Breath sounds: Normal breath sounds.  Abdominal:     Palpations: Abdomen is soft. There is no hepatomegaly, splenomegaly or mass.     Tenderness: There is no abdominal tenderness.  Musculoskeletal:     Right lower leg: Edema (lymphedema) present.     Left lower leg: No edema.  Lymphadenopathy:     Cervical: No cervical adenopathy.     Right cervical: No superficial cervical adenopathy.    Left cervical: No superficial cervical adenopathy.     Upper Body:     Right upper body: No supraclavicular, axillary or pectoral adenopathy.     Left upper body: No supraclavicular, axillary or pectoral adenopathy.     Lower Body: No right inguinal adenopathy. No left inguinal adenopathy.  Neurological:     General: No focal deficit present.     Mental Status: He is alert and oriented to person, place, and time.  Psychiatric:        Mood and Affect: Mood normal.        Behavior: Behavior normal.     LABORATORY DATA:  I have reviewed the labs as listed.  CBC Latest Ref Rng & Units 07/03/2021 10/17/2020 06/18/2020  WBC 4.0 - 10.5 K/uL 5.6 5.5 5.5  Hemoglobin 13.0 - 17.0 g/dL 12.1(L) 11.9(L) 11.6(L)  Hematocrit 39.0 - 52.0 % 35.4(L) 35.6(L) 34.8(L)  Platelets 150 - 400 K/uL 132(L) 118(L) 138(L)   CMP Latest Ref Rng & Units 07/03/2021 06/18/2020 03/28/2020  Glucose 70 - 99 mg/dL 211(H) 189(H) 180(H)  BUN 8 - 23 mg/dL  19 16 18   Creatinine 0.61 - 1.24 mg/dL 1.01 0.79 0.92  Sodium 135 - 145 mmol/L 136 141 137  Potassium 3.5 - 5.1 mmol/L 4.3 4.1 4.2  Chloride 98 - 111 mmol/L 103 105 100  CO2 22 - 32 mmol/L 24 23 29   Calcium 8.9 - 10.3 mg/dL 8.7(L) 8.8 9.0  Total Protein 6.5 - 8.1 g/dL 7.0 - -  Total Bilirubin 0.3 - 1.2 mg/dL 0.4 - -  Alkaline Phos 38 - 126 U/L 93 - -  AST 15 - 41 U/L 25 - -  ALT 0 - 44 U/L 21 - -    DIAGNOSTIC IMAGING:  I have independently reviewed the scans and discussed with the patient. CUP PACEART REMOTE DEVICE CHECK  Result Date: 06/12/2021 Scheduled remote reviewed. Normal device function.  22 AMS episodes showing AF, some undersensing, rates controlled.   Longest episode 9hrs 59min Hx of PAF, Warfarin prescribed Next remote 91 days. LA    ASSESSMENT:  1.  Low-grade follicular lymphoma: - Right inguinal lymph node excision biopsy on 09/15/2005 consistent with grade 2 follicular lymphoma, mixed cell type.  Cells are positive for Bcl-2, CD20 and CD79a.  CD10 is negative. - He was under watchful observation and did not require any treatment over the  years. - Last CT CAP on 05/29/2017 at [facility] showed enlarged mediastinal nodes, largest 1.7 x 2.4 cm right infrahilar node.  Prominent but not enlarged abdominal retroperitoneal nodes.  Right middle lobe 7 mm nodule.  Fatty cirrhotic liver.  Normal spleen.   2.  Social/family history: - He works as a Water engineer at [employer].  He quit smoking in 1995.  He smoked 1 pack/day for 20+ years. - Paternal grandmother had pancreatic cancer.  Maternal grandmother died of CVA in her 74s.  Father was on blood thinners.   3.  Normocytic anemia: - Last colonoscopy was in 2008 which showed normal colon with diverticulosis on the left side. - EGD on 03/21/2015 showed nodularity and ulcerations and atrophy seen in prepyloric antrum.  EUS showed thickened gastric wall in the prepyloric antrum with no mass.  4.  Recurrent DVTs: - He had a  history of DVT x4 in his legs.  No history of pulmonary embolism.    PLAN:  1.  Low-grade follicular lymphoma: - He does not have any fevers, night sweats or weight loss in the last 6 months. - Does not have any lymphadenopathy or splenomegaly palpable. - Right lower extremity lymphedema since lymph node biopsy stable. - No indication for treatment at this time.   2.  Normocytic anemia: - He has very mild normocytic anemia which is stable.  Hemoglobin is around 12.  Previous nutritional deficiency work-up negative.  Current labs show ferritin 160 with percent saturation 35. - We will plan to check Q65, folic acid, MMA along with ferritin and iron panel at next visit in 6 months.   3.  Mild thrombocytopenia: - He has mild thrombocytopenia since 2008. - No bleeding issues.  Likely immune mediated.   4.  Positive lupus anticoagulant with recurrent DVTs/A-fib: - Continue Coumadin indefinitely.   Orders placed this encounter:  No orders of the defined types were placed in this encounter.    Derek Jack, MD [facility] [phone]   I, Thana Ates, am acting as a scribe for Dr. Derek Jack.  I, Derek Jack MD, have reviewed the above documentation for accuracy and completeness, and I agree with the above.

## 2021-07-10 NOTE — Patient Instructions (Addendum)
[place] at [facility] Discharge Instructions  You were seen and examined today by Dr. Delton Coombes. He reviewed your most recent labs and everything looks stable. Please keep follow up appointment as scheduled in 6 months.   Thank you for choosing [place] at [facility] to provide your oncology and hematology care.  To afford each patient quality time with our provider, please arrive at least 15 minutes before your scheduled appointment time.   If you have a lab appointment with the [facility] please come in thru the Main Entrance and check in at the main information desk.  You need to re-schedule your appointment should you arrive 10 or more minutes late.  We strive to give you quality time with our providers, and arriving late affects you and other patients whose appointments are after yours.  Also, if you no show three or more times for appointments you may be dismissed from the clinic at the providers discretion.     Again, thank you for choosing [facility].  Our hope is that these requests will decrease the amount of time that you wait before being seen by our physicians.       _____________________________________________________________  Should you have questions after your visit to [facility] , please contact our office at [phone] and follow the prompts.  Our office hours are 8:00 a.m. and 4:30 p.m. Monday - Friday.  Please note that voicemails left after 4:00 p.m. may not be returned until the following business day.  We are closed weekends and major holidays.  You do have access to a nurse 24-7, just call the main number to the clinic [phone] and do not press any options, hold on the line and a nurse will answer the phone.    For prescription refill requests, have your pharmacy contact our office and allow 72 hours.    Due to Covid, you will need to wear a mask upon entering the hospital. If you  do not have a mask, a mask will be given to you at the Main Entrance upon arrival. For doctor visits, patients may have 1 support person age 43 or older with them. For treatment visits, patients can not have anyone with them due to social distancing guidelines and our immunocompromised population.

## 2021-07-24 ENCOUNTER — Encounter: Payer: Self-pay | Admitting: *Deleted

## 2021-07-28 DIAGNOSIS — K219 Gastro-esophageal reflux disease without esophagitis: Secondary | ICD-10-CM | POA: Insufficient documentation

## 2021-07-30 ENCOUNTER — Ambulatory Visit: Payer: BC Managed Care – PPO | Admitting: Diagnostic Neuroimaging

## 2021-07-30 DIAGNOSIS — G4700 Insomnia, unspecified: Secondary | ICD-10-CM | POA: Insufficient documentation

## 2021-07-30 DIAGNOSIS — I82409 Acute embolism and thrombosis of unspecified deep veins of unspecified lower extremity: Secondary | ICD-10-CM | POA: Insufficient documentation

## 2021-07-30 DIAGNOSIS — R252 Cramp and spasm: Secondary | ICD-10-CM | POA: Insufficient documentation

## 2021-08-15 ENCOUNTER — Ambulatory Visit (INDEPENDENT_AMBULATORY_CARE_PROVIDER_SITE_OTHER): Payer: BC Managed Care – PPO | Admitting: *Deleted

## 2021-08-15 DIAGNOSIS — D6869 Other thrombophilia: Secondary | ICD-10-CM | POA: Diagnosis not present

## 2021-08-15 DIAGNOSIS — I480 Paroxysmal atrial fibrillation: Secondary | ICD-10-CM | POA: Diagnosis not present

## 2021-08-15 DIAGNOSIS — Z5181 Encounter for therapeutic drug level monitoring: Secondary | ICD-10-CM | POA: Diagnosis not present

## 2021-08-15 LAB — POCT INR: INR: 3 (ref 2.0–3.0)

## 2021-08-15 NOTE — Patient Instructions (Signed)
-  Warfarin 5mg tablet Continue warfarin 1 1/2 tablets daily except 1 tablet on Mondays and Thursdays -Recheck in office in 6 wks 

## 2021-09-10 ENCOUNTER — Ambulatory Visit (INDEPENDENT_AMBULATORY_CARE_PROVIDER_SITE_OTHER): Payer: BC Managed Care – PPO

## 2021-09-10 DIAGNOSIS — I453 Trifascicular block: Secondary | ICD-10-CM

## 2021-09-11 LAB — CUP PACEART REMOTE DEVICE CHECK
Battery Remaining Longevity: 82 mo
Battery Remaining Percentage: 83 %
Battery Voltage: 2.99 V
Brady Statistic AP VP Percent: 49 %
Brady Statistic AP VS Percent: 1 %
Brady Statistic AS VP Percent: 49 %
Brady Statistic AS VS Percent: 1 %
Brady Statistic RA Percent Paced: 47 %
Brady Statistic RV Percent Paced: 98 %
Date Time Interrogation Session: 20230425184445
Implantable Lead Implant Date: 20211025
Implantable Lead Implant Date: 20211025
Implantable Lead Location: 753859
Implantable Lead Location: 753860
Implantable Pulse Generator Implant Date: 20211025
Lead Channel Impedance Value: 300 Ohm
Lead Channel Impedance Value: 460 Ohm
Lead Channel Pacing Threshold Amplitude: 1 V
Lead Channel Pacing Threshold Amplitude: 1.5 V
Lead Channel Pacing Threshold Pulse Width: 0.4 ms
Lead Channel Pacing Threshold Pulse Width: 0.6 ms
Lead Channel Sensing Intrinsic Amplitude: 2.3 mV
Lead Channel Sensing Intrinsic Amplitude: 5.8 mV
Lead Channel Setting Pacing Amplitude: 1.25 V
Lead Channel Setting Pacing Amplitude: 2.5 V
Lead Channel Setting Pacing Pulse Width: 0.4 ms
Lead Channel Setting Sensing Sensitivity: 2.5 mV
Pulse Gen Model: 2272
Pulse Gen Serial Number: 3873891

## 2021-09-16 ENCOUNTER — Other Ambulatory Visit: Payer: Self-pay | Admitting: Cardiology

## 2021-09-26 ENCOUNTER — Ambulatory Visit (INDEPENDENT_AMBULATORY_CARE_PROVIDER_SITE_OTHER): Payer: BC Managed Care – PPO | Admitting: *Deleted

## 2021-09-26 DIAGNOSIS — I480 Paroxysmal atrial fibrillation: Secondary | ICD-10-CM | POA: Diagnosis not present

## 2021-09-26 DIAGNOSIS — D6869 Other thrombophilia: Secondary | ICD-10-CM | POA: Diagnosis not present

## 2021-09-26 DIAGNOSIS — Z5181 Encounter for therapeutic drug level monitoring: Secondary | ICD-10-CM | POA: Diagnosis not present

## 2021-09-26 LAB — POCT INR: INR: 3 (ref 2.0–3.0)

## 2021-09-26 NOTE — Progress Notes (Signed)
Remote pacemaker transmission.   

## 2021-09-26 NOTE — Patient Instructions (Signed)
-  Warfarin 5mg tablet -Continue warfarin 1 1/2 tablets daily except 1 tablet on Mondays and Thursdays -Recheck in office in 7 wks 

## 2021-10-18 ENCOUNTER — Encounter: Payer: Self-pay | Admitting: Physician Assistant

## 2021-10-18 ENCOUNTER — Ambulatory Visit: Payer: BC Managed Care – PPO | Admitting: Physician Assistant

## 2021-10-18 VITALS — BP 126/64 | HR 65 | Ht 76.0 in | Wt 220.0 lb

## 2021-10-18 DIAGNOSIS — Z950 Presence of cardiac pacemaker: Secondary | ICD-10-CM | POA: Diagnosis not present

## 2021-10-18 DIAGNOSIS — I4891 Unspecified atrial fibrillation: Secondary | ICD-10-CM

## 2021-10-18 DIAGNOSIS — I480 Paroxysmal atrial fibrillation: Secondary | ICD-10-CM | POA: Diagnosis not present

## 2021-10-18 DIAGNOSIS — I739 Peripheral vascular disease, unspecified: Secondary | ICD-10-CM | POA: Diagnosis not present

## 2021-10-18 DIAGNOSIS — R011 Cardiac murmur, unspecified: Secondary | ICD-10-CM | POA: Diagnosis not present

## 2021-10-18 DIAGNOSIS — I25118 Atherosclerotic heart disease of native coronary artery with other forms of angina pectoris: Secondary | ICD-10-CM

## 2021-10-18 LAB — CUP PACEART INCLINIC DEVICE CHECK
Battery Remaining Longevity: 84 mo
Battery Voltage: 3.01 V
Brady Statistic RA Percent Paced: 47 %
Brady Statistic RV Percent Paced: 98 %
Date Time Interrogation Session: 20230602165908
Implantable Lead Implant Date: 20211025
Implantable Lead Implant Date: 20211025
Implantable Lead Location: 753859
Implantable Lead Location: 753860
Implantable Pulse Generator Implant Date: 20211025
Lead Channel Impedance Value: 300 Ohm
Lead Channel Impedance Value: 437.5 Ohm
Lead Channel Pacing Threshold Amplitude: 0.875 V
Lead Channel Pacing Threshold Amplitude: 1.5 V
Lead Channel Pacing Threshold Amplitude: 1.5 V
Lead Channel Pacing Threshold Pulse Width: 0.4 ms
Lead Channel Pacing Threshold Pulse Width: 0.6 ms
Lead Channel Pacing Threshold Pulse Width: 0.6 ms
Lead Channel Sensing Intrinsic Amplitude: 10.6 mV
Lead Channel Sensing Intrinsic Amplitude: 2.8 mV
Lead Channel Setting Pacing Amplitude: 1.125
Lead Channel Setting Pacing Amplitude: 2.5 V
Lead Channel Setting Pacing Pulse Width: 0.4 ms
Lead Channel Setting Sensing Sensitivity: 2.5 mV
Pulse Gen Model: 2272
Pulse Gen Serial Number: 3873891

## 2021-10-18 NOTE — Patient Instructions (Addendum)
Medication Instructions:    Your physician recommends that you continue on your current medications as directed. Please refer to the Current Medication list given to you today.   *If you need a refill on your cardiac medications before your next appointment, please call your pharmacy*   Lab Work: [place]   If you have labs (blood work) drawn today and your tests are completely normal, you will receive your results only by: [place] (if you have MyChart) OR A paper copy in the mail If you have any lab test that is abnormal or we need to change your treatment, we will call you to review the results.   Testing/Procedures: Your physician has requested that you have an echocardiogram. Echocardiography is a painless test that uses sound waves to create images of your heart. It provides your doctor with information about the size and shape of your heart and how well your heart's chambers and valves are working. This procedure takes approximately one hour. There are no restrictions for this procedure.   Your physician has requested that you have an ankle brachial index (ABI). During this test an ultrasound and blood pressure cuff are used to evaluate the arteries that supply the arms and legs with blood. Allow thirty minutes for this exam. There are no restrictions or special instructions.   Follow-Up: At [facility], you and your health needs are our priority.  As part of our continuing mission to provide you with exceptional heart care, we have created designated Provider Care Teams.  These Care Teams include your primary Cardiologist (physician) and Advanced Practice Providers (APPs -  Physician Assistants and Nurse Practitioners) who all work together to provide you with the care you need, when you need it.  We recommend signing up for the patient portal called "MyChart".  Sign up information is provided on this After Visit Summary.  MyChart is used to connect with  patients for Virtual Visits (Telemedicine).  Patients are able to view lab/test results, encounter notes, upcoming appointments, etc.  Non-urgent messages can be sent to your provider as well.   To learn more about what you can do with MyChart, go to [email or web address].    Your next appointment:   1 year(s)  The format for your next appointment:   In Person  Provider:   You may see Vickie Epley, MD or one of the following Advanced Practice Providers on your designated Care Team:   Tommye Standard, [place] Legrand Como "Jonni Sanger" Chalmers Cater, [place]    Other Instructions   Important Information About [place]

## 2021-10-18 NOTE — Progress Notes (Signed)
Cardiology Office Note Date:  10/18/2021  Patient ID:  Kelly [name], DOB 1955/11/24, MRN [id number] PCP:  Celene Squibb, MD  Cardiologist:  Dr. Domenic Polite Electrophysiologist: Dr. Quentin Ore     Chief Complaint:  annual EP visit  History of Present Illness: Kelly [name] is a 66 y.o. male with history of CAD (CABG 2018), HTN, HLD, low-grade NH Lymphoma, lupus/lupus anticoagulant, DM, advanced heart block with PPM, VHD (AS)  He comes in today to be seen for Dr. Quentin Ore, last seen by him April 2022, doing well, RA lead programming adjustments to improve battery longevity/keeping safety, from 2.5 V at 1 ms to 1.5 V at 0.6 ms unipolar with improvement of his battery longevity to 9.2 years from 7.4 years  Referred to the AFib clinic Aug 2022 with development of AFib noted by his device, started on warfarin given lupus anticoagulant (antiphospholipid syndrome)  He re-established with Dr. Domenic Polite Dec 2022, with some CP Imdur resumed.  TODAY He is having more CP. None at rest, none with his usual ADLs, though with increased level of exertion or stress he gets chest discomfort and SOB, slowing/stopping or taking s/l NTG resolves it. This is the same level of exertion and behavior as it always has been but his amount and frequency os his work stress is increased  No rest symptoms  A week ago when working on something ion the basement he became very lightheaded/dizzy, no CP, no palpitations, but felt weak, went upstairs and laid down in his recliner, feeling better, but stayed an hour resting.  This has not been recurrent. He did not feel like he was fainting.  He gets pain his legs with ambulation, RLE chronic aching for some years but has b/l thigh and calf pain when ambulating, improves with rest, denies any wounds or skin changes.   Device information Abbott dual chamber PPM implanted 03/12/2020  AFib/AAD Hx Diagnosed July 2022   Past Medical History:  Diagnosis Date   Anxiety     Arthritis    Chronic pain    Cirrhosis ([place])    Coronary atherosclerosis of native coronary artery    BMS LAD and PTCA RCA 2000,BMS RCA/PLA 2001, CABG June 2018   Diverticulitis    Essential hypertension    Fatty liver    Gastric ulcer    GERD (gastroesophageal reflux disease)    Hyperlipidemia    Low-grade NHL (non-Hodgkin's lymphoma) 2007   Lupus anticoagulant positive    MI (myocardial infarction) ([place]) 2001   Pacemaker    Paresthesia of upper limb    Peripheral neuropathy    Thrombocytopenia ([facility])    Trifascicular block    Type 2 diabetes mellitus ([place])    Umbilical hernia     Past Surgical History:  Procedure Laterality Date   Axillary abscess     left incision and drainage left   BACK SURGERY     multiple   BIOPSY  08/15/2014   Procedure: BIOPSY;  Surgeon: Danie Binder, MD;  Location: [facility];  Service: Endoscopy;;  Gastric   BIOPSY  01/19/2015   Procedure: BIOPSY (GASTRIC ULCER);  Surgeon: Danie Binder, MD;  Location: [facility];  Service: Endoscopy;;   CARDIAC CATHETERIZATION     cardiac stents     x5 stents   COLONOSCOPY  June 2008   Dr. Dellis Filbert Medoff: Mild left colonic diverticulosis   CORONARY ARTERY BYPASS GRAFT N/A 11/11/2016   Procedure: CORONARY ARTERY BYPASS GRAFTING (CABG) x 3, USING LEFT MAMMARY  ARTERY AND RIGHT GREATER SAPHENOUS VEIN HARVESTED ENDOSCOPICALLY;  Surgeon: Ivin Poot, MD;  Location: [facility];  Service: Open Heart Surgery;  Laterality: N/A;   Dental extractions     ESOPHAGOGASTRODUODENOSCOPY  June 2008   Dr. Dellis Filbert Medoff: Gastric ulcer, antral, biopsy with reactive changes associated glandular atrophy, no H pylori. No features of lymphoma. Likely NSAID related   ESOPHAGOGASTRODUODENOSCOPY  August 2008   Dr. Dellis Filbert Medoff: Complete healing of gastric ulcers.   ESOPHAGOGASTRODUODENOSCOPY (EGD) WITH PROPOFOL N/A 08/15/2014   SLF: 1. Abnormal pain due to ulcers & gastriitis    ESOPHAGOGASTRODUODENOSCOPY (EGD) WITH PROPOFOL N/A 01/19/2015    SLF: 1. Persistant ulcer with firm base 2. single gastric polyp found in the gastric antrum. Ulcer base    EUS N/A 03/21/2015   Thickened gastric wall in pre-pyloric antrum, no obvious intramural mass, fatty pancreas, no obvious pancreatic mass.    Inguinal lymph node biopsy     right inguinal lymph node biopsy   INTRAVASCULAR PRESSURE WIRE/FFR STUDY N/A 09/26/2016   Procedure: Intravascular Pressure Wire/FFR Study;  Surgeon: Nelva Bush, MD;  Location: [facility];  Service: Cardiovascular;  Laterality: N/A;   LEFT HEART CATH AND CORONARY ANGIOGRAPHY N/A 09/26/2016   Procedure: Left Heart Cath and Coronary Angiography;  Surgeon: Nelva Bush, MD;  Location: [facility];  Service: Cardiovascular;  Laterality: N/A;   LEFT HEART CATH AND CORS/GRAFTS ANGIOGRAPHY N/A 06/29/2020   Procedure: LEFT HEART CATH AND CORS/GRAFTS ANGIOGRAPHY;  Surgeon: Sherren Mocha, MD;  Location: [facility];  Service: Cardiovascular;  Laterality: N/A;   NECK SURGERY     PACEMAKER IMPLANT N/A 03/12/2020   Procedure: PACEMAKER IMPLANT;  Surgeon: Vickie Epley, MD;  Location: [facility];  Service: Cardiovascular;  Laterality: N/A;   POLYPECTOMY  01/19/2015   Procedure: POLYPECTOMY (GASTRIC);  Surgeon: Danie Binder, MD;  Location: [facility];  Service: Endoscopy;;   ROUX-EN-Y GASTRIC BYPASS     RNY gastric bypass   SHOULDER ARTHROSCOPY Left    TEE WITHOUT CARDIOVERSION N/A 11/11/2016   Procedure: TRANSESOPHAGEAL ECHOCARDIOGRAM (TEE);  Surgeon: Prescott Gum, Collier Salina, MD;  Location: [facility];  Service: Open Heart Surgery;  Laterality: N/A;    Current Outpatient Medications  Medication Sig Dispense Refill   acetaminophen (TYLENOL) 500 MG tablet Take 500-1,000 mg by mouth every 6 (six) hours as needed (for pain.).     amLODipine (NORVASC) 2.5 MG tablet TAKE 1 TABLET(2.5 MG) BY MOUTH DAILY 90 tablet 3   Ascorbic Acid (VITAMIN C) 500 MG CAPS Take 500 mg by mouth daily.     aspirin EC 81 MG tablet  Take 1 tablet (81 mg total) by mouth daily. 30 tablet 11   B Complex-C (B-COMPLEX WITH VITAMIN C) tablet Take 1 tablet by mouth daily.     Cholecalciferol (VITAMIN D3) 50 MCG (2000 UT) TABS Take 2,000 Units by mouth daily.     Continuous Blood Gluc Sensor (FREESTYLE LIBRE SENSOR SYSTEM) MISC Use one sensor every 10 days. 3 each 2   ferrous sulfate 325 (65 FE) MG tablet Take 325 mg by mouth daily.     FLUoxetine (PROZAC) 20 MG capsule Take 20 mg by mouth daily.     gabapentin (NEURONTIN) 600 MG tablet Take 600 mg by mouth at bedtime.     glucose blood (ONETOUCH VERIO) test strip USE AS DIRECTED FOUR- FIVE TIMES DAILY 450 each 1   Insulin Glargine (BASAGLAR KWIKPEN) 100 UNIT/ML SOPN INJECT 80 UNITS INTO  SKIN DAILY AT 10 PM 30 mL 2   isosorbide mononitrate (IMDUR) 30 MG 24 hr tablet Take 1 tablet (30 mg total) by mouth daily. 90 tablet 3   Magnesium 250 MG TABS Take by mouth.     Multiple Vitamin (MULTIVITAMIN WITH MINERALS) TABS tablet Take 1 tablet by mouth in the morning and at bedtime.     nitroGLYCERIN (NITROSTAT) 0.4 MG SL tablet Place 1 tablet (0.4 mg total) under the tongue every 5 (five) minutes x 3 doses as needed for chest pain. 25 tablet 3   ONETOUCH VERIO test strip USE TO TEST FOUR TIMES DAILY AS DIRECTED 450 each 2   oxyCODONE-acetaminophen (PERCOCET/ROXICET) 5-325 MG tablet Take 1 tablet by mouth every 4 (four) hours as needed for severe pain.     pantoprazole (PROTONIX) 40 MG tablet TAKE 1 TABLET BY MOUTH DAILY BEFORE BREAKFAST 90 tablet 0   Pediatric Multivitamins-Iron (FLINTSTONES COMPLETE PO) Take 1 tablet by mouth daily.     simvastatin (ZOCOR) 10 MG tablet TAKE 1 TABLET BY MOUTH EVERY NIGHT AT BEDTIME 30 tablet 2   Sodium Bicarbonate-Citric Acid (ALKA-SELTZER HEARTBURN) 1940-1000 MG TBEF Take 1-2 tablets by mouth 2 (two) times daily as needed (heartburn/indigestion).     warfarin (COUMADIN) 5 MG tablet TAKE 1 AND 1/2 TABLETS BY MOUTH ON SUNDAY, TUES, WEDNESDAY, FRIDAY, AND  SATURDAY. TAKE 1 TABLET ON MONDAYS AND THURSDAYS OR AS DIRECTED 45 tablet 5   No current facility-administered medications for this visit.    Allergies:   Patient has no known allergies.   Social History:  The patient  reports that he quit smoking about 27 years ago. His smoking use included cigarettes. He started smoking about 51 years ago. He has a 23.00 pack-year smoking history. He has quit using smokeless tobacco.  His smokeless tobacco use included snuff and chew. He reports that he does not drink alcohol and does not use drugs.   Family History:  The patient's family history includes Diabetes in his father; Heart disease in his father; Hypertension in his mother; Obesity in his mother; Pancreatic cancer in his maternal grandmother; Thyroid disease in his father.  ROS:  Please see the history of present illness.    All other systems are reviewed and otherwise negative.   PHYSICAL EXAM:  VS:  BP 126/64   Pulse 65   Ht '6\' 4"'$  (1.93 m)   Wt 220 lb (99.8 kg)   SpO2 95%   BMI 26.78 kg/m  BMI: Body mass index is 26.78 kg/m. Well nourished, well developed, in no acute distress HEENT: normocephalic, atraumatic Neck: no JVD, carotid bruits or masses Cardiac:  RRR; he has a loud holo-stystolic murmur, sounds of AS, no rubs, or gallops Lungs:  CTA b/l, no wheezing, rhonchi or rales Abd: soft, nontender MS: no deformity or atrophy Ext: marked lymphedema RLE (chronic but worse over the years after his lymph node biopsy./ressection years ago as well as the SVG harvest after that) Skin: warm and dry, no rash Neuro:  No gross deficits appreciated Psych: euthymic mood, full affect  PPM site is stable, no tethering or discomfort   EKG:  done today and reviewed by myself SR/V paced, AV paced 75bpm  Device interrogation done today and reviewed by myself:  Battery and lead measurements are good +AMS/AF episodes, though are short, and burden <1% One old A lead noise reversion from Aug  2022, unable to reproduce today No HVE Nothing noted the day of his dizzy spell  Echocardiogram 02/21/2020:  1. Left ventricular ejection fraction, by estimation, is 50 to 55%. The  left ventricle has low normal function. The left ventricle has no regional  wall motion abnormalities. Left ventricular diastolic parameters are  indeterminate.   2. Right ventricular systolic function is normal. The right ventricular  size is normal.   3. Left atrial size was moderately dilated.   4. Right atrial size was mild to moderately dilated.   5. The mitral valve is normal in structure. Mild mitral valve  regurgitation. No evidence of mitral stenosis.   6. The aortic valve is tricuspid. There is moderate calcification of the  aortic valve. There is moderate thickening of the aortic valve. Aortic  valve regurgitation is not visualized. Mild to moderate aortic valve  stenosis. Aortic valve mean gradient  measures 15.6 mmHg. Aortic valve peak gradient measures 28.8 mmHg. Aortic  valve area, by VTI measures 1.14 cm.   7. The inferior vena cava is normal in size with greater than 50%  respiratory variability, suggesting right atrial pressure of 3 mmHg.    Cardiac catheterization 06/29/2020: 1.  Severe native three-vessel coronary artery disease with severe stenosis of the proximal LAD and associated heavy calcification, chronic total occlusion of the ostial RCA, and moderately severe left circumflex stenosis 2.  Status post aortocoronary bypass surgery with continued patency of the LIMA to LAD graft, saphenous vein graft to second obtuse marginal, and saphenous vein graft to right PDA. 3.  Normal LVEDP   Recommendations: The patient has patency of all bypass conduits now 4 years out from coronary bypass surgery.  He has distal vessel disease, not amenable to PCI.  Recommend ongoing medical therapy/increase antianginal program.  Will add low-dose isosorbide as tolerated.   Recent Labs: 07/03/2021: ALT  21; BUN 19; Creatinine, Ser 1.01; Hemoglobin 12.1; Platelets 132; Potassium 4.3; Sodium 136  No results found for requested labs within last 8760 hours.   CrCl cannot be calculated (Patient's most recent lab result is older than the maximum 21 days allowed.).   Wt Readings from Last 3 Encounters:  10/18/21 220 lb (99.8 kg)  07/10/21 230 lb 14.4 oz (104.7 kg)  05/09/21 232 lb 12.8 oz (105.6 kg)     Other studies reviewed: Additional studies/records reviewed today include: summarized above  ASSESSMENT AND PLAN:  PPM Intact function No findings of arrhythmias or pacer malfunction to explain his dizzy spell last week  Paroxysmal AFib CHA2DS2Vasc is 3, on warfarin, monitored and managed by [place] <1% % burden  CAD Sounds like stable angina, the behavior of his CP is unchanged, the trigger is more often Stable angina Discussed if any stress management options for him. He has a significant SM, sounds like AS Will get an echo 1st He sees dr. Domenic Polite in a few weeks  4. ?claudication Some of his description sounds musculoskeletal, some like claudication He has his work boots on that are heavy and hard for him to get on/off, and not removed today, denies any skin changes or discoloration No rest pain Will get LE arterial studies  5. Chronic lymphedema RLE Encouraged him to talk with his PMD if he may benefit from a vascular evaluation/lymphedema clinic  6. Weak spell, ?dizzy, not recurrent Unclear F/u on echo   Disposition: F/u with remotes as usual, in clinic with EP in a year, sooner if needed  Current medicines are reviewed at length with the patient today.  The patient did not have any concerns regarding medicines.  Signed, Joseph Art  Olevia Bowens 10/18/2021 3:40 PM     [facility] [address] [place] [address] [phone] (office)  [phone] (fax)

## 2021-10-21 NOTE — Addendum Note (Signed)
Addended by: Gwendlyn Deutscher on: 10/21/2021 08:48 AM   Modules accepted: Orders

## 2021-10-28 ENCOUNTER — Ambulatory Visit (INDEPENDENT_AMBULATORY_CARE_PROVIDER_SITE_OTHER): Payer: BC Managed Care – PPO

## 2021-10-28 DIAGNOSIS — I739 Peripheral vascular disease, unspecified: Secondary | ICD-10-CM | POA: Diagnosis not present

## 2021-10-28 DIAGNOSIS — R011 Cardiac murmur, unspecified: Secondary | ICD-10-CM

## 2021-10-28 LAB — ECHOCARDIOGRAM COMPLETE
AR max vel: 1.19 cm2
AV Area VTI: 1.23 cm2
AV Area mean vel: 1.17 cm2
AV Mean grad: 15.4 mmHg
AV Peak grad: 27.1 mmHg
Ao pk vel: 2.6 m/s
Area-P 1/2: 1.63 cm2
Calc EF: 51.2 %
MV M vel: 3.33 m/s
MV Peak grad: 44.4 mmHg
P 1/2 time: 3805 msec
S' Lateral: 4.29 cm
Single Plane A2C EF: 52.7 %
Single Plane A4C EF: 49.9 %

## 2021-11-11 ENCOUNTER — Encounter: Payer: Self-pay | Admitting: Cardiology

## 2021-11-11 ENCOUNTER — Ambulatory Visit: Payer: BC Managed Care – PPO | Admitting: Cardiology

## 2021-11-11 VITALS — BP 128/60 | HR 71 | Ht 76.0 in | Wt 231.6 lb

## 2021-11-11 DIAGNOSIS — I480 Paroxysmal atrial fibrillation: Secondary | ICD-10-CM

## 2021-11-11 DIAGNOSIS — I25119 Atherosclerotic heart disease of native coronary artery with unspecified angina pectoris: Secondary | ICD-10-CM | POA: Diagnosis not present

## 2021-11-11 DIAGNOSIS — I350 Nonrheumatic aortic (valve) stenosis: Secondary | ICD-10-CM | POA: Diagnosis not present

## 2021-11-11 MED ORDER — ISOSORBIDE MONONITRATE ER 30 MG PO TB24: 30.0000 mg | ORAL_TABLET | Freq: Two times a day (BID) | ORAL | 3 refills | Status: DC

## 2021-11-22 ENCOUNTER — Encounter: Payer: Self-pay | Admitting: *Deleted

## 2021-12-02 ENCOUNTER — Ambulatory Visit (INDEPENDENT_AMBULATORY_CARE_PROVIDER_SITE_OTHER): Payer: BC Managed Care – PPO | Admitting: *Deleted

## 2021-12-02 DIAGNOSIS — I480 Paroxysmal atrial fibrillation: Secondary | ICD-10-CM | POA: Diagnosis not present

## 2021-12-02 DIAGNOSIS — D6869 Other thrombophilia: Secondary | ICD-10-CM | POA: Diagnosis not present

## 2021-12-02 DIAGNOSIS — Z5181 Encounter for therapeutic drug level monitoring: Secondary | ICD-10-CM

## 2021-12-02 LAB — POCT INR: INR: 2.3 (ref 2.0–3.0)

## 2021-12-02 NOTE — Patient Instructions (Signed)
-  Warfarin '5mg'$  tablet -Continue warfarin 1 1/2 tablets daily except 1 tablet on Mondays and Thursdays -Recheck in office in 7 wks

## 2021-12-10 ENCOUNTER — Ambulatory Visit (INDEPENDENT_AMBULATORY_CARE_PROVIDER_SITE_OTHER): Payer: BC Managed Care – PPO

## 2021-12-10 DIAGNOSIS — I480 Paroxysmal atrial fibrillation: Secondary | ICD-10-CM | POA: Diagnosis not present

## 2021-12-11 LAB — CUP PACEART REMOTE DEVICE CHECK
Battery Remaining Longevity: 79 mo
Battery Remaining Percentage: 80 %
Battery Voltage: 2.99 V
Brady Statistic AP VP Percent: 53 %
Brady Statistic AP VS Percent: 1 %
Brady Statistic AS VP Percent: 46 %
Brady Statistic AS VS Percent: 1 %
Brady Statistic RA Percent Paced: 51 %
Brady Statistic RV Percent Paced: 99 %
Date Time Interrogation Session: 20230725192900
Implantable Lead Implant Date: 20211025
Implantable Lead Implant Date: 20211025
Implantable Lead Location: 753859
Implantable Lead Location: 753860
Implantable Pulse Generator Implant Date: 20211025
Lead Channel Impedance Value: 300 Ohm
Lead Channel Impedance Value: 460 Ohm
Lead Channel Pacing Threshold Amplitude: 0.75 V
Lead Channel Pacing Threshold Amplitude: 1.5 V
Lead Channel Pacing Threshold Pulse Width: 0.4 ms
Lead Channel Pacing Threshold Pulse Width: 0.6 ms
Lead Channel Sensing Intrinsic Amplitude: 10.6 mV
Lead Channel Sensing Intrinsic Amplitude: 2.7 mV
Lead Channel Setting Pacing Amplitude: 1 V
Lead Channel Setting Pacing Amplitude: 2.5 V
Lead Channel Setting Pacing Pulse Width: 0.4 ms
Lead Channel Setting Sensing Sensitivity: 2.5 mV
Pulse Gen Model: 2272
Pulse Gen Serial Number: 3873891

## 2022-01-06 NOTE — Progress Notes (Signed)
Remote pacemaker transmission.   

## 2022-01-07 ENCOUNTER — Inpatient Hospital Stay: Payer: BC Managed Care – PPO | Attending: Hematology

## 2022-01-07 DIAGNOSIS — D649 Anemia, unspecified: Secondary | ICD-10-CM | POA: Insufficient documentation

## 2022-01-07 DIAGNOSIS — Z800 Family history of malignant neoplasm of digestive organs: Secondary | ICD-10-CM | POA: Insufficient documentation

## 2022-01-07 DIAGNOSIS — C8590 Non-Hodgkin lymphoma, unspecified, unspecified site: Secondary | ICD-10-CM

## 2022-01-07 DIAGNOSIS — Z87891 Personal history of nicotine dependence: Secondary | ICD-10-CM | POA: Diagnosis not present

## 2022-01-07 DIAGNOSIS — I890 Lymphedema, not elsewhere classified: Secondary | ICD-10-CM | POA: Insufficient documentation

## 2022-01-07 DIAGNOSIS — Z8572 Personal history of non-Hodgkin lymphomas: Secondary | ICD-10-CM | POA: Diagnosis not present

## 2022-01-07 DIAGNOSIS — D6862 Lupus anticoagulant syndrome: Secondary | ICD-10-CM | POA: Diagnosis not present

## 2022-01-07 DIAGNOSIS — Z86718 Personal history of other venous thrombosis and embolism: Secondary | ICD-10-CM | POA: Insufficient documentation

## 2022-01-07 DIAGNOSIS — D696 Thrombocytopenia, unspecified: Secondary | ICD-10-CM | POA: Diagnosis not present

## 2022-01-07 LAB — CBC WITH DIFFERENTIAL/PLATELET
Abs Immature Granulocytes: 0.01 10*3/uL (ref 0.00–0.07)
Basophils Absolute: 0 10*3/uL (ref 0.0–0.1)
Basophils Relative: 1 %
Eosinophils Absolute: 0.1 10*3/uL (ref 0.0–0.5)
Eosinophils Relative: 3 %
HCT: 33.3 % — ABNORMAL LOW (ref 39.0–52.0)
Hemoglobin: 11.8 g/dL — ABNORMAL LOW (ref 13.0–17.0)
Immature Granulocytes: 0 %
Lymphocytes Relative: 29 %
Lymphs Abs: 1.3 10*3/uL (ref 0.7–4.0)
MCH: 34 pg (ref 26.0–34.0)
MCHC: 35.4 g/dL (ref 30.0–36.0)
MCV: 96 fL (ref 80.0–100.0)
Monocytes Absolute: 0.4 10*3/uL (ref 0.1–1.0)
Monocytes Relative: 10 %
Neutro Abs: 2.7 10*3/uL (ref 1.7–7.7)
Neutrophils Relative %: 57 %
Platelets: 105 10*3/uL — ABNORMAL LOW (ref 150–400)
RBC: 3.47 MIL/uL — ABNORMAL LOW (ref 4.22–5.81)
RDW: 11.9 % (ref 11.5–15.5)
WBC: 4.6 10*3/uL (ref 4.0–10.5)
nRBC: 0 % (ref 0.0–0.2)

## 2022-01-07 LAB — IRON AND TIBC
Iron: 116 ug/dL (ref 45–182)
Saturation Ratios: 46 % — ABNORMAL HIGH (ref 17.9–39.5)
TIBC: 250 ug/dL (ref 250–450)
UIBC: 134 ug/dL

## 2022-01-07 LAB — VITAMIN B12: Vitamin B-12: 598 pg/mL (ref 180–914)

## 2022-01-07 LAB — FERRITIN: Ferritin: 169 ng/mL (ref 24–336)

## 2022-01-07 LAB — LACTATE DEHYDROGENASE: LDH: 169 U/L (ref 98–192)

## 2022-01-07 LAB — FOLATE: Folate: 27 ng/mL (ref >5.9)

## 2022-01-12 LAB — METHYLMALONIC ACID, SERUM: Methylmalonic Acid, Quantitative: 243 nmol/L (ref 0–378)

## 2022-01-14 ENCOUNTER — Encounter: Payer: Self-pay | Admitting: Hematology

## 2022-01-14 ENCOUNTER — Inpatient Hospital Stay (HOSPITAL_BASED_OUTPATIENT_CLINIC_OR_DEPARTMENT_OTHER): Payer: BC Managed Care – PPO | Admitting: Hematology

## 2022-01-14 VITALS — BP 135/71 | HR 70 | Temp 98.5°F | Resp 18 | Wt 228.2 lb

## 2022-01-14 DIAGNOSIS — D649 Anemia, unspecified: Secondary | ICD-10-CM | POA: Diagnosis not present

## 2022-01-14 DIAGNOSIS — C8590 Non-Hodgkin lymphoma, unspecified, unspecified site: Secondary | ICD-10-CM

## 2022-01-14 DIAGNOSIS — Z8572 Personal history of non-Hodgkin lymphomas: Secondary | ICD-10-CM | POA: Diagnosis not present

## 2022-01-14 NOTE — Progress Notes (Signed)
[address], [place] [address]   CLINIC:  Medical Oncology/Hematology  PCP:  Celene Squibb, MD [address] Liana Crocker [place] [address] [phone]   REASON FOR VISIT:  Follow-up for NHL and normocytic anemia  PRIOR THERAPY: none  NGS Results: not done  CURRENT THERAPY: under work-up  BRIEF ONCOLOGIC HISTORY:  Oncology History   No history exists.    CANCER STAGING:  Cancer Staging  No matching staging information was found for the patient.  INTERVAL HISTORY:  [name], a 66 y.o. male, returns for follow-up of non-Hodgkin's lymphoma normocytic anemia.  Reports taking iron tablet and multivitamin daily.  Complains of right leg lymphedema which has been stable.  But during the summer months the swelling has been worse and caused him pain.  REVIEW OF SYSTEMS:  Review of Systems  Constitutional:  Negative for appetite change, fatigue, fever and unexpected weight change.  Cardiovascular:  Positive for leg swelling (R leg). Negative for chest pain (resolved).  Gastrointestinal:  Negative for blood in stool and vomiting.  Endocrine: Negative for hot flashes.  Genitourinary:  Negative for hematuria.   Neurological:  Positive for headaches and numbness (Hands and feet stable).  Hematological:  Does not bruise/bleed easily.  Psychiatric/Behavioral:  Positive for sleep disturbance.   All other systems reviewed and are negative.   PAST MEDICAL/SURGICAL HISTORY:  Past Medical History:  Diagnosis Date   Anxiety    Arthritis    Chronic pain    Cirrhosis ([facility])    Coronary atherosclerosis of native coronary artery    BMS LAD and PTCA RCA 2000,BMS RCA/PLA 2001, CABG June 2018   Diverticulitis    Essential hypertension    Fatty liver    Gastric ulcer    GERD (gastroesophageal reflux disease)    Hyperlipidemia    Low-grade NHL (non-Hodgkin's lymphoma) 2007   Lupus anticoagulant positive    MI (myocardial infarction) ([facility])  2001   Pacemaker    Paresthesia of upper limb    Peripheral neuropathy    Thrombocytopenia ([facility])    Trifascicular block    Type 2 diabetes mellitus ([facility])    Umbilical hernia    Past Surgical History:  Procedure Laterality Date   Axillary abscess     left incision and drainage left   BACK SURGERY     multiple   BIOPSY  08/15/2014   Procedure: BIOPSY;  Surgeon: Danie Binder, MD;  Location: [facility];  Service: Endoscopy;;  Gastric   BIOPSY  01/19/2015   Procedure: BIOPSY (GASTRIC ULCER);  Surgeon: Danie Binder, MD;  Location: [facility];  Service: Endoscopy;;   CARDIAC CATHETERIZATION     cardiac stents     x5 stents   COLONOSCOPY  June 2008   Dr. Dellis Filbert Medoff: Mild left colonic diverticulosis   CORONARY ARTERY BYPASS GRAFT N/A 11/11/2016   Procedure: CORONARY ARTERY BYPASS GRAFTING (CABG) x 3, USING LEFT MAMMARY ARTERY AND RIGHT GREATER SAPHENOUS VEIN HARVESTED ENDOSCOPICALLY;  Surgeon: Ivin Poot, MD;  Location: [facility];  Service: Open Heart Surgery;  Laterality: N/A;   Dental extractions     ESOPHAGOGASTRODUODENOSCOPY  June 2008   Dr. Dellis Filbert Medoff: Gastric ulcer, antral, biopsy with reactive changes associated glandular atrophy, no H pylori. No features of lymphoma. Likely NSAID related   ESOPHAGOGASTRODUODENOSCOPY  August 2008   Dr. Dellis Filbert Medoff: Complete healing of gastric ulcers.   ESOPHAGOGASTRODUODENOSCOPY (EGD) WITH PROPOFOL N/A 08/15/2014   SLF:  1. Abnormal pain due to ulcers & gastriitis    ESOPHAGOGASTRODUODENOSCOPY (EGD) WITH PROPOFOL N/A 01/19/2015   SLF: 1. Persistant ulcer with firm base 2. single gastric polyp found in the gastric antrum. Ulcer base    EUS N/A 03/21/2015   Thickened gastric wall in pre-pyloric antrum, no obvious intramural mass, fatty pancreas, no obvious pancreatic mass.    Inguinal lymph node biopsy     right inguinal lymph node biopsy   INTRAVASCULAR PRESSURE WIRE/FFR STUDY N/A 09/26/2016   Procedure: Intravascular Pressure Wire/FFR Study;   Surgeon: Nelva Bush, MD;  Location: [facility];  Service: Cardiovascular;  Laterality: N/A;   LEFT HEART CATH AND CORONARY ANGIOGRAPHY N/A 09/26/2016   Procedure: Left Heart Cath and Coronary Angiography;  Surgeon: Nelva Bush, MD;  Location: [facility];  Service: Cardiovascular;  Laterality: N/A;   LEFT HEART CATH AND CORS/GRAFTS ANGIOGRAPHY N/A 06/29/2020   Procedure: LEFT HEART CATH AND CORS/GRAFTS ANGIOGRAPHY;  Surgeon: Sherren Mocha, MD;  Location: [facility];  Service: Cardiovascular;  Laterality: N/A;   NECK SURGERY     PACEMAKER IMPLANT N/A 03/12/2020   Procedure: PACEMAKER IMPLANT;  Surgeon: Vickie Epley, MD;  Location: [facility];  Service: Cardiovascular;  Laterality: N/A;   POLYPECTOMY  01/19/2015   Procedure: POLYPECTOMY (GASTRIC);  Surgeon: Danie Binder, MD;  Location: [facility];  Service: Endoscopy;;   ROUX-EN-Y GASTRIC BYPASS     RNY gastric bypass   SHOULDER ARTHROSCOPY Left    TEE WITHOUT CARDIOVERSION N/A 11/11/2016   Procedure: TRANSESOPHAGEAL ECHOCARDIOGRAM (TEE);  Surgeon: Prescott Gum, Collier Salina, MD;  Location: [facility];  Service: Open Heart Surgery;  Laterality: N/A;    SOCIAL HISTORY:  Social History   Socioeconomic History   Marital status: Married    Spouse name: Not on file   Number of children: Not on file   Years of education: Not on file   Highest education level: Not on file  Occupational History   Not on file  Tobacco Use   Smoking status: Former    Packs/day: 1.00    Years: 23.00    Total pack years: 23.00    Types: Cigarettes    Start date: 10/30/1970    Quit date: 10/29/1993    Years since quitting: 28.2   Smokeless tobacco: Former    Types: Snuff, Chew  Vaping Use   Vaping Use: Never used  Substance and Sexual Activity   Alcohol use: No    Alcohol/week: 0.0 standard drinks of alcohol   Drug use: No   Sexual activity: Never    Birth control/protection: None  Other Topics Concern   Not on file  Social  History Narrative   Not on file   Social Determinants of Health   Financial Resource Strain: High Risk (10/17/2020)   Overall Financial Resource Strain (CARDIA)    Difficulty of Paying Living Expenses: Hard  Food Insecurity: No Food Insecurity (10/17/2020)   Hunger Vital Sign    Worried About Running Out of Food in the Last Year: Never true    [place] in the Last Year: Never true  Transportation Needs: No Transportation Needs (10/17/2020)   PRAPARE - Hydrologist (Medical): No    Lack of Transportation (Non-Medical): No  Physical Activity: Sufficiently Active (10/17/2020)   Exercise Vital Sign    Days of Exercise per Week: 7 days    Minutes of Exercise per Session: 60 min  Stress:  Stress Concern Present (10/17/2020)   [place]    Feeling of Stress : Very much  Social Connections: Socially Integrated (10/17/2020)   Social Connection and Isolation Panel [NHANES]    Frequency of Communication with Friends and Family: More than three times a week    Frequency of Social Gatherings with Friends and Family: Never    Attends Religious Services: More than 4 times per year    Active Member of [employer] or Organizations: Yes    Attends Music therapist: More than 4 times per year    Marital Status: Married  Human resources officer Violence: Not At Risk (10/17/2020)   Humiliation, Afraid, Rape, and Kick questionnaire    Fear of Current or Ex-Partner: No    Emotionally Abused: No    Physically Abused: No    Sexually Abused: No    FAMILY HISTORY:  Family History  Problem Relation Age of Onset   Pancreatic cancer Maternal Grandmother    Obesity Mother    Hypertension Mother    Diabetes Father    Thyroid disease Father    Heart disease Father    Colon cancer Neg Hx     CURRENT MEDICATIONS:  Current Outpatient Medications  Medication Sig Dispense Refill   acetaminophen (TYLENOL) 500 MG  tablet Take 500-1,000 mg by mouth every 6 (six) hours as needed (for pain.).     amLODipine (NORVASC) 2.5 MG tablet TAKE 1 TABLET(2.5 MG) BY MOUTH DAILY 90 tablet 3   Ascorbic Acid (VITAMIN C) 500 MG CAPS Take 500 mg by mouth daily.     aspirin EC 81 MG tablet Take 1 tablet (81 mg total) by mouth daily. 30 tablet 11   B Complex-C (B-COMPLEX WITH VITAMIN C) tablet Take 1 tablet by mouth daily.     Cholecalciferol (VITAMIN D3) 50 MCG (2000 UT) TABS Take 2,000 Units by mouth daily.     Continuous Blood Gluc Sensor (FREESTYLE LIBRE SENSOR SYSTEM) MISC Use one sensor every 10 days. 3 each 2   ferrous sulfate 325 (65 FE) MG tablet Take 325 mg by mouth daily.     FLUoxetine (PROZAC) 20 MG capsule Take 20 mg by mouth daily.     gabapentin (NEURONTIN) 600 MG tablet Take 600 mg by mouth at bedtime.     glucose blood (ONETOUCH VERIO) test strip USE AS DIRECTED FOUR- FIVE TIMES DAILY 450 each 1   Insulin Glargine (BASAGLAR KWIKPEN) 100 UNIT/ML SOPN INJECT 80 UNITS INTO SKIN DAILY AT 10 PM 30 mL 2   isosorbide mononitrate (IMDUR) 30 MG 24 hr tablet Take 1 tablet (30 mg total) by mouth 2 (two) times daily. 180 tablet 3   Magnesium 250 MG TABS Take by mouth.     Multiple Vitamin (MULTIVITAMIN WITH MINERALS) TABS tablet Take 1 tablet by mouth in the morning and at bedtime.     nitroGLYCERIN (NITROSTAT) 0.4 MG SL tablet Place 1 tablet (0.4 mg total) under the tongue every 5 (five) minutes x 3 doses as needed for chest pain. 25 tablet 3   ONETOUCH VERIO test strip USE TO TEST FOUR TIMES DAILY AS DIRECTED 450 each 2   oxyCODONE-acetaminophen (PERCOCET/ROXICET) 5-325 MG tablet Take 1 tablet by mouth every 4 (four) hours as needed for severe pain.     pantoprazole (PROTONIX) 40 MG tablet TAKE 1 TABLET BY MOUTH DAILY BEFORE BREAKFAST 90 tablet 0   Pediatric Multivitamins-Iron (FLINTSTONES COMPLETE PO) Take 1 tablet by mouth daily.  simvastatin (ZOCOR) 10 MG tablet TAKE 1 TABLET BY MOUTH EVERY NIGHT AT BEDTIME 30  tablet 2   Sodium Bicarbonate-Citric Acid (ALKA-SELTZER HEARTBURN) 1940-1000 MG TBEF Take 1-2 tablets by mouth 2 (two) times daily as needed (heartburn/indigestion).     warfarin (COUMADIN) 5 MG tablet TAKE 1 AND 1/2 TABLETS BY MOUTH ON SUNDAY, TUES, WEDNESDAY, FRIDAY, AND SATURDAY. TAKE 1 TABLET ON MONDAYS AND THURSDAYS OR AS DIRECTED 45 tablet 5   No current facility-administered medications for this visit.    ALLERGIES:  No Known Allergies  PHYSICAL EXAM:  Performance status (ECOG): 1 - Symptomatic but completely ambulatory  Vitals:   01/14/22 1503  BP: 135/71  Pulse: 70  Resp: 18  Temp: 98.5 F (36.9 C)  SpO2: 100%   Wt Readings from Last 3 Encounters:  01/14/22 228 lb 2.8 oz (103.5 kg)  11/11/21 231 lb 9.6 oz (105.1 kg)  10/18/21 220 lb (99.8 kg)   Physical Exam Vitals reviewed.  Constitutional:      Appearance: Normal appearance.  Cardiovascular:     Rate and Rhythm: Normal rate and regular rhythm.     Pulses: Normal pulses.     Heart sounds: Normal heart sounds.  Pulmonary:     Effort: Pulmonary effort is normal.     Breath sounds: Normal breath sounds.  Abdominal:     Palpations: Abdomen is soft. There is no hepatomegaly, splenomegaly or mass.     Tenderness: There is no abdominal tenderness.  Musculoskeletal:     Right lower leg: Edema (lymphedema) present.     Left lower leg: No edema.  Lymphadenopathy:     Cervical: No cervical adenopathy.     Right cervical: No superficial cervical adenopathy.    Left cervical: No superficial cervical adenopathy.     Upper Body:     Right upper body: No supraclavicular, axillary or pectoral adenopathy.     Left upper body: No supraclavicular, axillary or pectoral adenopathy.     Lower Body: No right inguinal adenopathy. No left inguinal adenopathy.  Neurological:     General: No focal deficit present.     Mental Status: He is alert and oriented to person, place, and time.  Psychiatric:        Mood and Affect: Mood  normal.        Behavior: Behavior normal.      LABORATORY DATA:  I have reviewed the labs as listed.     Latest Ref Rng & Units 01/07/2022    3:45 PM 07/03/2021    3:35 PM 10/17/2020    8:46 AM  CBC  WBC 4.0 - 10.5 K/uL 4.6  5.6  5.5   Hemoglobin 13.0 - 17.0 g/dL 11.8  12.1  11.9   Hematocrit 39.0 - 52.0 % 33.3  35.4  35.6   Platelets 150 - 400 K/uL 105  132  118       Latest Ref Rng & Units 07/03/2021    3:35 PM 06/18/2020    3:26 PM 03/28/2020   10:28 AM  CMP  Glucose 70 - 99 mg/dL 211  189  180   BUN 8 - 23 mg/dL '19  16  18   '$ Creatinine 0.61 - 1.24 mg/dL 1.01  0.79  0.92   Sodium 135 - 145 mmol/L 136  141  137   Potassium 3.5 - 5.1 mmol/L 4.3  4.1  4.2   Chloride 98 - 111 mmol/L 103  105  100   CO2 22 - 32 mmol/L  $'24  23  29   'N$ Calcium 8.9 - 10.3 mg/dL 8.7  8.8  9.0   Total Protein 6.5 - 8.1 g/dL 7.0     Total Bilirubin 0.3 - 1.2 mg/dL 0.4     Alkaline Phos 38 - 126 U/L 93     AST 15 - 41 U/L 25     ALT 0 - 44 U/L 21       DIAGNOSTIC IMAGING:  I have independently reviewed the scans and discussed with the patient. No results found.   ASSESSMENT:  1.  Low-grade follicular lymphoma: - Right inguinal lymph node excision biopsy on 09/15/2005 consistent with grade 2 follicular lymphoma, mixed cell type.  Cells are positive for Bcl-2, CD20 and CD79a.  CD10 is negative. - He was under watchful observation and did not require any treatment over the years. - Last CT CAP on 05/29/2017 at [facility] showed enlarged mediastinal nodes, largest 1.7 x 2.4 cm right infrahilar node.  Prominent but not enlarged abdominal retroperitoneal nodes.  Right middle lobe 7 mm nodule.  Fatty cirrhotic liver.  Normal spleen.   2.  Social/family history: - He works as a Water engineer at [employer].  He quit smoking in 1995.  He smoked 1 pack/day for 20+ years. - Paternal grandmother had pancreatic cancer.  Maternal grandmother died of CVA in her 31s.  Father was on blood thinners.   3.   Normocytic anemia: - Last colonoscopy was in 2008 which showed normal colon with diverticulosis on the left side. - EGD on 03/21/2015 showed nodularity and ulcerations and atrophy seen in prepyloric antrum.  EUS showed thickened gastric wall in the prepyloric antrum with no mass.  4.  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  No history of pulmonary embolism.    PLAN:  1.  Low-grade follicular lymphoma: -He does not have any B symptoms.  No infections in the last 6 months. - No lymphadenopathy or splenomegaly palpable. - Reviewed labs which showed normal LDH.  White count is normal. - Return to clinic in 1 year for follow-up. - He has right leg lymphedema for the last few years secondary to right groin lymph node biopsy and bypass surgery.  I have recommended consultation with lymphedema clinic for a pump.   2.  Normocytic anemia: - Mild normocytic anemia is stable. - Hemoglobin is 11.8.  Ferritin 169 and percent saturation 46.  J88, folic acid and MMA are normal.  Continue iron tablet daily and multivitamin daily.   3.  Mild thrombocytopenia: - Mild thrombocytopenia since 2008 with no bleeding issues.  Likely immune related.   4.  Positive lupus anticoagulant with recurrent DVTs/A-fib: - Continue Coumadin indefinitely.  No bleeding issues reported.   Orders placed this encounter:  Orders Placed This Encounter  Procedures   CBC with Differential/Platelet   Comprehensive metabolic panel   Lactate dehydrogenase   Ferritin   Iron and TIBC   Vitamin B12   Folate      Derek Jack, MD [facility] [place] [phone]

## 2022-01-14 NOTE — Patient Instructions (Addendum)
[facility]  Discharge Instructions  You were seen and examined today by Dr. Delton Coombes.  Dr. Delton Coombes discussed your most recent lab work which revealed everything looks good and stable.  Continue taking Iron, Vitamin B12 and multivitamins.   If you have drenching night sweats, pneumonia or several infections please call our office for sooner appointment.  Follow-up as scheduled in 1 year.    Thank you for choosing [facility] to provide your oncology and hematology care.   To afford each patient quality time with our provider, please arrive at least 15 minutes before your scheduled appointment time. You may need to reschedule your appointment if you arrive late (10 or more minutes). Arriving late affects you and other patients whose appointments are after yours.  Also, if you miss three or more appointments without notifying the office, you may be dismissed from the clinic at the provider's discretion.    Again, thank you for choosing [facility].  Our hope is that these requests will decrease the amount of time that you wait before being seen by our physicians.   If you have a lab appointment with the [facility] please come in thru the Main Entrance and check in at the main information desk.           _____________________________________________________________  Should you have questions after your visit to [facility], please contact our office at [phone] and follow the prompts.  Our office hours are 8:00 a.m. to 4:30 p.m. Monday - Thursday and 8:00 a.m. to 2:30 p.m. Friday.  Please note that voicemails left after 4:00 p.m. may not be returned until the following business day.  We are closed weekends and all major holidays.  You do have access to a nurse 24-7, just call the main number to the clinic [phone] and do not press any options, hold on the line and a nurse will answer the phone.     For prescription refill requests, have your pharmacy contact our office and allow 72 hours.

## 2022-01-21 ENCOUNTER — Ambulatory Visit: Payer: BC Managed Care – PPO | Attending: Cardiology | Admitting: *Deleted

## 2022-01-21 DIAGNOSIS — I480 Paroxysmal atrial fibrillation: Secondary | ICD-10-CM | POA: Diagnosis not present

## 2022-01-21 DIAGNOSIS — D6869 Other thrombophilia: Secondary | ICD-10-CM

## 2022-01-21 DIAGNOSIS — Z5181 Encounter for therapeutic drug level monitoring: Secondary | ICD-10-CM | POA: Diagnosis not present

## 2022-01-21 LAB — POCT INR: INR: 2.8 (ref 2.0–3.0)

## 2022-01-21 NOTE — Patient Instructions (Signed)
-  Warfarin '5mg'$  tablet -Continue warfarin 1 1/2 tablets daily except 1 tablet on Mondays and Thursdays -Recheck in office in 7 wks

## 2022-02-25 IMAGING — CR DG CHEST 2V
2 series · 2 of 2 positions shown · non-contrast
Comparison: February 10, 2020

CLINICAL DATA: Status post pacemaker insertion

EXAM:
CHEST - 2 VIEW

[w chest pa]
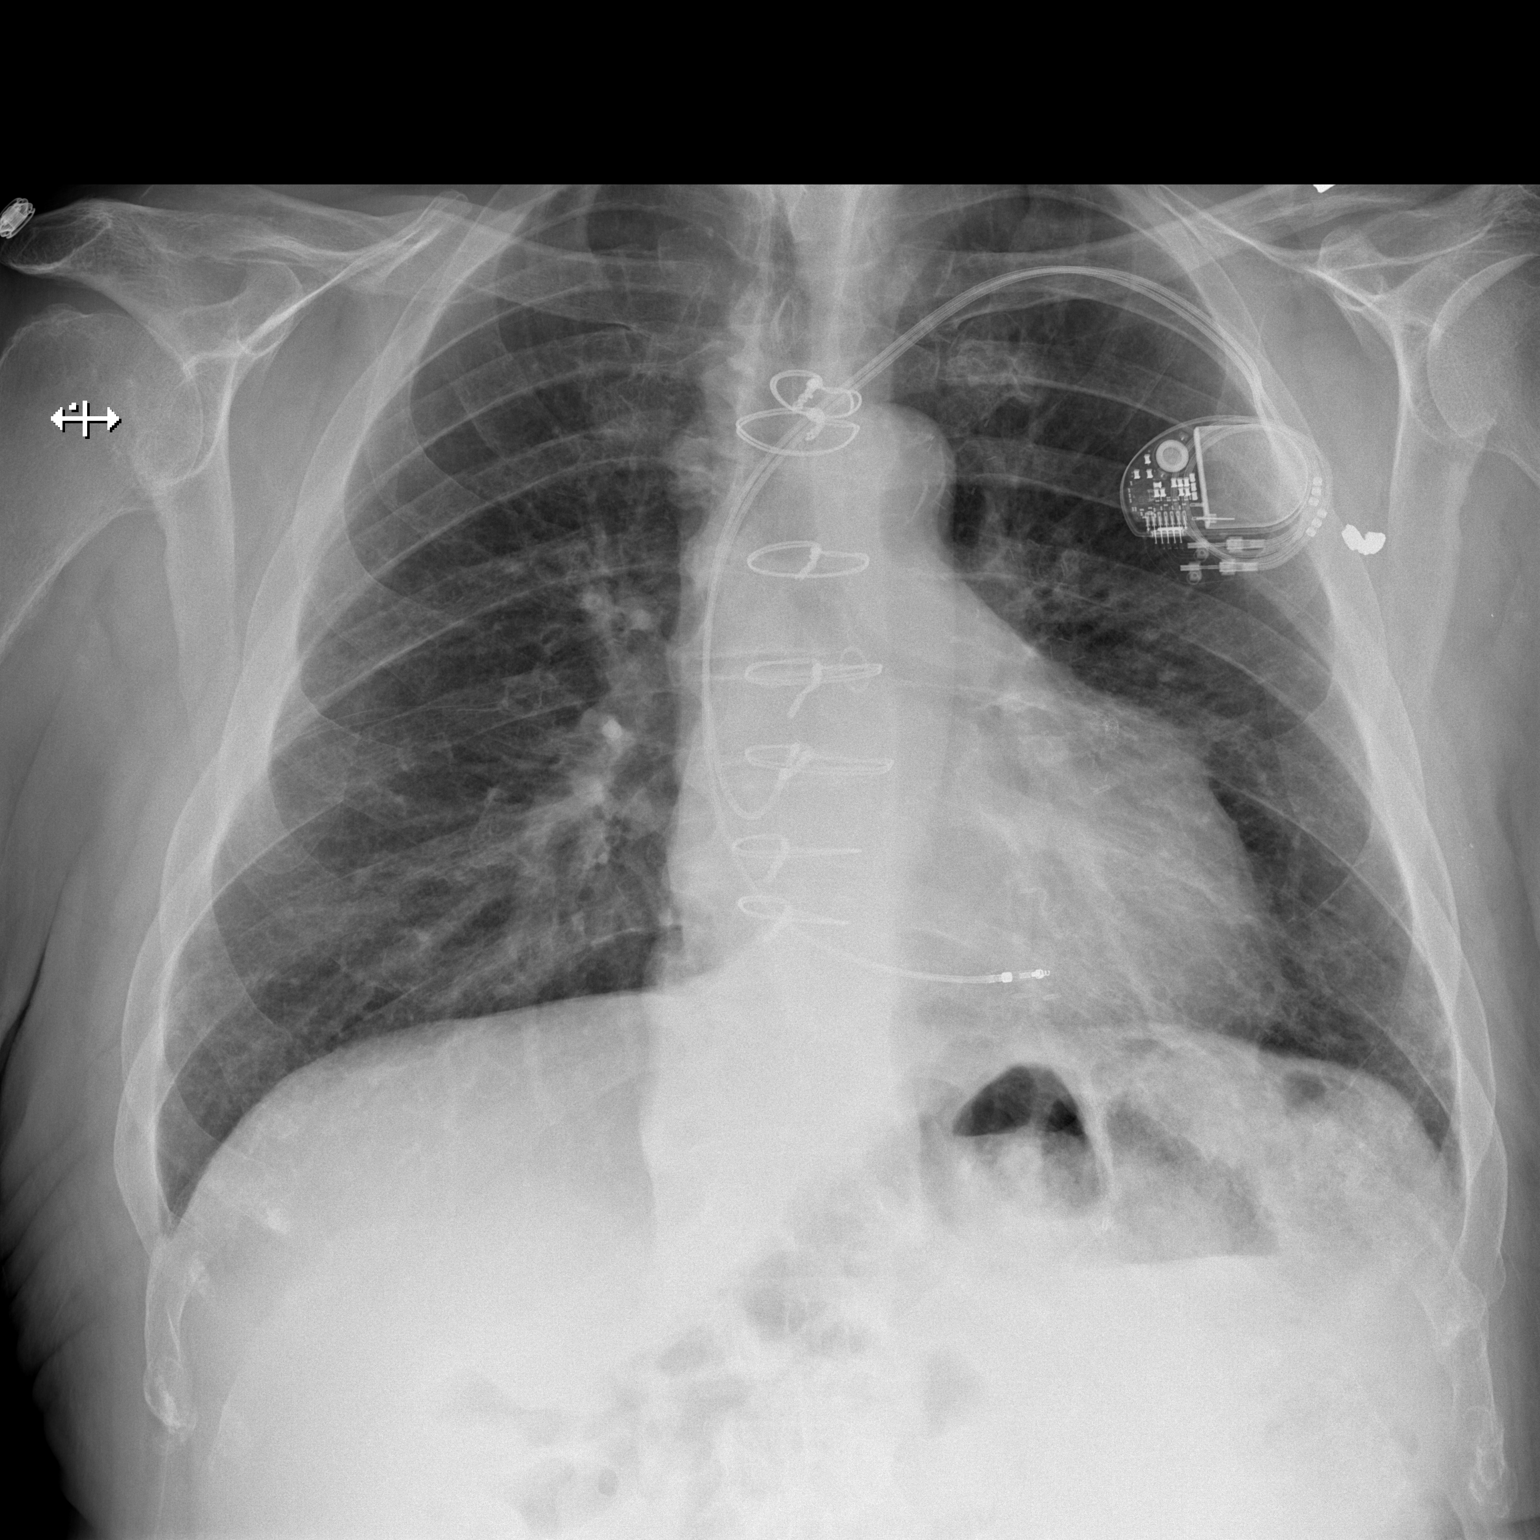

[w chest lat]
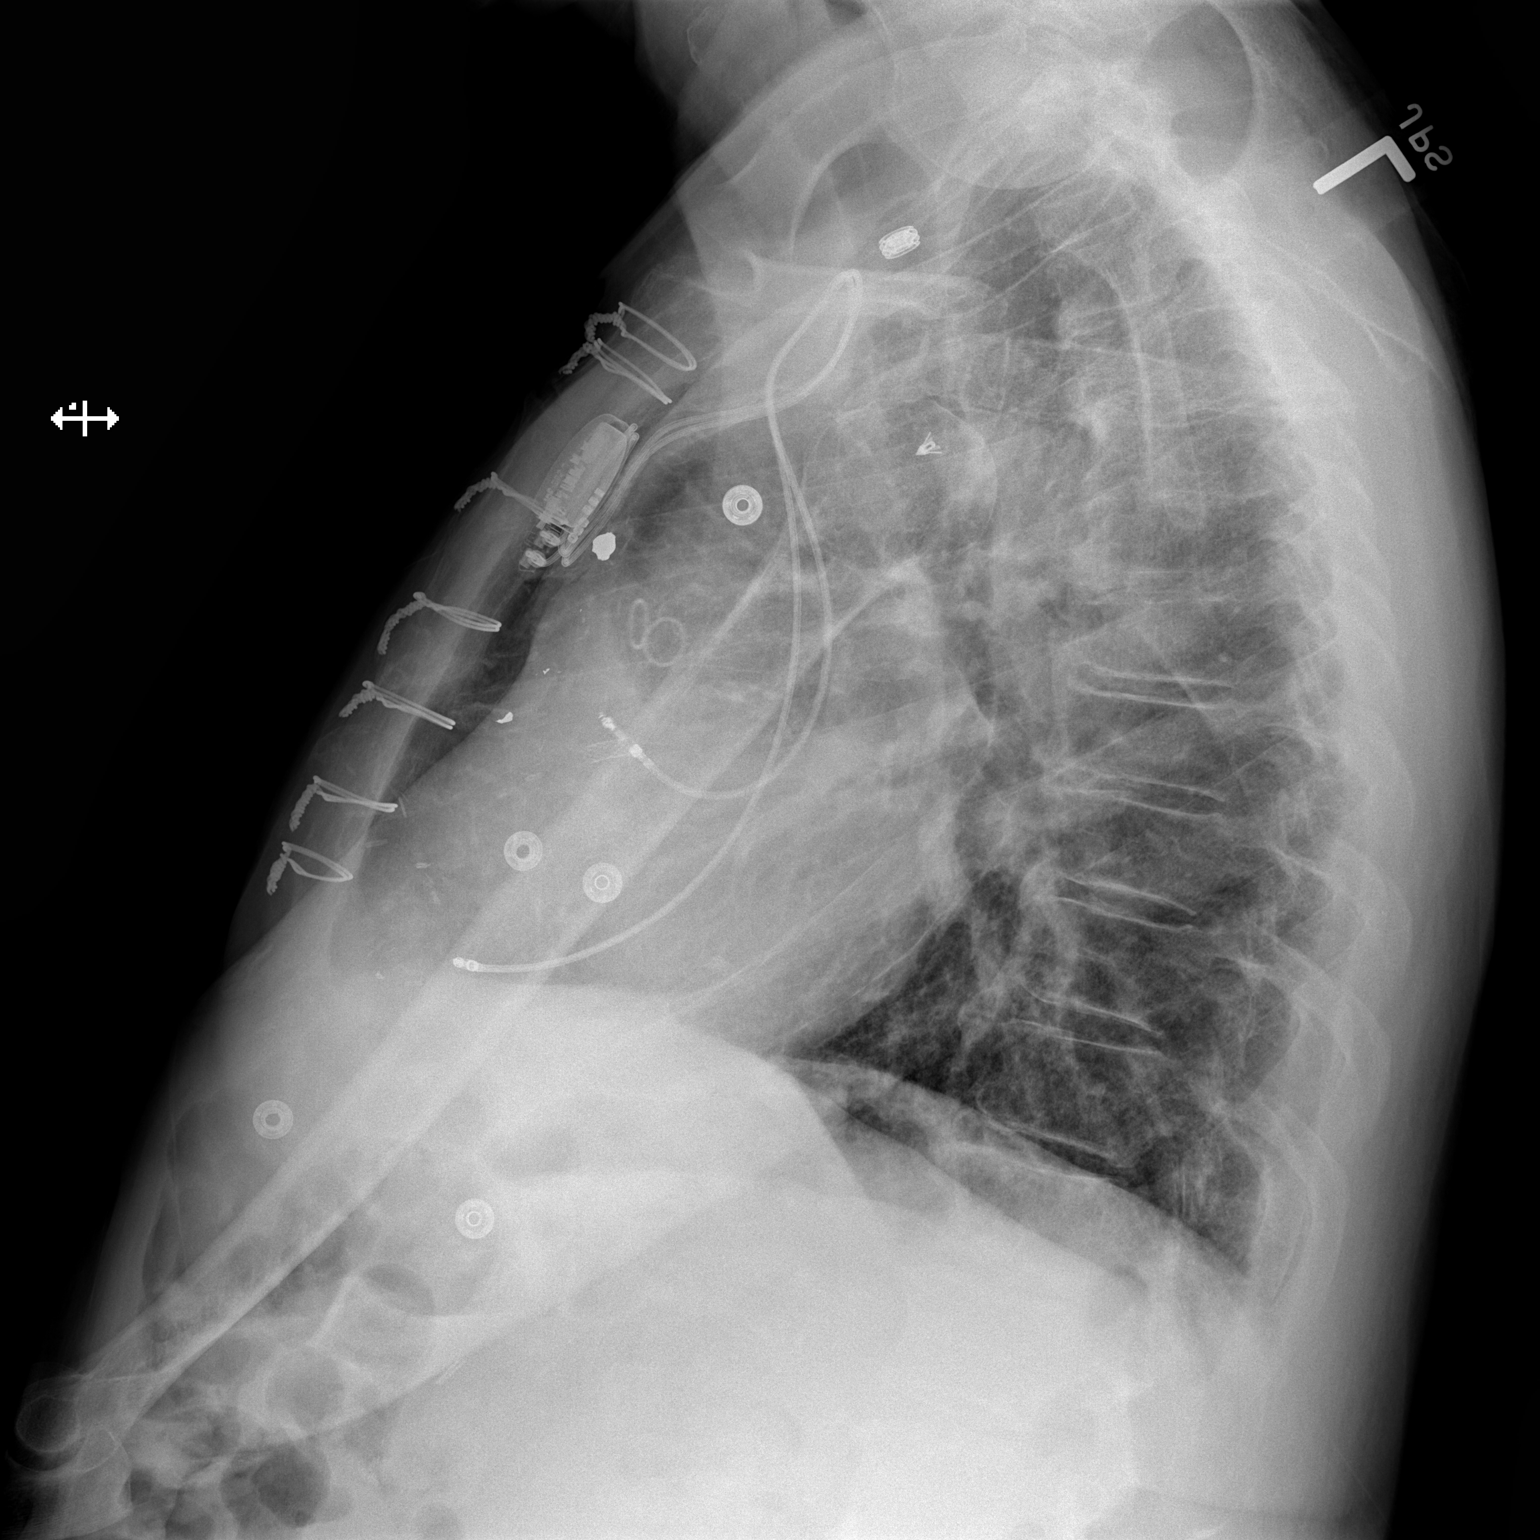

[2 of 2 positions shown; findings below may reference images not displayed]

FINDINGS: Pacemaker present on the left. Lead tips are attached to the right
atrium and right ventricle. No pneumothorax. No edema or airspace
opacity. Heart size and pulmonary vascularity within normal limits.
There is aortic atherosclerosis. Patient is status post coronary
artery bypass grafting. There is an old healed fracture of the right
clavicle. Several metallic foreign bodies noted in the left
hemithorax.
IMPRESSION: No edema or airspace opacity. Pacemaker leads attached to right
atrium right ventricle. Heart size normal. No pneumothorax. Status
post coronary artery bypass grafting.

Aortic Atherosclerosis ([id number].G).

## 2022-03-11 ENCOUNTER — Ambulatory Visit (INDEPENDENT_AMBULATORY_CARE_PROVIDER_SITE_OTHER): Payer: BC Managed Care – PPO

## 2022-03-11 ENCOUNTER — Ambulatory Visit: Payer: BC Managed Care – PPO | Attending: Cardiology | Admitting: *Deleted

## 2022-03-11 ENCOUNTER — Other Ambulatory Visit: Payer: Self-pay | Admitting: Cardiology

## 2022-03-11 ENCOUNTER — Other Ambulatory Visit: Payer: Self-pay | Admitting: *Deleted

## 2022-03-11 DIAGNOSIS — D6869 Other thrombophilia: Secondary | ICD-10-CM

## 2022-03-11 DIAGNOSIS — Z5181 Encounter for therapeutic drug level monitoring: Secondary | ICD-10-CM

## 2022-03-11 DIAGNOSIS — I480 Paroxysmal atrial fibrillation: Secondary | ICD-10-CM

## 2022-03-11 DIAGNOSIS — I453 Trifascicular block: Secondary | ICD-10-CM | POA: Diagnosis not present

## 2022-03-11 LAB — POCT INR: INR: 3.7 — AB (ref 2.0–3.0)

## 2022-03-11 MED ORDER — AMLODIPINE BESYLATE 2.5 MG PO TABS: 2.5000 mg | ORAL_TABLET | Freq: Every day | ORAL | 1 refills | Status: DC

## 2022-03-11 NOTE — Patient Instructions (Signed)
-  Warfarin '5mg'$  tablet -Hold warfarin tonight then resume 1 1/2 tablets daily except 1 tablet on Mondays and Thursdays -Recheck in office in 3 wks

## 2022-03-11 NOTE — Telephone Encounter (Signed)
Refill request for warfarin:  Last INR was 2.8 on 01/21/22 Next INR due 03/11/22 LOV was 11/11/21  Myles Gip MD  Refill approved.

## 2022-03-12 LAB — CUP PACEART REMOTE DEVICE CHECK
Battery Remaining Longevity: 76 mo
Battery Remaining Percentage: 77 %
Battery Voltage: 3.01 V
Brady Statistic AP VP Percent: 50 %
Brady Statistic AP VS Percent: 1 %
Brady Statistic AS VP Percent: 49 %
Brady Statistic AS VS Percent: 1 %
Brady Statistic RA Percent Paced: 49 %
Brady Statistic RV Percent Paced: 99 %
Date Time Interrogation Session: 20231024040014
Implantable Lead Connection Status: 753985
Implantable Lead Connection Status: 753985
Implantable Lead Implant Date: 20211025
Implantable Lead Implant Date: 20211025
Implantable Lead Location: 753859
Implantable Lead Location: 753860
Implantable Pulse Generator Implant Date: 20211025
Lead Channel Impedance Value: 300 Ohm
Lead Channel Impedance Value: 400 Ohm
Lead Channel Pacing Threshold Amplitude: 0.875 V
Lead Channel Pacing Threshold Amplitude: 1.5 V
Lead Channel Pacing Threshold Pulse Width: 0.4 ms
Lead Channel Pacing Threshold Pulse Width: 0.6 ms
Lead Channel Sensing Intrinsic Amplitude: 1.9 mV
Lead Channel Sensing Intrinsic Amplitude: 10.1 mV
Lead Channel Setting Pacing Amplitude: 1.125
Lead Channel Setting Pacing Amplitude: 2.5 V
Lead Channel Setting Pacing Pulse Width: 0.4 ms
Lead Channel Setting Sensing Sensitivity: 2.5 mV
Pulse Gen Model: 2272
Pulse Gen Serial Number: 3873891

## 2022-03-31 NOTE — Progress Notes (Signed)
Remote pacemaker transmission.   

## 2022-04-01 ENCOUNTER — Ambulatory Visit: Payer: BC Managed Care – PPO | Attending: Cardiology | Admitting: *Deleted

## 2022-04-01 DIAGNOSIS — D6869 Other thrombophilia: Secondary | ICD-10-CM | POA: Diagnosis not present

## 2022-04-01 DIAGNOSIS — I480 Paroxysmal atrial fibrillation: Secondary | ICD-10-CM

## 2022-04-01 DIAGNOSIS — Z5181 Encounter for therapeutic drug level monitoring: Secondary | ICD-10-CM | POA: Diagnosis not present

## 2022-04-01 LAB — POCT INR: INR: 2.7 (ref 2.0–3.0)

## 2022-04-01 NOTE — Patient Instructions (Signed)
-  Warfarin '5mg'$  tablet Continue warfarin 1 1/2 tablets daily except 1 tablet on Mondays and Thursdays -Recheck in office in 4 wks

## 2022-04-29 ENCOUNTER — Ambulatory Visit: Payer: BC Managed Care – PPO | Attending: Cardiology | Admitting: *Deleted

## 2022-04-29 DIAGNOSIS — D6869 Other thrombophilia: Secondary | ICD-10-CM

## 2022-04-29 DIAGNOSIS — Z5181 Encounter for therapeutic drug level monitoring: Secondary | ICD-10-CM | POA: Diagnosis not present

## 2022-04-29 DIAGNOSIS — I480 Paroxysmal atrial fibrillation: Secondary | ICD-10-CM | POA: Diagnosis not present

## 2022-04-29 LAB — POCT INR: INR: 2.7 (ref 2.0–3.0)

## 2022-04-29 NOTE — Patient Instructions (Signed)
-  Warfarin '5mg'$  tablet Continue warfarin 1 1/2 tablets daily except 1 tablet on Mondays and Thursdays -Recheck in office in 4 wks

## 2022-05-08 ENCOUNTER — Encounter: Payer: Self-pay | Admitting: Cardiology

## 2022-05-08 ENCOUNTER — Ambulatory Visit: Payer: BC Managed Care – PPO | Attending: Cardiology | Admitting: Cardiology

## 2022-05-08 VITALS — BP 128/64 | HR 60 | Ht 76.0 in | Wt 210.0 lb

## 2022-05-08 DIAGNOSIS — I480 Paroxysmal atrial fibrillation: Secondary | ICD-10-CM | POA: Diagnosis not present

## 2022-05-08 DIAGNOSIS — I350 Nonrheumatic aortic (valve) stenosis: Secondary | ICD-10-CM

## 2022-05-08 DIAGNOSIS — I25119 Atherosclerotic heart disease of native coronary artery with unspecified angina pectoris: Secondary | ICD-10-CM | POA: Diagnosis not present

## 2022-05-08 MED ORDER — ISOSORBIDE MONONITRATE ER 30 MG PO TB24: 60.0000 mg | ORAL_TABLET | Freq: Every day | ORAL | 3 refills | Status: DC

## 2022-05-08 NOTE — Progress Notes (Signed)
Cardiology Office Note  Date: 05/08/2022   [name], DOB [birth year], MRN [id number]  PCP:  Celene Squibb, MD  Cardiologist:  Rozann Lesches, MD Electrophysiologist:  Vickie Epley, MD   Chief Complaint  Patient presents with   Cardiac follow-up    History of Present Illness: Kelly [name] is a 66 y.o. male last seen in June.  He is here today with his wife for follow-up visit.  Reports a lot of stress at work, considering retirement when he turns 81 in June.  Has been having to use nitroglycerin in association with stress at times, otherwise reports compliance with his medications.  St. Jude dual-chamber pacemaker in place, follows with Dr. Quentin Ore.  He continues on Coumadin with follow-up in anticoagulation clinic.  Recent INR 2.7.  He does not report any spontaneous bleeding problems.  We went over his medications and discussed changing his Imdur to 60 mg in the morning rather than dividing the dose as most of his symptoms seem to occur during the daytime.  He is not on a beta-blocker with paced ventricular rhythm in the 60s and no obvious RVR with his atrial fibrillation.  Past Medical History:  Diagnosis Date   Anxiety    Arthritis    Chronic pain    Cirrhosis ([place])    Coronary atherosclerosis of native coronary artery    BMS LAD and PTCA RCA 2000,BMS RCA/PLA 2001, CABG June 2018   Diverticulitis    Essential hypertension    Fatty liver    Gastric ulcer    GERD (gastroesophageal reflux disease)    Hyperlipidemia    Low-grade NHL (non-Hodgkin's lymphoma) 2007   Lupus anticoagulant positive    MI (myocardial infarction) ([place]) 2001   Pacemaker    Paresthesia of upper limb    Peripheral neuropathy    Thrombocytopenia ([facility])    Trifascicular block    Type 2 diabetes mellitus ([place])    Umbilical hernia     Past Surgical History:  Procedure Laterality Date   Axillary abscess     left incision and drainage left   BACK SURGERY     multiple    BIOPSY  08/15/2014   Procedure: BIOPSY;  Surgeon: Danie Binder, MD;  Location: [facility];  Service: Endoscopy;;  Gastric   BIOPSY  01/19/2015   Procedure: BIOPSY (GASTRIC ULCER);  Surgeon: Danie Binder, MD;  Location: [facility];  Service: Endoscopy;;   CARDIAC CATHETERIZATION     cardiac stents     x5 stents   COLONOSCOPY  June 2008   Dr. Dellis Filbert Medoff: Mild left colonic diverticulosis   CORONARY ARTERY BYPASS GRAFT N/A 11/11/2016   Procedure: CORONARY ARTERY BYPASS GRAFTING (CABG) x 3, USING LEFT MAMMARY ARTERY AND RIGHT GREATER SAPHENOUS VEIN HARVESTED ENDOSCOPICALLY;  Surgeon: Ivin Poot, MD;  Location: [facility];  Service: Open Heart Surgery;  Laterality: N/A;   Dental extractions     ESOPHAGOGASTRODUODENOSCOPY  June 2008   Dr. Dellis Filbert Medoff: Gastric ulcer, antral, biopsy with reactive changes associated glandular atrophy, no H pylori. No features of lymphoma. Likely NSAID related   ESOPHAGOGASTRODUODENOSCOPY  August 2008   Dr. Dellis Filbert Medoff: Complete healing of gastric ulcers.   ESOPHAGOGASTRODUODENOSCOPY (EGD) WITH PROPOFOL N/A 08/15/2014   SLF: 1. Abnormal pain due to ulcers & gastriitis    ESOPHAGOGASTRODUODENOSCOPY (EGD) WITH PROPOFOL N/A 01/19/2015   SLF: 1. Persistant ulcer with firm base 2. single gastric polyp found in the gastric antrum. Ulcer base  EUS N/A 03/21/2015   Thickened gastric wall in pre-pyloric antrum, no obvious intramural mass, fatty pancreas, no obvious pancreatic mass.    Inguinal lymph node biopsy     right inguinal lymph node biopsy   INTRAVASCULAR PRESSURE WIRE/FFR STUDY N/A 09/26/2016   Procedure: Intravascular Pressure Wire/FFR Study;  Surgeon: Nelva Bush, MD;  Location: [facility];  Service: Cardiovascular;  Laterality: N/A;   LEFT HEART CATH AND CORONARY ANGIOGRAPHY N/A 09/26/2016   Procedure: Left Heart Cath and Coronary Angiography;  Surgeon: Nelva Bush, MD;  Location: [facility];  Service: Cardiovascular;  Laterality: N/A;    LEFT HEART CATH AND CORS/GRAFTS ANGIOGRAPHY N/A 06/29/2020   Procedure: LEFT HEART CATH AND CORS/GRAFTS ANGIOGRAPHY;  Surgeon: Sherren Mocha, MD;  Location: [facility];  Service: Cardiovascular;  Laterality: N/A;   NECK SURGERY     PACEMAKER IMPLANT N/A 03/12/2020   Procedure: PACEMAKER IMPLANT;  Surgeon: Vickie Epley, MD;  Location: [facility];  Service: Cardiovascular;  Laterality: N/A;   POLYPECTOMY  01/19/2015   Procedure: POLYPECTOMY (GASTRIC);  Surgeon: Danie Binder, MD;  Location: [facility];  Service: Endoscopy;;   ROUX-EN-Y GASTRIC BYPASS     RNY gastric bypass   SHOULDER ARTHROSCOPY Left    TEE WITHOUT CARDIOVERSION N/A 11/11/2016   Procedure: TRANSESOPHAGEAL ECHOCARDIOGRAM (TEE);  Surgeon: Prescott Gum, Collier Salina, MD;  Location: [facility];  Service: Open Heart Surgery;  Laterality: N/A;    Current Outpatient Medications  Medication Sig Dispense Refill   acetaminophen (TYLENOL) 500 MG tablet Take 500-1,000 mg by mouth every 6 (six) hours as needed (for pain.).     amLODipine (NORVASC) 2.5 MG tablet Take 1 tablet (2.5 mg total) by mouth daily. 90 tablet 1   Ascorbic Acid (VITAMIN C) 500 MG CAPS Take 500 mg by mouth daily.     aspirin EC 81 MG tablet Take 1 tablet (81 mg total) by mouth daily. 30 tablet 11   B Complex-C (B-COMPLEX WITH VITAMIN C) tablet Take 1 tablet by mouth daily.     Cholecalciferol (VITAMIN D3) 50 MCG (2000 UT) TABS Take 2,000 Units by mouth daily.     ferrous sulfate 325 (65 FE) MG tablet Take 325 mg by mouth daily.     FLUoxetine (PROZAC) 20 MG capsule Take 20 mg by mouth daily.     gabapentin (NEURONTIN) 600 MG tablet Take 600 mg by mouth at bedtime.     glucose blood (ONETOUCH VERIO) test strip USE AS DIRECTED FOUR- FIVE TIMES DAILY 450 each 1   Insulin Glargine (BASAGLAR KWIKPEN) 100 UNIT/ML SOPN INJECT 80 UNITS INTO SKIN DAILY AT 10 PM 30 mL 2   isosorbide mononitrate (IMDUR) 30 MG 24 hr tablet Take 2 tablets (60 mg total) by mouth daily. 90 tablet  3   levocetirizine (XYZAL) 5 MG tablet Take 1 tablet by mouth daily.     Magnesium 250 MG TABS Take by mouth.     Multiple Vitamin (MULTIVITAMIN WITH MINERALS) TABS tablet Take 1 tablet by mouth in the morning and at bedtime.     nitroGLYCERIN (NITROSTAT) 0.4 MG SL tablet Place 1 tablet (0.4 mg total) under the tongue every 5 (five) minutes x 3 doses as needed for chest pain. 25 tablet 3   ONETOUCH VERIO test strip USE TO TEST FOUR TIMES DAILY AS DIRECTED 450 each 2   Oxycodone HCl 10 MG TABS      oxyCODONE-acetaminophen (PERCOCET/ROXICET) 5-325 MG tablet Take 1 tablet by  mouth every 4 (four) hours as needed for severe pain.     pantoprazole (PROTONIX) 40 MG tablet TAKE 1 TABLET BY MOUTH DAILY BEFORE BREAKFAST 90 tablet 0   Pediatric Multivitamins-Iron (FLINTSTONES COMPLETE PO) Take 1 tablet by mouth daily.     simvastatin (ZOCOR) 10 MG tablet TAKE 1 TABLET BY MOUTH EVERY NIGHT AT BEDTIME 30 tablet 2   Sodium Bicarbonate-Citric Acid (ALKA-SELTZER HEARTBURN) 1940-1000 MG TBEF Take 1-2 tablets by mouth 2 (two) times daily as needed (heartburn/indigestion).     warfarin (COUMADIN) 5 MG tablet TAKE 1 AND 1/2 TABLETS BY MOUTH DAILY EXCEPT 1 TABLET ON MONDAYS AND THURSDAYS OR AS DIRECTED 45 tablet 5   No current facility-administered medications for this visit.   Allergies:  Patient has no known allergies.   ROS: No orthopnea or PND.  Physical Exam: VS:  BP 128/64   Pulse 60   Ht '6\' 4"'$  (1.93 m)   Wt 210 lb (95.3 kg)   SpO2 94%   BMI 25.56 kg/m , BMI Body mass index is 25.56 kg/m.  Wt Readings from Last 3 Encounters:  05/08/22 210 lb (95.3 kg)  01/14/22 228 lb 2.8 oz (103.5 kg)  11/11/21 231 lb 9.6 oz (105.1 kg)    General: Patient appears comfortable at rest. HEENT: Conjunctiva and lids normal. Neck: Supple, no elevated JVP or carotid bruits. Lungs: Clear to auscultation, nonlabored breathing at rest. Cardiac: Regular rate and rhythm, no S3, 2/6 systolic murmur. Extremities: No  pitting edema.  ECG:  An ECG dated 10/18/2021 was personally reviewed today and demonstrated:  Dual-chamber pacing.  Recent Labwork: 07/03/2021: ALT 21; AST 25; BUN 19; Creatinine, Ser 1.01; Potassium 4.3; Sodium 136 01/07/2022: Hemoglobin 11.8; Platelets 105  June 2023: BUN 9, creatinine 0.97, potassium 4.0, AST 29, ALT 22, cholesterol 91, triglycerides 77, HDL 43, LDL 32  Other Studies Reviewed Today:  Cardiac catheterization 06/29/2020: 1.  Severe native three-vessel coronary artery disease with severe stenosis of the proximal LAD and associated heavy calcification, chronic total occlusion of the ostial RCA, and moderately severe left circumflex stenosis 2.  Status post aortocoronary bypass surgery with continued patency of the LIMA to LAD graft, saphenous vein graft to second obtuse marginal, and saphenous vein graft to right PDA. 3.  Normal LVEDP   Recommendations: The patient has patency of all bypass conduits now 4 years out from coronary bypass surgery.  He has distal vessel disease, not amenable to PCI.  Recommend ongoing medical therapy/increase antianginal program.  Will add low-dose isosorbide as tolerated.   Echocardiogram 10/28/2021:  1. Left ventricular ejection fraction, by estimation, is 50%. The left  ventricle has low normal function. Left ventricular endocardial border not  optimally defined to evaluate regional wall motion. Left ventricular  diastolic parameters are  indeterminate.   2. Right ventricular systolic function is low normal. The right  ventricular size is normal.   3. The mitral valve is normal in structure. Trivial mitral valve  regurgitation. No evidence of mitral stenosis.   4. The tricuspid valve is abnormal.   5. The aortic valve is tricuspid. There is moderate calcification of the  aortic valve. There is moderate thickening of the aortic valve. Aortic  valve regurgitation is not visualized. Mild to moderate aortic valve  stenosis. Aortic valve mean  gradient  measures 15.4 mmHg. Aortic valve peak gradient measures 27.1 mmHg. Aortic  valve area, by VTI measures 1.23 cm.   Assessment and Plan:  1.  Multivessel CAD status post CABG with  patent bypass grafts in February 2022 but distal native vessel disease not amenable to PCI.  Plan to continue medical therapy for now including change in Imdur to 60 mg in the morning, continue Norvasc, aspirin, and Zocor.  2.  Mild to moderate degenerative calcific aortic stenosis by echocardiogram in June.  Mean gradient 15 mmHg at that time.  We will continue to follow.  3.  Paroxysmal atrial fibrillation, asymptomatic in terms of palpitations but detected by device interrogation.  He is on Coumadin with history of lupus anticoagulant positivity and is followed in the anticoagulation clinic.  CHA2DS2-VASc score is 4.  4.  St. Jude pacemaker in place with follow-up by Dr. Quentin Ore.  Medication Adjustments/Labs and Tests Ordered: Current medicines are reviewed at length with the patient today.  Concerns regarding medicines are outlined above.   Tests Ordered: No orders of the defined types were placed in this encounter.   Medication Changes: Meds ordered this encounter  Medications   isosorbide mononitrate (IMDUR) 30 MG 24 hr tablet    Sig: Take 2 tablets (60 mg total) by mouth daily.    Dispense:  90 tablet    Refill:  3    Disposition:  Follow up  6 months.  Signed, Satira Sark, MD, [facility] 05/08/2022 4:07 PM    [facility] [address] [place], [place] [address] Phone: [phone]; Fax: [phone]

## 2022-05-08 NOTE — Patient Instructions (Signed)
Medication Instructions:  Take Imdur 60 mg daily  Labwork: none  Testing/Procedures: none  Follow-Up: 6 months  Any Other Special Instructions Will Be Listed Below (If Applicable).  If you need a refill on your cardiac medications before your next appointment, please call your pharmacy.

## 2022-05-28 ENCOUNTER — Ambulatory Visit: Payer: BC Managed Care – PPO | Attending: Cardiology | Admitting: *Deleted

## 2022-05-28 DIAGNOSIS — Z5181 Encounter for therapeutic drug level monitoring: Secondary | ICD-10-CM

## 2022-05-28 DIAGNOSIS — I480 Paroxysmal atrial fibrillation: Secondary | ICD-10-CM | POA: Diagnosis not present

## 2022-05-28 DIAGNOSIS — D6869 Other thrombophilia: Secondary | ICD-10-CM

## 2022-05-28 LAB — POCT INR: INR: 2.7 (ref 2.0–3.0)

## 2022-05-28 NOTE — Patient Instructions (Signed)
-  Warfarin 5mg tablet Continue warfarin 1 1/2 tablets daily except 1 tablet on Mondays and Thursdays -Recheck in office in 6 wks 

## 2022-05-30 ENCOUNTER — Emergency Department (HOSPITAL_COMMUNITY)
Admission: EM | Admit: 2022-05-30 | Discharge: 2022-05-30 | Disposition: A | Discharge status: Home / Self Care | Attending: Emergency Medicine | Admitting: Emergency Medicine

## 2022-05-30 ENCOUNTER — Emergency Department (HOSPITAL_COMMUNITY)

## 2022-05-30 DIAGNOSIS — S6991XA Unspecified injury of right wrist, hand and finger(s), initial encounter: Secondary | ICD-10-CM | POA: Diagnosis present

## 2022-05-30 DIAGNOSIS — Y990 Civilian activity done for income or pay: Secondary | ICD-10-CM | POA: Insufficient documentation

## 2022-05-30 DIAGNOSIS — I10 Essential (primary) hypertension: Secondary | ICD-10-CM | POA: Insufficient documentation

## 2022-05-30 DIAGNOSIS — Z794 Long term (current) use of insulin: Secondary | ICD-10-CM | POA: Diagnosis not present

## 2022-05-30 DIAGNOSIS — S63501A Unspecified sprain of right wrist, initial encounter: Secondary | ICD-10-CM | POA: Diagnosis not present

## 2022-05-30 DIAGNOSIS — X58XXXA Exposure to other specified factors, initial encounter: Secondary | ICD-10-CM | POA: Diagnosis not present

## 2022-05-30 DIAGNOSIS — Z7982 Long term (current) use of aspirin: Secondary | ICD-10-CM | POA: Insufficient documentation

## 2022-05-30 DIAGNOSIS — Z79899 Other long term (current) drug therapy: Secondary | ICD-10-CM | POA: Diagnosis not present

## 2022-05-30 DIAGNOSIS — E119 Type 2 diabetes mellitus without complications: Secondary | ICD-10-CM | POA: Insufficient documentation

## 2022-05-30 MED ORDER — NAPROXEN 500 MG PO TABS: 500.0000 mg | ORAL_TABLET | Freq: Two times a day (BID) | ORAL | 0 refills | Status: DC

## 2022-05-30 MED ORDER — NAPROXEN 250 MG PO TABS
500.0000 mg | ORAL_TABLET | Freq: Once | ORAL | Status: AC
  Administered 2022-05-30: 500 mg via ORAL
  Filled 2022-05-30: qty 2

## 2022-05-30 NOTE — Discharge Instructions (Signed)
Your testing shows no broken bones in the wrist or the hand, this is likely related to a sprain or the tear of the ligament.  We have placed you in a Velcro splint which will help to keep your arm immobilized.  You may use ice, elevate and take the Naprosyn twice a day as needed for pain.  You can follow-up with the orthopedic surgeon within 7 to 10 days if not significantly better however many of the cases like this will take a couple of weeks to heal up

## 2022-05-30 NOTE — ED Triage Notes (Signed)
Pt to ED c/o right wrist injury that occurred today around 1 pm while at work. Reports was using an electric drill when occurred. Currently wrapped in ace bandage

## 2022-05-30 NOTE — ED Provider Notes (Signed)
[facility] EMERGENCY DEPARTMENT Provider Note   CSN: [id number] Arrival date & time: 05/30/22  1540     History  Chief Complaint  Patient presents with   Wrist Pain    right    Kelly [name] is a 67 y.o. male.   Wrist Pain   Patient is a 67 year old male, history of hypertension, history of diabetes, he has a history of atrial fibrillation and lupus and currently takes warfarin.  The patient was in his usual state of health working today with a drill trying to drill a hole through a piece of metal when the drill bit caught on the metal and caused his hand and arm to twist.  He had acute onset of pain, he has a bad hearing so he did not hear a pop or snap.  He has not been able to move the hand to the wrist since without significant pain.  He had a heat pack placed and was wrapped prior to arrival.  Patient is right-hand dominant    Home Medications Prior to Admission medications   Medication Sig Start Date End Date Taking? Authorizing Provider  naproxen (NAPROSYN) 500 MG tablet Take 1 tablet (500 mg total) by mouth 2 (two) times daily with a meal. 05/30/22  Yes Noemi Chapel, MD  acetaminophen (TYLENOL) 500 MG tablet Take 500-1,000 mg by mouth every 6 (six) hours as needed (for pain.).    [provider]  amLODipine (NORVASC) 2.5 MG tablet Take 1 tablet (2.5 mg total) by mouth daily. 03/11/22   Satira Sark, MD  Ascorbic Acid (VITAMIN C) 500 MG CAPS Take 500 mg by mouth daily.    [provider]  aspirin EC 81 MG tablet Take 1 tablet (81 mg total) by mouth daily. 03/16/20   Vickie Epley, MD  B Complex-C (B-COMPLEX WITH VITAMIN C) tablet Take 1 tablet by mouth daily.    [provider]  Cholecalciferol (VITAMIN D3) 50 MCG (2000 UT) TABS Take 2,000 Units by mouth daily.    [provider]  ferrous sulfate 325 (65 FE) MG tablet Take 325 mg by mouth daily.    [provider]  FLUoxetine (PROZAC) 20 MG capsule Take 20 mg by  mouth daily.    [provider]  gabapentin (NEURONTIN) 600 MG tablet Take 600 mg by mouth at bedtime.    [provider]  glucose blood (ONETOUCH VERIO) test strip USE AS DIRECTED FOUR- FIVE TIMES DAILY 09/18/17   Cassandria Anger, MD  Insulin Glargine (BASAGLAR KWIKPEN) 100 UNIT/ML SOPN INJECT 80 UNITS INTO SKIN DAILY AT 10 PM 10/20/17   Cassandria Anger, MD  isosorbide mononitrate (IMDUR) 30 MG 24 hr tablet Take 2 tablets (60 mg total) by mouth daily. 05/08/22 11/04/22  Satira Sark, MD  levocetirizine (XYZAL) 5 MG tablet Take 1 tablet by mouth daily. 03/05/22   [provider]  Magnesium 250 MG TABS Take by mouth.    [provider]  Multiple Vitamin (MULTIVITAMIN WITH MINERALS) TABS tablet Take 1 tablet by mouth in the morning and at bedtime.    [provider]  nitroGLYCERIN (NITROSTAT) 0.4 MG SL tablet Place 1 tablet (0.4 mg total) under the tongue every 5 (five) minutes x 3 doses as needed for chest pain. 04/10/21   Satira Sark, MD  [facility] VERIO test strip USE TO TEST FOUR TIMES DAILY AS DIRECTED 12/22/17   Cassandria Anger, MD  Oxycodone HCl 10 MG TABS  [provider]  oxyCODONE-acetaminophen (PERCOCET/ROXICET) 5-325 MG tablet Take 1 tablet by mouth every 4 (four) hours as needed for severe pain.    [provider]  pantoprazole (PROTONIX) 40 MG tablet TAKE 1 TABLET BY MOUTH DAILY BEFORE BREAKFAST 09/15/19   Erenest Rasher, PA-C  Pediatric Multivitamins-Iron [facility] COMPLETE PO) Take 1 tablet by mouth daily.    [provider]  simvastatin (ZOCOR) 10 MG tablet TAKE 1 TABLET BY MOUTH EVERY NIGHT AT BEDTIME 04/29/12   Satira Sark, MD  Sodium Bicarbonate-Citric Acid (ALKA-SELTZER HEARTBURN) 1940-1000 MG TBEF Take 1-2 tablets by mouth 2 (two) times daily as needed (heartburn/indigestion).    [provider]  warfarin (COUMADIN) 5 MG tablet TAKE 1 AND 1/2 TABLETS BY MOUTH  DAILY EXCEPT 1 TABLET ON MONDAYS AND THURSDAYS OR AS DIRECTED 03/11/22   Satira Sark, MD      Allergies    Patient has no known allergies.    Review of Systems   Review of Systems  All other systems reviewed and are negative.   Physical Exam Updated Vital Signs BP (!) 147/63 (BP Location: Left Arm)   Pulse (!) 59   Temp 97.8 F (36.6 C) (Oral)   Resp 16   Ht 1.93 m ('6\' 4"'$ )   Wt 95.3 kg   SpO2 98%   BMI 25.56 kg/m  Physical Exam Vitals and nursing note reviewed.  Constitutional:      General: He is not in acute distress.    Appearance: He is well-developed.  HENT:     Head: Normocephalic and atraumatic.     Mouth/Throat:     Pharynx: No oropharyngeal exudate.  Eyes:     General: No scleral icterus.       Right eye: No discharge.        Left eye: No discharge.     Conjunctiva/sclera: Conjunctivae normal.     Pupils: Pupils are equal, round, and reactive to light.  Neck:     Thyroid: No thyromegaly.     Vascular: No JVD.  Cardiovascular:     Rate and Rhythm: Normal rate and regular rhythm.     Heart sounds: Normal heart sounds. No murmur heard.    No friction rub. No gallop.  Pulmonary:     Effort: Pulmonary effort is normal. No respiratory distress.     Breath sounds: Normal breath sounds. No wheezing or rales.  Abdominal:     General: Bowel sounds are normal. There is no distension.     Palpations: Abdomen is soft. There is no mass.     Tenderness: There is no abdominal tenderness.  Musculoskeletal:        General: Tenderness present.     Cervical back: Normal range of motion and neck supple.     Right lower leg: No edema.     Left lower leg: No edema.     Comments: Abnormal right hand and wrist exam, there is tenderness over the wrist but no obvious deformities, no focal bony tenderness, it is more diffuse, he has difficulty flexing and extending the finger secondary to pain.  He has baseline sensation of the hand, states nothing is different there.   Normal capillary refill distally, normal radial artery pulse.  Lymphadenopathy:     Cervical: No cervical adenopathy.  Skin:    General: Skin is warm and dry.     Findings: No erythema or rash.  Neurological:     Mental Status: He is alert.  Coordination: Coordination normal.  Psychiatric:        Behavior: Behavior normal.     ED Results / Procedures / Treatments   Labs (all labs ordered are listed, but only abnormal results are displayed) Labs Reviewed - No data to display  EKG None  Radiology DG Wrist Complete Right  Result Date: 05/30/2022 CLINICAL DATA:  Injury using an electric drill EXAM: RIGHT WRIST - COMPLETE 3+ VIEW; RIGHT HAND - COMPLETE 3+ VIEW COMPARISON:  None Available. FINDINGS: Right hand: Frontal, oblique, and lateral views of the right hand are obtained. No acute fracture, subluxation, or dislocation. Mild osteoarthritis most pronounced in the distal interphalangeal joints. There is a 2 mm linear radiopaque foreign body within the radial soft tissues second digit adjacent to the middle phalanx, of uncertain acuity. There is diffuse atherosclerosis. Right wrist: Frontal, oblique, and lateral views are obtained. No acute fracture, subluxation, or dislocation. Joint spaces are well preserved. Soft tissues are unremarkable. There is diffuse atherosclerosis. IMPRESSION: 1. No acute fracture of the right hand or wrist. 2. 2 mm radiopaque foreign body along the radial soft tissues mid right second digit, of uncertain acuity. 3. Extensive atherosclerosis. Electronically Signed   By: Randa Ngo M.D.   On: 05/30/2022 16:51   DG Hand Complete Right  Result Date: 05/30/2022 CLINICAL DATA:  Injury using an electric drill EXAM: RIGHT WRIST - COMPLETE 3+ VIEW; RIGHT HAND - COMPLETE 3+ VIEW COMPARISON:  None Available. FINDINGS: Right hand: Frontal, oblique, and lateral views of the right hand are obtained. No acute fracture, subluxation, or dislocation. Mild osteoarthritis most  pronounced in the distal interphalangeal joints. There is a 2 mm linear radiopaque foreign body within the radial soft tissues second digit adjacent to the middle phalanx, of uncertain acuity. There is diffuse atherosclerosis. Right wrist: Frontal, oblique, and lateral views are obtained. No acute fracture, subluxation, or dislocation. Joint spaces are well preserved. Soft tissues are unremarkable. There is diffuse atherosclerosis. IMPRESSION: 1. No acute fracture of the right hand or wrist. 2. 2 mm radiopaque foreign body along the radial soft tissues mid right second digit, of uncertain acuity. 3. Extensive atherosclerosis. Electronically Signed   By: Randa Ngo M.D.   On: 05/30/2022 16:51    Procedures Procedures    Medications Ordered in ED Medications  naproxen (NAPROSYN) tablet 500 mg (has no administration in time range)    ED Course/ Medical Decision Making/ A&P                             Medical Decision Making Amount and/or Complexity of Data Reviewed Radiology: ordered.  Risk Prescription drug management.   Possible sprain or fracture, imaging pending, patient agreeable to the plan, vital signs unremarkable.  Imaging: I personally viewed and interpreted the x-rays of the hand and the wrist which show no signs of broken bones or dislocations.  Splint placed by nursing, Velcro splint, for position of comfort, anti-inflammatories given  Patient stable for discharge to follow-up with orthopedics as needed        Final Clinical Impression(s) / ED Diagnoses Final diagnoses:  Wrist sprain, right, initial encounter    Rx / DC Orders ED Discharge Orders          Ordered    naproxen (NAPROSYN) 500 MG tablet  2 times daily with meals        05/30/22 1846  Noemi Chapel, MD 05/30/22 [phone]

## 2022-06-10 ENCOUNTER — Ambulatory Visit: Payer: BC Managed Care – PPO | Attending: Cardiology

## 2022-06-10 DIAGNOSIS — I480 Paroxysmal atrial fibrillation: Secondary | ICD-10-CM | POA: Diagnosis not present

## 2022-06-11 LAB — CUP PACEART REMOTE DEVICE CHECK
Battery Remaining Longevity: 70 mo
Battery Remaining Percentage: 74 %
Battery Voltage: 2.99 V
Brady Statistic AP VP Percent: 48 %
Brady Statistic AP VS Percent: 1 %
Brady Statistic AS VP Percent: 50 %
Brady Statistic AS VS Percent: 1 %
Brady Statistic RA Percent Paced: 47 %
Brady Statistic RV Percent Paced: 98 %
Date Time Interrogation Session: 20240123195244
Implantable Lead Connection Status: 753985
Implantable Lead Connection Status: 753985
Implantable Lead Implant Date: 20211025
Implantable Lead Implant Date: 20211025
Implantable Lead Location: 753859
Implantable Lead Location: 753860
Implantable Pulse Generator Implant Date: 20211025
Lead Channel Impedance Value: 280 Ohm
Lead Channel Impedance Value: 390 Ohm
Lead Channel Pacing Threshold Amplitude: 1.25 V
Lead Channel Pacing Threshold Amplitude: 1.5 V
Lead Channel Pacing Threshold Pulse Width: 0.4 ms
Lead Channel Pacing Threshold Pulse Width: 0.6 ms
Lead Channel Sensing Intrinsic Amplitude: 1.5 mV
Lead Channel Sensing Intrinsic Amplitude: 7.3 mV
Lead Channel Setting Pacing Amplitude: 1.5 V
Lead Channel Setting Pacing Amplitude: 2.5 V
Lead Channel Setting Pacing Pulse Width: 0.4 ms
Lead Channel Setting Sensing Sensitivity: 2.5 mV
Pulse Gen Model: 2272
Pulse Gen Serial Number: 3873891

## 2022-07-07 NOTE — Progress Notes (Signed)
Remote pacemaker transmission.   

## 2022-07-09 ENCOUNTER — Ambulatory Visit: Payer: BC Managed Care – PPO | Attending: Cardiology | Admitting: *Deleted

## 2022-07-09 DIAGNOSIS — Z5181 Encounter for therapeutic drug level monitoring: Secondary | ICD-10-CM

## 2022-07-09 DIAGNOSIS — I480 Paroxysmal atrial fibrillation: Secondary | ICD-10-CM | POA: Diagnosis not present

## 2022-07-09 DIAGNOSIS — D6869 Other thrombophilia: Secondary | ICD-10-CM

## 2022-07-09 LAB — POCT INR: INR: 2.8 (ref 2.0–3.0)

## 2022-07-09 NOTE — Patient Instructions (Signed)
-  Warfarin 12m tablet Continue warfarin 1 1/2 tablets daily except 1 tablet on Mondays and Thursdays -Recheck in office in 6 wks

## 2022-08-07 ENCOUNTER — Ambulatory Visit (INDEPENDENT_AMBULATORY_CARE_PROVIDER_SITE_OTHER): Admitting: Orthopedic Surgery

## 2022-08-07 ENCOUNTER — Encounter: Payer: Self-pay | Admitting: Orthopedic Surgery

## 2022-08-07 VITALS — BP 125/80 | HR 76 | Ht 76.0 in | Wt 225.0 lb

## 2022-08-07 DIAGNOSIS — S6431XA Injury of digital nerve of right thumb, initial encounter: Secondary | ICD-10-CM | POA: Diagnosis not present

## 2022-08-07 DIAGNOSIS — R161 Splenomegaly, not elsewhere classified: Secondary | ICD-10-CM | POA: Insufficient documentation

## 2022-08-07 NOTE — Progress Notes (Signed)
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  He was seen in the emergency room imaging was negative  He was started on a pain reliever and an anti-inflammatory  He presents now 2 months later complaining of pain from the IP joint of his right thumb to the tip which she describes as severe constant relieved by pressure on the thumb  Past Medical History:  Diagnosis Date   Anxiety    Arthritis    Chronic pain    Cirrhosis ([place])    Coronary atherosclerosis of native coronary artery    BMS LAD and PTCA RCA 2000,BMS RCA/PLA 2001, CABG June 2018   Diverticulitis    Essential hypertension    Fatty liver    Gastric ulcer    GERD (gastroesophageal reflux disease)    Hyperlipidemia    Low-grade NHL (non-Hodgkin's lymphoma) 2007   Lupus anticoagulant positive    MI (myocardial infarction) ([place]) 2001   Pacemaker    Paresthesia of upper limb    Peripheral neuropathy    Thrombocytopenia (HCC)    Trifascicular block    Type 2 diabetes mellitus ([place])    Umbilical hernia    Past Surgical History:  Procedure Laterality Date   Axillary abscess     left incision and drainage left   BACK SURGERY     multiple   BIOPSY  08/15/2014   Procedure: BIOPSY;  Surgeon: Danie Binder, MD;  Location: [facility];  Service: Endoscopy;;  Gastric   BIOPSY  01/19/2015   Procedure: BIOPSY (GASTRIC ULCER);  Surgeon: Danie Binder, MD;  Location: [facility];  Service: Endoscopy;;   CARDIAC CATHETERIZATION     cardiac stents     x5 stents   COLONOSCOPY  June 2008   Dr. Dellis Filbert Medoff: Mild left colonic diverticulosis   CORONARY ARTERY BYPASS GRAFT N/A 11/11/2016   Procedure: CORONARY ARTERY BYPASS GRAFTING (CABG) x 3, USING LEFT MAMMARY ARTERY AND  RIGHT GREATER SAPHENOUS VEIN HARVESTED ENDOSCOPICALLY;  Surgeon: Ivin Poot, MD;  Location: [place];  Service: Open Heart Surgery;  Laterality: N/A;   Dental extractions     ESOPHAGOGASTRODUODENOSCOPY  June 2008   Dr. Dellis Filbert Medoff: Gastric ulcer, antral, biopsy with reactive changes associated glandular atrophy, no H pylori. No features of lymphoma. Likely NSAID related   ESOPHAGOGASTRODUODENOSCOPY  August 2008   Dr. Dellis Filbert Medoff: Complete healing of gastric ulcers.   ESOPHAGOGASTRODUODENOSCOPY (EGD) WITH PROPOFOL N/A 08/15/2014   SLF: 1. Abnormal pain due to ulcers & gastriitis    ESOPHAGOGASTRODUODENOSCOPY (EGD) WITH PROPOFOL N/A 01/19/2015   SLF: 1. Persistant ulcer with firm base 2. single gastric polyp found in the gastric antrum. Ulcer base    EUS N/A 03/21/2015   Thickened gastric wall in pre-pyloric antrum, no obvious intramural mass, fatty pancreas, no obvious pancreatic mass.    Inguinal lymph node biopsy     right inguinal lymph node biopsy   INTRAVASCULAR PRESSURE WIRE/FFR STUDY N/A 09/26/2016   Procedure: Intravascular Pressure Wire/FFR Study;  Surgeon: Nelva Bush, MD;  Location: [place] [facility];  Service: Cardiovascular;  Laterality: N/A;   LEFT HEART CATH AND CORONARY ANGIOGRAPHY N/A 09/26/2016   Procedure: Left Heart Cath and Coronary Angiography;  Surgeon: Nelva Bush, MD;  Location: [place] [facility];  Service: Cardiovascular;  Laterality: N/A;   LEFT HEART CATH AND CORS/GRAFTS ANGIOGRAPHY N/A 06/29/2020   Procedure: LEFT HEART CATH AND CORS/GRAFTS ANGIOGRAPHY;  Surgeon: Sherren Mocha, MD;  Location: [place] [facility];  Service: Cardiovascular;  Laterality: N/A;   NECK SURGERY     PACEMAKER IMPLANT N/A 03/12/2020   Procedure: PACEMAKER IMPLANT;  Surgeon: Vickie Epley, MD;  Location: [place] [facility];  Service: Cardiovascular;  Laterality: N/A;   POLYPECTOMY  01/19/2015   Procedure: POLYPECTOMY (GASTRIC);  Surgeon: Danie Binder, MD;  Location: [facility];  Service: Endoscopy;;   ROUX-EN-Y GASTRIC BYPASS     RNY gastric bypass   SHOULDER ARTHROSCOPY Left    TEE WITHOUT CARDIOVERSION N/A 11/11/2016   Procedure: TRANSESOPHAGEAL ECHOCARDIOGRAM (TEE);  Surgeon: Prescott Gum, Collier Salina, MD;  Location: [place];  Service: Open Heart Surgery;  Laterality: N/A;   Physical Exam Vitals and nursing note reviewed.  Constitutional:      Appearance: Normal appearance.  HENT:     Head: Normocephalic and atraumatic.  Eyes:     General: No scleral icterus.       Right eye: No discharge.        Left eye: No discharge.     Extraocular Movements: Extraocular movements intact.     Conjunctiva/sclera: Conjunctivae normal.     Pupils: Pupils are equal, round, and reactive to light.  Cardiovascular:     Rate and Rhythm: Normal rate.     Pulses: Normal pulses.  Skin:    General: Skin is warm and dry.     Capillary Refill: Capillary refill takes less than 2 seconds.  Neurological:     Mental Status: He is alert and oriented to person, place, and time.     Comments: He has decreased sensation at the tip of his right thumb on both sides and on the palmar aspect.  IP joint range of motion is normal he can oppose the thumb to the small finger he has no ligamentous instability at the mid carpal phalangeal joint  Psychiatric:        Mood and Affect: Mood normal.        Behavior: Behavior normal.        Thought Content: Thought content normal.        Judgment: Judgment normal.     Assessment and plan   Encounter Diagnosis  Name Primary?   Injury of digital nerve of right thumb, initial encounter Yes   68 year old male still having pain at the tip of his right thumb on the dominant side which appears to be some type of nerve injury.  I would recommend desensitization techniques.  He is already on gabapentin so that time will not help.  He is on vitamin C already as well.  He should see a hand specialist for further management of this injury OT  Referral  to hand

## 2022-08-20 ENCOUNTER — Ambulatory Visit: Payer: BC Managed Care – PPO | Admitting: Orthopaedic Surgery

## 2022-08-20 ENCOUNTER — Ambulatory Visit: Payer: BC Managed Care – PPO | Attending: Cardiology | Admitting: *Deleted

## 2022-08-20 DIAGNOSIS — I480 Paroxysmal atrial fibrillation: Secondary | ICD-10-CM | POA: Diagnosis not present

## 2022-08-20 DIAGNOSIS — Z5181 Encounter for therapeutic drug level monitoring: Secondary | ICD-10-CM | POA: Diagnosis not present

## 2022-08-20 DIAGNOSIS — D6869 Other thrombophilia: Secondary | ICD-10-CM | POA: Diagnosis not present

## 2022-08-20 LAB — POCT INR: INR: 5.8 — AB (ref 2.0–3.0)

## 2022-08-20 NOTE — Patient Instructions (Addendum)
-  Warfarin 5mg  tablet Hold warfarin x 3 days then resume 1 1/2 tablets daily except 1 tablet on Mondays and Thursdays -Recheck in office in 1 wk Denies s/s of bleeding or excessive bruising.  Bleeding and fall precautions discussed with pt and he verbalized understanding.

## 2022-08-23 ENCOUNTER — Other Ambulatory Visit: Payer: Self-pay | Admitting: Cardiology

## 2022-08-28 ENCOUNTER — Ambulatory Visit: Payer: BC Managed Care – PPO | Attending: Cardiology | Admitting: *Deleted

## 2022-08-28 ENCOUNTER — Telehealth: Payer: Self-pay | Admitting: Orthopedic Surgery

## 2022-08-28 DIAGNOSIS — Z5181 Encounter for therapeutic drug level monitoring: Secondary | ICD-10-CM

## 2022-08-28 DIAGNOSIS — I480 Paroxysmal atrial fibrillation: Secondary | ICD-10-CM

## 2022-08-28 DIAGNOSIS — D6869 Other thrombophilia: Secondary | ICD-10-CM

## 2022-08-28 LAB — POCT INR: INR: 3 (ref 2.0–3.0)

## 2022-08-28 MED ORDER — WARFARIN SODIUM 5 MG PO TABS: ORAL_TABLET | ORAL | 5 refills | Status: DC

## 2022-08-28 NOTE — Telephone Encounter (Signed)
He needs to see hand surgeon And go for OT   Kelly [name] has sent over everyting  To her to respond WC

## 2022-08-28 NOTE — Telephone Encounter (Signed)
Dr. Mort Sawyers pt - Dois Davenport w/[facility] [phone] called, stated that she has not gotten an order for the patient's surgery and that the notes sent do not state what type of surgery the patient will be having.

## 2022-08-28 NOTE — Patient Instructions (Signed)
Decrease warfarin to 1 1/2 tablets daily except 1 tablet on Tuesdays, Thursdays and Saturdays -Recheck in office in 4 wk

## 2022-09-09 ENCOUNTER — Ambulatory Visit (INDEPENDENT_AMBULATORY_CARE_PROVIDER_SITE_OTHER): Payer: BC Managed Care – PPO

## 2022-09-09 DIAGNOSIS — I480 Paroxysmal atrial fibrillation: Secondary | ICD-10-CM | POA: Diagnosis not present

## 2022-09-10 ENCOUNTER — Telehealth: Payer: Self-pay | Admitting: *Deleted

## 2022-09-10 LAB — CUP PACEART REMOTE DEVICE CHECK
Battery Remaining Longevity: 65 mo
Battery Remaining Percentage: 71 %
Battery Voltage: 2.99 V
Brady Statistic AP VP Percent: 48 %
Brady Statistic AP VS Percent: 1 %
Brady Statistic AS VP Percent: 51 %
Brady Statistic AS VS Percent: 1 %
Brady Statistic RA Percent Paced: 47 %
Brady Statistic RV Percent Paced: 98 %
Date Time Interrogation Session: 20240423040013
Implantable Lead Connection Status: 753985
Implantable Lead Connection Status: 753985
Implantable Lead Implant Date: 20211025
Implantable Lead Implant Date: 20211025
Implantable Lead Location: 753859
Implantable Lead Location: 753860
Implantable Pulse Generator Implant Date: 20211025
Lead Channel Impedance Value: 300 Ohm
Lead Channel Impedance Value: 360 Ohm
Lead Channel Pacing Threshold Amplitude: 1.5 V
Lead Channel Pacing Threshold Amplitude: 1.75 V
Lead Channel Pacing Threshold Pulse Width: 0.4 ms
Lead Channel Pacing Threshold Pulse Width: 0.6 ms
Lead Channel Sensing Intrinsic Amplitude: 2.6 mV
Lead Channel Sensing Intrinsic Amplitude: 9.2 mV
Lead Channel Setting Pacing Amplitude: 2 V
Lead Channel Setting Pacing Amplitude: 2.5 V
Lead Channel Setting Pacing Pulse Width: 0.4 ms
Lead Channel Setting Sensing Sensitivity: 2.5 mV
Pulse Gen Model: 2272
Pulse Gen Serial Number: 3873891

## 2022-09-10 NOTE — Telephone Encounter (Signed)
Received fax from [place], [place] ~ [phone]~ telephone/ [phone]~ fax.   Electronically signed HIPPA Compliant Authorization for Release of Medical Information enclosed with date of 09/02/2022.  Medical records requested: chart notes from 18- present   Records from [facility] faxed to [facility] [place] ~ [phone]~ telephone/ [phone]~ fax.

## 2022-09-10 NOTE — Telephone Encounter (Signed)
Received faxed confirmation.  

## 2022-09-29 ENCOUNTER — Ambulatory Visit: Payer: BC Managed Care – PPO | Attending: Cardiology | Admitting: *Deleted

## 2022-09-29 DIAGNOSIS — D6869 Other thrombophilia: Secondary | ICD-10-CM

## 2022-09-29 DIAGNOSIS — I480 Paroxysmal atrial fibrillation: Secondary | ICD-10-CM

## 2022-09-29 DIAGNOSIS — Z5181 Encounter for therapeutic drug level monitoring: Secondary | ICD-10-CM | POA: Diagnosis not present

## 2022-09-29 LAB — POCT INR: INR: 2.1 (ref 2.0–3.0)

## 2022-09-29 NOTE — Patient Instructions (Signed)
Continue warfarin to 1 1/2 tablets daily except 1 tablet on Tuesdays, Thursdays and Saturdays -Recheck in office in 4 wk

## 2022-10-07 NOTE — Progress Notes (Signed)
Remote pacemaker transmission.   

## 2022-10-14 ENCOUNTER — Ambulatory Visit: Payer: BC Managed Care – PPO | Admitting: Student

## 2022-10-20 NOTE — Telephone Encounter (Signed)
Received call from Velna Hatchet, records support specialist for [place], [place] ~ [phone]~ telephone/ [phone]~ fax.   Direct Line: [phone]~ telephone  Requested clarification on medical records. Reports that only 3 page of records were received.   Advised that records can only be sent from [facility]. If more records are required, advised to contact [facility] Information Management at [phone]~ telephone/ [phone]~ fax.

## 2022-10-27 ENCOUNTER — Other Ambulatory Visit: Payer: Self-pay | Admitting: Cardiology

## 2022-10-27 ENCOUNTER — Ambulatory Visit: Payer: Medicare PPO | Attending: Cardiology | Admitting: *Deleted

## 2022-10-27 DIAGNOSIS — D6869 Other thrombophilia: Secondary | ICD-10-CM

## 2022-10-27 DIAGNOSIS — Z5181 Encounter for therapeutic drug level monitoring: Secondary | ICD-10-CM | POA: Diagnosis not present

## 2022-10-27 DIAGNOSIS — I480 Paroxysmal atrial fibrillation: Secondary | ICD-10-CM

## 2022-10-27 LAB — POCT INR: INR: 2.3 (ref 2.0–3.0)

## 2022-10-27 NOTE — Patient Instructions (Signed)
Continue warfarin 1 1/2 tablets daily except 1 tablet on Tuesdays, Thursdays and Saturdays -Recheck in office in 6 wk

## 2022-10-28 NOTE — Telephone Encounter (Signed)
Received call from Velna Hatchet, records support specialist for [place], [place] ~ [phone]~ telephone/ [phone]~ fax.    Direct Line: [phone]~ telephone   Requested clarification on medical records. Reports that only 3 page of records were received.    Again advised that records can only be sent from [facility]. If more records are required, advised to contact [facility] Information Management at [phone]~ telephone/ [phone]~ fax.

## 2022-10-30 NOTE — Progress Notes (Signed)
  Electrophysiology Office Note:   Date:  10/31/2022  ID:  Kelly [name], DOB 1955/12/04, MRN [id number]  Primary Cardiologist: Nona Dell, MD Electrophysiologist: Lanier Prude, MD      History of Present Illness:   Kelly [name] is a 67 y.o. male with h/o CAD s/p CABG 2018, HTN, HLD, low-grade NH lymphoma, lupus/lupus anticoagulant, DM2, and advanced HB s/p PPM seen today for routine electrophysiology followup.   Since last being seen in our clinic the patient reports doing well overall. He was working a very stressful job and having stable angina, requiring NTG several times a week and sometimes multiple times a day, though since he has since moved to a more peaceful position, and has not had any chest pain at all. Has not needed any NTG in several months. He does continue to have intermittent lightheadedness and dizziness, unclear if has worsened in pass several months in setting of gradual RV threshold increase. No syncope.  Mild SOB with moderate or more exertion.  Review of systems complete and found to be negative unless listed in HPI.   Device information Abbott dual chamber PPM implanted 03/12/2020   AFib/AAD Hx Diagnosed July 2022   Studies Reviewed:    PPM Interrogation-  reviewed in detail today,  See PACEART report.  EKG is ordered today. Personal review shows AV pacing with intermittent loss of capture, with a wide ventricular escape in the 30s.   Echo 10/2021 LVEF 50%, trivial MR, Mild/Mod AS   Physical Exam:   VS:  BP 118/72   Pulse 73   Ht 6\' 4"  (1.93 m)   Wt 230 lb 6.4 oz (104.5 kg)   SpO2 97%   BMI 28.05 kg/m    Wt Readings from Last 3 Encounters:  10/31/22 230 lb 6.4 oz (104.5 kg)  08/07/22 225 lb (102.1 kg)  05/30/22 210 lb (95.3 kg)    GEN: Well nourished, well developed in no acute distress NECK: No JVD; No carotid bruits CARDIAC: Irregular rate and rhythm, no murmurs, rubs, gallops RESPIRATORY:  Clear to auscultation without rales,  wheezing or rhonchi  ABDOMEN: Soft, non-tender, non-distended EXTREMITIES:  No edema; No deformity   ASSESSMENT AND PLAN:    Advanced AV block  s/p Abbott PPM  Pacemaker Lead Malfunction Gradual increase in RV threshold over the past several months, and now > 4.5V @ 1 ms Bipolar.  Threshold 3.5V @ 1.0 ms Unipolar. Programmed 5.0V @ 1.73ms and will get CXR today.  Reviewed with Dr. Ladona Ridgel and Dr. Lalla Brothers. As long as has stable pacing will plan visit next week to discuss revision. If pt has worsening symptoms should report to ED. If he has syncope will need to call 911.  Today he has a stable escape in the 30s and is asymptomatic at rest with no pacing. Pacing consistently when he left with changes above.  See Pace Art report  PAF EKG today shows loss of capture (NSR) Continue coumadin per coumadin clinic for CHA2DS2VASc  of at least 3  CAD Denies s/s ischemia  Chronic lymphedema RLE Improved.  ABIs WNL with leg pain 10/2021  Disposition:   Follow up with Dr. Lalla Brothers  in 1 week.   Signed, Graciella Freer, PA-C

## 2022-10-31 ENCOUNTER — Ambulatory Visit: Payer: Medicare PPO | Attending: Student | Admitting: Student

## 2022-10-31 ENCOUNTER — Encounter: Payer: Self-pay | Admitting: Student

## 2022-10-31 ENCOUNTER — Ambulatory Visit
Admission: RE | Admit: 2022-10-31 | Discharge: 2022-10-31 | Disposition: A | Discharge status: Home / Self Care | Payer: Medicare PPO | Source: Ambulatory Visit | Attending: Student | Admitting: Student

## 2022-10-31 VITALS — BP 118/72 | HR 73 | Ht 76.0 in | Wt 230.4 lb

## 2022-10-31 DIAGNOSIS — I25119 Atherosclerotic heart disease of native coronary artery with unspecified angina pectoris: Secondary | ICD-10-CM

## 2022-10-31 DIAGNOSIS — Z950 Presence of cardiac pacemaker: Secondary | ICD-10-CM

## 2022-10-31 DIAGNOSIS — I480 Paroxysmal atrial fibrillation: Secondary | ICD-10-CM

## 2022-10-31 DIAGNOSIS — I453 Trifascicular block: Secondary | ICD-10-CM

## 2022-10-31 LAB — CUP PACEART INCLINIC DEVICE CHECK
Battery Remaining Longevity: 18 mo
Battery Voltage: 2.96 V
Brady Statistic RA Percent Paced: 48 %
Brady Statistic RV Percent Paced: 98 %
Date Time Interrogation Session: 20240614100012
Implantable Lead Connection Status: 753985
Implantable Lead Connection Status: 753985
Implantable Lead Implant Date: 20211025
Implantable Lead Implant Date: 20211025
Implantable Lead Location: 753859
Implantable Lead Location: 753860
Implantable Pulse Generator Implant Date: 20211025
Lead Channel Impedance Value: 237.5 Ohm
Lead Channel Impedance Value: 275 Ohm
Lead Channel Pacing Threshold Amplitude: 1.25 V
Lead Channel Pacing Threshold Amplitude: 1.25 V
Lead Channel Pacing Threshold Amplitude: 3.5 V
Lead Channel Pacing Threshold Amplitude: 3.5 V
Lead Channel Pacing Threshold Pulse Width: 0.6 ms
Lead Channel Pacing Threshold Pulse Width: 0.6 ms
Lead Channel Pacing Threshold Pulse Width: 1 ms
Lead Channel Pacing Threshold Pulse Width: 1 ms
Lead Channel Sensing Intrinsic Amplitude: 1.1 mV
Lead Channel Sensing Intrinsic Amplitude: 3.2 mV
Lead Channel Setting Pacing Amplitude: 2.5 V
Lead Channel Setting Pacing Amplitude: 5 V
Lead Channel Setting Pacing Pulse Width: 1 ms
Lead Channel Setting Sensing Sensitivity: 2.5 mV
Pulse Gen Model: 2272
Pulse Gen Serial Number: 3873891

## 2022-10-31 NOTE — Patient Instructions (Signed)
Medication Instructions:  Your physician recommends that you continue on your current medications as directed. Please refer to the Current Medication list given to you today.  *If you need a refill on your cardiac medications before your next appointment, please call your pharmacy*  Lab Work: None ordered If you have labs (blood work) drawn today and your tests are completely normal, you will receive your results only by: MyChart Message (if you have MyChart) OR A paper copy in the mail If you have any lab test that is abnormal or we need to change your treatment, we will call you to review the results.  Testing/Procedures: Chest X-ray Instructions:    1. You may have this done at the [facility], located in the [facility] on the 1st floor.    2. You do no have to have an appointment.    3. [address] [place], [place] [address]        [phone]        Monday - Friday  8:00 am - 5:00 pm   Follow-Up: At [facility], you and your health needs are our priority.  As part of our continuing mission to provide you with exceptional heart care, we have created designated Provider Care Teams.  These Care Teams include your primary Cardiologist (physician) and Advanced Practice Providers (APPs -  Physician Assistants and Nurse Practitioners) who all work together to provide you with the care you need, when you need it.   Your next appointment:   11/07/22 at 8:45 AM  Provider:   [name], MD

## 2022-11-06 NOTE — Progress Notes (Signed)
Electrophysiology Office Follow up Visit Note:    Date:  11/07/2022   ID:  Kelly [name], DOB 1956-03-09, MRN [id number]  PCP:  Benita Stabile, MD  [facility] Cardiologist:  Nona Dell, MD  [facility] Electrophysiologist:  Lanier Prude, MD    Interval History:    Kelly [name] is a 67 y.o. male who presents for a follow up visit.   I last saw the patient September 11, 2020.  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  He has mild to moderate aortic stenosis.  He has a history of coronary artery disease and had a prior bypass surgery.  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  He was seen recently by Mardelle Matte in the clinic October 31, 2022.  At that appointment he reported intermittent lightheadedness and dizziness.  EKG at that appointment showed intermittent ventricular capture with a ventricular escape in the 30s.  Pacemaker interrogation confirmed an elevated capture threshold.   He was previously working at [employer] as a Psychologist, occupational.  This job was very stressful and he is working up to 96 hours/week.  He was taking nitroglycerin almost daily given all the stress and related chest pains.  Since changing jobs he has had almost a complete elimination of chest pain episodes.  No presyncope or syncope.  He intermittently will feel dizzy when he stands up but it quickly resolves.  He has not needed nitroglycerin.    Past medical, surgical, social and family history were reviewed.  ROS:   Please see the history of present illness.    All other systems reviewed and are negative.  EKGs/Labs/Other Studies Reviewed:    The following studies were reviewed today:  November 07, 2022 in clinic device interrogation personally reviewed Capture threshold today in clinic unipolar 3.5 at 1 ms. Atrial lead parameters stable Dependent on ventricular pacing at 30  October 31, 2022 EKG shows complete heart block, lack of ventricular lead  capture   September 10, 2022 remote interrogation Ventricular lead threshold 1.75-0.4      October 31, 2022 chest x-ray reviewed-stable lead positions   Physical Exam:    VS:  BP 122/70   Pulse 60   Ht 6\' 4"  (1.93 m)   Wt 226 lb 12.8 oz (102.9 kg)   SpO2 97%   BMI 27.61 kg/m     Wt Readings from Last 3 Encounters:  11/07/22 226 lb 12.8 oz (102.9 kg)  10/31/22 230 lb 6.4 oz (104.5 kg)  08/07/22 225 lb (102.1 kg)     GEN:  Well nourished, well developed in no acute distress CARDIAC: RRR, 2 out of 6 crescendo decrescendo systolic murmur at the upper sternal borders.  No rubs, gallops.  Pacemaker pocket well-healed RESPIRATORY:  Clear to auscultation without rales, wheezing or rhonchi       ASSESSMENT:    1. Cardiac pacemaker in situ   2. Failure of pacemaker lead, initial encounter   3. Paroxysmal atrial fibrillation (HCC)    PLAN:    In order of problems listed above:  #Permanent pacemaker in situ #Pacemaker lead failure The patient has a permanent pacemaker in situ with underlying complete heart block.  The RV lead now has a significantly elevated RV capture threshold.  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  The lead was implanted in 2021 and is a [employer] LPA 1200 M lead.  Given the dwell time, this will need to be  removed and replaced in the operating room with cardiothoracic surgical backup.  I discussed the lead extraction procedure in detail including the risks and he wishes to proceed.  He will hold his Coumadin for 5 days prior to the procedure and bridge with Lovenox given his history of recurrent DVTs.  I will ask our pharmacy team to assist with this.  I will likely restart Coumadin postop and let his INR drift up.  Risks, benefits, alternatives to PPM extraction and implantation were discussed in detail with the patient today. The patient understands that the risks include but are not limited to bleeding, infection, pneumothorax, perforation,  tamponade, vascular damage, renal failure, MI, stroke, death, lead dislodgement, severe bleeding requiring emergent open heart surgery and wishes to proceed.  We will therefore schedule device implantation at the next available time.  He will need a CT scan (lead extraction protocol) prior to the procedure.  He needs an echo prior to the procedure to confirm stable aortic valve gradients.  #Paroxysmal atrial fibrillation On Coumadin for stroke prophylaxis.  Hold Coumadin as above.      Signed, [name], MD, [facility], [facility] 11/07/2022 8:12 AM    Electrophysiology [facility]

## 2022-11-07 ENCOUNTER — Ambulatory Visit: Payer: Medicare PPO | Attending: Cardiology | Admitting: Cardiology

## 2022-11-07 ENCOUNTER — Encounter (HOSPITAL_COMMUNITY): Payer: Self-pay

## 2022-11-07 ENCOUNTER — Ambulatory Visit (INDEPENDENT_AMBULATORY_CARE_PROVIDER_SITE_OTHER): Payer: Medicare PPO | Admitting: Pharmacist

## 2022-11-07 ENCOUNTER — Encounter: Payer: Self-pay | Admitting: Cardiology

## 2022-11-07 ENCOUNTER — Other Ambulatory Visit: Payer: Self-pay

## 2022-11-07 VITALS — BP 122/70 | HR 60 | Ht 76.0 in | Wt 226.8 lb

## 2022-11-07 DIAGNOSIS — R760 Raised antibody titer: Secondary | ICD-10-CM

## 2022-11-07 DIAGNOSIS — I480 Paroxysmal atrial fibrillation: Secondary | ICD-10-CM

## 2022-11-07 DIAGNOSIS — Z950 Presence of cardiac pacemaker: Secondary | ICD-10-CM

## 2022-11-07 DIAGNOSIS — I82409 Acute embolism and thrombosis of unspecified deep veins of unspecified lower extremity: Secondary | ICD-10-CM

## 2022-11-07 DIAGNOSIS — T82110A Breakdown (mechanical) of cardiac electrode, initial encounter: Secondary | ICD-10-CM

## 2022-11-07 LAB — CUP PACEART INCLINIC DEVICE CHECK
Battery Remaining Longevity: 18 mo
Battery Voltage: 2.96 V
Brady Statistic RA Percent Paced: 48 %
Brady Statistic RV Percent Paced: 98 %
Date Time Interrogation Session: 20240621081717
Implantable Lead Connection Status: 753985
Implantable Lead Connection Status: 753985
Implantable Lead Implant Date: 20211025
Implantable Lead Implant Date: 20211025
Implantable Lead Location: 753859
Implantable Lead Location: 753860
Implantable Pulse Generator Implant Date: 20211025
Lead Channel Impedance Value: 237.5 Ohm
Lead Channel Impedance Value: 300 Ohm
Lead Channel Pacing Threshold Amplitude: 1.875 V
Lead Channel Pacing Threshold Amplitude: 3.5 V
Lead Channel Pacing Threshold Amplitude: 3.5 V
Lead Channel Pacing Threshold Pulse Width: 0.5 ms
Lead Channel Pacing Threshold Pulse Width: 1 ms
Lead Channel Pacing Threshold Pulse Width: 1 ms
Lead Channel Sensing Intrinsic Amplitude: 0.9 mV
Lead Channel Setting Pacing Amplitude: 2.5 V
Lead Channel Setting Pacing Amplitude: 5 V
Lead Channel Setting Pacing Pulse Width: 1 ms
Lead Channel Setting Sensing Sensitivity: 2.5 mV
Pulse Gen Model: 2272
Pulse Gen Serial Number: 3873891

## 2022-11-07 MED ORDER — ENOXAPARIN SODIUM 150 MG/ML IJ SOSY: 150.0000 mg | PREFILLED_SYRINGE | INTRAMUSCULAR | 0 refills | Status: DC

## 2022-11-07 NOTE — Progress Notes (Signed)
Patient [name]                 DOB: 06-27-1955                      MRN: [id number]     HPI: Kelly [name] is a 67 y.o. male referred by Dr. Lalla Brothers to PharmD for add on appt to coordinate Lovenox bridge. Pt is pending lead extraction on 6/28 and takes warfarin for history of afib, recurrent DVTs, and Lupus + anticoagulant.  Weight = 103kg BMET and CBC being checked today, most recent CBC on file from 12/2021. Hgb low stable ~12, Plt low stable 105k. Most recent BMET on file 07/03/21, SCr 1.01, 65mL/min using adjusted body weight.  Will bridge as 1.5mg /kg once daily to decrease injection burden = Lovenox 150mg  once daily.  [facility] in [facility] Cou[facility].  INR today = 2.1  Past Medical History:  Diagnosis Date   Anxiety    Arthritis    Chronic pain    Cirrhosis (HCC)    Coronary atherosclerosis of native coronary artery    BMS LAD and PTCA RCA 2000,BMS RCA/PLA 2001, CABG June 2018   Diverticulitis    Essential hypertension    Fatty liver    Gastric ulcer    GERD (gastroesophageal reflux disease)    Hyperlipidemia    Low-grade NHL (non-Hodgkin's lymphoma) 2007   Lupus anticoagulant positive    MI (myocardial infarction) (HCC) 2001   Pacemaker    Paresthesia of upper limb    Peripheral neuropathy    Thrombocytopenia (HCC)    Trifascicular block    Type 2 diabetes mellitus (HCC)    Umbilical hernia     Current Outpatient Medications on File Prior to Visit  Medication Sig Dispense Refill   acetaminophen (TYLENOL) 500 MG tablet Take 500-1,000 mg by mouth every 6 (six) hours as needed (for pain.).     amLODipine (NORVASC) 2.5 MG tablet TAKE 1 TABLET(2.5 MG) BY MOUTH DAILY 90 tablet 1   Ascorbic Acid (VITAMIN C) 500 MG CAPS Take 500 mg by mouth daily.     aspirin EC 81 MG tablet Take 1 tablet (81 mg total) by mouth daily. 30 tablet 11   B Complex-C (B-COMPLEX WITH VITAMIN C) tablet Take 1 tablet by mouth daily.     Cholecalciferol (VITAMIN D3) 50 MCG  (2000 UT) TABS Take 2,000 Units by mouth daily.     ferrous sulfate 325 (65 FE) MG tablet Take 325 mg by mouth daily.     FLUoxetine (PROZAC) 20 MG capsule Take 20 mg by mouth daily.     gabapentin (NEURONTIN) 600 MG tablet Take 600 mg by mouth at bedtime.     glucose blood (ONETOUCH VERIO) test strip USE AS DIRECTED FOUR- FIVE TIMES DAILY 450 each 1   Insulin Glargine (BASAGLAR KWIKPEN) 100 UNIT/ML SOPN INJECT 80 UNITS INTO SKIN DAILY AT 10 PM 30 mL 2   isosorbide mononitrate (IMDUR) 30 MG 24 hr tablet Take 2 tablets (60 mg total) by mouth daily. 90 tablet 3   Magnesium 250 MG TABS Take by mouth.     Multiple Vitamin (MULTIVITAMIN WITH MINERALS) TABS tablet Take 1 tablet by mouth in the morning and at bedtime.     nitroGLYCERIN (NITROSTAT) 0.4 MG SL tablet Place 1 tablet (0.4 mg total) under the tongue every 5 (five) minutes x 3 doses as needed for chest pain. 25 tablet 3   ONETOUCH VERIO  test strip USE TO TEST FOUR TIMES DAILY AS DIRECTED 450 each 2   oxyCODONE-acetaminophen (PERCOCET/ROXICET) 5-325 MG tablet Take 1 tablet by mouth every 4 (four) hours as needed for severe pain.     pantoprazole (PROTONIX) 40 MG tablet TAKE 1 TABLET BY MOUTH DAILY BEFORE BREAKFAST 90 tablet 0   Pediatric Multivitamins-Iron (FLINTSTONES COMPLETE PO) Take 1 tablet by mouth daily.     simvastatin (ZOCOR) 10 MG tablet TAKE 1 TABLET BY MOUTH EVERY NIGHT AT BEDTIME 30 tablet 2   Sodium Bicarbonate-Citric Acid (ALKA-SELTZER HEARTBURN) 1940-1000 MG TBEF Take 1-2 tablets by mouth 2 (two) times daily as needed (heartburn/indigestion).     warfarin (COUMADIN) 5 MG tablet TAKE 1 AND 1/2 TABLETS BY MOUTH DAILY EXCEPT 1 TABLET ON TUESDAYS, THURSDAY AND SATURDAYS OR AS DIRECTED 45 tablet 5   No current facility-administered medications on file prior to visit.    No Known Allergies   Assessment/Plan:  1. Periprocedural anticoag: INR today 2.1. Below periprocedural anticoag instructions reviewed with pt and his  wife.  6/22: Last dose of warfarin.  6/23: Inject Lovenox 150mg  subcutaneously in the fatty tissue of the abdomen at 8pm, rotate injection sites. No warfarin.  6/24: Inject Lovenox 150mg  subcutaneously at 8pm. No warfarin.  6/25: Inject Lovenox 150mg  subcutaneously at 8pm. No warfarin.  6/26: Inject Lovenox 150mg  subcutaneously at 8pm. No warfarin.  6/27: No Lovenox, no warfarin.  6/28: Procedure Day - No Lovenox. Resume warfarin in the evening or as directed by doctor (take an extra half tablet with usual dose for 2 days then resume normal dose).  6/29: Resume Lovenox 150mg  at 8am and take warfarin.  6/30: Inject Lovenox 150mg  at 8am and take warfarin.  7/1: Inject Lovenox 150mg  at 8am and take warfarin.  7/2:Inject Lovenox 150mg  at 8am and take warfarin.  7/3: warfarin appt to check INR.   Mikyla Schachter E. Alroy Portela, PharmD, BCACP, CPP [facility] [address] [place], [place] [address] Phone: [phone]; Fax: [phone] 11/07/2022 8:58 AM

## 2022-11-07 NOTE — Patient Instructions (Addendum)
Medication Instructions:  Your physician recommends that you continue on your current medications as directed. Please refer to the Current Medication list given to you today.  *If you need a refill on your cardiac medications before your next appointment, please call your pharmacy*  Lab Work: TODAY: BMET and CBC  Testing/Procedures: Your physician has requested that you have an echocardiogram. Echocardiography is a painless test that uses sound waves to create images of your heart. It provides your doctor with information about the size and shape of your heart and how well your heart's chambers and valves are working. This procedure takes approximately one hour. There are no restrictions for this procedure. Please do NOT wear cologne, perfume, aftershave, or lotions (deodorant is allowed). Please arrive 15 minutes prior to your appointment time.  Your physician has requested that you have cardiac CT. Cardiac computed tomography (CT) is a painless test that uses an x-ray machine to take clear, detailed pictures of your heart. For further information please visit [email or web address] Please follow instruction sheet as given.  Lead extraction - see instruction sheet given to you today.   Follow-Up: At [facility], you and your health needs are our priority.  As part of our continuing mission to provide you with exceptional heart care, we have created designated Provider Care Teams.  These Care Teams include your primary Cardiologist (physician) and Advanced Practice Providers (APPs -  Physician Assistants and Nurse Practitioners) who all work together to provide you with the care you need, when you need it.  Your next appointment:   We will call you to schedule your follow up appointment

## 2022-11-07 NOTE — Patient Instructions (Addendum)
Your INR today is in range at 2.1. Take your warfarin today and tomorrow like normal.  6/22: Last dose of warfarin.  6/23: Inject Lovenox (enoxaparin) 150mg  subcutaneously in the fatty tissue of the abdomen at 8pm, rotate injection sites. No warfarin.  6/24: Inject Lovenox 150mg  subcutaneously at 8pm. No warfarin.  6/25: Inject Lovenox 150mg  subcutaneously at 8pm. No warfarin.  6/26: Inject Lovenox 150mg  subcutaneously at 8pm. No warfarin.  6/27: No Lovenox, no warfarin.  6/28: Procedure Day - No Lovenox. Resume warfarin in the evening or as directed by doctor (take an extra half tablet with usual dose for 2 days then resume normal dose).  6/29: Resume Lovenox 150mg  at 8am and take warfarin.  6/30: Inject Lovenox 150mg  at 8am and take warfarin.  7/1: Inject Lovenox 150mg  at 8am and take warfarin.  7/2:Inject Lovenox 150mg  at 8am and take warfarin.  7/3: warfarin appt to check INR.

## 2022-11-08 LAB — CBC WITH DIFFERENTIAL/PLATELET
Basophils Absolute: 0.1 10*3/uL (ref 0.0–0.2)
Basos: 1 %
EOS (ABSOLUTE): 0.3 10*3/uL (ref 0.0–0.4)
Eos: 6 %
Hematocrit: 37.3 % — ABNORMAL LOW (ref 37.5–51.0)
Hemoglobin: 12.2 g/dL — ABNORMAL LOW (ref 13.0–17.7)
Immature Grans (Abs): 0 10*3/uL (ref 0.0–0.1)
Immature Granulocytes: 0 %
Lymphocytes Absolute: 1.2 10*3/uL (ref 0.7–3.1)
Lymphs: 22 %
MCH: 31.2 pg (ref 26.6–33.0)
MCHC: 32.7 g/dL (ref 31.5–35.7)
MCV: 95 fL (ref 79–97)
Monocytes Absolute: 0.6 10*3/uL (ref 0.1–0.9)
Monocytes: 11 %
Neutrophils Absolute: 3.4 10*3/uL (ref 1.4–7.0)
Neutrophils: 60 %
Platelets: 121 10*3/uL — ABNORMAL LOW (ref 150–450)
RBC: 3.91 x10E6/uL — ABNORMAL LOW (ref 4.14–5.80)
RDW: 11.9 % (ref 11.6–15.4)
WBC: 5.6 10*3/uL (ref 3.4–10.8)

## 2022-11-08 LAB — BASIC METABOLIC PANEL
BUN/Creatinine Ratio: 14 (ref 10–24)
BUN: 13 mg/dL (ref 8–27)
CO2: 28 mmol/L (ref 20–29)
Calcium: 8.9 mg/dL (ref 8.6–10.2)
Chloride: 102 mmol/L (ref 96–106)
Creatinine, Ser: 0.94 mg/dL (ref 0.76–1.27)
Glucose: 144 mg/dL — ABNORMAL HIGH (ref 70–99)
Potassium: 4.3 mmol/L (ref 3.5–5.2)
Sodium: 142 mmol/L (ref 134–144)
eGFR: 90 mL/min/{1.73_m2} (ref >59)

## 2022-11-10 ENCOUNTER — Ambulatory Visit (HOSPITAL_BASED_OUTPATIENT_CLINIC_OR_DEPARTMENT_OTHER): Payer: Medicare PPO

## 2022-11-10 ENCOUNTER — Ambulatory Visit (HOSPITAL_COMMUNITY)
Admission: RE | Admit: 2022-11-10 | Discharge: 2022-11-10 | Disposition: A | Discharge status: Home / Self Care | Payer: Medicare PPO | Source: Ambulatory Visit | Attending: Cardiology | Admitting: Cardiology

## 2022-11-10 ENCOUNTER — Encounter: Payer: Self-pay | Admitting: Pharmacist

## 2022-11-10 DIAGNOSIS — I480 Paroxysmal atrial fibrillation: Secondary | ICD-10-CM | POA: Insufficient documentation

## 2022-11-10 DIAGNOSIS — Z950 Presence of cardiac pacemaker: Secondary | ICD-10-CM | POA: Diagnosis not present

## 2022-11-10 DIAGNOSIS — T82110A Breakdown (mechanical) of cardiac electrode, initial encounter: Secondary | ICD-10-CM | POA: Diagnosis present

## 2022-11-10 DIAGNOSIS — R011 Cardiac murmur, unspecified: Secondary | ICD-10-CM

## 2022-11-10 LAB — ECHOCARDIOGRAM COMPLETE
AR max vel: 1 cm2
AV Area VTI: 0.96 cm2
AV Area mean vel: 0.92 cm2
AV Mean grad: 14.6 mmHg
AV Peak grad: 25.2 mmHg
Ao pk vel: 2.51 m/s
Area-P 1/2: 2.64 cm2
S' Lateral: 5.1 cm

## 2022-11-10 MED ORDER — IOHEXOL 350 MG/ML SOLN
75.0000 mL | Freq: Once | INTRAVENOUS | Status: AC | PRN
  Administered 2022-11-10: 75 mL via INTRAVENOUS

## 2022-11-10 NOTE — Progress Notes (Signed)
IV started 20g R AC  Attempt x1 Pt tolerated well

## 2022-11-12 NOTE — Progress Notes (Signed)
Anesthesia Chart Review: Maury Dus  Case: [id number] Date/Time: 11/14/22 1330   Procedure: LEAD EXTRACTION   Anesthesia type: General   Pre-op diagnosis: lead malfunction   [facility] / [facility]   Providers: Lanier Prude, MD     BACK-UP CT SURGEON: Evelene Croon, MD   DISCUSSION: Patient is a 67 year old male scheduled for the above procedure. 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 Recently RV lead with significantly elevated RV capture threshold and exit block suspected. Per Dr. Lalla Brothers, "The lead was implanted in 2021 and is a [employer] tendril LPA 1200 M lead.  Given the dwell time, this will need to be removed and replaced in the operating room with cardiothoracic surgical backup.  I discussed the lead extraction procedure in detail including the risks and he wishes to proceed.  He will hold his Coumadin for 5 days prior to the procedure and bridge with Lovenox given his history of recurrent DVTs." CT scan and echo recommended prior to procedure. Based on results, he plans to upgrade to CRT-D at time of sytem revision.  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  Last warfarin 11/08/22, last Lovenox 11/12/22. He remains on ASA. He took one nitroglycerin on 11/11/22 but has not required any additional doses. Will send FYI message to Dr. Lalla Brothers and covering APP Keitha Butte. He is for T&S, Prepare PRBC on arrival.    VS:  BP Readings from Last 3 Encounters:  11/10/22 120/67   11/07/22 122/70  10/31/22 118/72   Pulse Readings from Last 3 Encounters:  11/07/22 60  10/31/22 73  08/07/22 76     PROVIDERS: Benita Stabile, MD is PCP Nona Dell, MD is cardiologist [name], MD is EP cardiologist Doreatha Massed, MD is HEM-ONC   LABS: Most recent lab results in [facility] include: Lab Results  Component Value Date   WBC 5.6 11/07/2022   HGB 12.2 (L) 11/07/2022   HCT 37.3 (L) 11/07/2022   PLT 121 (L) 11/07/2022   GLUCOSE 144 (H) 11/07/2022   NA 142 11/07/2022   K 4.3 11/07/2022   CL 102 11/07/2022   CREATININE 0.94 11/07/2022   BUN 13 11/07/2022   CO2 28 11/07/2022   INR 2.3 10/27/2022    IMAGES: CT Lead extraction w/ contrast 11/10/22: IMPRESSION: 1. The CIED leads are located centrally within the SVC. The CIED leads touch the wall of the SVC for >= 1 cm (RV lead). 2. A CIED lead is embedded within the RA wall without high-risk features for extraction. 3. A CIED lead is embedded within the RV apex with moderate-risk features for extraction, the CIED lead terminates beyond the RV wall, <5 mm beyond the RV wall. See screen captures in PACS. 4. No lead fractures or CIED lead thrombus detected. 5. Overall, these findings are moderate risk for CIED lead extraction.  CXR 10/31/22: IMPRESSION: 1. Stable position of the left chest wall cardiac device and associated leads. The leads are intact. 2. No focal consolidation or pleural effusion.   EKG: 10/31/22: "EKG today shows loss of  capture (NSR)"   CV: Echo 11/10/22: IMPRESSIONS   1. Left ventricular ejection fraction, by estimation, is 30 to 35%. The  left ventricle has moderately decreased function. The left ventricle  demonstrates global hypokinesis. The left ventricular internal cavity size  was mildly dilated. There is mild  left ventricular hypertrophy. Left ventricular diastolic parameters are  indeterminate.   2. Right ventricular systolic function is mildly reduced. The  right  ventricular size is mildly enlarged. There is normal pulmonary artery  systolic pressure. The estimated right ventricular systolic pressure is  21.3 mmHg.   3. The mitral valve is normal in structure. Mild mitral valve  regurgitation. No evidence of mitral stenosis.   4. The aortic valve is calcified. There is severe calcifcation of the  aortic valve. Aortic valve regurgitation is not visualized. Moderate to  severe aortic valve stenosis. Vmax 2.8 m/s, MG 18 mmHg, AVA 1.0 cm^2, DI  0.25. Low flow low gradient moderate  to severe AS   5. The inferior vena cava is normal in size with greater than 50%  respiratory variability, suggesting right atrial pressure of 3 mmHg.    Nuclear stress test 05/16/20: Eugenie Birks stress is electrically nondiagnostic due to pacing Myovue scan shows mild inferior defect consistent with scar and possible soft tissue attenuation No significant ischemia LVEF 42% septal hypkinesis Overall intermediate risk scan No signficant change from study in 2013   Cardiac cath 06/29/20: 1.  Severe native three-vessel coronary artery disease with severe stenosis of the proximal LAD and associated heavy calcification, chronic total occlusion of the ostial RCA, and moderately severe left circumflex stenosis 2.  Status post aortocoronary bypass surgery with continued patency of the LIMA to LAD graft, saphenous vein graft to second obtuse marginal, and saphenous vein graft to right PDA. 3.  Normal LVEDP   Recommendations: The patient has patency of all bypass conduits now 4 years out from coronary bypass surgery.  He has distal vessel disease, not amenable to PCI.  Recommend ongoing medical therapy/increase antianginal program.  Will add low-dose isosorbide as tolerated.   US Carotid 11/07/16: Summary:  Findings suggest 1-39% internal carotid artery stenosis  bilaterally. Vertebral arteries are patent with antegrade flow.     Past Medical History:  Diagnosis Date    Anemia    Anxiety    Aortic stenosis    Arthritis    CHF (congestive heart failure) (HCC)    Chronic pain    Cirrhosis (HCC)    Coronary atherosclerosis of native coronary artery    BMS LAD and PTCA RCA 2000,BMS RCA/PLA 2001, CABG June 2018   Depression    Diverticulitis    Essential hypertension    Fatty liver    Gastric ulcer    GERD (gastroesophageal reflux disease)    Hyperlipidemia    Low-grade NHL (non-Hodgkin's lymphoma) 2007   Lupus anticoagulant positive    MI (myocardial infarction) (HCC) 2001   Pacemaker    Paresthesia of upper limb    Peripheral neuropathy    Peripheral vascular disease (HCC)    blood clots in legs   Thrombocytopenia (HCC)    Trifascicular block    Type 2 diabetes mellitus (HCC)    Umbilical hernia     Past Surgical History:  Procedure Laterality Date   Axillary abscess     left incision and drainage left   BACK SURGERY     multiple   BIOPSY  08/15/2014   Procedure: BIOPSY;  Surgeon: West Bali, MD;  [facility];  Service: Endoscopy;;  Gastric   BIOPSY  01/19/2015   Procedure: BIOPSY (GASTRIC ULCER);  Surgeon: West Bali, MD;  Location: [facility];  Service: Endoscopy;;   CARDIAC CATHETERIZATION     cardiac stents     x5 stents   COLONOSCOPY  June 2008   Dr. Tinnie Gens Medoff: Mild left colonic diverticulosis   CORONARY ARTERY BYPASS GRAFT N/A 11/11/2016   Procedure: CORONARY ARTERY BYPASS GRAFTING (CABG) x 3, USING LEFT MAMMARY ARTERY AND RIGHT GREATER SAPHENOUS VEIN HARVESTED ENDOSCOPICALLY;  Surgeon: Kerin Perna, MD;  Location: [facility] OR;  Service: Open Heart Surgery;  Laterality: N/A;   CORONARY PRESSURE/FFR STUDY N/A 09/26/2016   Procedure: Intravascular Pressure Wire/FFR Study;  Surgeon: Yvonne Kendall, MD;  [facility];  Service: Cardiovascular;  Laterality: N/A;   Dental extractions     ESOPHAGOGASTRODUODENOSCOPY  June 2008   Dr. Tinnie Gens Medoff: Gastric ulcer, antral, biopsy with reactive changes associated  glandular atrophy, no H pylori. No features of lymphoma. Likely NSAID related   ESOPHAGOGASTRODUODENOSCOPY  August 2008   Dr. Tinnie Gens Medoff: Complete healing of gastric ulcers.   ESOPHAGOGASTRODUODENOSCOPY (EGD) WITH PROPOFOL N/A 08/15/2014   SLF: 1. Abnormal pain due to ulcers & gastriitis    ESOPHAGOGASTRODUODENOSCOPY (EGD) WITH PROPOFOL N/A 01/19/2015   SLF: 1. Persistant ulcer with firm base 2. single gastric polyp found in the gastric antrum. Ulcer base    EUS N/A 03/21/2015   Thickened gastric wall in pre-pyloric antrum, no obvious intramural mass, fatty pancreas, no obvious pancreatic mass.    Inguinal lymph node biopsy     right inguinal lymph node biopsy   LEFT HEART CATH AND CORONARY ANGIOGRAPHY N/A 09/26/2016   Procedure: Left Heart Cath and Coronary Angiography;  Surgeon: Yvonne Kendall, MD;  [facility];  Service: Cardiovascular;  Laterality: N/A;   LEFT HEART CATH AND CORS/GRAFTS ANGIOGRAPHY N/A 06/29/2020   Procedure: LEFT HEART CATH AND CORS/GRAFTS ANGIOGRAPHY;  Surgeon: Tonny Bollman, MD;  Location: [facility] INVASIVE CV LAB;  Service: Cardiovascular;  Laterality: N/A;   NECK SURGERY     PACEMAKER IMPLANT N/A 03/12/2020   Procedure: PACEMAKER IMPLANT;  Surgeon: Lanier Prude, MD;  [facility];  Service: Cardiovascular;  Laterality: N/A;   POLYPECTOMY  01/19/2015   Procedure: POLYPECTOMY (GASTRIC);  Surgeon: West Bali, MD;  Location: [facility];  Service: Endoscopy;;   ROUX-EN-Y GASTRIC BYPASS     RNY gastric bypass   SHOULDER ARTHROSCOPY Left    TEE WITHOUT CARDIOVERSION N/A 11/11/2016   Procedure: TRANSESOPHAGEAL ECHOCARDIOGRAM (TEE);  Surgeon: Donata Clay, Theron Arista, MD;  Location: [facility] OR;  Service: Open Heart Surgery;  Laterality: N/A;    MEDICATIONS: No current facility-administered medications for this encounter.    acetaminophen (TYLENOL) 500 MG tablet   amLODipine (NORVASC) 2.5 MG tablet   Ascorbic Acid (VITAMIN C) 500 MG CAPS   aspirin  EC 81 MG tablet   B Complex-C (B-COMPLEX WITH VITAMIN C) tablet   cetirizine (ZYRTEC) 10 MG tablet   Cholecalciferol (VITAMIN D3) 50 MCG (2000 UT) TABS   enoxaparin (LOVENOX) 150 MG/ML injection   ferrous sulfate 325 (65 FE) MG tablet   FLUoxetine (PROZAC) 20 MG capsule   gabapentin (NEURONTIN) 600 MG tablet   Insulin Glargine (BASAGLAR KWIKPEN) 100 UNIT/ML SOPN   isosorbide mononitrate (IMDUR) 30 MG 24 hr tablet   Magnesium 250 MG TABS   Menthol-Methyl Salicylate (MUSCLE RUB) 10-15 % CREA   Multiple Vitamin (MULTIVITAMIN WITH  MINERALS) TABS tablet   nitroGLYCERIN (NITROSTAT) 0.4 MG SL tablet   Oxycodone HCl 10 MG TABS   pantoprazole (PROTONIX) 40 MG tablet   Pediatric Multivitamins-Iron (FLINTSTONES COMPLETE PO)   simvastatin (ZOCOR) 10 MG tablet   Sodium Bicarbonate-Citric Acid (ALKA-SELTZER HEARTBURN) 1940-1000 MG TBEF   glucose blood (ONETOUCH VERIO) test strip   ONETOUCH VERIO test strip   warfarin (COUMADIN) 5 MG tablet    Shonna Chock, PA-C Surgical Short Stay/Anesthesiology [facility] Phone [phone] [facility] Phone [phone] 11/13/2022 11:01 AM

## 2022-11-13 ENCOUNTER — Other Ambulatory Visit: Payer: Self-pay

## 2022-11-13 ENCOUNTER — Encounter (HOSPITAL_COMMUNITY): Payer: Self-pay | Admitting: Cardiology

## 2022-11-13 NOTE — Progress Notes (Signed)
patient's wife voiced understanding of new arrival time of 3 tomorrow

## 2022-11-13 NOTE — Progress Notes (Signed)
Spoke with pt's wife, Joyce Gross for pre-op call. Pt has extensive cardiac history with CAD, MI and has a pacemaker. She states pt took a NTG on 11/11/22. She states he only took one and has not had any other chest pain. Pt is on Coumadin, last dose 11/08/22, was taking Lovenox - last dose was 11/12/22. Has continued his Aspirin. Pt has a Scientist, research (life sciences).   Pt is a type 2 diabetic. Joyce Gross states pt's most recent CBG's are between 20-109. Veto Kemps to have pt take 1/2 of his regular dose of Basaglar insulin tonight. He takes between 50-80 units in the evening. Veto Kemps to have pt check his blood sugar in the AM and every 2 hours until he leaves for the hospital. If blood sugar is 70 or below, treat with 1/2 cup of clear juice (apple or cranberry) and recheck blood sugar 15 minutes after drinking juice. If blood sugar continues to be 70 or below, call the Short Stay department and ask to speak to a nurse. Joyce Gross voiced understanding.   Joyce Gross states pt has not had any Covid symptoms.   Pt was given CHG soap and instructions from Dr. Glenetta Hew office.

## 2022-11-13 NOTE — Anesthesia Preprocedure Evaluation (Addendum)
Anesthesia Evaluation  Patient identified by MRN, date of birth, ID band Patient awake    Reviewed: Allergy & Precautions, NPO status , Patient's Chart, lab work & pertinent test results  History of Anesthesia Complications Negative for: history of anesthetic complications  Airway Mallampati: III  TM Distance: >3 FB Neck ROM: Full    Dental  (+) Dental Advisory Given   Pulmonary neg shortness of breath, neg COPD, neg recent URI, former smoker   breath sounds clear to auscultation       Cardiovascular hypertension, + CAD, + Past MI, + Cardiac Stents, + CABG, + Peripheral Vascular Disease and +CHF  + dysrhythmias + pacemaker  Rhythm:Regular     Neuro/Psych  PSYCHIATRIC DISORDERS Anxiety Depression     Neuromuscular disease    GI/Hepatic Neg liver ROS, PUD,GERD  ,,  Endo/Other  diabetes    Renal/GU      Musculoskeletal  (+) Arthritis ,    Abdominal   Peds  Hematology  (+) Blood dyscrasia, anemia   Anesthesia Other Findings   Reproductive/Obstetrics                              Anesthesia Physical Anesthesia Plan  ASA: 3  Anesthesia Plan: General   Post-op Pain Management: Minimal or no pain anticipated   Induction: Intravenous  PONV Risk Score and Plan: 1 and Ondansetron, Treatment may vary due to age or medical condition and Dexamethasone  Airway Management Planned: Oral ETT  Additional Equipment: Arterial line and TEE  Intra-op Plan:   Post-operative Plan: Extubation in OR  Informed Consent: I have reviewed the patients History and Physical, chart, labs and discussed the procedure including the risks, benefits and alternatives for the proposed anesthesia with the patient or authorized representative who has indicated his/her understanding and acceptance.     Dental advisory given  Plan Discussed with: CRNA  Anesthesia Plan Comments: (PAT note written 11/13/2022 by Shonna Chock, PA-C.  )        Anesthesia Quick Evaluation

## 2022-11-14 ENCOUNTER — Encounter (HOSPITAL_COMMUNITY)
Admission: RE | Disposition: A | Discharge status: Home / Self Care | Payer: Self-pay | Source: Home / Self Care | Attending: Cardiology

## 2022-11-14 ENCOUNTER — Other Ambulatory Visit: Payer: Self-pay

## 2022-11-14 ENCOUNTER — Inpatient Hospital Stay (HOSPITAL_COMMUNITY): Payer: Medicare PPO | Admitting: Vascular Surgery

## 2022-11-14 ENCOUNTER — Inpatient Hospital Stay (HOSPITAL_COMMUNITY): Payer: Medicare PPO

## 2022-11-14 ENCOUNTER — Ambulatory Visit (HOSPITAL_COMMUNITY)
Admission: RE | Admit: 2022-11-14 | Discharge: 2022-11-15 | Disposition: A | Discharge status: Home / Self Care | Payer: Medicare PPO | Attending: Cardiology | Admitting: Cardiology

## 2022-11-14 ENCOUNTER — Inpatient Hospital Stay (HOSPITAL_BASED_OUTPATIENT_CLINIC_OR_DEPARTMENT_OTHER): Payer: Medicare PPO | Admitting: Vascular Surgery

## 2022-11-14 DIAGNOSIS — E119 Type 2 diabetes mellitus without complications: Secondary | ICD-10-CM | POA: Insufficient documentation

## 2022-11-14 DIAGNOSIS — Z1152 Encounter for screening for COVID-19: Secondary | ICD-10-CM | POA: Diagnosis not present

## 2022-11-14 DIAGNOSIS — I442 Atrioventricular block, complete: Secondary | ICD-10-CM

## 2022-11-14 DIAGNOSIS — I5022 Chronic systolic (congestive) heart failure: Secondary | ICD-10-CM | POA: Insufficient documentation

## 2022-11-14 DIAGNOSIS — Z950 Presence of cardiac pacemaker: Principal | ICD-10-CM

## 2022-11-14 DIAGNOSIS — Z7901 Long term (current) use of anticoagulants: Secondary | ICD-10-CM | POA: Diagnosis not present

## 2022-11-14 DIAGNOSIS — Y831 Surgical operation with implant of artificial internal device as the cause of abnormal reaction of the patient, or of later complication, without mention of misadventure at the time of the procedure: Secondary | ICD-10-CM | POA: Insufficient documentation

## 2022-11-14 DIAGNOSIS — T82118A Breakdown (mechanical) of other cardiac electronic device, initial encounter: Secondary | ICD-10-CM

## 2022-11-14 DIAGNOSIS — I480 Paroxysmal atrial fibrillation: Secondary | ICD-10-CM | POA: Diagnosis not present

## 2022-11-14 DIAGNOSIS — T82110A Breakdown (mechanical) of cardiac electrode, initial encounter: Secondary | ICD-10-CM | POA: Insufficient documentation

## 2022-11-14 DIAGNOSIS — Z87891 Personal history of nicotine dependence: Secondary | ICD-10-CM

## 2022-11-14 DIAGNOSIS — Z86718 Personal history of other venous thrombosis and embolism: Secondary | ICD-10-CM | POA: Diagnosis not present

## 2022-11-14 DIAGNOSIS — Z951 Presence of aortocoronary bypass graft: Secondary | ICD-10-CM | POA: Insufficient documentation

## 2022-11-14 DIAGNOSIS — I2510 Atherosclerotic heart disease of native coronary artery without angina pectoris: Secondary | ICD-10-CM | POA: Insufficient documentation

## 2022-11-14 DIAGNOSIS — Z794 Long term (current) use of insulin: Secondary | ICD-10-CM | POA: Insufficient documentation

## 2022-11-14 DIAGNOSIS — Z95810 Presence of automatic (implantable) cardiac defibrillator: Secondary | ICD-10-CM

## 2022-11-14 DIAGNOSIS — I10 Essential (primary) hypertension: Secondary | ICD-10-CM

## 2022-11-14 HISTORY — DX: Nonrheumatic aortic (valve) stenosis: I35.0

## 2022-11-14 HISTORY — PX: LEAD EXTRACTION: EP1211

## 2022-11-14 HISTORY — DX: Anemia, unspecified: D64.9

## 2022-11-14 HISTORY — DX: Heart failure, unspecified: I50.9

## 2022-11-14 HISTORY — DX: Depression, unspecified: F32.A

## 2022-11-14 HISTORY — DX: Peripheral vascular disease, unspecified: I73.9

## 2022-11-14 LAB — ECHO INTRAOPERATIVE TEE
Height: 76 in
Weight: 3408 oz

## 2022-11-14 LAB — BPAM RBC
Blood Product Expiration Date: 202407052359
ISSUE DATE / TIME: 202406280726
ISSUE DATE / TIME: 202406280726

## 2022-11-14 LAB — TYPE AND SCREEN
ABO/RH(D): A POS
Unit division: 0
Unit division: 0
Unit division: 0

## 2022-11-14 LAB — GLUCOSE, CAPILLARY
Glucose-Capillary: 142 mg/dL — ABNORMAL HIGH (ref 70–99)
Glucose-Capillary: 114 mg/dL — ABNORMAL HIGH (ref 70–99)
Glucose-Capillary: 118 mg/dL — ABNORMAL HIGH (ref 70–99)
Glucose-Capillary: 121 mg/dL — ABNORMAL HIGH (ref 70–99)
Glucose-Capillary: 131 mg/dL — ABNORMAL HIGH (ref 70–99)
Glucose-Capillary: 200 mg/dL — ABNORMAL HIGH (ref 70–99)
Glucose-Capillary: 218 mg/dL — ABNORMAL HIGH (ref 70–99)

## 2022-11-14 LAB — PREPARE RBC (CROSSMATCH)

## 2022-11-14 LAB — PROTIME-INR
INR: 1.2 (ref 0.8–1.2)
Prothrombin Time: 15.8 seconds — ABNORMAL HIGH (ref 11.4–15.2)

## 2022-11-14 LAB — SARS CORONAVIRUS 2 BY RT PCR: SARS Coronavirus 2 by RT PCR: NEGATIVE

## 2022-11-14 LAB — SURGICAL PCR SCREEN
MRSA, PCR: NEGATIVE
Staphylococcus aureus: NEGATIVE

## 2022-11-14 SURGERY — LEAD EXTRACTION
Anesthesia: General

## 2022-11-14 MED ORDER — GABAPENTIN 300 MG PO CAPS: 600.0000 mg | ORAL_CAPSULE | Freq: Every day | ORAL | Status: DC

## 2022-11-14 MED ORDER — SUGAMMADEX SODIUM 200 MG/2ML IV SOLN
INTRAVENOUS | Status: DC | PRN
  Administered 2022-11-14: 193.2 mg via INTRAVENOUS

## 2022-11-14 MED ORDER — SODIUM CHLORIDE 0.9 % IV SOLN
INTRAVENOUS | Status: AC
  Filled 2022-11-14: qty 2

## 2022-11-14 MED ORDER — OXYCODONE HCL 5 MG PO TABS
10.0000 mg | ORAL_TABLET | Freq: Two times a day (BID) | ORAL | Status: DC | PRN
  Administered 2022-11-14 (×2): 10 mg via ORAL
  Filled 2022-11-14 (×2): qty 2

## 2022-11-14 MED ORDER — CHLORHEXIDINE GLUCONATE 0.12 % MT SOLN
OROMUCOSAL | Status: AC
  Administered 2022-11-14: 15 mL via OROMUCOSAL
  Filled 2022-11-14: qty 15

## 2022-11-14 MED ORDER — ONDANSETRON HCL 4 MG/2ML IJ SOLN: 4.0000 mg | Freq: Four times a day (QID) | INTRAMUSCULAR | Status: DC | PRN

## 2022-11-14 MED ORDER — HEPARIN (PORCINE) IN NACL 1000-0.9 UT/500ML-% IV SOLN
INTRAVENOUS | Status: DC | PRN
  Administered 2022-11-14: 500 mL

## 2022-11-14 MED ORDER — CEFAZOLIN SODIUM-DEXTROSE 1-4 GM/50ML-% IV SOLN
1.0000 g | Freq: Four times a day (QID) | INTRAVENOUS | Status: AC
  Administered 2022-11-14 – 2022-11-15 (×3): 1 g via INTRAVENOUS
  Filled 2022-11-14 (×3): qty 50

## 2022-11-14 MED ORDER — SODIUM CHLORIDE 0.9 % IV SOLN: INTRAVENOUS | Status: DC

## 2022-11-14 MED ORDER — CHLORHEXIDINE GLUCONATE 0.12 % MT SOLN: 15.0000 mL | Freq: Once | OROMUCOSAL | Status: AC

## 2022-11-14 MED ORDER — MIDAZOLAM HCL 2 MG/2ML IJ SOLN
INTRAMUSCULAR | Status: DC | PRN
  Administered 2022-11-14: 1 mg via INTRAVENOUS

## 2022-11-14 MED ORDER — ROCURONIUM BROMIDE 10 MG/ML (PF) SYRINGE
PREFILLED_SYRINGE | INTRAVENOUS | Status: DC | PRN
  Administered 2022-11-14 (×2): 20 mg via INTRAVENOUS
  Administered 2022-11-14: 30 mg via INTRAVENOUS
  Administered 2022-11-14: 80 mg via INTRAVENOUS

## 2022-11-14 MED ORDER — INSULIN GLARGINE-YFGN 100 UNIT/ML ~~LOC~~ SOLN
40.0000 [IU] | Freq: Every day | SUBCUTANEOUS | Status: DC
  Administered 2022-11-14: 40 [IU] via SUBCUTANEOUS
  Filled 2022-11-14 (×2): qty 0.4

## 2022-11-14 MED ORDER — ONDANSETRON HCL 4 MG/2ML IJ SOLN
INTRAMUSCULAR | Status: DC | PRN
  Administered 2022-11-14: 4 mg via INTRAVENOUS

## 2022-11-14 MED ORDER — SODIUM CHLORIDE 0.9% IV SOLUTION: Freq: Once | INTRAVENOUS | Status: DC

## 2022-11-14 MED ORDER — INSULIN ASPART 100 UNIT/ML IJ SOLN
INTRAMUSCULAR | Status: AC
  Administered 2022-11-14: 2 [IU] via SUBCUTANEOUS
  Filled 2022-11-14: qty 1

## 2022-11-14 MED ORDER — FLUOXETINE HCL 20 MG PO CAPS
20.0000 mg | ORAL_CAPSULE | Freq: Every day | ORAL | Status: DC
  Administered 2022-11-15: 20 mg via ORAL
  Filled 2022-11-14: qty 1

## 2022-11-14 MED ORDER — ACETAMINOPHEN 325 MG PO TABS
325.0000 mg | ORAL_TABLET | ORAL | Status: DC | PRN
  Administered 2022-11-14: 650 mg via ORAL
  Filled 2022-11-14: qty 2

## 2022-11-14 MED ORDER — FENTANYL CITRATE (PF) 100 MCG/2ML IJ SOLN
INTRAMUSCULAR | Status: DC | PRN
  Administered 2022-11-14 (×2): 50 ug via INTRAVENOUS

## 2022-11-14 MED ORDER — ISOSORBIDE MONONITRATE ER 60 MG PO TB24: 60.0000 mg | ORAL_TABLET | Freq: Every day | ORAL | Status: DC

## 2022-11-14 MED ORDER — GABAPENTIN 600 MG PO TABS: 600.0000 mg | ORAL_TABLET | Freq: Every day | ORAL | Status: DC

## 2022-11-14 MED ORDER — LIDOCAINE 2% (20 MG/ML) 5 ML SYRINGE
INTRAMUSCULAR | Status: DC | PRN
  Administered 2022-11-14: 60 mg via INTRAVENOUS

## 2022-11-14 MED ORDER — CEFAZOLIN SODIUM-DEXTROSE 2-4 GM/100ML-% IV SOLN
INTRAVENOUS | Status: AC
  Filled 2022-11-14: qty 100

## 2022-11-14 MED ORDER — PHENYLEPHRINE HCL-NACL 20-0.9 MG/250ML-% IV SOLN
INTRAVENOUS | Status: DC | PRN
  Administered 2022-11-14: 25 ug/min via INTRAVENOUS

## 2022-11-14 MED ORDER — LACTATED RINGERS IV SOLN: INTRAVENOUS | Status: DC | PRN

## 2022-11-14 MED ORDER — INSULIN ASPART 100 UNIT/ML IJ SOLN
0.0000 [IU] | Freq: Three times a day (TID) | INTRAMUSCULAR | Status: DC
  Administered 2022-11-14: 3 [IU] via SUBCUTANEOUS
  Administered 2022-11-14: 2 [IU] via SUBCUTANEOUS
  Administered 2022-11-15: 5 [IU] via SUBCUTANEOUS

## 2022-11-14 MED ORDER — ISOSORBIDE MONONITRATE ER 60 MG PO TB24
60.0000 mg | ORAL_TABLET | Freq: Every day | ORAL | Status: DC
  Administered 2022-11-15: 60 mg via ORAL
  Filled 2022-11-14: qty 1

## 2022-11-14 MED ORDER — SODIUM CHLORIDE 0.9 % IV SOLN
80.0000 mg | INTRAVENOUS | Status: AC
  Administered 2022-11-14: 80 mg

## 2022-11-14 MED ORDER — IOHEXOL 350 MG/ML SOLN
INTRAVENOUS | Status: DC | PRN
  Administered 2022-11-14: 15 mL
  Administered 2022-11-14: 5 mL
  Administered 2022-11-14 (×2): 10 mL

## 2022-11-14 MED ORDER — CEFAZOLIN SODIUM-DEXTROSE 2-4 GM/100ML-% IV SOLN
2.0000 g | INTRAVENOUS | Status: AC
  Administered 2022-11-14: 2 g via INTRAVENOUS
  Filled 2022-11-14: qty 100

## 2022-11-14 MED ORDER — CHLORHEXIDINE GLUCONATE 4 % EX SOLN: 4.0000 | Freq: Once | CUTANEOUS | Status: DC

## 2022-11-14 MED ORDER — LACTATED RINGERS IV SOLN: INTRAVENOUS | Status: DC

## 2022-11-14 MED ORDER — INSULIN ASPART 100 UNIT/ML IJ SOLN: 0.0000 [IU] | INTRAMUSCULAR | Status: DC | PRN

## 2022-11-14 MED ORDER — PHENYLEPHRINE 80 MCG/ML (10ML) SYRINGE FOR IV PUSH (FOR BLOOD PRESSURE SUPPORT)
PREFILLED_SYRINGE | INTRAVENOUS | Status: DC | PRN
  Administered 2022-11-14 (×2): 80 ug via INTRAVENOUS

## 2022-11-14 MED ORDER — POVIDONE-IODINE 10 % EX SWAB
2.0000 | Freq: Once | CUTANEOUS | Status: AC
  Administered 2022-11-14: 2 via TOPICAL

## 2022-11-14 MED ORDER — GABAPENTIN 300 MG PO CAPS
600.0000 mg | ORAL_CAPSULE | Freq: Every day | ORAL | Status: DC
  Administered 2022-11-14: 600 mg via ORAL
  Filled 2022-11-14: qty 2

## 2022-11-14 MED ORDER — PROPOFOL 10 MG/ML IV BOLUS
INTRAVENOUS | Status: DC | PRN
  Administered 2022-11-14: 40 mg via INTRAVENOUS
  Administered 2022-11-14: 140 mg via INTRAVENOUS

## 2022-11-14 MED ORDER — OXYCODONE HCL 5 MG PO TABS
ORAL_TABLET | ORAL | Status: AC
  Filled 2022-11-14: qty 1

## 2022-11-14 MED ORDER — SIMVASTATIN 5 MG PO TABS
10.0000 mg | ORAL_TABLET | Freq: Every day | ORAL | Status: DC
  Administered 2022-11-14: 10 mg via ORAL
  Filled 2022-11-14: qty 2

## 2022-11-14 MED ORDER — AMLODIPINE BESYLATE 5 MG PO TABS
2.5000 mg | ORAL_TABLET | Freq: Every day | ORAL | Status: DC
  Administered 2022-11-15: 2.5 mg via ORAL
  Filled 2022-11-14: qty 1

## 2022-11-14 SURGICAL SUPPLY — 24 items
BALLN COR SINUS VENO 6FR 80 (BALLOONS) ×1
BALLOON COR SINUS VENO 6FR 80 (BALLOONS) IMPLANT
CABLE ADAPT PACING TEMP 12FT (ADAPTER) IMPLANT
CATH CPS DIRECT 135 DS2C020 (CATHETERS) IMPLANT
CATH CPS LOCATOR 3D MED (CATHETERS) IMPLANT
CATH CPS QUART SUB DS2N027-59 (CATHETERS) IMPLANT
CATH S G BIP PACING (CATHETERS) IMPLANT
HELIX LOCKING TOOL (MISCELLANEOUS) ×1
ICD UNIFY ASUR CRT CD3357-40Q (ICD Generator) IMPLANT
LEAD DURATA 7122Q-65CM (Lead) IMPLANT
LEAD QUARTET 1458Q-86CM (Lead) IMPLANT
LEAD ULTIPACE 65 LPA1231/65 (Lead) IMPLANT
POUCH AIGIS-R ANTIBACT ICD (Mesh General) ×1 IMPLANT
POUCH AIGIS-R ANTIBACT ICD LRG (Mesh General) IMPLANT
SHEATH 7FR PRELUDE SNAP 13 (SHEATH) IMPLANT
SHEATH 8FR PRELUDE SNAP 13 (SHEATH) IMPLANT
SHEATH 9.5FR PRELUDE SNAP 13 (SHEATH) IMPLANT
SHEATH PINNACLE 6F 10CM (SHEATH) IMPLANT
SHEATH WORLEY 9FR 62CM (SHEATH) IMPLANT
SLITTER AGILIS HISPRO (INSTRUMENTS) IMPLANT
TOOL HELIX LOCKING (MISCELLANEOUS) IMPLANT
WIRE ACUITY WHISPER EDS 4648 (WIRE) IMPLANT
WIRE HI TORQ VERSACORE-J 145CM (WIRE) IMPLANT
WIRE MAILMAN 182CM (WIRE) IMPLANT

## 2022-11-14 NOTE — Anesthesia Procedure Notes (Signed)
Procedure Name: Intubation Date/Time: 11/14/2022 8:09 AM  Performed by: Marena Chancy, CRNAPre-anesthesia Checklist: Patient identified, Emergency Drugs available, Suction available and Patient being monitored Patient Re-evaluated:Patient Re-evaluated prior to induction Oxygen Delivery Method: Circle System Utilized Preoxygenation: Pre-oxygenation with 100% oxygen Induction Type: IV induction Ventilation: Mask ventilation without difficulty Laryngoscope Size: Mac and 4 Grade View: Grade I Tube type: Oral Tube size: 8.0 mm Number of attempts: 1 Airway Equipment and Method: Stylet and Oral airway Placement Confirmation: ETT inserted through vocal cords under direct vision, positive ETCO2 and breath sounds checked- equal and bilateral Tube secured with: Tape Dental Injury: Teeth and Oropharynx as per pre-operative assessment

## 2022-11-14 NOTE — Plan of Care (Signed)
  Problem: Cardiac: Goal: Ability to achieve and maintain adequate cardiopulmonary perfusion will improve Outcome: Progressing   Problem: Education: Goal: Knowledge of General Education information will improve Description: Including pain rating scale, medication(s)/side effects and non-pharmacologic comfort measures Outcome: Progressing   Problem: Health Behavior/Discharge Planning: Goal: Ability to manage health-related needs will improve Outcome: Progressing    Problem: Pain Managment: Goal: General experience of comfort will improve Outcome: Progressing   Problem: Safety: Goal: Ability to remain free from injury will improve Outcome: Progressing   Problem: Skin Integrity: Goal: Risk for impaired skin integrity will decrease Outcome: Progressing

## 2022-11-14 NOTE — Progress Notes (Signed)
Home insulin regime reviewed with pharmacy (50-80units Glargine pending on BS) Recommends while here mod sliding scale and semglee 40u at HS  Francis Dowse, PA-C

## 2022-11-14 NOTE — Transfer of Care (Signed)
Immediate Anesthesia Transfer of Care Note  Patient: Kelly [name]  Procedure(s) Performed: LEAD EXTRACTION  Patient Location: Cath Lab  Anesthesia Type:General  Level of Consciousness: awake, alert , and oriented  Airway & Oxygen Therapy: Patient Spontanous Breathing and Patient connected to nasal cannula oxygen  Post-op Assessment: Report given to RN and Post -op Vital signs reviewed and stable  Post vital signs: Reviewed and stable  Last Vitals:  Vitals Value Taken Time  BP 143/68 11/14/22 1200  Temp 36.7 C 11/14/22 1147  Pulse 62 11/14/22 1201  Resp 7 11/14/22 1201  SpO2 98 % 11/14/22 1201  Vitals shown include unvalidated device data.  Last Pain:  Vitals:   11/14/22 1147  TempSrc: Temporal  PainSc: 0-No pain         Complications: No notable events documented.

## 2022-11-14 NOTE — Discharge Instructions (Addendum)
HOLD coumadin/warfarin today, restart tomorrow, June 30.  After Your ICD (Implantable Cardiac Defibrillator)   You have a [employer] ICD  ACTIVITY Do not lift your arm above shoulder height for 1 week after your procedure. After 7 days, you may progress as below.  You should remove your sling 24 hours after your procedure, unless otherwise instructed by your provider.     Friday November 21, 2022  Saturday November 22, 2022 Sunday November 23, 2022 Monday November 24, 2022   Do not lift, push, pull, or carry anything over 10 pounds with the affected arm until 6 weeks (Friday December 26, 2022 ) after your procedure.   You may drive AFTER your wound check, unless you have been told otherwise by your provider.   Ask your healthcare provider when you can go back to work   INCISION/Dressing If you are on a blood thinner such as Coumadin, Xarelto, Eliquis, Plavix, or Pradaxa please confirm with your provider when this should be resumed.   If large square, outer bandage is left in place, this can be removed after 24 hours from your procedure. Do not remove steri-strips or glue as below.   Monitor your defibrillator site for redness, swelling, and drainage. Call the device clinic at [phone] if you experience these symptoms or fever/chills.  If your incision is sealed with Steri-strips or staples, you may shower 7 days after your procedure or when told by your provider. Do not remove the steri-strips or let the shower hit directly on your site. You may wash around your site with soap and water.    If you were discharged in a sling, please do not wear this during the day more than 48 hours after your surgery unless otherwise instructed. This may increase the risk of stiffness and soreness in your shoulder.   Avoid lotions, ointments, or perfumes over your incision until it is well-healed.  You may use a hot tub or a pool AFTER your wound check appointment if the incision is completely closed.  Your ICD is  designed to protect you from life threatening heart rhythms. Because of this, you may receive a shock.   1 shock with no symptoms:  Call the office during business hours. 1 shock with symptoms (chest pain, chest pressure, dizziness, lightheadedness, shortness of breath, overall feeling unwell):  Call 911. If you experience 2 or more shocks in 24 hours:  Call 911. If you receive a shock, you should not drive for 6 months per the [place] DMV IF you receive appropriate therapy from your ICD.   ICD Alerts:  Some alerts are vibratory and others beep. These are NOT emergencies. Please call our office to let us know. If this occurs at night or on weekends, it can wait until the next business day. Send a remote transmission.  If your device is capable of reading fluid status (for heart failure), you will be offered monthly monitoring to review this with you.   DEVICE MANAGEMENT Remote monitoring is used to monitor your ICD from home. This monitoring is scheduled every 91 days by our office. It allows [place] to keep an eye on the functioning of your device to ensure it is working properly. You will routinely see your Electrophysiologist annually (more often if necessary).   You should receive your ID card for your new device in 4-8 weeks. Keep this card with you at all times once received. Consider wearing a medical alert bracelet or necklace.  Your ICD  may be MRI  compatible. This will be discussed at your next office visit/wound check.  You should avoid contact with strong electric or magnetic fields.   Do not use amateur (ham) radio equipment or electric (arc) welding torches. MP3 player headphones with magnets should not be used. Some devices are safe to use if held at least 12 inches (30 cm) from your defibrillator. These include power tools, lawn mowers, and speakers. If you are unsure if something is safe to use, ask your health care provider.  When using your cell phone, hold it to the ear that is on the  opposite side from the defibrillator. Do not leave your cell phone in a pocket over the defibrillator.  You may safely use electric blankets, heating pads, computers, and microwave ovens.  Call the office right away if: You have chest pain. You feel more than one shock. You feel more short of breath than you have felt before. You feel more light-headed than you have felt before. Your incision starts to open up.  This information is not intended to replace advice given to you by your health care provider. Make sure you discuss any questions you have with your health care provider.

## 2022-11-14 NOTE — Anesthesia Procedure Notes (Signed)
Arterial Line Insertion Start/End6/28/2024 7:05 AM Performed by: Dorie Rank, CRNA, CRNA  Patient location: Pre-op. Preanesthetic checklist: patient identified, IV checked, site marked, risks and benefits discussed, surgical consent, monitors and equipment checked, pre-op evaluation, timeout performed and anesthesia consent Lidocaine 1% used for infiltration Right was placed Catheter size: 20 G Hand hygiene performed  and maximum sterile barriers used   Attempts: 2 Procedure performed without using ultrasound guided technique. Following insertion, dressing applied and Biopatch. Patient tolerated the procedure well with no immediate complications.

## 2022-11-14 NOTE — H&P (Signed)
Electrophysiology Office Follow up Visit Note:     Date:  11/14/2022    ID:  Kelly [name], DOB 04-09-1956, MRN [id number]   PCP:  Kelly Stabile, MD           [facility] Cardiologist:  Kelly Dell, MD  [facility] Electrophysiologist:  Kelly Prude, MD      Interval History:     Kelly [name] is a 67 y.o. male who presents for a follow up visit.    I last saw the patient September 11, 2020.  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  He has mild to moderate aortic stenosis.  He has a history of coronary artery disease and had a prior bypass surgery.  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   He was seen recently by Kelly [name] in the clinic October 31, 2022.  At that appointment he reported intermittent lightheadedness and dizziness.  EKG at that appointment showed intermittent ventricular capture with a ventricular escape in the 30s.  Pacemaker interrogation confirmed an elevated capture threshold.     He was previously working at [employer] as a Psychologist, occupational.  This job was very stressful and he is working up to 96 hours/week.  He was taking nitroglycerin almost daily given all the stress and related chest pains.  Since changing jobs he has had almost a complete elimination of chest pain episodes.  No presyncope or syncope.  He intermittently will feel dizzy when he stands up but it quickly resolves.  He has not needed nitroglycerin.   Presents for RV extraction and CRT-D upgrade.  Objective  Past medical, surgical, social and family history were reviewed.   ROS:   Please see the history of present illness.    All other systems reviewed and are negative.   EKGs/Labs/Other Studies Reviewed:     The following studies were reviewed today:   November 07, 2022 in clinic device interrogation personally reviewed Capture threshold today in clinic unipolar 3.5 at 1 ms. Atrial lead parameters stable Dependent on ventricular pacing at 30   October 31, 2022 EKG shows complete heart block, lack of ventricular lead capture     September 10, 2022 remote interrogation Ventricular lead threshold 1.75-0.4         October 31, 2022 chest x-ray reviewed-stable lead positions     Physical Exam:     VS:  BP 130/77   Pulse 67   Ht 6\' 4"  (1.93 m)   Wt 226 lb 12.8 oz (102.9 kg)   SpO2 97%   BMI 27.61 kg/m         Wt Readings from Last 3 Encounters:  11/07/22 226 lb 12.8 oz (102.9 kg)  10/31/22 230 lb 6.4 oz (104.5 kg)  08/07/22 225 lb (102.1 kg)      GEN:  Well nourished, well developed in no acute distress CARDIAC: RRR, 2 out of 6 crescendo decrescendo systolic murmur at the upper sternal borders.  No rubs, gallops.  Pacemaker pocket well-healed RESPIRATORY:  Clear to auscultation without rales, wheezing or rhonchi          Assessment ASSESSMENT:     1. Cardiac pacemaker in situ   2. Failure of pacemaker lead, initial encounter   3. Paroxysmal atrial fibrillation (HCC)     PLAN:     In order of problems listed above:   #Permanent pacemaker in situ #Pacemaker lead failure The patient has a permanent pacemaker  in situ with underlying complete heart block.  The RV lead now has a significantly elevated RV capture threshold.  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   The lead was implanted in 2021 and is a [employer] LPA 1200 M lead.  Given the dwell time, this will need to be removed and replaced in the operating room with cardiothoracic surgical backup.  I discussed the lead extraction procedure in detail including the risks and he wishes to proceed.  He will hold his Coumadin for 5 days prior to the procedure and bridge with Lovenox given his history of recurrent DVTs.  I will ask our pharmacy team to assist with this.  I will likely restart Coumadin postop and let his INR drift up.   Risks, benefits, alternatives to PPM extraction and CRT-D implantation were discussed in detail with the patient today. The patient  understands that the risks include but are not limited to bleeding, infection, pneumothorax, perforation, tamponade, vascular damage, renal failure, MI, stroke, death, lead dislodgement, severe bleeding requiring emergent open heart surgery and wishes to proceed.  We will therefore schedule device implantation at the next available time.    Echo showed reduced LV function. Plan for CRT upgrade at time of RV pace/sense extraction.   #Paroxysmal atrial fibrillation On Coumadin for stroke prophylaxis.  Hold Coumadin as above.      Presents for RV pace/sense lead extraction upgrade to CRT-D. Procedure reviewed.         Signed, Kelly Dunn, MD, [facility], [facility] 11/14/2022 Electrophysiology [facility]

## 2022-11-15 ENCOUNTER — Encounter: Payer: Self-pay | Admitting: Physician Assistant

## 2022-11-15 ENCOUNTER — Ambulatory Visit (HOSPITAL_COMMUNITY): Payer: Medicare PPO

## 2022-11-15 DIAGNOSIS — Z95810 Presence of automatic (implantable) cardiac defibrillator: Secondary | ICD-10-CM

## 2022-11-15 DIAGNOSIS — I5022 Chronic systolic (congestive) heart failure: Secondary | ICD-10-CM | POA: Diagnosis not present

## 2022-11-15 DIAGNOSIS — T82110A Breakdown (mechanical) of cardiac electrode, initial encounter: Secondary | ICD-10-CM | POA: Diagnosis not present

## 2022-11-15 DIAGNOSIS — I442 Atrioventricular block, complete: Secondary | ICD-10-CM | POA: Diagnosis not present

## 2022-11-15 DIAGNOSIS — I480 Paroxysmal atrial fibrillation: Secondary | ICD-10-CM | POA: Diagnosis not present

## 2022-11-15 LAB — HEMOGLOBIN A1C
Hgb A1c MFr Bld: 7.3 % — ABNORMAL HIGH (ref 4.8–5.6)
Mean Plasma Glucose: 163 mg/dL

## 2022-11-15 LAB — GLUCOSE, CAPILLARY: Glucose-Capillary: 230 mg/dL — ABNORMAL HIGH (ref 70–99)

## 2022-11-15 NOTE — Discharge Summary (Signed)
Discharge Summary    Patient [name] MRN: [id number]; DOB: [birth year]  Admit date: 11/14/2022 Discharge date: 11/15/2022  PCP:  Benita Stabile, MD   [facility] Providers Cardiologist:  Nona Dell, MD  Electrophysiologist:  Lanier Prude, MD       Discharge Diagnoses    Principal Problem:   ICD (implantable cardioverter-defibrillator) in place    Diagnostic Studies/Procedures    LEAD EXTRACTION: 11/14/2022  CONCLUSIONS:   1.  Symptomatic bradycardia due to complete heart block post permanent pacemaker implant complicated by RV lead malfunction  2. Successful removal of an RV pace/sense lead and upgrade of his system to a CRT-D   3.  No early apparent complications.   4.  Restart Coumadin tomorrow _____________   History of Present Illness     Kelly [name] is a 67 y.o. 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 He has mild to moderate aortic stenosis. He has a history of coronary artery disease and had a prior bypass surgery. 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   He was noted to have an elevated capture threshold at office visit 6/14.  He was seen by Dr. 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  Hospital Course     Consultants: Dr. Laneta Simmers was available on standby during the procedure.  He tolerated the procedure well.  His insulin was managed by decreasing the daily dose and adding sliding scale.  His Coumadin is to be restarted on 6/30.  No dose change.  On 6/29, he was seen by Dr. Lalla Brothers and all data were reviewed.  His ECG demonstrated a narrow QRS with evidence of left-sided conduction system capture.  Device interrogation showed stable device function.  Chest x-ray showed leads in good position with no  pneumothorax.  Will need close surveillance of his aortic valve moving forward.  Will also consider left heart catheterization to reassess coronaries at follow-up.       Did the patient have an acute coronary syndrome (MI, NSTEMI, STEMI, etc) this admission?:  No                               Did the patient have a percutaneous coronary intervention (stent / angioplasty)?:  No.         _____________  Discharge Vitals Blood pressure 129/60, pulse 60, temperature 98.4 F (36.9 C), temperature source Oral, resp. rate 19, height 6\' 4"  (1.93 m), weight 96.6 kg, SpO2 95 %.  Filed Weights   11/14/22 0552  Weight: 96.6 kg    Labs & Radiologic Studies    CBC No results for input(s): "WBC", "NEUTROABS", "HGB", "HCT", "MCV", "PLT" in the last 72 hours. Basic Metabolic Panel No results for input(s): "NA", "K", "CL", "CO2", "GLUCOSE", "BUN", "CREATININE", "CALCIUM", "MG", "PHOS" in the last 72 hours. Liver Function Tests No results for input(s): "AST", "ALT", "ALKPHOS", "BILITOT", "PROT", "ALBUMIN" in the last 72 hours. No results for input(s): "LIPASE", "AMYLASE" in the last 72 hours. High Sensitivity Troponin:   No results for input(s): "TROPONINIHS" in the last 720 hours.  BNP Invalid input(s): "POCBNP" D-Dimer No results for input(s): "DDIMER" in the last 72 hours. Hemoglobin A1C Recent Labs    11/14/22 1422  HGBA1C 7.3*   Fasting Lipid Panel No results for input(s): "CHOL", "HDL", "LDLCALC", "TRIG", "CHOLHDL", "LDLDIRECT" in the last 72 hours. Thyroid Function Tests No results for input(s): "TSH", "T4TOTAL", "T3FREE", "THYROIDAB" in the last 72 hours.  Invalid input(s): "FREET3" _____________  EP PPM/ICD IMPLANT  Result Date: 11/14/2022  CONCLUSIONS:  1.  Symptomatic bradycardia due to complete heart block post permanent pacemaker implant complicated by RV lead malfunction  2. Successful removal of an RV pace/sense lead and upgrade of his system to a CRT-D  3.  No early  apparent complications.  4.  Restart Coumadin tomorrow   CT LEAD EXTRACTION W/CONTRAST  Result Date: 11/11/2022 CLINICAL DATA:  Cardiovascular Implantable Electronic Device (CIED) lead extraction Planning EXAM: Gated Cardiac CT cardiac TECHNIQUE: A 120 kV prospective scan was performed 50 seconds after contrast injection to optimize venous structures. The field of view included the subclavian veins to the heart. Gantry rotation speed was 250 msecs and collimation was 0.6 mm. The 3D data set was reconstructed in 5% intervals of the 55-75 % of the R-R cycle. Diastolic phases were analyzed on a dedicated work station using MPR, MIP and VRT modes. The patient received 80 cc of contrast. FINDINGS: Image quality: Average. Noise artifact is: Moderate - significant beam hardening artifact despite iMAR modification. CIED: Course: There is a left side CIED. There are 2 CIED leads present that course through the left subclavian vein, left brachiocephalic vein, and superior vena cava. There is a single lead that terminates in the right atrium, and a single lead that terminates in the right ventricle. The relationship between CIED leads is further detailed below: Superior Vena Cava: There are 2 CIED leads that course through the SVC. The CIED leads are located centrally within the lumen. The CIED leads touch the wall of the SVC for >= 1 cm. Right Atrium: There is a CIED lead that is embedded within the RA appendage. The CIED does not terminate beyond the RA wall. Right Ventricle: There is a CIED lead the is embedded within the RV apex free wall. The CIED lead terminates beyond the RV wall, <5 mm beyond the RV wall. Lead Fracture: No CIED lead fractures were noted. Thrombus: No CIED lead thrombus was detected. Central Veins: The subclavian vein, brachiocephalic vein, and superior vena cava are patent without stenoses, contrast timing limits this assessment. Right Atrium: Right atrial size is mildly dilated Right Ventricle:  The right ventricular cavity is within normal limits. Left Atrium: Left atrial size is normal. Left Ventricle: The ventricular cavity size is within normal limits. Pulmonary arteries: Normal in size. Pulmonary veins: Normal pulmonary venous drainage. Pericardium: Normal thickness with no significant effusion or calcium present. Aorta: Normal caliber without significant disease. Status post CABG.  Grafts not evaluated due to contrast timing. Extra-cardiac findings: See attached radiology report for non-cardiac structures. IMPRESSION: 1. The CIED leads are located centrally within the SVC. The CIED leads touch the wall of the SVC for >= 1 cm (RV lead). 2. A CIED lead is embedded within the RA wall without high-risk features for extraction. 3. A CIED lead is embedded within the RV apex with moderate-risk features for extraction, the CIED lead terminates beyond the RV wall, <5 mm beyond the RV wall. See screen captures in PACS. 4. No lead fractures or CIED lead thrombus detected. 5. Overall, these findings are moderate risk for CIED lead extraction. Electronically Signed   By: Weston Brass M.D.   On: 11/11/2022 16:10   ECHOCARDIOGRAM COMPLETE  Result Date: 11/10/2022    ECHOCARDIOGRAM REPORT   Patient Name:   Kelly [name] Vivian Date of Exam: 11/10/2022 Medical Rec #:  [id number]        Height:       76.0 in Accession #:    [id number]       Weight:       226.8 lb Date of Birth:  09-02-1955        BSA:          2.337 m Patient Age:    67 years         BP:           122/70 mmHg Patient Gender: M                HR:           61 bpm. Exam Location:  [address] Procedure: 2D Echo, Cardiac Doppler and Color Doppler Indications:    R01.1 Murmur  History:        Patient has prior history of Echocardiogram examinations, most                 recent 10/28/2021. CAD and Previous Myocardial Infarction, Prior                 CABG and Pacemaker, Signs/Symptoms:Murmur; Risk                 Factors:Hypertension, Dyslipidemia and  Diabetes.  Sonographer:    Daphine Deutscher RDCS Referring Phys: [id number] Rossie Muskrat LAMBERT IMPRESSIONS  1. Left ventricular ejection fraction, by estimation, is 30 to 35%. The left ventricle has moderately decreased function. The left ventricle demonstrates global hypokinesis. The left ventricular internal cavity size was mildly dilated. There is mild left ventricular hypertrophy. Left ventricular diastolic parameters are indeterminate.  2. Right ventricular systolic function is mildly reduced. The right ventricular size is mildly enlarged. There is normal pulmonary artery systolic pressure. The estimated right ventricular systolic pressure is 21.3 mmHg.  3. The mitral valve is normal in structure. Mild mitral valve regurgitation. No evidence of mitral stenosis.  4. The aortic valve is calcified. There is severe calcifcation of the aortic valve. Aortic valve regurgitation is not visualized. Moderate to severe aortic valve stenosis. Vmax 2.8 m/s, MG 18 mmHg, AVA 1.0 cm^2, DI 0.25. Low flow low gradient moderate to severe AS  5. The inferior vena cava is normal in size with greater than 50% respiratory variability, suggesting right atrial pressure of 3 mmHg. FINDINGS  Left Ventricle: Left ventricular ejection fraction, by estimation, is 30 to 35%. The left ventricle has moderately decreased function. The left ventricle demonstrates global hypokinesis. The left ventricular internal cavity size was mildly dilated. There is mild left ventricular hypertrophy. Left ventricular diastolic parameters are indeterminate. Right Ventricle: The right ventricular size is mildly enlarged. No increase in right ventricular wall thickness. Right ventricular systolic function is mildly reduced. There is normal pulmonary artery systolic pressure. The tricuspid regurgitant velocity  is 2.14 m/s, and with an assumed right atrial pressure of 3 mmHg, the estimated right ventricular systolic pressure is 21.3 mmHg. Left Atrium: Left  atrial size was normal in size. Right Atrium: Right atrial size was normal in size. Pericardium: There is no evidence of pericardial effusion. Mitral Valve: The mitral valve is normal in structure. Mild mitral valve regurgitation. No evidence of mitral valve stenosis. Tricuspid Valve: The tricuspid valve is normal in structure. Tricuspid valve regurgitation is trivial. Aortic Valve: The aortic valve is calcified.  There is severe calcifcation of the aortic valve. Aortic valve regurgitation is not visualized. Moderate to severe aortic stenosis is present. Aortic valve mean gradient measures 14.6 mmHg. Aortic valve peak gradient measures 25.2 mmHg. Aortic valve area, by VTI measures 0.96 cm. Pulmonic Valve: The pulmonic valve was not well visualized. Pulmonic valve regurgitation is not visualized. Aorta: The aortic root is normal in size and structure. Venous: The inferior vena cava is normal in size with greater than 50% respiratory variability, suggesting right atrial pressure of 3 mmHg. IAS/Shunts: The interatrial septum was not well visualized.  LEFT VENTRICLE PLAX 2D LVIDd:         5.90 cm   Diastology LVIDs:         5.10 cm   LV e' medial:    5.12 cm/s LV PW:         1.10 cm   LV E/e' medial:  12.2 LV IVS:        0.90 cm   LV e' lateral:   10.20 cm/s LVOT diam:     2.30 cm   LV E/e' lateral: 6.1 LV SV:         60 LV SV Index:   26 LVOT Area:     4.15 cm  RIGHT VENTRICLE RV Basal diam:  4.80 cm RV S prime:     8.54 cm/s TAPSE (M-mode): 1.8 cm LEFT ATRIUM             Index        RIGHT ATRIUM           Index LA diam:        5.50 cm 2.35 cm/m   RA Area:     19.40 cm LA Vol (A2C):   61.9 ml 26.49 ml/m  RA Volume:   59.30 ml  25.38 ml/m LA Vol (A4C):   85.2 ml 36.46 ml/m LA Biplane Vol: 73.2 ml 31.32 ml/m  AORTIC VALVE AV Area (Vmax):    1.00 cm AV Area (Vmean):   0.92 cm AV Area (VTI):     0.96 cm AV Vmax:           251.00 cm/s AV Vmean:          179.400 cm/s AV VTI:            0.629 m AV Peak Grad:       25.2 mmHg AV Mean Grad:      14.6 mmHg LVOT Vmax:         60.40 cm/s LVOT Vmean:        39.750 cm/s LVOT VTI:          0.146 m LVOT/AV VTI ratio: 0.23  AORTA Ao Root diam: 3.50 cm Ao Asc diam:  3.80 cm MITRAL VALVE               TRICUSPID VALVE MV Area (PHT): 2.64 cm    TR Peak grad:   18.3 mmHg MV Decel Time: 287 msec    TR Vmax:        214.00 cm/s MV E velocity: 62.55 cm/s MV A velocity: 49.90 cm/s  SHUNTS MV E/A ratio:  1.25        Systemic VTI:  0.15 m                            Systemic Diam: 2.30 cm Epifanio Lesches MD Electronically signed by Epifanio Lesches MD Signature Date/Time: 11/10/2022/4:39:32 PM  Final    CUP PACEART INCLINIC DEVICE CHECK  Result Date: 11/07/2022 Pacemaker check in clinic. Out of normal range thresholds, sensing, impedances but are consistent with previous measurements. Device programmed to maximize longevity. No mode switch or high ventricular rates noted. Device programmed at appropriate safety  margins. Histogram distribution appropriate for patient activity level. Device programmed to optimize intrinsic conduction. Estimated longevity 1.5 to 1.8 years. Patient enrolled in remote follow-up. Patient education completed. Device tentatively planned for replacement 6/28 with Lalla Brothers.Raj Janus, RN  CUP PACEART INCLINIC DEVICE CHECK  Result Date: 10/31/2022 Pacemaker check in clinic. Sensing consistent with previous measurements. Impedance for RV gradually trended down from > 400 to now <275. Pt noted to have intermittent LOC at current settings with escape in the 30s. Pt has had gradual increase in RV threshold, today 4.5V @ 1.0 ms Bipolar, 3.5V at 1.0 ms Unipolar. Programmed to 5V @ 1.0 ms and will get CXR and close MD follow up to discuss revision. Estimated longevity 1 yr 6 mo after changes today. Patient enrolled in remote follow-up. Patient education completed.  DG Chest 2 View  Result Date: 10/31/2022 CLINICAL DATA:  Pacemaker lead check.  Dizzy spells.  EXAM: CHEST - 2 VIEW COMPARISON:  Chest radiograph 03/28/2020. FINDINGS: The left chest wall cardiac device is stable in position. The leads are intact, terminating in the expected locations of the right atrium and right ventricle. Median sternotomy wires are unchanged. The cardiomediastinal silhouette is stable and within normal limits Reticular opacities in the lateral right base are unchanged, favored to reflect scar. There is no other focal airspace opacity. There is no pulmonary edema. There is no pleural effusion or pneumothorax There is no acute osseous abnormality. Metallic density in the left axillary region is unchanged. IMPRESSION: 1. Stable position of the left chest wall cardiac device and associated leads. The leads are intact. 2. No focal consolidation or pleural effusion. Electronically Signed   By: Lesia Hausen M.D.   On: 10/31/2022 10:14   Disposition   Pt is being discharged home today in good condition.  Follow-up Plans & Appointments     Discharge Instructions     Call MD for:  redness, tenderness, or signs of infection (pain, swelling, redness, odor or green/yellow discharge around incision site)   Complete by: As directed    Call MD for:  temperature >100.4   Complete by: As directed    Diet - low sodium heart healthy   Complete by: As directed    Increase activity slowly   Complete by: As directed         Discharge Medications   Allergies as of 11/15/2022   No Known Allergies      Medication List     TAKE these medications    acetaminophen 500 MG tablet Commonly known as: TYLENOL Take 500-1,000 mg by mouth every 6 (six) hours as needed (for pain.).   Alka-Seltzer Heartburn 1940-1000 MG Tbef Generic drug: Sodium Bicarbonate-Citric Acid Take 1-2 tablets by mouth 2 (two) times daily as needed (heartburn/indigestion).   amLODipine 2.5 MG tablet Commonly known as: NORVASC TAKE 1 TABLET(2.5 MG) BY MOUTH DAILY   aspirin EC 81 MG tablet Take 1 tablet  (81 mg total) by mouth daily.   B-complex with vitamin C tablet Take 1 tablet by mouth daily.   Basaglar KwikPen 100 UNIT/ML INJECT 80 UNITS INTO SKIN DAILY AT 10 PM What changed: See the new instructions.   cetirizine 10 MG tablet Commonly known as: ZYRTEC  Take 10 mg by mouth daily as needed for allergies.   ferrous sulfate 325 (65 FE) MG tablet Take 325 mg by mouth 2 (two) times daily.   FLINTSTONES COMPLETE PO Take 1 tablet by mouth daily.   FLUoxetine 20 MG capsule Commonly known as: PROZAC Take 20 mg by mouth daily.   gabapentin 600 MG tablet Commonly known as: NEURONTIN Take 600 mg by mouth at bedtime.   glucose blood test strip Commonly known as: OneTouch Verio USE AS DIRECTED FOUR- FIVE TIMES DAILY   OneTouch Verio test strip Generic drug: glucose blood USE TO TEST FOUR TIMES DAILY AS DIRECTED   isosorbide mononitrate 30 MG 24 hr tablet Commonly known as: IMDUR Take 2 tablets (60 mg total) by mouth daily.   Magnesium 250 MG Tabs Take 250 mg by mouth See admin instructions. Take 250 mg in the morning, may take another 250 mg dose up to 3 more times a day as needed for muscle cramps   multivitamin with minerals Tabs tablet Take 1 tablet by mouth in the morning and at bedtime.   Muscle Rub 10-15 % Crea Apply 1 Application topically as needed for muscle pain.   nitroGLYCERIN 0.4 MG SL tablet Commonly known as: NITROSTAT Place 1 tablet (0.4 mg total) under the tongue every 5 (five) minutes x 3 doses as needed for chest pain.   Oxycodone HCl 10 MG Tabs Take 10 mg by mouth 2 (two) times daily as needed (pain).   pantoprazole 40 MG tablet Commonly known as: PROTONIX TAKE 1 TABLET BY MOUTH DAILY BEFORE BREAKFAST   simvastatin 10 MG tablet Commonly known as: ZOCOR TAKE 1 TABLET BY MOUTH EVERY NIGHT AT BEDTIME   Vitamin C 500 MG Caps Take 500 mg by mouth daily.   Vitamin D3 50 MCG (2000 UT) Tabs Take 2,000 Units by mouth daily.   warfarin 5 MG  tablet Commonly known as: COUMADIN Take as directed. If you are unsure how to take this medication, talk to your nurse or doctor. Original instructions: TAKE 1 AND 1/2 TABLETS BY MOUTH DAILY EXCEPT 1 TABLET ON TUESDAYS, THURSDAY AND SATURDAYS OR AS DIRECTED Notes to patient: HOLD coumadin/warfarin today. Restart tomorrow, 06/30           Outstanding Labs/Studies   None  Duration of Discharge Encounter   Greater than 30 minutes including physician time.  Signed, Theodore Demark, PA-C 11/15/2022, 9:30 AM

## 2022-11-15 NOTE — Op Note (Signed)
TCTS  I was on surgical standby and assisted Dr. Lalla Brothers with ventricular lead extraction and system upgrade for 2 hrs 20 minutes.

## 2022-11-15 NOTE — Progress Notes (Signed)
   Rounding Note    Patient Name: Kelly [name] Date of Encounter: 11/15/2022  [facility] Cardiologist: Nona Dell, MD   Subjective   Doing well this morning.  In chair.  Minimal pain.  Vital Signs    Vitals:   11/14/22 2353 11/15/22 0334 11/15/22 0450 11/15/22 0757  BP:  (!) 124/55 125/62 129/60  Pulse:  61 60 60  Resp:  15 17 19   Temp: 98.2 F (36.8 C) 98 F (36.7 C)  98.4 F (36.9 C)  TempSrc: Oral Oral  Oral  SpO2:  93% 93% 95%  Weight:      Height:        Intake/Output Summary (Last 24 hours) at 11/15/2022 0837 Last data filed at 11/15/2022 0300 Gross per 24 hour  Intake 2170 ml  Output 650 ml  Net 1520 ml      11/14/2022    5:52 AM 11/07/2022    7:45 AM 10/31/2022    8:38 AM  Last 3 Weights  Weight (lbs) 213 lb 226 lb 12.8 oz 230 lb 6.4 oz  Weight (kg) 96.616 kg 102.876 kg 104.509 kg      Telemetry    Personally Reviewed  ECG    Personally Reviewed  Physical Exam    GEN: No acute distress.   Cardiac: RRR, no murmurs, rubs, or gallops.  Left shoulder minimal pain.  No hematoma. 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  His paced QRS is narrow with evidence of left-sided conduction system capture.  Chest x-ray shows leads in stable position.  Device interrogation this morning shows stable device function.  He will discharge today.  He will restart his Coumadin tomorrow.  He should have his INR checked early next week in [place].  Will need close surveillance of his aortic valve moving forward.  Will also consider left heart catheterization to reassess coronaries at follow-up.  Okay to discharge today.    [name], MD, [facility], [facility] Cardiac Electrophysiology

## 2022-11-17 ENCOUNTER — Encounter (HOSPITAL_COMMUNITY): Payer: Self-pay | Admitting: Cardiology

## 2022-11-18 ENCOUNTER — Encounter (HOSPITAL_COMMUNITY): Payer: Self-pay | Admitting: Cardiology

## 2022-11-18 LAB — BPAM RBC
Blood Product Expiration Date: 202407182359
Blood Product Expiration Date: 202407182359
Blood Product Expiration Date: 202407182359
Unit Type and Rh: 6200
Unit Type and Rh: 6200
Unit Type and Rh: 6200
Unit Type and Rh: 6200

## 2022-11-18 LAB — TYPE AND SCREEN
Antibody Screen: NEGATIVE
Unit division: 0

## 2022-11-18 NOTE — Anesthesia Postprocedure Evaluation (Signed)
Anesthesia Post Note  Patient: Kelly [name]  Procedure(s) Performed: LEAD EXTRACTION     Patient location during evaluation: PACU Anesthesia Type: MAC Level of consciousness: awake and alert Pain management: pain level controlled Vital Signs Assessment: post-procedure vital signs reviewed and stable Respiratory status: spontaneous breathing, nonlabored ventilation, respiratory function stable and patient connected to nasal cannula oxygen Cardiovascular status: stable and blood pressure returned to baseline Postop Assessment: no apparent nausea or vomiting Anesthetic complications: no   No notable events documented.  Last Vitals:  Vitals:   11/15/22 0450 11/15/22 0757  BP: 125/62 129/60  Pulse: 60 60  Resp: 17 19  Temp:  36.9 C  SpO2: 93% 95%    Last Pain:  Vitals:   11/15/22 0757  TempSrc: Oral  PainSc: 0-No pain                 Sagal Gayton

## 2022-11-18 NOTE — Anesthesia Postprocedure Evaluation (Signed)
Anesthesia Post Note  Patient: Kelly [name]  Procedure(s) Performed: LEAD EXTRACTION     Patient location during evaluation: Cath Lab Anesthesia Type: General Level of consciousness: awake and alert Pain management: pain level controlled Vital Signs Assessment: post-procedure vital signs reviewed and stable Respiratory status: spontaneous breathing, nonlabored ventilation, respiratory function stable and patient connected to nasal cannula oxygen Cardiovascular status: blood pressure returned to baseline and stable Postop Assessment: no apparent nausea or vomiting Anesthetic complications: no   No notable events documented.  Last Vitals:  Vitals:   11/15/22 0450 11/15/22 0757  BP: 125/62 129/60  Pulse: 60 60  Resp: 17 19  Temp:  36.9 C  SpO2: 93% 95%    Last Pain:  Vitals:   11/15/22 0757  TempSrc: Oral  PainSc: 0-No pain                 Permelia Bamba

## 2022-11-18 NOTE — Addendum Note (Signed)
Addendum  created 11/18/22 1316 by Val Eagle, MD   Attestation recorded in Intraprocedure, Clinical Note Signed, Intraprocedure Attestations deleted, Intraprocedure Attestations filed, SmartForm saved

## 2022-11-19 ENCOUNTER — Other Ambulatory Visit: Payer: Self-pay | Admitting: Cardiology

## 2022-11-21 LAB — ECHO INTRAOPERATIVE TEE
AR max vel: 1.92 cm2
AV Area VTI: 1.72 cm2
AV Area mean vel: 1.87 cm2
AV Mean grad: 12 mmHg
AV Peak grad: 19.7 mmHg
Ao pk vel: 2.22 m/s

## 2022-11-24 ENCOUNTER — Ambulatory Visit: Payer: Medicare PPO | Attending: Cardiology | Admitting: *Deleted

## 2022-11-24 DIAGNOSIS — Z5181 Encounter for therapeutic drug level monitoring: Secondary | ICD-10-CM | POA: Diagnosis not present

## 2022-11-24 DIAGNOSIS — D6869 Other thrombophilia: Secondary | ICD-10-CM

## 2022-11-24 DIAGNOSIS — I480 Paroxysmal atrial fibrillation: Secondary | ICD-10-CM | POA: Diagnosis not present

## 2022-11-24 LAB — POCT INR: INR: 2.3 (ref 2.0–3.0)

## 2022-11-24 NOTE — Patient Instructions (Signed)
Continue warfarin 1 1/2 tablets daily except 1 tablet on Tuesdays, Thursdays and Saturdays -Recheck in office in 4 wk

## 2022-11-25 ENCOUNTER — Telehealth: Payer: Self-pay | Admitting: Cardiology

## 2022-11-25 NOTE — Telephone Encounter (Signed)
Patient's wife reports that the patient returned to work on Monday 7/8. He did not go to work today because his should and arm are hurting him. She was unable to provide me any further details about his pain. I reviewed arm restrictions with the patient's wife. She states that his incision site looks normal and steri strips are still in place. He has a wound check on 7/11.

## 2022-11-25 NOTE — Telephone Encounter (Signed)
Patient's wife is requesting assistance with having return to work date extended. Please advise.

## 2022-11-26 NOTE — Patient Instructions (Signed)
   After Your ICD (Implantable Cardiac Defibrillator)    Monitor your defibrillator site for redness, swelling, and drainage. Call the device clinic at [phone] if you experience these symptoms or fever/chills.  Your incision was closed with Steri-strips or staples:  You may shower 7 days after your procedure and wash your incision with soap and water. Avoid lotions, ointments, or perfumes over your incision until it is well-healed.  You may use a hot tub or a pool after your wound check appointment if the incision is completely closed.  Do not lift, push or pull greater than 10 pounds with the affected arm until 6 weeks after your procedure. AFTER [facility].  There are no other restrictions in arm movement after your wound check appointment.  Your ICD is designed to protect you from life threatening heart rhythms. Because of this, you may receive a shock.   1 shock with no symptoms:  Call the office during business hours. 1 shock with symptoms (chest pain, chest pressure, dizziness, lightheadedness, shortness of breath, overall feeling unwell):  Call 911. If you experience 2 or more shocks in 24 hours:  Call 911. If you receive a shock, you should not drive.  [place] DMV - no driving for 6 months if you receive appropriate therapy from your ICD.   ICD Alerts:  Some alerts are vibratory and others beep. These are NOT emergencies. Please call our office to let us know. If this occurs at night or on weekends, it can wait until the next business day. Send a remote transmission.  If your device is capable of reading fluid status (for heart failure), you will be offered monthly monitoring to review this with you.   Remote monitoring is used to monitor your ICD from home. This monitoring is scheduled every 91 days by our office. It allows [place] to keep an eye on the functioning of your device to ensure it is working properly. You will routinely see your Electrophysiologist annually (more often if  necessary).

## 2022-11-27 ENCOUNTER — Ambulatory Visit: Payer: Medicare PPO | Attending: Cardiovascular Disease

## 2022-11-27 DIAGNOSIS — I442 Atrioventricular block, complete: Secondary | ICD-10-CM

## 2022-11-28 LAB — CUP PACEART INCLINIC DEVICE CHECK
Battery Remaining Longevity: 43 mo
Brady Statistic RA Percent Paced: 56 %
Brady Statistic RV Percent Paced: 99 %
Date Time Interrogation Session: 20240711154900
HighPow Impedance: 49.5 Ohm
Implantable Lead Connection Status: 753985
Implantable Lead Connection Status: 753985
Implantable Lead Connection Status: 753985
Implantable Lead Implant Date: 20211025
Implantable Lead Implant Date: 20240628
Implantable Lead Implant Date: 20240628
Implantable Lead Location: 753859
Implantable Lead Location: 753860
Implantable Lead Location: 753860
Implantable Pulse Generator Implant Date: 20240628
Lead Channel Impedance Value: 400 Ohm
Lead Channel Impedance Value: 512.5 Ohm
Lead Channel Impedance Value: 525 Ohm
Lead Channel Pacing Threshold Amplitude: 0.75 V
Lead Channel Pacing Threshold Amplitude: 0.75 V
Lead Channel Pacing Threshold Amplitude: 1 V
Lead Channel Pacing Threshold Amplitude: 1 V
Lead Channel Pacing Threshold Amplitude: 2 V
Lead Channel Pacing Threshold Amplitude: 3.25 V
Lead Channel Pacing Threshold Pulse Width: 0.5 ms
Lead Channel Pacing Threshold Pulse Width: 0.5 ms
Lead Channel Pacing Threshold Pulse Width: 0.5 ms
Lead Channel Pacing Threshold Pulse Width: 0.5 ms
Lead Channel Pacing Threshold Pulse Width: 1 ms
Lead Channel Pacing Threshold Pulse Width: 1 ms
Lead Channel Sensing Intrinsic Amplitude: 12 mV
Lead Channel Sensing Intrinsic Amplitude: 2.2 mV
Lead Channel Setting Pacing Amplitude: 3 V
Lead Channel Setting Pacing Amplitude: 3.5 V
Lead Channel Setting Pacing Amplitude: 3.5 V
Lead Channel Setting Pacing Pulse Width: 0.5 ms
Lead Channel Setting Pacing Pulse Width: 0.5 ms
Lead Channel Setting Sensing Sensitivity: 0.5 mV
Pulse Gen Serial Number: 5563291
Zone Setting Status: 755011

## 2022-11-28 NOTE — Progress Notes (Signed)
Wound check appointment. Steri-strips removed. Wound without redness or edema. Incision edges approximated, wound well healed. Normal device function. CRT DEVICE, BIVP effectively: 99%.  Thresholds, sensing, and impedances consistent with implant measurements. Note:  RA threshold measuring higher output. (See below)  Device programmed at 3.5V for extra safety margin until 3 month visit. Histogram distribution appropriate for patient and level of activity. AT/AF burden <1%. No ventricular arrhythmias noted. Patient educated about wound care, arm mobility, lifting restrictions, shock plan. ROV in 3 months with implanting physician. Work note given to patient to return on 12/08/22 with arm mobility/lifting restrictions.  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  Educated patient on importance for lead integrity for him to follow restrictions and clarified in work note.  Patient verbalizes understanding.   (RA threshold evaluation)- [employer] reps [place] and Joni Reining assisting with evaluation: RA threshold testing confirmed: 2.0V @ 1.36ms (chronic lead) with loss at 1.75v.  Programming change RA - turned off cap confirm and programmed fixed 3.0@ 1.2ms. will continue to monitor watch for improved battery at 91 day check with acute safety margin adjustments on RV leads.

## 2022-12-02 ENCOUNTER — Encounter: Payer: Self-pay | Admitting: *Deleted

## 2022-12-05 ENCOUNTER — Ambulatory Visit (HOSPITAL_COMMUNITY)
Admission: RE | Admit: 2022-12-05 | Discharge: 2022-12-05 | Disposition: A | Discharge status: Home / Self Care | Payer: Medicare PPO | Source: Ambulatory Visit | Attending: Family Medicine | Admitting: Family Medicine

## 2022-12-05 ENCOUNTER — Other Ambulatory Visit (HOSPITAL_COMMUNITY): Payer: Self-pay | Admitting: Family Medicine

## 2022-12-05 ENCOUNTER — Telehealth: Payer: Self-pay

## 2022-12-05 DIAGNOSIS — M25512 Pain in left shoulder: Secondary | ICD-10-CM

## 2022-12-05 NOTE — Telephone Encounter (Signed)
Patient calls in with update on shoulder/arm pain.  He reports pain is a 10 when attempting to raise the arm above the elbow.  Pain is sharp and intense.  When holding arm in close to his body, it is relieved. He is unable to lift anything (paper, cell phone etc) without having significant pain. He has been taking Tylenol scheduled and even taking oxycodone on occasion but nothing is relieving pain.    When seeing patient last week, there was no sign of infection in pocket and device was functioning normally.  Reviewed with Dr. Lalla Brothers who is concerned for patient's shoulder and possible "frozen" shoulder following procedure and recommends patient be evaluated.   Discussed with patient's wife (okay per patient).  Options are to have shoulder evaluated at urgent care/primary provider or orthopedist if he has one he sees.  Wife verbalizes understanding of concerns, will discuss with patient and take next steps.  They will call back if any concerns.  We can consider extending work note if needed but should get shoulder evaluated.

## 2022-12-08 NOTE — Telephone Encounter (Signed)
Refer to phone note (12/05/2022).  Follow up check in on patient and confirmation to keep 12/22/2022 appt.

## 2022-12-09 ENCOUNTER — Ambulatory Visit: Payer: BC Managed Care – PPO

## 2022-12-09 NOTE — Telephone Encounter (Signed)
Manual transmission reviewed that was sent in this morning. Device function is normal. No episodes. EGM shows AP/BI VP approx 89 bpm.  BIVP effectively 99% of the time.    Nothing that correlates with positional change dizziness.  Informed patient's wife that he needs to continue checks on bp and follow up with Dr. Diona Browner.  Keep himself hydrated and be careful with positional changes.  BP yesterday was 110/50's.  They will be following up with Dr. Diona Browner.   Overall, patient is healing and doing better this week per wife's report.  They have our device clinic number if any further questions or concerns.

## 2022-12-17 NOTE — Progress Notes (Signed)
Referring Provider: Benita Stabile, MD Primary Care Physician:  Benita Stabile, MD Primary Gastroenterologist:  Dr. Marletta Lor  No chief complaint on file.   HPI:   Kelly [name] is a 67 y.o. male presenting today at the request of Benita Stabile, MD for colon cancer screening.  Patient also has history of GERD, peptic ulcer disease, compensated cirrhosis thought to be secondary to fatty liver, and has not been seen in our office since 2018.  Last colonoscopy in 2008 by Dr. Kinnie Scales with mild left colonic diverticulosis.  Last EGD in September 2016 with no evidence of varices.  He had persistent gastric ulcer, single gastric polyp.  Today:  Labs: 11/25/22- Platelets 170, Cr 0.99, sodium 143, albumin 4, total bilirubin 0.5, LFTs within normal limits. INR 2.3 on 7/8 in the setting of warfarin.  MELD 3.0: 14 though influenced by elevated INR secondary to Warfarin.  [place]: Overdue.  Last ultrasound in 2017 with no focal liver lesion. AFP: Normal in 2017. Hep A/B vaccination: Natural immunity to hepatitis A.  No immunity to hepatitis B on labs in 2017. EGD: 01/19/2015: Persistent ulcer with firm base, single gastric polyp in the gastric antrum ulcer base. BB: No Ascites/peripheral edema: Right leg stays swollen. Had lymph note removed and veins removed from right leg (for CABG) and has had swelling since. No abdominal swelling.  Diuretics: None.  Paracentesis: Never.  History of SBP: No Encephalopathy:  Can be forgetful. No confusion/recent mental status change.  No nausea, vomiting, GERD, dysphagia. Has intermittent black stools, but takes iron.  Will have black stools for 2-3 days, then stop. Thinks it related to what he eats as well. No pepto bismol. No brbpr. Bowels can fluctuate. Can skip a couple days between bowel movements, then have a few bowel movements in 1 day.   11/25/2022: Iron 84, iron saturation 41%, hemoglobin 11.8  Denies chest pain or SOB.    Past Medical History:  Diagnosis Date    Anemia    Anxiety    Aortic stenosis    Arthritis    Atrial fibrillation [facility])    Atrial fibrillation (HCC)    CHF (congestive heart failure) (HCC)    Chronic pain    Cirrhosis (HCC)    Coronary atherosclerosis of native coronary artery    BMS LAD and PTCA RCA 2000,BMS RCA/PLA 2001, CABG June 2018   Depression    Diverticulitis    Essential hypertension    Fatty liver    Gastric ulcer    GERD (gastroesophageal reflux disease)    Hyperlipidemia    Low-grade NHL (non-Hodgkin's lymphoma) 2007   Lupus anticoagulant positive    MI (myocardial infarction) (HCC) 2001   Pacemaker    Paresthesia of upper limb    Peripheral neuropathy    Peripheral vascular disease (HCC)    blood clots in legs   Thrombocytopenia (HCC)    Trifascicular block    Type 2 diabetes mellitus (HCC)    Umbilical hernia     Past Surgical History:  Procedure Laterality Date   Axillary abscess     left incision and drainage left   BACK SURGERY     multiple   BIOPSY  08/15/2014   Procedure: BIOPSY;  Surgeon: West Bali, MD;  Location: [facility]S;  Service: Endoscopy;;  Gastric   BIOPSY  01/19/2015   Procedure: BIOPSY (GASTRIC ULCER);  Surgeon: West Bali, MD;  Location: [facility]S;  Service: Endoscopy;;   CARDIAC CATHETERIZATION  cardiac stents     x5 stents   COLONOSCOPY  June 2008   Dr. Tinnie Gens Medoff: Mild left colonic diverticulosis   CORONARY ARTERY BYPASS GRAFT N/A 11/11/2016   Procedure: CORONARY ARTERY BYPASS GRAFTING (CABG) x 3, USING LEFT MAMMARY ARTERY AND RIGHT GREATER SAPHENOUS VEIN HARVESTED ENDOSCOPICALLY;  Surgeon: Kerin Perna, MD;  Location: [facility] OR;  Service: Open Heart Surgery;  Laterality: N/A;   CORONARY PRESSURE/FFR STUDY N/A 09/26/2016   Procedure: Intravascular Pressure Wire/FFR Study;  Surgeon: Yvonne Kendall, MD;  Location: [facility] INVASIVE CV LAB;  Service: Cardiovascular;  Laterality: N/A;   Dental extractions     ESOPHAGOGASTRODUODENOSCOPY  June 2008   Dr. Tinnie Gens Medoff:  Gastric ulcer, antral, biopsy with reactive changes associated glandular atrophy, no H pylori. No features of lymphoma. Likely NSAID related   ESOPHAGOGASTRODUODENOSCOPY  August 2008   Dr. Tinnie Gens Medoff: Complete healing of gastric ulcers.   ESOPHAGOGASTRODUODENOSCOPY (EGD) WITH PROPOFOL N/A 08/15/2014   SLF: 1. Abnormal pain due to ulcers & gastriitis    ESOPHAGOGASTRODUODENOSCOPY (EGD) WITH PROPOFOL N/A 01/19/2015   SLF: 1. Persistant ulcer with firm base 2. single gastric polyp found in the gastric antrum. Ulcer base    EUS N/A 03/21/2015   Thickened gastric wall in pre-pyloric antrum, no obvious intramural mass, fatty pancreas, no obvious pancreatic mass.    Inguinal lymph node biopsy     right inguinal lymph node biopsy   LEAD EXTRACTION N/A 11/14/2022   Procedure: LEAD EXTRACTION;  Surgeon: Lanier Prude, MD;  Location: [facility] INVASIVE CV LAB;  Service: Cardiovascular;  Laterality: N/A;   LEFT HEART CATH AND CORONARY ANGIOGRAPHY N/A 09/26/2016   Procedure: Left Heart Cath and Coronary Angiography;  Surgeon: Yvonne Kendall, MD;  Location: [facility] INVASIVE CV LAB;  Service: Cardiovascular;  Laterality: N/A;   LEFT HEART CATH AND CORS/GRAFTS ANGIOGRAPHY N/A 06/29/2020   Procedure: LEFT HEART CATH AND CORS/GRAFTS ANGIOGRAPHY;  Surgeon: Tonny Bollman, MD;  Location[facility] INVASIVE CV LAB;  Service: Cardiovascular;  Laterality: N/A;   NECK SURGERY     PACEMAKER IMPLANT N/A 03/12/2020   Procedure: PACEMAKER IMPLANT;  Surgeon: Lanier Prude, MD;  Location: [facility] INVASIVE CV LAB;  Service: Cardiovascular;  Laterality: N/A;   POLYPECTOMY  01/19/2015   Procedure: POLYPECTOMY (GASTRIC);  Surgeon: West Bali, MD;  Location: [facility];  Service: Endoscopy;;   ROUX-EN-Y GASTRIC BYPASS     RNY gastric bypass   SHOULDER ARTHROSCOPY Left    TEE WITHOUT CARDIOVERSION N/A 11/11/2016   Procedure: TRANSESOPHAGEAL ECHOCARDIOGRAM (TEE);  Surgeon: Donata Clay, Theron Arista, MD;  Location: [facility] OR;  Service: Open Heart Surgery;   Laterality: N/A;    Current Outpatient Medications  Medication Sig Dispense Refill   acetaminophen (TYLENOL) 500 MG tablet Take 500-1,000 mg by mouth every 6 (six) hours as needed (for pain.).     amLODipine (NORVASC) 2.5 MG tablet TAKE 1 TABLET(2.5 MG) BY MOUTH DAILY 90 tablet 1   Ascorbic Acid (VITAMIN C) 500 MG CAPS Take 500 mg by mouth daily.     aspirin EC 81 MG tablet Take 1 tablet (81 mg total) by mouth daily. 30 tablet 11   B Complex-C (B-COMPLEX WITH VITAMIN C) tablet Take 1 tablet by mouth daily.     Cholecalciferol (VITAMIN D3) 50 MCG (2000 UT) TABS Take 2,000 Units by mouth daily.     ferrous sulfate 325 (65 FE) MG tablet Take 325 mg by mouth 2 (two) times daily.     FLUoxetine (PROZAC) 20 MG capsule  Take 20 mg by mouth daily.     gabapentin (NEURONTIN) 600 MG tablet Take 600 mg by mouth at bedtime.     glucose blood (ONETOUCH VERIO) test strip USE AS DIRECTED FOUR- FIVE TIMES DAILY 450 each 1   Insulin Glargine (BASAGLAR KWIKPEN) 100 UNIT/ML SOPN INJECT 80 UNITS INTO SKIN DAILY AT 10 PM (Patient taking differently: Inject 50-80 Units into the skin at bedtime as needed (high blood sugar).) 30 mL 2   isosorbide mononitrate (IMDUR) 30 MG 24 hr tablet TAKE 1 TABLET(30 MG) BY MOUTH TWICE DAILY 180 tablet 1   lactulose (CHRONULAC) 10 GM/15ML solution Take 15 mLs (10 g total) by mouth daily. 946 mL 0   Magnesium 250 MG TABS Take 250 mg by mouth See admin instructions. Take 250 mg in the morning, may take another 250 mg dose up to 3 more times a day as needed for muscle cramps     Menthol-Methyl Salicylate (MUSCLE RUB) 10-15 % CREA Apply 1 Application topically as needed for muscle pain.     Multiple Vitamin (MULTIVITAMIN WITH MINERALS) TABS tablet Take 1 tablet by mouth in the morning and at bedtime.     nitroGLYCERIN (NITROSTAT) 0.4 MG SL tablet Place 1 tablet (0.4 mg total) under the tongue every 5 (five) minutes x 3 doses as needed for chest pain. 25 tablet 3   ONETOUCH VERIO test  strip USE TO TEST FOUR TIMES DAILY AS DIRECTED 450 each 2   Oxycodone HCl 10 MG TABS Take 10 mg by mouth 2 (two) times daily as needed (pain).     pantoprazole (PROTONIX) 40 MG tablet TAKE 1 TABLET BY MOUTH DAILY BEFORE BREAKFAST 90 tablet 0   Pediatric Multivitamins-Iron (FLINTSTONES COMPLETE PO) Take 1 tablet by mouth daily.     simvastatin (ZOCOR) 10 MG tablet TAKE 1 TABLET BY MOUTH EVERY NIGHT AT BEDTIME 30 tablet 2   Sodium Bicarbonate-Citric Acid (ALKA-SELTZER HEARTBURN) 1940-1000 MG TBEF Take 1-2 tablets by mouth 2 (two) times daily as needed (heartburn/indigestion).     warfarin (COUMADIN) 5 MG tablet TAKE 1 AND 1/2 TABLETS BY MOUTH DAILY EXCEPT 1 TABLET ON TUESDAYS, THURSDAY AND SATURDAYS OR AS DIRECTED 45 tablet 5   cetirizine (ZYRTEC) 10 MG tablet Take 10 mg by mouth daily as needed for allergies. (Patient not taking: Reported on 12/18/2022)     No current facility-administered medications for this visit.    Allergies as of 12/18/2022   (No Known Allergies)    Family History  Problem Relation Age of Onset   Pancreatic cancer Maternal Grandmother    Obesity Mother    Hypertension Mother    Diabetes Father    Thyroid disease Father    Heart disease Father    Colon cancer Neg Hx     Social History   Socioeconomic History   Marital status: Married    Spouse name: Not on file   Number of children: Not on file   Years of education: Not on file   Highest education level: Not on file  Occupational History   Not on file  Tobacco Use   Smoking status: Former    Current packs/day: 0.00    Average packs/day: 1 pack/day for 23.0 years (23.0 ttl pk-yrs)    Types: Cigarettes    Start date: 10/30/1970    Quit date: 10/29/1993    Years since quitting: 29.1   Smokeless tobacco: Former    Types: Snuff, Chew  Vaping Use   Vaping status: Never Used  Substance and Sexual Activity   Alcohol use: No    Alcohol/week: 0.0 standard drinks of alcohol   Drug use: No   Sexual activity:  Never    Birth control/protection: None  Other Topics Concern   Not on file  Social History Narrative   Not on file   Social Determinants of Health   Financial Resource Strain: High Risk (10/17/2020)   Overall Financial Resource Strain (CARDIA)    Difficulty of Paying Living Expenses: Hard  Food Insecurity: No Food Insecurity (11/14/2022)   Hunger Vital Sign    Worried About Running Out of Food in the Last Year: Never true    Ran Out of Food in the Last Year: Never true  Transportation Needs: No Transportation Needs (11/14/2022)   PRAPARE - Administrator, Civil Service (Medical): No    Lack of Transportation (Non-Medical): No  Physical Activity: Sufficiently Active (10/17/2020)   Exercise Vital Sign    Days of Exercise per Week: 7 days    Minutes of Exercise per Session: 60 min  Stress: Stress Concern Present (10/17/2020)   [employer] of Occupational Health - Occupational Stress Questionnaire    Feeling of Stress : Very much  Social Connections: Unknown (09/30/2021)   Received from [facility]   Social Network    Social Network: Not on file  Intimate Partner Violence: Not At Risk (11/14/2022)   Humiliation, Afraid, Rape, and Kick questionnaire    Fear of Current or Ex-Partner: No    Emotionally Abused: No    Physically Abused: No    Sexually Abused: No    Review of Systems: Gen: Denies any fever, chills, cold or flulike symptoms, presyncope, syncope. CV: Denies chest pain, heart palpitations. Resp: Denies shortness of breath, cough. GI: See HPI GU : Denies urinary burning, urinary frequency, urinary hesitancy MS: Denies joint pain. Derm: Denies rash. Psych: Denies depression, anxiety. Heme: See HPI  Physical Exam: BP (!) 119/58 (BP Location: Right Arm, Patient Position: Sitting, Cuff Size: Large)   Pulse 80   Temp 97.6 F (36.4 C) (Temporal)   Ht 6\' 3"  (1.905 m)   Wt 219 lb 6.4 oz (99.5 kg)   SpO2 96%   BMI 27.42 kg/m  General:    Alert and oriented. Pleasant and cooperative. Well-nourished and well-developed.  Head:  Normocephalic and atraumatic. Eyes:  Without icterus, sclera clear and conjunctiva pink.  Ears:  Normal auditory acuity. Lungs:  Clear to auscultation bilaterally. No wheezes, rales, or rhonchi. No distress.  Heart:  S1, S2 present without murmurs appreciated.  Abdomen:  +BS, soft, non-tender and non-distended. No HSM noted. No guarding or rebound. No masses appreciated.  Rectal:  Deferred  Msk:  Symmetrical without gross deformities. Normal posture. Extremities:  Without edema. Neurologic:  Alert and  oriented x4;  grossly normal neurologically. Skin:  Intact without significant lesions or rashes. Psych: Normal mood and affect.    Assessment:  67 year old male with history of anemia, anxiety, aortic stenosis, atrial fibrillation chronically anticoagulated with warfarin, CHF, CAD, CABG, HTN, HLD, low-grade NHL, lupus anticoagulant positive, PVD, diabetes, presence of pacemaker, GERD, PUD, cirrhosis, presenting today to discuss scheduling colonoscopy.  Also discussed cirrhosis, constipation, and black stool.  Colon cancer screening: Overdue.  Reporting some mild constipation discussed below.  Denies BRBPR or unintentional weight loss.  No known family history of colon cancer.  Last colonoscopy in 2008 with left colonic diverticulosis.  Constipation: Mild.  Will start lactulose due to his history of  cirrhosis and reports of some forgetfulness though I doubt this is related to hepatic encephalopathy.  Cirrhosis Previously felt to be secondary to fatty liver.  Patient was lost to follow-up, has not been seen since 2018.  Remains well compensated.  MELD 3.0 is 14 based on labs completed 11/25/2022 though this is influenced by elevated INR secondary to warfarin.  He is overdue for ultrasound for HCC screening and EGD for variceal screening.  Last EGD in 2016 without varices.  He does have chronic  thrombocytopenia though interestingly platelets were normal in July; however, they were 121 in June and 105 in August 2023.  He has no overt confusion or recent mental status change.  Reports he can be forgetful.  Doubt this is related to hepatic encephalopathy/increased ammonia levels, but will go ahead and start lactulose as he does have some mild constipation.  He has natural immunity to hepatitis A but will need vaccination to hepatitis B.  Black stool: Chronic, intermittent black stools per patient.  He is chronically on iron which is likely the etiology of his symptoms.  Most recent hemoglobin is stable at 11.8.    Anemia : Chronic history of mild normocytic anemia with hemoglobin in the 11-12 range dating back to 2021 though no documented iron deficiency that I can see. He is on oral iron daily. He does have known history of peptic ulcer disease with last EGD in 2016 with persistent ulcer.  Last colonoscopy in 2008 without polyps.  As we are already planning for colonoscopy for screening purposes and EGD for variceal screening, this will also help to rule out GI etiology of persistent mild anemia.   Plan:  Proceed with upper endoscopy + colonoscopy with propofol by Dr. Marletta Lor in near future. The risks, benefits, and alternatives have been discussed with the patient in detail. The patient states understanding and desires to proceed.  ASA 3 We will reach cardiology for management of warfarin perioperatively.  He is going to need bridging. Hold iron x 7 days prior to procedures. 1 day prior: One half dose of insulin at bedtime Day of procedure: No morning diabetes medications Abdominal ultrasound Start lactulose 15 mL daily with goal of 2 bowel movements daily. Needs hepatitis B vaccination. Follow-up in 3 months or sooner if needed.   Ermalinda Memos, PA-C [facility] 12/18/2022

## 2022-12-18 ENCOUNTER — Telehealth: Payer: Self-pay | Admitting: *Deleted

## 2022-12-18 ENCOUNTER — Encounter: Payer: Self-pay | Admitting: Gastroenterology

## 2022-12-18 ENCOUNTER — Encounter: Payer: Self-pay | Admitting: *Deleted

## 2022-12-18 ENCOUNTER — Ambulatory Visit (INDEPENDENT_AMBULATORY_CARE_PROVIDER_SITE_OTHER): Payer: Medicare PPO | Admitting: Gastroenterology

## 2022-12-18 VITALS — BP 119/58 | HR 80 | Temp 97.6°F | Ht 75.0 in | Wt 219.4 lb

## 2022-12-18 DIAGNOSIS — K5900 Constipation, unspecified: Secondary | ICD-10-CM

## 2022-12-18 DIAGNOSIS — K921 Melena: Secondary | ICD-10-CM

## 2022-12-18 DIAGNOSIS — K7469 Other cirrhosis of liver: Secondary | ICD-10-CM | POA: Diagnosis not present

## 2022-12-18 DIAGNOSIS — Z1211 Encounter for screening for malignant neoplasm of colon: Secondary | ICD-10-CM

## 2022-12-18 DIAGNOSIS — D649 Anemia, unspecified: Secondary | ICD-10-CM

## 2022-12-18 MED ORDER — LACTULOSE 10 GM/15ML PO SOLN
10.0000 g | Freq: Every day | ORAL | 0 refills | Status: DC
Start: 2022-12-18 — End: 2023-07-14

## 2022-12-18 NOTE — Telephone Encounter (Signed)
  Request for patient to stop medication prior to procedure or is needing cleareance  12/18/22  Kelly [name] 12-29-1955  What type of surgery is being performed? Colonoscopy/Esophagogastroduodenoscopy (EGD)  When is surgery scheduled? TBD  What type of clearance is required (medical or pharmacy to hold medication or both? medication  Needs cardiology assistance for warfarin management/possible bridging prior to procedure.  Name of physician performing surgery?  Dr.Carver [facility] at [employer]: [phone] Fax: [phone]  Anethesia type (none, local, MAC, general)? MAC

## 2022-12-18 NOTE — Patient Instructions (Addendum)
We will arrange her to have an upper endoscopy and colonoscopy in the near future with Dr. Marletta Lor at [facility]. We will reach out to your cardiologist regarding warfarin management around the time of your procedures. You will need to hold iron for 7 days prior to your procedures. Take one half dose of insulin at bedtime the night prior to your procedures.  We will also schedule for you to have an ultrasound of your abdomen due to your history of cirrhosis.  Start lactulose 15 mL daily with a goal of 2 bowel movements every day.  Hepatitis B if you have not been.  Your blood work in 2017 showed that you are not immune to hepatitis B.  We will plan for follow-up in the office in 3 months due to history of cirrhosis or sooner if Dr. Marletta Lor recommends this.  It was very nice to meet you today!  Ermalinda Memos, PA-C [facility]

## 2022-12-19 ENCOUNTER — Encounter: Payer: Self-pay | Admitting: Gastroenterology

## 2022-12-19 DIAGNOSIS — K921 Melena: Secondary | ICD-10-CM | POA: Insufficient documentation

## 2022-12-19 DIAGNOSIS — K5900 Constipation, unspecified: Secondary | ICD-10-CM | POA: Insufficient documentation

## 2022-12-19 NOTE — Telephone Encounter (Signed)
   Name: CURRIE DENNIN  DOB: 05-11-1956  MRN: [id number]  Primary Cardiologist: Nona Dell, MD  Chart reviewed as part of pre-operative protocol coverage. Because of Reace G Juste's past medical history and time since last visit, he will require a follow-up in-office visit in order to better assess preoperative cardiovascular risk.  Patient is scheduled to Dr. Dr. Diona Browner on 8/8. Appointment notes have been updated to reflect need for preoperative evaluation.   Pre-op covering staff:  - Please contact requesting surgeon's office via preferred method (i.e, phone, fax) to inform them of need for appointment prior to surgery.  This message will also be routed to pharmacy pool for input on holding coumadin as requested below so that this information is available to the clearing provider at time of patient's appointment.   Carlos Levering, NP  12/19/2022, 3:58 PM

## 2022-12-19 NOTE — Telephone Encounter (Signed)
Please advise holding Coumadin prior to Colonoscopy/EGD not yet scheduled.   Thank you!  DW

## 2022-12-19 NOTE — Telephone Encounter (Signed)
Patient with diagnosis of afib, recurrent DVTs, and Lupus + anticoagulant  on warfarin for anticoagulation.    Procedure:  Colonoscopy/Esophagogastroduodenoscopy  Date of procedure: TBD  CrCl 93 ml/min Platelet count 121  Per office protocol, patient can hold warfarin for 5 days prior to procedure.   Patient WILL need bridging with Lovenox (enoxaparin) around procedure. I will copy [facility]. Patient should let Misty Stanley know as soon as procedure is scheduled so bridge can be coordinated.  **This guidance is not considered finalized until pre-operative APP has relayed final recommendations.**

## 2022-12-22 ENCOUNTER — Ambulatory Visit: Payer: Medicare PPO | Admitting: Cardiology

## 2022-12-22 ENCOUNTER — Encounter: Payer: Self-pay | Admitting: Cardiology

## 2022-12-22 VITALS — BP 118/62 | HR 62 | Ht 76.0 in | Wt 214.4 lb

## 2022-12-22 DIAGNOSIS — I442 Atrioventricular block, complete: Secondary | ICD-10-CM

## 2022-12-22 DIAGNOSIS — Z95810 Presence of automatic (implantable) cardiac defibrillator: Secondary | ICD-10-CM

## 2022-12-22 DIAGNOSIS — I480 Paroxysmal atrial fibrillation: Secondary | ICD-10-CM | POA: Diagnosis not present

## 2022-12-22 DIAGNOSIS — I82409 Acute embolism and thrombosis of unspecified deep veins of unspecified lower extremity: Secondary | ICD-10-CM

## 2022-12-22 DIAGNOSIS — T82110A Breakdown (mechanical) of cardiac electrode, initial encounter: Secondary | ICD-10-CM

## 2022-12-22 DIAGNOSIS — I5022 Chronic systolic (congestive) heart failure: Secondary | ICD-10-CM

## 2022-12-22 NOTE — Telephone Encounter (Signed)
Will schedule once we get providers September schedule

## 2022-12-22 NOTE — Telephone Encounter (Signed)
Okay to proceed with scheduling.  You will need to reach Vashti Hey with [facility] ASAP once you get patient scheduled so she can coordinate Lovenox bridging.

## 2022-12-22 NOTE — Telephone Encounter (Signed)
Please advise. Thank you

## 2022-12-22 NOTE — Progress Notes (Signed)
Electrophysiology Office Follow up Visit Note:    Date:  12/22/2022   ID:  Kelly [name], DOB 02/08/1956, MRN [id number]  PCP:  Benita Stabile, MD  [facility] Cardiologist:  Nona Dell, MD  [facility] Electrophysiologist:  Lanier Prude, MD    Interval History:    [name] is a 67 y.o. male who presents for a follow up visit.   He had an RV lead extraction and upgrade to CRT-D on November 14, 2022.  He tolerated the procedure well.  His device has healed well.  He is being worked up for left shoulder discomfort that is secondary to a bone spur.  After his extraction procedure the right atrial lead has had an increased capture threshold with stable sensing and impedance values.     Past medical, surgical, social and family history were reviewed.  ROS:   Please see the history of present illness.    All other systems reviewed and are negative.  EKGs/Labs/Other Studies Reviewed:    The following studies were reviewed today:  December 22, 2022 in clinic device interrogation personally reviewed With final programming, battery longevity 4.9 years I did program his RV lead to subthreshold and and functionally pacing the left ventricle only via the left bundle area lead Atrial capture threshold 2.5 V at 1 ms Auto capture enabled for the left bundle area lead  EKG Interpretation Date/Time:  Monday December 22 2022 15:28:51 EDT Ventricular Rate:  62 PR Interval:  176 QRS Duration:  154 QT Interval:  454 QTC Calculation: 460 R Axis:   -29  Text Interpretation: AV dual-paced rhythm Biventricular pacemaker detected Confirmed by [name] [phone]) on 12/22/2022 4:46:54 PM    Physical Exam:    VS:  BP 118/62   Pulse 62   Ht 6\' 4"  (1.93 m)   Wt 214 lb 6.4 oz (97.3 kg)   SpO2 98%   BMI 26.10 kg/m     Wt Readings from Last 3 Encounters:  12/22/22 214 lb 6.4 oz (97.3 kg)  12/18/22 219 lb 6.4 oz (99.5 kg)  11/14/22 213 lb (96.6 kg)     GEN:  Well  nourished, well developed in no acute distress CARDIAC: RRR, no murmurs, rubs, gallops.  Medial aspect of incision with small erythematous lesion that is not raised or draining.  Approximately 2 to 3 mm in diameter.  It is superficial.  There is no fluctuance in the pocket.  I suspect this is an ingrown hair. RESPIRATORY:  Clear to auscultation without rales, wheezing or rhonchi       ASSESSMENT:    1. Paroxysmal atrial fibrillation (HCC)   2. Heart block AV complete (HCC)   3. Failure of pacemaker lead, initial encounter   4. Recurrent deep vein thrombosis (HCC)   5. ICD (implantable cardioverter-defibrillator) in place   6. Chronic systolic heart failure (HCC)    PLAN:    In order of problems listed above:  #CRT-D in situ #Complete heart block The patient is doing well after his RV pacemaker lead extraction and CRT-D upgrade November 14, 2022.  The new leads are functioning appropriately.  His right atrial lead has had an increased capture threshold but this is stable on the trend.  We will continue to monitor this.  I did discuss the possibility that in the future he would need a new right atrial lead.  #Chronic systolic heart failure NYHA class II today.  Warm and dry on exam.  Continue current  medications including Imdur He has an appointment August 8 with Dr. Diona Browner in [place] to discuss further GDMT for his systolic heart failure.  #Recurrent DVT #Paroxysmal atrial fibrillation On warfarin, continue  Follow-up with me in 1 year or sooner as needed       Signed, [name], MD, [facility], [facility] 12/22/2022 4:52 PM    Electrophysiology [facility]

## 2022-12-22 NOTE — Patient Instructions (Addendum)
Medication Instructions:  Your physician recommends that you continue on your current medications as directed. Please refer to the Current Medication list given to you today.  *If you need a refill on your cardiac medications before your next appointment, please call your pharmacy*  Follow-Up: At [facility], you and your health needs are our priority.  As part of our continuing mission to provide you with exceptional heart care, we have created designated Provider Care Teams.  These Care Teams include your primary Cardiologist (physician) and Advanced Practice Providers (APPs -  Physician Assistants and Nurse Practitioners) who all work together to provide you with the care you need, when you need it.  Your next appointment:   6 months  Provider:   You may see Lanier Prude, MD or one of the following Advanced Practice Providers on your designated Care Team:   Francis Dowse, [place] [address] [place], [place] Sherie Don, NP Canary Brim, NP

## 2022-12-24 ENCOUNTER — Encounter: Payer: Self-pay | Admitting: Orthopedic Surgery

## 2022-12-24 ENCOUNTER — Ambulatory Visit (INDEPENDENT_AMBULATORY_CARE_PROVIDER_SITE_OTHER): Payer: Medicare PPO | Admitting: Orthopedic Surgery

## 2022-12-24 VITALS — BP 115/70 | HR 62 | Ht 75.5 in | Wt 210.0 lb

## 2022-12-24 DIAGNOSIS — M25512 Pain in left shoulder: Secondary | ICD-10-CM

## 2022-12-24 MED ORDER — METHYLPREDNISOLONE ACETATE 40 MG/ML IJ SUSP
40.0000 mg | Freq: Once | INTRAMUSCULAR | Status: AC
  Administered 2022-12-24: 40 mg via INTRA_ARTICULAR

## 2022-12-24 NOTE — Progress Notes (Unsigned)
Cardiology Office Note  Date: 12/25/2022   ID: SULAYMAN NABOZNY, DOB [birth year], MRN [id number]  History of Present Illness: Kelly [name] is a 67 y.o. male last seen in December 2023.  He has had interval follow-up with EP, most recently seen by Dr. Lalla Brothers just a few days ago, reviewed the note as well as interval chart and testing.  Since I saw him he had a follow-up echocardiogram showing evidence of cardiomyopathy with LVEF 30 to 35% and global hypokinesis.  He is here today with his wife for a routine visit.  Reports no angina or nitroglycerin use since April.  Does have some orthostatic symptoms especially when he bends over and stands up, no palpitations or syncope however.  He has had some recurring right leg discomfort mainly following prior lymph node resection.  Weight is relatively stable.  He is being considered for EGD and colonoscopy with Dr. Marletta Lor.  This has not yet been scheduled.  St. Jude CRT-D in place now with follow-up by Dr. Lalla Brothers.  He does not report any device shocks or syncope.  He is on Coumadin with follow-up in the anticoagulation clinic.  Plan to help coordinate bridging with Lovenox when he comes off Coumadin for EGD and colonoscopy.  Today we went over his medications and discussed adjustments in terms of GDMT for cardiomyopathy.  He does tend to run a normal to low normal blood pressure which will likely be a limitation.  Physical Exam: VS:  BP 120/60   Pulse 60   Ht 6\' 4"  (1.93 m)   Wt 213 lb (96.6 kg)   SpO2 96%   BMI 25.93 kg/m , BMI Body mass index is 25.93 kg/m.  Wt Readings from Last 3 Encounters:  12/25/22 213 lb (96.6 kg)  12/24/22 210 lb (95.3 kg)  12/22/22 214 lb 6.4 oz (97.3 kg)    General: Patient appears comfortable at rest. HEENT: Conjunctiva and lids normal. Neck: Supple, no elevated JVP or carotid bruits. Lungs: Clear to auscultation, nonlabored breathing at rest. Cardiac: Regular rate and rhythm, no S3, 2/6 systolic  murmur. Abdomen: Bowel sounds present. Extremities: Mild lower leg edema, right worse than left..  ECG:  An ECG dated 12/22/2022 was personally reviewed today and demonstrated:  Dual-chamber pacing.  Labwork: 11/07/2022: BUN 13; Creatinine, Ser 0.94; Hemoglobin 12.2; Platelets 121; Potassium 4.3; Sodium 142  July 2024: Hemoglobin 11.8, platelets 170, BUN 13, creatinine 0.99, potassium 4.4, AST 34, ALT 25, cholesterol 107, triglycerides 69, HDL 49, LDL 43, hemoglobin A1c 7.3%  Other Studies Reviewed Today:  Echocardiogram 11/10/2022:  1. Left ventricular ejection fraction, by estimation, is 30 to 35%. The  left ventricle has moderately decreased function. The left ventricle  demonstrates global hypokinesis. The left ventricular internal cavity size  was mildly dilated. There is mild  left ventricular hypertrophy. Left ventricular diastolic parameters are  indeterminate.   2. Right ventricular systolic function is mildly reduced. The right  ventricular size is mildly enlarged. There is normal pulmonary artery  systolic pressure. The estimated right ventricular systolic pressure is  21.3 mmHg.   3. The mitral valve is normal in structure. Mild mitral valve  regurgitation. No evidence of mitral stenosis.   4. The aortic valve is calcified. There is severe calcifcation of the  aortic valve. Aortic valve regurgitation is not visualized. Moderate to  severe aortic valve stenosis. Vmax 2.8 m/s, MG 18 mmHg, AVA 1.0 cm^2, DI  0.25. Low flow low gradient moderate  to severe AS  5. The inferior vena cava is normal in size with greater than 50%  respiratory variability, suggesting right atrial pressure of 3 mmHg.   Assessment and Plan:  1.  CAD status post BMS to the LAD and angioplasty of the RCA in 2000, BMS to the RCA/PLA in 2001, and ultimately CABG in 2018 with a LIMA to LAD, SVG to OM 2, and SVG to PDA.  He does not report any progressive angina or recent nitroglycerin use.  Continue aspirin  and Zocor.  Reduce Imdur to 30 mg once daily.  2.  HFrEF with LVEF 30 to 35% by echocardiogram in June, overall global hypokinesis, mild RV dysfunction with normal estimated RVSP.  Plan to begin adjustments in GDMT as tolerated.  Stop Norvasc and reduce Imdur to 30 mg daily as discussed above.  After a few days he will initiate Entresto 24/26 mg twice daily.  Check BMET in 10 days.  Might be able to add Aldactone or SGLT2 inhibitor next.  Clinical follow-up arranged.  3.  Moderate to severe aortic stenosis with mean AV gradient 18 mmHg and dimensionless index 0.25 consistent with low-flow/low gradient stenosis.  Continue to follow.  We did discuss the possibility of TAVR evaluation ultimately.  4.  St. Jude CRT-D in place with recent upgrade by Dr. Lalla Brothers in June.  No device shocks or syncope.  5.  Paroxysmal atrial fibrillation with CHA2DS2-VASc score of 4.  He is on Coumadin with follow-up in the anticoagulation clinic.  Being considered for EGD and colonoscopy with Dr. Marletta Lor at [facility], procedure not yet scheduled.  We will need to coordinate bridging with Lovenox when he comes off Coumadin and I will forward this to our anticoagulation clinic.  6.  Essential hypertension.  7.  Mixed hyperlipidemia.  LDL 43 in July on Zocor.  Disposition:  Follow up  4 to 6 weeks.  Signed, Jonelle Sidle, M.D., F.A.C.C. [facility] at [facility]

## 2022-12-24 NOTE — Patient Instructions (Addendum)
Continue with light duty at work.  Limit lifting to no more than 10 pounds at a time.  No pushing or pulling.  I will see him back in 6 weeks..  Please provide updated note for him until this time.    Instructions Following Joint Injections  In clinic today, you received an injection in one of your joints (sometimes more than one).  Occasionally, you can have some pain at the injection site, this is normal.  You can place ice at the injection site, or take over-the-counter medications such as Tylenol (acetaminophen) or Advil (ibuprofen).  Please follow all directions listed on the bottle.  If your joint (knee or shoulder) becomes swollen, red or very painful, please contact the clinic for additional assistance.   Two medications were injected, including lidocaine and a steroid (often referred to as cortisone).  Lidocaine is effective almost immediately but wears off quickly.  However, the steroid can take a few days to improve your symptoms.  In some cases, it can make your pain worse for a couple of days.  Do not be concerned if this happens as it is common.  You can apply ice or take some over-the-counter medications as needed.

## 2022-12-24 NOTE — Progress Notes (Signed)
New Patient Visit  Assessment: Kelly [name] is a 67 y.o. male with the following: 1. Acute pain of left shoulder  Plan: Kelly [name] has pain in the left shoulder.  Onset of pain was at the time of his pacemaker replacement.  No specific injury.  He does have a history of rotator cuff repair, which was done many years ago and he had done well.  Pain gets worse at night.  He is difficulty with overhead motion.  Radiographs are stable.  He may have a small amount of arthritis.  Sequelae from prior surgery is apparent.  We discussed his ongoing pain, and I recommended a steroid injection.  This was completed in clinic today.  He will follow-up in approximately 6 weeks.  We provided him with continued work restrictions.  Procedure note injection Left shoulder    Verbal consent was obtained to inject the left shoulder, subacromial space Timeout was completed to confirm the site of injection.  The skin was prepped with alcohol and ethyl chloride was sprayed at the injection site.  A 21-gauge needle was used to inject 40 mg of Depo-Medrol and 1% lidocaine (3 cc) into the subacromial space of the left shoulder using a posterolateral approach.  There were no complications. A sterile bandage was applied.   Follow-up: Return in about 6 weeks (around 02/04/2023).  Subjective:  Chief Complaint  Patient presents with   Shoulder Pain    Left- had new pacemaker placed 6/28 started hurting after that motion is not good can't sleep at night for the constant pain    History of Present Illness: Kelly [name] is a 67 y.o. male who has been referred by  Leone Payor, FNP for evaluation of left shoulder pain.  He has a history of rotator cuff repair on the left shoulder.  This was done many years ago.  He has done well.  Approximately 6-8 weeks ago, he had his pacemaker replaced, and since then he has had pain in the left shoulder.  It has resulted him in a limited duty at work.  His cardiologist has  provided him with documentation limiting his lifting nothing more than 10 pounds.  Pain is diffuse.  He has pain in the anterior and lateral aspect of the shoulder.  He is difficulty with overhead motion.  Pain gets worse at night.  Tylenol has not provided sufficient relief of his symptoms.   Review of Systems: No fevers or chills No numbness or tingling No chest pain No shortness of breath No bowel or bladder dysfunction No GI distress No headaches   Medical History:  Past Medical History:  Diagnosis Date   Anemia    Anxiety    Aortic stenosis    Arthritis    Atrial fibrillation (HCC)    Atrial fibrillation (HCC)    CHF (congestive heart failure) (HCC)    Chronic pain    Cirrhosis (HCC)    Coronary atherosclerosis of native coronary artery    BMS LAD and PTCA RCA 2000,BMS RCA/PLA 2001, CABG June 2018   Depression    Diverticulitis    Essential hypertension    Fatty liver    Gastric ulcer    GERD (gastroesophageal reflux disease)    Hyperlipidemia    Low-grade NHL (non-Hodgkin's lymphoma) 2007   Lupus anticoagulant positive    MI (myocardial infarction) (HCC) 2001   Pacemaker    Paresthesia of upper limb    Peripheral neuropathy    Peripheral vascular disease (HCC)  blood clots in legs   Thrombocytopenia (HCC)    Trifascicular block    Type 2 diabetes mellitus (HCC)    Umbilical hernia     Past Surgical History:  Procedure Laterality Date   Axillary abscess     left incision and drainage left   BACK SURGERY     multiple   BIOPSY  08/15/2014   Procedure: BIOPSY;  Surgeon: West Bali, MD;  Location: [facility];  Service: Endoscopy;;  Gastric   BIOPSY  01/19/2015   Procedure: BIOPSY (GASTRIC ULCER);  Surgeon: West Bali, MD;  Location: [facility];  Service: Endoscopy;;   CARDIAC CATHETERIZATION     cardiac stents     x5 stents   COLONOSCOPY  June 2008   Dr. Tinnie Gens Medoff: Mild left colonic diverticulosis   CORONARY ARTERY BYPASS GRAFT N/A 11/11/2016    Procedure: CORONARY ARTERY BYPASS GRAFTING (CABG) x 3, USING LEFT MAMMARY ARTERY AND RIGHT GREATER SAPHENOUS VEIN HARVESTED ENDOSCOPICALLY;  Surgeon: Kerin Perna, MD;  [facility];  Service: Open Heart Surgery;  Laterality: N/A;   CORONARY PRESSURE/FFR STUDY N/A 09/26/2016   Procedure: Intravascular Pressure Wire/FFR Study;  Surgeon: Yvonne Kendall, MD;  Location: [facility] INVASIVE CV LAB;  Service: Cardiovascular;  Laterality: N/A;   Dental extractions     ESOPHAGOGASTRODUODENOSCOPY  June 2008   Dr. Tinnie Gens Medoff: Gastric ulcer, antral, biopsy with reactive changes associated glandular atrophy, no H pylori. No features of lymphoma. Likely NSAID related   ESOPHAGOGASTRODUODENOSCOPY  August 2008   Dr. Tinnie Gens Medoff: Complete healing of gastric ulcers.   ESOPHAGOGASTRODUODENOSCOPY (EGD) WITH PROPOFOL N/A 08/15/2014   SLF: 1. Abnormal pain due to ulcers & gastriitis    ESOPHAGOGASTRODUODENOSCOPY (EGD) WITH PROPOFOL N/A 01/19/2015   SLF: 1. Persistant ulcer with firm base 2. single gastric polyp found in the gastric antrum. Ulcer base    EUS N/A 03/21/2015   Thickened gastric wall in pre-pyloric antrum, no obvious intramural mass, fatty pancreas, no obvious pancreatic mass.    Inguinal lymph node biopsy     right inguinal lymph node biopsy   LEAD EXTRACTION N/A 11/14/2022   Procedure: LEAD EXTRACTION;  Surgeon: Lanier Prude, MD;  Location: [facility] INVASIVE CV LAB;  Service: Cardiovascular;  Laterality: N/A;   LEFT HEART CATH AND CORONARY ANGIOGRAPHY N/A 09/26/2016   Procedure: Left Heart Cath and Coronary Angiography;  Surgeon: Yvonne Kendall, MD;  Location: [facility] INVASIVE CV LAB;  Service: Cardiovascular;  Laterality: N/A;   LEFT HEART CATH AND CORS/GRAFTS ANGIOGRAPHY N/A 06/29/2020   Procedure: LEFT HEART CATH AND CORS/GRAFTS ANGIOGRAPHY;  Surgeon: Tonny Bollman, MD;  Location: [facility] INVASIVE CV LAB;  Service: Cardiovascular;  Laterality: N/A;   NECK SURGERY     PACEMAKER IMPLANT N/A 03/12/2020    Procedure: PACEMAKER IMPLANT;  Surgeon: Lanier Prude, MD;  Location: [facility] INVAS[facility] CV LAB;  Service: Cardiovascular;  Laterality: N/A;   POLYPECTOMY  01/19/2015   Procedure: POLYPECTOMY (GASTRIC);  Surgeon: West Bali, MD;  Location: [facility];  Service: Endoscopy;;   ROUX-EN-Y GASTRIC BYPASS     RNY gastric bypass   SHOULDER ARTHROSCOPY Left    TEE WITHOUT CARDIOVERSION N/A 11/11/2016   Procedure: TRANSESOPHAGEAL ECHOCARDIOGRAM (TEE);  Surgeon: Donata Clay, Theron Arista, MD;  Location[facility] OR;  Service: Open Heart Surgery;  Laterality: N/A;    Family History  Problem Relation Age of Onset   Pancreatic cancer Maternal Grandmother    Obesity Mother    Hypertension Mother    Diabetes Father  Thyroid disease Father    Heart disease Father    Colon cancer Neg Hx    Social History   Tobacco Use   Smoking status: Former    Current packs/day: 0.00    Average packs/day: 1 pack/day for 23.0 years (23.0 ttl pk-yrs)    Types: Cigarettes    Start date: 10/30/1970    Quit date: 10/29/1993    Years since quitting: 29.1   Smokeless tobacco: Former    Types: Snuff, Chew  Vaping Use   Vaping status: Never Used  Substance Use Topics   Alcohol use: No    Alcohol/week: 0.0 standard drinks of alcohol   Drug use: No    No Known Allergies  Current Meds  Medication Sig   acetaminophen (TYLENOL) 500 MG tablet Take 500-1,000 mg by mouth every 6 (six) hours as needed (for pain.).   amLODipine (NORVASC) 2.5 MG tablet TAKE 1 TABLET(2.5 MG) BY MOUTH DAILY   Ascorbic Acid (VITAMIN C) 500 MG CAPS Take 500 mg by mouth daily.   aspirin EC 81 MG tablet Take 1 tablet (81 mg total) by mouth daily.   B Complex-C (B-COMPLEX WITH VITAMIN C) tablet Take 1 tablet by mouth daily.   cetirizine (ZYRTEC) 10 MG tablet Take 10 mg by mouth daily as needed for allergies.   Cholecalciferol (VITAMIN D3) 50 MCG (2000 UT) TABS Take 2,000 Units by mouth daily.   ferrous sulfate 325 (65 FE) MG tablet Take 325 mg by mouth 2 (two)  times daily.   FLUoxetine (PROZAC) 20 MG capsule Take 20 mg by mouth daily.   gabapentin (NEURONTIN) 600 MG tablet Take 600 mg by mouth at bedtime.   glucose blood (ONETOUCH VERIO) test strip USE AS DIRECTED FOUR- FIVE TIMES DAILY   Insulin Glargine (BASAGLAR KWIKPEN) 100 UNIT/ML SOPN INJECT 80 UNITS INTO SKIN DAILY AT 10 PM (Patient taking differently: Inject 50-80 Units into the skin at bedtime as needed (high blood sugar).)   isosorbide mononitrate (IMDUR) 30 MG 24 hr tablet TAKE 1 TABLET(30 MG) BY MOUTH TWICE DAILY   lactulose (CHRONULAC) 10 GM/15ML solution Take 15 mLs (10 g total) by mouth daily.   Magnesium 250 MG TABS Take 250 mg by mouth See admin instructions. Take 250 mg in the morning, may take another 250 mg dose up to 3 more times a day as needed for muscle cramps   Menthol-Methyl Salicylate (MUSCLE RUB) 10-15 % CREA Apply 1 Application topically as needed for muscle pain.   Multiple Vitamin (MULTIVITAMIN WITH MINERALS) TABS tablet Take 1 tablet by mouth in the morning and at bedtime.   nitroGLYCERIN (NITROSTAT) 0.4 MG SL tablet Place 1 tablet (0.4 mg total) under the tongue every 5 (five) minutes x 3 doses as needed for chest pain.   ONETOUCH VERIO test strip USE TO TEST FOUR TIMES DAILY AS DIRECTED   Oxycodone HCl 10 MG TABS Take 10 mg by mouth 2 (two) times daily as needed (pain).   pantoprazole (PROTONIX) 40 MG tablet TAKE 1 TABLET BY MOUTH DAILY BEFORE BREAKFAST   Pediatric Multivitamins-Iron (FLINTSTONES COMPLETE PO) Take 1 tablet by mouth daily.   simvastatin (ZOCOR) 10 MG tablet TAKE 1 TABLET BY MOUTH EVERY NIGHT AT BEDTIME   Sodium Bicarbonate-Citric Acid (ALKA-SELTZER HEARTBURN) 1940-1000 MG TBEF Take 1-2 tablets by mouth 2 (two) times daily as needed (heartburn/indigestion).   warfarin (COUMADIN) 5 MG tablet TAKE 1 AND 1/2 TABLETS BY MOUTH DAILY EXCEPT 1 TABLET ON TUESDAYS, THURSDAY AND SATURDAYS OR AS DIRECTED  Objective: BP 115/70   Pulse 62   Ht 6' 3.5" (1.918 m)    Wt 210 lb (95.3 kg)   BMI 25.90 kg/m   Physical Exam:  General: Elderly male., Alert and oriented., and No acute distress. Gait: Normal gait.  Anterior base surgical incisions have healed.  No surrounding erythema or drainage.  Tenderness palpation of the anterior shoulder.  He also has tenderness to palpation posterior aspect of the shoulder.  Active forward flexion to 110 degrees.  Passively, I can get him to 160 degrees but he has a lot of pain.  Tenderness over the biceps tendon proximally.  Positive O'Brien's.  Positive Jobe's.  Negative belly press.  Pacemaker is palpable and visible just medial to the glenohumeral joint.  IMAGING: I personally reviewed images previously obtained in clinic  Prior x-rays of the left shoulder are without acute injury.  Minimal degenerative changes, although views are obscured by the pacemaker.   New Medications:  Meds ordered this encounter  Medications   methylPREDNISolone acetate (DEPO-MEDROL) injection 40 mg      Oliver Barre, MD  12/24/2022 4:11 PM

## 2022-12-25 ENCOUNTER — Encounter: Payer: Self-pay | Admitting: Cardiology

## 2022-12-25 ENCOUNTER — Ambulatory Visit: Payer: Medicare PPO | Attending: Cardiology | Admitting: Cardiology

## 2022-12-25 ENCOUNTER — Ambulatory Visit: Payer: Medicare PPO | Attending: Cardiology | Admitting: *Deleted

## 2022-12-25 VITALS — BP 120/60 | HR 60 | Ht 76.0 in | Wt 213.0 lb

## 2022-12-25 DIAGNOSIS — I480 Paroxysmal atrial fibrillation: Secondary | ICD-10-CM

## 2022-12-25 DIAGNOSIS — D6869 Other thrombophilia: Secondary | ICD-10-CM | POA: Diagnosis not present

## 2022-12-25 DIAGNOSIS — Z5181 Encounter for therapeutic drug level monitoring: Secondary | ICD-10-CM | POA: Diagnosis not present

## 2022-12-25 DIAGNOSIS — I5020 Unspecified systolic (congestive) heart failure: Secondary | ICD-10-CM | POA: Diagnosis not present

## 2022-12-25 DIAGNOSIS — I350 Nonrheumatic aortic (valve) stenosis: Secondary | ICD-10-CM

## 2022-12-25 DIAGNOSIS — Z79899 Other long term (current) drug therapy: Secondary | ICD-10-CM

## 2022-12-25 DIAGNOSIS — I25119 Atherosclerotic heart disease of native coronary artery with unspecified angina pectoris: Secondary | ICD-10-CM | POA: Diagnosis not present

## 2022-12-25 LAB — POCT INR: INR: 7.4 — AB (ref 2.0–3.0)

## 2022-12-25 MED ORDER — ISOSORBIDE MONONITRATE ER 30 MG PO TB24: 30.0000 mg | ORAL_TABLET | Freq: Every day | ORAL | Status: DC

## 2022-12-25 MED ORDER — ENTRESTO 24-26 MG PO TABS: 1.0000 | ORAL_TABLET | Freq: Two times a day (BID) | ORAL | 11 refills | Status: DC

## 2022-12-25 NOTE — Patient Instructions (Addendum)
Medication Instructions:   STOP Amlodipine  DECREASE Imdur to once a day  START Entresto 24/26 mg twice a day on 12/28/22  Labwork: BMET in 10 days  Testing/Procedures: None today  Follow-Up: 4-6 weeks  Any Other Special Instructions Will Be Listed Below (If Applicable).  If you need a refill on your cardiac medications before your next appointment, please call your pharmacy.

## 2022-12-25 NOTE — Patient Instructions (Signed)
Hold warfarin x 3 days.  Take 1/2 tablet on Sunday and recheck INR on Monday 12/29/22 Denies S/S of bleeding Bleeding and fall precautions discussed with pt and he verbalized understanding.

## 2022-12-29 ENCOUNTER — Ambulatory Visit: Payer: Medicare PPO | Attending: Cardiology | Admitting: *Deleted

## 2022-12-29 ENCOUNTER — Other Ambulatory Visit: Payer: Self-pay | Admitting: *Deleted

## 2022-12-29 ENCOUNTER — Encounter: Payer: Self-pay | Admitting: *Deleted

## 2022-12-29 DIAGNOSIS — D6869 Other thrombophilia: Secondary | ICD-10-CM

## 2022-12-29 DIAGNOSIS — I480 Paroxysmal atrial fibrillation: Secondary | ICD-10-CM

## 2022-12-29 DIAGNOSIS — Z5181 Encounter for therapeutic drug level monitoring: Secondary | ICD-10-CM | POA: Diagnosis not present

## 2022-12-29 LAB — POCT INR: INR: 1.6 — AB (ref 2.0–3.0)

## 2022-12-29 MED ORDER — PEG 3350-KCL-NA BICARB-NACL 420 G PO SOLR: 4000.0000 mL | Freq: Once | ORAL | 0 refills | Status: AC

## 2022-12-29 NOTE — Telephone Encounter (Signed)
Pt has been scheduled for 02/06/23. Instructions mailed. Pt made aware to contact Coumadin Clinic in [place] for Lovenox bridging.  Prep sent to the pharmacy.  [employer] [id number] Authorization #[id number]  Tracking #[id number] Dates of service 02/06/2023 - 05/08/2023

## 2022-12-29 NOTE — Patient Instructions (Signed)
Take warfarin 2 tablets tonight then resume 1 1/2 tablets daily except 1 tablet on Tuesdays, Thursdays and Saturdays Pending Endo/Colonoscopy 9/20.  Will hold warfarin 5 days before procedure and bridge with Lovenox. Recheck in 3 wks

## 2022-12-30 ENCOUNTER — Encounter: Payer: Self-pay | Admitting: *Deleted

## 2022-12-31 ENCOUNTER — Ambulatory Visit (HOSPITAL_COMMUNITY)
Admission: RE | Admit: 2022-12-31 | Discharge: 2022-12-31 | Disposition: A | Discharge status: Home / Self Care | Payer: Medicare PPO | Source: Ambulatory Visit | Attending: Gastroenterology | Admitting: Gastroenterology

## 2022-12-31 DIAGNOSIS — K7469 Other cirrhosis of liver: Secondary | ICD-10-CM | POA: Insufficient documentation

## 2023-01-08 ENCOUNTER — Inpatient Hospital Stay: Payer: Medicare PPO | Attending: Hematology

## 2023-01-08 DIAGNOSIS — Z7901 Long term (current) use of anticoagulants: Secondary | ICD-10-CM | POA: Insufficient documentation

## 2023-01-08 DIAGNOSIS — Z86718 Personal history of other venous thrombosis and embolism: Secondary | ICD-10-CM | POA: Insufficient documentation

## 2023-01-08 DIAGNOSIS — D649 Anemia, unspecified: Secondary | ICD-10-CM | POA: Insufficient documentation

## 2023-01-08 DIAGNOSIS — C8590 Non-Hodgkin lymphoma, unspecified, unspecified site: Secondary | ICD-10-CM

## 2023-01-08 DIAGNOSIS — Z800 Family history of malignant neoplasm of digestive organs: Secondary | ICD-10-CM | POA: Insufficient documentation

## 2023-01-08 DIAGNOSIS — Z87891 Personal history of nicotine dependence: Secondary | ICD-10-CM | POA: Insufficient documentation

## 2023-01-08 DIAGNOSIS — I4891 Unspecified atrial fibrillation: Secondary | ICD-10-CM | POA: Diagnosis not present

## 2023-01-08 DIAGNOSIS — Z8572 Personal history of non-Hodgkin lymphomas: Secondary | ICD-10-CM | POA: Insufficient documentation

## 2023-01-08 DIAGNOSIS — D696 Thrombocytopenia, unspecified: Secondary | ICD-10-CM | POA: Diagnosis not present

## 2023-01-08 DIAGNOSIS — D6862 Lupus anticoagulant syndrome: Secondary | ICD-10-CM | POA: Insufficient documentation

## 2023-01-08 LAB — CBC WITH DIFFERENTIAL/PLATELET
Abs Immature Granulocytes: 0.01 10*3/uL (ref 0.00–0.07)
Basophils Absolute: 0 10*3/uL (ref 0.0–0.1)
Basophils Relative: 1 %
Eosinophils Absolute: 0.2 10*3/uL (ref 0.0–0.5)
Eosinophils Relative: 3 %
HCT: 35.8 % — ABNORMAL LOW (ref 39.0–52.0)
Hemoglobin: 12.3 g/dL — ABNORMAL LOW (ref 13.0–17.0)
Immature Granulocytes: 0 %
Lymphocytes Relative: 29 %
Lymphs Abs: 1.6 10*3/uL (ref 0.7–4.0)
MCH: 32.7 pg (ref 26.0–34.0)
MCHC: 34.4 g/dL (ref 30.0–36.0)
MCV: 95.2 fL (ref 80.0–100.0)
Monocytes Absolute: 0.5 10*3/uL (ref 0.1–1.0)
Monocytes Relative: 10 %
Neutro Abs: 3.2 10*3/uL (ref 1.7–7.7)
Neutrophils Relative %: 57 %
Platelets: 107 10*3/uL — ABNORMAL LOW (ref 150–400)
RBC: 3.76 MIL/uL — ABNORMAL LOW (ref 4.22–5.81)
RDW: 11.8 % (ref 11.5–15.5)
WBC: 5.5 10*3/uL (ref 4.0–10.5)
nRBC: 0 % (ref 0.0–0.2)

## 2023-01-08 LAB — COMPREHENSIVE METABOLIC PANEL
ALT: 49 U/L — ABNORMAL HIGH (ref 0–44)
AST: 63 U/L — ABNORMAL HIGH (ref 15–41)
Albumin: 3.5 g/dL (ref 3.5–5.0)
Alkaline Phosphatase: 76 U/L (ref 38–126)
Anion gap: 7 (ref 5–15)
BUN: 19 mg/dL (ref 8–23)
CO2: 27 mmol/L (ref 22–32)
Calcium: 8.5 mg/dL — ABNORMAL LOW (ref 8.9–10.3)
Chloride: 101 mmol/L (ref 98–111)
Creatinine, Ser: 0.89 mg/dL (ref 0.61–1.24)
GFR, Estimated: >60 mL/min (ref >60)
Glucose, Bld: 125 mg/dL — ABNORMAL HIGH (ref 70–99)
Potassium: 4.1 mmol/L (ref 3.5–5.1)
Sodium: 135 mmol/L (ref 135–145)
Total Bilirubin: 0.6 mg/dL (ref 0.3–1.2)
Total Protein: 6.9 g/dL (ref 6.5–8.1)

## 2023-01-08 LAB — FERRITIN: Ferritin: 178 ng/mL (ref 24–336)

## 2023-01-08 LAB — LACTATE DEHYDROGENASE: LDH: 210 U/L — ABNORMAL HIGH (ref 98–192)

## 2023-01-08 LAB — FOLATE: Folate: 20.6 ng/mL (ref >5.9)

## 2023-01-08 LAB — VITAMIN B12: Vitamin B-12: 927 pg/mL — ABNORMAL HIGH (ref 180–914)

## 2023-01-08 LAB — IRON AND TIBC
Iron: 116 ug/dL (ref 45–182)
Saturation Ratios: 50 % — ABNORMAL HIGH (ref 17.9–39.5)
TIBC: 233 ug/dL — ABNORMAL LOW (ref 250–450)
UIBC: 117 ug/dL

## 2023-01-12 ENCOUNTER — Telehealth: Payer: Self-pay | Admitting: Cardiology

## 2023-01-12 ENCOUNTER — Ambulatory Visit (INDEPENDENT_AMBULATORY_CARE_PROVIDER_SITE_OTHER): Payer: Medicare PPO | Admitting: *Deleted

## 2023-01-12 DIAGNOSIS — R760 Raised antibody titer: Secondary | ICD-10-CM | POA: Diagnosis not present

## 2023-01-12 DIAGNOSIS — K921 Melena: Secondary | ICD-10-CM | POA: Insufficient documentation

## 2023-01-12 DIAGNOSIS — Z5181 Encounter for therapeutic drug level monitoring: Secondary | ICD-10-CM | POA: Diagnosis not present

## 2023-01-12 DIAGNOSIS — R42 Dizziness and giddiness: Secondary | ICD-10-CM | POA: Insufficient documentation

## 2023-01-12 LAB — POCT INR: INR: 5.1 — AB (ref 2.0–3.0)

## 2023-01-12 NOTE — Telephone Encounter (Signed)
Called and spoke with patient.  Had bowel movement this morning with bright red blood in toilet.  First time he has had this happen.  Has called PCP and has appt for 9:20 this morning.  He is pending colonoscopy 02/06/23.  Recommended he have Dr Margo Aye recheck INR at visit this morning.  Pt will let me know the outcome of the visit.

## 2023-01-12 NOTE — Patient Instructions (Signed)
Hold warfarin tonight and tomorrow night then decrease dose to 1 tablet daily except 1 1/2 tablets on Sundays and Wednesdays Pending Endo/Colonoscopy 9/20.  Will hold warfarin 5 days before procedure and bridge with Lovenox. Recheck in 1 wk

## 2023-01-12 NOTE — Telephone Encounter (Signed)
Wife called to report patient had blood in his bowel movement this morning and wants to know next steps.  Wife wants call back from [employer].

## 2023-01-15 ENCOUNTER — Inpatient Hospital Stay: Payer: Medicare PPO | Admitting: Hematology

## 2023-01-15 DIAGNOSIS — D649 Anemia, unspecified: Secondary | ICD-10-CM | POA: Diagnosis not present

## 2023-01-15 DIAGNOSIS — C8590 Non-Hodgkin lymphoma, unspecified, unspecified site: Secondary | ICD-10-CM | POA: Diagnosis not present

## 2023-01-15 DIAGNOSIS — Z8572 Personal history of non-Hodgkin lymphomas: Secondary | ICD-10-CM | POA: Diagnosis not present

## 2023-01-15 NOTE — Patient Instructions (Signed)
[facility]  Discharge Instructions  You were seen and examined today by Dr. Ellin Saba.  Dr. Ellin Saba discussed your most recent lab work  which revealed that everything looks good and stable except your liver function test are slightly elevated.  Dr. Ellin Saba wants you to stop using tylenol to see if your liver test come down.  Follow-up as scheduled in 1 year.    Thank you for choosing [name] to provide your oncology and hematology care.   To afford each patient quality time with our provider, please arrive at least 15 minutes before your scheduled appointment time. You may need to reschedule your appointment if you arrive late (10 or more minutes). Arriving late affects you and other patients whose appointments are after yours.  Also, if you miss three or more appointments without notifying the office, you may be dismissed from the clinic at the provider's discretion.    Again, thank you for choosing [facility].  Our hope is that these requests will decrease the amount of time that you wait before being seen by our physicians.   If you have a lab appointment with the Cancer Center - please note that after April 8th, all labs will be drawn in the cancer center.  You do not have to check in or register with the main entrance as you have in the past but will complete your check-in at the cancer center.            _____________________________________________________________  Should you have questions after your visit to [facility], please contact our office at [phone] and follow the prompts.  Our office hours are 8:00 a.m. to 4:30 p.m. Monday - Thursday and 8:00 a.m. to 2:30 p.m. Friday.  Please note that voicemails left after 4:00 p.m. may not be returned until the following business day.  We are closed weekends and all major holidays.  You do have access to a nurse 24-7, just call the main  number to the clinic [phone] and do not press any options, hold on the line and a nurse will answer the phone.    For prescription refill requests, have your pharmacy contact our office and allow 72 hours.    Masks are no longer required in the cancer centers. If you would like for your care team to wear a mask while they are taking care of you, please let them know. You may have one support person who is at least 65 years old accompany you for your appointments.

## 2023-01-15 NOTE — Progress Notes (Signed)
[facility] [address], [place] [address]    Clinic Day:  01/15/2023  Referring physician: Benita Stabile, MD  Patient Care Team: Benita Stabile, MD as PCP - General (Internal Medicine) Jonelle Sidle, MD as PCP - Cardiology (Cardiology) Lanier Prude, MD as PCP - Electrophysiology (Cardiology) West Bali, MD (Inactive) as Consulting Physician (Gastroenterology) Dasher, Fayrene Fearing, MD as Consulting Physician (Surgery)   ASSESSMENT & PLAN:   Assessment: 1.  Low-grade follicular lymphoma: - Right inguinal lymph node excision biopsy on 09/15/2005 consistent with grade 2 follicular lymphoma, mixed cell type.  Cells are positive for Bcl-2, CD20 and CD79a.  CD10 is negative. - He was under watchful observation and did not require any treatment over the years. - Last CT CAP on 05/29/2017 at [facility] showed enlarged mediastinal nodes, largest 1.7 x 2.4 cm right infrahilar node.  Prominent but not enlarged abdominal retroperitoneal nodes.  Right middle lobe 7 mm nodule.  Fatty cirrhotic liver.  Normal spleen.   2.  Social/family history: - He works as a Music therapist at [employer].  He quit smoking in 1995.  He smoked 1 pack/day for 20+ years. - Paternal grandmother had pancreatic cancer.  Maternal grandmother died of CVA in her 52s.  Father was on blood thinners.   3.  Normocytic anemia: - Last colonoscopy was in 2008 which showed normal colon with diverticulosis on the left side. - EGD on 03/21/2015 showed nodularity and ulcerations and atrophy seen in prepyloric antrum.  EUS showed thickened gastric wall in the prepyloric antrum with no mass.  4.  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  No history of pulmonary embolism.  Plan: 1.  Low-grade follicular lymphoma: - No B symptoms or infections in the last 6 months. - No lymphadenopathy or palpable splenomegaly. - Labs from 01/08/2023: LDH mildly elevated at 210.  AST and ALT are also  elevated at 63 and 49. - Recent ultrasound of the abdomen on 12/31/2022 showed fatty infiltration of the liver which could be contributing to the elevated liver enzymes and LDH. - Recommend follow-up in 1 year.   2.  Normocytic anemia: -Mild normocytic anemia is stable.   - Continue iron tablet daily.  Ferritin is 178 and percent saturation is 50.   3.  Mild thrombocytopenia: -Mild thrombocytopenia since 2008 has been stable with no bleeding issues.  Likely immune mediated.   4.  Positive lupus anticoagulant with recurrent DVTs/A-fib: -He is taking Coumadin indefinitely.  Recently his INR has been erratic.  He is reportedly taking more Tylenol.  He had 1 episode of rectal bleed.  I have told him to cut back on it.  Orders Placed This Encounter  Procedures   CBC with Differential/Platelet    Standing Status:   Future    Standing Expiration Date:   01/15/2024    Order Specific Question:   Release to patient    Answer:   Immediate   Comprehensive metabolic panel    Standing Status:   Future    Standing Expiration Date:   01/15/2024    Order Specific Question:   Release to patient    Answer:   Immediate   Lactate dehydrogenase    Standing Status:   Future    Standing Expiration Date:   01/15/2024    Order Specific Question:   Release to patient    Answer:   Immediate   Ferritin  Standing Status:   Future    Standing Expiration Date:   01/15/2024    Order Specific Question:   Release to patient    Answer:   Immediate   Iron and TIBC    Standing Status:   Future    Standing Expiration Date:   01/15/2024    Order Specific Question:   Release to patient    Answer:   Immediate      I,Helena R Teague,acting as a scribe for Doreatha Massed, MD.,have documented all relevant documentation on the behalf of Doreatha Massed, MD,as directed by  Doreatha Massed, MD while in the presence of Doreatha Massed, MD.   I, Doreatha Massed MD, have reviewed the above  documentation for accuracy and completeness, and I agree with the above.   Doreatha Massed, MD   8/29/20244:47 PM  CHIEF COMPLAINT:   Diagnosis: Non-Hodgkin's lymphoma, unspecified body region, unspecified non-Hodgkin lymphoma type and normocytic anemia   Cancer Staging  No matching staging information was found for the patient.    Prior Therapy: none  Current Therapy: Observation   HISTORY OF PRESENT ILLNESS:   Oncology History   No history exists.     INTERVAL HISTORY:   Kelly [name] is a 67 y.o. male presenting to clinic today for follow up of Non-Hodgkin's lymphoma and normocytic anemia. He was last seen by me on 01/14/22.  Since his last visit, he underwent lead extraction on 11/14/22 with Dr. Lalla Brothers. Of note, he has a colonoscopy schedule with Dr. Marletta Lor on 02/06/23.   Today, he states that he is doing well overall. His appetite level is at 50%. His energy level is at 25%. He is accompanied by his mother.   His mother notes he had a new pacemaker put in on June 2024, as well as a new  defibrillator. He reports occasional night sweats and an unexpected 22 pound weight loss, from 220 pounds to 198 pounds this morning.   On 01/12/23 he had an episode of hematochezia. He reports a new medication he was prescribed for dizziness. He does not have a lymphedema pump.   He is taking iron tablets OD and multivitamins. He is currently on Coumadin. He takes a Tylenol in the morning and at night prn for left shoulder pain. He used to take it BID when shoulder pain was severe, though it is now mild. He is treating shoulder pain with roll on topical cream.   PAST MEDICAL HISTORY:   Past Medical History: Past Medical History:  Diagnosis Date   Anemia    Anxiety    Aortic stenosis    Arthritis    Atrial fibrillation (HCC)    Atrial fibrillation (HCC)    CHF (congestive heart failure) (HCC)    Chronic pain    Cirrhosis (HCC)    Coronary atherosclerosis of native coronary artery     BMS LAD and PTCA RCA 2000,BMS RCA/PLA 2001, CABG June 2018   Depression    Diverticulitis    Essential hypertension    Fatty liver    Gastric ulcer    GERD (gastroesophageal reflux disease)    Hyperlipidemia    Low-grade NHL (non-Hodgkin's lymphoma) 2007   Lupus anticoagulant positive    MI (myocardial infarction) (HCC) 2001   Pacemaker    Paresthesia of upper limb    Peripheral neuropathy    Peripheral vascular disease (HCC)    blood clots in legs   Thrombocytopenia (HCC)    Trifascicular block    Type 2 diabetes mellitus (  HCC)    Umbilical hernia     Surgical History: Past Surgical History:  Procedure Laterality Date   Axillary abscess     left incision and drainage left   BACK SURGERY     multiple   BIOPSY  08/15/2014   Procedure: BIOPSY;  Surgeon: West Bali, MD;  Location: [facility];  Service: Endoscopy;;  Gastric   BIOPSY  01/19/2015   Procedure: BIOPSY (GASTRIC ULCER);  Surgeon: West Bali, MD;  Location: [facility];  Service: Endoscopy;;   CARDIAC CATHETERIZATION     cardiac stents     x5 stents   COLONOSCOPY  June 2008   Dr. Tinnie Gens Medoff: Mild left colonic diverticulosis   CORONARY ARTERY BYPASS GRAFT N/A 11/11/2016   Procedure: CORONARY ARTERY BYPASS GRAFTING (CABG) x 3, USING LEFT MAMMARY ARTERY AND RIGHT GREATER SAPHENOUS VEIN HARVESTED ENDOSCOPICALLY;  Surgeon: Kerin Perna, MD;  Location: [facility];  Service: Open Heart Surgery;  Laterality: N/A;   CORONARY PRESSURE/FFR STUDY N/A 09/26/2016   Procedure: Intravascular Pressure Wire/FFR Study;  Surgeon: Yvonne Kendall, MD;  Location: [facility] INVASIVE CV LAB;  Service: Cardiovascular;  Laterality: N/A;   Dental extractions     ESOPHAGOGASTRODUODENOSCOPY  June 2008   Dr. Tinnie Gens Medoff: Gastric ulcer, antral, biopsy with reactive changes associated glandular atrophy, no H pylori. No features of lymphoma. Likely NSAID related   ESOPHAGOGASTRODUODENOSCOPY  August 2008   Dr. Tinnie Gens Medoff: Complete healing of  gastric ulcers.   ESOPHAGOGASTRODUODENOSCOPY (EGD) WITH PROPOFOL N/A 08/15/2014   SLF: 1. Abnormal pain due to ulcers & gastriitis    ESOPHAGOGASTRODUODENOSCOPY (EGD) WITH PROPOFOL N/A 01/19/2015   SLF: 1. Persistant ulcer with firm base 2. single gastric polyp found in the gastric antrum. Ulcer base    EUS N/A 03/21/2015   Thickened gastric wall in pre-pyloric antrum, no obvious intramural mass, fatty pancreas, no obvious pancreatic mass.    Inguinal lymph node biopsy     right inguinal lymph node biopsy   LEAD EXTRACTION N/A 11/14/2022   Procedure: LEAD EXTRACTION;  Surgeon: Lanier Prude, MD;  Location: [facility] INVASIVE CV LAB;  Service: Cardiovascular;  Laterality: N/A;   LEFT HEART CATH AND CORONARY ANGIOGRAPHY N/A 09/26/2016   Procedure: Left Heart Cath and Coronary Angiography;  Surgeon: Yvonne Kendall, MD;  Location: [facility] CV LAB;  Service: Cardiovascular;  Laterality: N/A;   LEFT HEART CATH AND CORS/GRAFTS ANGIOGRAPHY N/A 06/29/2020   Procedure: LEFT HEART CATH AND CORS/GRAFTS ANGIOGRAPHY;  Surgeon: Tonny Bollman, MD;  Location: [facility] INVASIVE CV LAB;  Service: Cardiovascular;  Laterality: N/A;   NECK SURGERY     PACEMAKER IMPLANT N/A 03/12/2020   Procedure: PACEMAKER IMPLANT;  Surgeon: Lanier Prude, MD;  Location: [facility] INV[facility] CV LAB;  Service: Cardiovascular;  Laterality: N/A;   POLYPECTOMY  01/19/2015   Procedure: POLYPECTOMY (GASTRIC);  Surgeon: West Bali, MD;  Location: [facility];  Service: Endoscopy;;   ROUX-EN-Y GASTRIC BYPASS     RNY gastric bypass   SHOULDER ARTHROSCOPY Left    TEE WITHOUT CARDIOVERSION N/A 11/11/2016   Procedure: TRANSESOPHAGEAL ECHOCARDIOGRAM (TEE);  Surgeon: Donata Clay, Theron Arista, MD;  Location: [facility] OR;  Service: Open Heart Surgery;  Laterality: N/A;    Social History: Social History   Socioeconomic History   Marital status: Married    Spouse name: Not on file   Number of children: Not on file   Years of education: Not on file   Highest education  level: Not on file  Occupational History  Not on file  Tobacco Use   Smoking status: Former    Current packs/day: 0.00    Average packs/day: 1 pack/day for 23.0 years (23.0 ttl pk-yrs)    Types: Cigarettes    Start date: 10/30/1970    Quit date: 10/29/1993    Years since quitting: 29.2   Smokeless tobacco: Former    Types: Snuff, Chew  Vaping Use   Vaping status: Never Used  Substance and Sexual Activity   Alcohol use: No    Alcohol/week: 0.0 standard drinks of alcohol   Drug use: No   Sexual activity: Never    Birth control/protection: None  Other Topics Concern   Not on file  Social History Narrative   Not on file   Social Determinants of Health   Financial Resource Strain: High Risk (10/17/2020)   Overall Financial Resource Strain (CARDIA)    Difficulty of Paying Living Expenses: Hard  Food Insecurity: No Food Insecurity (11/14/2022)   Hunger Vital Sign    Worried About Running Out of Food in the Last Year: Never true    Ran Out of Food in the Last Year: Never true  Transportation Needs: No Transportation Needs (11/14/2022)   PRAPARE - Administrator, Civil Service (Medical): No    Lack of Transportation (Non-Medical): No  Physical Activity: Sufficiently Active (10/17/2020)   Exercise Vital Sign    Days of Exercise per Week: 7 days    Minutes of Exercise per Session: 60 min  Stress: Stress Concern Present (10/17/2020)   [employer] of Occupational Health - Occupational Stress Questionnaire    Feeling of Stress : Very much  Social Connections: Unknown (09/30/2021)   Received from [facility]   Social Network    Social Network: Not on file  Intimate Partner Violence: Not At Risk (11/14/2022)   Humiliation, Afraid, Rape, and Kick questionnaire    Fear of Current or Ex-Partner: No    Emotionally Abused: No    Physically Abused: No    Sexually Abused: No    Family History: Family History  Problem Relation Age of Onset   Pancreatic  cancer Maternal Grandmother    Obesity Mother    Hypertension Mother    Diabetes Father    Thyroid disease Father    Heart disease Father    Colon cancer Neg Hx     Current Medications:  Current Outpatient Medications:    acetaminophen (TYLENOL) 500 MG tablet, Take 500-1,000 mg by mouth every 6 (six) hours as needed (for pain.)., Disp: , Rfl:    Ascorbic Acid (VITAMIN C) 500 MG CAPS, Take 500 mg by mouth daily., Disp: , Rfl:    aspirin EC 81 MG tablet, Take 1 tablet (81 mg total) by mouth daily., Disp: 30 tablet, Rfl: 11   B Complex-C (B-COMPLEX WITH VITAMIN C) tablet, Take 1 tablet by mouth daily., Disp: , Rfl:    cetirizine (ZYRTEC) 10 MG tablet, Take 10 mg by mouth daily as needed for allergies., Disp: , Rfl:    Cholecalciferol (VITAMIN D3) 50 MCG (2000 UT) TABS, Take 2,000 Units by mouth daily., Disp: , Rfl:    ferrous sulfate 325 (65 FE) MG tablet, Take 325 mg by mouth 2 (two) times daily., Disp: , Rfl:    FLUoxetine (PROZAC) 20 MG capsule, Take 20 mg by mouth daily., Disp: , Rfl:    gabapentin (NEURONTIN) 600 MG tablet, Take 600 mg by mouth at bedtime., Disp: , Rfl:    glucose blood (  ONETOUCH VERIO) test strip, USE AS DIRECTED FOUR- FIVE TIMES DAILY, Disp: 450 each, Rfl: 1   Insulin Glargine (BASAGLAR KWIKPEN) 100 UNIT/ML SOPN, INJECT 80 UNITS INTO SKIN DAILY AT 10 PM (Patient taking differently: Inject 50-80 Units into the skin at bedtime as needed (high blood sugar).), Disp: 30 mL, Rfl: 2   IRON, FERROUS SULFATE, PO, Take 65 mg by mouth in the morning and at bedtime., Disp: , Rfl:    isosorbide mononitrate (IMDUR) 30 MG 24 hr tablet, Take 1 tablet (30 mg total) by mouth daily., Disp: , Rfl:    lactulose (CHRONULAC) 10 GM/15ML solution, Take 15 mLs (10 g total) by mouth daily., Disp: 946 mL, Rfl: 0   Magnesium 250 MG TABS, Take 250 mg by mouth See admin instructions. Take 250 mg in the morning, may take another 250 mg dose up to 3 more times a day as needed for muscle cramps, Disp: ,  Rfl:    Menthol-Methyl Salicylate (MUSCLE RUB) 10-15 % CREA, Apply 1 Application topically as needed for muscle pain., Disp: , Rfl:    Multiple Vitamin (MULTIVITAMIN WITH MINERALS) TABS tablet, Take 1 tablet by mouth in the morning and at bedtime., Disp: , Rfl:    nitroGLYCERIN (NITROSTAT) 0.4 MG SL tablet, Place 1 tablet (0.4 mg total) under the tongue every 5 (five) minutes x 3 doses as needed for chest pain., Disp: 25 tablet, Rfl: 3   ONETOUCH VERIO test strip, USE TO TEST FOUR TIMES DAILY AS DIRECTED, Disp: 450 each, Rfl: 2   Oxycodone HCl 10 MG TABS, Take 10 mg by mouth 2 (two) times daily as needed (pain)., Disp: , Rfl:    pantoprazole (PROTONIX) 40 MG tablet, TAKE 1 TABLET BY MOUTH DAILY BEFORE BREAKFAST, Disp: 90 tablet, Rfl: 0   Pediatric Multivitamins-Iron (FLINTSTONES COMPLETE PO), Take 1 tablet by mouth daily., Disp: , Rfl:    sacubitril-valsartan (ENTRESTO) 24-26 MG, Take 1 tablet by mouth 2 (two) times daily., Disp: 60 tablet, Rfl: 11   simvastatin (ZOCOR) 10 MG tablet, TAKE 1 TABLET BY MOUTH EVERY NIGHT AT BEDTIME, Disp: 30 tablet, Rfl: 2   Sodium Bicarbonate-Citric Acid (ALKA-SELTZER HEARTBURN) 1940-1000 MG TBEF, Take 1-2 tablets by mouth 2 (two) times daily as needed (heartburn/indigestion)., Disp: , Rfl:    warfarin (COUMADIN) 5 MG tablet, TAKE 1 AND 1/2 TABLETS BY MOUTH DAILY EXCEPT 1 TABLET ON TUESDAYS, THURSDAY AND SATURDAYS OR AS DIRECTED, Disp: 45 tablet, Rfl: 5   Allergies: No Known Allergies  REVIEW OF SYSTEMS:   Review of Systems  Constitutional:  Negative for chills, fatigue and fever.  HENT:   Negative for lump/mass, mouth sores, nosebleeds, sore throat and trouble swallowing.   Eyes:  Negative for eye problems.  Respiratory:  Negative for cough and shortness of breath.   Cardiovascular:  Negative for chest pain, leg swelling and palpitations.  Gastrointestinal:  Negative for abdominal pain, constipation, diarrhea, nausea and vomiting.  Genitourinary:  Negative  for bladder incontinence, difficulty urinating, dysuria, frequency, hematuria and nocturia.   Musculoskeletal:  Negative for arthralgias, back pain, flank pain, myalgias and neck pain.  Skin:  Negative for itching and rash.  Neurological:  Negative for dizziness, headaches and numbness.  Hematological:  Does not bruise/bleed easily.  Psychiatric/Behavioral:  Negative for depression, sleep disturbance and suicidal ideas. The patient is not nervous/anxious.   All other systems reviewed and are negative.    VITALS:   There were no vitals taken for this visit.  Wt Readings from Last 3  Encounters:  12/25/22 213 lb (96.6 kg)  12/24/22 210 lb (95.3 kg)  12/22/22 214 lb 6.4 oz (97.3 kg)    There is no height or weight on file to calculate BMI.  Performance status (ECOG): 1 - Symptomatic but completely ambulatory  PHYSICAL EXAM:   Physical Exam Vitals and nursing note reviewed. Exam conducted with a chaperone present.  Constitutional:      Appearance: Normal appearance.  Cardiovascular:     Rate and Rhythm: Normal rate and regular rhythm.     Pulses: Normal pulses.     Heart sounds: Normal heart sounds.  Pulmonary:     Effort: Pulmonary effort is normal.     Breath sounds: Normal breath sounds.  Abdominal:     Palpations: Abdomen is soft. There is no hepatomegaly, splenomegaly or mass.     Tenderness: There is no abdominal tenderness.  Musculoskeletal:     Right lower leg: Edema present.     Left lower leg: No edema.  Lymphadenopathy:     Cervical: No cervical adenopathy.     Right cervical: No superficial, deep or posterior cervical adenopathy.    Left cervical: No superficial, deep or posterior cervical adenopathy.     Upper Body:     Right upper body: No supraclavicular or axillary adenopathy.     Left upper body: No supraclavicular or axillary adenopathy.  Neurological:     General: No focal deficit present.     Mental Status: He is alert and oriented to person, place, and  time.  Psychiatric:        Mood and Affect: Mood normal.        Behavior: Behavior normal.     LABS:      Latest Ref Rng & Units 01/08/2023    1:56 PM 11/07/2022    8:56 AM 01/07/2022    3:45 PM  CBC  WBC 4.0 - 10.5 K/uL 5.5  5.6  4.6   Hemoglobin 13.0 - 17.0 g/dL 40.9  81.1  91.4   Hematocrit 39.0 - 52.0 % 35.8  37.3  33.3   Platelets 150 - 400 K/uL 107  121  105       Latest Ref Rng & Units 01/08/2023    1:56 PM 11/07/2022    8:56 AM 07/03/2021    3:35 PM  CMP  Glucose 70 - 99 mg/dL 782  956  213   BUN 8 - 23 mg/dL 19  13  19    Creatinine 0.61 - 1.24 mg/dL 0.86  5.78  4.69   Sodium 135 - 145 mmol/L 135  142  136   Potassium 3.5 - 5.1 mmol/L 4.1  4.3  4.3   Chloride 98 - 111 mmol/L 101  102  103   CO2 22 - 32 mmol/L 27  28  24    Calcium 8.9 - 10.3 mg/dL 8.5  8.9  8.7   Total Protein 6.5 - 8.1 g/dL 6.9   7.0   Total Bilirubin 0.3 - 1.2 mg/dL 0.6   0.4   Alkaline Phos 38 - 126 U/L 76   93   AST 15 - 41 U/L 63   25   ALT 0 - 44 U/L 49   21      No results found for: "CEA1", "CEA" / No results found for: "CEA1", "CEA" No results found for: "PSA1" No results found for: "GEX528" No results found for: "CAN125"  Lab Results  Component Value Date   TOTALPROTELP 6.8 10/17/2020  ALBUMINELP 3.8 10/17/2020   A1GS 0.2 10/17/2020   A2GS 0.7 10/17/2020   BETS 0.8 10/17/2020   GAMS 1.4 10/17/2020   MSPIKE Not Observed 10/17/2020   SPEI Comment 10/17/2020   Lab Results  Component Value Date   TIBC 233 (L) 01/08/2023   TIBC 250 01/07/2022   TIBC 256 07/03/2021   FERRITIN 178 01/08/2023   FERRITIN 169 01/07/2022   FERRITIN 160 07/03/2021   IRONPCTSAT 50 (H) 01/08/2023   IRONPCTSAT 46 (H) 01/07/2022   IRONPCTSAT 35 07/03/2021   Lab Results  Component Value Date   LDH 210 (H) 01/08/2023   LDH 169 01/07/2022   LDH 176 07/03/2021     STUDIES:   US Abdomen Complete  Result Date: 12/31/2022 CLINICAL DATA:  Cirrhosis of liver. EXAM: ABDOMEN ULTRASOUND COMPLETE  COMPARISON:  Oct 09, 2016 FINDINGS: Gallbladder: Gallstones and sludge are noted in the gallbladder, largest measures 1.7 cm. Gallbladder wall measures 2 mm. No sonographic Murphy sign noted by sonographer. Common bile duct: Diameter: 6.8 mm. Liver: Increased echotexture. No focal liver lesion. Portal vein is patent on color Doppler imaging with normal direction of blood flow towards the liver. IVC: No abnormality visualized. Pancreas: Ultrasound technologist reports poor visualization. Spleen: Size and appearance within normal limits. Right Kidney: Length: 10.4 cm. Echogenicity within normal limits. No mass or hydronephrosis visualized. Left Kidney: Length: 9.1 cm. Possible 3.4 mm stone. Echogenicity within normal limits. No mass or hydronephrosis visualized. Abdominal aorta: No aneurysm visualized. Other findings: None. IMPRESSION: 1. Cholelithiasis without sonographic evidence of acute cholecystitis. 2. Fatty infiltration of the liver. 3. Possible left kidney stone. Electronically Signed   By: Sherian Rein M.D.   On: 12/31/2022 09:10

## 2023-01-21 ENCOUNTER — Ambulatory Visit: Payer: Medicare PPO | Attending: Cardiology | Admitting: *Deleted

## 2023-01-21 DIAGNOSIS — I480 Paroxysmal atrial fibrillation: Secondary | ICD-10-CM | POA: Diagnosis not present

## 2023-01-21 DIAGNOSIS — Z5181 Encounter for therapeutic drug level monitoring: Secondary | ICD-10-CM | POA: Diagnosis not present

## 2023-01-21 DIAGNOSIS — D6869 Other thrombophilia: Secondary | ICD-10-CM | POA: Diagnosis not present

## 2023-01-21 LAB — POCT INR: INR: 2.2 (ref 2.0–3.0)

## 2023-01-21 MED ORDER — ENOXAPARIN SODIUM 150 MG/ML IJ SOSY: 150.0000 mg | PREFILLED_SYRINGE | INTRAMUSCULAR | 0 refills | Status: DC

## 2023-01-21 NOTE — Patient Instructions (Addendum)
02/06/23  Colonoscopy/Endoscopy  Labs:  01/08/23:  Hgb 12.3  Hct 35.8  plts 107  Scr 0.89  CrCl 113.06  Wt 96.6kg  9/14  Last dose of warfarin 5mg  9/15  No lovenox or warfarin 9/16 - 9/19  Lovenox 150mg  sq at 8am 9/20  No Lovenox -----procedure------warfarin 7.5mg  pm 9/21 - 9/22  Lovenox 150mg  sq at 8 am and warfarin 7.5mg  pm 9/23 - 9/24  Lovenox 150mg  sq at 8 am and warfarin 5mg  pm 9/25  Lovenox 150mg  sq at 8 am and warfarin 7.5mg  pm 9/26  Lovenox 150mg  sq at 8 am and INR appt at 8:00am

## 2023-02-02 NOTE — Progress Notes (Unsigned)
Cardiology Office Note    Date:  02/03/2023  ID:  Kelly [name], DOB 06/07/1955, MRN [id number] PCP:  Benita Stabile, MD  Cardiologist:  Nona Dell, MD  Electrophysiologist:  Lanier Prude, MD   Chief Complaint: f/u CHF med titration  History of Present Illness: .    Kelly [name] is a 67 y.o. male with visit-pertinent history of CAD s/p BMS-LAD, angioplasty-RCA in 200, BMS to RCA/PLA 2001, CABG 2018 (LIMA-LAD, SVG-OM2, SVG-PDA), chronic HFrEF, moderate-severe AS, prior CHB s/p PPM with upgrade to [facility]. Jude CRT-D 10/2022 managed by Dr. Lalla Brothers, PAF, HTN, HLD, recent anemia, thrombocytopenia, arthritis, chronic pain, cirrhosis, depression, fatty liver, gastric ulcer, GERD, NHL, lupus anticoagulant positive, prior recurrent DVTs, DM2 seen for follow-up of HF. Last cath appears to have been in 06/2020 with patent bypass grafts, distal vessel disease, not amenable to PCI, medical therapy recommended. EF was normal in 2021, 50% in 2023, then recent study 10/2022 EF dropped to 30-35%, global HK, mild LV dilation, mild LVH, mildly reduced RV systolic function, mild RV enlargement, mild MR, moderate-severe low flow/low gradient AS. He had been seen in clinic in 10/2022 having lightheadedness and dizziness, found to have intermittent ventricular capture with ventricular escape in the 30s, requiring device adjustment. On 11/14/22 he underwent removal of RV pace/sense lead and upgrade of system to CRT-D. Dr. Diona Browner saw him back in follow-up 12/25/22 and recommended adjustment to GDMT as tolerated. It was felt that his orthostatic intolerance may limit this. Amlodipine was stopped, Imdur was reduced, and he was started on Entresto. Dr. Diona Browner also discussed the possibility of needing TAVR in the future. Dr. Diona Browner also noted plans for upcoming EGD and colonoscopy and recommended he proceed with bridging with Lovenox off coumadin for upcoming EGD/colonoscopy. The patient has h/o intermittent black stools  per GI note and anemia. He also had episode of bright red blood in stool a few weeks ago. At last device check 11/2022 his AT/AF burden was <1%.  He is seen for follow-up largely stable with the med changes. He has only had to take SL NTG once in the last few months, about 1 month ago, which is an improvement from his previous baseline over the last few years. He has also shifted away from his previously incredibly stressful job which has helped his wellbeing overall. He still gets occasional dizziness with position changes but this has improved as well. He reports chronic NYHA class II dyspnea with higher levels of activity without recent acceleration.   Labwork independently reviewed: 12/2022 Hgb 12.3, plt 107, K 4.1, Cr 0.89, AST/ALT up 2021 TSH OK 2018 LDL 44, trig 164  ROS: .    Please see the history of present illness.  All other systems are reviewed and otherwise negative.  Studies Reviewed: Marland Kitchen    EKG:  EKG is not ordered today but reviewed recent tracing 12/22/22, AV paced 62bpm  CV Studies: Cardiac studies reviewed are outlined and summarized above. Otherwise please see EMR for full report.   Current Reported Medications:.    Current Meds  Medication Sig   acetaminophen (TYLENOL) 500 MG tablet Take 500-1,000 mg by mouth every 6 (six) hours as needed (for pain.).   Ascorbic Acid (VITAMIN C) 500 MG CAPS Take 500 mg by mouth daily.   aspirin EC 81 MG tablet Take 1 tablet (81 mg total) by mouth daily.   B Complex-C (B-COMPLEX WITH VITAMIN C) tablet Take 1 tablet by mouth daily.   cetirizine (ZYRTEC)  10 MG tablet Take 10 mg by mouth daily as needed for allergies.   Cholecalciferol (VITAMIN D3) 50 MCG (2000 UT) TABS Take 2,000 Units by mouth daily.   enoxaparin (LOVENOX) 150 MG/ML injection Inject 1 mL (150 mg total) into the skin daily.   ferrous sulfate 325 (65 FE) MG tablet Take 325 mg by mouth 2 (two) times daily.   FLUoxetine (PROZAC) 20 MG capsule Take 20 mg by mouth daily.    gabapentin (NEURONTIN) 600 MG tablet Take 600 mg by mouth at bedtime.   glucose blood (ONETOUCH VERIO) test strip USE AS DIRECTED FOUR- FIVE TIMES DAILY   Insulin Glargine (BASAGLAR KWIKPEN) 100 UNIT/ML SOPN INJECT 80 UNITS INTO SKIN DAILY AT 10 PM (Patient taking differently: Inject 50-80 Units into the skin at bedtime as needed (high blood sugar).)   IRON, FERROUS SULFATE, PO Take 65 mg by mouth in the morning and at bedtime.   isosorbide mononitrate (IMDUR) 30 MG 24 hr tablet Take 1 tablet (30 mg total) by mouth daily.   lactulose (CHRONULAC) 10 GM/15ML solution Take 15 mLs (10 g total) by mouth daily.   Magnesium 250 MG TABS Take 250 mg by mouth See admin instructions. Take 250 mg in the morning, may take another 250 mg dose up to 3 more times a day as needed for muscle cramps   Menthol-Methyl Salicylate (MUSCLE RUB) 10-15 % CREA Apply 1 Application topically as needed for muscle pain.   Multiple Vitamin (MULTIVITAMIN WITH MINERALS) TABS tablet Take 1 tablet by mouth in the morning and at bedtime.   nitroGLYCERIN (NITROSTAT) 0.4 MG SL tablet Place 1 tablet (0.4 mg total) under the tongue every 5 (five) minutes x 3 doses as needed for chest pain.   ONETOUCH VERIO test strip USE TO TEST FOUR TIMES DAILY AS DIRECTED   Oxycodone HCl 10 MG TABS Take 10 mg by mouth 2 (two) times daily as needed (pain).   pantoprazole (PROTONIX) 40 MG tablet TAKE 1 TABLET BY MOUTH DAILY BEFORE BREAKFAST   Pediatric Multivitamins-Iron (FLINTSTONES COMPLETE PO) Take 1 tablet by mouth daily.   sacubitril-valsartan (ENTRESTO) 24-26 MG Take 1 tablet by mouth 2 (two) times daily.   simvastatin (ZOCOR) 10 MG tablet TAKE 1 TABLET BY MOUTH EVERY NIGHT AT BEDTIME   Sodium Bicarbonate-Citric Acid (ALKA-SELTZER HEARTBURN) 1940-1000 MG TBEF Take 1-2 tablets by mouth 2 (two) times daily as needed (heartburn/indigestion).   warfarin (COUMADIN) 5 MG tablet TAKE 1 AND 1/2 TABLETS BY MOUTH DAILY EXCEPT 1 TABLET ON TUESDAYS, THURSDAY AND  SATURDAYS OR AS DIRECTED    Physical Exam:    VS:  BP 124/60   Pulse 60   Ht 6\' 3"  (1.905 m)   Wt 219 lb (99.3 kg)   SpO2 97%   BMI 27.37 kg/m    Wt Readings from Last 3 Encounters:  02/03/23 219 lb (99.3 kg)  12/25/22 213 lb (96.6 kg)  12/24/22 210 lb (95.3 kg)    GEN: Well nourished, well developed in no acute distress NECK: No JVD; No carotid bruits CARDIAC: RRR, 2/6 SEM without rubs or gallops RESPIRATORY:  Clear to auscultation without rales, wheezing or rhonchi  ABDOMEN: Soft, non-tender, non-distended EXTREMITIES:  No edema; No acute deformity   Asessement and Plan:.    1. Chronic HFrEF, HTN with orthostatic intolerance history - weight uptrended by trends but appears euvolemic on exam today. The etiology of his LV dysfunction is unclear. He had issues with his pacemaker system identified in June,do not see  commentary that EP felt this was contributing, but he's since been upgraded to CRT device. Do not see plans from Dr. Ival Bible note to pursue updated ischemic eval, preference was to start with titration of GDMT. He also has known moderate-severe AS. He seems to be doing better on current GDMT regimen of Imdur 30mg  daily, Entresto 24/26mg  BID. He still has rare dizziness but this has improved. As he is pending GI procedure in the next few days I do not think we should add another medication acutely until this is complete. I would recommend getting f/u CMET today and repeating limited echo now that we are 3 months out from his CRT-upgrade. I will review plan with Dr. Diona Browner but anticipate that if EF is still down, may need to consider [facility] as long as GI studies do not show anything that would prohibit use of additional antiplatelet therapy if needed. Addendum: plan reviewed with Dr. Diona Browner. Will carry out plan as outlined. 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  However, anticipate he would need future cath as part of TAVR evaluation if/when symptoms progress. We will f/u limited echo as planned and go from there.  2. Moderate-severe AS - f/u repeat limited echo planned above. Question whether this plays a role in his dizziness as well. Will review plan with Dr. Diona Browner.  3. CAD with HLD - no progressive angina. Frequency of SL NTG actually reduced compared to prior years. Remains on baby aspirin at the guidance of primary cardiologist. He is also treated with simvastatin 10mg , lipids not followed in our practice. He denies prior intolerance to other statins. Higher intensity statin is indicated by guidelines and history. Will recheck CMET today given recent abnormal LFTs. If stable/improved, will consider switch to rosuvastatin with f/u labs in 2-3 months.  4. PAF, h/o PPM, also with recurrent DVT hx - EP is following his AF burden by device now. On Lovenox bridging from Coumadin at the guidance of Dr. Diona Browner for upcoming GI procedure on 02/06/23. Get f/u CBC as well to ensure Hgb/plt stable.    Disposition: F/u with Dr. Diona Browner or APP in 6 weeks.  Signed, Laurann Montana, PA-C

## 2023-02-03 ENCOUNTER — Other Ambulatory Visit (HOSPITAL_COMMUNITY)
Admission: RE | Admit: 2023-02-03 | Discharge: 2023-02-03 | Disposition: A | Discharge status: Home / Self Care | Payer: Medicare PPO | Source: Ambulatory Visit | Attending: Physician Assistant | Admitting: Physician Assistant

## 2023-02-03 ENCOUNTER — Encounter (HOSPITAL_COMMUNITY)
Admission: RE | Admit: 2023-02-03 | Discharge: 2023-02-03 | Disposition: A | Discharge status: Home / Self Care | Payer: Medicare PPO | Source: Ambulatory Visit | Attending: Internal Medicine | Admitting: Internal Medicine

## 2023-02-03 ENCOUNTER — Encounter: Payer: Self-pay | Admitting: Gastroenterology

## 2023-02-03 ENCOUNTER — Ambulatory Visit: Payer: Medicare PPO | Attending: Physician Assistant | Admitting: Physician Assistant

## 2023-02-03 ENCOUNTER — Other Ambulatory Visit: Payer: Self-pay

## 2023-02-03 ENCOUNTER — Encounter (HOSPITAL_COMMUNITY): Payer: Self-pay

## 2023-02-03 ENCOUNTER — Encounter: Payer: Self-pay | Admitting: Physician Assistant

## 2023-02-03 VITALS — BP 124/60 | HR 60 | Ht 75.0 in | Wt 219.0 lb

## 2023-02-03 DIAGNOSIS — I2510 Atherosclerotic heart disease of native coronary artery without angina pectoris: Secondary | ICD-10-CM | POA: Insufficient documentation

## 2023-02-03 DIAGNOSIS — Z01812 Encounter for preprocedural laboratory examination: Secondary | ICD-10-CM | POA: Insufficient documentation

## 2023-02-03 DIAGNOSIS — I480 Paroxysmal atrial fibrillation: Secondary | ICD-10-CM | POA: Insufficient documentation

## 2023-02-03 DIAGNOSIS — E785 Hyperlipidemia, unspecified: Secondary | ICD-10-CM | POA: Diagnosis not present

## 2023-02-03 DIAGNOSIS — I5022 Chronic systolic (congestive) heart failure: Secondary | ICD-10-CM

## 2023-02-03 DIAGNOSIS — I350 Nonrheumatic aortic (valve) stenosis: Secondary | ICD-10-CM | POA: Diagnosis not present

## 2023-02-03 HISTORY — DX: Presence of automatic (implantable) cardiac defibrillator: Z95.810

## 2023-02-03 HISTORY — DX: Sleep apnea, unspecified: G47.30

## 2023-02-03 LAB — COMPREHENSIVE METABOLIC PANEL
ALT: 35 U/L (ref 0–44)
AST: 42 U/L — ABNORMAL HIGH (ref 15–41)
Albumin: 3.6 g/dL (ref 3.5–5.0)
Alkaline Phosphatase: 76 U/L (ref 38–126)
Anion gap: 8 (ref 5–15)
BUN: 14 mg/dL (ref 8–23)
CO2: 27 mmol/L (ref 22–32)
Calcium: 8.7 mg/dL — ABNORMAL LOW (ref 8.9–10.3)
Chloride: 103 mmol/L (ref 98–111)
Creatinine, Ser: 0.86 mg/dL (ref 0.61–1.24)
GFR, Estimated: >60 mL/min (ref >60)
Glucose, Bld: 126 mg/dL — ABNORMAL HIGH (ref 70–99)
Potassium: 4.5 mmol/L (ref 3.5–5.1)
Sodium: 138 mmol/L (ref 135–145)
Total Bilirubin: 0.8 mg/dL (ref 0.3–1.2)
Total Protein: 6.9 g/dL (ref 6.5–8.1)

## 2023-02-03 LAB — CBC
HCT: 37.4 % — ABNORMAL LOW (ref 39.0–52.0)
Hemoglobin: 12.7 g/dL — ABNORMAL LOW (ref 13.0–17.0)
MCH: 32.3 pg (ref 26.0–34.0)
MCHC: 34 g/dL (ref 30.0–36.0)
MCV: 95.2 fL (ref 80.0–100.0)
Platelets: 124 10*3/uL — ABNORMAL LOW (ref 150–400)
RBC: 3.93 MIL/uL — ABNORMAL LOW (ref 4.22–5.81)
RDW: 11.9 % (ref 11.5–15.5)
WBC: 4.8 10*3/uL (ref 4.0–10.5)
nRBC: 0 % (ref 0.0–0.2)

## 2023-02-03 NOTE — Patient Instructions (Signed)
Medication Instructions:  Your physician recommends that you continue on your current medications as directed. Please refer to the Current Medication list given to you today.   Labwork: Cbc,cmet today  Testing/Procedures: Your physician has requested that you have a LIMITED echocardiogram after 02/14/23. Echocardiography is a painless test that uses sound waves to create images of your heart. It provides your doctor with information about the size and shape of your heart and how well your heart's chambers and valves are working. This procedure takes approximately one hour. There are no restrictions for this procedure. Please do NOT wear cologne, perfume, aftershave, or lotions (deodorant is allowed). Please arrive 15 minutes prior to your appointment time.   Follow-Up: 6 weeks  Any Other Special Instructions Will Be Listed Below (If Applicable).  If you need a refill on your cardiac medications before your next appointment, please call your pharmacy.

## 2023-02-03 NOTE — Patient Instructions (Signed)
Kelly [name]  02/03/2023     @PREFPERIOPPHARMACY @   Your procedure is scheduled on 02/06/23.  Report to [name] at [phone] A.M.  Call this number if you have problems the morning of surgery:  [phone]  If you experience any cold or flu symptoms such as cough, fever, chills, shortness of breath, etc. between now and your scheduled surgery, please notify us at the above number.   Remember:  Do not eat or drink after midnight.     Take these medicines the morning of surgery with A SIP OF WATER zyrtec, prozac, isosorbide, oxycodone, protonix & entresto.  TAKE HALF OF BEDTIME INSULIN DOSE NIGHT BEFORE PROCEDURE. DO NOT TAKE ANY DIABETIC MEDICATIONS MORNING OF PROCEDURE.  LAST DOSE OF COUMADIN TO BE 01/31/23.  FOLLOW [facility] PER YOUR CARDIOLOGIST INSTRUCTIONS.    Do not wear jewelry, make-up or nail polish, including gel polish,  artificial nails, or any other type of covering on natural nails (fingers and  toes).  Do not wear lotions, powders, or perfumes, or deodorant.  Do not shave 48 hours prior to surgery.  Men may shave face and neck.  Do not bring valuables to the hospital.  [facility] is not responsible for any belongings or valuables.  Contacts, dentures or bridgework may not be worn into surgery.  Leave your suitcase in the car.  After surgery it may be brought to your room.  For patients admitted to the hospital, discharge time will be determined by your treatment team.  Patients discharged the day of surgery will not be allowed to drive home.   Name and phone number of your driver:   FAMILY Special instructions:  FOLLOW DIET & PREP INSTRUCTIONS PROVIDED BY [facility]CTORS OFFICE.  Please read over the following fact sheets that you were given. Anesthesia Post-op Instructions and Care and Recovery After Surgery      PATIENT INSTRUCTIONS POST-ANESTHESIA  IMMEDIATELY FOLLOWING SURGERY:  Do not drive or operate machinery for the first twenty four hours after  surgery.  Do not make any important decisions for twenty four hours after surgery or while taking narcotic pain medications or sedatives.  If you develop intractable nausea and vomiting or a severe headache please notify your doctor immediately.  FOLLOW-UP:  Please make an appointment with your surgeon as instructed. You do not need to follow up with anesthesia unless specifically instructed to do so.  WOUND CARE INSTRUCTIONS (if applicable):  Keep a dry clean dressing on the anesthesia/puncture wound site if there is drainage.  Once the wound has quit draining you may leave it open to air.  Generally you should leave the bandage intact for twenty four hours unless there is drainage.  If the epidural site drains for more than 36-48 hours please call the anesthesia department.  QUESTIONS?:  Please feel free to call your physician or the hospital operator if you have any questions, and they will be happy to assist you.      Colonoscopy, Adult A colonoscopy is a procedure to look at the entire large intestine. This procedure is done using a long, thin, flexible tube that has a camera on the end. You may have a colonoscopy: As a part of normal colorectal screening. If you have certain symptoms, such as: A low number of red blood cells in your blood (anemia). Diarrhea that does not go away. Pain in your abdomen. Blood in your stool. A colonoscopy can help screen for and diagnose medical problems, including: An abnormal  growth of cells or tissue (tumor). Abnormal growths within the lining of your intestine (polyps). Inflammation. Areas of bleeding. Tell your health care provider about: Any allergies you have. All medicines you are taking, including vitamins, herbs, eye drops, creams, and over-the-counter medicines. Any problems you or family members have had with anesthetic medicines. Any bleeding problems you have. Any surgeries you have had. Any medical conditions you have. Any problems you  have had with having bowel movements. Whether you are pregnant or may be pregnant. What are the risks? Generally, this is a safe procedure. However, problems may occur, including: Bleeding. Damage to your intestine. Allergic reactions to medicines given during the procedure. Infection. This is rare. What happens before the procedure? Eating and drinking restrictions Follow instructions from your health care provider about eating or drinking restrictions, which may include: A few days before the procedure: Follow a low-fiber diet. Avoid nuts, seeds, dried fruit, raw fruits, and vegetables. 1-3 days before the procedure: Eat only gelatin dessert or ice pops. Drink only clear liquids, such as water, clear juice, clear broth or bouillon, black coffee or tea, or clear soft drinks or sports drinks. Avoid liquids that contain red or purple dye. The day of the procedure: Do not eat solid foods. You may continue to drink clear liquids until up to 2 hours before the procedure. Do not eat or drink anything starting 2 hours before the procedure, or within the time period that your health care provider recommends. Bowel prep If you were prescribed a bowel prep to take by mouth (orally) to clean out your colon: Take it as told by your health care provider. Starting the day before your procedure, you will need to drink a large amount of liquid medicine. The liquid will cause you to have many bowel movements of loose stool until your stool becomes almost clear or light green. If your skin or the opening between the buttocks (anus) gets irritated from diarrhea, you may relieve the irritation using: Wipes with medicine in them, such as adult wet wipes with aloe and vitamin E. A product to soothe skin, such as petroleum jelly. If you vomit while drinking the bowel prep: Take a break for up to 60 minutes. Begin the bowel prep again. Call your health care provider if you keep vomiting or you cannot take the  bowel prep without vomiting. To clean out your colon, you may also be given: Laxative medicines. These help you have a bowel movement. Instructions for enema use. An enema is liquid medicine injected into your rectum. Medicines Ask your health care provider about: Changing or stopping your regular medicines or supplements. This is especially important if you are taking iron supplements, diabetes medicines, or blood thinners. Taking medicines such as aspirin and ibuprofen. These medicines can thin your blood. Do not take these medicines unless your health care provider tells you to take them. Taking over-the-counter medicines, vitamins, herbs, and supplements. General instructions Ask your health care provider what steps will be taken to help prevent infection. These may include washing skin with a germ-killing soap. If you will be going home right after the procedure, plan to have a responsible adult: Take you home from the hospital or clinic. You will not be allowed to drive. Care for you for the time you are told. What happens during the procedure?  An IV will be inserted into one of your veins. You will be given a medicine to make you fall asleep (general anesthetic). You will lie on  your side with your knees bent. A lubricant will be put on the tube. Then the tube will be: Inserted into your anus. Gently eased through all parts of your large intestine. Air will be sent into your colon to keep it open. This may cause some pressure or cramping. Images will be taken with the camera and will appear on a screen. A small tissue sample may be removed to be looked at under a microscope (biopsy). The tissue may be sent to a lab for testing if any signs of problems are found. If small polyps are found, they may be removed and checked for cancer cells. When the procedure is finished, the tube will be removed. The procedure may vary among health care providers and hospitals. What happens after  the procedure? Your blood pressure, heart rate, breathing rate, and blood oxygen level will be monitored until you leave the hospital or clinic. You may have a small amount of blood in your stool. You may pass gas and have mild cramping or bloating in your abdomen. This is caused by the air that was used to open your colon during the exam. If you were given a sedative during the procedure, it can affect you for several hours. Do not drive or operate machinery until your health care provider says that it is safe. It is up to you to get the results of your procedure. Ask your health care provider, or the department that is doing the procedure, when your results will be ready. Summary A colonoscopy is a procedure to look at the entire large intestine. Follow instructions from your health care provider about eating and drinking before the procedure. If you were prescribed an oral bowel prep to clean out your colon, take it as told by your health care provider. During the colonoscopy, a flexible tube with a camera on its end is inserted into the anus and then passed into all parts of the large intestine. This information is not intended to replace advice given to you by your health care provider. Make sure you discuss any questions you have with your health care provider. Document Revised: 06/17/2022 Document Reviewed: 12/26/2020 Elsevier Patient Education  2024 [employer] Colonoscopy, Adult, Care After The following information offers guidance on how to care for yourself after your procedure. Your health care provider may also give you more specific instructions. If you have problems or questions, contact your health care provider. What can I expect after the procedure? After the procedure, it is common to have: A small amount of blood in your stool for 24 hours after the procedure. Some gas. Mild cramping or bloating of your abdomen. Follow these instructions at home: Eating and  drinking  Drink enough fluid to keep your urine pale yellow. Follow instructions from your health care provider about eating or drinking restrictions. Resume your normal diet as told by your health care provider. Avoid heavy or fried foods that are hard to digest. Activity Rest as told by your health care provider. Avoid sitting for a long time without moving. Get up to take short walks every 1-2 hours. This is important to improve blood flow and breathing. Ask for help if you feel weak or unsteady. Return to your normal activities as told by your health care provider. Ask your health care provider what activities are safe for you. Managing cramping and bloating  Try walking around when you have cramps or feel bloated. If directed, apply heat to your abdomen as told by your  health care provider. Use the heat source that your health care provider recommends, such as a moist heat pack or a heating pad. Place a towel between your skin and the heat source. Leave the heat on for 20-30 minutes. Remove the heat if your skin turns bright red. This is especially important if you are unable to feel pain, heat, or cold. You have a greater risk of getting burned. General instructions If you were given a sedative during the procedure, it can affect you for several hours. Do not drive or operate machinery until your health care provider says that it is safe. For the first 24 hours after the procedure: Do not sign important documents. Do not drink alcohol. Do your regular daily activities at a slower pace than normal. Eat soft foods that are easy to digest. Take over-the-counter and prescription medicines only as told by your health care provider. Keep all follow-up visits. This is important. Contact a health care provider if: You have blood in your stool 2-3 days after the procedure. Get help right away if: You have more than a small spotting of blood in your stool. You have large blood clots in your  stool. You have swelling of your abdomen. You have nausea or vomiting. You have a fever. You have increasing pain in your abdomen that is not relieved with medicine. These symptoms may be an emergency. Get help right away. Call 911. Do not wait to see if the symptoms will go away. Do not drive yourself to the hospital. Summary After the procedure, it is common to have a small amount of blood in your stool. You may also have mild cramping and bloating of your abdomen. If you were given a sedative during the procedure, it can affect you for several hours. Do not drive or operate machinery until your health care provider says that it is safe. Get help right away if you have a lot of blood in your stool, nausea or vomiting, a fever, or increased pain in your abdomen. This information is not intended to replace advice given to you by your health care provider. Make sure you discuss any questions you have with your health care provider. Document Revised: 06/17/2022 Document Reviewed: 12/26/2020 Elsevier Patient Education  2024 [employer] Upper Endoscopy, Adult Upper endoscopy is a procedure to look inside the upper GI (gastrointestinal) tract. The upper GI tract is made up of: The esophagus. This is the part of the body that moves food from your mouth to your stomach. The stomach. The duodenum. This is the first part of your small intestine. This procedure is also called esophagogastroduodenoscopy (EGD) or gastroscopy. In this procedure, your health care provider passes a thin, flexible tube (endoscope) through your mouth and down your esophagus into your stomach and into your duodenum. A small camera is attached to the end of the tube. Images from the camera appear on a monitor in the exam room. During this procedure, your health care provider may also remove a small piece of tissue to be sent to a lab and examined under a microscope (biopsy). Your health care provider may do an upper endoscopy  to diagnose cancers of the upper GI tract. You may also have this procedure to find the cause of other conditions, such as: Stomach pain. Heartburn. Pain or problems when swallowing. Nausea and vomiting. Stomach bleeding. Stomach ulcers. Tell a health care provider about: Any allergies you have. All medicines you are taking, including vitamins, herbs, eye drops, creams, and  over-the-counter medicines. Any problems you or family members have had with anesthetic medicines. Any bleeding problems you have. Any surgeries you have had. Any medical conditions you have. Whether you are pregnant or may be pregnant. What are the risks? Your healthcare provider will talk with you about risks. These may include: Infection. Bleeding. Allergic reactions to medicines. A tear or hole (perforation) in the esophagus, stomach, or duodenum. What happens before the procedure? When to stop eating and drinking Follow instructions from your health care provider about what you may eat and drink. These may include: 8 hours before your procedure Stop eating most foods. Do not eat meat, fried foods, or fatty foods. Eat only light foods, such as toast or crackers. All liquids are okay except energy drinks and alcohol. 6 hours before your procedure Stop eating. Drink only clear liquids, such as water, clear fruit juice, black coffee, plain tea, and sports drinks. Do not drink energy drinks or alcohol. 2 hours before your procedure Stop drinking all liquids. You may be allowed to take medicines with small sips of water. If you do not follow your health care provider's instructions, your procedure may be delayed or canceled. Medicines Ask your health care provider about: Changing or stopping your regular medicines. This is especially important if you are taking diabetes medicines or blood thinners. Taking medicines such as aspirin and ibuprofen. These medicines can thin your blood. Do not take these medicines  unless your health care provider tells you to take them. Taking over-the-counter medicines, vitamins, herbs, and supplements. General instructions If you will be going home right after the procedure, plan to have a responsible adult: Take you home from the hospital or clinic. You will not be allowed to drive. Care for you for the time you are told. What happens during the procedure?  An IV will be inserted into one of your veins. You may be given one or more of the following: A medicine to help you relax (sedative). A medicine to numb the throat (local anesthetic). You will lie on your left side on an exam table. Your health care provider will pass the endoscope through your mouth and down your esophagus. Your health care provider will use the scope to check the inside of your esophagus, stomach, and duodenum. Biopsies may be taken. The endoscope will be removed. The procedure may vary among health care providers and hospitals. What happens after the procedure? Your blood pressure, heart rate, breathing rate, and blood oxygen level will be monitored until you leave the hospital or clinic. When your throat is no longer numb, you may be given some fluids to drink. If you were given a sedative during the procedure, it can affect you for several hours. Do not drive or operate machinery until your health care provider says that it is safe. It is up to you to get the results of your procedure. Ask your health care provider, or the department that is doing the procedure, when your results will be ready. Contact a health care provider if you: Have a sore throat that lasts longer than 1 day. Have a fever. Get help right away if you: Vomit blood or your vomit looks like coffee grounds. Have bloody, black, or tarry stools. Have a very bad sore throat or you cannot swallow. Have difficulty breathing or very bad pain in your chest or abdomen. These symptoms may be an emergency. Get help right away.  Call 911. Do not wait to see if the symptoms will go  away. Do not drive yourself to the hospital. Summary Upper endoscopy is a procedure to look inside the upper GI tract. During the procedure, an IV will be inserted into one of your veins. You may be given a medicine to help you relax. The endoscope will be passed through your mouth and down your esophagus. Follow instructions from your health care provider about what you can eat and drink. This information is not intended to replace advice given to you by your health care provider. Make sure you discuss any questions you have with your health care provider. Document Revised: 08/14/2021 Document Reviewed: 08/14/2021 Elsevier Patient Education  2024 [employer].

## 2023-02-04 ENCOUNTER — Ambulatory Visit: Payer: Medicare PPO | Admitting: Orthopedic Surgery

## 2023-02-04 ENCOUNTER — Telehealth: Payer: Self-pay

## 2023-02-04 ENCOUNTER — Encounter: Payer: Self-pay | Admitting: Orthopedic Surgery

## 2023-02-04 VITALS — BP 110/62 | HR 80 | Ht 75.0 in | Wt 219.0 lb

## 2023-02-04 DIAGNOSIS — M7522 Bicipital tendinitis, left shoulder: Secondary | ICD-10-CM | POA: Diagnosis not present

## 2023-02-04 DIAGNOSIS — M25512 Pain in left shoulder: Secondary | ICD-10-CM

## 2023-02-04 DIAGNOSIS — Z79899 Other long term (current) drug therapy: Secondary | ICD-10-CM

## 2023-02-04 MED ORDER — ROSUVASTATIN CALCIUM 20 MG PO TABS: 20.0000 mg | ORAL_TABLET | Freq: Every day | ORAL | 2 refills | Status: DC

## 2023-02-04 NOTE — Telephone Encounter (Signed)
I spoke with wife and discussed lab results and medication changes. Mr.Zody will start crestor a few days after his colonoscopy and repeat labs at [facility] in 2 months. I will copy pcp lab results.

## 2023-02-04 NOTE — Progress Notes (Signed)
Return patient Visit  Assessment: Kelly [name] is a 67 y.o. male with the following: 1. Acute pain of left shoulder 2.  Left biceps tendinitis  Plan: Kelly [name] continues to have pain in the left shoulder.  Prior subacromial steroid injection improved symptoms for about a week.  However, he is having pain in the anterior aspect of the shoulder.  He has tenderness to palpation.  It is painful with motion.  On physical exam, the pain is localized to the long head of the biceps tendon.  As such, I have recommended we proceed with an ultrasound-guided injection of the biceps tendon sheath.  This was completed in clinic today.  I provided him with home exercises.  If he does not have sustained improvement, we may have to consider further evaluation of left shoulder.  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  He will follow-up in 2 months, unless he has issues.  Procedure note injection - Left proximal biceps tendon sheath, ultrasound guidance   Verbal consent was obtained to inject the Left shoulder, biceps tendon sheath Timeout was completed to confirm the site of injection.   Using the ultrasound, the long head of the biceps was identified.   The skin was prepped with alcohol and ethyl chloride was sprayed at the injection site.  A 21-gauge needle was used to inject 40 mg of Depo-Medrol and 1% lidocaine (1 cc) into the tendon sheath around the long head of the biceps of the Left shoulder using a direct anterior approach.  The needle was visualized entering the tendon sheath, and the injections was also visualized. There were no complications.  A sterile bandage was applied.   Note: In order to accurately identify the placement of the needle, ultrasound was required, to increase the accuracy, and specificity of the injection.     Follow-up: Return in about 2 months (around 04/06/2023).  Subjective:  Chief Complaint  Patient  presents with   Shoulder Pain    Left     History of Present Illness: Kelly [name] is a 67 y.o. male who returns to clinic for repeat evaluation of left shoulder pain.  I saw him in clinic approximately 6 weeks ago.  At that time, I injected his left shoulder.  He had improvement in his symptoms for about a week.  However, the pain has returned.  Pain is primarily in the anterior aspect of the left shoulder.  The pain over the lateral the left shoulder.  He has difficulty with overhead motion.  He has stopped taking Tylenol.  Review of Systems: No fevers or chills No numbness or tingling No chest pain No shortness of breath No bowel or bladder dysfunction No GI distress No headaches    Objective: BP 110/62   Pulse 80   Ht 6\' 3"  (1.905 m)   Wt 219 lb (99.3 kg)   BMI 27.37 kg/m   Physical Exam:  General: Elderly male., Alert and oriented., and No acute distress. Gait: Normal gait.  Prior surgical incisions have healed.  No surrounding erythema or drainage.  Tenderness to palpation of the anterior shoulder, in line with the biceps tendon.   Active forward flexion to 110 degrees.  Passively, I can get him to 160 degrees but he has a lot of pain.  Tenderness over the biceps tendon proximally.  Positive O'Brien's.  Positive Jobe's.  Negative belly press.  Pacemaker is palpable and visible  just medial to the glenohumeral joint.  IMAGING: I personally reviewed images previously obtained in clinic  No new imaging obtained today  New Medications:  No orders of the defined types were placed in this encounter.     Oliver Barre, MD  02/04/2023 11:13 AM

## 2023-02-04 NOTE — Telephone Encounter (Signed)
-----   Message from Laurann Montana sent at 02/03/2023  4:31 PM EDT ----- Please let pt know labs look stable compared to recent values, good news. Mild anemia/low platelet continue but are slightly improved/stable. Liver function looks closer to normal. I would like to switch his simvastatin to rosuvastatin but would plan to make the change a few days after his colonoscopy so that we do not change anything acutely that would interrupt his procedure - would stop simvastatin start rosuvastatin 20mg  daily a few days after colonoscopy once feeling back on his feet, with repeat CMET/fasting lipid profile in 2 months.

## 2023-02-04 NOTE — Patient Instructions (Signed)
Instructions Following Joint Injections  In clinic today, you received an injection in one of your joints (sometimes more than one).  Occasionally, you can have some pain at the injection site, this is normal.  You can place ice at the injection site, or take over-the-counter medications such as Tylenol (acetaminophen) or Advil (ibuprofen).  Please follow all directions listed on the bottle.  If your joint (knee or shoulder) becomes swollen, red or very painful, please contact the clinic for additional assistance.   Two medications were injected, including lidocaine and a steroid (often referred to as cortisone).  Lidocaine is effective almost immediately but wears off quickly.  However, the steroid can take a few days to improve your symptoms.  In some cases, it can make your pain worse for a couple of days.  Do not be concerned if this happens as it is common.  You can apply ice or take some over-the-counter medications as needed.   Injections in the same joint cannot be repeated for 3 months.  This helps to limit the risk of an infection in the joint.  If you were to develop an infection in your joint, the best treatment option would be surgery.    Rotator Cuff Tear/Tendinitis Rehab   Ask your health care provider which exercises are safe for you. Do exercises exactly as told by your health care provider and adjust them as directed. It is normal to feel mild stretching, pulling, tightness, or discomfort as you do these exercises. Stop right away if you feel sudden pain or your pain gets worse. Do not begin these exercises until told by your health care provider. Stretching and range-of-motion exercises  These exercises warm up your muscles and joints and improve the movement and flexibility of your shoulder. These exercises also help to relieve pain.  Shoulder pendulum In this exercise, you let the injured arm dangle toward the floor and then swing it like a clock pendulum. Stand near a table  or counter that you can hold onto for balance. Bend forward at the waist and let your left / right arm hang straight down. Use your other arm to support you and help you stay balanced. Relax your left / right arm and shoulder muscles, and move your hips and your trunk so your left / right arm swings freely. Your arm should swing because of the motion of your body, not because you are using your arm or shoulder muscles. Keep moving your hips and trunk so your arm swings in the following directions, as told by your health care provider: Side to side. Forward and backward. In clockwise and counterclockwise circles. Slowly return to the starting position. Repeat 10 times, or for 10 seconds per direction. Complete this exercise 2-3 times a day.      Shoulder flexion, seated This exercise is sometimes called table slides. In this exercise, you raise your arm in front of your body until you feel a stretch in your injured shoulder. Sit in a stable chair so your left / right forearm can rest on a flat surface. Your elbow should rest at a height that keeps your upper arm next to your body. Keeping your left / right shoulder relaxed, lean forward at the waist and let your hand slide forward (flexion). Stop when you feel a stretch in your shoulder, or when you reach the angle that is recommended by your health care provider. Hold for 5 seconds. Slowly return to the starting position. Repeat 10 times. Complete this  exercise 1-2  times a day.       Shoulder flexion, standing In this exercise, you raise your arm in front of your body (flexion) until you feel a stretch in your injured shoulder. Stand and hold a broomstick, a cane, or a similar object. Place your hands a little more than shoulder-width apart on the object. Your left / right hand should be palm-up, and your other hand should be palm-down. Keep your elbow straight and your shoulder muscles relaxed. Push the stick up with your healthy arm to raise  your left / right arm in front of your body, and then over your head until you feel a stretch in your shoulder. Avoid shrugging your shoulder while you raise your arm. Keep your shoulder blade tucked down toward the middle of your back. Keep your left / right shoulder muscles relaxed. Hold for 10 seconds. Slowly return to the starting position. Repeat 10 times. Complete this exercise 1-2 times a day.      Shoulder abduction, active-assisted You will need a stick, broom handle, or similar object to help you (assist) in doing this exercise. Lie on your back. This is the supine position. Hold a broomstick, a cane, or a similar object. Place your hands a little more than shoulder-width apart on the object. Your left / right hand should be palm-up, and your other hand should be palm-down. Keeping your shoulder relaxed, push the stick to raise your left / right arm out to your side (abduction) and then over your head. Use your other hand to help move the stick. Stop when you feel a stretch in your shoulder, or when you reach the angle that is recommended by your health care provider. Avoid shrugging your shoulder while you raise your arm. Keep your shoulder blade tucked down toward the middle of your back. Hold for 10 seconds. Slowly return to the starting position. Repeat 10 times. Complete this exercise 1-2 times a day.      Shoulder flexion, active-assisted Lie on your back. You may bend your knees for comfort. Hold a broomstick, a cane, or a similar object so that your hands are about shoulder-width apart. Your palms should face toward your feet. Raise your left / right arm over your head, then behind your head toward the floor (flexion). Use your other hand to help you do this (active-assisted). Stop when you feel a gentle stretch in your shoulder, or when you reach the angle that is recommended by your health care provider. Hold for 10 seconds. Use the stick and your other arm to help you  return your left / right arm to the starting position. Repeat 10 times. Complete this exercise 1-2 times a day.      External rotation Sit in a stable chair without armrests, or stand up. Tuck a soft object, such as a folded towel or a small ball, under your left / right upper arm. Hold a broomstick, a cane, or a similar object with your palms face-down, toward the floor. Bend your elbows to a 90-degree angle (right angle), and keep your hands about shoulder-width apart. Straighten your healthy arm and push the stick across your body, toward your left / right side. Keep your left / right arm bent. This will rotate your left / right forearm away from your body (external rotation). Hold for 10 seconds. Slowly return to the starting position. Repeat 10 times. Complete this exercise 1-2 times a day.        Strengthening exercises  These exercises build strength and endurance in your shoulder. Endurance is the ability to use your muscles for a long time, even after they get tired. Do not start doing these exercises until your health care provider approves. Shoulder flexion, isometric Stand or sit in a doorway, facing the door frame. Keep your left / right arm straight and make a gentle fist with your hand. Place your fist against the door frame. Only your fist should be touching the frame. Keep your upper arm at your side. Gently press your fist against the door frame, as if you are trying to raise your arm above your head (isometric shoulder flexion). Avoid shrugging your shoulder while you press your hand into the door frame. Keep your shoulder blade tucked down toward the middle of your back. Hold for 10 seconds. Slowly release the tension, and relax your muscles completely before you repeat the exercise. Repeat 10 times. Complete this exercise 3 times per week.      Shoulder abduction, isometric Stand or sit in a doorway. Your left / right arm should be closest to the door frame. Keep your  left / right arm straight, and place the back of your hand against the door frame. Only your hand should be touching the frame. Keep the rest of your arm close to your side. Gently press the back of your hand against the door frame, as if you are trying to raise your arm out to the side (isometric shoulder abduction). Avoid shrugging your shoulder while you press your hand into the door frame. Keep your shoulder blade tucked down toward the middle of your back. Hold for 10 seconds. Slowly release the tension, and relax your muscles completely before you repeat the exercise. Repeat 10 times. Complete this exercise 3 times per week.      Internal rotation, isometric This is an exercise in which you press your palm against a door frame without moving your shoulder joint (isometric). Stand or sit in a doorway, facing the door frame. Bend your left / right elbow, and place the palm of your hand against the door frame. Only your palm should be touching the frame. Keep your upper arm at your side. Gently press your hand against the door frame, as if you are trying to push your arm toward your abdomen (internal rotation). Gradually increase the pressure until you are pressing as hard as you can. Stop increasing the pressure if you feel shoulder pain. Avoid shrugging your shoulder while you press your hand into the door frame. Keep your shoulder blade tucked down toward the middle of your back. Hold for 10 seconds. Slowly release the tension, and relax your muscles completely before you repeat the exercise. Repeat 10 times. Complete this exercise 3 times per week.      External rotation, isometric This is an exercise in which you press the back of your wrist against a door frame without moving your shoulder joint (isometric). Stand or sit in a doorway, facing the door frame. Bend your left / right elbow and place the back of your wrist against the door frame. Only the back of your wrist should be  touching the frame. Keep your upper arm at your side. Gently press your wrist against the door frame, as if you are trying to push your arm away from your abdomen (external rotation). Gradually increase the pressure until you are pressing as hard as you can. Stop increasing the pressure if you feel pain. Avoid shrugging your shoulder while  you press your wrist into the door frame. Keep your shoulder blade tucked down toward the middle of your back. Hold for 10 seconds. Slowly release the tension, and relax your muscles completely before you repeat the exercise. Repeat 10 times. Complete this exercise 3 times per week.       Scapular retraction Sit in a stable chair without armrests, or stand up. Secure an exercise band to a stable object in front of you so the band is at shoulder height. Hold one end of the exercise band in each hand. Your palms should face down. Squeeze your shoulder blades together (retraction) and move your elbows slightly behind you. Do not shrug your shoulders upward while you do this. Hold for 10 seconds. Slowly return to the starting position. Repeat 10 times. Complete this exercise 3 times per week.      Shoulder extension Sit in a stable chair without armrests, or stand up. Secure an exercise band to a stable object in front of you so the band is above shoulder height. Hold one end of the exercise band in each hand. Straighten your elbows and lift your hands up to shoulder height. Squeeze your shoulder blades together and pull your hands down to the sides of your thighs (extension). Stop when your hands are straight down by your sides. Do not let your hands go behind your body. Hold for 10 seconds. Slowly return to the starting position. Repeat 10 times. Complete this exercise 3 times per week.       Scapular protraction, supine Lie on your back on a firm surface (supine position). Hold a 5 lbs (or soup can) weight in your left / right hand. Raise your left  / right arm straight into the air so your hand is directly above your shoulder joint. Push the weight into the air so your shoulder (scapula) lifts off the surface that you are lying on. The scapula will push up or forward (protraction). Do not move your head, neck, or back. Hold for 10 seconds. Slowly return to the starting position. Let your muscles relax completely before you repeat this exercise.  Repeat 10 times. Complete this exercise 3 times per week.

## 2023-02-06 ENCOUNTER — Ambulatory Visit (HOSPITAL_COMMUNITY)
Admission: RE | Admit: 2023-02-06 | Discharge: 2023-02-06 | Disposition: A | Discharge status: Home / Self Care | Payer: Medicare PPO | Attending: Internal Medicine | Admitting: Internal Medicine

## 2023-02-06 ENCOUNTER — Ambulatory Visit (HOSPITAL_COMMUNITY): Payer: Medicare PPO | Admitting: Certified Registered"

## 2023-02-06 ENCOUNTER — Encounter (HOSPITAL_COMMUNITY): Payer: Self-pay

## 2023-02-06 ENCOUNTER — Encounter (HOSPITAL_COMMUNITY)
Admission: RE | Disposition: A | Discharge status: Home / Self Care | Payer: Self-pay | Source: Home / Self Care | Attending: Internal Medicine

## 2023-02-06 DIAGNOSIS — Z833 Family history of diabetes mellitus: Secondary | ICD-10-CM | POA: Diagnosis not present

## 2023-02-06 DIAGNOSIS — K5790 Diverticulosis of intestine, part unspecified, without perforation or abscess without bleeding: Secondary | ICD-10-CM

## 2023-02-06 DIAGNOSIS — Z87891 Personal history of nicotine dependence: Secondary | ICD-10-CM | POA: Diagnosis not present

## 2023-02-06 DIAGNOSIS — Z980 Intestinal bypass and anastomosis status: Secondary | ICD-10-CM | POA: Insufficient documentation

## 2023-02-06 DIAGNOSIS — I252 Old myocardial infarction: Secondary | ICD-10-CM | POA: Insufficient documentation

## 2023-02-06 DIAGNOSIS — Z951 Presence of aortocoronary bypass graft: Secondary | ICD-10-CM | POA: Insufficient documentation

## 2023-02-06 DIAGNOSIS — K219 Gastro-esophageal reflux disease without esophagitis: Secondary | ICD-10-CM | POA: Insufficient documentation

## 2023-02-06 DIAGNOSIS — K5730 Diverticulosis of large intestine without perforation or abscess without bleeding: Secondary | ICD-10-CM | POA: Diagnosis not present

## 2023-02-06 DIAGNOSIS — I509 Heart failure, unspecified: Secondary | ICD-10-CM | POA: Insufficient documentation

## 2023-02-06 DIAGNOSIS — K7460 Unspecified cirrhosis of liver: Secondary | ICD-10-CM | POA: Diagnosis not present

## 2023-02-06 DIAGNOSIS — K259 Gastric ulcer, unspecified as acute or chronic, without hemorrhage or perforation: Secondary | ICD-10-CM

## 2023-02-06 DIAGNOSIS — G4730 Sleep apnea, unspecified: Secondary | ICD-10-CM | POA: Diagnosis not present

## 2023-02-06 DIAGNOSIS — Z9884 Bariatric surgery status: Secondary | ICD-10-CM | POA: Diagnosis not present

## 2023-02-06 DIAGNOSIS — I25119 Atherosclerotic heart disease of native coronary artery with unspecified angina pectoris: Secondary | ICD-10-CM | POA: Insufficient documentation

## 2023-02-06 DIAGNOSIS — K279 Peptic ulcer, site unspecified, unspecified as acute or chronic, without hemorrhage or perforation: Secondary | ICD-10-CM | POA: Insufficient documentation

## 2023-02-06 DIAGNOSIS — E1151 Type 2 diabetes mellitus with diabetic peripheral angiopathy without gangrene: Secondary | ICD-10-CM | POA: Diagnosis not present

## 2023-02-06 DIAGNOSIS — I2510 Atherosclerotic heart disease of native coronary artery without angina pectoris: Secondary | ICD-10-CM

## 2023-02-06 DIAGNOSIS — D649 Anemia, unspecified: Secondary | ICD-10-CM | POA: Insufficient documentation

## 2023-02-06 DIAGNOSIS — F32A Depression, unspecified: Secondary | ICD-10-CM | POA: Insufficient documentation

## 2023-02-06 DIAGNOSIS — Z13810 Encounter for screening for upper gastrointestinal disorder: Secondary | ICD-10-CM

## 2023-02-06 DIAGNOSIS — I110 Hypertensive heart disease with heart failure: Secondary | ICD-10-CM | POA: Insufficient documentation

## 2023-02-06 DIAGNOSIS — Z1211 Encounter for screening for malignant neoplasm of colon: Secondary | ICD-10-CM | POA: Diagnosis not present

## 2023-02-06 DIAGNOSIS — Z794 Long term (current) use of insulin: Secondary | ICD-10-CM | POA: Insufficient documentation

## 2023-02-06 DIAGNOSIS — F419 Anxiety disorder, unspecified: Secondary | ICD-10-CM | POA: Diagnosis not present

## 2023-02-06 HISTORY — PX: ESOPHAGOGASTRODUODENOSCOPY (EGD) WITH PROPOFOL: SHX5813

## 2023-02-06 HISTORY — PX: COLONOSCOPY WITH PROPOFOL: SHX5780

## 2023-02-06 LAB — GLUCOSE, CAPILLARY: Glucose-Capillary: 141 mg/dL — ABNORMAL HIGH (ref 70–99)

## 2023-02-06 SURGERY — COLONOSCOPY WITH PROPOFOL
Anesthesia: General

## 2023-02-06 MED ORDER — PROPOFOL 10 MG/ML IV BOLUS
INTRAVENOUS | Status: DC | PRN
  Administered 2023-02-06: 100 mg via INTRAVENOUS

## 2023-02-06 MED ORDER — PHENYLEPHRINE 80 MCG/ML (10ML) SYRINGE FOR IV PUSH (FOR BLOOD PRESSURE SUPPORT)
PREFILLED_SYRINGE | INTRAVENOUS | Status: AC
  Filled 2023-02-06: qty 10

## 2023-02-06 MED ORDER — LACTATED RINGERS IV SOLN
INTRAVENOUS | Status: DC | PRN
Start: 2023-02-06 — End: 2023-02-06

## 2023-02-06 MED ORDER — PHENYLEPHRINE 80 MCG/ML (10ML) SYRINGE FOR IV PUSH (FOR BLOOD PRESSURE SUPPORT)
PREFILLED_SYRINGE | INTRAVENOUS | Status: DC | PRN
Start: 2023-02-06 — End: 2023-02-06
  Administered 2023-02-06 (×3): 160 ug via INTRAVENOUS

## 2023-02-06 MED ORDER — LIDOCAINE HCL (CARDIAC) PF 100 MG/5ML IV SOSY
PREFILLED_SYRINGE | INTRAVENOUS | Status: DC | PRN
  Administered 2023-02-06: 100 mg via INTRAVENOUS

## 2023-02-06 MED ORDER — PROPOFOL 1000 MG/100ML IV EMUL
INTRAVENOUS | Status: AC
  Filled 2023-02-06: qty 100

## 2023-02-06 MED ORDER — PROPOFOL 500 MG/50ML IV EMUL
INTRAVENOUS | Status: DC | PRN
  Administered 2023-02-06: 125 ug/kg/min via INTRAVENOUS

## 2023-02-06 NOTE — H&P (Signed)
Primary Care Physician:  Benita Stabile, MD Primary Gastroenterologist:  Dr. Marletta Lor  Pre-Procedure History & Physical: HPI:  Kelly [name] is a 67 y.o. male is here for an upper endoscopy for cirrhosis/variceal screening and a colonoscopy to be performed for colon cancer screening purposes.  Past Medical History:  Diagnosis Date   AICD (automatic cardioverter/defibrillator) present    Anemia    Anxiety    Aortic stenosis    Arthritis    Atrial fibrillation [facility])    Atrial fibrillation (HCC)    CHF (congestive heart failure) (HCC)    Chronic pain    Cirrhosis (HCC)    Coronary atherosclerosis of native coronary artery    BMS LAD and PTCA RCA 2000,BMS RCA/PLA 2001, CABG June 2018   Depression    Diverticulitis    Essential hypertension    Fatty liver    Gastric ulcer    GERD (gastroesophageal reflux disease)    Hyperlipidemia    Low-grade NHL (non-Hodgkin's lymphoma) 2007   Lupus anticoagulant positive    MI (myocardial infarction) (HCC) 2001   Pacemaker    Paresthesia of upper limb    Peripheral neuropathy    Peripheral vascular disease (HCC)    blood clots in legs   Sleep apnea    Thrombocytopenia (HCC)    Trifascicular block    Type 2 diabetes mellitus (HCC)    Umbilical hernia     Past Surgical History:  Procedure Laterality Date   Axillary abscess     left incision and drainage left   BACK SURGERY     multiple   BIOPSY  08/15/2014   Procedure: BIOPSY;  Surgeon: West Bali, MD;  Location: [facility];  Service: Endoscopy;;  Gastric   BIOPSY  01/19/2015   Procedure: BIOPSY (GASTRIC ULCER);  Surgeon: West Bali, MD;  Location: [facility];  Service: Endoscopy;;   CARDIAC CATHETERIZATION     cardiac stents     x5 stents   COLONOSCOPY  June 2008   Dr. Tinnie Gens Medoff: Mild left colonic diverticulosis   CORONARY ARTERY BYPASS GRAFT N/A 11/11/2016   Procedure: CORONARY ARTERY BYPASS GRAFTING (CABG) x 3, USING LEFT MAMMARY ARTERY AND RIGHT GREATER SAPHENOUS VEIN  HARVESTED ENDOSCOPICALLY;  Surgeon: Kerin Perna, MD;  Location: [facility] OR;  Service: Open Heart Surgery;  Laterality: N/A;   CORONARY PRESSURE/FFR STUDY N/A 09/26/2016   Procedure: Intravascular Pressure Wire/FFR Study;  Surgeon: Yvonne Kendall, MD;  Location: [facility] INVASIVE CV LAB;  Service: Cardiovascular;  Laterality: N/A;   Dental extractions     ESOPHAGOGASTRODUODENOSCOPY  June 2008   Dr. Tinnie Gens Medoff: Gastric ulcer, antral, biopsy with reactive changes associated glandular atrophy, no H pylori. No features of lymphoma. Likely NSAID related   ESOPHAGOGASTRODUODENOSCOPY  August 2008   Dr. Tinnie Gens Medoff: Complete healing of gastric ulcers.   ESOPHAGOGASTRODUODENOSCOPY (EGD) WITH PROPOFOL N/A 08/15/2014   SLF: 1. Abnormal pain due to ulcers & gastriitis    ESOPHAGOGASTRODUODENOSCOPY (EGD) WITH PROPOFOL N/A 01/19/2015   SLF: 1. Persistant ulcer with firm base 2. single gastric polyp found in the gastric antrum. Ulcer base    EUS N/A 03/21/2015   Thickened gastric wall in pre-pyloric antrum, no obvious intramural mass, fatty pancreas, no obvious pancreatic mass.    Inguinal lymph node biopsy     right inguinal lymph node biopsy   LEAD EXTRACTION N/A 11/14/2022   Procedure: LEAD EXTRACTION;  Surgeon: Lanier Prude, MD;  Location: [facility] INVASIVE CV LAB;  Service: Cardiovascular;  Laterality: N/A;   LEFT HEART CATH AND CORONARY ANGIOGRAPHY N/A 09/26/2016   Procedure: Left Heart Cath and Coronary Angiography;  Surgeon: Yvonne Kendall, MD;  Location: [facility];  Service: Cardiovascular;  Laterality: N/A;   LEFT HEART CATH AND CORS/GRAFTS ANGIOGRAPHY N/A 06/29/2020   Procedure: LEFT HEART CATH AND CORS/GRAFTS ANGIOGRAPHY;  Surgeon: Tonny Bollman, MD;  Location: [facility] INVASIVE CV LAB;  Service: Cardiovascular;  Laterality: N/A;   NECK SURGERY     PACEMAKER IMPLANT N/A 03/12/2020   Procedure: PACEMAKER IMPLANT;  Surgeon: Lanier Prude, MD;  Location: [facility] CV LAB;  Service:  Cardiovascular;  Laterality: N/A;   POLYPECTOMY  01/19/2015   Procedure: POLYPECTOMY (GASTRIC);  Surgeon: West Bali, MD;  Location: [facility];  Service: Endoscopy;;   ROUX-EN-Y GASTRIC BYPASS     RNY gastric bypass   SHOULDER ARTHROSCOPY Left    TEE WITHOUT CARDIOVERSION N/A 11/11/2016   Procedure: TRANSESOPHAGEAL ECHOCARDIOGRAM (TEE);  Surgeon: Donata Clay, Theron Arista, MD;  Location: [facility] OR;  Service: Open Heart Surgery;  Laterality: N/A;    Prior to Admission medications   Medication Sig Start Date End Date Taking? Authorizing Provider  acetaminophen (TYLENOL) 500 MG tablet Take 500-1,000 mg by mouth every 6 (six) hours as needed (for pain.).   Yes [provider]  Ascorbic Acid (VITAMIN C) 500 MG CAPS Take 500 mg by mouth daily.   Yes [provider]  aspirin EC 81 MG tablet Take 1 tablet (81 mg total) by mouth daily. 03/16/20  Yes Lanier Prude, MD  B Complex-C (B-COMPLEX WITH VITAMIN C) tablet Take 1 tablet by mouth daily.   Yes [provider]  cetirizine (ZYRTEC) 10 MG tablet Take 10 mg by mouth daily as needed for allergies.   Yes [provider]  Cholecalciferol (VITAMIN D3) 50 MCG (2000 UT) TABS Take 2,000 Units by mouth daily.   Yes [provider]  enoxaparin (LOVENOX) 150 MG/ML injection Inject 1 mL (150 mg total) into the skin daily. 02/02/23  Yes Jonelle Sidle, MD  ferrous sulfate 325 (65 FE) MG tablet Take 325 mg by mouth 2 (two) times daily.   Yes [provider]  FLUoxetine (PROZAC) 20 MG capsule Take 20 mg by mouth daily.   Yes [provider]  glucose blood (ONETOUCH VERIO) test strip USE AS DIRECTED FOUR- FIVE TIMES DAILY 09/18/17  Yes Nida, Denman George, MD  IRON, FERROUS SULFATE, PO Take 65 mg by mouth in the morning and at bedtime.   Yes [provider]  isosorbide mononitrate (IMDUR) 30 MG 24 hr tablet Take 1 tablet (30 mg total) by mouth daily. 12/25/22 03/25/23 Yes Jonelle Sidle, MD   lactulose (CHRONULAC) 10 GM/15ML solution Take 15 mLs (10 g total) by mouth daily. 12/18/22  Yes Letta Median, PA-C  Magnesium 250 MG TABS Take 250 mg by mouth See admin instructions. Take 250 mg in the morning, may take another 250 mg dose up to 3 more times a day as needed for muscle cramps   Yes [provider]  Menthol-Methyl Salicylate (MUSCLE RUB) 10-15 % CREA Apply 1 Application topically as needed for muscle pain.   Yes [provider]  Multiple Vitamin (MULTIVITAMIN WITH MINERALS) TABS tablet Take 1 tablet by mouth in the morning and at bedtime.   Yes [provider]  ONETOUCH VERIO test strip USE TO TEST FOUR TIMES DAILY AS DIRECTED 12/22/17  Yes Nida, Denman George, MD  Oxycodone HCl 10  MG TABS Take 10 mg by mouth 2 (two) times daily as needed (pain).   Yes [provider]  pantoprazole (PROTONIX) 40 MG tablet TAKE 1 TABLET BY MOUTH DAILY BEFORE BREAKFAST 09/15/19  Yes Letta Median, PA-C  Pediatric Multivitamins-[facility] COMPLETE PO) Take 1 tablet by mouth daily.   Yes [provider]  rosuvastatin (CRESTOR) 20 MG tablet Take 1 tablet (20 mg total) by mouth daily. 02/04/23  Yes Dunn, Dayna N, PA-C  sacubitril-valsartan (ENTRESTO) 24-26 MG Take 1 tablet by mouth 2 (two) times daily. 12/25/22  Yes Jonelle Sidle, MD  Sodium Bicarbonate-Citric Acid (ALKA-SELTZER HEARTBURN) 1940-1000 MG TBEF Take 1-2 tablets by mouth 2 (two) times daily as needed (heartburn/indigestion).   Yes [provider]  warfarin (COUMADIN) 5 MG tablet TAKE 1 AND 1/2 TABLETS BY MOUTH DAILY EXCEPT 1 TABLET ON TUESDAYS, THURSDAY AND SATURDAYS OR AS DIRECTED 08/28/22  Yes Jonelle Sidle, MD  gabapentin (NEURONTIN) 600 MG tablet Take 600 mg by mouth at bedtime.    [provider]  Insulin Glargine (BASAGLAR KWIKPEN) 100 UNIT/ML SOPN INJECT 80 UNITS INTO SKIN DAILY AT 10 PM Patient taking differently: Inject 50-80 Units into the skin at bedtime  as needed (high blood sugar). 10/20/17   Roma Kayser, MD  nitroGLYCERIN (NITROSTAT) 0.4 MG SL tablet Place 1 tablet (0.4 mg total) under the tongue every 5 (five) minutes x 3 doses as needed for chest pain. 04/10/21   Jonelle Sidle, MD    Allergies as of 12/29/2022   (No Known Allergies)    Family History  Problem Relation Age of Onset   Pancreatic cancer Maternal Grandmother    Obesity Mother    Hypertension Mother    Diabetes Father    Thyroid disease Father    Heart disease Father    Colon cancer Neg Hx     Social History   Socioeconomic History   Marital status: Married    Spouse name: Not on file   Number of children: Not on file   Years of education: Not on file   Highest education level: Not on file  Occupational History   Not on file  Tobacco Use   Smoking status: Former    Current packs/day: 0.00    Average packs/day: 1 pack/day for 23.0 years (23.0 ttl pk-yrs)    Types: Cigarettes    Start date: 10/30/1970    Quit date: 10/29/1993    Years since quitting: 29.2   Smokeless tobacco: Former    Types: Snuff, Chew  Vaping Use   Vaping status: Never Used  Substance and Sexual Activity   Alcohol use: No    Alcohol/week: 0.0 standard drinks of alcohol   Drug use: No   Sexual activity: Never    Birth control/protection: None  Other Topics Concern   Not on file  Social History Narrative   Not on file   Social Determinants of Health   Financial Resource Strain: High Risk (10/17/2020)   Overall Financial Resource Strain (CARDIA)    Difficulty of Paying Living Expenses: Hard  Food Insecurity: No Food Insecurity (11/14/2022)   Hunger Vital Sign    Worried About Running Out of Food in the Last Year: Never true    Ran Out of Food in the Last Year: Never true  Transportation Needs: No Transportation Needs (11/14/2022)   PRAPARE - Administrator, Civil Service (Medical): No    Lack of Transportation (Non-Medical): No  Physical Activity:  Sufficiently Active (10/17/2020)   Exercise Vital Sign    Days of Exercise per Week: 7 days    Minutes of Exercise per Session: 60 min  Stress: Stress Concern Present (10/17/2020)   [employer] of Occupational Health - Occupational Stress Questionnaire    Feeling of Stress : Very much  Social Connections: Unknown (09/30/2021)   [facility] from [facility]   Social Network    Social Network: Not on file  Intimate Partner Violence: Not At Risk (11/14/2022)   Humiliation, Afraid, Rape, and Kick questionnaire    Fear of Current or Ex-Partner: No    Emotionally Abused: No    Physically Abused: No    Sexually Abused: No    Review of Systems: See HPI, otherwise negative ROS  Physical Exam: Vital signs in last 24 hours: Temp:  [97.6 F (36.4 C)] 97.6 F (36.4 C) (09/20 0653) Pulse Rate:  [65] 65 (09/20 0653) Resp:  [15] 15 (09/20 0653) BP: (114)/(60) 114/60 (09/20 0653) SpO2:  [97 %] 97 % (09/20 0653) Weight:  [99.3 kg] 99.3 kg (09/20 0653)   General:   Alert,  Well-developed, well-nourished, pleasant and cooperative in NAD Head:  Normocephalic and atraumatic. Eyes:  Sclera clear, no icterus.   Conjunctiva pink. Ears:  Normal auditory acuity. Nose:  No deformity, discharge,  or lesions. Msk:  Symmetrical without gross deformities. Normal posture. Extremities:  Without clubbing or edema. Neurologic:  Alert and  oriented x4;  grossly normal neurologically. Skin:  Intact without significant lesions or rashes. Psych:  Alert and cooperative. Normal mood and affect.  Impression/Plan: Kelly [name] is here for an upper endoscopy for cirrhosis/variceal screening and a colonoscopy to be performed for colon cancer screening purposes.  The risks of the procedure including infection, bleed, or perforation as well as benefits, limitations, alternatives and imponderables have been reviewed with the patient. Questions have been answered. All parties agreeable.

## 2023-02-06 NOTE — Anesthesia Preprocedure Evaluation (Signed)
Anesthesia Evaluation  Patient identified by MRN, date of birth, ID band Patient awake    Reviewed: Allergy & Precautions, H&P , NPO status , Patient's Chart, lab work & pertinent test results, reviewed documented beta blocker date and time   Airway Mallampati: II  TM Distance: >3 FB Neck ROM: full    Dental no notable dental hx.    Pulmonary neg pulmonary ROS, sleep apnea , former smoker   Pulmonary exam normal breath sounds clear to auscultation       Cardiovascular Exercise Tolerance: Good hypertension, + angina  + CAD, + Past MI, + CABG, + Peripheral Vascular Disease and +CHF  + dysrhythmias + pacemaker + Cardiac Defibrillator  Rhythm:regular Rate:Normal     Neuro/Psych  PSYCHIATRIC DISORDERS Anxiety Depression     Neuromuscular disease negative neurological ROS  negative psych ROS   GI/Hepatic negative GI ROS, PUD,GERD  ,,(+) Cirrhosis         Endo/Other  negative endocrine ROSdiabetes    Renal/GU negative Renal ROS  negative genitourinary   Musculoskeletal   Abdominal   Peds  Hematology negative hematology ROS (+) Blood dyscrasia, anemia   Anesthesia Other Findings   Reproductive/Obstetrics negative OB ROS                             Anesthesia Physical Anesthesia Plan  ASA: 3  Anesthesia Plan: General   Post-op Pain Management:    Induction:   PONV Risk Score and Plan: Propofol infusion  Airway Management Planned:   Additional Equipment:   Intra-op Plan:   Post-operative Plan:   Informed Consent: I have reviewed the patients History and Physical, chart, labs and discussed the procedure including the risks, benefits and alternatives for the proposed anesthesia with the patient or authorized representative who has indicated his/her understanding and acceptance.     Dental Advisory Given  Plan Discussed with: CRNA  Anesthesia Plan Comments:         Anesthesia Quick Evaluation

## 2023-02-06 NOTE — Op Note (Signed)
[facility] Patient Name: Kelly [name] Procedure Date: 02/06/2023 8:59 AM MRN: [id number] Date of Birth: 11/16/1955 Attending MD: Hennie Duos. Marletta Lor , [place], [id number] CSN: [id number] Age: 67 Admit Type: Outpatient Procedure:                Colonoscopy Indications:              Screening for colorectal malignant neoplasm Providers:                Hennie Duos. Marletta Lor, DO, Nena Polio, RN, Elinor Parkinson Referring MD:              Medicines:                See the Anesthesia note for documentation of the                            administered medications Complications:            No immediate complications. Estimated Blood Loss:     Estimated blood loss: none. Procedure:                Pre-Anesthesia Assessment:                           - The anesthesia plan was to use monitored                            anesthesia care (MAC).                           After obtaining informed consent, the colonoscope                            was passed under direct vision. Throughout the                            procedure, the patient's blood pressure, pulse, and                            oxygen saturations were monitored continuously. The                            PCF-HQ190L ([id number]) scope was introduced through                            the anus and advanced to the the cecum, identified                            by appendiceal orifice and ileocecal valve. The                            colonoscopy was performed without difficulty. The                            patient tolerated the procedure well. The quality  of the bowel preparation was evaluated using the                            BBPS [facility] Bowel Preparation Scale) with scores                            of: Right Colon = 2 (minor amount of residual                            staining, small fragments of stool and/or opaque                            liquid, but mucosa seen  well), Transverse Colon = 2                            (minor amount of residual staining, small fragments                            of stool and/or opaque liquid, but mucosa seen                            well) and Left Colon = 3 (entire mucosa seen well                            with no residual staining, small fragments of stool                            or opaque liquid). The total BBPS score equals 7.                            The quality of the bowel preparation was good. Scope In: 9:24:04 AM Scope Out: 9:41:36 AM Scope Withdrawal Time: 0 hours 14 minutes 25 seconds  Total Procedure Duration: 0 hours 17 minutes 32 seconds  Findings:      Multiple medium-mouthed and small-mouthed diverticula were found in the       sigmoid colon and descending colon.      The exam was otherwise without abnormality. Impression:               - Diverticulosis in the sigmoid colon and in the                            descending colon.                           - The examination was otherwise normal.                           - No specimens collected. Moderate Sedation:      Per Anesthesia Care Recommendation:           - Patient has a contact number available for                            emergencies. The signs  and symptoms of potential                            delayed complications were discussed with the                            patient. Return to normal activities tomorrow.                            Written discharge instructions were provided to the                            patient.                           - Resume previous diet.                           - Continue present medications.                           - Repeat colonoscopy in 10 years for screening                            purposes.                           - Return to GI clinic in 6 months. Procedure Code(s):        --- Professional ---                           N0272, Colorectal cancer screening; colonoscopy on                             individual not meeting criteria for high risk Diagnosis Code(s):        --- Professional ---                           Z12.11, Encounter for screening for malignant                            neoplasm of colon                           K57.30, Diverticulosis of large intestine without                            perforation or abscess without bleeding CPT copyright 2022 American Medical Association. All rights reserved. The codes documented in this report are preliminary and upon coder review may  be revised to meet current compliance requirements. Hennie Duos. Marletta Lor, DO Hennie Duos. Marletta Lor, DO 02/06/2023 9:45:38 AM This report has been signed electronically. Number of Addenda: 0

## 2023-02-06 NOTE — Discharge Instructions (Signed)
EGD Discharge instructions Please read the instructions outlined below and refer to this sheet in the next few weeks. These discharge instructions provide you with general information on caring for yourself after you leave the hospital. Your doctor may also give you specific instructions. While your treatment has been planned according to the most current medical practices available, unavoidable complications occasionally occur. If you have any problems or questions after discharge, please call your doctor. ACTIVITY You may resume your regular activity but move at a slower pace for the next 24 hours.  Take frequent rest periods for the next 24 hours.  Walking will help expel (get rid of) the air and reduce the bloated feeling in your abdomen.  No driving for 24 hours (because of the anesthesia (medicine) used during the test).  You may shower.  Do not sign any important legal documents or operate any machinery for 24 hours (because of the anesthesia used during the test).  NUTRITION Drink plenty of fluids.  You may resume your normal diet.  Begin with a light meal and progress to your normal diet.  Avoid alcoholic beverages for 24 hours or as instructed by your caregiver.  MEDICATIONS You may resume your normal medications unless your caregiver tells you otherwise.  WHAT YOU CAN EXPECT TODAY You may experience abdominal discomfort such as a feeling of fullness or "gas" pains.  FOLLOW-UP Your doctor will discuss the results of your test with you.  SEEK IMMEDIATE MEDICAL ATTENTION IF ANY OF THE FOLLOWING OCCUR: Excessive nausea (feeling sick to your stomach) and/or vomiting.  Severe abdominal pain and distention (swelling).  Trouble swallowing.  Temperature over 101 F (37.8 C).  Rectal bleeding or vomiting of blood.      Colonoscopy Discharge Instructions  Read the instructions outlined below and refer to this sheet in the next few weeks. These discharge instructions provide you  with general information on caring for yourself after you leave the hospital. Your doctor may also give you specific instructions. While your treatment has been planned according to the most current medical practices available, unavoidable complications occasionally occur.   ACTIVITY You may resume your regular activity, but move at a slower pace for the next 24 hours.  Take frequent rest periods for the next 24 hours.  Walking will help get rid of the air and reduce the bloated feeling in your belly (abdomen).  No driving for 24 hours (because of the medicine (anesthesia) used during the test).   Do not sign any important legal documents or operate any machinery for 24 hours (because of the anesthesia used during the test).  NUTRITION Drink plenty of fluids.  You may resume your normal diet as instructed by your doctor.  Begin with a light meal and progress to your normal diet. Heavy or fried foods are harder to digest and may make you feel sick to your stomach (nauseated).  Avoid alcoholic beverages for 24 hours or as instructed.  MEDICATIONS You may resume your normal medications unless your doctor tells you otherwise.  WHAT YOU CAN EXPECT TODAY Some feelings of bloating in the abdomen.  Passage of more gas than usual.  Spotting of blood in your stool or on the toilet paper.  IF YOU HAD POLYPS REMOVED DURING THE COLONOSCOPY: No aspirin products for 7 days or as instructed.  No alcohol for 7 days or as instructed.  Eat a soft diet for the next 24 hours.  FINDING OUT THE RESULTS OF YOUR TEST Not all test results  are available during your visit. If your test results are not back during the visit, make an appointment with your caregiver to find out the results. Do not assume everything is normal if you have not heard from your caregiver or the medical facility. It is important for you to follow up on all of your test results.  SEEK IMMEDIATE MEDICAL ATTENTION IF: You have more than a  spotting of blood in your stool.  Your belly is swollen (abdominal distention).  You are nauseated or vomiting.  You have a temperature over 101.  You have abdominal pain or discomfort that is severe or gets worse throughout the day.   Overall your upper endoscopy was unremarkable.  I did not see any evidence of esophageal varices.  Previous gastric bypass surgery looked healthy.  Previously noted ulcer has healed.  Your colonoscopy was relatively unremarkable.  I did not find any polyps or evidence of colon cancer.  I recommend repeating colonoscopy in 10 years for colon cancer screening purposes.  You do have diverticulosis and internal hemorrhoids. I would recommend increasing fiber in your diet or adding OTC Benefiber/Metamucil. Be sure to drink at least 4 to 6 glasses of water daily. Follow-up with GI in 6 months   I hope you have a great rest of your week!  Hennie Duos. Marletta Lor, D.O. Gastroenterology and Hepatology [facility]

## 2023-02-06 NOTE — Op Note (Signed)
[facility] Patient Name: Kelly [name] Procedure Date: 02/06/2023 8:59 AM MRN: [id number] Date of Birth: [birth year] Attending MD: Hennie Duos. Marletta Lor , [place], [id number] CSN: [id number] Age: 67 Admit Type: Outpatient Procedure:                Upper GI endoscopy Indications:              Cirrhosis rule out esophageal varices Providers:                Hennie Duos. Marletta Lor, DO, Nena Polio, RN, Elinor Parkinson Referring MD:              Medicines:                See the Anesthesia note for documentation of the                            administered medications Complications:            No immediate complications. Estimated Blood Loss:     Estimated blood loss: none. Procedure:                Pre-Anesthesia Assessment:                           - The anesthesia plan was to use monitored                            anesthesia care (MAC).                           After obtaining informed consent, the endoscope was                            passed under direct vision. Throughout the                            procedure, the patient's blood pressure, pulse, and                            oxygen saturations were monitored continuously. The                            GIF-H190 ([id number]) scope was introduced through the                            mouth, and advanced to the second part of duodenum.                            The upper GI endoscopy was accomplished without                            difficulty. The patient tolerated the procedure                            well. Scope In: 9:14:49  AM Scope Out: 9:17:17 AM Total Procedure Duration: 0 hours 2 minutes 28 seconds  Findings:      There is no endoscopic evidence of varices in the entire esophagus.      The Z-line was regular and was found 40 cm from the incisors.      Evidence of a Roux-en-Y gastrojejunostomy was found. The gastrojejunal       anastomosis was characterized by healthy appearing mucosa. This  was       traversed. The duodenum-to-jejunum limb was not examined as it could not       be reached.      There is no endoscopic evidence of varices in the entire examined       stomach.      The examined jejunum was normal. Impression:               - Z-line regular, 40 cm from the incisors.                           - Roux-en-Y gastrojejunostomy with gastrojejunal                            anastomosis characterized by healthy appearing                            mucosa.                           - Normal examined jejunum.                           - No specimens collected. Moderate Sedation:      Per Anesthesia Care Recommendation:           - Patient has a contact number available for                            emergencies. The signs and symptoms of potential                            delayed complications were discussed with the                            patient. Return to normal activities tomorrow.                            Written discharge instructions were provided to the                            patient.                           - Resume previous diet.                           - Continue present medications.                           - Repeat upper endoscopy in 3 years for screening  purposes.                           - Return to GI clinic in 6 months. Procedure Code(s):        --- Professional ---                           [phone], Esophagogastroduodenoscopy, flexible,                            transoral; diagnostic, including collection of                            specimen(s) by brushing or washing, when performed                            (separate procedure) Diagnosis Code(s):        --- Professional ---                           Z98.0, Intestinal bypass and anastomosis status                           K74.60, Unspecified cirrhosis of liver CPT copyright 2022 American Medical Association. All rights reserved. The codes documented in  this report are preliminary and upon coder review may  be revised to meet current compliance requirements. Hennie Duos. Marletta Lor, DO Hennie Duos. Marletta Lor, DO 02/06/2023 9:21:48 AM This report has been signed electronically. Number of Addenda: 0

## 2023-02-06 NOTE — Transfer of Care (Addendum)
Immediate Anesthesia Transfer of Care Note  Patient: Kelly [name]  Procedure(s) Performed: COLONOSCOPY WITH PROPOFOL ESOPHAGOGASTRODUODENOSCOPY (EGD) WITH PROPOFOL  Patient Location: Short Stay  Anesthesia Type:General  Level of Consciousness: drowsy and patient cooperative  Airway & Oxygen Therapy: Patient Spontanous Breathing  Post-op Assessment: Report given to RN and Post -op Vital signs reviewed and stable  Post vital signs: Reviewed and stable  Last Vitals:  Vitals Value Taken Time  BP 122/53 02/06/23   0949  Temp 36.6 02/06/23   0949  Pulse 64 02/06/23   0949  Resp 15 02/06/23   0949  SpO2 99% 02/06/23   0949    Last Pain:  Vitals:   02/06/23 0653  TempSrc: Oral  PainSc: 0-No pain         Complications: No notable events documented.

## 2023-02-07 NOTE — Anesthesia Postprocedure Evaluation (Signed)
Anesthesia Post Note  Patient: Kelly [name]  Procedure(s) Performed: COLONOSCOPY WITH PROPOFOL ESOPHAGOGASTRODUODENOSCOPY (EGD) WITH PROPOFOL  Patient location during evaluation: Phase II Anesthesia Type: General Level of consciousness: awake Pain management: pain level controlled Vital Signs Assessment: post-procedure vital signs reviewed and stable Respiratory status: spontaneous breathing and respiratory function stable Cardiovascular status: blood pressure returned to baseline and stable Postop Assessment: no headache and no apparent nausea or vomiting Anesthetic complications: no Comments: Late entry   No notable events documented.   Last Vitals:  Vitals:   02/06/23 0653 02/06/23 0949  BP: 114/60 (!) 122/53  Pulse: 65 64  Resp: 15 15  Temp: 36.4 C 36.6 C  SpO2: 97% 99%    Last Pain:  Vitals:   02/06/23 0949  TempSrc: Oral  PainSc: Asleep                 Windell Norfolk

## 2023-02-12 ENCOUNTER — Ambulatory Visit: Payer: Medicare PPO | Attending: Cardiology | Admitting: *Deleted

## 2023-02-12 DIAGNOSIS — Z5181 Encounter for therapeutic drug level monitoring: Secondary | ICD-10-CM | POA: Diagnosis not present

## 2023-02-12 DIAGNOSIS — D6869 Other thrombophilia: Secondary | ICD-10-CM | POA: Diagnosis not present

## 2023-02-12 DIAGNOSIS — I480 Paroxysmal atrial fibrillation: Secondary | ICD-10-CM

## 2023-02-12 LAB — POCT INR: INR: 2 (ref 2.0–3.0)

## 2023-02-12 NOTE — Patient Instructions (Signed)
Continue warfarin 1 tablet daily except 1 1/2 tablets on Sundays and Wednesdays Recheck in 3 wks

## 2023-02-16 ENCOUNTER — Encounter (HOSPITAL_COMMUNITY): Payer: Self-pay | Admitting: Internal Medicine

## 2023-02-16 LAB — CUP PACEART REMOTE DEVICE CHECK
Battery Remaining Longevity: 60 mo
Battery Remaining Percentage: 91 %
Battery Voltage: 3.13 V
Brady Statistic AP VP Percent: 64 %
Brady Statistic AP VS Percent: 1 %
Brady Statistic AS VP Percent: 35 %
Brady Statistic AS VS Percent: 1 %
Brady Statistic RA Percent Paced: 63 %
Date Time Interrogation Session: 20240930020018
HighPow Impedance: 59 Ohm
HighPow Impedance: 59 Ohm
Implantable Lead Connection Status: 753985
Implantable Lead Connection Status: 753985
Implantable Lead Connection Status: 753985
Implantable Lead Implant Date: 20211025
Implantable Lead Implant Date: 20240628
Implantable Lead Implant Date: 20240628
Implantable Lead Location: 753859
Implantable Lead Location: 753860
Implantable Lead Location: 753860
Implantable Pulse Generator Implant Date: 20240628
Lead Channel Impedance Value: 450 Ohm
Lead Channel Impedance Value: 480 Ohm
Lead Channel Impedance Value: 590 Ohm
Lead Channel Pacing Threshold Amplitude: 0.75 V
Lead Channel Pacing Threshold Amplitude: 1 V
Lead Channel Pacing Threshold Amplitude: 2.5 V
Lead Channel Pacing Threshold Pulse Width: 0.5 ms
Lead Channel Pacing Threshold Pulse Width: 0.5 ms
Lead Channel Pacing Threshold Pulse Width: 1 ms
Lead Channel Sensing Intrinsic Amplitude: 1.6 mV
Lead Channel Sensing Intrinsic Amplitude: 12 mV
Lead Channel Setting Pacing Amplitude: 0.25 V
Lead Channel Setting Pacing Amplitude: 2 V
Lead Channel Setting Pacing Amplitude: 3 V
Lead Channel Setting Pacing Pulse Width: 0.05 ms
Lead Channel Setting Pacing Pulse Width: 0.5 ms
Lead Channel Setting Sensing Sensitivity: 0.5 mV
Pulse Gen Serial Number: 5563291
Zone Setting Status: 755011

## 2023-02-18 ENCOUNTER — Other Ambulatory Visit: Payer: Self-pay | Admitting: Cardiology

## 2023-02-18 NOTE — Telephone Encounter (Signed)
Refill request for warfarin:  Last INR was 2.0 on 02/12/23 Next INR due 03/12/23 LOV was 02/03/23  Refill approved.

## 2023-02-20 ENCOUNTER — Encounter: Payer: Medicare PPO | Admitting: Cardiology

## 2023-03-06 ENCOUNTER — Ambulatory Visit (HOSPITAL_COMMUNITY)
Admission: RE | Admit: 2023-03-06 | Discharge: 2023-03-06 | Disposition: A | Discharge status: Home / Self Care | Payer: Medicare PPO | Source: Ambulatory Visit | Attending: Physician Assistant | Admitting: Physician Assistant

## 2023-03-06 DIAGNOSIS — I5022 Chronic systolic (congestive) heart failure: Secondary | ICD-10-CM | POA: Diagnosis not present

## 2023-03-06 DIAGNOSIS — I350 Nonrheumatic aortic (valve) stenosis: Secondary | ICD-10-CM | POA: Insufficient documentation

## 2023-03-06 LAB — ECHOCARDIOGRAM LIMITED
AR max vel: 1.18 cm2
AV Area VTI: 1.1 cm2
AV Area mean vel: 1.18 cm2
AV Mean grad: 22 mm[Hg]
AV Peak grad: 36.6 mm[Hg]
Ao pk vel: 3.03 m/s
Calc EF: 46.6 %
S' Lateral: 4.2 cm
Single Plane A2C EF: 46.2 %
Single Plane A4C EF: 47.7 %

## 2023-03-06 NOTE — Progress Notes (Signed)
*  PRELIMINARY RESULTS* Echocardiogram Limited 2-D Echocardiogram  has been performed.  Stacey Drain 03/06/2023, 12:05 PM

## 2023-03-10 ENCOUNTER — Ambulatory Visit: Payer: BC Managed Care – PPO

## 2023-03-12 ENCOUNTER — Ambulatory Visit: Payer: Medicare PPO | Attending: Cardiology | Admitting: *Deleted

## 2023-03-12 DIAGNOSIS — I480 Paroxysmal atrial fibrillation: Secondary | ICD-10-CM | POA: Diagnosis not present

## 2023-03-12 DIAGNOSIS — D6869 Other thrombophilia: Secondary | ICD-10-CM | POA: Diagnosis not present

## 2023-03-12 DIAGNOSIS — Z5181 Encounter for therapeutic drug level monitoring: Secondary | ICD-10-CM | POA: Diagnosis not present

## 2023-03-12 LAB — POCT INR: INR: 1.8 — AB (ref 2.0–3.0)

## 2023-03-12 NOTE — Patient Instructions (Signed)
Take warfarin 1 1/2 tablets tonight then increase dose to 1 tablet daily except 1 1/2 tablets on Mondays, Wednesdays and Fridays Recheck in 3 wks

## 2023-03-26 ENCOUNTER — Ambulatory Visit: Payer: Medicare PPO | Attending: Student | Admitting: Student

## 2023-03-26 ENCOUNTER — Encounter: Payer: Self-pay | Admitting: Student

## 2023-03-26 VITALS — BP 104/56 | HR 62 | Ht 76.0 in | Wt 223.8 lb

## 2023-03-26 DIAGNOSIS — Z95810 Presence of automatic (implantable) cardiac defibrillator: Secondary | ICD-10-CM | POA: Diagnosis not present

## 2023-03-26 DIAGNOSIS — I2510 Atherosclerotic heart disease of native coronary artery without angina pectoris: Secondary | ICD-10-CM

## 2023-03-26 DIAGNOSIS — I480 Paroxysmal atrial fibrillation: Secondary | ICD-10-CM | POA: Diagnosis not present

## 2023-03-26 DIAGNOSIS — R42 Dizziness and giddiness: Secondary | ICD-10-CM

## 2023-03-26 DIAGNOSIS — I5032 Chronic diastolic (congestive) heart failure: Secondary | ICD-10-CM

## 2023-03-26 DIAGNOSIS — I350 Nonrheumatic aortic (valve) stenosis: Secondary | ICD-10-CM | POA: Diagnosis not present

## 2023-03-26 MED ORDER — MECLIZINE HCL 25 MG PO TABS: 25.0000 mg | ORAL_TABLET | Freq: Three times a day (TID) | ORAL | 0 refills | Status: DC | PRN

## 2023-03-26 MED ORDER — ISOSORBIDE MONONITRATE ER 30 MG PO TB24: 15.0000 mg | ORAL_TABLET | Freq: Every day | ORAL | 3 refills | Status: AC

## 2023-03-26 NOTE — Progress Notes (Signed)
Cardiology Office Note    Date:  03/26/2023  ID:  Kelly [name], DOB Jan 18, 1956, MRN [id number] Cardiologist: Nona Dell, MD   EP: Dr. Lalla Brothers  History of Present Illness:    Kelly [name] is a 67 y.o. male with past medical history of CAD (s/p CABG in 2018 with cath in 06/2020 showing patent LIMA-LAD, patent SVG-OM2 and SVG-rPDA with native disease not amenable to PCI), aortic stenosis, symptomatic bradycardia (s/p PPM placement with RV lead extraction and upgrade to CRT-D in 10/2022), chronic HFrEF (EF 30-35% in 10/2022), paroxysmal atrial fibrillation, recurrent DVT's, HTN, HLD, Type 2 DM, cirrhosis, and GERD who presents to the office today for 6-week follow-up.  He was last examined by Ronie Spies, PA in 01/2023 and reported overall doing well and had only utilized nitroglycerin x 1 within the past month. He had NYHA class II dyspnea but no acute progression of symptoms. Given that his device had been upgraded to CRT-D, a follow-up limited echocardiogram was recommended to reassess his EF and also reassess his aortic stenosis as this had been moderate to severe by prior imaging. He had been on Simvastatin and follow-up labs were obtained and this was switched to Crestor 20 mg daily with plans for follow-up FLP and CMET in 2 months. He did undergo EGD and colonoscopy in 01/2023 with diverticulosis noted but otherwise no significant abnormalities. Limited echocardiogram in 02/2023 showed that his EF had improved to 45 to 50% and aortic stenosis was overall in a moderate range with mean gradient of 22 mmHg. This was reviewed with Dr. Diona Browner who recommended continued medical therapy at that time as long as he was clinically stable.  In talking with the patient today, his biggest issue at this time is dizziness. Reports this occurs with movement or while standing still. It is worse when bending down to pick up objects or looking toward the ceiling. He has not checked his blood pressure when  this occurs. Denies any associated syncope. No recent chest pain or palpitations. Reports his breathing has improved over the past few months with no recent orthopnea or PND. He does have chronic edema along his right lower extremity but no acute changes in this. Says that he remains hydrated and consumes over a gallon of water a day.  Studies Reviewed:   EKG: EKG is not ordered today.  Cardiac Catheterization: 06/2020 1.  Severe native three-vessel coronary artery disease with severe stenosis of the proximal LAD and associated heavy calcification, chronic total occlusion of the ostial RCA, and moderately severe left circumflex stenosis 2.  Status post aortocoronary bypass surgery with continued patency of the LIMA to LAD graft, saphenous vein graft to second obtuse marginal, and saphenous vein graft to right PDA. 3.  Normal LVEDP   Recommendations: The patient has patency of all bypass conduits now 4 years out from coronary bypass surgery.  He has distal vessel disease, not amenable to PCI.  Recommend ongoing medical therapy/increase antianginal program.  Will add low-dose isosorbide as tolerated.  Limited Echo: 02/2023 IMPRESSIONS     1. Left ventricular ejection fraction, by estimation, is 45 to 50%. The  left ventricle has mildly decreased function. There is mild left  ventricular hypertrophy.   2. Right ventricular systolic function is normal. The right ventricular  size is normal.   3. The aortic valve is tricuspid. There is moderate calcification of the  aortic valve. There is moderate thickening of the aortic valve. Aortic  valve regurgitation is not visualized.  Moderate aortic valve stenosis.  Aortic valve mean gradient measures  22.0 mmHg. Aortic valve peak gradient measures 36.6 mmHg. Aortic valve  area, by VTI measures 1.10 cm. DI 0.27     4. The inferior vena cava is normal in size with greater than 50%  respiratory variability, suggesting right atrial pressure of 3 mmHg.     Risk Assessment/Calculations:    CHA2DS2-VASc Score = 5   This indicates a 7.2% annual risk of stroke. The patient's score is based upon: CHF History: 1 HTN History: 1 Diabetes History: 1 Stroke History: 0 Vascular Disease History: 1 Age Score: 1 Gender Score: 0    Physical Exam:   VS:  BP (!) 104/56   Pulse 62   Ht 6\' 4"  (1.93 m)   Wt 223 lb 12.8 oz (101.5 kg)   SpO2 96%   BMI 27.24 kg/m    Wt Readings from Last 3 Encounters:  03/26/23 223 lb 12.8 oz (101.5 kg)  02/06/23 218 lb 14.7 oz (99.3 kg)  02/04/23 219 lb (99.3 kg)     GEN: Well nourished, well developed male appearing in no acute distress NECK: No JVD; No carotid bruits CARDIAC: RRR, 2/6 SEM along RUSB.  RESPIRATORY:  Clear to auscultation without rales, wheezing or rhonchi  ABDOMEN: Appears non-distended. No obvious abdominal masses. EXTREMITIES: No clubbing or cyanosis. 1+ pitting edema along RLE and varicose veins present. No edema along LLE.  Distal pedal pulses are 2+ bilaterally.   Assessment and Plan:   1. Chronic HFimpEF - His ejection fraction was previously at 30 to 35% in 10/2022 but had improved to 45 to 50% by recent imaging in 02/2023. He reports his breathing has significantly improved and only has isolated edema along his right lower extremity which has been chronic and he does utilize compression stockings with improvement. - At this time, would continue current medical therapy with Entresto 24-26 mg twice daily. Unable to add a beta-blocker or Spironlactone due to soft BP (at 104/56 today). Also did not add an SGLT2 inhibitor given dizziness as well. Could consider in the future if this improves.  2. CAD - He underwent CABG in 2018 with cath in 06/2020 showing patent LIMA-LAD, patent SVG-OM2 and SVG-rPDA with native disease not amenable to PCI and medical management recommended.  - He denies any recent anginal symptoms. He has remained on ASA 81 mg daily and is also on Crestor 20 mg  daily. He was switched from Simvastatin to Crestor in 01/2023 we reviewed the need for repeat labs including FLP and CMET (orders previously entered). Given his soft BP and no recent chest pain, will reduce Imdur from 30 mg daily to 15 mg daily.  3. Aortic Stenosis - This was moderate by echocardiogram in 02/2023 with mean gradient of 22 mmHg and peak gradient 36.6 mmHg. Previously mild by TEE in 10/2022 (had been read as moderate to severe by TTE that month).  - Will continue to follow for now given no recent symptoms. Anticipate a repeat echocardiogram in 6 to 12 months for reassessment. We reviewed warning signs to monitor for.  4. Symptomatic Bradycardia - He did have a PPM in place and underwent upgrade to CRT-D in 10/2022. This is followed by Dr. Lalla Brothers. Most recent interrogation in 01/2023 showed his device was functioning normally.  5. Paroxysmal Atrial Fibrillation/Long-term Anticoagulation - He has a history of atrial fibrillation and most recent device interrogation showed his longest duration was 6 hours with an overall burden of less  than 1%. He is on Coumadin for anticoagulation which is followed by Vashti Hey and INR was at 1.8 on 03/12/2023 with dosing adjusted at that time.  - He has not required the use of AV nodal blocking agents for rate control.  6. HTN/Dizziness - BP was at 104/56 on initial check today with similar values by recheck. Checked orthostatics and negative. His dizziness is possibly due to BPPV but could also be due to his soft BP as numbers typically would run higher than this prior to the initiation of Entresto. Reviewed options with the patient will reduce Imdur from 30 mg daily to 15 mg daily. Will also provide with Rx for PRN Meclizine to see if this helps with symptoms. If improvement with Meclizine, would recommend referral to ENT.   Signed, Ellsworth Lennox, PA-C

## 2023-03-26 NOTE — Patient Instructions (Signed)
Medication Instructions:   Decrease Imdur to 15 mg Daily  Start Meclizine 25 mg up to 3 times daily   *If you need a refill on your cardiac medications before your next appointment, please call your pharmacy*   Lab Work: NONE   If you have labs (blood work) drawn today and your tests are completely normal, you will receive your results only by: MyChart Message (if you have MyChart) OR A paper copy in the mail If you have any lab test that is abnormal or we need to change your treatment, we will call you to review the results.   Testing/Procedures: NONE    Follow-Up: At [facility], you and your health needs are our priority.  As part of our continuing mission to provide you with exceptional heart care, we have created designated Provider Care Teams.  These Care Teams include your primary Cardiologist (physician) and Advanced Practice Providers (APPs -  Physician Assistants and Nurse Practitioners) who all work together to provide you with the care you need, when you need it.  We recommend signing up for the patient portal called "MyChart".  Sign up information is provided on this After Visit Summary.  MyChart is used to connect with patients for Virtual Visits (Telemedicine).  Patients are able to view lab/test results, encounter notes, upcoming appointments, etc.  Non-urgent messages can be sent to your provider as well.   To learn more about what you can do with MyChart, go to [email or web address].    Your next appointment:   3 -4 month(s)  Provider:   You may see Nona Dell, MD or one of the following Advanced Practice Providers on your designated Care Team:   Randall An, PA-C  Jacolyn Reedy, PA-C     Other Instructions Thank you for choosing [facility]!

## 2023-03-31 ENCOUNTER — Ambulatory Visit: Payer: Medicare PPO | Admitting: Student

## 2023-04-02 ENCOUNTER — Ambulatory Visit: Payer: Medicare PPO | Attending: Cardiology | Admitting: *Deleted

## 2023-04-02 DIAGNOSIS — D6869 Other thrombophilia: Secondary | ICD-10-CM | POA: Diagnosis not present

## 2023-04-02 DIAGNOSIS — Z5181 Encounter for therapeutic drug level monitoring: Secondary | ICD-10-CM

## 2023-04-02 DIAGNOSIS — I480 Paroxysmal atrial fibrillation: Secondary | ICD-10-CM

## 2023-04-02 LAB — POCT INR: INR: 2 (ref 2.0–3.0)

## 2023-04-02 NOTE — Patient Instructions (Signed)
Continue warfarin 1 tablet daily except 1 1/2 tablets on Mondays, Wednesdays and Fridays  Recheck in 4 wks 

## 2023-04-15 ENCOUNTER — Ambulatory Visit: Payer: Medicare PPO | Admitting: Orthopedic Surgery

## 2023-04-19 ENCOUNTER — Other Ambulatory Visit: Payer: Self-pay | Admitting: Cardiology

## 2023-05-04 ENCOUNTER — Ambulatory Visit: Payer: Medicare PPO | Attending: Cardiology | Admitting: *Deleted

## 2023-05-04 DIAGNOSIS — D6869 Other thrombophilia: Secondary | ICD-10-CM | POA: Diagnosis not present

## 2023-05-04 DIAGNOSIS — Z5181 Encounter for therapeutic drug level monitoring: Secondary | ICD-10-CM | POA: Diagnosis not present

## 2023-05-04 DIAGNOSIS — I480 Paroxysmal atrial fibrillation: Secondary | ICD-10-CM | POA: Diagnosis not present

## 2023-05-04 LAB — POCT INR: INR: 1.8 — AB (ref 2.0–3.0)

## 2023-05-04 NOTE — Patient Instructions (Signed)
Increase warfarin to 1 1/2 tablets daily except 1 tablet on Sundays and Thursdays Recheck in 4 wks

## 2023-05-15 DIAGNOSIS — E785 Hyperlipidemia, unspecified: Secondary | ICD-10-CM | POA: Diagnosis not present

## 2023-05-15 DIAGNOSIS — E1140 Type 2 diabetes mellitus with diabetic neuropathy, unspecified: Secondary | ICD-10-CM | POA: Diagnosis not present

## 2023-05-18 ENCOUNTER — Ambulatory Visit (INDEPENDENT_AMBULATORY_CARE_PROVIDER_SITE_OTHER): Payer: Medicare PPO

## 2023-05-18 DIAGNOSIS — I442 Atrioventricular block, complete: Secondary | ICD-10-CM | POA: Diagnosis not present

## 2023-05-19 LAB — CUP PACEART REMOTE DEVICE CHECK
Battery Remaining Longevity: 58 mo
Battery Remaining Percentage: 87 %
Battery Voltage: 3.07 V
Brady Statistic AP VP Percent: 60 %
Brady Statistic AP VS Percent: 1 %
Brady Statistic AS VP Percent: 39 %
Brady Statistic AS VS Percent: 1 %
Brady Statistic RA Percent Paced: 59 %
Date Time Interrogation Session: 20241230020021
HighPow Impedance: 57 Ohm
HighPow Impedance: 57 Ohm
Implantable Lead Connection Status: 753985
Implantable Lead Connection Status: 753985
Implantable Lead Connection Status: 753985
Implantable Lead Implant Date: 20211025
Implantable Lead Implant Date: 20240628
Implantable Lead Implant Date: 20240628
Implantable Lead Location: 753859
Implantable Lead Location: 753860
Implantable Lead Location: 753860
Implantable Pulse Generator Implant Date: 20240628
Lead Channel Impedance Value: 410 Ohm
Lead Channel Impedance Value: 490 Ohm
Lead Channel Impedance Value: 550 Ohm
Lead Channel Pacing Threshold Amplitude: 0.625 V
Lead Channel Pacing Threshold Amplitude: 1 V
Lead Channel Pacing Threshold Amplitude: 2.5 V
Lead Channel Pacing Threshold Pulse Width: 0.5 ms
Lead Channel Pacing Threshold Pulse Width: 0.5 ms
Lead Channel Pacing Threshold Pulse Width: 1 ms
Lead Channel Sensing Intrinsic Amplitude: 1.8 mV
Lead Channel Sensing Intrinsic Amplitude: 12 mV
Lead Channel Setting Pacing Amplitude: 0.25 V
Lead Channel Setting Pacing Amplitude: 2 V
Lead Channel Setting Pacing Amplitude: 3 V
Lead Channel Setting Pacing Pulse Width: 0.05 ms
Lead Channel Setting Pacing Pulse Width: 0.5 ms
Lead Channel Setting Sensing Sensitivity: 0.5 mV
Pulse Gen Serial Number: 5563291
Zone Setting Status: 755011

## 2023-05-22 ENCOUNTER — Encounter: Payer: Self-pay | Admitting: Family Medicine

## 2023-05-22 DIAGNOSIS — R202 Paresthesia of skin: Secondary | ICD-10-CM | POA: Diagnosis not present

## 2023-05-22 DIAGNOSIS — E1169 Type 2 diabetes mellitus with other specified complication: Secondary | ICD-10-CM | POA: Diagnosis not present

## 2023-05-22 DIAGNOSIS — Z950 Presence of cardiac pacemaker: Secondary | ICD-10-CM | POA: Diagnosis not present

## 2023-05-22 DIAGNOSIS — E1165 Type 2 diabetes mellitus with hyperglycemia: Secondary | ICD-10-CM | POA: Diagnosis not present

## 2023-05-22 DIAGNOSIS — F331 Major depressive disorder, recurrent, moderate: Secondary | ICD-10-CM | POA: Diagnosis not present

## 2023-05-22 DIAGNOSIS — K219 Gastro-esophageal reflux disease without esophagitis: Secondary | ICD-10-CM | POA: Diagnosis not present

## 2023-05-22 DIAGNOSIS — I5022 Chronic systolic (congestive) heart failure: Secondary | ICD-10-CM | POA: Diagnosis not present

## 2023-05-22 DIAGNOSIS — G894 Chronic pain syndrome: Secondary | ICD-10-CM | POA: Diagnosis not present

## 2023-05-22 DIAGNOSIS — I2510 Atherosclerotic heart disease of native coronary artery without angina pectoris: Secondary | ICD-10-CM | POA: Diagnosis not present

## 2023-06-01 ENCOUNTER — Ambulatory Visit: Payer: Medicare PPO | Attending: Cardiology | Admitting: *Deleted

## 2023-06-01 DIAGNOSIS — Z5181 Encounter for therapeutic drug level monitoring: Secondary | ICD-10-CM

## 2023-06-01 DIAGNOSIS — D6869 Other thrombophilia: Secondary | ICD-10-CM

## 2023-06-01 DIAGNOSIS — I480 Paroxysmal atrial fibrillation: Secondary | ICD-10-CM | POA: Diagnosis not present

## 2023-06-01 LAB — POCT INR: INR: 2.4 (ref 2.0–3.0)

## 2023-06-01 NOTE — Patient Instructions (Signed)
 Continue warfarin 1 1/2 tablets daily except 1 tablet on Sundays and Thursdays Recheck in 4 wks

## 2023-06-09 ENCOUNTER — Ambulatory Visit: Payer: BC Managed Care – PPO

## 2023-06-29 ENCOUNTER — Ambulatory Visit: Payer: Medicare PPO | Attending: Cardiology | Admitting: *Deleted

## 2023-06-29 DIAGNOSIS — D6869 Other thrombophilia: Secondary | ICD-10-CM | POA: Diagnosis not present

## 2023-06-29 DIAGNOSIS — Z5181 Encounter for therapeutic drug level monitoring: Secondary | ICD-10-CM

## 2023-06-29 DIAGNOSIS — I480 Paroxysmal atrial fibrillation: Secondary | ICD-10-CM | POA: Diagnosis not present

## 2023-06-29 LAB — POCT INR: INR: 3.3 — AB (ref 2.0–3.0)

## 2023-06-29 NOTE — Patient Instructions (Signed)
 Hold warfarin tonight then resume 1 1/2 tablets daily except 1 tablet on Sundays and Thursdays Recheck in 4 wks Been taking cold meds and Alka seltzer Cold

## 2023-07-14 ENCOUNTER — Encounter: Payer: Self-pay | Admitting: Cardiology

## 2023-07-14 ENCOUNTER — Ambulatory Visit: Payer: Medicare PPO | Attending: Cardiology | Admitting: Cardiology

## 2023-07-14 VITALS — BP 108/64 | HR 83 | Ht 76.0 in | Wt 218.0 lb

## 2023-07-14 DIAGNOSIS — I480 Paroxysmal atrial fibrillation: Secondary | ICD-10-CM

## 2023-07-14 DIAGNOSIS — I5020 Unspecified systolic (congestive) heart failure: Secondary | ICD-10-CM

## 2023-07-14 DIAGNOSIS — I350 Nonrheumatic aortic (valve) stenosis: Secondary | ICD-10-CM

## 2023-07-14 DIAGNOSIS — Z95810 Presence of automatic (implantable) cardiac defibrillator: Secondary | ICD-10-CM

## 2023-07-14 NOTE — Progress Notes (Signed)
 Cardiology Office Note  Date: 07/14/2023   [name], DOB 1955/11/19, MRN [id number]  History of Present Illness: Kelly [name] is a 68 y.o. male last seen by Ms. Dunn PA-C in September 2024, I reviewed the note.  He is here today with his wife for a follow-up visit.  States that he has been feeling better overall, NYHA class II dyspnea, no exertional chest pain, no palpitations or syncope.  His weight is down about 5 pounds.  St. Jude CRT-D in place with follow-up by Dr. Lalla Brothers.  Device check in December 2024 indicated normal function.  He does not report any device shocks or syncope.  I reviewed his medications.  He continues on aspirin, Imdur, Crestor, and Entresto.  Also started on Farxiga 10 mg daily by Dr. Margo Aye in the interim.  He is tolerating this well.  I reviewed his lab work, LDL was 49 in December 2024.  He remains on Coumadin with follow-up in the anticoagulation clinic.  Physical Exam: VS:  BP 108/64   Pulse 83   Ht 6\' 4"  (1.93 m)   Wt 218 lb (98.9 kg)   SpO2 98%   BMI 26.54 kg/m , BMI Body mass index is 26.54 kg/m.  Wt Readings from Last 3 Encounters:  07/14/23 218 lb (98.9 kg)  03/26/23 223 lb 12.8 oz (101.5 kg)  02/06/23 218 lb 14.7 oz (99.3 kg)    General: Patient appears comfortable at rest. HEENT: Conjunctiva and lids normal. Neck: Supple, no elevated JVP or carotid bruits. Lungs: Clear to auscultation, nonlabored breathing at rest. Cardiac: Regular rate and rhythm, no S3 or significant systolic murmur. Extremities: Trace right ankle edema..  ECG:  An ECG dated 12/22/2022 was personally reviewed today and demonstrated:  Dual-chamber pacing.  Labwork: 02/03/2023: ALT 35; AST 42; BUN 14; Creatinine, Ser 0.86; Hemoglobin 12.7; Platelets 124; Potassium 4.5; Sodium 138  December 2024: Hemoglobin 12.7, platelets 114, BUN 14, creatinine 0.99, GFR 85, potassium 4.4, AST 45, ALT 37, cholesterol 119, triglycerides 78, HDL 54, LDL 49, hemoglobin A1c  7%  Other Studies Reviewed Today:  Echocardiogram 03/06/2023:  1. Left ventricular ejection fraction, by estimation, is 45 to 50%. The  left ventricle has mildly decreased function. There is mild left  ventricular hypertrophy.   2. Right ventricular systolic function is normal. The right ventricular  size is normal.   3. The aortic valve is tricuspid. There is moderate calcification of the  aortic valve. There is moderate thickening of the aortic valve. Aortic  valve regurgitation is not visualized. Moderate aortic valve stenosis.  Aortic valve mean gradient measures  22.0 mmHg. Aortic valve peak gradient measures 36.6 mmHg. Aortic valve  area, by VTI measures 1.10 cm. DI 0.27   4. The inferior vena cava is normal in size with greater than 50%  respiratory variability, suggesting right atrial pressure of 3 mmHg.   Assessment and Plan:  1.  CAD status post BMS to the LAD and angioplasty of the RCA in 2000, BMS to the RCA/PLA in 2001, and ultimately CABG in 2018 with a LIMA to LAD, SVG to OM 2, and SVG to PDA.  He reports no angina in the interim.  Continue aspirin 81 mg daily, Crestor 20 mg daily, Imdur 15 mg daily, and as needed nitroglycerin.   2.  HFmrEF, follow-up echocardiogram in October 2024 revealed improvement in LVEF to the range of 45 to 50%, normal RV contraction.  GDMT limited by orthostatic symptoms and relatively low  blood pressure at baseline.  Currently tolerating Entresto 24/26 mg twice daily and Farxiga 10 mg daily.  No changes were made today.   3.  Moderate aortic stenosis with mean AV gradient 22 mmHg and dimensionless index 0.27 by follow-up echocardiogram in October 2024.  Plan to follow-up echocardiogram with clinical reassessment in 6 months.   4.  St. Jude CRT-D in place with recent upgrade by Dr. Lalla Brothers in June 2024.  No device shocks or syncope.   5.  Paroxysmal atrial fibrillation with CHA2DS2-VASc score of 4.  He is on Coumadin with follow-up in the  anticoagulation clinic.  No progressive palpitations.  Not on AV nodal blockers at this time.   6.  Mixed hyperlipidemia.  LDL 49 in December 2024.  Continue Crestor 20 mg daily.  Disposition:  Follow up  6 months.  Signed, Jonelle Sidle, M.D., F.A.C.C.  [facility] at [facility]

## 2023-07-14 NOTE — Patient Instructions (Addendum)
 Medication Instructions:  Your physician recommends that you continue on your current medications as directed. Please refer to the Current Medication list given to you today.  Labwork: none  Testing/Procedures: Your physician has requested that you have an echocardiogram in 6 months just before your next visit. Echocardiography is a painless test that uses sound waves to create images of your heart. It provides your doctor with information about the size and shape of your heart and how well your heart's chambers and valves are working. This procedure takes approximately one hour. There are no restrictions for this procedure. Please do NOT wear cologne, perfume, aftershave, or lotions (deodorant is allowed). Please arrive 15 minutes prior to your appointment time.  Please note: We ask at that you not bring children with you during ultrasound (echo/ vascular) testing. Due to room size and safety concerns, children are not allowed in the ultrasound rooms during exams. Our front office staff cannot provide observation of children in our lobby area while testing is being conducted. An adult accompanying a patient to their appointment will only be allowed in the ultrasound room at the discretion of the ultrasound technician under special circumstances. We apologize for any inconvenience.  Follow-Up: Your physician recommends that you schedule a follow-up appointment in: 6 months  Any Other Special Instructions Will Be Listed Below (If Applicable).  If you need a refill on your cardiac medications before your next appointment, please call your pharmacy.

## 2023-07-17 ENCOUNTER — Encounter: Payer: Self-pay | Admitting: Gastroenterology

## 2023-07-20 ENCOUNTER — Ambulatory Visit
Admission: EM | Admit: 2023-07-20 | Discharge: 2023-07-20 | Disposition: A | Discharge status: Home / Self Care | Attending: Family Medicine | Admitting: Family Medicine

## 2023-07-20 DIAGNOSIS — H9212 Otorrhea, left ear: Secondary | ICD-10-CM

## 2023-07-20 MED ORDER — NEOMYCIN-POLYMYXIN-HC 1 % OT SOLN: 3.0000 [drp] | Freq: Four times a day (QID) | OTIC | 0 refills | Status: DC

## 2023-07-20 NOTE — ED Triage Notes (Addendum)
 Pt reports bleeding in left ear, started yesterday while at church he noticed his hearing aid in the left ear stopped working, took the aid out of his ear and notcied blood on the aid wife looked at it and noticed a great amount of blood coming from the ear, they flushed the ear with a bulb syringe and peroxide, and when he woke up this morning there was still blood coming from the left ear, it has accumulated on the pillow and on the side of his face. Pt is on Coumadin.

## 2023-07-20 NOTE — Discharge Instructions (Signed)
 Follow-up as soon as possible with primary care and ear nose and throat specialist.  Use the drops and apply cotton to the outer part of the ear canal

## 2023-07-20 NOTE — ED Provider Notes (Signed)
 [facility]    CSN: [id number] Arrival date & time: 07/20/23  8657      History   Chief Complaint No chief complaint on file.   HPI Kelly [name] is a 68 y.o. male.   Pt reports bleeding in left ear, started yesterday while at church he noticed his hearing aid in the left ear stopped working, took the aid out of his ear and notcied blood on the aid wife looked at it and noticed a great amount of blood coming from the ear, they flushed the ear with a bulb syringe and peroxide, and when he woke up this morning there was still blood coming from the left ear, it has accumulated on the pillow and on the side of his face. Pt is on Coumadin.     Past Medical History:  Diagnosis Date   AICD (automatic cardioverter/defibrillator) present    Anemia    Anxiety    Aortic stenosis    Arthritis    Atrial fibrillation (HCC)    Atrial fibrillation (HCC)    CHF (congestive heart failure) (HCC)    Chronic pain    Cirrhosis (HCC)    Coronary atherosclerosis of native coronary artery    BMS LAD and PTCA RCA 2000,BMS RCA/PLA 2001, CABG June 2018   Depression    Diverticulitis    Essential hypertension    Fatty liver    Gastric ulcer    GERD (gastroesophageal reflux disease)    Hyperlipidemia    Low-grade NHL (non-Hodgkin's lymphoma) 2007   Lupus anticoagulant positive    MI (myocardial infarction) (HCC) 2001   Pacemaker    Paresthesia of upper limb    Peripheral neuropathy    Peripheral vascular disease (HCC)    blood clots in legs   Sleep apnea    Thrombocytopenia (HCC)    Trifascicular block    Type 2 diabetes mellitus (HCC)    Umbilical hernia     Patient Active Problem List   Diagnosis Date Noted   Dizziness 01/12/2023   Hematochezia 01/12/2023   Melena 01/12/2023   Constipation 12/19/2022   Black stool 12/19/2022   ICD (implantable cardioverter-defibrillator) in place 11/14/2022   Splenomegaly 08/07/2022   Muscle cramp 07/30/2021   Insomnia 07/30/2021    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   Gastroesophageal reflux disease without esophagitis 07/28/2021   Chronic pain syndrome 01/30/2021   Iron deficiency 01/30/2021   Paresthesia of upper limb 01/30/2021   Major depressive disorder 01/23/2021   Atrial fibrillation (HCC) 12/20/2020   Long term (current) use of anticoagulants 12/20/2020   Paroxysmal atrial fibrillation (HCC) 12/18/2020   Primary hypercoagulable state (HCC) 12/18/2020   Normocytic anemia 10/17/2020   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   Pacemaker 09/11/2020   Symptomatic bradycardia 02/15/2020   Umbilical hernia without obstruction and without gangrene 12/01/2018   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   Dehiscence of external surgical wound 11/26/2016   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   Coronary artery disease 11/11/2016   Accelerating angina (HCC) 09/26/2016   Thrombocytopenia (HCC) 03/24/2016   Morbid obesity due to excess calories (HCC) 11/09/2015   History of hepatitis A 06/20/2015   History of phlebitis 06/20/2015   Colon cancer screening 12/28/2014   History of lumbar laminectomy 12/18/2014   Dyspepsia, NEW ONSET 07/26/2014   Depression 06/14/2014   Hepatic cirrhosis (HCC) 05/07/2014   Obesity, Class III, BMI 40-49.9 (morbid obesity) (HCC) 06/20/2013   Low-grade NHL (non-Hodgkin's lymphoma) 05/16/2011   Lupus anticoagulant  positive 05/16/2011   Gastric ulcer 05/16/2011   Type 2 diabetes mellitus with vascular disease (HCC) 05/22/2009   Mixed hyperlipidemia 05/22/2009   Essential hypertension 05/22/2009   Coronary artery disease involving native coronary artery of native heart with angina pectoris (HCC) 05/22/2009    Past Surgical History:  Procedure Laterality Date   Axillary abscess     left incision and drainage left   BACK SURGERY     multiple   BIOPSY  08/15/2014   Procedure: BIOPSY;  Surgeon: West Bali, MD;  Location: [facility];  Service: Endoscopy;;  Gastric   BIOPSY  01/19/2015    Procedure: BIOPSY (GASTRIC ULCER);  Surgeon: West Bali, MD;  Location: [facility];  Service: Endoscopy;;   CARDIAC CATHETERIZATION     cardiac stents     x5 stents   COLONOSCOPY  June 2008   Dr. Tinnie Gens Medoff: Mild left colonic diverticulosis   COLONOSCOPY WITH PROPOFOL N/A 02/06/2023   Procedure: COLONOSCOPY WITH PROPOFOL;  Surgeon: Lanelle Bal, DO;  Location: [facility];  Service: Endoscopy;  Laterality: N/A;  8:45 am, asa 3   CORONARY ARTERY BYPASS GRAFT N/A 11/11/2016   Procedure: CORONARY ARTERY BYPASS GRAFTING (CABG) x 3, USING LEFT MAMMARY ARTERY AND RIGHT GREATER SAPHENOUS VEIN HARVESTED ENDOSCOPICALLY;  Surgeon: Kerin Perna, MD;  Location: [facility];  Service: Open Heart Surgery;  Laterality: N/A;   CORONARY PRESSURE/FFR STUDY N/A 09/26/2016   Procedure: Intravascular Pressure Wire/FFR Study;  Surgeon: Yvonne Kendall, MD;  Location: [facility];  Service: Cardiovascular;  Laterality: N/A;   Dental extractions     ESOPHAGOGASTRODUODENOSCOPY  June 2008   Dr. Tinnie Gens Medoff: Gastric ulcer, antral, biopsy with reactive changes associated glandular atrophy, no H pylori. No features of lymphoma. Likely NSAID related   ESOPHAGOGASTRODUODENOSCOPY  August 2008   Dr. Tinnie Gens Medoff: Complete healing of gastric ulcers.   ESOPHAGOGASTRODUODENOSCOPY (EGD) WITH PROPOFOL N/A 08/15/2014   SLF: 1. Abnormal pain due to ulcers & gastriitis    ESOPHAGOGASTRODUODENOSCOPY (EGD) WITH PROPOFOL N/A 01/19/2015   SLF: 1. Persistant ulcer with firm base 2. single gastric polyp found in the gastric antrum. Ulcer base    ESOPHAGOGASTRODUODENOSCOPY (EGD) WITH PROPOFOL N/A 02/06/2023   Procedure: ESOPHAGOGASTRODUODENOSCOPY (EGD) WITH PROPOFOL;  Surgeon: Lanelle Bal, DO;  Location: [facility];  Service: Endoscopy;  Laterality: N/A;   EUS N/A 03/21/2015   Thickened gastric wall in pre-pyloric antrum, no obvious intramural mass, fatty pancreas, no obvious pancreatic mass.    Inguinal lymph node  biopsy     right inguinal lymph node biopsy   LEAD EXTRACTION N/A 11/14/2022   Procedure: LEAD EXTRACTION;  Surgeon: Lanier Prude, MD;  Location: [facility] INVASIVE [facility] LAB;  Service: Cardiovascular;  Laterality: N/A;   LEFT HEART CATH AND CORONARY ANGIOGRAPHY N/A 09/26/2016   Procedure: Left Heart Cath and Coronary Angiography;  Surgeon: Yvonne Kendall, MD;  Location: [facility];  Service: Cardiovascular;  Laterality: N/A;   LEFT HEART CATH AND CORS/GRAFTS ANGIOGRAPHY N/A 06/29/2020   Procedure: LEFT HEART CATH AND CORS/GRAFTS ANGIOGRAPHY;  Surgeon: Tonny Bollman, MD;  Location: [facility]VAS[facility] CV LAB;  Service: Cardiovascular;  Laterality: N/A;   NECK SURGERY     PACEMAKER IMPLANT N/A 03/12/2020   Procedure: PACEMAKER IMPLANT;  Surgeon: Lanier Prude, MD;  Location: [facility];  Service: Cardiovascular;  Laterality: N/A;   POLYPECTOMY  01/19/2015   Procedure: POLYPECTOMY (GASTRIC);  Surgeon: West Bali, MD;  Location: [facility];  Service: Endoscopy;;   ROUX-EN-Y GASTRIC BYPASS     RNY gastric bypass   SHOULDER ARTHROSCOPY Left    TEE WITHOUT CARDIOVERSION N/A 11/11/2016   Procedure: TRANSESOPHAGEAL ECHOCARDIOGRAM (TEE);  Surgeon: Donata Clay, Theron Arista, MD;  Location: [facility] OR;  Service: Open Heart Surgery;  Laterality: N/A;     Home Medications    Prior to Admission medications   Medication Sig Start Date End Date Taking? Authorizing Provider  acetaminophen (TYLENOL) 500 MG tablet Take 500-1,000 mg by mouth every 6 (six) hours as needed (for pain.).    [provider]  Ascorbic Acid (VITAMIN C) 500 MG CAPS Take 500 mg by mouth daily.    [provider]  aspirin EC 81 MG tablet Take 1 tablet (81 mg total) by mouth daily. 03/16/20   Lanier Prude, MD  B Complex-C (B-COMPLEX WITH VITAMIN C) tablet Take 1 tablet by mouth daily.    [provider]  cetirizine (ZYRTEC) 10 MG tablet Take 10 mg by mouth daily as needed for allergies. Patient not taking:  Reported on 03/26/2023    [provider]  Cholecalciferol (VITAMIN D3) 50 MCG (2000 UT) TABS Take 2,000 Units by mouth daily.    [provider]  dapagliflozin propanediol (FARXIGA) 10 MG TABS tablet Take 10 mg by mouth daily.    [provider]  ferrous sulfate 325 (65 FE) MG tablet Take 325 mg by mouth 2 (two) times daily.    [provider]  FLUoxetine (PROZAC) 20 MG capsule Take 20 mg by mouth daily.    [provider]  gabapentin (NEURONTIN) 600 MG tablet Take 600 mg by mouth at bedtime.    [provider]  glucose blood (ONETOUCH VERIO) test strip USE AS DIRECTED FOUR- FIVE TIMES DAILY 09/18/17   Nida, Denman George, MD  Insulin Glargine (BASAGLAR KWIKPEN) 100 UNIT/ML SOPN INJECT 80 UNITS INTO SKIN DAILY AT 10 PM Patient taking differently: Inject 10 Units into the skin at bedtime as needed (high blood sugar). 10/20/17   Roma Kayser, MD  IRON, FERROUS SULFATE, PO Take 65 mg by mouth in the morning and at bedtime.    [provider]  isosorbide mononitrate (IMDUR) 30 MG 24 hr tablet Take 0.5 tablets (15 mg total) by mouth daily. 03/26/23 07/14/23  Ellsworth Lennox, PA-C  Magnesium 250 MG TABS Take 250 mg by mouth See admin instructions. Take 250 mg in the morning, may take another 250 mg dose up to 3 more times a day as needed for muscle cramps    [provider]  Menthol-Methyl Salicylate (MUSCLE RUB) 10-15 % CREA Apply 1 Application topically as needed for muscle pain.    [provider]  Multiple Vitamin (MULTIVITAMIN WITH MINERALS) TABS tablet Take 1 tablet by mouth in the morning and at bedtime.    [provider]  NEOMYCIN-POLYMYXIN-HYDROCORTISONE (CORTISPORIN) 1 % SOLN OTIC solution Place 3 drops into the left ear every 6 (six) hours. 07/20/23   Particia Nearing, PA-C  nitroGLYCERIN (NITROSTAT) 0.4 MG SL tablet Place 1 tablet (0.4 mg total) under the tongue every 5 (five) minutes x 3  doses as needed for chest pain. 04/10/21   Jonelle Sidle, MD  ONETOUCH VERIO test strip USE TO TEST FOUR TIMES DAILY AS DIRECTED 12/22/17   Roma Kayser, MD  Oxycodone HCl 10 MG TABS Take 10 mg by mouth 2 (two) times daily as needed (pain).    [provider]  pantoprazole (PROTONIX)  40 MG tablet TAKE 1 TABLET BY MOUTH DAILY BEFORE BREAKFAST 09/15/19   Letta Median, PA-C  Pediatric Multivitamins-[facility] COMPLETE PO) Take 1 tablet by mouth daily.    [provider]  rosuvastatin (CRESTOR) 20 MG tablet Take 1 tablet (20 mg total) by mouth daily. 02/04/23   Dunn, Tacey Ruiz, PA-C  sacubitril-valsartan (ENTRESTO) 24-26 MG Take 1 tablet by mouth 2 (two) times daily. 12/25/22   Jonelle Sidle, MD  Sodium Bicarbonate-Citric Acid (ALKA-SELTZER HEARTBURN) 1940-1000 MG TBEF Take 1-2 tablets by mouth 2 (two) times daily as needed (heartburn/indigestion).    [provider]  warfarin (COUMADIN) 5 MG tablet TAKE 1 TO 1 1/2 TABLETS BY MOUTH DAILY OR AS DIRECTED 02/18/23   Jonelle Sidle, MD   Family History Family History  Problem Relation Age of Onset   Pancreatic cancer Maternal Grandmother    Obesity Mother    Hypertension Mother    Diabetes Father    Thyroid disease Father    Heart disease Father    Colon cancer Neg Hx    Social History Social History   Tobacco Use   Smoking status: Former    Current packs/day: 0.00    Average packs/day: 1 pack/day for 23.0 years (23.0 ttl pk-yrs)    Types: Cigarettes    Start date: 10/30/1970    Quit date: 10/29/1993    Years since quitting: 29.7   Smokeless tobacco: Former    Types: Snuff, Chew  Vaping Use   Vaping status: Never Used  Substance Use Topics   Alcohol use: No    Alcohol/week: 0.0 standard drinks of alcohol   Drug use: No     Allergies   Patient has no known allergies.   Review of Systems Review of Systems PER HPI  Physical Exam Triage Vital Signs ED Triage Vitals   Encounter Vitals Group     BP 07/20/23 0945 133/75     Systolic BP Percentile --      Diastolic BP Percentile --      Pulse Rate 07/20/23 0945 86     Resp 07/20/23 0945 20     Temp 07/20/23 0945 (!) 97.3 F (36.3 C)     Temp Source 07/20/23 0945 Oral     SpO2 07/20/23 0945 98 %     Weight --      Height --      Head Circumference --      Peak Flow --      Pain Score 07/20/23 0946 0     Pain Loc --      Pain Education --      Exclude from Growth Chart --    No data found.  Updated Vital Signs BP 133/75 (BP Location: Right Arm)   Pulse 86   Temp (!) 97.3 F (36.3 C) (Oral)   Resp 20   SpO2 98%   Visual Acuity Right Eye Distance:   Left Eye Distance:   Bilateral Distance:    Right Eye Near:   Left Eye Near:    Bilateral Near:     Physical Exam Vitals and nursing note reviewed.  Constitutional:      Appearance: Normal appearance.  HENT:     Head: Atraumatic.     Right Ear: Tympanic membrane normal.     Ears:     Comments: Copious dried and scabbed blood to the left ear canal and left TM.  Do not see an obvious rupture of left TM but unable  to visualize the tissue of the canal and the TM well due to the amount of dried blood.  No active bleeding or drainage at the moment    Mouth/Throat:     Mouth: Mucous membranes are moist.  Eyes:     Extraocular Movements: Extraocular movements intact.     Conjunctiva/sclera: Conjunctivae normal.  Cardiovascular:     Rate and Rhythm: Normal rate.  Pulmonary:     Effort: Pulmonary effort is normal.  Musculoskeletal:        General: Normal range of motion.     Cervical back: Normal range of motion and neck supple.  Skin:    General: Skin is warm and dry.  Neurological:     General: No focal deficit present.     Mental Status: He is oriented to person, place, and time.  Psychiatric:        Mood and Affect: Mood normal.        Thought Content: Thought content normal.        Judgment: Judgment normal.      UC  Treatments / Results  Labs (all labs ordered are listed, but only abnormal results are displayed) Labs Reviewed - No data to display  EKG   Radiology No results found.  Procedures Procedures (including critical care time)  Medications Ordered in UC Medications - No data to display  Initial Impression / Assessment and Plan / UC Course  I have reviewed the triage vital signs and the nursing notes.  Pertinent labs & imaging results that were available during my care of the patient were reviewed by me and considered in my medical decision making (see chart for details).     Will treat with Cortisporin drops to cover for both ruptured TM or abrasion of the ear canal, unclear at this time which is causing the bleeding.  Will forego any sort of gentle flushing to clean at this time as he just got the bleeding to stop and it has been intermittently occurring any time the ear is cleaned or messed with. He is on blood thinners which is of course exacerbating the bleeding.  Bleeding is currently well-controlled.  Discussed importance of close PCP and ear nose and throat follow-up.  Final Clinical Impressions(s) / UC Diagnoses   Final diagnoses:  Otorrhea of left ear     Discharge Instructions      Follow-up as soon as possible with primary care and ear nose and throat specialist.  Use the drops and apply cotton to the outer part of the ear canal    ED Prescriptions     Medication Sig Dispense Auth. Provider   NEOMYCIN-POLYMYXIN-HYDROCORTISONE (CORTISPORIN) 1 % SOLN OTIC solution  (Status: Discontinued) Place 3 drops into the left ear every 6 (six) hours. 10 mL Particia Nearing, PA-C   NEOMYCIN-POLYMYXIN-HYDROCORTISONE (CORTISPORIN) 1 % SOLN OTIC solution Place 3 drops into the left ear every 6 (six) hours. 10 mL Particia Nearing, [place]      PDMP not reviewed this encounter.   Particia Nearing, [place] 07/20/23 1052

## 2023-07-21 DIAGNOSIS — H9222 Otorrhagia, left ear: Secondary | ICD-10-CM | POA: Diagnosis not present

## 2023-07-21 DIAGNOSIS — H9220 Otorrhagia, unspecified ear: Secondary | ICD-10-CM | POA: Diagnosis not present

## 2023-07-27 ENCOUNTER — Ambulatory Visit: Payer: Medicare PPO | Attending: Cardiology | Admitting: *Deleted

## 2023-07-27 DIAGNOSIS — D6869 Other thrombophilia: Secondary | ICD-10-CM | POA: Diagnosis not present

## 2023-07-27 DIAGNOSIS — Z5181 Encounter for therapeutic drug level monitoring: Secondary | ICD-10-CM

## 2023-07-27 DIAGNOSIS — I480 Paroxysmal atrial fibrillation: Secondary | ICD-10-CM | POA: Diagnosis not present

## 2023-07-27 LAB — POCT INR: INR: 4.1 — AB (ref 2.0–3.0)

## 2023-07-27 NOTE — Patient Instructions (Signed)
 Hold warfarin tonight then decrease dose to 1 tablet daily except 1 1/2 tablets on Mondays, Wednesdays and Fridays Recheck in 2 wks

## 2023-08-10 ENCOUNTER — Ambulatory Visit: Attending: Cardiology | Admitting: *Deleted

## 2023-08-10 DIAGNOSIS — I480 Paroxysmal atrial fibrillation: Secondary | ICD-10-CM | POA: Diagnosis not present

## 2023-08-10 DIAGNOSIS — Z5181 Encounter for therapeutic drug level monitoring: Secondary | ICD-10-CM

## 2023-08-10 DIAGNOSIS — D6869 Other thrombophilia: Secondary | ICD-10-CM | POA: Diagnosis not present

## 2023-08-10 LAB — POCT INR: INR: 3 (ref 2.0–3.0)

## 2023-08-10 NOTE — Patient Instructions (Signed)
 Continue 1 tablet daily except 1 1/2 tablets on Mondays, Wednesdays and Fridays Recheck in 4 wks

## 2023-08-12 ENCOUNTER — Other Ambulatory Visit: Payer: Self-pay | Admitting: Cardiology

## 2023-08-17 ENCOUNTER — Ambulatory Visit (INDEPENDENT_AMBULATORY_CARE_PROVIDER_SITE_OTHER): Payer: Medicare PPO

## 2023-08-17 DIAGNOSIS — I442 Atrioventricular block, complete: Secondary | ICD-10-CM | POA: Diagnosis not present

## 2023-08-17 DIAGNOSIS — I480 Paroxysmal atrial fibrillation: Secondary | ICD-10-CM

## 2023-08-18 LAB — CUP PACEART REMOTE DEVICE CHECK
Battery Remaining Longevity: 55 mo
Battery Remaining Percentage: 84 %
Battery Voltage: 3.02 V
Brady Statistic AP VP Percent: 61 %
Brady Statistic AP VS Percent: 1 %
Brady Statistic AS VP Percent: 38 %
Brady Statistic AS VS Percent: 1 %
Brady Statistic RA Percent Paced: 60 %
Date Time Interrogation Session: 20250331020017
HighPow Impedance: 55 Ohm
HighPow Impedance: 55 Ohm
Implantable Lead Connection Status: 753985
Implantable Lead Connection Status: 753985
Implantable Lead Connection Status: 753985
Implantable Lead Implant Date: 20211025
Implantable Lead Implant Date: 20240628
Implantable Lead Implant Date: 20240628
Implantable Lead Location: 753859
Implantable Lead Location: 753860
Implantable Lead Location: 753860
Implantable Pulse Generator Implant Date: 20240628
Lead Channel Impedance Value: 400 Ohm
Lead Channel Impedance Value: 460 Ohm
Lead Channel Impedance Value: 540 Ohm
Lead Channel Pacing Threshold Amplitude: 0.5 V
Lead Channel Pacing Threshold Amplitude: 1 V
Lead Channel Pacing Threshold Amplitude: 2.5 V
Lead Channel Pacing Threshold Pulse Width: 0.5 ms
Lead Channel Pacing Threshold Pulse Width: 0.5 ms
Lead Channel Pacing Threshold Pulse Width: 1 ms
Lead Channel Sensing Intrinsic Amplitude: 11.9 mV
Lead Channel Sensing Intrinsic Amplitude: 2.2 mV
Lead Channel Setting Pacing Amplitude: 0.25 V
Lead Channel Setting Pacing Amplitude: 2 V
Lead Channel Setting Pacing Amplitude: 3 V
Lead Channel Setting Pacing Pulse Width: 0.05 ms
Lead Channel Setting Pacing Pulse Width: 0.5 ms
Lead Channel Setting Sensing Sensitivity: 0.5 mV
Pulse Gen Serial Number: 5563291
Zone Setting Status: 755011

## 2023-08-22 ENCOUNTER — Encounter: Payer: Self-pay | Admitting: Cardiology

## 2023-08-27 DIAGNOSIS — E1169 Type 2 diabetes mellitus with other specified complication: Secondary | ICD-10-CM | POA: Diagnosis not present

## 2023-08-27 DIAGNOSIS — E782 Mixed hyperlipidemia: Secondary | ICD-10-CM | POA: Diagnosis not present

## 2023-09-03 ENCOUNTER — Other Ambulatory Visit: Payer: Self-pay | Admitting: Cardiology

## 2023-09-03 NOTE — Telephone Encounter (Signed)
 Refill request for warfarin:  Last INR was 3.0 on 08/10/23 Next INR due 09/07/23 LOV was 07/14/23  Refill approved.

## 2023-09-07 ENCOUNTER — Ambulatory Visit: Attending: Cardiology | Admitting: *Deleted

## 2023-09-07 DIAGNOSIS — I480 Paroxysmal atrial fibrillation: Secondary | ICD-10-CM

## 2023-09-07 DIAGNOSIS — D6869 Other thrombophilia: Secondary | ICD-10-CM | POA: Diagnosis not present

## 2023-09-07 DIAGNOSIS — Z5181 Encounter for therapeutic drug level monitoring: Secondary | ICD-10-CM | POA: Diagnosis not present

## 2023-09-07 LAB — POCT INR: INR: 2.4 (ref 2.0–3.0)

## 2023-09-07 NOTE — Patient Instructions (Signed)
 Continue 1 tablet daily except 1 1/2 tablets on Mondays, Wednesdays and Fridays Recheck in 4 wks

## 2023-09-08 ENCOUNTER — Ambulatory Visit: Payer: BC Managed Care – PPO

## 2023-09-09 DIAGNOSIS — I2510 Atherosclerotic heart disease of native coronary artery without angina pectoris: Secondary | ICD-10-CM | POA: Diagnosis not present

## 2023-09-09 DIAGNOSIS — G894 Chronic pain syndrome: Secondary | ICD-10-CM | POA: Diagnosis not present

## 2023-09-09 DIAGNOSIS — I5022 Chronic systolic (congestive) heart failure: Secondary | ICD-10-CM | POA: Diagnosis not present

## 2023-09-09 DIAGNOSIS — F331 Major depressive disorder, recurrent, moderate: Secondary | ICD-10-CM | POA: Diagnosis not present

## 2023-09-09 DIAGNOSIS — I110 Hypertensive heart disease with heart failure: Secondary | ICD-10-CM | POA: Diagnosis not present

## 2023-09-09 DIAGNOSIS — R202 Paresthesia of skin: Secondary | ICD-10-CM | POA: Diagnosis not present

## 2023-09-09 DIAGNOSIS — E782 Mixed hyperlipidemia: Secondary | ICD-10-CM | POA: Diagnosis not present

## 2023-09-09 DIAGNOSIS — K219 Gastro-esophageal reflux disease without esophagitis: Secondary | ICD-10-CM | POA: Diagnosis not present

## 2023-09-09 DIAGNOSIS — E1169 Type 2 diabetes mellitus with other specified complication: Secondary | ICD-10-CM | POA: Diagnosis not present

## 2023-09-22 DIAGNOSIS — M25512 Pain in left shoulder: Secondary | ICD-10-CM | POA: Diagnosis not present

## 2023-09-29 ENCOUNTER — Encounter: Payer: Self-pay | Admitting: Otolaryngology

## 2023-09-29 ENCOUNTER — Other Ambulatory Visit: Payer: Self-pay | Admitting: Otolaryngology

## 2023-09-29 DIAGNOSIS — M25512 Pain in left shoulder: Secondary | ICD-10-CM

## 2023-09-30 ENCOUNTER — Other Ambulatory Visit: Payer: Self-pay | Admitting: Otolaryngology

## 2023-09-30 DIAGNOSIS — M25512 Pain in left shoulder: Secondary | ICD-10-CM

## 2023-09-30 NOTE — Addendum Note (Signed)
 Addended by: Edra Govern D on: 09/30/2023 02:35 PM   Modules accepted: Orders

## 2023-09-30 NOTE — Progress Notes (Signed)
 Remote ICD transmission.

## 2023-10-01 ENCOUNTER — Other Ambulatory Visit (HOSPITAL_COMMUNITY): Payer: Self-pay | Admitting: Internal Medicine

## 2023-10-01 DIAGNOSIS — R109 Unspecified abdominal pain: Secondary | ICD-10-CM | POA: Diagnosis not present

## 2023-10-01 DIAGNOSIS — R1010 Upper abdominal pain, unspecified: Secondary | ICD-10-CM | POA: Diagnosis not present

## 2023-10-01 DIAGNOSIS — I10 Essential (primary) hypertension: Secondary | ICD-10-CM | POA: Diagnosis not present

## 2023-10-01 DIAGNOSIS — R634 Abnormal weight loss: Secondary | ICD-10-CM | POA: Diagnosis not present

## 2023-10-01 DIAGNOSIS — M25512 Pain in left shoulder: Secondary | ICD-10-CM | POA: Diagnosis not present

## 2023-10-01 DIAGNOSIS — K219 Gastro-esophageal reflux disease without esophagitis: Secondary | ICD-10-CM | POA: Diagnosis not present

## 2023-10-05 ENCOUNTER — Ambulatory Visit: Payer: Self-pay | Admitting: Nurse Practitioner

## 2023-10-05 ENCOUNTER — Ambulatory Visit: Attending: Cardiology | Admitting: *Deleted

## 2023-10-05 ENCOUNTER — Other Ambulatory Visit (HOSPITAL_COMMUNITY)
Admission: RE | Admit: 2023-10-05 | Discharge: 2023-10-05 | Disposition: A | Discharge status: Home / Self Care | Source: Ambulatory Visit | Attending: Cardiology | Admitting: Cardiology

## 2023-10-05 DIAGNOSIS — D6869 Other thrombophilia: Secondary | ICD-10-CM

## 2023-10-05 DIAGNOSIS — R760 Raised antibody titer: Secondary | ICD-10-CM

## 2023-10-05 DIAGNOSIS — I480 Paroxysmal atrial fibrillation: Secondary | ICD-10-CM | POA: Insufficient documentation

## 2023-10-05 DIAGNOSIS — Z7901 Long term (current) use of anticoagulants: Secondary | ICD-10-CM | POA: Diagnosis not present

## 2023-10-05 DIAGNOSIS — Z5181 Encounter for therapeutic drug level monitoring: Secondary | ICD-10-CM

## 2023-10-05 LAB — POCT INR: INR: 7.8 — AB (ref 2.0–3.0)

## 2023-10-05 NOTE — Patient Instructions (Signed)
 POC INR 7.8   Sent to [facility] lab for STAT PT/INR   INR 6.1 Hold warfarin x 3 days then: Continue 1 tablet daily except 1 1/2 tablets on Mondays, Wednesdays and Fridays Stop Hydrocodone and take Tylenol  if possible Recheck in 1 wk

## 2023-10-06 LAB — PROTIME-INR
INR: 6.1 (ref 0.8–1.2)
Prothrombin Time: 54.6 s — ABNORMAL HIGH (ref 11.4–15.2)

## 2023-10-08 ENCOUNTER — Ambulatory Visit
Admission: RE | Admit: 2023-10-08 | Discharge: 2023-10-08 | Disposition: A | Discharge status: Home / Self Care | Source: Ambulatory Visit | Attending: Otolaryngology | Admitting: Otolaryngology

## 2023-10-08 DIAGNOSIS — M25512 Pain in left shoulder: Secondary | ICD-10-CM

## 2023-10-08 DIAGNOSIS — M19012 Primary osteoarthritis, left shoulder: Secondary | ICD-10-CM | POA: Diagnosis not present

## 2023-10-08 DIAGNOSIS — Z9889 Other specified postprocedural states: Secondary | ICD-10-CM | POA: Diagnosis not present

## 2023-10-08 MED ORDER — IOPAMIDOL (ISOVUE-M 200) INJECTION 41%
10.0000 mL | Freq: Once | INTRAMUSCULAR | Status: AC
  Administered 2023-10-08: 10 mL via INTRA_ARTICULAR

## 2023-10-09 ENCOUNTER — Ambulatory Visit (HOSPITAL_COMMUNITY)
Admission: RE | Admit: 2023-10-09 | Discharge: 2023-10-09 | Disposition: A | Discharge status: Home / Self Care | Source: Ambulatory Visit | Attending: Internal Medicine | Admitting: Internal Medicine

## 2023-10-09 ENCOUNTER — Telehealth: Payer: Self-pay | Admitting: Cardiology

## 2023-10-09 DIAGNOSIS — K8020 Calculus of gallbladder without cholecystitis without obstruction: Secondary | ICD-10-CM | POA: Diagnosis not present

## 2023-10-09 DIAGNOSIS — K760 Fatty (change of) liver, not elsewhere classified: Secondary | ICD-10-CM | POA: Diagnosis not present

## 2023-10-09 DIAGNOSIS — R109 Unspecified abdominal pain: Secondary | ICD-10-CM | POA: Diagnosis not present

## 2023-10-09 NOTE — Telephone Encounter (Signed)
 Patient walked asking me for a ID card with his info for his pace maker after having a issue with security out front.  He told me he thinks he isn't suppose to walk through a metal detector with his pacemaker in place. He said when he walked through the other day his chest was hurting. So he doesn't want to walk through it here at [facility] again. Can someone let him know that's safe to to walk through the metal detector or, if it isn't? Thank you

## 2023-10-09 NOTE — Telephone Encounter (Signed)
 See my chart message

## 2023-10-14 NOTE — Telephone Encounter (Signed)
 Pt recently seen by primary cardiologist Dr. Londa Rival in [place].  Has echo upcoming and follow up scheduled.  Await further needs.

## 2023-10-15 ENCOUNTER — Ambulatory Visit: Attending: Cardiology | Admitting: *Deleted

## 2023-10-15 DIAGNOSIS — Z5181 Encounter for therapeutic drug level monitoring: Secondary | ICD-10-CM | POA: Diagnosis not present

## 2023-10-15 DIAGNOSIS — D6869 Other thrombophilia: Secondary | ICD-10-CM | POA: Diagnosis not present

## 2023-10-15 DIAGNOSIS — I480 Paroxysmal atrial fibrillation: Secondary | ICD-10-CM | POA: Diagnosis not present

## 2023-10-15 LAB — POCT INR: INR: 3.6 — AB (ref 2.0–3.0)

## 2023-10-15 NOTE — Patient Instructions (Signed)
 Hold warfarin tonight then decrease dose to 1 tablet daily except 1 1/2 tablets on Mondays Recheck in 10 days

## 2023-10-16 NOTE — Progress Notes (Signed)
 GI Office Note    Referring Provider: Omie Bickers, MD Primary Care Physician:  Omie Bickers, MD Primary Gastroenterologist: Rolando Cliche. Kelly April, DO  Date:  10/19/2023  ID:  Kelly [name], DOB 1955-12-31, MRN [id number]   Chief Complaint   Chief Complaint  Patient presents with   New Patient (Initial Visit)    Pt complains of abd pain with diarrhea for awhile. Worse past 2 weeks. Anything greasy, raw, or spicy makes it worse   History of Present Illness  Kelly [name] is a 68 y.o. male with a history of anemia, anxiety, aortic stenosis, A-fib on warfarin, CHF, CAD, CABG, HTN, HLD, low-grade NHL, lupus anticoagulant positive, PVD, diabetes, pacemaker, GERD, PUD, and cirrhosis  presenting today with complaint of intermittent abdominal pain  Colonoscopy in 2008 by Dr. Andriette Keeling with mild left colonic diverticulosis.  EGD in September 2016 with no evidence of varices.  He had persistent gastric ulcer, single gastric polyp.  Last office visit Aug 2024 with [name].  MELD was 14 although likely influenced by elevated INR secondary to warfarin.  Noted to be overdue for ultrasound.  No focal liver lesion on ultrasound in 2017.  AFP was normal in 2017.  Natural immunity to hepatitis A but no immunity to hep B.  Not on beta-blocker.  Reported swelling of the right leg although had lymph node removed and veins removed from right leg for CABG.  No ascites.  No history of SBP.  No significant HE.  Reported intermittent black stools but taking iron.  Bowels can fluctuate, skips a day at times between bowel movements.  Denies chest pain or shortness of breath.  Was scheduled for EGD and colonoscopy.  Ultrasound ordered.  Advised to start lactulose  15 mL daily for goal of tube BM daily.  Advised hepatitis B vaccination and follow-up in 3 months..  Abdominal ultrasound August 2024: - Cholelithiasis without acute cholecystitis - Hepatic steatosis - Possible left kidney stone  Colonoscopy  01/2023: - Sigmoid and descending diverticulosis - Exam otherwise normal - Repeat in 10 years  EGD 01/2023: - Z- line regular, 40 cm from the incisors.  - Roux- en- Y gastrojejunostomy with gastrojejunal anastomosis characterized by healthy appearing mucosa.  - Normal examined jejunum.  - No specimens collected. - Repeat EGD in 3 years  Per referral he presented to PCP office 5/15 with reports of intermittent upper abdominal pain as well as lack of appetite, unable to eat only small bites.  Pain is described as dull and cramping in nature and also reported frequent bowel movements after eating.  Denies straining or blood in his stool.  Stated he was down 13 pounds since his last office visit with them and had decreased appetite.  Abdominal ultrasound was ordered and given GI referral to assist in identifying of possible biliary etiology.  Abdominal ultrasound 10/09/2023: - Cholelithiasis (largest 2.3 cm ) without acute cholecystitis - Hepatic steatosis - CBD measuring 3.2 mm  Today:  Abdominal pain - occurred in the upper abdomen, has done well since gastric bypass (has lost at least 158lbs). About 2 weeks ago it doubled him over. The pain is off and on when it does occur. Pain occurs like stabbing in nature. Does tend to be worse with fatty and spicy foods. Salad also had bothered him - salad causes diarrhea and pain. Has history of ulcers - has been on pantoprazole  for years.   Diarrhea - Has diarrhea all the time down. Wife reports everything goes  straight through him. Fiber also likely sem sot make it worse. States he has been going about every 30 min and getting up at night to go as well. Has already been 3 times this morning. Had egg mcmuffin this morning and egg salad for breakfast. Does drink water  with flavoring packets (crystal light and crush). Denies use of artificial sweeteners. Does love to drink water . Does not drink much caffeine at all. Denies alcohol use. Not takign imodium -  gets confused with his medications.   Had some black stools a couple weeks ago he believes. No pepto use or kaopectate .  No tobacco use - quit in about 20-25 years ago. Before that at least 1 ppd.   HE - none Hepatoma screening - 5/23 - no focal liver lesion Varices - Last EGD 2024 without varices Ascites - none noted on last US  Jaundice - none  Anemia - not lately has he had any chest pain or shortness of breath. Does have nitro it at home. Gets dizzy when he bends over and stands - vasovagal most likely or postural hypotension.  Had pacemaker replaced about 2 years ago.   States his scale at home said 72 - he keeps a record of his weight at home. States at home he has had about a 10lb weight loss based on his scale at home.   Wt Readings from Last 3 Encounters:  10/19/23 214 lb (97.1 kg)  07/14/23 218 lb (98.9 kg)  03/26/23 223 lb 12.8 oz (101.5 kg)    Current Outpatient Medications  Medication Sig Dispense Refill   acetaminophen  (TYLENOL ) 500 MG tablet Take 500-1,000 mg by mouth every 6 (six) hours as needed (for pain.).     Ascorbic Acid (VITAMIN C) 500 MG CAPS Take 500 mg by mouth daily.     aspirin  EC 81 MG tablet Take 1 tablet (81 mg total) by mouth daily. 30 tablet 11   B Complex-C (B-COMPLEX WITH VITAMIN C) tablet Take 1 tablet by mouth daily.     Cholecalciferol (VITAMIN D3) 50 MCG (2000 UT) TABS Take 2,000 Units by mouth daily.     dapagliflozin propanediol (FARXIGA) 10 MG TABS tablet Take 10 mg by mouth daily.     ferrous sulfate  325 (65 FE) MG tablet Take 325 mg by mouth 2 (two) times daily.     FLUoxetine  (PROZAC ) 20 MG capsule Take 20 mg by mouth daily.     gabapentin  (NEURONTIN ) 600 MG tablet Take 600 mg by mouth at bedtime.     glucose blood (ONETOUCH VERIO) test strip USE AS DIRECTED FOUR- FIVE TIMES DAILY 450 each 1   Insulin  Glargine (BASAGLAR  KWIKPEN) 100 UNIT/ML SOPN INJECT 80 UNITS INTO SKIN DAILY AT 10 PM (Patient taking differently: Inject 10 Units into  the skin at bedtime as needed (high blood sugar).) 30 mL 2   IRON, FERROUS SULFATE , PO Take 65 mg by mouth in the morning and at bedtime.     Magnesium  250 MG TABS Take 250 mg by mouth See admin instructions. Take 250 mg in the morning, may take another 250 mg dose up to 3 more times a day as needed for muscle cramps     Menthol-Methyl Salicylate (MUSCLE RUB) 10-15 % CREA Apply 1 Application topically as needed for muscle pain.     Multiple Vitamin (MULTIVITAMIN WITH MINERALS) TABS tablet Take 1 tablet by mouth in the morning and at bedtime.     NEOMYCIN -POLYMYXIN-HYDROCORTISONE (CORTISPORIN) 1 % SOLN OTIC solution Place 3  drops into the left ear every 6 (six) hours. 10 mL 0   nitroGLYCERIN  (NITROSTAT ) 0.4 MG SL tablet PLACE 1 TABLET BY MOUTH UNDER THE TONGUE EVERY 5 MINS X 3DOSES AS NEEDED FOR CHEST PAIN 25 tablet 3   ONETOUCH VERIO test strip USE TO TEST FOUR TIMES DAILY AS DIRECTED 450 each 2   Oxycodone  HCl 10 MG TABS Take 10 mg by mouth 2 (two) times daily as needed (pain).     pantoprazole  (PROTONIX ) 40 MG tablet TAKE 1 TABLET BY MOUTH DAILY BEFORE BREAKFAST 90 tablet 0   Pediatric Multivitamins-Iron (FLINTSTONES COMPLETE PO) Take 1 tablet by mouth daily.     rosuvastatin  (CRESTOR ) 20 MG tablet Take 1 tablet (20 mg total) by mouth daily. 90 tablet 2   sacubitril-valsartan (ENTRESTO ) 24-26 MG Take 1 tablet by mouth 2 (two) times daily. 60 tablet 11   Sodium Bicarbonate -Citric Acid (ALKA-SELTZER HEARTBURN) 1940-1000 MG TBEF Take 1-2 tablets by mouth 2 (two) times daily as needed (heartburn/indigestion).     warfarin (COUMADIN ) 5 MG tablet TAKE 1 TO 1 AND 1/2 TABLETS BY MOUTH DAILY OR AS DIRECTED 45 tablet 5   isosorbide  mononitrate (IMDUR ) 30 MG 24 hr tablet Take 0.5 tablets (15 mg total) by mouth daily. 45 tablet 3   No current facility-administered medications for this visit.    Past Medical History:  Diagnosis Date   AICD (automatic cardioverter/defibrillator) present    Anemia     Anxiety    Aortic stenosis    Arthritis    Atrial fibrillation [facility])    Atrial fibrillation (HCC)    CHF (congestive heart failure) (HCC)    Chronic pain    Cirrhosis (HCC)    Coronary atherosclerosis of native coronary artery    BMS LAD and PTCA RCA 2000,BMS RCA/PLA 2001, CABG June 2018   Depression    Diverticulitis    Essential hypertension    Fatty liver    Gastric ulcer    GERD (gastroesophageal reflux disease)    Hyperlipidemia    Low-grade NHL (non-Hodgkin's lymphoma) 2007   Lupus anticoagulant positive    MI (myocardial infarction) (HCC) 2001   Pacemaker    Paresthesia of upper limb    Peripheral neuropathy    Peripheral vascular disease (HCC)    blood clots in legs   Sleep apnea    Thrombocytopenia (HCC)    Trifascicular block    Type 2 diabetes mellitus (HCC)    Umbilical hernia     Past Surgical History:  Procedure Laterality Date   Axillary abscess     left incision and drainage left   BACK SURGERY     multiple   BIOPSY  08/15/2014   Procedure: BIOPSY;  Surgeon: Alyce Jubilee, MD;  Location: [facility];  Service: Endoscopy;;  Gastric   BIOPSY  01/19/2015   Procedure: BIOPSY (GASTRIC ULCER);  Surgeon: Alyce Jubilee, MD;  Location: [facility];  Service: Endoscopy;;   CARDIAC CATHETERIZATION     cardiac stents     x5 stents   COLONOSCOPY  June 2008   Dr. Susana Enter Medoff: Mild left colonic diverticulosis   COLONOSCOPY WITH PROPOFOL  N/A 02/06/2023   Procedure: COLONOSCOPY WITH PROPOFOL ;  Surgeon: Vinetta Greening, DO;  Location: [facility];  Service: Endoscopy;  Laterality: N/A;  8:45 am, asa 3   CORONARY ARTERY BYPASS GRAFT N/A 11/11/2016   Procedure: CORONARY ARTERY BYPASS GRAFTING (CABG) x 3, USING LEFT MAMMARY ARTERY AND RIGHT GREATER SAPHENOUS VEIN HARVESTED ENDOSCOPICALLY;  Surgeon: Matt Song, Donata Fryer, MD;  Location: [facility];  Service: Open Heart Surgery;  Laterality: N/A;   CORONARY PRESSURE/FFR STUDY N/A 09/26/2016   Procedure: Intravascular Pressure Wire/FFR  Study;  Surgeon: Sammy Crisp, MD;  Location: [facility] INVASIVE CV LAB;  Service: Cardiovascular;  Laterality: N/A;   Dental extractions     ESOPHAGOGASTRODUODENOSCOPY  June 2008   Dr. Susana Enter Medoff: Gastric ulcer, antral, biopsy with reactive changes associated glandular atrophy, no H pylori. No features of lymphoma. Likely NSAID related   ESOPHAGOGASTRODUODENOSCOPY  August 2008   Dr. Susana Enter Medoff: Complete healing of gastric ulcers.   ESOPHAGOGASTRODUODENOSCOPY (EGD) WITH PROPOFOL  N/A 08/15/2014   SLF: 1. Abnormal pain due to ulcers & gastriitis    ESOPHAGOGASTRODUODENOSCOPY (EGD) WITH PROPOFOL  N/A 01/19/2015   SLF: 1. Persistant ulcer with firm base 2. single gastric polyp found in the gastric antrum. Ulcer base    ESOPHAGOGASTRODUODENOSCOPY (EGD) WITH PROPOFOL  N/A 02/06/2023   Procedure: ESOPHAGOGASTRODUODENOSCOPY (EGD) WITH PROPOFOL ;  Surgeon: Vinetta Greening, DO;  Location: [facility];  Service: Endoscopy;  Laterality: N/A;   EUS N/A 03/21/2015   Thickened gastric wall in pre-pyloric antrum, no obvious intramural mass, fatty pancreas, no obvious pancreatic mass.    Inguinal lymph node biopsy     right inguinal lymph node biopsy   LEAD EXTRACTION N/A 11/14/2022   Procedure: LEAD EXTRACTION;  Surgeon: Boyce Byes, MD;  Location: [facility] INVASIVE CV LAB;  Service: Cardiovascular;  Laterality: N/A;   LEFT HEART CATH AND CORONARY ANGIOGRAPHY N/A 09/26/2016   Procedure: Left Heart Cath and Coronary Angiography;  Surgeon: Sammy Crisp, MD;  Location: [facility] INV[facility] CV LAB;  Service: Cardiovascular;  Laterality: N/A;   LEFT HEART CATH AND CORS/GRAFTS ANGIOGRAPHY N/A 06/29/2020   Procedure: LEFT HEART CATH AND CORS/GRAFTS ANGIOGRAPHY;  Surgeon: Arnoldo Lapping, MD;  Location: [facility] INVASIVE CV LAB;  Service: Cardiovascular;  Laterality: N/A;   NECK SURGERY     PACEMAKER IMPLANT N/A 03/12/2020   Procedure: PACEMAKER IMPLANT;  Surgeon: Boyce Byes, MD;  Location: [facility] INVAS[facility] CV LAB;  Service:  Cardiovascular;  Laterality: N/A;   POLYPECTOMY  01/19/2015   Procedure: POLYPECTOMY (GASTRIC);  Surgeon: Alyce Jubilee, MD;  Location: [facility];  Service: Endoscopy;;   ROUX-EN-Y GASTRIC BYPASS     RNY gastric bypass   SHOULDER ARTHROSCOPY Left    TEE WITHOUT CARDIOVERSION N/A 11/11/2016   Procedure: TRANSESOPHAGEAL ECHOCARDIOGRAM (TEE);  Surgeon: Matt Song, Donata Fryer, MD;  Location: [facility] OR;  Service: Open Heart Surgery;  Laterality: N/A;    Family History  Problem Relation Age of Onset   Pancreatic cancer Maternal Grandmother    Obesity Mother    Hypertension Mother    Diabetes Father    Thyroid  disease Father    Heart disease Father    Colon cancer Neg Hx     Allergies as of 10/19/2023   (No Known Allergies)    Social History   Socioeconomic History   Marital status: Married    Spouse name: Not on file   Number of children: Not on file   Years of education: Not on file   Highest education level: Not on file  Occupational History   Not on file  Tobacco Use   Smoking status: Former    Current packs/day: 0.00    Average packs/day: 1 pack/day for 23.0 years (23.0 ttl pk-yrs)    Types: Cigarettes    Start date: 10/30/1970    Quit date: 10/29/1993    Years since quitting: 29.9  Smokeless tobacco: Former    Types: Snuff, Chew  Vaping Use   Vaping status: Never Used  Substance and Sexual Activity   Alcohol use: No    Alcohol/week: 0.0 standard drinks of alcohol   Drug use: No   Sexual activity: Never    Birth control/protection: None  Other Topics Concern   Not on file  Social History Narrative   Not on file   Social Drivers of Health   Financial Resource Strain: High Risk (10/17/2020)   Overall Financial Resource Strain (CARDIA)    Difficulty of Paying Living Expenses: Hard  Food Insecurity: No Food Insecurity (11/14/2022)   Hunger Vital Sign    Worried About Running Out of Food in the Last Year: Never true    Ran Out of Food in the Last Year: Never true   Transportation Needs: No Transportation Needs (11/14/2022)   PRAPARE - Administrator, Civil Service (Medical): No    Lack of Transportation (Non-Medical): No  Physical Activity: Sufficiently Active (10/17/2020)   Exercise Vital Sign    Days of Exercise per Week: 7 days    Minutes of Exercise per Session: 60 min  Stress: Stress Concern Present (10/17/2020)   [employer] of Occupational Health - Occupational Stress Questionnaire    Feeling of Stress : Very much  Social Connections: Unknown (09/30/2021)   [facility] from [facility]   Social Network    Social Network: Not on file    Review of Systems   Gen: Denies fever, chills, anorexia. Denies fatigue, weakness, weight loss.  CV: Denies chest pain, palpitations, syncope, peripheral edema, and claudication. Resp: Denies dyspnea at rest, cough, wheezing, coughing up blood, and pleurisy. GI: See HPI Derm: Denies rash, itching, dry skin Psych: Denies depression, anxiety, memory loss, confusion. No homicidal or suicidal ideation.  Heme: Denies bruising, bleeding, and enlarged lymph nodes.  Physical Exam   BP 123/70   Pulse (!) 59   Temp 98.5 F (36.9 C)   Ht 6\' 3"  (1.905 m)   Wt 214 lb (97.1 kg)   BMI 26.75 kg/m   General:   Alert and oriented. No distress noted. Pleasant and cooperative.  Head:  Normocephalic and atraumatic. Eyes:  Conjuctiva clear without scleral icterus. Mouth:  Oral mucosa pink and moist. Good dentition. No lesions. Lungs:  Clear to auscultation bilaterally. No wheezes, rales, or rhonchi. No distress.  Heart:  S1, S2 present without murmurs appreciated.  Abdomen:  +BS, soft, non-distended. Mild ttp to epigastrium, RUQ, and lower abdomen. No rebound or guarding. No HSM. Firmness noted around umbilicus - concern to possible scar tissue - no obvious mass.  Rectal: deferred Msk:  Symmetrical without gross deformities. Normal posture. Extremities:  Without edema. Neurologic:   Alert and  oriented x4 Psych:  Alert and cooperative. Normal mood and affect.  Assessment  Kelly [name] is a 68 y.o. male with a history of anemia, anxiety, aortic stenosis, A-fib on warfarin, CHF, CAD, CABG, HTN, HLD, low-grade NHL, lupus anticoagulant positive, PVD, diabetes, pacemaker, GERD, PUD, and cirrhosis presenting today with abdominal pain.  Diarrhea, abdominal pain -epigastric and lower:  - IBS vs EPI vs bile acid component (given relation to fatty/spicy meals) and/or combination - Colonoscopy September 2024 without abnormality. - Complains of mostly diarrhea post meals as well as some urgency however can go up to every 30 minutes with bowel movements.  Per picture showed that patient in the office today, he has had some lighter colored stools  as well as possible pieces of undigested food within the stool and mucus. - Pain is described as stabbing in nature, more so in the upper abdomen when this occurs and can happen with meals. - Has admitted to some early satiety and a little weight loss, about 10 pounds within the last 6 months.  Weighs himself daily at home.  Has been eating less secondary to not wanting to go to the bathroom as much. - Will order pancreatic insufficiency to assess for EPI given his history of diabetes and relationship to meals, if elastase is normal may consider cholestyramine for bile acid diarrhea, for now we will use Imodium scheduled in the mornings and hold for constipation. - Advised low-fat diet. - Given his nocturnal stools, ordering CT scan to assess for possible malignancy, colonoscopy up-to-date without any abnormality in September.  Low threshold to repeat.  Cirrhosis:  - last calculated MELD noted to be 14 - Will reassess labs to calculate new MELD although will likely be somewhat elevated given his INR. - No evidence of jaundice, pruritus, hepatic encephalopathy.  No ascites or significant peripheral edema. - Most recent imaging without evidence  of hepatoma.  EGD up-to-date without varices.  GERD: Maintained on pantoprazole  40 mg once daily.  Denies any breakthrough symptoms.  Last EGD September 2024 with evidence of Roux-en-Y gastric bypass and healthy appearing mucosa, normal jejunum.  Due for surveillance in 2027.  For now we will continue PPI.  Was taking Alka-Seltzer given his upper abdominal pain however has recently stopped this.  Anemia: - EGD and Colonoscopy up to date as of September 2024. - Does report some possible black stools about 2 weeks ago however his INR has been significantly elevated as of late. - Currently maintained on iron twice daily, likely somewhat secondary to history of Rehman gastric bypass.  Also has been supratherapeutic in regards to his warfarin as of late.  PLAN   CT A/P given abdominal pain CBC, CMP, CRP, Iron panel, Pancreatic elastase Cholestyramine if elastase is negative. Possible trial of pancreatic enzymes Continue pantoprazole  40 mg once daily. Start with Imodium 2 mg once daily in the mornings, if still having diarrhea may take 1 mg prior to bed.  Stop if no bowel movement in 24 hours. Low fat diet Continue iron twice daily Low threshold to repeat colonoscopy given possible melena and early satiety/weight loss. Follow up in 8 weeks.    Julian Obey, MSN, FNP-BC, AGACNP-BC [facility]

## 2023-10-19 ENCOUNTER — Telehealth: Payer: Self-pay | Admitting: *Deleted

## 2023-10-19 ENCOUNTER — Encounter: Payer: Self-pay | Admitting: Gastroenterology

## 2023-10-19 ENCOUNTER — Ambulatory Visit: Admitting: Gastroenterology

## 2023-10-19 VITALS — BP 123/70 | HR 59 | Temp 98.5°F | Ht 75.0 in | Wt 214.0 lb

## 2023-10-19 DIAGNOSIS — R197 Diarrhea, unspecified: Secondary | ICD-10-CM

## 2023-10-19 DIAGNOSIS — K7460 Unspecified cirrhosis of liver: Secondary | ICD-10-CM

## 2023-10-19 DIAGNOSIS — D649 Anemia, unspecified: Secondary | ICD-10-CM | POA: Diagnosis not present

## 2023-10-19 DIAGNOSIS — R1013 Epigastric pain: Secondary | ICD-10-CM | POA: Diagnosis not present

## 2023-10-19 DIAGNOSIS — K7469 Other cirrhosis of liver: Secondary | ICD-10-CM

## 2023-10-19 DIAGNOSIS — K219 Gastro-esophageal reflux disease without esophagitis: Secondary | ICD-10-CM

## 2023-10-19 DIAGNOSIS — R1030 Lower abdominal pain, unspecified: Secondary | ICD-10-CM

## 2023-10-19 DIAGNOSIS — R6881 Early satiety: Secondary | ICD-10-CM | POA: Diagnosis not present

## 2023-10-19 DIAGNOSIS — K921 Melena: Secondary | ICD-10-CM

## 2023-10-19 DIAGNOSIS — R1031 Right lower quadrant pain: Secondary | ICD-10-CM

## 2023-10-19 DIAGNOSIS — K8681 Exocrine pancreatic insufficiency: Secondary | ICD-10-CM | POA: Diagnosis not present

## 2023-10-19 NOTE — Patient Instructions (Addendum)
 We will get you scheduled for a Ct of your abdomen and pelvis in the near future to further evaluate your abdominal pain and diarrhea.  Please have labs and stool studies completed at [facility]. We will call you with results once they have been received. Please allow 3-5 business days for review. 2 locations for [facility] in [place]:              1. [address] [place]              2. [address] [place]   Will be in touch with any further recommendations for your diarrhea after receiving the results of your labs and stool studies.  For now continue pantoprazole  40 mg once daily, 30 minutes prior to breakfast. Continue taking your iron twice daily.  Start with Imodium 2 mg once daily in the mornings, if still having diarrhea may take 1 mg prior to bed.  Stop if no bowel movement in 24 hours.  Please make sure you are following a low-fat diet.  We will follow-up in about 8 weeks.  It was a pleasure to see you today. I want to create trusting relationships with patients. If you receive a survey regarding your visit,  I greatly appreciate you taking time to fill this out on paper or through your MyChart. I value your feedback.  Julian Obey, MSN, FNP-BC, AGACNP-BC [facility]

## 2023-10-19 NOTE — Telephone Encounter (Signed)
 Pending VIA COHERE: In clinical review Tracking [id number]

## 2023-10-20 NOTE — Telephone Encounter (Signed)
 PA still pending clinical review

## 2023-10-21 ENCOUNTER — Other Ambulatory Visit: Payer: Self-pay | Admitting: Gastroenterology

## 2023-10-21 ENCOUNTER — Ambulatory Visit: Payer: Self-pay | Admitting: Gastroenterology

## 2023-10-21 DIAGNOSIS — R197 Diarrhea, unspecified: Secondary | ICD-10-CM

## 2023-10-21 DIAGNOSIS — K8689 Other specified diseases of pancreas: Secondary | ICD-10-CM

## 2023-10-21 LAB — PANCREATIC ELASTASE, FECAL: Pancreatic Elastase, Fecal: 82 ug Elast./g — ABNORMAL LOW (ref >200)

## 2023-10-21 MED ORDER — PANCRELIPASE (LIP-PROT-AMYL) 36000-114000 UNITS PO CPEP
ORAL_CAPSULE | ORAL | 11 refills | Status: AC
Start: 2023-10-21 — End: ?

## 2023-10-21 NOTE — Telephone Encounter (Signed)
 PA still pending review.

## 2023-10-22 NOTE — Telephone Encounter (Signed)
 CT scheduled. Called pt aware of appt details.

## 2023-10-22 NOTE — Telephone Encounter (Signed)
 PA approved Authorization #[id number], DOS: 10/20/2023 - 12/19/2023

## 2023-10-24 LAB — COMPREHENSIVE METABOLIC PANEL WITH GFR
ALT: 35 IU/L (ref 0–44)
AST: 45 IU/L — ABNORMAL HIGH (ref 0–40)
Albumin: 4.1 g/dL (ref 3.9–4.9)
Alkaline Phosphatase: 90 IU/L (ref 44–121)
BUN/Creatinine Ratio: 12 (ref 10–24)
BUN: 12 mg/dL (ref 8–27)
Bilirubin Total: 0.5 mg/dL (ref 0.0–1.2)
CO2: 23 mmol/L (ref 20–29)
Calcium: 9.2 mg/dL (ref 8.6–10.2)
Chloride: 105 mmol/L (ref 96–106)
Creatinine, Ser: 1.02 mg/dL (ref 0.76–1.27)
Globulin, Total: 2.8 g/dL (ref 1.5–4.5)
Glucose: 117 mg/dL — ABNORMAL HIGH (ref 70–99)
Potassium: 4.3 mmol/L (ref 3.5–5.2)
Sodium: 144 mmol/L (ref 134–144)
Total Protein: 6.9 g/dL (ref 6.0–8.5)
eGFR: 82 mL/min/{1.73_m2} (ref >59)

## 2023-10-24 LAB — CBC
Hematocrit: 43.6 % (ref 37.5–51.0)
Hemoglobin: 14.5 g/dL (ref 13.0–17.7)
MCH: 33.6 pg — ABNORMAL HIGH (ref 26.6–33.0)
MCHC: 33.3 g/dL (ref 31.5–35.7)
MCV: 101 fL — ABNORMAL HIGH (ref 79–97)
Platelets: 112 10*3/uL — ABNORMAL LOW (ref 150–450)
RBC: 4.32 x10E6/uL (ref 4.14–5.80)
RDW: 12.1 % (ref 11.6–15.4)
WBC: 5.9 10*3/uL (ref 3.4–10.8)

## 2023-10-24 LAB — IRON,TIBC AND FERRITIN PANEL
Ferritin: 462 ng/mL — ABNORMAL HIGH (ref 30–400)
Iron Saturation: 54 % (ref 15–55)
Iron: 106 ug/dL (ref 38–169)
Total Iron Binding Capacity: 198 ug/dL — ABNORMAL LOW (ref 250–450)
UIBC: 92 ug/dL — ABNORMAL LOW (ref 111–343)

## 2023-10-24 LAB — C-REACTIVE PROTEIN: CRP: <1 mg/L (ref 0–10)

## 2023-10-24 LAB — PANCREATIC ELASTASE, FECAL

## 2023-10-26 ENCOUNTER — Ambulatory Visit: Attending: Cardiology | Admitting: *Deleted

## 2023-10-26 DIAGNOSIS — D6869 Other thrombophilia: Secondary | ICD-10-CM | POA: Diagnosis not present

## 2023-10-26 DIAGNOSIS — Z5181 Encounter for therapeutic drug level monitoring: Secondary | ICD-10-CM

## 2023-10-26 DIAGNOSIS — I480 Paroxysmal atrial fibrillation: Secondary | ICD-10-CM | POA: Diagnosis not present

## 2023-10-26 LAB — POCT INR
INR: 2 (ref 2.0–3.0)
INR: 2 (ref 2.0–3.0)

## 2023-10-26 NOTE — Patient Instructions (Signed)
 Continue warfarin 1 tablet daily except 1 1/2 tablets on Mondays Recheck in 3 wks

## 2023-10-28 DIAGNOSIS — I10 Essential (primary) hypertension: Secondary | ICD-10-CM | POA: Diagnosis not present

## 2023-10-28 DIAGNOSIS — R634 Abnormal weight loss: Secondary | ICD-10-CM | POA: Diagnosis not present

## 2023-10-28 DIAGNOSIS — R109 Unspecified abdominal pain: Secondary | ICD-10-CM | POA: Diagnosis not present

## 2023-10-28 DIAGNOSIS — M25512 Pain in left shoulder: Secondary | ICD-10-CM | POA: Diagnosis not present

## 2023-10-28 DIAGNOSIS — L237 Allergic contact dermatitis due to plants, except food: Secondary | ICD-10-CM | POA: Diagnosis not present

## 2023-10-28 DIAGNOSIS — K219 Gastro-esophageal reflux disease without esophagitis: Secondary | ICD-10-CM | POA: Diagnosis not present

## 2023-11-03 ENCOUNTER — Other Ambulatory Visit: Payer: Self-pay

## 2023-11-03 MED ORDER — ROSUVASTATIN CALCIUM 20 MG PO TABS: 20.0000 mg | ORAL_TABLET | Freq: Every day | ORAL | 3 refills | Status: DC

## 2023-11-05 ENCOUNTER — Other Ambulatory Visit: Payer: Self-pay | Admitting: *Deleted

## 2023-11-05 MED ORDER — ROSUVASTATIN CALCIUM 20 MG PO TABS
20.0000 mg | ORAL_TABLET | Freq: Every day | ORAL | 3 refills | Status: AC
Start: 2023-11-05 — End: ?

## 2023-11-09 ENCOUNTER — Other Ambulatory Visit: Payer: Self-pay | Admitting: Cardiology

## 2023-11-11 ENCOUNTER — Other Ambulatory Visit: Payer: Self-pay | Admitting: Cardiology

## 2023-11-11 DIAGNOSIS — M25512 Pain in left shoulder: Secondary | ICD-10-CM | POA: Diagnosis not present

## 2023-11-11 NOTE — Telephone Encounter (Signed)
 Refill request for warfarin:  Last INR was 2.0 on 10/26/23 Next INR due 11/16/23 LOV was 07/14/23  Refill approved.

## 2023-11-16 ENCOUNTER — Ambulatory Visit

## 2023-11-16 ENCOUNTER — Ambulatory Visit (INDEPENDENT_AMBULATORY_CARE_PROVIDER_SITE_OTHER): Payer: Medicare PPO

## 2023-11-16 DIAGNOSIS — I442 Atrioventricular block, complete: Secondary | ICD-10-CM | POA: Diagnosis not present

## 2023-11-16 DIAGNOSIS — I480 Paroxysmal atrial fibrillation: Secondary | ICD-10-CM

## 2023-11-16 LAB — CUP PACEART REMOTE DEVICE CHECK
Battery Remaining Longevity: 53 mo
Battery Remaining Percentage: 80 %
Battery Voltage: 3.01 V
Brady Statistic AP VP Percent: 60 %
Brady Statistic AP VS Percent: 1 %
Brady Statistic AS VP Percent: 40 %
Brady Statistic AS VS Percent: 1 %
Brady Statistic RA Percent Paced: 58 %
Date Time Interrogation Session: 20250630020018
HighPow Impedance: 55 Ohm
HighPow Impedance: 55 Ohm
Implantable Lead Connection Status: 753985
Implantable Lead Connection Status: 753985
Implantable Lead Connection Status: 753985
Implantable Lead Implant Date: 20211025
Implantable Lead Implant Date: 20240628
Implantable Lead Implant Date: 20240628
Implantable Lead Location: 753859
Implantable Lead Location: 753860
Implantable Lead Location: 753860
Implantable Pulse Generator Implant Date: 20240628
Lead Channel Impedance Value: 400 Ohm
Lead Channel Impedance Value: 490 Ohm
Lead Channel Impedance Value: 530 Ohm
Lead Channel Pacing Threshold Amplitude: 0.625 V
Lead Channel Pacing Threshold Amplitude: 1 V
Lead Channel Pacing Threshold Amplitude: 2.5 V
Lead Channel Pacing Threshold Pulse Width: 0.5 ms
Lead Channel Pacing Threshold Pulse Width: 0.5 ms
Lead Channel Pacing Threshold Pulse Width: 1 ms
Lead Channel Sensing Intrinsic Amplitude: 2 mV
Lead Channel Sensing Intrinsic Amplitude: 8.9 mV
Lead Channel Setting Pacing Amplitude: 0.25 V
Lead Channel Setting Pacing Amplitude: 2 V
Lead Channel Setting Pacing Amplitude: 3 V
Lead Channel Setting Pacing Pulse Width: 0.05 ms
Lead Channel Setting Pacing Pulse Width: 0.5 ms
Lead Channel Setting Sensing Sensitivity: 0.5 mV
Pulse Gen Serial Number: 5563291
Zone Setting Status: 755011

## 2023-11-18 ENCOUNTER — Ambulatory Visit: Payer: Self-pay | Admitting: Cardiology

## 2023-11-23 ENCOUNTER — Ambulatory Visit: Attending: Cardiology | Admitting: *Deleted

## 2023-11-23 DIAGNOSIS — Z5181 Encounter for therapeutic drug level monitoring: Secondary | ICD-10-CM | POA: Diagnosis not present

## 2023-11-23 DIAGNOSIS — I480 Paroxysmal atrial fibrillation: Secondary | ICD-10-CM | POA: Diagnosis not present

## 2023-11-23 DIAGNOSIS — D6869 Other thrombophilia: Secondary | ICD-10-CM

## 2023-11-23 LAB — POCT INR: INR: 1.3 — AB (ref 2.0–3.0)

## 2023-11-23 NOTE — Progress Notes (Signed)
Please see anticoagulation encounter.

## 2023-11-23 NOTE — Patient Instructions (Signed)
 Take warfarin 2 tablets tonight, 1 1/2 tablets tomorrow night then increase dose to 1 tablet daily except 1 1/2 tablets on Mondays, Wednesdays and Fridays Recheck in 1 wk

## 2023-12-01 ENCOUNTER — Ambulatory Visit: Attending: Cardiology | Admitting: *Deleted

## 2023-12-01 DIAGNOSIS — Z5181 Encounter for therapeutic drug level monitoring: Secondary | ICD-10-CM | POA: Diagnosis not present

## 2023-12-01 DIAGNOSIS — I480 Paroxysmal atrial fibrillation: Secondary | ICD-10-CM | POA: Diagnosis not present

## 2023-12-01 DIAGNOSIS — D6869 Other thrombophilia: Secondary | ICD-10-CM

## 2023-12-01 LAB — POCT INR: INR: 1.7 — AB (ref 2.0–3.0)

## 2023-12-01 NOTE — Patient Instructions (Signed)
 Take warfarin 1 1/2 tablets tonight increase dose to 1 1/2 tablets on tablet daily except 1 tablet on Sundays, Tuesdays and Thursdays Recheck in 2 wks

## 2023-12-01 NOTE — Progress Notes (Signed)
Please see anticoagulation encounter.

## 2023-12-03 ENCOUNTER — Ambulatory Visit (HOSPITAL_COMMUNITY)
Admission: RE | Admit: 2023-12-03 | Discharge: 2023-12-03 | Disposition: A | Discharge status: Home / Self Care | Source: Ambulatory Visit | Attending: Gastroenterology | Admitting: Gastroenterology

## 2023-12-03 DIAGNOSIS — Z8572 Personal history of non-Hodgkin lymphomas: Secondary | ICD-10-CM | POA: Diagnosis not present

## 2023-12-03 DIAGNOSIS — R197 Diarrhea, unspecified: Secondary | ICD-10-CM | POA: Diagnosis not present

## 2023-12-03 DIAGNOSIS — R1030 Lower abdominal pain, unspecified: Secondary | ICD-10-CM | POA: Insufficient documentation

## 2023-12-03 DIAGNOSIS — R1013 Epigastric pain: Secondary | ICD-10-CM | POA: Insufficient documentation

## 2023-12-03 DIAGNOSIS — K8020 Calculus of gallbladder without cholecystitis without obstruction: Secondary | ICD-10-CM | POA: Diagnosis not present

## 2023-12-03 DIAGNOSIS — R1031 Right lower quadrant pain: Secondary | ICD-10-CM | POA: Insufficient documentation

## 2023-12-03 DIAGNOSIS — K5730 Diverticulosis of large intestine without perforation or abscess without bleeding: Secondary | ICD-10-CM | POA: Diagnosis not present

## 2023-12-03 LAB — POCT I-STAT CREATININE: Creatinine, Ser: 1.2 mg/dL (ref 0.61–1.24)

## 2023-12-03 MED ORDER — IOHEXOL 9 MG/ML PO SOLN
ORAL | Status: AC
  Filled 2023-12-03: qty 1000

## 2023-12-03 MED ORDER — IOHEXOL 300 MG/ML  SOLN
100.0000 mL | Freq: Once | INTRAMUSCULAR | Status: AC | PRN
  Administered 2023-12-03: 100 mL via INTRAVENOUS

## 2023-12-08 ENCOUNTER — Ambulatory Visit: Payer: BC Managed Care – PPO

## 2023-12-14 ENCOUNTER — Ambulatory Visit: Admitting: Gastroenterology

## 2023-12-15 ENCOUNTER — Encounter

## 2023-12-16 ENCOUNTER — Ambulatory Visit: Attending: Cardiology | Admitting: *Deleted

## 2023-12-16 DIAGNOSIS — D6869 Other thrombophilia: Secondary | ICD-10-CM | POA: Diagnosis not present

## 2023-12-16 DIAGNOSIS — Z5181 Encounter for therapeutic drug level monitoring: Secondary | ICD-10-CM | POA: Diagnosis not present

## 2023-12-16 DIAGNOSIS — I480 Paroxysmal atrial fibrillation: Secondary | ICD-10-CM | POA: Diagnosis not present

## 2023-12-16 LAB — POCT INR: INR: 2 (ref 2.0–3.0)

## 2023-12-16 NOTE — Patient Instructions (Signed)
 Continue warfarin 1 1/2 tablets on tablet daily except 1 tablet on Sundays, Tuesdays and Thursdays Recheck in 4 wks

## 2023-12-16 NOTE — Progress Notes (Signed)
 INR 2.0 Please see anticoagulation encounter.

## 2023-12-17 NOTE — Progress Notes (Signed)
 Remote ICD transmission.

## 2023-12-26 ENCOUNTER — Other Ambulatory Visit: Payer: Self-pay | Admitting: Cardiology

## 2023-12-31 DIAGNOSIS — Z125 Encounter for screening for malignant neoplasm of prostate: Secondary | ICD-10-CM | POA: Diagnosis not present

## 2023-12-31 DIAGNOSIS — E782 Mixed hyperlipidemia: Secondary | ICD-10-CM | POA: Diagnosis not present

## 2023-12-31 DIAGNOSIS — E1169 Type 2 diabetes mellitus with other specified complication: Secondary | ICD-10-CM | POA: Diagnosis not present

## 2024-01-06 DIAGNOSIS — E1169 Type 2 diabetes mellitus with other specified complication: Secondary | ICD-10-CM | POA: Diagnosis not present

## 2024-01-06 DIAGNOSIS — M25512 Pain in left shoulder: Secondary | ICD-10-CM | POA: Diagnosis not present

## 2024-01-06 DIAGNOSIS — E782 Mixed hyperlipidemia: Secondary | ICD-10-CM | POA: Diagnosis not present

## 2024-01-06 DIAGNOSIS — R202 Paresthesia of skin: Secondary | ICD-10-CM | POA: Diagnosis not present

## 2024-01-06 DIAGNOSIS — I10 Essential (primary) hypertension: Secondary | ICD-10-CM | POA: Diagnosis not present

## 2024-01-06 DIAGNOSIS — R109 Unspecified abdominal pain: Secondary | ICD-10-CM | POA: Diagnosis not present

## 2024-01-06 DIAGNOSIS — K219 Gastro-esophageal reflux disease without esophagitis: Secondary | ICD-10-CM | POA: Diagnosis not present

## 2024-01-06 DIAGNOSIS — Z23 Encounter for immunization: Secondary | ICD-10-CM | POA: Diagnosis not present

## 2024-01-06 DIAGNOSIS — I2510 Atherosclerotic heart disease of native coronary artery without angina pectoris: Secondary | ICD-10-CM | POA: Diagnosis not present

## 2024-01-08 ENCOUNTER — Encounter: Payer: Self-pay | Admitting: Radiology

## 2024-01-08 ENCOUNTER — Telehealth: Payer: Self-pay | Admitting: Cardiology

## 2024-01-08 DIAGNOSIS — M12812 Other specific arthropathies, not elsewhere classified, left shoulder: Secondary | ICD-10-CM | POA: Diagnosis not present

## 2024-01-08 DIAGNOSIS — M75102 Unspecified rotator cuff tear or rupture of left shoulder, not specified as traumatic: Secondary | ICD-10-CM | POA: Diagnosis not present

## 2024-01-08 NOTE — Telephone Encounter (Signed)
 Need for holding coumadin  was not requested by surgeon. Please clarify so that we can get pharmacy input. Thank you!

## 2024-01-08 NOTE — Telephone Encounter (Signed)
   Pre-operative Risk Assessment    Patient Name: Kelly [name]  DOB: 08-01-1955 MRN: [id number]      Request for Surgical Clearance    Procedure:  lf reverse total shoulder arthroplasty   Date of Surgery:  Clearance TBD                                 Surgeon:  Dr. Franky Pointer Surgeon's Group or Practice Name:  [facility]  Phone number:  [phone] Fax number:  [phone]   Type of Clearance Requested:   - Medical    Type of Anesthesia:  General    Additional requests/questions:    [name]   01/08/2024, 3:32 PM

## 2024-01-08 NOTE — Telephone Encounter (Signed)
 Surgeon will need coumadin  hold recommendations.

## 2024-01-08 NOTE — Telephone Encounter (Signed)
 Pharmacy please advise on holding coumadin  prior to reverse total shoulder arthroplasty  scheduled for TBD. Last labs (CBC and CMET) 10/19/2023. Thank you.

## 2024-01-11 ENCOUNTER — Ambulatory Visit: Payer: Self-pay | Admitting: Cardiology

## 2024-01-11 ENCOUNTER — Ambulatory Visit (INDEPENDENT_AMBULATORY_CARE_PROVIDER_SITE_OTHER): Admitting: *Deleted

## 2024-01-11 ENCOUNTER — Ambulatory Visit: Payer: Medicare PPO | Attending: Cardiology

## 2024-01-11 DIAGNOSIS — D6869 Other thrombophilia: Secondary | ICD-10-CM | POA: Diagnosis not present

## 2024-01-11 DIAGNOSIS — I480 Paroxysmal atrial fibrillation: Secondary | ICD-10-CM

## 2024-01-11 DIAGNOSIS — I350 Nonrheumatic aortic (valve) stenosis: Secondary | ICD-10-CM

## 2024-01-11 DIAGNOSIS — Z5181 Encounter for therapeutic drug level monitoring: Secondary | ICD-10-CM | POA: Diagnosis not present

## 2024-01-11 LAB — ECHOCARDIOGRAM COMPLETE
AR max vel: 1.33 cm2
AV Area VTI: 1.45 cm2
AV Area mean vel: 1.48 cm2
AV Mean grad: 17 mmHg
AV Peak grad: 37.5 mmHg
Ao pk vel: 3.06 m/s
Area-P 1/2: 2.18 cm2
Calc EF: 48.5 %
Est EF: 45
MV VTI: 3.62 cm2
S' Lateral: 4.3 cm
Single Plane A2C EF: 46.5 %
Single Plane A4C EF: 44.5 %

## 2024-01-11 LAB — POCT INR: INR: 1.8 — AB (ref 2.0–3.0)

## 2024-01-11 NOTE — Patient Instructions (Signed)
 Increase warfarin to 1 1/2 tablets on tablet daily except 1 tablet on Sundays, Recheck in 3wks

## 2024-01-11 NOTE — Progress Notes (Signed)
 INR 1.8. Please see anticoagulation encounter

## 2024-01-12 NOTE — Telephone Encounter (Signed)
 Patient with diagnosis of afib and lupus anticoagulant positive on warfarin for anticoagulation.    Procedure: reverse total shoulder arthroplasty  Date of procedure: TBD   CHA2DS2-VASc Score = 5   This indicates a 7.2% annual risk of stroke. The patient's score is based upon: CHF History: 1 HTN History: 1 Diabetes History: 1 Stroke History: 0 Vascular Disease History: 1 Age Score: 1 Gender Score: 0      Patient with recurrent DVT  CrCl 77 ml/min Platelet count 112  Patient has not had an Afib/aflutter ablation within the last 3 months or DCCV within the last 30 days  Per office protocol, patient can hold warfarin for 5 days prior to procedure.    Patient WILL need bridging with Lovenox  (enoxaparin ) around procedure.  **This guidance is not considered finalized until pre-operative APP has relayed final recommendations.**

## 2024-01-13 NOTE — Telephone Encounter (Signed)
 Noted.  Surgical date pending.

## 2024-01-13 NOTE — Telephone Encounter (Signed)
 Spoke with pt spouse. Pt is going to have preop clearance addressed at the IN OFFICE appt 01/25/24 with Jayson Sierras, MD.  Appt notes have been updated with preop clearance needed.

## 2024-01-13 NOTE — Telephone Encounter (Signed)
   Name: Kelly [name]  DOB: 1955/12/10  MRN: [id number]  Primary Cardiologist: Jayson Sierras, MD   Preoperative team, please contact this patient and set up a phone call appointment for further preoperative risk assessment. Please obtain consent and complete medication review. Thank you for your help.  I confirm that guidance regarding antiplatelet and oral anticoagulation therapy has been completed and, if necessary, noted below.  Per office protocol, patient can hold warfarin for 5 days prior to procedure.     Patient WILL need bridging with Lovenox  (enoxaparin ) around procedure.  I also confirmed the patient resides in the state of [place] . As per [facility] Medical Board telemedicine laws, the patient must reside in the state in which the provider is licensed.   Wyn Raddle, Jackee Shove, NP 01/13/2024, 9:01 AM [facility]

## 2024-01-14 ENCOUNTER — Inpatient Hospital Stay: Payer: Medicare PPO | Attending: Oncology

## 2024-01-14 DIAGNOSIS — D509 Iron deficiency anemia, unspecified: Secondary | ICD-10-CM | POA: Diagnosis not present

## 2024-01-14 DIAGNOSIS — C8215 Follicular lymphoma grade II, lymph nodes of inguinal region and lower limb: Secondary | ICD-10-CM | POA: Diagnosis not present

## 2024-01-14 DIAGNOSIS — D649 Anemia, unspecified: Secondary | ICD-10-CM

## 2024-01-14 DIAGNOSIS — C8590 Non-Hodgkin lymphoma, unspecified, unspecified site: Secondary | ICD-10-CM

## 2024-01-14 LAB — CBC WITH DIFFERENTIAL/PLATELET
Abs Granulocyte: 2.8 K/uL (ref 1.5–6.5)
Abs Immature Granulocytes: 0.01 K/uL (ref 0.00–0.07)
Basophils Absolute: 0.1 K/uL (ref 0.0–0.1)
Basophils Relative: 1 %
Eosinophils Absolute: 0.2 K/uL (ref 0.0–0.5)
Eosinophils Relative: 4 %
HCT: 39.1 % (ref 39.0–52.0)
Hemoglobin: 13 g/dL (ref 13.0–17.0)
Immature Granulocytes: 0 %
Lymphocytes Relative: 28 %
Lymphs Abs: 1.3 K/uL (ref 0.7–4.0)
MCH: 33.1 pg (ref 26.0–34.0)
MCHC: 33.2 g/dL (ref 30.0–36.0)
MCV: 99.5 fL (ref 80.0–100.0)
Monocytes Absolute: 0.4 K/uL (ref 0.1–1.0)
Monocytes Relative: 8 %
Neutro Abs: 2.8 K/uL (ref 1.7–7.7)
Neutrophils Relative %: 59 %
Platelets: 115 K/uL — ABNORMAL LOW (ref 150–400)
RBC: 3.93 MIL/uL — ABNORMAL LOW (ref 4.22–5.81)
RDW: 11.5 % (ref 11.5–15.5)
Smear Review: NORMAL
WBC: 4.8 K/uL (ref 4.0–10.5)
nRBC: 0 % (ref 0.0–0.2)

## 2024-01-14 LAB — IRON AND TIBC
Iron: 106 ug/dL (ref 45–182)
Saturation Ratios: 42 % — ABNORMAL HIGH (ref 17.9–39.5)
TIBC: 251 ug/dL (ref 250–450)
UIBC: 145 ug/dL

## 2024-01-14 LAB — COMPREHENSIVE METABOLIC PANEL WITH GFR
ALT: 36 U/L (ref 0–44)
AST: 42 U/L — ABNORMAL HIGH (ref 15–41)
Albumin: 3.4 g/dL — ABNORMAL LOW (ref 3.5–5.0)
Alkaline Phosphatase: 75 U/L (ref 38–126)
Anion gap: 10 (ref 5–15)
BUN: 17 mg/dL (ref 8–23)
CO2: 24 mmol/L (ref 22–32)
Calcium: 8.9 mg/dL (ref 8.9–10.3)
Chloride: 104 mmol/L (ref 98–111)
Creatinine, Ser: 0.85 mg/dL (ref 0.61–1.24)
GFR, Estimated: >60 mL/min (ref >60)
Glucose, Bld: 220 mg/dL — ABNORMAL HIGH (ref 70–99)
Potassium: 3.8 mmol/L (ref 3.5–5.1)
Sodium: 138 mmol/L (ref 135–145)
Total Bilirubin: 0.8 mg/dL (ref 0.0–1.2)
Total Protein: 6.7 g/dL (ref 6.5–8.1)

## 2024-01-14 LAB — FERRITIN: Ferritin: 213 ng/mL (ref 24–336)

## 2024-01-14 LAB — LACTATE DEHYDROGENASE: LDH: 180 U/L (ref 98–192)

## 2024-01-21 ENCOUNTER — Inpatient Hospital Stay: Payer: Medicare PPO | Attending: Hematology | Admitting: Oncology

## 2024-01-21 VITALS — BP 117/63 | HR 59 | Temp 97.6°F | Resp 20 | Wt 214.5 lb

## 2024-01-21 DIAGNOSIS — Z86718 Personal history of other venous thrombosis and embolism: Secondary | ICD-10-CM | POA: Diagnosis not present

## 2024-01-21 DIAGNOSIS — Z950 Presence of cardiac pacemaker: Secondary | ICD-10-CM | POA: Diagnosis not present

## 2024-01-21 DIAGNOSIS — D649 Anemia, unspecified: Secondary | ICD-10-CM

## 2024-01-21 DIAGNOSIS — R760 Raised antibody titer: Secondary | ICD-10-CM

## 2024-01-21 DIAGNOSIS — R059 Cough, unspecified: Secondary | ICD-10-CM | POA: Insufficient documentation

## 2024-01-21 DIAGNOSIS — D6862 Lupus anticoagulant syndrome: Secondary | ICD-10-CM | POA: Insufficient documentation

## 2024-01-21 DIAGNOSIS — Z8572 Personal history of non-Hodgkin lymphomas: Secondary | ICD-10-CM | POA: Diagnosis not present

## 2024-01-21 DIAGNOSIS — D509 Iron deficiency anemia, unspecified: Secondary | ICD-10-CM | POA: Diagnosis not present

## 2024-01-21 DIAGNOSIS — C8590 Non-Hodgkin lymphoma, unspecified, unspecified site: Secondary | ICD-10-CM

## 2024-01-21 DIAGNOSIS — R0602 Shortness of breath: Secondary | ICD-10-CM | POA: Insufficient documentation

## 2024-01-21 DIAGNOSIS — D696 Thrombocytopenia, unspecified: Secondary | ICD-10-CM | POA: Diagnosis not present

## 2024-01-21 NOTE — Assessment & Plan Note (Addendum)
-   No B symptoms or infections in the last 6 months. - No lymphadenopathy or palpable splenomegaly. - Labs from 01/14/2024: LDH WNL. AST slightly elevated at 42. -Ultrasound of liver from 10/09/2023 showed cholelithiasis without sonographic evidence of acute cholecystitis and fatty infiltration of the liver.  He more recently had a CT abdomen pelvis on 12/03/2023 which did not reveal evidence of recurrent lymphoma or acute findings.  Small fat-containing periumbilical hernia, stable chronic mesenteric panniculitis, colonic diverticulosis, cholelithiasis and hepatic morphology suspicious for cirrhosis.  No evidence of hepatic neoplasm. - Recommend follow-up in 1 year.

## 2024-01-21 NOTE — Assessment & Plan Note (Addendum)
-   No anemia at most recent lab draw.  Iron levels show iron sats of 42% along with normal TIBC.  Hemoglobin 13.0.  Ferritin 213. - Continue iron tablet daily.

## 2024-01-21 NOTE — Progress Notes (Signed)
 [facility] OFFICE PROGRESS NOTE  Shona Norleen PEDLAR, MD  ASSESSMENT & PLAN:   Assessment & Plan Non-Hodgkin's lymphoma, unspecified body region, unspecified non-Hodgkin lymphoma type (HCC) - No B symptoms or infections in the last 6 months. - No lymphadenopathy or palpable splenomegaly. - Labs from 01/14/2024: LDH WNL. AST slightly elevated at 42. -Ultrasound of liver from 10/09/2023 showed cholelithiasis without sonographic evidence of acute cholecystitis and fatty infiltration of the liver.  He more recently had a CT abdomen pelvis on 12/03/2023 which did not reveal evidence of recurrent lymphoma or acute findings.  Small fat-containing periumbilical hernia, stable chronic mesenteric panniculitis, colonic diverticulosis, cholelithiasis and hepatic morphology suspicious for cirrhosis.  No evidence of hepatic neoplasm. - Recommend follow-up in 1 year. Iron deficiency anemia, unspecified iron deficiency anemia type - No anemia at most recent lab draw.  Iron levels show iron sats of 42% along with normal TIBC.  Hemoglobin 13.0.  Ferritin 213. - Continue iron tablet daily.  Thrombocytopenia (HCC) -Mild thrombocytopenia since 2008 has been stable with no bleeding issues.  Likely immune mediated. -Most recent platelet count was 115.   Lupus anticoagulant positive -He is taking Coumadin  indefinitely.  Recently his INR has been erratic.  He is reportedly taking more Tylenol .  He had 1 episode of rectal bleed.  I have told him to cut back on it.   Orders Placed This Encounter  Procedures   Iron and TIBC    Standing Status:   Future    Expected Date:   01/20/2025    Expiration Date:   04/20/2025    Release to patient:   Immediate   CBC with Differential/Platelet    Standing Status:   Future    Expected Date:   01/20/2025    Expiration Date:   04/20/2025    Release to patient:   Immediate   Comprehensive metabolic panel    Standing Status:   Future    Expected Date:   01/20/2025     Expiration Date:   04/20/2025    Release to patient:   Immediate   Ferritin    Standing Status:   Future    Expected Date:   01/20/2025    Expiration Date:   04/20/2025    Release to patient:   Immediate   Lactate dehydrogenase    Standing Status:   Future    Expected Date:   01/20/2025    Expiration Date:   04/20/2025    Release to patient:   Immediate    INTERVAL HISTORY: Patient returns for follow-up for thrombocytopenia, anemia and non-Hodgkin's lymphoma.  Had pacemaker replaced about 2 years ago.  Patient met with gastroenterology on 10/19/2023 for abdominal pain and diarrhea.  Patient has history of gastric bypass.  He is down greater than 150 pounds.  Colonoscopy from September 2024 without abnormality. Had CT abdomen/pelvis which did not reveal evidence of recurrent lymphoma or acute findings.  He also had an ultrasound on 10/09/2023 which showed fatty infiltration of the liver.  Pancreatic insufficiency testing to assess for EPI given history of diabetes.  He was instructed to continue Imodium.  Started on pancreatic enzymes which appears to be helping.  He continues to deny any bleeding, bright red blood per rectum or melena.  He denies any hospitalizations, surgeries or changes to baseline health.  He had a colonoscopy in 2024.  He is currently still on Coumadin .  His most recent INR is 1.8 from 01/11/2024.  Patient will be having  shoulder surgery in the near future and will be bridging with Lovenox .  This is already been discussed with cardiology.  Reports cough and shortness of breath at times.  Patient has diarrhea which has improved since he met with gastroenterology.  Has numbness and tingling in hands and feet.  Appetite 85% energy levels are 50%.  We reviewed cbc, iron, ferritin, ldh.  SUMMARY OF HEMATOLOGIC HISTORY: Oncology History   No history exists.    1.  Low-grade follicular lymphoma: - Right inguinal lymph node excision biopsy on 09/15/2005 consistent with grade 2  follicular lymphoma, mixed cell type.  Cells are positive for Bcl-2, CD20 and CD79a.  CD10 is negative. - He was under watchful observation and did not require any treatment over the years. - Last CT CAP on 05/29/2017 at [facility] showed enlarged mediastinal nodes, largest 1.7 x 2.4 cm right infrahilar node.  Prominent but not enlarged abdominal retroperitoneal nodes.  Right middle lobe 7 mm nodule.  Fatty cirrhotic liver.  Normal spleen.   2.  Social/family history: - He works as a Music therapist at [employer].  He quit smoking in 1995.  He smoked 1 pack/day for 20+ years. - Paternal grandmother had pancreatic cancer.  Maternal grandmother died of CVA in her 51s.  Father was on blood thinners.   3.  Normocytic anemia: - Last colonoscopy was in 2008 which showed normal colon with diverticulosis on the left side. - EGD on 03/21/2015 showed nodularity and ulcerations and atrophy seen in prepyloric antrum.  EUS showed thickened gastric wall in the prepyloric antrum with no mass.  4.  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  No history of pulmonary embolism.  CBC    Component Value Date/Time   WBC 4.8 01/14/2024 0805   RBC 3.93 (L) 01/14/2024 0805   HGB 13.0 01/14/2024 0805   HGB 14.5 10/19/2023 1127   HCT 39.1 01/14/2024 0805   HCT 43.6 10/19/2023 1127   PLT 115 (L) 01/14/2024 0805   PLT 112 (L) 10/19/2023 1127   MCV 99.5 01/14/2024 0805   MCV 101 (H) 10/19/2023 1127   MCH 33.1 01/14/2024 0805   MCHC 33.2 01/14/2024 0805   RDW 11.5 01/14/2024 0805   RDW 12.1 10/19/2023 1127   LYMPHSABS 1.3 01/14/2024 0805   LYMPHSABS 1.2 11/07/2022 0856   MONOABS 0.4 01/14/2024 0805   EOSABS 0.2 01/14/2024 0805   EOSABS 0.3 11/07/2022 0856   BASOSABS 0.1 01/14/2024 0805   BASOSABS 0.1 11/07/2022 0856       Latest Ref Rng & Units 01/14/2024    8:05 AM 12/03/2023    4:00 PM 10/19/2023   11:27 AM  CMP  Glucose 70 - 99 mg/dL 779   882   BUN 8 - 23 mg/dL 17   12    Creatinine 9.38 - 1.24 mg/dL 9.14  8.79  8.97   Sodium 135 - 145 mmol/L 138   144   Potassium 3.5 - 5.1 mmol/L 3.8   4.3   Chloride 98 - 111 mmol/L 104   105   CO2 22 - 32 mmol/L 24   23   Calcium  8.9 - 10.3 mg/dL 8.9   9.2   Total Protein 6.5 - 8.1 g/dL 6.7   6.9   Total Bilirubin 0.0 - 1.2 mg/dL 0.8   0.5   Alkaline Phos 38 - 126 U/L 75   90   AST 15 - 41 U/L 42   45  ALT 0 - 44 U/L 36   35      Lab Results  Component Value Date   FERRITIN 213 01/14/2024   VITAMINB12 927 (H) 01/08/2023    Vitals:   01/21/24 0922  BP: 117/63  Pulse: (!) 59  Resp: 20  Temp: 97.6 F (36.4 C)  SpO2: 98%    Review of System:  Review of Systems  Constitutional:  Positive for malaise/fatigue and weight loss.  Respiratory:  Positive for cough and shortness of breath.   Gastrointestinal:  Positive for diarrhea.  Musculoskeletal:  Positive for joint pain.  Neurological:  Positive for dizziness and headaches.    Physical Exam: Physical Exam Constitutional:      Appearance: Normal appearance.  HENT:     Head: Normocephalic and atraumatic.  Eyes:     Pupils: Pupils are equal, round, and reactive to light.  Cardiovascular:     Rate and Rhythm: Normal rate and regular rhythm.     Heart sounds: Normal heart sounds. No murmur heard. Pulmonary:     Effort: Pulmonary effort is normal.     Breath sounds: Normal breath sounds. No wheezing.  Abdominal:     General: Bowel sounds are normal. There is no distension.     Palpations: Abdomen is soft.     Tenderness: There is no abdominal tenderness.  Musculoskeletal:        General: Normal range of motion.     Cervical back: Normal range of motion.  Skin:    General: Skin is warm and dry.     Findings: No rash.  Neurological:     Mental Status: He is alert and oriented to person, place, and time.     Gait: Gait is intact.  Psychiatric:        Mood and Affect: Mood and affect normal.        Cognition and Memory: Memory normal.         Judgment: Judgment normal.      I spent 25 minutes dedicated to the care of this patient (face-to-face and non-face-to-face) on the date of the encounter to include what is described in the assessment and plan.,  Delon Hope, NP 01/21/2024 9:33 AM

## 2024-01-21 NOTE — Assessment & Plan Note (Addendum)
-  Mild thrombocytopenia since 2008 has been stable with no bleeding issues.  Likely immune mediated. -Most recent platelet count was 115.

## 2024-01-21 NOTE — Assessment & Plan Note (Addendum)
-  He is taking Coumadin  indefinitely.  Recently his INR has been erratic.  He is reportedly taking more Tylenol .  He had 1 episode of rectal bleed.  I have told him to cut back on it.

## 2024-01-25 ENCOUNTER — Encounter: Payer: Self-pay | Admitting: Cardiology

## 2024-01-25 ENCOUNTER — Ambulatory Visit: Attending: Cardiology | Admitting: Cardiology

## 2024-01-25 VITALS — BP 110/62 | HR 62 | Ht 76.0 in | Wt 213.2 lb

## 2024-01-25 DIAGNOSIS — Z95810 Presence of automatic (implantable) cardiac defibrillator: Secondary | ICD-10-CM | POA: Diagnosis not present

## 2024-01-25 DIAGNOSIS — I25119 Atherosclerotic heart disease of native coronary artery with unspecified angina pectoris: Secondary | ICD-10-CM | POA: Diagnosis not present

## 2024-01-25 DIAGNOSIS — I2510 Atherosclerotic heart disease of native coronary artery without angina pectoris: Secondary | ICD-10-CM

## 2024-01-25 DIAGNOSIS — Z01810 Encounter for preprocedural cardiovascular examination: Secondary | ICD-10-CM | POA: Diagnosis not present

## 2024-01-25 DIAGNOSIS — I5020 Unspecified systolic (congestive) heart failure: Secondary | ICD-10-CM | POA: Diagnosis not present

## 2024-01-25 DIAGNOSIS — I480 Paroxysmal atrial fibrillation: Secondary | ICD-10-CM

## 2024-01-25 NOTE — Patient Instructions (Signed)
 Medication Instructions:  Your physician recommends that you continue on your current medications as directed. Please refer to the Current Medication list given to you today.   Labwork: None today  Testing/Procedures: None today  Follow-Up: 6 months  Any Other Special Instructions Will Be Listed Below (If Applicable).  If you need a refill on your cardiac medications before your next appointment, please call your pharmacy.

## 2024-01-25 NOTE — Progress Notes (Signed)
 Cardiology Office Note  Date: 01/25/2024   ID: RISHAAN GUNNER, DOB [birth year], MRN [id number]  History of Present Illness: Kelly [name] is a 68 y.o. male last seen in February.  He is here today with his wife for a follow-up visit.  Overall stable from a cardiac perspective, he reports using nitroglycerin  twice in the last 6 months.  Stable NYHA class II dyspnea and no obvious fluid retention or weight gain.  No palpitations or syncope.  He is planning reverse total shoulder arthroplasty under general anesthesia with Dr. Melita, Coumadin  hold requested.  He will need Lovenox  bridging through the anticoagulation clinic as already reviewed in the chart.  RCRI perioperative cardiac risk index is 3 points, 10% chance of major adverse cardiac events (intermediate).  St. Jude CRT-D in place with follow-up by Dr. Cindie.  Device interrogation in June revealed normal function.  No device shocks.  Medications reviewed.  He continues on Coumadin  with follow-up in the anticoagulation clinic.  Cardiac regimen otherwise stable.  Recent follow-up echocardiogram in August showed relatively stable LVEF at 45% and moderate aortic stenosis without significant progression.  I reviewed his ECG today which shows dual-chamber pacing.  Physical Exam: VS:  BP 110/62 (BP Location: Right Arm, Cuff Size: Normal)   Pulse 62   Ht 6' 4 (1.93 m)   Wt 213 lb 3.2 oz (96.7 kg)   SpO2 97%   BMI 25.95 kg/m , BMI Body mass index is 25.95 kg/m.  Wt Readings from Last 3 Encounters:  01/25/24 213 lb 3.2 oz (96.7 kg)  01/21/24 214 lb 8.1 oz (97.3 kg)  10/19/23 214 lb (97.1 kg)    General: Patient appears comfortable at rest. HEENT: Conjunctiva and lids normal. Neck: Supple, no elevated JVP or carotid bruits. Lungs: Clear to auscultation, nonlabored breathing at rest. Cardiac: Regular rate and rhythm, no S3, 2/6 systolic murmur, no pericardial rub. Extremities: No pitting edema.  ECG:  An ECG dated  12/22/2022 was personally reviewed today and demonstrated:  Dual-chamber pacing.  Labwork: 01/14/2024: ALT 36; AST 42; BUN 17; Creatinine, Ser 0.85; Hemoglobin 13.0; Platelets 115; Potassium 3.8; Sodium 138  August 2025: Cholesterol 96, triglycerides 99, HDL 38, LDL 39  Other Studies Reviewed Today:  Echocardiogram 01/11/2024:  1. Left ventricular ejection fraction, by estimation, is 45%. Left  ventricular ejection fraction by 3D volume is 55 %. The left ventricle has  mildly decreased function. The left ventricle has no regional wall motion  abnormalities. Left ventricular  diastolic function could not be evaluated. The average left ventricular  global longitudinal strain is -12.9 %.   2. Right ventricular systolic function is normal. The right ventricular  size is mildly enlarged. There is normal pulmonary artery systolic  pressure.   3. The mitral valve is normal in structure. Trivial mitral valve  regurgitation. No evidence of mitral stenosis.   4. The aortic valve is tricuspid. There is moderate calcification of the  aortic valve. There is moderate thickening of the aortic valve. Aortic  valve regurgitation is trivial. Moderate aortic valve stenosis. Aortic  valve area, by VTI measures 1.45 cm.  Aortic valve mean gradient measures 17.0 mmHg. Aortic valve Vmax measures  3.06 m/s. DVI is 0.29.   5. The inferior vena cava is normal in size with greater than 50%  respiratory variability, suggesting right atrial pressure of 3 mmHg.   Assessment and Plan:  1.  CAD status post BMS to the LAD and angioplasty of the RCA in 2000,  BMS to the RCA/PLA in 2001, and ultimately CABG in 2018 with a LIMA to LAD, SVG to OM 2, and SVG to PDA.  Stable angina with no increasing nitroglycerin  use.  Continue aspirin  81 mg daily, Imdur  15 mg daily, and Crestor  20 mg daily.  2.  Preoperative cardiac assessment prior to planned reverse total shoulder arthroplasty under general anesthesia with Dr. Melita.   RCRI perioperative cardiac risk index is 3 points, 10% chance of major adverse cardiac events (intermediate).  He should be able to proceed without additional cardiac testing.  Coumadin  hold 5 days prior as already indicated with plan for Lovenox  bridge which can be coordinated through our anticoagulation clinic.  Surgical date not yet set.   3.  HFmrEF, follow-up echocardiogram in August reveals LVEF approximately 45%.  GDMT limited by orthostatic symptoms and relatively low blood pressure at baseline.  Clinically stable with NYHA class II symptoms and no fluid retention.  Continue Entresto  24/26 mg twice daily and Farxiga 10 mg daily.   4.  Moderate aortic stenosis, follow-up echocardiogram in August shows mean AV gradient 17 mmHg and dimensionless index 0.29.  Plan on repeat imaging next August.   5.  St. Jude CRT-D in place with follow-up by Dr. Cindie in June 2024.  No device shocks or syncope.   6.  Paroxysmal atrial fibrillation with CHA2DS2-VASc score of 4.  He is on Coumadin  with follow-up in the anticoagulation clinic.  Asymptomatic in terms of palpitations and not on AV nodal blocker.   7.  Mixed hyperlipidemia.  LDL 39 in August.  Continue Crestor  20 mg daily.  Disposition:  Follow up 6 months.  Signed, Jayson JUDITHANN Sierras, M.D., F.A.C.C. [facility]

## 2024-02-02 ENCOUNTER — Ambulatory Visit: Attending: Cardiology | Admitting: *Deleted

## 2024-02-02 DIAGNOSIS — D6869 Other thrombophilia: Secondary | ICD-10-CM

## 2024-02-02 DIAGNOSIS — I480 Paroxysmal atrial fibrillation: Secondary | ICD-10-CM | POA: Diagnosis not present

## 2024-02-02 DIAGNOSIS — Z5181 Encounter for therapeutic drug level monitoring: Secondary | ICD-10-CM

## 2024-02-02 LAB — POCT INR: INR: 2.8 (ref 2.0–3.0)

## 2024-02-02 NOTE — Patient Instructions (Addendum)
 Continue warfarin 1 1/2 tablets on tablet daily except 1 tablet on Sundays, Recheck in 4 wks Pending shoulder surgery.  Date TBD  Will need Lovenox  bridge.

## 2024-02-02 NOTE — Progress Notes (Signed)
 INR-2.8 Please see anticoagulation encounter

## 2024-02-03 ENCOUNTER — Telehealth: Payer: Self-pay

## 2024-02-03 NOTE — Telephone Encounter (Signed)
   Pre-operative Risk Assessment    Patient Name: Kelly [name]  DOB: 1956/01/14 MRN: [id number]   Date of last office visit: 01/25/24 [facility] MCDOWELL, MD Date of next office visit: NONE   Request for Surgical Clearance    Procedure:  LEFT REVERSE TOTAL SHOULDER ARTHROPLASTY  Date of Surgery:  Clearance TBD                                  Surgeon:  DR FRANKY SUPPLE Surgeon's Group or Practice Name:  [facility] Phone number:  [phone] Fax number:  [phone]  ATTN: KATHERYN STONE   Type of Clearance Requested:   - Medical  - Pharmacy:  Hold Aspirin  and Warfarin (Coumadin )     Type of Anesthesia:  General    Additional requests/questions:    SignedLucie DELENA Ku   02/03/2024, 5:44 PM

## 2024-02-08 DIAGNOSIS — M75102 Unspecified rotator cuff tear or rupture of left shoulder, not specified as traumatic: Secondary | ICD-10-CM | POA: Diagnosis not present

## 2024-02-08 DIAGNOSIS — M12812 Other specific arthropathies, not elsewhere classified, left shoulder: Secondary | ICD-10-CM | POA: Diagnosis not present

## 2024-02-08 NOTE — Telephone Encounter (Signed)
 Noted.  Will manage when procedure has been scheduled.

## 2024-02-08 NOTE — Telephone Encounter (Signed)
 Patient with diagnosis of atrial fibrillation and recurrent DVT (APS) on warfarin for anticoagulation.    Procedure: left reverse total shoulder arthroplasty Date of procedure: TBD   CHA2DS2-VASc Score = 5   This indicates a 7.2% annual risk of stroke. The patient's score is based upon: CHF History: 1 HTN History: 1 Diabetes History: 1 Stroke History: 0 Vascular Disease History: 1 Age Score: 1 Gender Score: 0      CrCl > 100 mL/min (Scr 0.85, weight 96.7 kg) Platelet count 115K (01/14/24)  Patient has not had an Afib/aflutter ablation or Watchman within the last 3 months or DCCV within the last 30 days   Per office protocol, patient can hold warfarin for 5 days prior to procedure.    Patient will need bridging with Lovenox  (enoxaparin ) around procedure.  **This guidance is not considered finalized until pre-operative APP has relayed final recommendations.**

## 2024-02-09 NOTE — Telephone Encounter (Signed)
   Patient Name: Kelly [name]  DOB: 1955/11/07 MRN: [id number]  Primary Cardiologist: Jayson Sierras, MD  Chart reviewed as part of pre-operative protocol coverage. Patient was recently seen on by Dr. Sierras on 01/25/2024 at which time pre-op  evaluation was addressed. Per his note: Preoperative cardiac assessment prior to planned reverse total shoulder arthroplasty under general anesthesia with Dr. Melita. RCRI perioperative cardiac risk index is 3 points, 10% chance of major adverse cardiac events (intermediate). He should be able to proceed without additional cardiac testing. Coumadin  hold 5 days prior as already indicated with plan for Lovenox  bridge which can be coordinated through our anticoagulation clinic. Surgical date not yet set.  I will route this recommendation to the requesting party via Epic fax function and remove from pre-op  pool.  Please call with questions.  Lisette Mancebo E Lore Polka, PA-C 02/09/2024, 8:21 AM

## 2024-02-11 DIAGNOSIS — E113293 Type 2 diabetes mellitus with mild nonproliferative diabetic retinopathy without macular edema, bilateral: Secondary | ICD-10-CM | POA: Diagnosis not present

## 2024-02-15 ENCOUNTER — Ambulatory Visit (INDEPENDENT_AMBULATORY_CARE_PROVIDER_SITE_OTHER): Payer: Medicare PPO

## 2024-02-15 DIAGNOSIS — I480 Paroxysmal atrial fibrillation: Secondary | ICD-10-CM

## 2024-02-16 LAB — CUP PACEART REMOTE DEVICE CHECK
Battery Remaining Longevity: 50 mo
Battery Remaining Percentage: 77 %
Battery Voltage: 2.99 V
Brady Statistic AP VP Percent: 61 %
Brady Statistic AP VS Percent: 1 %
Brady Statistic AS VP Percent: 38 %
Brady Statistic AS VS Percent: 1 %
Brady Statistic RA Percent Paced: 60 %
Date Time Interrogation Session: 20250929020016
HighPow Impedance: 54 Ohm
HighPow Impedance: 54 Ohm
Implantable Lead Connection Status: 753985
Implantable Lead Connection Status: 753985
Implantable Lead Connection Status: 753985
Implantable Lead Implant Date: 20211025
Implantable Lead Implant Date: 20240628
Implantable Lead Implant Date: 20240628
Implantable Lead Location: 753859
Implantable Lead Location: 753860
Implantable Lead Location: 753860
Implantable Pulse Generator Implant Date: 20240628
Lead Channel Impedance Value: 390 Ohm
Lead Channel Impedance Value: 450 Ohm
Lead Channel Impedance Value: 460 Ohm
Lead Channel Pacing Threshold Amplitude: 0.75 V
Lead Channel Pacing Threshold Amplitude: 1 V
Lead Channel Pacing Threshold Amplitude: 2.5 V
Lead Channel Pacing Threshold Pulse Width: 0.5 ms
Lead Channel Pacing Threshold Pulse Width: 0.5 ms
Lead Channel Pacing Threshold Pulse Width: 1 ms
Lead Channel Sensing Intrinsic Amplitude: 2.2 mV
Lead Channel Sensing Intrinsic Amplitude: 9 mV
Lead Channel Setting Pacing Amplitude: 0.25 V
Lead Channel Setting Pacing Amplitude: 2 V
Lead Channel Setting Pacing Amplitude: 3 V
Lead Channel Setting Pacing Pulse Width: 0.05 ms
Lead Channel Setting Pacing Pulse Width: 0.5 ms
Lead Channel Setting Sensing Sensitivity: 0.5 mV
Pulse Gen Serial Number: 5563291
Zone Setting Status: 755011

## 2024-02-17 NOTE — Progress Notes (Signed)
 Remote ICD Transmission

## 2024-02-18 ENCOUNTER — Telehealth: Payer: Self-pay | Admitting: Cardiology

## 2024-02-18 ENCOUNTER — Ambulatory Visit: Payer: Self-pay | Admitting: Cardiology

## 2024-02-18 NOTE — Telephone Encounter (Signed)
 Pt calling to ask Kelly [name] if he could come in 10-15 minutes early for his appt 10/14. Please advise.

## 2024-02-22 NOTE — Telephone Encounter (Signed)
 Sent patient a message that it is OK to come in early.

## 2024-03-01 ENCOUNTER — Ambulatory Visit: Attending: Cardiology | Admitting: *Deleted

## 2024-03-01 DIAGNOSIS — I480 Paroxysmal atrial fibrillation: Secondary | ICD-10-CM

## 2024-03-01 DIAGNOSIS — Z5181 Encounter for therapeutic drug level monitoring: Secondary | ICD-10-CM

## 2024-03-01 LAB — POCT INR: INR: 2.7 (ref 2.0–3.0)

## 2024-03-01 NOTE — Progress Notes (Signed)
 INR 2.7; Please see anticoagulation encounter

## 2024-03-01 NOTE — Patient Instructions (Signed)
 Continue warfarin 1 1/2 tablets on tablet daily except 1 tablet on Sundays, Recheck in 4 wks Pending shoulder surgery.  Date TBD  Will need Lovenox  bridge.

## 2024-03-08 ENCOUNTER — Ambulatory Visit: Payer: BC Managed Care – PPO

## 2024-03-21 ENCOUNTER — Encounter: Payer: Self-pay | Admitting: Radiology

## 2024-03-29 ENCOUNTER — Ambulatory Visit: Attending: Cardiology | Admitting: *Deleted

## 2024-03-29 DIAGNOSIS — I480 Paroxysmal atrial fibrillation: Secondary | ICD-10-CM | POA: Diagnosis not present

## 2024-03-29 DIAGNOSIS — Z5181 Encounter for therapeutic drug level monitoring: Secondary | ICD-10-CM

## 2024-03-29 LAB — POCT INR: INR: 3.9 — AB (ref 2.0–3.0)

## 2024-03-29 NOTE — Progress Notes (Signed)
 INR 3.9. Please see anticoagulation encounter

## 2024-03-29 NOTE — Patient Instructions (Signed)
 Hold warfarin tonight then resume 1 1/2 tablets on tablet daily except 1 tablet on Sundays, Recheck in 2 wks Pending shoulder surgery.  Date 04/21/24  Will need Lovenox  bridge.

## 2024-04-11 ENCOUNTER — Encounter (HOSPITAL_COMMUNITY)
Admission: RE | Admit: 2024-04-11 | Discharge: 2024-04-11 | Disposition: A | Discharge status: Home / Self Care | Source: Ambulatory Visit | Attending: Orthopedic Surgery | Admitting: Orthopedic Surgery

## 2024-04-11 ENCOUNTER — Encounter (HOSPITAL_COMMUNITY)

## 2024-04-11 DIAGNOSIS — K7460 Unspecified cirrhosis of liver: Secondary | ICD-10-CM | POA: Insufficient documentation

## 2024-04-11 DIAGNOSIS — Z7901 Long term (current) use of anticoagulants: Secondary | ICD-10-CM | POA: Diagnosis not present

## 2024-04-11 DIAGNOSIS — Z01818 Encounter for other preprocedural examination: Secondary | ICD-10-CM

## 2024-04-11 DIAGNOSIS — I252 Old myocardial infarction: Secondary | ICD-10-CM | POA: Insufficient documentation

## 2024-04-11 DIAGNOSIS — Z01812 Encounter for preprocedural laboratory examination: Secondary | ICD-10-CM | POA: Diagnosis not present

## 2024-04-11 DIAGNOSIS — M75102 Unspecified rotator cuff tear or rupture of left shoulder, not specified as traumatic: Secondary | ICD-10-CM | POA: Diagnosis not present

## 2024-04-11 DIAGNOSIS — I2510 Atherosclerotic heart disease of native coronary artery without angina pectoris: Secondary | ICD-10-CM | POA: Diagnosis not present

## 2024-04-11 DIAGNOSIS — G4733 Obstructive sleep apnea (adult) (pediatric): Secondary | ICD-10-CM | POA: Insufficient documentation

## 2024-04-11 DIAGNOSIS — Z95810 Presence of automatic (implantable) cardiac defibrillator: Secondary | ICD-10-CM | POA: Insufficient documentation

## 2024-04-11 DIAGNOSIS — I110 Hypertensive heart disease with heart failure: Secondary | ICD-10-CM | POA: Diagnosis not present

## 2024-04-11 DIAGNOSIS — Z951 Presence of aortocoronary bypass graft: Secondary | ICD-10-CM | POA: Diagnosis not present

## 2024-04-11 DIAGNOSIS — E119 Type 2 diabetes mellitus without complications: Secondary | ICD-10-CM | POA: Insufficient documentation

## 2024-04-11 DIAGNOSIS — I4891 Unspecified atrial fibrillation: Secondary | ICD-10-CM | POA: Insufficient documentation

## 2024-04-11 DIAGNOSIS — I10 Essential (primary) hypertension: Secondary | ICD-10-CM

## 2024-04-11 DIAGNOSIS — I352 Nonrheumatic aortic (valve) stenosis with insufficiency: Secondary | ICD-10-CM | POA: Diagnosis not present

## 2024-04-11 DIAGNOSIS — Z8572 Personal history of non-Hodgkin lymphomas: Secondary | ICD-10-CM | POA: Insufficient documentation

## 2024-04-11 LAB — CBC
HCT: 42.8 % (ref 39.0–52.0)
Hemoglobin: 14.1 g/dL (ref 13.0–17.0)
MCH: 32.3 pg (ref 26.0–34.0)
MCHC: 32.9 g/dL (ref 30.0–36.0)
MCV: 97.9 fL (ref 80.0–100.0)
Platelets: 105 K/uL — ABNORMAL LOW (ref 150–400)
RBC: 4.37 MIL/uL (ref 4.22–5.81)
RDW: 11.9 % (ref 11.5–15.5)
WBC: 4.5 K/uL (ref 4.0–10.5)
nRBC: 0 % (ref 0.0–0.2)

## 2024-04-11 LAB — BASIC METABOLIC PANEL WITH GFR
Anion gap: 9 (ref 5–15)
BUN: 14 mg/dL (ref 8–23)
CO2: 28 mmol/L (ref 22–32)
Calcium: 9.2 mg/dL (ref 8.9–10.3)
Chloride: 105 mmol/L (ref 98–111)
Creatinine, Ser: 0.97 mg/dL (ref 0.61–1.24)
GFR, Estimated: >60 mL/min (ref >60)
Glucose, Bld: 142 mg/dL — ABNORMAL HIGH (ref 70–99)
Potassium: 4.6 mmol/L (ref 3.5–5.1)
Sodium: 141 mmol/L (ref 135–145)

## 2024-04-11 LAB — HEMOGLOBIN A1C
Hgb A1c MFr Bld: 7.2 % — ABNORMAL HIGH (ref 4.8–5.6)
Mean Plasma Glucose: 160 mg/dL

## 2024-04-11 LAB — SURGICAL PCR SCREEN
MRSA, PCR: NEGATIVE
Staphylococcus aureus: NEGATIVE

## 2024-04-11 NOTE — Progress Notes (Addendum)
 Date of COVID positive in last 90 days:  PCP - Norleen Hurst, MD  Cardiologist - Jayson Sierras, MD Electrophysiologist- Ole Cindie COME  Cardiac clearance in media tab dated 03/10/24  Chest x-ray - N/A EKG - 12/22/23 Epic Stress Test - 05/16/20 Epic ECHO - 01/11/24 Epic Cardiac Cath - 09/26/16 Epic Pacemaker/ICD device last checked: 02/15/24 Epic- orders requested via Epic Spinal Cord Stimulator:N/A  Bowel Prep - N/A  Sleep Study - N/A CPAP -   Fasting Blood Sugar -  Checks Blood Sugar _____ times a day  Last dose of GLP1 agonist-  N/A GLP1 instructions:  Do not take after     Last dose of SGLT-2 inhibitors-  Farxiga SGLT-2 instructions:  Do not take after  04/17/24  Blood Thinner Instructions: Warfarin, hold 5 days, needs Lovenox  bridge Aspirin  Instructions: ASA 81 Last Dose:  Activity level:  Can go up a flight of stairs and perform activities of daily living without stopping and without symptoms of chest pain or shortness of breath.  Able to exercise without symptoms  Unable to go up a flight of stairs without symptoms of     Anesthesia review: CAD, DM2, HTN, a fib, DVT, Hepatic cirrhosis, CHF, lymphoma   Patient denies shortness of breath, fever, cough and chest pain at PAT appointment  Patient verbalized understanding of instructions that were given to them at the PAT appointment. Patient was also instructed that they will need to review over the PAT instructions again at home before surgery.

## 2024-04-11 NOTE — Patient Instructions (Addendum)
 SURGICAL WAITING ROOM VISITATION Patients having surgery or a procedure may have no more than 2 support people in the waiting area - these visitors may rotate.    Children under the age of 73 must have an adult with them who is not the patient.  If the patient needs to stay at the hospital during part of their recovery, the visitor guidelines for inpatient rooms apply. Pre-op  nurse will coordinate an appropriate time for 1 support person to accompany patient in pre-op .  This support person may not rotate.    Please refer to the [facility] website for the visitor guidelines for Inpatients (after your surgery is over and you are in a regular room).     Your procedure is scheduled on: 04-21-24   Report to [facility] Main Entrance    Report to admitting at 5:15 AM   Call this number if you have problems the morning of surgery [phone]   Do not eat food :After Midnight.   After Midnight you may have the following liquids until 4:30 AM DAY OF SURGERY  Water  Non-Citrus Juices (without pulp, NO RED-Apple, White grape, White cranberry) Black Coffee (NO MILK/CREAM OR CREAMERS, sugar ok)  Clear Tea (NO MILK/CREAM OR CREAMERS, sugar ok) regular and decaf                             Plain Jell-O (NO RED)                                           Fruit ices (not with fruit pulp, NO RED)                                     Popsicles (NO RED)                                                               Sports drinks like Gatorade (NO RED)                   The day of surgery:  Drink ONE (1) Pre-Surgery G2 by 4:30 AM the morning of surgery. Drink in one sitting. Do not sip.  This drink was given to you during your hospital  pre-op  appointment visit. Nothing else to drink after completing the Pre-Surgery G2.          If you have questions, please contact your surgeon's office.   FOLLOW  ANY ADDITIONAL PRE OP INSTRUCTIONS YOU RECEIVED FROM YOUR SURGEON'S OFFICE!!!     Oral  Hygiene is also important to reduce your risk of infection.                                    Remember - BRUSH YOUR TEETH THE MORNING OF SURGERY WITH YOUR REGULAR TOOTHPASTE   Do NOT smoke after Midnight   Take these medicines the morning of surgery with A SIP OF WATER :    Fluoxetine    Isosorbide    Pantoprazole   Rosuvastatin    If needed Tylenol , Oxycodone , Meclizine    Hold Coumadin  as advised by physician  Stop all vitamins and herbal supplements 7 days before surgery  How to Manage Your Diabetes Before and After Surgery  Why is it important to control my blood sugar before and after surgery? Improving blood sugar levels before and after surgery helps healing and can limit problems. A way of improving blood sugar control is eating a healthy diet by:  Eating less sugar and carbohydrates  Increasing activity/exercise  Talking with your doctor about reaching your blood sugar goals High blood sugars (greater than 180 mg/dL) can raise your risk of infections and slow your recovery, so you will need to focus on controlling your diabetes during the weeks before surgery. Make sure that the doctor who takes care of your diabetes knows about your planned surgery including the date and location.  How do I manage my blood sugar before surgery? Check your blood sugar at least 4 times a day, starting 2 days before surgery, to make sure that the level is not too high or low. Check your blood sugar the morning of your surgery when you wake up and every 2 hours until you get to the Short Stay unit. If your blood sugar is less than 70 mg/dL, you will need to treat for low blood sugar: Do not take insulin . Treat a low blood sugar (less than 70 mg/dL) with  cup of clear juice (cranberry or apple), 4 glucose tablets, OR glucose gel. Recheck blood sugar in 15 minutes after treatment (to make sure it is greater than 70 mg/dL). If your blood sugar is not greater than 70 mg/dL on recheck, call  [phone] for further instructions. Report your blood sugar to the short stay nurse when you get to Short Stay.  If you are admitted to the hospital after surgery: Your blood sugar will be checked by the staff and you will probably be given insulin  after surgery (instead of oral diabetes medicines) to make sure you have good blood sugar levels. The goal for blood sugar control after surgery is 80-180 mg/dL.   WHAT DO I DO ABOUT MY DIABETES MEDICATION?  Do not take oral diabetes medicines (pills) the morning of surgery.  Hold Farxiga 3 days before surgery (do not take after 04-17-24).    THE NIGHT BEFORE SURGERY TAKE 40 units of Insulin  Glargine.  THE MORNING OF SURGERY, take 50% of Tresiba insulin .  DO NOT TAKE THE FOLLOWING 7 DAYS PRIOR TO SURGERY: Ozempic, Wegovy, Rybelsus (Semaglutide), Byetta (exenatide), Bydureon (exenatide ER), Victoza , Saxenda  (liraglutide ), or Trulicity (dulaglutide) Mounjaro (Tirzepatide) Adlyxin (Lixisenatide), Polyethylene Glycol Loxenatide.  Reviewed and Endorsed by [facility] Patient Education Committee, August 2015  Bring CPAP mask and tubing day of surgery.                              You may not have any metal on your body including  jewelry, and body piercing             Do not wear lotions, powders, cologne, or deodorant              Men may shave face and neck.   Do not bring valuables to the hospital. [place] IS NOT RESPONSIBLE   FOR VALUABLES.   Contacts, dentures or bridgework may not be worn into surgery. DO NOT BRING YOUR HOME MEDICATIONS TO THE HOSPITAL. PHARMACY WILL DISPENSE MEDICATIONS LISTED  ON YOUR MEDICATION LIST TO YOU DURING YOUR ADMISSION IN THE HOSPITAL!    Patients discharged on the day of surgery will not be allowed to drive home.  Someone NEEDS to stay with you for the first 24 hours after anesthesia.   Special Instructions: Bring a copy of your healthcare power of attorney and living will documents the day of  surgery if you haven't scanned them before.              Please read over the following fact sheets you were given: IF YOU HAVE QUESTIONS ABOUT YOUR PRE-OP  INSTRUCTIONS PLEASE CALL [phone]- [place]  If you received a COVID test during your pre-op  visit  it is requested that you wear a mask when out in public, stay away from anyone that may not be feeling well and notify your surgeon if you develop symptoms. If you test positive for Covid or have been in contact with anyone that has tested positive in the last 10 days please notify you surgeon.  [facility]- Preparing for Total Shoulder Arthroplasty    Before surgery, you can play an important role. Because skin is not sterile, your skin needs to be as free of germs as possible. You can reduce the number of germs on your skin by using the following products. Benzoyl Peroxide Gel Reduces the number of germs present on the skin Applied twice a day to shoulder area starting two days before surgery    ==================================================================  Please follow these instructions carefully:  BENZOYL PEROXIDE 5% GEL  Please do not use if you have an allergy to benzoyl peroxide.   If your skin becomes reddened/irritated stop using the benzoyl peroxide.  Starting two days before surgery, apply as follows: Apply benzoyl peroxide in the morning and at night. Apply after taking a shower. If you are not taking a shower clean entire shoulder front, back, and side along with the armpit with a clean wet washcloth.  Place a quarter-sized dollop on your shoulder and rub in thoroughly, making sure to cover the front, back, and side of your shoulder, along with the armpit.   2 days before (04-19-24)____ AM   ____ PM              1 day before (04-20-24)____ AM   ____ PM                         Do this twice a day for two days.  (Last application is the night before surgery, AFTER using the CHG soap as described below).  Do NOT apply  benzoyl peroxide gel on the day of surgery.   Pre-operative 4 CHG Bath Instructions  DYNA-Hex 4 Chlorhexidine  Gluconate 4% Solution Antiseptic 4 fl. oz   You can play a key role in reducing the risk of infection after surgery. Your skin needs to be as free of germs as possible. You can reduce the number of germs on your skin by washing with CHG (chlorhexidine  gluconate) soap before surgery. CHG is an antiseptic soap that kills germs and continues to kill germs even after washing.   DO NOT use if you have an allergy to chlorhexidine /CHG or antibacterial soaps. If your skin becomes reddened or irritated, stop using the CHG and notify one of our RNs at   Please shower with the CHG soap starting 4 days before surgery using the following schedule:     Please keep in mind the following:  DO NOT shave,  including legs and underarms, starting the day of your first shower.   You may shave your face at any point before/day of surgery.  Place clean sheets on your bed the day you start using CHG soap. Use a clean washcloth (not used since being washed) for each shower. DO NOT sleep with pets once you start using the CHG.  CHG Shower Instructions:  If you choose to wash your hair and private area, wash first with your normal shampoo/soap.  After you use shampoo/soap, rinse your hair and body thoroughly to remove shampoo/soap residue.  Turn the water  OFF and apply about 3 tablespoons (45 ml) of CHG soap to a CLEAN washcloth.  Apply CHG soap ONLY FROM YOUR NECK DOWN TO YOUR TOES (washing for 3-5 minutes)  DO NOT use CHG soap on face, private areas, open wounds, or sores.  Pay special attention to the area where your surgery is being performed.  If you are having back surgery, having someone wash your back for you may be helpful. Wait 2 minutes after CHG soap is applied, then you may rinse off the CHG soap.  Pat dry with a clean towel  Put on clean clothes/pajamas   If you choose to wear lotion,  please use ONLY the CHG-compatible lotions on the back of this paper.     Additional instructions for the day of surgery: DO NOT APPLY any lotions, deodorants, cologne, or perfumes.   Put on clean/comfortable clothes.  Brush your teeth.  Ask your nurse before applying any prescription medications to the skin.   CHG Compatible Lotions   Aveeno Moisturizing lotion  Cetaphil Moisturizing Cream  Cetaphil Moisturizing Lotion  Clairol Herbal Essence Moisturizing Lotion, Dry Skin  Clairol Herbal Essence Moisturizing Lotion, Extra Dry Skin  Clairol Herbal Essence Moisturizing Lotion, Normal Skin  Curel Age Defying Therapeutic Moisturizing Lotion with Alpha Hydroxy  Curel Extreme Care Body Lotion  Curel Soothing Hands Moisturizing Hand Lotion  Curel Therapeutic Moisturizing Cream, Fragrance-Free  Curel Therapeutic Moisturizing Lotion, Fragrance-Free  Curel Therapeutic Moisturizing Lotion, Original Formula  Eucerin Daily Replenishing Lotion  Eucerin Dry Skin Therapy Plus Alpha Hydroxy Crme  Eucerin Dry Skin Therapy Plus Alpha Hydroxy Lotion  Eucerin Original Crme  Eucerin Original Lotion  Eucerin Plus Crme Eucerin Plus Lotion  Eucerin TriLipid Replenishing Lotion  Keri Anti-Bacterial Hand Lotion  Keri Deep Conditioning Original Lotion Dry Skin Formula Softly Scented  Keri Deep Conditioning Original Lotion, Fragrance Free Sensitive Skin Formula  Keri Lotion Fast Absorbing Fragrance Free Sensitive Skin Formula  Keri Lotion Fast Absorbing Softly Scented Dry Skin Formula  Keri Original Lotion  Keri Skin Renewal Lotion Keri Silky Smooth Lotion  Keri Silky Smooth Sensitive Skin Lotion  Nivea Body Creamy Conditioning Oil  Nivea Body Extra Enriched Lotion  Nivea Body Original Lotion  Nivea Body Sheer Moisturizing Lotion Nivea Crme  Nivea Skin Firming Lotion  NutraDerm 30 Skin Lotion  NutraDerm Skin Lotion  NutraDerm Therapeutic Skin Cream  NutraDerm Therapeutic Skin Lotion  ProShield  Protective Hand Cream  Provon moisturizing lotion   PATIENT SIGNATURE_________________________________  NURSE SIGNATURE__________________________________  ________________________________________________________________________    Nasario Exon  An incentive spirometer is a tool that can help keep your lungs clear and active. This tool measures how well you are filling your lungs with each breath. Taking long deep breaths may help reverse or decrease the chance of developing breathing (pulmonary) problems (especially infection) following: A long period of time when you are unable to move or be active. BEFORE THE PROCEDURE  If the spirometer includes an indicator to show your best effort, your nurse or respiratory therapist will set it to a desired goal. If possible, sit up straight or lean slightly forward. Try not to slouch. Hold the incentive spirometer in an upright position. INSTRUCTIONS FOR USE  Sit on the edge of your bed if possible, or sit up as far as you can in bed or on a chair. Hold the incentive spirometer in an upright position. Breathe out normally. Place the mouthpiece in your mouth and seal your lips tightly around it. Breathe in slowly and as deeply as possible, raising the piston or the ball toward the top of the column. Hold your breath for 3-5 seconds or for as long as possible. Allow the piston or ball to fall to the bottom of the column. Remove the mouthpiece from your mouth and breathe out normally. Rest for a few seconds and repeat Steps 1 through 7 at least 10 times every 1-2 hours when you are awake. Take your time and take a few normal breaths between deep breaths. The spirometer may include an indicator to show your best effort. Use the indicator as a goal to work toward during each repetition. After each set of 10 deep breaths, practice coughing to be sure your lungs are clear. If you have an incision (the cut made at the time of surgery), support  your incision when coughing by placing a pillow or rolled up towels firmly against it. Once you are able to get out of bed, walk around indoors and cough well. You may stop using the incentive spirometer when instructed by your caregiver.  RISKS AND COMPLICATIONS Take your time so you do not get dizzy or light-headed. If you are in pain, you may need to take or ask for pain medication before doing incentive spirometry. It is harder to take a deep breath if you are having pain. AFTER USE Rest and breathe slowly and easily. It can be helpful to keep track of a log of your progress. Your caregiver can provide you with a simple table to help with this. If you are using the spirometer at home, follow these instructions: SEEK MEDICAL CARE IF:  You are having difficultly using the spirometer. You have trouble using the spirometer as often as instructed. Your pain medication is not giving enough relief while using the spirometer. You develop fever of 100.5 F (38.1 C) or higher. SEEK IMMEDIATE MEDICAL CARE IF:  You cough up bloody sputum that had not been present before. You develop fever of 102 F (38.9 C) or greater. You develop worsening pain at or near the incision site. MAKE SURE YOU:  Understand these instructions. Will watch your condition. Will get help right away if you are not doing well or get worse. Document Released: 09/15/2006 Document Revised: 07/28/2011 Document Reviewed: 11/16/2006 [facility] Patient Information 2014 [place], [place].

## 2024-04-12 ENCOUNTER — Encounter (HOSPITAL_COMMUNITY): Payer: Self-pay

## 2024-04-12 ENCOUNTER — Encounter (HOSPITAL_COMMUNITY)
Admission: RE | Admit: 2024-04-12 | Discharge: 2024-04-12 | Disposition: A | Source: Ambulatory Visit | Attending: Orthopedic Surgery | Admitting: Orthopedic Surgery

## 2024-04-12 ENCOUNTER — Other Ambulatory Visit: Payer: Self-pay

## 2024-04-12 DIAGNOSIS — Z7901 Long term (current) use of anticoagulants: Secondary | ICD-10-CM

## 2024-04-12 DIAGNOSIS — E119 Type 2 diabetes mellitus without complications: Secondary | ICD-10-CM

## 2024-04-12 DIAGNOSIS — Z01818 Encounter for other preprocedural examination: Secondary | ICD-10-CM

## 2024-04-12 DIAGNOSIS — I10 Essential (primary) hypertension: Secondary | ICD-10-CM

## 2024-04-12 DIAGNOSIS — Z01812 Encounter for preprocedural laboratory examination: Secondary | ICD-10-CM | POA: Diagnosis not present

## 2024-04-12 HISTORY — DX: Headache, unspecified: R51.9

## 2024-04-13 ENCOUNTER — Ambulatory Visit: Attending: Cardiology | Admitting: *Deleted

## 2024-04-13 DIAGNOSIS — Z5181 Encounter for therapeutic drug level monitoring: Secondary | ICD-10-CM | POA: Diagnosis not present

## 2024-04-13 DIAGNOSIS — I480 Paroxysmal atrial fibrillation: Secondary | ICD-10-CM

## 2024-04-13 LAB — POCT INR: INR: 3.3 — AB (ref 2.0–3.0)

## 2024-04-13 MED ORDER — ENOXAPARIN SODIUM 150 MG/ML IJ SOSY: 150.0000 mg | PREFILLED_SYRINGE | INTRAMUSCULAR | 1 refills | Status: AC

## 2024-04-13 MED ORDER — WARFARIN SODIUM 5 MG PO TABS: ORAL_TABLET | ORAL | 5 refills | Status: DC

## 2024-04-13 NOTE — Progress Notes (Signed)
 INR 3.3; Please see anticoagulation encounter

## 2024-04-13 NOTE — Patient Instructions (Addendum)
 12/4 25  Rt Shoulder Arthroplasty  04/11/24  Labs:  Hgb 14.1  Hct 42.8  Plts 105  Scr 0.97  CrCl 102.46  Wt 96.7kg  Lovenox  150mg  SQ daily at 8am  Rx sent to [facility]  # 10 x 1 refill  11/28  Last dose of warfarin 11/29  No Lovenox  or warfarin 11/30 - 12/2  Lovenox  150mg  sq at 8am 12/3  No Lovenonx in am 12/4  No lovenox  in am -----surgery --------warfarin 10mg  pm 12/5  Lovenox  150mg  sq at 8am and warfarin 10mg  in pm 12/6 Lovenox  150mg  sq at 8am and warfarin 7.5mg  in pm 12/7  Lovenox  150mg  sq at 8am and warfarin 5mg  in pm 12/8 -12/9  Lovenox  150mg  sq at 8am and warfarin 7.5mg  in pm 12/10  Lovenox  150mg  sq at 8am and INR appt at 8:45am

## 2024-04-13 NOTE — Progress Notes (Signed)
 Case: [id number] Date/Time: 04/21/24 0715   Procedure: ARTHROPLASTY, SHOULDER, TOTAL, REVERSE (Left: Shoulder) -   Anesthesia type: General   Pre-op  diagnosis: Left shoulder rotator cuff tear arthropathy   Location: [facility] ROOM 06 / [facility]   Surgeons: Melita Drivers, MD       DISCUSSION: Kelly [name] is a 68 yo male with PMH of former smoking, HTN, hx of MI, CAD s/p CABG x3 (2018), A.fib on Warfarin, moderate aortic stenosis, CHF EF 45%, CHB s/p PPM in 2021 and upgrade to CRT-D (10/2022), OSA (no CPAP use), T2DM (A1c 7.2), GERD, PUD, cirrhosis, hx of lupus, non-hodgkin's lymphoma, anxiety.  Patient follows with Cardiology for above history. Last cath in 2022 showed patent grafts and distal vessel disease not amenable to PCI. Medical therapy recommended. Echo on 01/11/24 showed EF 45%, moderate AS with mean gradient . Last seen by Dr. Debera in clinic who states Overall stable from a cardiac perspective, he reports using nitroglycerin  twice in the last 6 months.  Stable NYHA class II dyspnea and no obvious fluid retention or weight gain.  No palpitations or syncope. Preoperative cardiac assessment prior to planned reverse total shoulder arthroplasty under general anesthesia with Dr. Melita. RCRI perioperative cardiac risk index is 3 points, 10% chance of major adverse cardiac events (intermediate). He should be able to proceed without additional cardiac testing. Coumadin  hold 5 days prior as already indicated with plan for Lovenox  bridge which can be coordinated through our anticoagulation clinic   Device orders requested. Per PAT RN: Pacemaker/ICD device last checked: 02/15/24 Epic- orders requested via Epic. Response from EP clinic Patient is overdue with EP provider/in-clinic device check. Will need to see MD or APP before clearance can be completed.  Per chart review last seen by EP on 12/22/22. Since patient has general cardiology clearance already will contact rep to assist  DOS.  Last Dose: Warfarin 04/15/24 (need instructions for Lovenox  bridge. Seeing RN 04/12/24 per wife for instructions  VS:  Wt Readings from Last 3 Encounters:  04/11/24 95.4 kg  01/25/24 96.7 kg  01/21/24 97.3 kg   Temp Readings from Last 3 Encounters:  04/11/24 36.8 C (Oral)  01/21/24 36.4 C (Tympanic)  10/19/23 36.9 C   BP Readings from Last 3 Encounters:  04/11/24 (!) 111/59  01/25/24 110/62  01/21/24 117/63   Pulse Readings from Last 3 Encounters:  04/11/24 64  01/25/24 62  01/21/24 (!) 59     PROVIDERS: Shona Norleen PEDLAR, MD   LABS: Labs reviewed: Acceptable for surgery. INR ordered for DOS (all labs ordered are listed, but only abnormal results are displayed)  Labs Reviewed - No data to display   IMAGES:   EKG 01/25/24:  AV dual paced rhythm  Device check 02/15/24:  Remote Defibrillator interrogation reviewed. Presenting Rhythm:A-V dual paced. Battery and lead parameters stable with stable capture and sensing. Device programming is appropriate. No arrhythmias noted. Continue remote monitoring.  Echo 01/11/24:  IMPRESSIONS    1. Left ventricular ejection fraction, by estimation, is 45%. Left ventricular ejection fraction by 3D volume is 55 %. The left ventricle has mildly decreased function. The left ventricle has no regional wall motion abnormalities. Left ventricular diastolic function could not be evaluated. The average left ventricular global longitudinal strain is -12.9 %.  2. Right ventricular systolic function is normal. The right ventricular size is mildly enlarged. There is normal pulmonary artery systolic pressure.  3. The mitral valve is normal in structure. Trivial mitral valve regurgitation. No evidence of  mitral stenosis.  4. The aortic valve is tricuspid. There is moderate calcification of the aortic valve. There is moderate thickening of the aortic valve. Aortic valve regurgitation is trivial. Moderate aortic valve stenosis.  Aortic valve area, by VTI measures 1.45 cm. Aortic valve mean gradient measures 17.0 mmHg. Aortic valve Vmax measures 3.06 m/s. DVI is 0.29.  5. The inferior vena cava is normal in size with greater than 50% respiratory variability, suggesting right atrial pressure of 3 mmHg.  Comparison(s): A prior study was performed on 03/06/2023. No significant change from prior study. EF 45-50%. Mild LVH. Moderate aortic valve stenosis mean pg 22 mmHg.  LHC 06/29/2020:  1.  Severe native three-vessel coronary artery disease with severe stenosis of the proximal LAD and associated heavy calcification, chronic total occlusion of the ostial RCA, and moderately severe left circumflex stenosis 2.  Status post aortocoronary bypass surgery with continued patency of the LIMA to LAD graft, saphenous vein graft to second obtuse marginal, and saphenous vein graft to right PDA. 3.  Normal LVEDP   Recommendations: The patient has patency of all bypass conduits now 4 years out from coronary bypass surgery.  He has distal vessel disease, not amenable to PCI.  Recommend ongoing medical therapy/increase antianginal program.  Will add low-dose isosorbide  as tolerated. Past Medical History:  Diagnosis Date   AICD (automatic cardioverter/defibrillator) present    Anemia    Anxiety    Aortic stenosis    Arthritis    Atrial fibrillation [facility])    Atrial fibrillation (HCC)    CHF (congestive heart failure) (HCC)    Chronic pain    Cirrhosis (HCC)    Coronary atherosclerosis of native coronary artery    BMS LAD and PTCA RCA 2000,BMS RCA/PLA 2001, CABG June 2018   Depression    Diverticulitis    Essential hypertension    Fatty liver    Gastric ulcer    GERD (gastroesophageal reflux disease)    Headache    Hyperlipidemia    Low-grade NHL (non-Hodgkin's lymphoma) 2007   Lupus anticoagulant positive    MI (myocardial infarction) (HCC) 2001   Pacemaker    Paresthesia of upper limb    Peripheral neuropathy     Peripheral vascular disease    blood clots in legs   Sleep apnea    no longer using CPAP   Thrombocytopenia    Trifascicular block    Type 2 diabetes mellitus (HCC)    Umbilical hernia     Past Surgical History:  Procedure Laterality Date   Axillary abscess     left incision and drainage left   BACK SURGERY     multiple   BIOPSY  08/15/2014   Procedure: BIOPSY;  Surgeon: Margo LITTIE Haddock, MD;  Location: [facility];  Service: Endoscopy;;  Gastric   BIOPSY  01/19/2015   Procedure: BIOPSY (GASTRIC ULCER);  Surgeon: Margo LITTIE Haddock, MD;  Location: [facility];  Service: Endoscopy;;   CARDIAC CATHETERIZATION     cardiac stents     x5 stents   CATARACT EXTRACTION Bilateral    COLONOSCOPY  10/2006   Dr. Reyes Medoff: Mild left colonic diverticulosis   COLONOSCOPY WITH PROPOFOL  N/A 02/06/2023   Procedure: COLONOSCOPY WITH PROPOFOL ;  Surgeon: Cindie Carlin POUR, DO;  [facility];  Service: Endoscopy;  Laterality: N/A;  8:45 am, asa 3   CORONARY ARTERY BYPASS GRAFT N/A 11/11/2016   Procedure: CORONARY ARTERY BYPASS GRAFTING (CABG) x 3, USING LEFT MAMMARY ARTERY AND RIGHT GREATER  SAPHENOUS VEIN HARVESTED ENDOSCOPICALLY;  Surgeon: Fleeta Hanford Coy, MD;  Location: [facility] OR;  Service: Open Heart Surgery;  Laterality: N/A;   CORONARY PRESSURE/FFR STUDY N/A 09/26/2016   Procedure: Intravascular Pressure Wire/FFR Study;  Surgeon: Mady Bruckner, MD;  [facility]: [facility]VAS[facility] CV LAB;  Service: Cardiovascular;  Laterality: N/A;   Dental extractions     ESOPHAGOGASTRODUODENOSCOPY  10/2006   Dr. Reyes Medoff: Gastric ulcer, antral, biopsy with reactive changes associated glandular atrophy, no H pylori. No features of lymphoma. Likely NSAID related   ESOPHAGOGASTRODUODENOSCOPY  12/2006   Dr. Reyes Medoff: Complete healing of gastric ulcers.   ESOPHAGOGASTRODUODENOSCOPY (EGD) WITH PROPOFOL  N/A 08/15/2014   SLF: 1. Abnormal pain due to ulcers & gastriitis    ESOPHAGOGASTRODUODENOSCOPY (EGD) WITH  PROPOFOL  N/A 01/19/2015   SLF: 1. Persistant ulcer with firm base 2. single gastric polyp found in the gastric antrum. Ulcer base    ESOPHAGOGASTRODUODENOSCOPY (EGD) WITH PROPOFOL  N/A 02/06/2023   Procedure: ESOPHAGOGASTRODUODENOSCOPY (EGD) WITH PROPOFOL ;  Surgeon: Cindie Carlin POUR, DO;  [facility];  Service: Endoscopy;  Laterality: N/A;   EUS N/A 03/21/2015   Thickened gastric wall in pre-pyloric antrum, no obvious intramural mass, fatty pancreas, no obvious pancreatic mass.    Inguinal lymph node biopsy     right inguinal lymph node biopsy   LEAD EXTRACTION N/A 11/14/2022   Procedure: LEAD EXTRACTION;  Surgeon: Cindie Ole DASEN, MD;  Location: [facility] INVASIVE CV LAB;  Service: Cardiovascular;  Laterality: N/A;   LEFT HEART CATH AND CORONARY ANGIOGRAPHY N/A 09/26/2016   Procedure: Left Heart Cath and Coronary Angiography;  Surgeon: Mady Bruckner, MD;  [facility];  Service: Cardiovascular;  Laterality: N/A;   LEFT HEART CATH AND CORS/GRAFTS ANGIOGRAPHY N/A 06/29/2020   Procedure: LEFT HEART CATH AND CORS/GRAFTS ANGIOGRAPHY;  Surgeon: Wonda Sharper, MD;  Location: [facility] INVASIVE CV LAB;  Service: Cardiovascular;  Laterality: N/A;   NECK SURGERY     PACEMAKER IMPLANT N/A 03/12/2020   Procedure: PACEMAKER IMPLANT;  Surgeon: Cindie Ole DASEN, MD;  [facility];  Service: Cardiovascular;  Laterality: N/A;   POLYPECTOMY  01/19/2015   Procedure: POLYPECTOMY (GASTRIC);  Surgeon: Margo LITTIE Haddock, MD;  Location[facility];  Service: Endoscopy;;   ROUX-EN-Y GASTRIC BYPASS     RNY gastric bypass   SHOULDER ARTHROSCOPY Left    torn rotator cuff   TEE WITHOUT CARDIOVERSION N/A 11/11/2016   Procedure: TRANSESOPHAGEAL ECHOCARDIOGRAM (TEE);  Surgeon: Fleeta Hanford, Coy, MD;  Location: [facility] OR;  Service: Open Heart Surgery;  Laterality: N/A;    MEDICATIONS:  acetaminophen  (TYLENOL ) 500 MG tablet   Ascorbic Acid (VITAMIN C) 500 MG CAPS   aspirin  EC 81 MG tablet    B Complex-C (B-COMPLEX WITH VITAMIN C) tablet   Cholecalciferol (VITAMIN D3) 50 MCG (2000 UT) TABS   dapagliflozin propanediol (FARXIGA) 10 MG TABS tablet   enoxaparin  (LOVENOX ) 150 MG/ML injection   ferrous sulfate  325 (65 FE) MG tablet   FLUoxetine  (PROZAC ) 10 MG capsule   gabapentin  (NEURONTIN ) 600 MG tablet   glucose blood (ONETOUCH VERIO) test strip   isosorbide  mononitrate (IMDUR ) 30 MG 24 hr tablet   levocetirizine (XYZAL) 5 MG tablet   lipase/protease/amylase (CREON ) 36000 UNITS CPEP capsule   Magnesium  250 MG TABS   meclizine  (ANTIVERT ) 25 MG tablet   Menthol-Methyl Salicylate (MUSCLE RUB) 10-15 % CREA   Multiple Vitamin (MULTIVITAMIN WITH MINERALS) TABS tablet   nitroGLYCERIN  (NITROSTAT ) 0.4 MG SL tablet   ONETOUCH VERIO test  strip   Oxycodone  HCl 10 MG TABS   pantoprazole  (PROTONIX ) 40 MG tablet   Pediatric Multivitamins-Iron (FLINTSTONES COMPLETE PO)   rosuvastatin  (CRESTOR ) 20 MG tablet   sacubitril-valsartan (ENTRESTO ) 24-26 MG   TRESIBA FLEXTOUCH 100 UNIT/ML FlexTouch Pen   warfarin (COUMADIN ) 5 MG tablet   No current facility-administered medications for this encounter.   Burnard CHRISTELLA Odis DEVONNA [facility] Short Stay/Anesthesiology [facility] Phone [phone] 04/13/2024 10:47 AM

## 2024-04-13 NOTE — Anesthesia Preprocedure Evaluation (Addendum)
 Anesthesia Evaluation  Patient identified by MRN, date of birth, ID band Patient awake    Reviewed: Allergy & Precautions, NPO status , Patient's Chart, lab work & pertinent test results  History of Anesthesia Complications Negative for: history of anesthetic complications  Airway Mallampati: II  TM Distance: >3 FB Neck ROM: Full    Dental  (+) Dental Advisory Given   Pulmonary sleep apnea (no longer uses CPAP) and Continuous Positive Airway Pressure Ventilation , former smoker   breath sounds clear to auscultation       Cardiovascular hypertension, (-) angina + CAD, + Past MI, + Cardiac Stents, + CABG and +CHF (Entresto )  + dysrhythmias Atrial Fibrillation + pacemaker (BiV) + Cardiac Defibrillator  Rhythm:Regular Rate:Normal  09/2023 ECHO: EF 45-55% 1. LV mildly decreased function 2. RVF normal 3. Trivial MR 4. Moderate AS with mean gradient 17 mmHg, trivial AI   Neuro/Psych  Headaches  Anxiety Depression       GI/Hepatic ,GERD  Medicated and Controlled,,(+) Cirrhosis         Endo/Other  diabetes (glu 133), Insulin  Dependent    Renal/GU negative Renal ROS     Musculoskeletal   Abdominal   Peds  Hematology H/o non-Hodgkin's lymphoma Hb 14.1, plt 105k Coumadin : last dose 04/15/2024 INR 1.3   Anesthesia Other Findings   Reproductive/Obstetrics                              Anesthesia Physical Anesthesia Plan  ASA: 4  Anesthesia Plan: General   Post-op Pain Management: Regional block*   Induction: Intravenous  PONV Risk Score and Plan: 2 and Ondansetron  and Dexamethasone  Airway Management Planned: Oral ETT  Additional Equipment: ClearSight  Intra-op Plan:   Post-operative Plan: Extubation in OR  Informed Consent: I have reviewed the patients History and Physical, chart, labs and discussed the procedure including the risks, benefits and alternatives for the proposed  anesthesia with the patient or authorized representative who has indicated his/her understanding and acceptance.     Dental advisory given  Plan Discussed with: CRNA and Surgeon  Anesthesia Plan Comments: (See PAT note from 11/25 Abbott disabling tachytherapy, DOO 75, will place R2 defib pads intra-op GETA with interscalene block for post op analgesia)         Anesthesia Quick Evaluation

## 2024-04-17 NOTE — Progress Notes (Unsigned)
  Electrophysiology Office Follow up Visit Note:    Date:  04/18/2024   ID:  Kelly [name], DOB Oct 04, 1955, MRN [id number]  PCP:  Kelly Norleen PEDLAR, MD  [facility] Cardiologist:  Kelly Sierras, MD  [facility] Electrophysiologist:  Kelly ONEIDA HOLTS, MD    Interval History:     Kelly [name] is a 68 y.o. male who presents for a follow up visit.   I last saw the patient December 22, 2022.  He had his implanted device upgraded to a CRT-D on November 14, 2022.  He has a history of chronic systolic heart failure.  Also history of atrial fibrillation on warfarin.  He is doing well.  He is with his wife in clinic today.  He has a planned shoulder replacement surgery this Thursday.      Past medical, surgical, social and family history were reviewed.  ROS:   Please see the history of present illness.    All other systems reviewed and are negative.  EKGs/Labs/Other Studies Reviewed:    The following studies were reviewed today:  April 18, 2024 in-clinic device interrogation personally reviewed Battery and lead parameter stable   January 11, 2024 echo EF 50 percent Moderate aortic stenosis       Physical Exam:    VS:  BP 114/66   Pulse 67   Ht 6' 4 (1.93 m)   Wt 215 lb (97.5 kg)   SpO2 98%   BMI 26.17 kg/m     Wt Readings from Last 3 Encounters:  04/18/24 215 lb (97.5 kg)  04/11/24 210 lb 6.4 oz (95.4 kg)  01/25/24 213 lb 3.2 oz (96.7 kg)     GEN: no distress CARD: RRR, No MRG.  Generator pocket well-healed RESP: No IWOB. CTAB.      ASSESSMENT:    1. Paroxysmal atrial fibrillation (HCC)   2. ICD (implantable cardioverter-defibrillator) in place   3. Heart block AV complete (HCC)   4. Chronic systolic heart failure (HCC)    PLAN:    In order of problems listed above:  #CRT-D in situ #Chronic systolic heart failure NYHA class II.  Warm and dry on exam.  Continue current medical therapy. Device functioning appropriately.  Continue remote  monitoring.  #Atrial fibrillation #Recurrent DVT Continue warfarin  #Preop restratification The patient is at acceptable risk to undergo planned orthopedic surgery.   I discussed my upcoming departure from Kelly [name] during today's clinic appointment.  The patient will continue to follow-up with one of my EP partners moving forward.  Follow-up with EP APP in 1 year  Signed, Kelly Holts, MD, [facility], [facility] 04/18/2024 2:56 PM    Electrophysiology [facility]

## 2024-04-18 ENCOUNTER — Ambulatory Visit: Attending: Cardiology | Admitting: Cardiology

## 2024-04-18 ENCOUNTER — Encounter: Payer: Self-pay | Admitting: Cardiology

## 2024-04-18 ENCOUNTER — Encounter: Payer: Self-pay | Admitting: Student in an Organized Health Care Education/Training Program

## 2024-04-18 VITALS — BP 114/66 | HR 67 | Ht 76.0 in | Wt 215.0 lb

## 2024-04-18 DIAGNOSIS — I480 Paroxysmal atrial fibrillation: Secondary | ICD-10-CM | POA: Diagnosis not present

## 2024-04-18 DIAGNOSIS — Z95810 Presence of automatic (implantable) cardiac defibrillator: Secondary | ICD-10-CM | POA: Diagnosis not present

## 2024-04-18 DIAGNOSIS — I442 Atrioventricular block, complete: Secondary | ICD-10-CM

## 2024-04-18 DIAGNOSIS — I5022 Chronic systolic (congestive) heart failure: Secondary | ICD-10-CM

## 2024-04-18 LAB — CUP PACEART INCLINIC DEVICE CHECK
Battery Remaining Longevity: 50 mo
Brady Statistic RA Percent Paced: 61 %
Brady Statistic RV Percent Paced: 99.5 %
Date Time Interrogation Session: 20251201150344
HighPow Impedance: 50.625
Implantable Lead Connection Status: 753985
Implantable Lead Connection Status: 753985
Implantable Lead Connection Status: 753985
Implantable Lead Implant Date: 20211025
Implantable Lead Implant Date: 20240628
Implantable Lead Implant Date: 20240628
Implantable Lead Location: 753859
Implantable Lead Location: 753860
Implantable Lead Location: 753860
Implantable Pulse Generator Implant Date: 20240628
Lead Channel Impedance Value: 412.5 Ohm
Lead Channel Impedance Value: 462.5 Ohm
Lead Channel Impedance Value: 462.5 Ohm
Lead Channel Pacing Threshold Amplitude: 0.75 V
Lead Channel Pacing Threshold Amplitude: 1 V
Lead Channel Pacing Threshold Amplitude: 1 V
Lead Channel Pacing Threshold Amplitude: 2.5 V
Lead Channel Pacing Threshold Amplitude: 2.5 V
Lead Channel Pacing Threshold Pulse Width: 0.5 ms
Lead Channel Pacing Threshold Pulse Width: 0.5 ms
Lead Channel Pacing Threshold Pulse Width: 0.5 ms
Lead Channel Pacing Threshold Pulse Width: 1 ms
Lead Channel Pacing Threshold Pulse Width: 1 ms
Lead Channel Sensing Intrinsic Amplitude: 1.7 mV
Lead Channel Sensing Intrinsic Amplitude: 9.1 mV
Lead Channel Setting Pacing Amplitude: 0.25 V
Lead Channel Setting Pacing Amplitude: 2 V
Lead Channel Setting Pacing Amplitude: 3 V
Lead Channel Setting Pacing Pulse Width: 0.05 ms
Lead Channel Setting Pacing Pulse Width: 0.5 ms
Lead Channel Setting Sensing Sensitivity: 0.5 mV
Pulse Gen Serial Number: 5563291
Zone Setting Status: 755011

## 2024-04-18 NOTE — Progress Notes (Signed)
 PERIOPERATIVE PRESCRIPTION FOR IMPLANTED CARDIAC DEVICE PROGRAMMING  Patient Information: Name:  Kelly [name]  DOB:  1955-10-03  MRN:  [id number]    Planned Procedure:  left reverse shoulder arthroplasty  Surgeon:  Dr. Melita  Date of Procedure:  04/21/24  Cautery will be used.  Position during surgery:  unknown   Please send documentation back to:  Darryle Law (Fax # [phone])   Device Information:  Clinic EP Physician:  Dr. Adina Primus   Device Type:  Defibrillator Manufacturer and Phone #:  St. Jude/Abbott: [phone] Pacemaker Dependent?:  No. Date of Last Device Check:  04/18/2024  Normal Device Function?:  Yes.    Electrophysiologist's Recommendations:  Have magnet available. Provide continuous ECG monitoring when magnet is used or reprogramming is to be performed.  Procedure will likely interfere with device function.  Device should be programmed:  Asynchronous pacing during procedure and returned to normal programming after procedure  Per Device Clinic Standing Orders, Almarie ONEIDA Shutter, RN  3:49 PM 04/18/2024

## 2024-04-18 NOTE — Patient Instructions (Signed)

## 2024-04-21 ENCOUNTER — Ambulatory Visit (HOSPITAL_COMMUNITY)
Admission: RE | Admit: 2024-04-21 | Discharge: 2024-04-21 | Disposition: A | Discharge status: Home / Self Care | Source: Ambulatory Visit | Attending: Orthopedic Surgery | Admitting: Orthopedic Surgery

## 2024-04-21 ENCOUNTER — Encounter (HOSPITAL_COMMUNITY): Payer: Self-pay | Admitting: Orthopedic Surgery

## 2024-04-21 ENCOUNTER — Ambulatory Visit (HOSPITAL_COMMUNITY): Payer: Self-pay | Admitting: Medical

## 2024-04-21 ENCOUNTER — Ambulatory Visit (HOSPITAL_COMMUNITY): Payer: Self-pay

## 2024-04-21 ENCOUNTER — Encounter (HOSPITAL_COMMUNITY)
Admission: RE | Disposition: A | Discharge status: Home / Self Care | Payer: Self-pay | Source: Ambulatory Visit | Attending: Orthopedic Surgery

## 2024-04-21 ENCOUNTER — Other Ambulatory Visit: Payer: Self-pay

## 2024-04-21 DIAGNOSIS — M19012 Primary osteoarthritis, left shoulder: Secondary | ICD-10-CM | POA: Diagnosis not present

## 2024-04-21 DIAGNOSIS — I2510 Atherosclerotic heart disease of native coronary artery without angina pectoris: Secondary | ICD-10-CM | POA: Diagnosis not present

## 2024-04-21 DIAGNOSIS — M75101 Unspecified rotator cuff tear or rupture of right shoulder, not specified as traumatic: Secondary | ICD-10-CM

## 2024-04-21 DIAGNOSIS — I509 Heart failure, unspecified: Secondary | ICD-10-CM | POA: Diagnosis not present

## 2024-04-21 DIAGNOSIS — Z951 Presence of aortocoronary bypass graft: Secondary | ICD-10-CM | POA: Insufficient documentation

## 2024-04-21 DIAGNOSIS — K219 Gastro-esophageal reflux disease without esophagitis: Secondary | ICD-10-CM | POA: Insufficient documentation

## 2024-04-21 DIAGNOSIS — I110 Hypertensive heart disease with heart failure: Secondary | ICD-10-CM | POA: Insufficient documentation

## 2024-04-21 DIAGNOSIS — I4891 Unspecified atrial fibrillation: Secondary | ICD-10-CM | POA: Insufficient documentation

## 2024-04-21 DIAGNOSIS — E119 Type 2 diabetes mellitus without complications: Secondary | ICD-10-CM

## 2024-04-21 DIAGNOSIS — Z95810 Presence of automatic (implantable) cardiac defibrillator: Secondary | ICD-10-CM | POA: Insufficient documentation

## 2024-04-21 DIAGNOSIS — M75102 Unspecified rotator cuff tear or rupture of left shoulder, not specified as traumatic: Secondary | ICD-10-CM | POA: Insufficient documentation

## 2024-04-21 DIAGNOSIS — F419 Anxiety disorder, unspecified: Secondary | ICD-10-CM | POA: Insufficient documentation

## 2024-04-21 DIAGNOSIS — Z7901 Long term (current) use of anticoagulants: Secondary | ICD-10-CM | POA: Insufficient documentation

## 2024-04-21 DIAGNOSIS — Z79899 Other long term (current) drug therapy: Secondary | ICD-10-CM | POA: Insufficient documentation

## 2024-04-21 DIAGNOSIS — Z794 Long term (current) use of insulin: Secondary | ICD-10-CM | POA: Insufficient documentation

## 2024-04-21 DIAGNOSIS — K7460 Unspecified cirrhosis of liver: Secondary | ICD-10-CM | POA: Insufficient documentation

## 2024-04-21 DIAGNOSIS — F32A Depression, unspecified: Secondary | ICD-10-CM | POA: Insufficient documentation

## 2024-04-21 DIAGNOSIS — Z8572 Personal history of non-Hodgkin lymphomas: Secondary | ICD-10-CM | POA: Insufficient documentation

## 2024-04-21 DIAGNOSIS — Z7984 Long term (current) use of oral hypoglycemic drugs: Secondary | ICD-10-CM | POA: Insufficient documentation

## 2024-04-21 DIAGNOSIS — I252 Old myocardial infarction: Secondary | ICD-10-CM | POA: Insufficient documentation

## 2024-04-21 DIAGNOSIS — Z87891 Personal history of nicotine dependence: Secondary | ICD-10-CM | POA: Insufficient documentation

## 2024-04-21 DIAGNOSIS — E1151 Type 2 diabetes mellitus with diabetic peripheral angiopathy without gangrene: Secondary | ICD-10-CM | POA: Insufficient documentation

## 2024-04-21 DIAGNOSIS — G4730 Sleep apnea, unspecified: Secondary | ICD-10-CM | POA: Insufficient documentation

## 2024-04-21 DIAGNOSIS — Z955 Presence of coronary angioplasty implant and graft: Secondary | ICD-10-CM | POA: Insufficient documentation

## 2024-04-21 HISTORY — PX: REVERSE SHOULDER ARTHROPLASTY: SHX5054

## 2024-04-21 LAB — GLUCOSE, CAPILLARY: Glucose-Capillary: 133 mg/dL — ABNORMAL HIGH (ref 70–99)

## 2024-04-21 LAB — PROTIME-INR
INR: 1.3 — ABNORMAL HIGH (ref 0.8–1.2)
Prothrombin Time: 16.7 s — ABNORMAL HIGH (ref 11.4–15.2)

## 2024-04-21 LAB — APTT: aPTT: 51 s — ABNORMAL HIGH (ref 24–36)

## 2024-04-21 SURGERY — ARTHROPLASTY, SHOULDER, TOTAL, REVERSE
Anesthesia: General | Site: Shoulder | Laterality: Left

## 2024-04-21 MED ORDER — PHENYLEPHRINE HCL-NACL 20-0.9 MG/250ML-% IV SOLN
INTRAVENOUS | Status: DC | PRN
  Administered 2024-04-21: 40 ug/min via INTRAVENOUS
  Administered 2024-04-21: 80 ug/min via INTRAVENOUS

## 2024-04-21 MED ORDER — LACTATED RINGERS IV SOLN: INTRAVENOUS | Status: DC

## 2024-04-21 MED ORDER — ONDANSETRON HCL 4 MG/2ML IJ SOLN
INTRAMUSCULAR | Status: DC | PRN
  Administered 2024-04-21: 4 mg via INTRAVENOUS

## 2024-04-21 MED ORDER — LIDOCAINE HCL (PF) 2 % IJ SOLN
INTRAMUSCULAR | Status: AC
  Filled 2024-04-21: qty 5

## 2024-04-21 MED ORDER — PROPOFOL 10 MG/ML IV BOLUS
INTRAVENOUS | Status: DC | PRN
  Administered 2024-04-21: 80 mg via INTRAVENOUS

## 2024-04-21 MED ORDER — FENTANYL CITRATE (PF) 100 MCG/2ML IJ SOLN
INTRAMUSCULAR | Status: AC
  Filled 2024-04-21: qty 2

## 2024-04-21 MED ORDER — MIDAZOLAM HCL 2 MG/2ML IJ SOLN
INTRAMUSCULAR | Status: AC
  Filled 2024-04-21: qty 2

## 2024-04-21 MED ORDER — MEPERIDINE HCL 25 MG/ML IJ SOLN: 6.2500 mg | INTRAMUSCULAR | Status: DC | PRN

## 2024-04-21 MED ORDER — HYDROMORPHONE HCL 1 MG/ML IJ SOLN
INTRAMUSCULAR | Status: AC
  Filled 2024-04-21: qty 1

## 2024-04-21 MED ORDER — METOCLOPRAMIDE HCL 5 MG PO TABS: 5.0000 mg | ORAL_TABLET | Freq: Three times a day (TID) | ORAL | Status: DC | PRN

## 2024-04-21 MED ORDER — PHENYLEPHRINE 80 MCG/ML (10ML) SYRINGE FOR IV PUSH (FOR BLOOD PRESSURE SUPPORT)
PREFILLED_SYRINGE | INTRAVENOUS | Status: DC | PRN
  Administered 2024-04-21: 80 ug via INTRAVENOUS

## 2024-04-21 MED ORDER — MIDAZOLAM HCL (PF) 2 MG/2ML IJ SOLN: 0.5000 mg | Freq: Once | INTRAMUSCULAR | Status: DC | PRN

## 2024-04-21 MED ORDER — VANCOMYCIN HCL 1000 MG IV SOLR
INTRAVENOUS | Status: DC | PRN
  Administered 2024-04-21: 1000 mg via TOPICAL

## 2024-04-21 MED ORDER — HYDROMORPHONE HCL 4 MG PO TABS: 4.0000 mg | ORAL_TABLET | ORAL | 0 refills | Status: AC | PRN

## 2024-04-21 MED ORDER — HYDROMORPHONE HCL 1 MG/ML IJ SOLN
0.2500 mg | INTRAMUSCULAR | Status: DC | PRN
  Administered 2024-04-21 (×3): 0.5 mg via INTRAVENOUS

## 2024-04-21 MED ORDER — PHENYLEPHRINE HCL-NACL 20-0.9 MG/250ML-% IV SOLN
INTRAVENOUS | Status: AC
  Filled 2024-04-21: qty 250

## 2024-04-21 MED ORDER — ROCURONIUM BROMIDE 100 MG/10ML IV SOLN
INTRAVENOUS | Status: DC | PRN
  Administered 2024-04-21: 60 mg via INTRAVENOUS

## 2024-04-21 MED ORDER — BUPIVACAINE LIPOSOME 1.3 % IJ SUSP
INTRAMUSCULAR | Status: DC | PRN
  Administered 2024-04-21: 10 mL via PERINEURAL

## 2024-04-21 MED ORDER — PROPOFOL 10 MG/ML IV BOLUS
INTRAVENOUS | Status: AC
  Filled 2024-04-21: qty 20

## 2024-04-21 MED ORDER — CEFAZOLIN SODIUM-DEXTROSE 2-4 GM/100ML-% IV SOLN
2.0000 g | INTRAVENOUS | Status: AC
  Administered 2024-04-21: 2 g via INTRAVENOUS
  Filled 2024-04-21: qty 100

## 2024-04-21 MED ORDER — STERILE WATER FOR IRRIGATION IR SOLN
Status: DC | PRN
  Administered 2024-04-21: 2000 mL

## 2024-04-21 MED ORDER — ONDANSETRON HCL 4 MG/2ML IJ SOLN
INTRAMUSCULAR | Status: AC
  Filled 2024-04-21: qty 2

## 2024-04-21 MED ORDER — ONDANSETRON HCL 4 MG PO TABS: 4.0000 mg | ORAL_TABLET | Freq: Three times a day (TID) | ORAL | 0 refills | Status: AC | PRN

## 2024-04-21 MED ORDER — TRANEXAMIC ACID-NACL 1000-0.7 MG/100ML-% IV SOLN
1000.0000 mg | INTRAVENOUS | Status: AC
  Administered 2024-04-21: 1000 mg via INTRAVENOUS
  Filled 2024-04-21: qty 100

## 2024-04-21 MED ORDER — ROCURONIUM BROMIDE 10 MG/ML (PF) SYRINGE
PREFILLED_SYRINGE | INTRAVENOUS | Status: AC
  Filled 2024-04-21: qty 10

## 2024-04-21 MED ORDER — OXYCODONE HCL 5 MG/5ML PO SOLN: 5.0000 mg | Freq: Once | ORAL | Status: AC | PRN

## 2024-04-21 MED ORDER — SUGAMMADEX SODIUM 200 MG/2ML IV SOLN
INTRAVENOUS | Status: AC
  Filled 2024-04-21: qty 2

## 2024-04-21 MED ORDER — SUGAMMADEX SODIUM 200 MG/2ML IV SOLN
INTRAVENOUS | Status: DC | PRN
  Administered 2024-04-21: 200 mg via INTRAVENOUS

## 2024-04-21 MED ORDER — OXYCODONE HCL 5 MG PO TABS
5.0000 mg | ORAL_TABLET | Freq: Once | ORAL | Status: AC | PRN
  Administered 2024-04-21: 5 mg via ORAL

## 2024-04-21 MED ORDER — FENTANYL CITRATE (PF) 100 MCG/2ML IJ SOLN
INTRAMUSCULAR | Status: DC | PRN
  Administered 2024-04-21 (×2): 50 ug via INTRAVENOUS

## 2024-04-21 MED ORDER — CHLORHEXIDINE GLUCONATE 0.12 % MT SOLN
15.0000 mL | Freq: Once | OROMUCOSAL | Status: AC
  Administered 2024-04-21: 15 mL via OROMUCOSAL

## 2024-04-21 MED ORDER — METOCLOPRAMIDE HCL 5 MG/ML IJ SOLN: 5.0000 mg | Freq: Three times a day (TID) | INTRAMUSCULAR | Status: DC | PRN

## 2024-04-21 MED ORDER — OXYCODONE HCL 5 MG PO TABS
ORAL_TABLET | ORAL | Status: AC
  Filled 2024-04-21: qty 1

## 2024-04-21 MED ORDER — BUPIVACAINE-EPINEPHRINE (PF) 0.5% -1:200000 IJ SOLN
INTRAMUSCULAR | Status: DC | PRN
  Administered 2024-04-21: 10 mL via PERINEURAL

## 2024-04-21 MED ORDER — DEXAMETHASONE SOD PHOSPHATE PF 10 MG/ML IJ SOLN
INTRAMUSCULAR | Status: DC | PRN
  Administered 2024-04-21: 4 mg via INTRAVENOUS

## 2024-04-21 MED ORDER — 0.9 % SODIUM CHLORIDE (POUR BTL) OPTIME
TOPICAL | Status: DC | PRN
  Administered 2024-04-21: 1000 mL

## 2024-04-21 MED ORDER — ORAL CARE MOUTH RINSE: 15.0000 mL | Freq: Once | OROMUCOSAL | Status: AC

## 2024-04-21 MED ORDER — CYCLOBENZAPRINE HCL 10 MG PO TABS: 10.0000 mg | ORAL_TABLET | Freq: Three times a day (TID) | ORAL | 1 refills | Status: AC | PRN

## 2024-04-21 MED ORDER — LIDOCAINE HCL (CARDIAC) PF 100 MG/5ML IV SOSY
PREFILLED_SYRINGE | INTRAVENOUS | Status: DC | PRN
  Administered 2024-04-21: 10 mg via INTRAVENOUS

## 2024-04-21 MED ORDER — PHENYLEPHRINE 80 MCG/ML (10ML) SYRINGE FOR IV PUSH (FOR BLOOD PRESSURE SUPPORT)
PREFILLED_SYRINGE | INTRAVENOUS | Status: AC
  Filled 2024-04-21: qty 20

## 2024-04-21 MED ORDER — VANCOMYCIN HCL 1000 MG IV SOLR
INTRAVENOUS | Status: AC
  Filled 2024-04-21: qty 20

## 2024-04-21 SURGICAL SUPPLY — 57 items
BAG COUNTER SPONGE SURGICOUNT (BAG) IMPLANT
BAG ZIPLOCK 12X15 (MISCELLANEOUS) ×2 IMPLANT
BLADE SAW SGTL 83.5X18.5 (BLADE) ×2 IMPLANT
BNDG COHESIVE 4X5 TAN STRL LF (GAUZE/BANDAGES/DRESSINGS) ×2 IMPLANT
COOLER ICEMAN CLASSIC (MISCELLANEOUS) ×2 IMPLANT
COVER BACK TABLE 60X90IN (DRAPES) ×2 IMPLANT
COVER SURGICAL LIGHT HANDLE (MISCELLANEOUS) ×2 IMPLANT
CUP SUT UNIV REVERS 39 NEU (Shoulder) IMPLANT
DRAPE SHEET LG 3/4 BI-LAMINATE (DRAPES) ×2 IMPLANT
DRAPE SURG 17X11 SM STRL (DRAPES) ×2 IMPLANT
DRAPE SURG ORHT 6 SPLT 77X108 (DRAPES) ×4 IMPLANT
DRAPE TOP 10253 STERILE (DRAPES) ×2 IMPLANT
DRAPE U-SHAPE 47X51 STRL (DRAPES) ×2 IMPLANT
DRESSING AQUACEL AG SP 3.5X6 (GAUZE/BANDAGES/DRESSINGS) ×2 IMPLANT
DRSG AQUACEL AG ADV 3.5X 6 (GAUZE/BANDAGES/DRESSINGS) IMPLANT
DURAPREP 26ML APPLICATOR (WOUND CARE) ×2 IMPLANT
ELECT BLADE TIP CTD 4 INCH (ELECTRODE) ×2 IMPLANT
ELECT PENCIL ROCKER SW 15FT (MISCELLANEOUS) ×2 IMPLANT
ELECT REM PT RETURN 15FT ADLT (MISCELLANEOUS) ×2 IMPLANT
FACESHIELD WRAPAROUND OR TEAM (MASK) ×10 IMPLANT
GLENOID UNI REV MOD 24 +2 LAT (Joint) IMPLANT
GLENOSPHERE 39+4 LAT/24 UNI RV (Joint) IMPLANT
GLOVE BIO SURGEON STRL SZ7.5 (GLOVE) ×2 IMPLANT
GLOVE BIO SURGEON STRL SZ8 (GLOVE) ×2 IMPLANT
GLOVE SS BIOGEL STRL SZ 7 (GLOVE) ×2 IMPLANT
GLOVE SS BIOGEL STRL SZ 7.5 (GLOVE) ×2 IMPLANT
GOWN STRL SURGICAL XL XLNG (GOWN DISPOSABLE) ×4 IMPLANT
INSERT HUMERAL MED 39/ +3 (Shoulder) IMPLANT
KIT BASIN OR (CUSTOM PROCEDURE TRAY) ×2 IMPLANT
KIT TURNOVER KIT A (KITS) ×2 IMPLANT
MANIFOLD NEPTUNE II (INSTRUMENTS) ×2 IMPLANT
NDL TAPERED W/ NITINOL LOOP (MISCELLANEOUS) ×2 IMPLANT
NS IRRIG 1000ML POUR BTL (IV SOLUTION) ×2 IMPLANT
PACK SHOULDER (CUSTOM PROCEDURE TRAY) ×2 IMPLANT
PAD ARMBOARD POSITIONER FOAM (MISCELLANEOUS) ×2 IMPLANT
PAD COLD SHLDR WRAP-ON (PAD) ×2 IMPLANT
PIN NITINOL TARGETER 2.8 (PIN) IMPLANT
PIN SET MODULAR GLENOID SYSTEM (PIN) IMPLANT
RESTRAINT HEAD UNIVERSAL NS (MISCELLANEOUS) ×2 IMPLANT
SCREW CENTRAL MOD 30MM (Screw) IMPLANT
SCREW PERI LOCK 5.5X32 (Screw) IMPLANT
SCREW PERI LOCK 5.5X36 (Screw) IMPLANT
SCREW PERIPHERAL 5.5X20 LOCK (Screw) IMPLANT
SLING ARM FOAM STRAP LRG (SOFTGOODS) IMPLANT
SLING ARM FOAM STRAP MED (SOFTGOODS) IMPLANT
SPACER SHLD UNI REV 39 +6 (Shoulder) IMPLANT
STEM HUM UNIV REV SHLD 13 (Stem) IMPLANT
STRIP CLOSURE SKIN 1/2X4 (GAUZE/BANDAGES/DRESSINGS) ×2 IMPLANT
SUT MNCRL AB 3-0 PS2 18 (SUTURE) ×2 IMPLANT
SUT MON AB 2-0 CT1 36 (SUTURE) ×2 IMPLANT
SUT VIC AB 1 CT1 36 (SUTURE) ×2 IMPLANT
SUTURE TAPE 1.3 40 TPR END (SUTURE) ×4 IMPLANT
TOWEL GREEN STERILE FF (TOWEL DISPOSABLE) ×2 IMPLANT
TOWEL OR 17X26 10 PK STRL BLUE (TOWEL DISPOSABLE) ×2 IMPLANT
TUBE SUCTION HIGH CAP CLEAR NV (SUCTIONS) ×2 IMPLANT
TUBING CONNECTING 10 (TUBING) ×2 IMPLANT
WATER STERILE IRR 1000ML POUR (IV SOLUTION) ×4 IMPLANT

## 2024-04-21 NOTE — Care Plan (Signed)
 Ortho Bundle Case Management Note  Patient Details  Name: Kelly [name] MRN: [id number] Date of Birth: 07-08-1955  Lt Reverse Shoulder Arthroplasty on 04/21/24  DCP: Home with wife  DME: No needs  PT: [name]                   DME Arranged:  N/A DME Agency:  NA  HH Arranged:    HH Agency:     Additional Comments: Please contact me with any questions of if this plan should need to change.  Burnard Dross, Case Manager [facility] [phone]  Ext. [phone]   04/21/2024, 11:50 AM

## 2024-04-21 NOTE — Anesthesia Postprocedure Evaluation (Signed)
 Anesthesia Post Note  Patient: Chayne G Withington  Procedure(s) Performed: ARTHROPLASTY, SHOULDER, TOTAL, REVERSE (Left: Shoulder)     Patient location during evaluation: PACU Anesthesia Type: General Level of consciousness: awake and alert, oriented and patient cooperative Pain management: pain level controlled Vital Signs Assessment: post-procedure vital signs reviewed and stable Respiratory status: spontaneous breathing, nonlabored ventilation and respiratory function stable Cardiovascular status: blood pressure returned to baseline and stable Postop Assessment: no apparent nausea or vomiting and able to ambulate Anesthetic complications: no Comments: Abbott has checked and adjusted tachytherapy and pacer modality post op, pt stable   No notable events documented.  Last Vitals:  Vitals:   04/21/24 1130 04/21/24 1145  BP: (!) 90/52 101/64  Pulse: 61 63  Resp:    Temp:    SpO2: 93% 95%    Last Pain:  Vitals:   04/21/24 1106  TempSrc: Oral  PainSc: 4                  Millicent Blazejewski,E. Danny Yackley

## 2024-04-21 NOTE — Addendum Note (Signed)
 Addendum  created 04/21/24 1206 by Leonce Athens, MD   Child order released for a procedure order, Clinical Note Signed, Intraprocedure Blocks edited, Intraprocedure Event edited, Intraprocedure Meds edited, SmartForm saved

## 2024-04-21 NOTE — Transfer of Care (Signed)
 Immediate Anesthesia Transfer of Care Note  Patient: Kelly [name]  Procedure(s) Performed: ARTHROPLASTY, SHOULDER, TOTAL, REVERSE (Left: Shoulder)  Patient Location: PACU  Anesthesia Type:General  Level of Consciousness: awake, alert , and oriented  Airway & Oxygen Therapy: Patient Spontanous Breathing  Post-op Assessment: Report given to RN and Post -op Vital signs reviewed and stable  Post vital signs: Reviewed and stable  Last Vitals:  Vitals Value Taken Time  BP 105/54 04/21/24 10:45  Temp 36.4 C 04/21/24 09:47  Pulse 65 04/21/24 10:46  Resp 16 04/21/24 10:46  SpO2 92 % 04/21/24 10:46  Vitals shown include unfiled device data.  Last Pain:  Vitals:   04/21/24 1030  PainSc: 5          Complications: No notable events documented.

## 2024-04-21 NOTE — Discharge Instructions (Signed)
 Kelly [name], M.D., F.A.A.O.S. [facility] Specializing in Arthroscopic and Reconstructive Surgery of the Shoulder [phone] [address] [place], [place] [address] - Fax [phone]    POST-OP REVERSE TOTAL SHOULDER REPLACEMENT INSTRUCTIONS  1. Your first follow up appointment is:  2. The bandage over your incision is waterproof. You may begin showering with this dressing on immediately but do not submerge in a bath tub or hot tub. You may leave this dressing on until first follow up appointment in around 2 weeks. We prefer you leave this dressing in place until follow up however after 5-7 days if you are having itching or skin irritation and would like to remove it you may do so. Go slow and tug at the borders gently to break the bond the dressing has with the skin. At this point if there is no drainage it is okay to go without a bandage or you may cover it with a light gauze and tape. Leave your steri-strips across your incision. You can also expect significant bruising around your shoulder that will drift down your arm and into your chest wall. This is very normal and should resolve over several days. It is also very normal to have some swelling to the operative extremity that will involve the forearm and hand and fingers. It is recommended that if you notice this then try to elevate your hand and fist above your heart and perform gripping exercises several times an hour to help reduce this swelling (see #4).   3. Wear your sling if you are going to be up walking around and when you go to sleep at night. Also protect the arm in the sling until your nerve block has worn off. Then it is ok to remove the sling to perform the exercises below or to occasionally let your arm dangle by your side to stretch your elbow. It is ok to remove your sling if you are sitting in a controlled environment and allow your arm to rest in a position of comfort by your side or on your  lap with pillows to give your neck and skin a break from the sling. You may remove it to allow arm to dangle by side to shower. It is also ok to use your operative extremity to help do light waist level activities and to assist with hygiene and ADL's such as brushing your teeth, getting dressed and toileting. We do ask you refrain from reaching behind your body or back. Also do not use the operative arm to support body weight when getting up or down from bed or a chair.  4. Range of motion to your elbow, wrist, and hand are encouraged 3-5 times daily. Exercise to your hand and fingers helps to reduce swelling you may experience. If you have a foam ball you can use this. If not you can simply use a washcloth to squeeze.   5. Utilize the ice machine as much as possible over the first several days, but it is OK to remove to get up to walk around several times a day to stretch your legs and to go to the bathroom. When in the ice machine, please make sure there is a layer of clothing between the sleeve and your skin and check your skin frequently to make sure there is no redness or skin irritation, if you notice this then give your skin a break from the ice machine for several hours. After the first 3-4 days you  can gradually reduce how much time you spend in the ice machine and adjust to your level of pain. In general it is a good rule of thumb that if you are experiencing pain you should be using the ice machine.  6. Prescriptions for a pain medication and a muscle relaxant are provided for you. It is recommended that if you are experiencing pain that your pain medication alone is not controlling, add the muscle relaxant along with the pain medication which can give additional pain relief. The first 1-2 days after your block has worn off is generally the most severe of your pain and then should gradually decrease. As your pain lessens it is recommended that you decrease your use of the pain medications to an "as  needed basis'" only and to always comply with the recommended dosages of the pain medications. It is also ok to utilize over the counter form of pain medicines such as acetaminophen instead of the prescription pain medications if your pain is only mild. But do not use the prescription pain med and acetaminophen at the same time, it is either one or the other, not both.  7. Pain medications can produce constipation along with their use. If you experience this, the use of an over the counter stool softener or laxative daily is recommended.   8. For additional questions or concerns, please do not hesitate to call the office. If after hours there is an answering service to forward your concerns to the physician or physician assistant on call.  9. Pain control following an exparel nerve block:  To help control your post-operative pain you received a nerve block  performed with Exparel which is a long acting anesthetic (numbing agent) which can provide pain relief and sensations of numbness (and relief of pain) in the operative shoulder and arm for up to 3 days. Sometimes it provides mixed relief, meaning you may still have numbness in certain areas of the arm but can still be able to move parts of that arm, hand, and fingers. We recommend that your prescribed pain medications be used as needed. We do not feel it is necessary to "pre medicate" and "stay ahead" of pain.  Taking narcotic pain medications when you are not having any pain can lead to unnecessary and potentially dangerous side effects.  While the effects of the nerve block are present, please be aware that while you do not have sensation we recommend you pay careful attention to keep your arm positioned in a protective way. Also if you have a sling that has a "thumb loop" that helps to keep the sling from sliding backwards, make sure you remove the loop to avoid a constant pressure to the thumb which can cause some nerve damage or skin  breakdown.  10. Most people find it more comfortable to sleep in a semi upright position or in a recliner with the arm supported. Again, we do recommend you wear your sling to sleep. It is also ok to try to sleep lying flat in bed with a pillow behind your arm to keep it from sliding or falling backwards. If you are trying to sleep on the non-operative side, please use pillows to position your arm so that it does not slide forwards or backwards. It is very common that sleeping and getting into a comfortable position is difficult after shoulder surgery, this should improve with time.  11. Dressing - It is recommended you wear shirts that are loose or button up the  front. To put shirt on allow operative arm to dangle by your side and slide the shirt on that arm, then your head and then the non-operative arm. To remove the shirt do this sequence in reverse. We recommend you wear the sling over top of your shirt to prevent skin irritation. If you notice irritation in your axilla (arm pit) please try to elevate arm away from your body to allow air to get in this area and consider use of an over the counter ointment such as hydrocortisone or simple lotion.  FOR ADDITIONAL INFO ON THE [facility] ICE MACHINE AND INSTRUCTIONS GO TO THE WEBSITE AT  [email or web address]   I  12.  We recommend that you avoid any dental work or cleaning in the first 3 months following your joint replacement. This is to help minimize the possibility of infection from the bacteria in your mouth that enters your bloodstream during dental work. We also recommend that you take an antibiotic prior to your dental work for the first year after your shoulder replacement to further help reduce that risk. Please simply contact our office for antibiotics to be sent to your pharmacy prior to dental work.  13. Post Op Exercises: Please see attached sheet with diagram

## 2024-04-21 NOTE — Op Note (Signed)
 04/21/2024  10:18 AM  PATIENT:   Kelly [name]  68 y.o. male  PRE-OPERATIVE DIAGNOSIS:  Left shoulder rotator cuff tear arthropathy  POST-OPERATIVE DIAGNOSIS: Same  PROCEDURE: Left shoulder reverse arthroplasty lysing a press-fit size 13 Arthrex short stem humeral implant with a neutral metathesis, +6 spacer, +3 polyethylene insert, 39/+4 glenosphere and a small/+2 baseplate  SURGEON:  Riata Ikeda, Franky BATTLE M.D.  ASSISTANTS: Randine Ricks, PA-C  Randine Ricks, PA-C was utilized as an geophysicist/field seismologist throughout this case, essential for help with positioning the patient, positioning extremity, tissue manipulation, implantation of the prosthesis, suture management, wound closure, and intraoperative decision-making.  ANESTHESIA:   General Endotracheal and interscalene block with Exparel  EBL: 200 cc  SPECIMEN: None  Drains: None   PATIENT DISPOSITION:  PACU - hemodynamically stable.    PLAN OF CARE: Discharge to home after PACU  Brief history:  Patient is a 68 year old gentleman with a long history of progressively increasing left shoulder pain which is now causing significant functional limitations and severely impacting his quality of life.  He does have a remote history of open rotator cuff repair.  He is brought to the operating room today for planned left shoulder reverse arthroplasty.  Preoperatively, I counseled the patient regarding treatment options and risks versus benefits thereof.  Possible surgical complications were all reviewed including potential for bleeding, infection, neurovascular injury, persistent pain, loss of motion, anesthetic complication, failure of the implant, and possible need for additional surgery. They understand and accept and agrees with our planned procedure.   Procedure detail:  After undergoing routine preop evaluation the patient received prophylactic antibiotics and interscalene block with Exparel established in the holding area by the anesthesia  department.  Subsequently placed spine on the operating table and underwent the smooth induction of a general endotracheal anesthesia.  Placed in the beachchair position and appropriately padded and protected.  The left shoulder girdle region was sterilely prepped and draped in standard fashion.  Timeout was called.  Patient does have an implantable defibrillator/pacemaker which was draped out of the field and care was taken to protect its position in the anterior chest wall.  A deltopectoral approach was then made to the left shoulder through an approximately 8 cm incision.  Skin flaps elevated dissection carried deep in the deltopectoral interval was then developed from proximal to distal with the vein taken laterally.  Adhesions were then divided beneath the deltoid and the conjoined tendon was mobilized and retracted medially.  The long head biceps tendon was then tenodesed at the upper border of the pectoralis major tendon with the proximal segment unroofed and excised.  The superior remnant of the rotator cuff was then split from the apex of the bicipital groove to the base of the coracoid and the subscapularis was separated from the lesser tuberosity using electrocautery and the free margin was then tagged with a pair of grasping suture tape sutures.  Capsular attachments were then divided from the anterior and inferior margins of the humeral neck and humeral head was then delivered through the wound.  An extra medullary guide was then used to outline the proposed humeral head resection which we performed with an oscillating saw at approximately 20 degrees of retroversion.  Marginal osteophytes were removed with a rondure and a metal Was then placed at the cut proximal humeral surface.  The glenoid was then exposed and a circumferential labral resection was performed.  A guidepin directed into the center of the glenoid and the glenoid was then reamed  with the central followed by the peripheral reamer to a  stable subchondral bony bed in preparation then completed with the central drill and tapped for a 30 mm lag screw.  Our baseplate was then assembled and inserted with vancomycin  powder applied to the threads of the lag screw and excellent fixation was achieved.  The peripheral locking screws were all then placed using standard technique with excellent fixation.  A 39/+4 glenosphere was then impacted onto the baseplate and the central locking screw was placed.  We then we turned our attention back to the humeral shaft and the canal was opened and we ultimately broached up to a size 13 short stem at approximate 20 degrees of retroversion.  A neutral metaphyseal reaming guide was then used to prepare the metaphysis.  A trial implant was then placed and a trial reduction showed good motion stability and soft tissue balance.  The trial was then removed.  The canal was irrigated cleaned and dried with vancomycin  powder applied in the canal.  A final implant was assembled and impacted with excellent fixation.  A series of trial reductions was then performed and we ultimately felt that a total of +9 off the implant give us  the best motion stability and soft tissue balance.  Trials were then removed.  A +6 spacer and then +3 poly was attached to the humeral stem and our final reduction showed excellent motion stability and soft tissue balance.  Final irrigation was completed.  Hemostasis was obtained.  The subscapularis was mobilized and demonstrated good elasticity and was repaired back to the eyelets on the collar at the implant.  Balance of the vancomycin  powder was then sprayed liberally throughout the deep soft tissue planes.  The deltopectoral pectoral interval was reapproximated with a series of figure-of-eight and 1 Vicryl sutures.  2-0 Monocryl used to close the subcu layer and intracuticular 3-0 Monocryl used to close the skin followed by Steri-Strips and Aquacel dressing.  The left arm placed into a sling.  The  patient was awakened, extubated, and taken to the recovery room in stable condition.  Franky CHRISTELLA Pointer MD   Contact # [phone]

## 2024-04-21 NOTE — Anesthesia Procedure Notes (Signed)
 Procedure Name: Intubation Date/Time: 04/21/2024 8:56 AM  Performed by: Gladis Honey, CRNAPre-anesthesia Checklist: Patient identified, Emergency Drugs available, Suction available and Patient being monitored Patient Re-evaluated:Patient Re-evaluated prior to induction Oxygen Delivery Method: Circle System Utilized Preoxygenation: Pre-oxygenation with 100% oxygen Induction Type: IV induction Ventilation: Mask ventilation without difficulty Laryngoscope Size: Miller and 2 Grade View: Grade I Tube type: Oral Tube size: 7.5 mm Number of attempts: 1 Airway Equipment and Method: Stylet and Oral airway Placement Confirmation: ETT inserted through vocal cords under direct vision, positive ETCO2 and breath sounds checked- equal and bilateral Secured at: 22 cm Tube secured with: Tape Dental Injury: Teeth and Oropharynx as per pre-operative assessment

## 2024-04-21 NOTE — H&P (Signed)
 Kelly [name]    Chief Complaint: Left shoulder rotator cuff tear arthropathy HPI: The patient is a 68 y.o. male with chronic and progressive increasing left shoulder pain related to severe rotator cuff tear arthropathy.  Due to his increasing functional limitations and failure to respond to prolonged attempts at conservative management, he is brought to the operating room today for planned left shoulder reverse arthroplasty.  Past Medical History:  Diagnosis Date   AICD (automatic cardioverter/defibrillator) present    Anemia    Anxiety    Aortic stenosis    Arthritis    Atrial fibrillation [facility])    Atrial fibrillation (HCC)    CHF (congestive heart failure) (HCC)    Chronic pain    Cirrhosis (HCC)    Coronary atherosclerosis of native coronary artery    BMS LAD and PTCA RCA 2000,BMS RCA/PLA 2001, CABG June 2018   Depression    Diverticulitis    Essential hypertension    Fatty liver    Gastric ulcer    GERD (gastroesophageal reflux disease)    Headache    Hyperlipidemia    Low-grade NHL (non-Hodgkin's lymphoma) 2007   Lupus anticoagulant positive    MI (myocardial infarction) (HCC) 2001   Pacemaker    Paresthesia of upper limb    Peripheral neuropathy    Peripheral vascular disease    blood clots in legs   Sleep apnea    no longer using CPAP   Thrombocytopenia    Trifascicular block    Type 2 diabetes mellitus (HCC)    Umbilical hernia       Past Surgical History:  Procedure Laterality Date   Axillary abscess     left incision and drainage left   BACK SURGERY     multiple   BIOPSY  08/15/2014   Procedure: BIOPSY;  Surgeon: Margo LITTIE Haddock, MD;  Location: [facility] ORS;  Service: Endoscopy;;  Gastric   BIOPSY  01/19/2015   Procedure: BIOPSY (GASTRIC ULCER);  Surgeon: Margo LITTIE Haddock, MD;  Location: [facility];  Service: Endoscopy;;   CARDIAC CATHETERIZATION     cardiac stents     x5 stents   CATARACT EXTRACTION Bilateral    COLONOSCOPY  10/2006   Dr. Reyes Medoff:  Mild left colonic diverticulosis   COLONOSCOPY WITH PROPOFOL  N/A 02/06/2023   Procedure: COLONOSCOPY WITH PROPOFOL ;  Surgeon: Cindie Carlin POUR, DO;  Location: [facility];  Service: Endoscopy;  Laterality: N/A;  8:45 am, asa 3   CORONARY ARTERY BYPASS GRAFT N/A 11/11/2016   Procedure: CORONARY ARTERY BYPASS GRAFTING (CABG) x 3, USING LEFT MAMMARY ARTERY AND RIGHT GREATER SAPHENOUS VEIN HARVESTED ENDOSCOPICALLY;  Surgeon: Fleeta Hanford Coy, MD;  Location: [facility] OR;  Service: Open Heart Surgery;  Laterality: N/A;   CORONARY PRESSURE/FFR STUDY N/A 09/26/2016   Procedure: Intravascular Pressure Wire/FFR Study;  Surgeon: Mady Bruckner, MD;  Location: [facility] INVASIVE CV LAB;  Service: Cardiovascular;  Laterality: N/A;   Dental extractions     ESOPHAGOGASTRODUODENOSCOPY  10/2006   Dr. Reyes Medoff: Gastric ulcer, antral, biopsy with reactive changes associated glandular atrophy, no H pylori. No features of lymphoma. Likely NSAID related   ESOPHAGOGASTRODUODENOSCOPY  12/2006   Dr. Reyes Medoff: Complete healing of gastric ulcers.   ESOPHAGOGASTRODUODENOSCOPY (EGD) WITH PROPOFOL  N/A 08/15/2014   SLF: 1. Abnormal pain due to ulcers & gastriitis    ESOPHAGOGASTRODUODENOSCOPY (EGD) WITH PROPOFOL  N/A 01/19/2015   SLF: 1. Persistant ulcer with firm base 2. single gastric polyp found in the gastric antrum. Ulcer  base    ESOPHAGOGASTRODUODENOSCOPY (EGD) WITH PROPOFOL  N/A 02/06/2023   Procedure: ESOPHAGOGASTRODUODENOSCOPY (EGD) WITH PROPOFOL ;  Surgeon: Cindie Carlin POUR, DO;  Location: [facility];  Service: Endoscopy;  Laterality: N/A;   EUS N/A 03/21/2015   Thickened gastric wall in pre-pyloric antrum, no obvious intramural mass, fatty pancreas, no obvious pancreatic mass.    Inguinal lymph node biopsy     right inguinal lymph node biopsy   LEAD EXTRACTION N/A 11/14/2022   Procedure: LEAD EXTRACTION;  Surgeon: Cindie Ole DASEN, MD;  Location: [facility] INVASIVE CV LAB;  Service: Cardiovascular;  Laterality:  N/A;   LEFT HEART CATH AND CORONARY ANGIOGRAPHY N/A 09/26/2016   Procedure: Left Heart Cath and Coronary Angiography;  Surgeon: Mady Bruckner, MD;  Location: [facility] INVASIVE CV LAB;  Service: Cardiovascular;  Laterality: N/A;   LEFT HEART CATH AND CORS/GRAFTS ANGIOGRAPHY N/A 06/29/2020   Procedure: LEFT HEART CATH AND CORS/GRAFTS ANGIOGRAPHY;  Surgeon: Wonda Sharper, MD;  Location: [facility] INVASIVE CV LAB;  Service: Cardiovascular;  Laterality: N/A;   NECK SURGERY     PACEMAKER IMPLANT N/A 03/12/2020   Procedure: PACEMAKER IMPLANT;  Surgeon: Cindie Ole DASEN, MD;  Location: [facility] INVASIVE CV LAB;  Service: Cardiovascular;  Laterality: N/A;   POLYPECTOMY  01/19/2015   Procedure: POLYPECTOMY (GASTRIC);  Surgeon: Margo LITTIE Haddock, MD;  Location: [facility];  Service: Endoscopy;;   ROUX-EN-Y GASTRIC BYPASS     RNY gastric bypass   SHOULDER ARTHROSCOPY Left    torn rotator cuff   TEE WITHOUT CARDIOVERSION N/A 11/11/2016   Procedure: TRANSESOPHAGEAL ECHOCARDIOGRAM (TEE);  Surgeon: Fleeta Ochoa, Maude, MD;  Location: [facility] OR;  Service: Open Heart Surgery;  Laterality: N/A;    Family History  Problem Relation Age of Onset   Pancreatic cancer Maternal Grandmother    Obesity Mother    Hypertension Mother    Diabetes Father    Thyroid  disease Father    Heart disease Father    Colon cancer Neg Hx     Social History:  reports that he quit smoking about 30 years ago. His smoking use included cigarettes. He started smoking about 53 years ago. He has a 23 pack-year smoking history. He has quit using smokeless tobacco.  His smokeless tobacco use included snuff and chew. He reports that he does not drink alcohol and does not use drugs.  BMI: Estimated body mass index is 23.13 kg/m as calculated from the following:   Height as of this encounter: 6' 4 (1.93 m).   Weight as of this encounter: 86.2 kg.  Lab Results  Component Value Date   ALBUMIN  3.4 (L) 01/14/2024   Diabetes:   Patient has a diagnosis of  diabetes,  Lab Results  Component Value Date   HGBA1C 7.2 (H) 04/11/2024   Smoking Status:       Medications Prior to Admission  Medication Sig Dispense Refill   acetaminophen  (TYLENOL ) 500 MG tablet Take 500-1,000 mg by mouth every 6 (six) hours as needed (for pain.).     Ascorbic Acid (VITAMIN C) 500 MG CAPS Take 500 mg by mouth daily.     aspirin  EC 81 MG tablet Take 1 tablet (81 mg total) by mouth daily. 30 tablet 11   B Complex-C (B-COMPLEX WITH VITAMIN C) tablet Take 1 tablet by mouth daily.     Cholecalciferol (VITAMIN D3) 50 MCG (2000 UT) TABS Take 2,000 Units by mouth daily.     dapagliflozin propanediol (FARXIGA) 10 MG TABS tablet Take 10 mg by mouth daily.  enoxaparin  (LOVENOX ) 150 MG/ML injection Inject 1 mL (150 mg total) into the skin daily. 10 mL 1   ferrous sulfate  325 (65 FE) MG tablet Take 325 mg by mouth 2 (two) times daily.     FLUoxetine  (PROZAC ) 10 MG capsule Take 10 mg by mouth daily.     gabapentin  (NEURONTIN ) 600 MG tablet Take 600 mg by mouth at bedtime.     isosorbide  mononitrate (IMDUR ) 30 MG 24 hr tablet Take 0.5 tablets (15 mg total) by mouth daily. 45 tablet 3   levocetirizine (XYZAL) 5 MG tablet Take 5 mg by mouth every evening.     lipase/protease/amylase (CREON ) 36000 UNITS CPEP capsule Take 2 capsules (72,000 Units total) by mouth 3 (three) times daily with meals. May also take 1 capsule (36,000 Units total) as needed (with snacks - up to 4 snacks daily). 300 capsule 11   Magnesium  250 MG TABS Take 250 mg by mouth See admin instructions. Take 250 mg in the morning, may take another 250 mg dose up to 3 more times a day as needed for muscle cramps     meclizine  (ANTIVERT ) 25 MG tablet Take 25 mg by mouth 3 (three) times daily as needed for dizziness.     Menthol-Methyl Salicylate (MUSCLE RUB) 10-15 % CREA Apply 1 Application topically as needed for muscle pain.     Multiple Vitamin (MULTIVITAMIN WITH MINERALS) TABS tablet Take 1 tablet by mouth in the  morning and at bedtime.     nitroGLYCERIN  (NITROSTAT ) 0.4 MG SL tablet PLACE 1 TABLET BY MOUTH UNDER THE TONGUE EVERY 5 MINS X 3DOSES AS NEEDED FOR CHEST PAIN 25 tablet 3   Oxycodone  HCl 10 MG TABS Take 10 mg by mouth 2 (two) times daily as needed (pain).     pantoprazole  (PROTONIX ) 40 MG tablet TAKE 1 TABLET BY MOUTH DAILY BEFORE BREAKFAST 90 tablet 0   Pediatric Multivitamins-Iron (FLINTSTONES COMPLETE PO) Take 1 tablet by mouth daily.     rosuvastatin  (CRESTOR ) 20 MG tablet Take 1 tablet (20 mg total) by mouth daily. 90 tablet 3   sacubitril-valsartan (ENTRESTO ) 24-26 MG TAKE 1 TABLET BY MOUTH TWICE DAILY 60 tablet 5   TRESIBA FLEXTOUCH 100 UNIT/ML FlexTouch Pen Inject 10-30 Units into the skin daily.     glucose blood (ONETOUCH VERIO) test strip USE AS DIRECTED FOUR- FIVE TIMES DAILY 450 each 1   ONETOUCH VERIO test strip USE TO TEST FOUR TIMES DAILY AS DIRECTED 450 each 2     Physical Exam: Left shoulder demonstrates painful and guarded motion as noted at his recent office visits.  He has globally decreased strength to manual muscle testing.  Examination otherwise as noted at his recent office visit.  Imaging studies confirm changes consistent with chronic left shoulder rotator cuff tear arthropathy.  Vitals  Temp:  [97.9 F (36.6 C)] 97.9 F (36.6 C) (12/04 0601) Pulse Rate:  [64] 64 (12/04 0601) Resp:  [18] 18 (12/04 0601) BP: (114)/(68) 114/68 (12/04 0601) SpO2:  [98 %] 98 % (12/04 0601) Weight:  [86.2 kg] 86.2 kg (12/04 0601)  Assessment/Plan  Impression: Left shoulder rotator cuff tear arthropathy  Plan of Action: Procedure(s): ARTHROPLASTY, SHOULDER, TOTAL, REVERSE  Hoyt Leanos M Natha Guin 04/21/2024, 6:33 AM Contact # [phone]

## 2024-04-21 NOTE — Evaluation (Signed)
 Occupational Therapy Evaluation Patient Details Name: Kelly [name] MRN: [id number] DOB: 1955/06/19 Today's Date: 04/21/2024   History of Present Illness   Patient is a 68 year old gentleman with a long history of progressively increasing left shoulder pain which is now causing significant functional limitations and severely impacting his quality of life. Pt has a  remote history of open rotator cuff repair.  He is now s/p left shoulder reverse arthroplasty.     Clinical Impressions Pt s/p L reverse TSA and requires min A with all bathing, dressing tasks due to L UE retsrictions/sling. PTA pt lives with his wife, was Ind with ADLs, IADLs, drives, works and doe snot use an AD for mobility;  reports that he has difficulty with standing from recliner sometimes at home. Pt currently able to stand and walk CGA STS from recliner in PACU, Ind walking. Pt and wife instructed on NWB, ADL techniques, sling wear, per MD pt ok to use L UE for self feeding, hygiene, ADLs as tolerated, L UE AROM hand, wrist and elbow, pendulums, lap slides, ER 20, ABD 45, FF 60 with handouts provided, pt and wife verbalize and demo understanding. All education has been completed and no further acute OT services are indicated at this time     If plan is discharge home, recommend the following:   A little help with bathing/dressing/bathroom;Assist for transportation;Help with stairs or ramp for entrance     Functional Status Assessment   Patient has had a recent decline in their functional status and demonstrates the ability to make significant improvements in function in a reasonable and predictable amount of time.     Equipment Recommendations   None recommended by OT     Recommendations for Other Services         Precautions/Restrictions   Precautions Precautions: Shoulder Shoulder Interventions: Shoulder sling/immobilizer;At all times;Off for dressing/bathing/exercises Precaution Booklet Issued:  Yes (comment) Restrictions Weight Bearing Restrictions Per Provider Order: Yes LUE Weight Bearing Per Provider Order: Non weight bearing     Mobility Bed Mobility               General bed mobility comments: in chair in PACU    Transfers Overall transfer level: Modified independent Equipment used: Ambulation equipment used                      Balance Overall balance assessment: Mild deficits observed, not formally tested, History of Falls                                         ADL either performed or assessed with clinical judgement   ADL Overall ADL's : Needs assistance/impaired                                       General ADL Comments: pt requires min A with all bathing, dressing tasks due to L UE retsrictions/sling. Pt and wife educated on ADL techniques, sling wear, per MD pt ok to use L UE for self feeding, hygiene, ADLs as tolerated, handout info provided per surgeon     Vision Ability to See in Adequate Light: 0 Adequate Patient Visual Report: No change from baseline       Perception         Praxis  Pertinent Vitals/Pain Pain Assessment Pain Assessment: No/denies pain     Extremity/Trunk Assessment Upper Extremity Assessment Upper Extremity Assessment: Right hand dominant;LUE deficits/detail LUE Deficits / Details: NWB LUE: Unable to fully assess due to immobilization           Communication Communication Communication: No apparent difficulties Factors Affecting Communication: Hearing impaired   Cognition   Behavior During Therapy: WFL for tasks assessed/performed Cognition: No apparent impairments                               Following commands: Intact       Cueing  General Comments          Exercises Other Exercises Other Exercises: L UE AROM hand, wrist and elbow, pendulums, lap slides, ER 20, ABD 45, FF 60 with handouts provided. Pt and wife demo  understanding Other Exercises: Ice machine edcuation, pt and wife demo understanding, handout provided   Shoulder Instructions Shoulder Instructions Donning/doffing shirt without moving shoulder: Minimal assistance Method for sponge bathing under operated UE: Minimal assistance Donning/doffing sling/immobilizer: Minimal assistance Correct positioning of sling/immobilizer: Independent Pendulum exercises (written home exercise program): Supervision/safety ROM for elbow, wrist and digits of operated UE: Independent Sling wearing schedule (on at all times/off for ADL's): Independent Proper positioning of operated UE when showering: Independent Positioning of UE while sleeping: Independent    Home Living Family/patient expects to be discharged to:: Private residence Living Arrangements: Spouse/significant other Available Help at Discharge: Family;Available 24 hours/day   Home Access: Stairs to enter;Ramped entrance   Entrance Stairs-Rails: Left Home Layout: Full bath on main level;Able to live on main level with bedroom/bathroom     Bathroom Shower/Tub: Walk-in shower;Tub/shower unit   Bathroom Toilet: Handicapped height     Home Equipment: Shower seat          Prior Functioning/Environment Prior Level of Function : Independent/Modified Independent;Working/employed;Driving             Mobility Comments: no AD for mobility ADLs Comments: Ind with ADLs, IADLs    OT Problem List: Decreased range of motion;Decreased coordination;Decreased strength;Impaired UE functional use   OT Treatment/Interventions:        OT Goals(Current goals can be found in the care plan section)   Acute Rehab OT Goals Patient Stated Goal: go home OT Goal Formulation: All assessment and education complete, DC therapy   OT Frequency:       Co-evaluation              AM-PAC OT 6 Clicks Daily Activity     Outcome Measure Help from another person eating meals?: None Help from  another person taking care of personal grooming?: A Little Help from another person toileting, which includes using toliet, bedpan, or urinal?: A Little Help from another person bathing (including washing, rinsing, drying)?: A Little Help from another person to put on and taking off regular upper body clothing?: A Little Help from another person to put on and taking off regular lower body clothing?: A Little 6 Click Score: 19   End of Session Nurse Communication: Other (comment);Mobility status (L UE education per surgeon provided)  Activity Tolerance: Patient tolerated treatment well Patient left: in chair;with family/visitor present  OT Visit Diagnosis: Muscle weakness (generalized) (M62.81)                Time: 8780-8741 OT Time Calculation (min): 39 min Charges:  OT General Charges $OT Visit: 1 Visit OT  Evaluation $OT Eval Low Complexity: 1 Low OT Treatments $Self Care/Home Management : 8-22 mins $Therapeutic Exercise: 8-22 mins    Jacques Karna Loose 04/21/2024, 1:15 PM

## 2024-04-21 NOTE — Anesthesia Procedure Notes (Signed)
 Anesthesia Regional Block: Interscalene brachial plexus block   Pre-Anesthetic Checklist: , timeout performed,  Correct Patient, Correct Site, Correct Laterality,  Correct Procedure, Correct Position, site marked,  Risks and benefits discussed,  Surgical consent,  Pre-op  evaluation,  At surgeon's request and post-op pain management  Laterality: Left and Upper  Prep: chloraprep       Needles:  Injection technique: Single-shot  Needle Type: Echogenic Needle     Needle Length: 9cm  Needle Gauge: 21     Additional Needles:   Procedures:,,,, ultrasound used (permanent image in chart),,    Narrative:  Start time: 04/21/2024 7:10 AM End time: 04/21/2024 7:16 AM Injection made incrementally with aspirations every 5 mL.  Performed by: Personally  Anesthesiologist: Leonce Athens, MD  Additional Notes: Pt identified in Holding room.  Monitors applied. Working IV access confirmed. Timeout, Sterile prep L clavicle and neck.  #21ga ECHOgenic Arrow block needle to interscalene brachial plexus with US  guidance.  10cc 0.5% Bupivacaine 1:200k epi, Exparel injected incrementally after negative test dose.  Patient asymptomatic, VSS, no heme aspirated, tolerated well.   Kelly [name] Leonce, MD

## 2024-04-22 ENCOUNTER — Encounter (HOSPITAL_COMMUNITY): Payer: Self-pay | Admitting: Orthopedic Surgery

## 2024-04-27 ENCOUNTER — Ambulatory Visit: Payer: Self-pay | Admitting: Student in an Organized Health Care Education/Training Program

## 2024-04-27 ENCOUNTER — Ambulatory Visit: Attending: Cardiology

## 2024-04-27 DIAGNOSIS — Z5181 Encounter for therapeutic drug level monitoring: Secondary | ICD-10-CM | POA: Diagnosis not present

## 2024-04-27 DIAGNOSIS — I480 Paroxysmal atrial fibrillation: Secondary | ICD-10-CM | POA: Diagnosis not present

## 2024-04-27 LAB — POCT INR: INR: 2 (ref 2.0–3.0)

## 2024-04-27 NOTE — Progress Notes (Signed)
 INR 2.0 Please see anticoagulation encounter.

## 2024-04-27 NOTE — Patient Instructions (Signed)
 Continue warfarin 1 1/2 tablets on tablet daily except 1 tablet on Sundays S/P Rt shoulder Arthroplasty on 04/21/24 Stop Lovenox  shots Recheck on 05/17/24

## 2024-05-16 ENCOUNTER — Ambulatory Visit: Payer: Medicare PPO

## 2024-05-16 DIAGNOSIS — I480 Paroxysmal atrial fibrillation: Secondary | ICD-10-CM

## 2024-05-17 ENCOUNTER — Ambulatory Visit: Attending: Cardiology | Admitting: *Deleted

## 2024-05-17 DIAGNOSIS — Z5181 Encounter for therapeutic drug level monitoring: Secondary | ICD-10-CM

## 2024-05-17 DIAGNOSIS — I480 Paroxysmal atrial fibrillation: Secondary | ICD-10-CM | POA: Diagnosis not present

## 2024-05-17 LAB — CUP PACEART REMOTE DEVICE CHECK
Battery Remaining Longevity: 46 mo
Battery Remaining Percentage: 73 %
Battery Voltage: 2.98 V
Brady Statistic AP VP Percent: 54 %
Brady Statistic AP VS Percent: 1 %
Brady Statistic AS VP Percent: 46 %
Brady Statistic AS VS Percent: 1 %
Brady Statistic RA Percent Paced: 53 %
Date Time Interrogation Session: 20251229020016
HighPow Impedance: 52 Ohm
HighPow Impedance: 52 Ohm
Implantable Lead Connection Status: 753985
Implantable Lead Connection Status: 753985
Implantable Lead Connection Status: 753985
Implantable Lead Implant Date: 20211025
Implantable Lead Implant Date: 20240628
Implantable Lead Implant Date: 20240628
Implantable Lead Location: 753859
Implantable Lead Location: 753860
Implantable Lead Location: 753860
Implantable Pulse Generator Implant Date: 20240628
Lead Channel Impedance Value: 390 Ohm
Lead Channel Impedance Value: 460 Ohm
Lead Channel Impedance Value: 480 Ohm
Lead Channel Pacing Threshold Amplitude: 0.75 V
Lead Channel Pacing Threshold Amplitude: 2.25 V
Lead Channel Pacing Threshold Amplitude: 3.5 V
Lead Channel Pacing Threshold Pulse Width: 0.05 ms
Lead Channel Pacing Threshold Pulse Width: 0.5 ms
Lead Channel Pacing Threshold Pulse Width: 1 ms
Lead Channel Sensing Intrinsic Amplitude: 1.5 mV
Lead Channel Sensing Intrinsic Amplitude: 11.9 mV
Lead Channel Setting Pacing Amplitude: 0.25 V
Lead Channel Setting Pacing Amplitude: 2 V
Lead Channel Setting Pacing Amplitude: 3.5 V
Lead Channel Setting Pacing Pulse Width: 0.05 ms
Lead Channel Setting Pacing Pulse Width: 0.5 ms
Lead Channel Setting Sensing Sensitivity: 0.5 mV
Pulse Gen Serial Number: 5563291
Zone Setting Status: 755011

## 2024-05-17 LAB — POCT INR: INR: 2.3 (ref 2.0–3.0)

## 2024-05-17 NOTE — Patient Instructions (Signed)
 Continue warfarin 1 1/2 tablets on tablet daily except 1 tablet on Sundays S/P Rt shoulder Arthroplasty on 04/21/24 Recheck in 4 wks

## 2024-05-17 NOTE — Progress Notes (Signed)
 INR-2.3; Please see anticoagulation encounter

## 2024-05-18 ENCOUNTER — Ambulatory Visit: Payer: Self-pay | Admitting: Student in an Organized Health Care Education/Training Program

## 2024-05-24 NOTE — Progress Notes (Signed)
 Remote ICD Transmission

## 2024-05-27 ENCOUNTER — Encounter: Payer: Self-pay | Admitting: Internal Medicine

## 2024-06-16 ENCOUNTER — Other Ambulatory Visit (HOSPITAL_COMMUNITY): Payer: Self-pay

## 2024-06-16 ENCOUNTER — Ambulatory Visit: Admitting: *Deleted

## 2024-06-16 ENCOUNTER — Telehealth: Payer: Self-pay | Admitting: Pharmacy Technician

## 2024-06-16 DIAGNOSIS — I480 Paroxysmal atrial fibrillation: Secondary | ICD-10-CM | POA: Diagnosis not present

## 2024-06-16 DIAGNOSIS — Z5181 Encounter for therapeutic drug level monitoring: Secondary | ICD-10-CM | POA: Diagnosis not present

## 2024-06-16 LAB — POCT INR: INR: 3.2 — AB (ref 2.0–3.0)

## 2024-06-16 MED ORDER — WARFARIN SODIUM 5 MG PO TABS: ORAL_TABLET | ORAL | 5 refills | Status: AC

## 2024-06-16 NOTE — Patient Instructions (Signed)
 Take warfarin 1/2 tablet tonight then resume 1 1/2 tablets on tablet daily except 1 tablet on Sundays S/P Rt shoulder Arthroplasty on 04/21/24 Recheck in 4 wks

## 2024-06-16 NOTE — Telephone Encounter (Signed)
" ° ° °  Insurance will pay for generic. Prescription written that generic is allowed. Lf 06/09/24  "

## 2024-06-16 NOTE — Progress Notes (Signed)
INR 3.2

## 2024-06-17 ENCOUNTER — Telehealth: Payer: Self-pay | Admitting: Cardiology

## 2024-06-17 NOTE — Telephone Encounter (Signed)
 Pt c/o medication issue:  1. Name of Medication:   sacubitril-valsartan (ENTRESTO ) 24-26 MG    2. How are you currently taking this medication (dosage and times per day)? NA  3. Are you having a reaction (difficulty breathing--STAT)? no  4. What is your medication issue? Pt wife states that per insurance medication needs authorization or change. Pt wife would like a c/b regarding this matter. Please advise

## 2024-06-20 ENCOUNTER — Other Ambulatory Visit (HOSPITAL_COMMUNITY): Payer: Self-pay

## 2024-07-13 ENCOUNTER — Ambulatory Visit

## 2024-09-13 ENCOUNTER — Ambulatory Visit: Admitting: Cardiology

## 2025-01-13 ENCOUNTER — Other Ambulatory Visit

## 2025-01-20 ENCOUNTER — Ambulatory Visit: Admitting: Oncology
